# Patient Record
Sex: Male | Born: 1960
Health system: Southern US, Community
[De-identification: ages and names within clinical notes are randomized; demographics above are authoritative.]

## PROBLEM LIST (undated history)

## (undated) DIAGNOSIS — K922 Gastrointestinal hemorrhage, unspecified: Secondary | ICD-10-CM

## (undated) DIAGNOSIS — I10 Essential (primary) hypertension: Secondary | ICD-10-CM

## (undated) DIAGNOSIS — E7800 Pure hypercholesterolemia, unspecified: Secondary | ICD-10-CM

## (undated) DIAGNOSIS — C8290 Follicular lymphoma, unspecified, unspecified site: Secondary | ICD-10-CM

## (undated) DIAGNOSIS — K37 Unspecified appendicitis: Secondary | ICD-10-CM

## (undated) DIAGNOSIS — C8590 Non-Hodgkin lymphoma, unspecified, unspecified site: Secondary | ICD-10-CM

## (undated) DIAGNOSIS — Z87891 Personal history of nicotine dependence: Secondary | ICD-10-CM

## (undated) HISTORY — DX: Essential (primary) hypertension: I10

---
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
## 2000-04-28 ENCOUNTER — Emergency Department (HOSPITAL_COMMUNITY): Admission: EM | Admit: 2000-04-28 | Discharge: 2000-04-28 | Payer: Self-pay | Admitting: *Deleted

## 2008-01-13 ENCOUNTER — Observation Stay (HOSPITAL_COMMUNITY): Admission: EM | Admit: 2008-01-13 | Discharge: 2008-01-14 | Payer: Self-pay | Admitting: Emergency Medicine

## 2008-01-28 ENCOUNTER — Encounter: Admission: RE | Admit: 2008-01-28 | Discharge: 2008-04-27 | Payer: Self-pay | Admitting: *Deleted

## 2008-03-09 ENCOUNTER — Encounter: Payer: Self-pay | Admitting: Gastroenterology

## 2008-03-14 ENCOUNTER — Encounter: Payer: Self-pay | Admitting: Gastroenterology

## 2008-10-25 ENCOUNTER — Ambulatory Visit: Payer: Self-pay | Admitting: Gastroenterology

## 2008-10-26 ENCOUNTER — Telehealth: Payer: Self-pay | Admitting: Gastroenterology

## 2008-11-10 ENCOUNTER — Encounter: Payer: Self-pay | Admitting: Gastroenterology

## 2008-11-16 ENCOUNTER — Ambulatory Visit: Payer: Self-pay | Admitting: Gastroenterology

## 2008-11-16 LAB — CONVERTED CEMR LAB: Creatinine, Ser: 0.7 mg/dL (ref 0.4–1.5)

## 2008-11-17 ENCOUNTER — Encounter: Payer: Self-pay | Admitting: Gastroenterology

## 2008-11-17 ENCOUNTER — Encounter (INDEPENDENT_AMBULATORY_CARE_PROVIDER_SITE_OTHER): Payer: Self-pay | Admitting: *Deleted

## 2008-11-21 ENCOUNTER — Ambulatory Visit: Payer: Self-pay | Admitting: Internal Medicine

## 2008-11-21 IMAGING — CT CT CHEST W/ CM
1 of 3 series · 14 of 30 positions shown, 18 images · IV contrast (agent unspecified)
Comparison: None

CT CHEST

CLINICAL DATA: Abdominal pain, history of prior CT showing
adenopathy.  No prior CT is currently available for comparison.

CT CHEST, ABDOMEN AND PELVIS WITH CONTRAST
TECHNIQUE: Multidetector CT imaging of the chest, abdomen and
pelvis was performed following the standard protocol during bolus
administration of intravenous contrast.
Contrast: 125 ml [SD]

[Series 2: cap 5.0 b40f st · axial · 0.69mm/px · z∈[-602,-98]mm · 14 of 119 slices shown, 18 images]
[im 9/119  mediastinal]
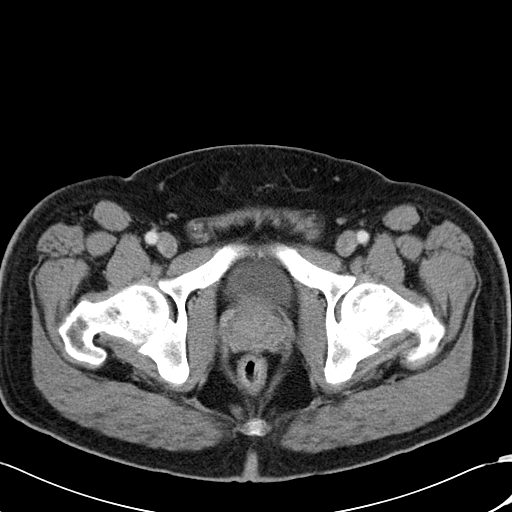
[im 9/119  lung]
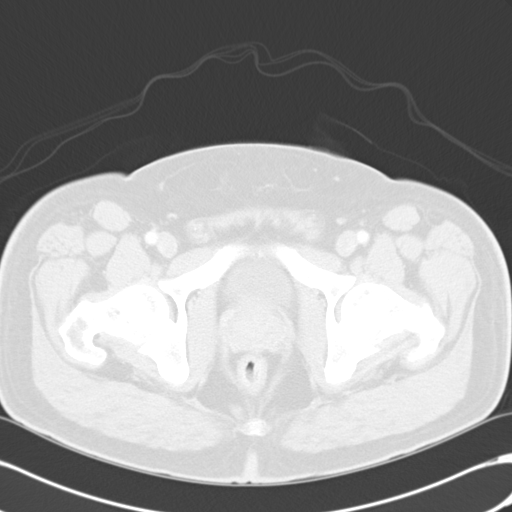
[im 17/119  lung]
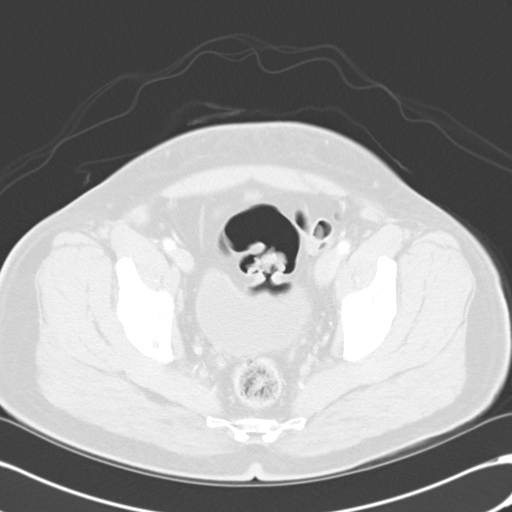
[im 26/119  lung]
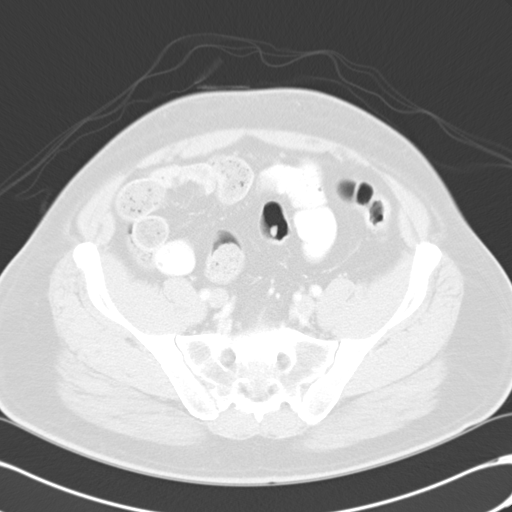
[im 34/119  lung]
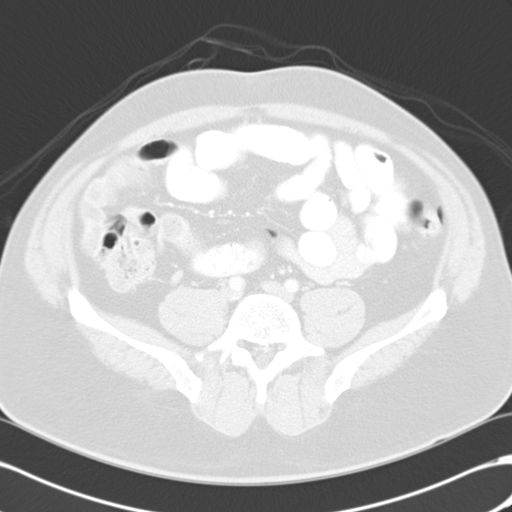
[im 43/119  mediastinal]
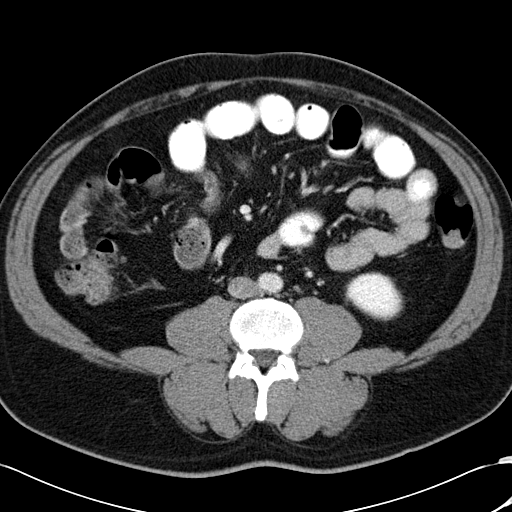
[im 43/119  lung]
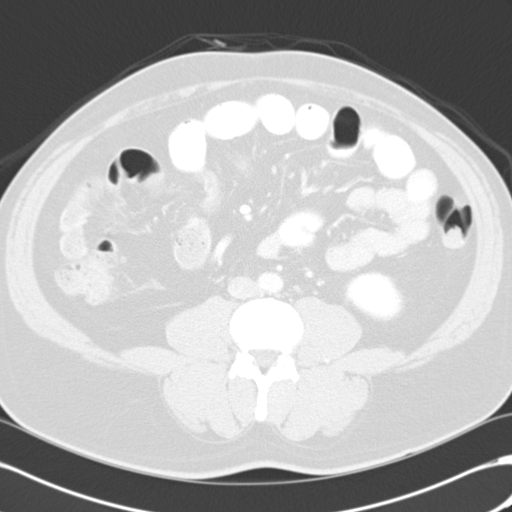
[im 51/119  lung]
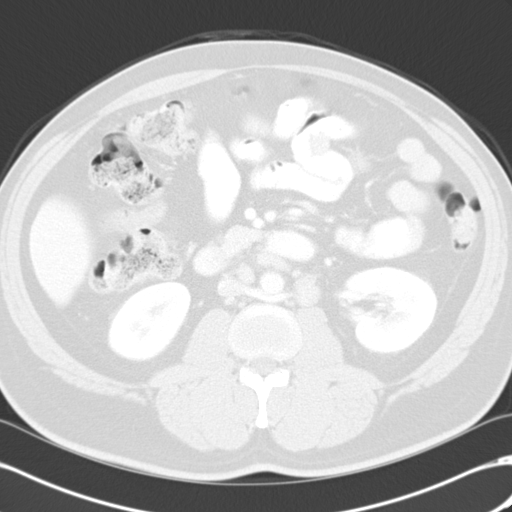
[im 58/119  lung]
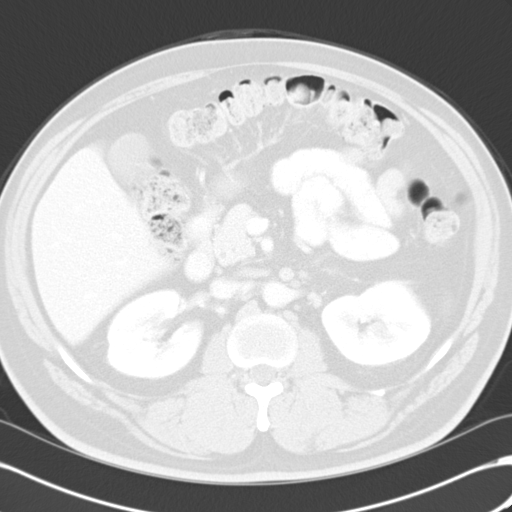
[im 60/119  lung]
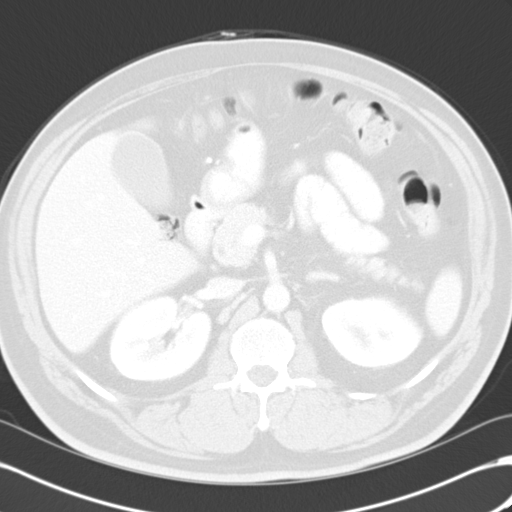
[im 68/119  mediastinal]
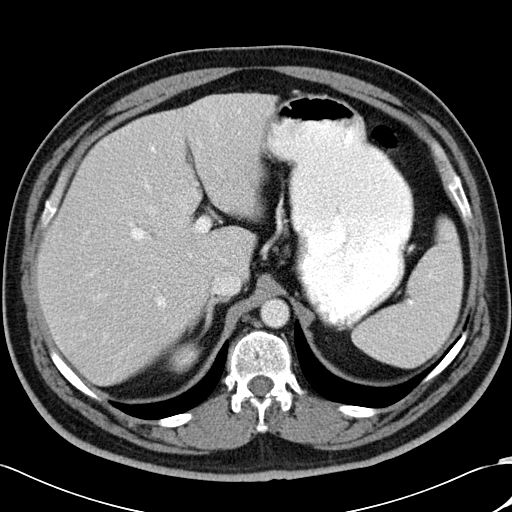
[im 68/119  lung]
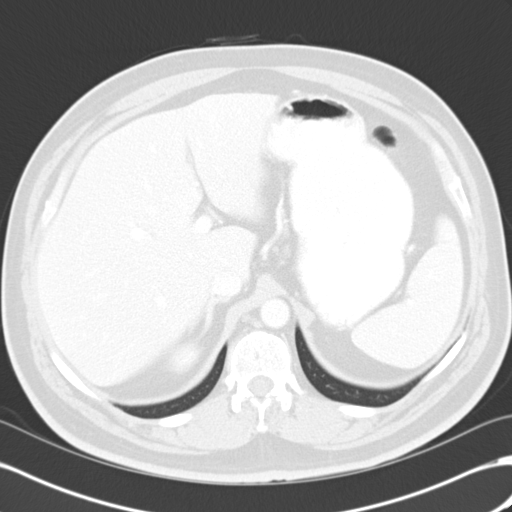
[im 76/119  lung]
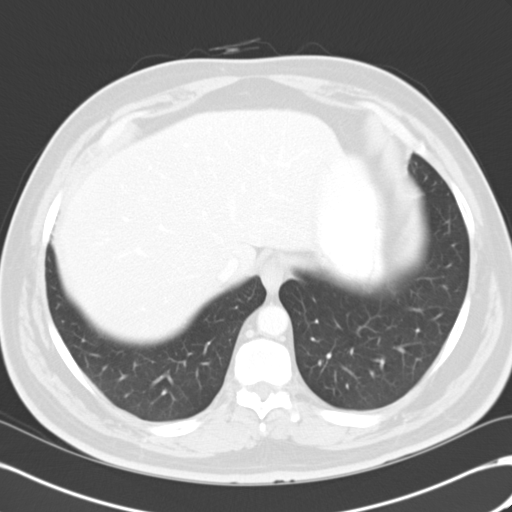
[im 85/119  lung]
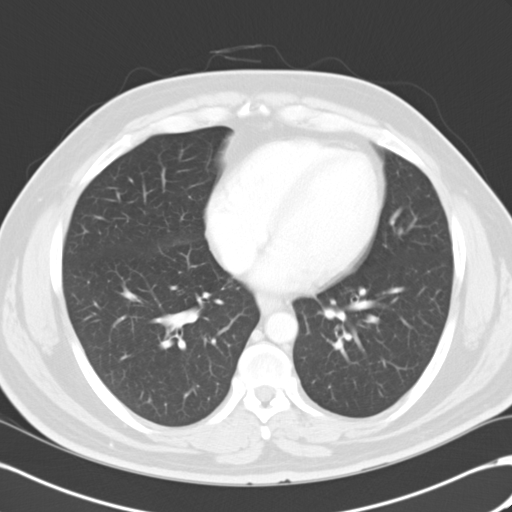
[im 93/119  lung]
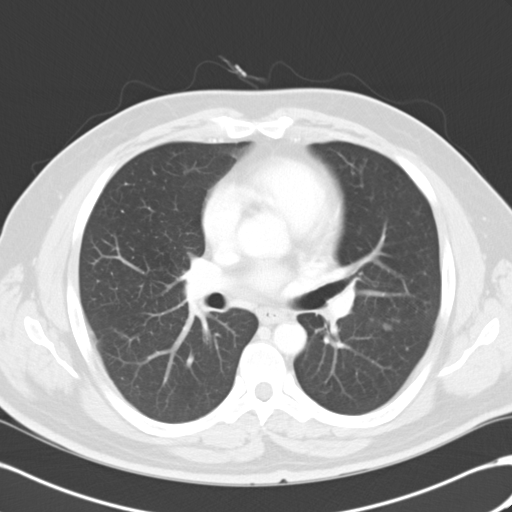
[im 102/119  mediastinal]
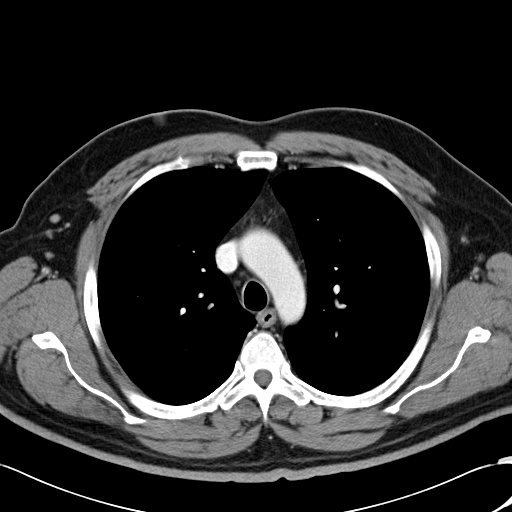
[im 102/119  lung]
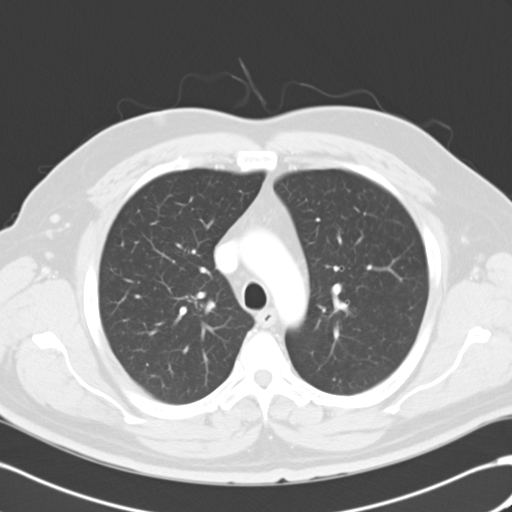
[im 110/119  lung]
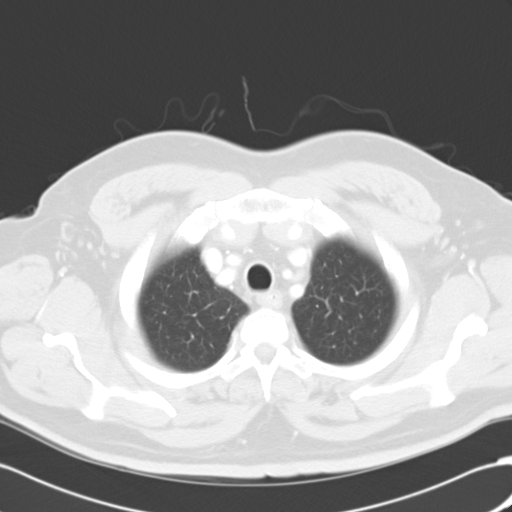

[14 of 30 positions shown; findings below may reference images not displayed]

FINDINGS: A calcified granuloma is present within the right middle
lobe.  No suspicious lung nodule is seen and no infiltrate or
effusion is noted.  Calcified right hilar nodes are present, most
likely due to prior granulomatous disease.  There are a few small
mediastinal nodes and a few small axillary nodes present, none of
which appear pathologically enlarged.  The pulmonary arteries and
thoracic aorta opacify with no significant abnormality noted.  No
bony abnormality is seen to
IMPRESSION: 1.  No suspicious lung nodule or mass.  No adenopathy.
2.  Calcified right hilar nodes and calcified right middle lobe
granuloma consist with prior granulomatous disease.

CT ABDOMEN
FINDINGS: The liver enhances with no focal abnormality and no
ductal dilatation is seen.  The gallbladder wall is slightly
irregular but no definite gallstones are noted.  The pancreas is
normal in size and the pancreatic duct is not dilated.  The adrenal
glands and spleen appear normal.  The kidneys enhance and on
delayed images the pelvocaliceal systems appear normal.  The
abdominal aorta is normal in caliber.  There are prominent
retroperitoneal and mesenteric nodes present.  The larger nodes are
retroperitoneal as seen on image number 66.  A left retroperitoneal
node measures 15 x 19 mm with a right retroperitoneal node lying
just anterior to the IVC measuring 16 x 16 mm.  Direct comparison
with the prior CT is recommended to left evaluate interval change.
A cluster of nodes just below the left renal hilus also
retroperitoneal on image 70 measures 19 x 23 mm.  These nodes could
be hyperplastic, but a neoplastic process, such as lymphoma, cannot
be excluded.
IMPRESSION: There are prominent retroperitoneal nodes as described above.
Direct comparison with the prior outside CT is recommended to
assess interval change.   Neoplasm cannot be excluded, such as
lymphoma.

CT PELVIS
FINDINGS: No pelvic adenopathy is seen.  The urinary bladder is
unremarkable.  The prostate is normal in size.  No pelvic mass or
fluid is seen.  The appendix appears normal.  The terminal ileum is
grossly normal.
IMPRESSION: No significant abnormality on CT of the pelvis.  The appendix
appears normal.

## 2008-11-24 ENCOUNTER — Encounter (INDEPENDENT_AMBULATORY_CARE_PROVIDER_SITE_OTHER): Payer: Self-pay | Admitting: *Deleted

## 2008-11-27 ENCOUNTER — Encounter: Payer: Self-pay | Admitting: Gastroenterology

## 2008-12-11 ENCOUNTER — Encounter (INDEPENDENT_AMBULATORY_CARE_PROVIDER_SITE_OTHER): Payer: Self-pay | Admitting: *Deleted

## 2008-12-13 ENCOUNTER — Ambulatory Visit: Payer: Self-pay | Admitting: Gastroenterology

## 2008-12-15 ENCOUNTER — Encounter: Payer: Self-pay | Admitting: Gastroenterology

## 2008-12-19 ENCOUNTER — Ambulatory Visit (HOSPITAL_COMMUNITY): Admission: RE | Admit: 2008-12-19 | Discharge: 2008-12-19 | Payer: Self-pay | Admitting: Gastroenterology

## 2008-12-19 ENCOUNTER — Encounter (INDEPENDENT_AMBULATORY_CARE_PROVIDER_SITE_OTHER): Payer: Self-pay | Admitting: Diagnostic Radiology

## 2008-12-19 ENCOUNTER — Encounter: Payer: Self-pay | Admitting: Gastroenterology

## 2008-12-19 IMAGING — CT CT BIOPSY
1 of 3 series · 12 of 32 positions shown, 18 images · non-contrast
Comparison: none

CLINICAL HISTORY: 47-year-old male with retroperitoneal
lymphadenopathy of unknown origin.

[Series 2: abd_pel 5.0 b40f st · axial · 0.81mm/px · z∈[-176,-10]mm · 12 of 40 slices shown, 18 images]
[im 4/40  soft-tissue]
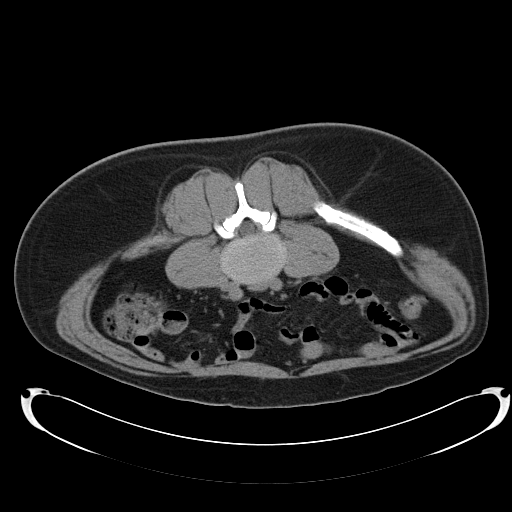
[im 4/40  bone]
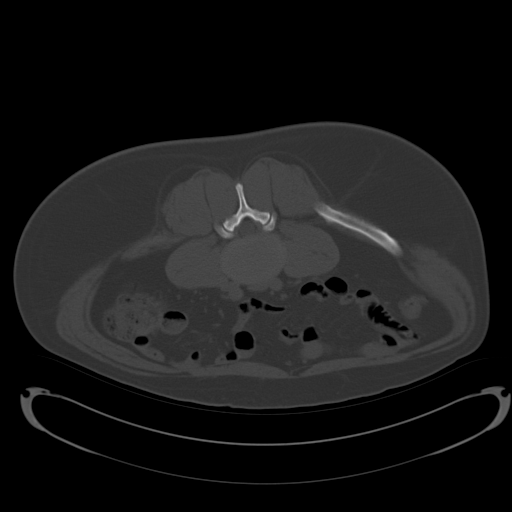
[im 7/40  soft-tissue]
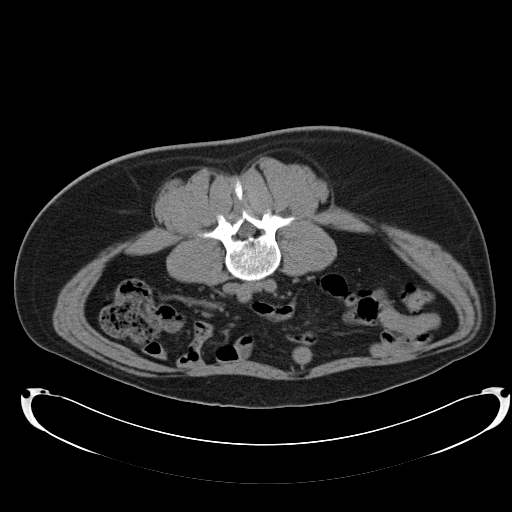
[im 10/40  soft-tissue]
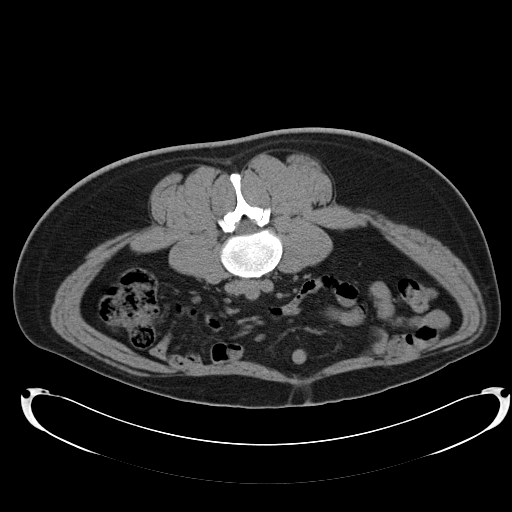
[im 13/40  soft-tissue]
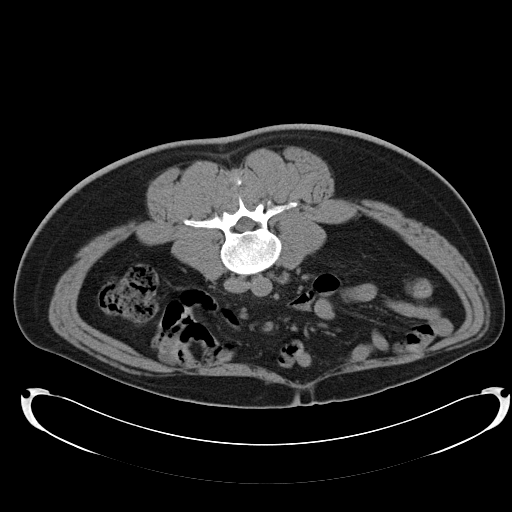
[im 16/40  soft-tissue]
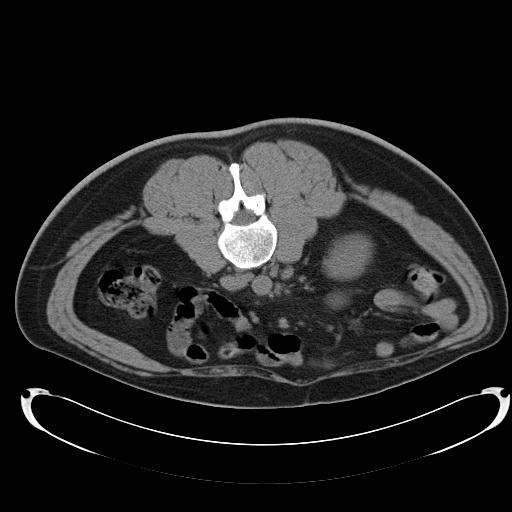
[im 19/40  soft-tissue]
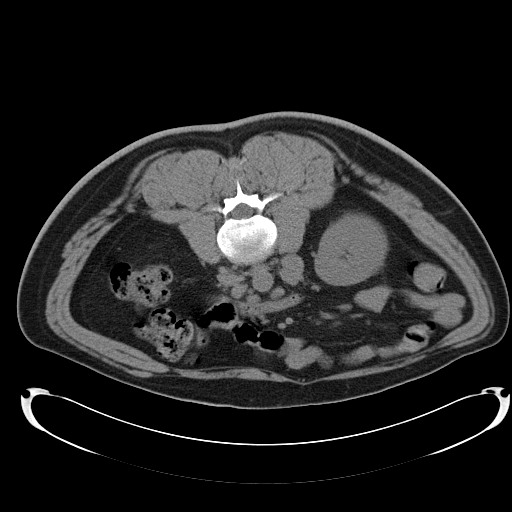
[im 22/40  soft-tissue]
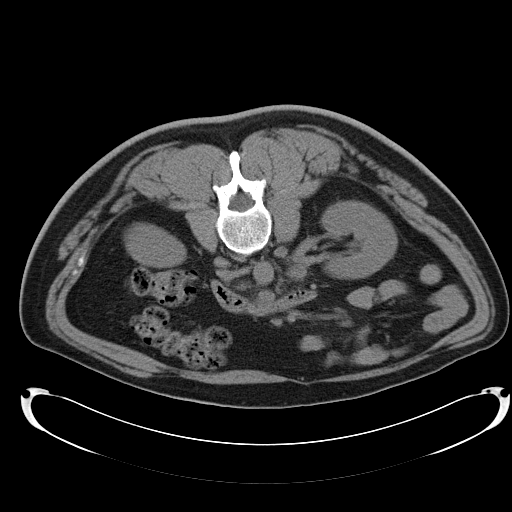
[im 25/40  soft-tissue]
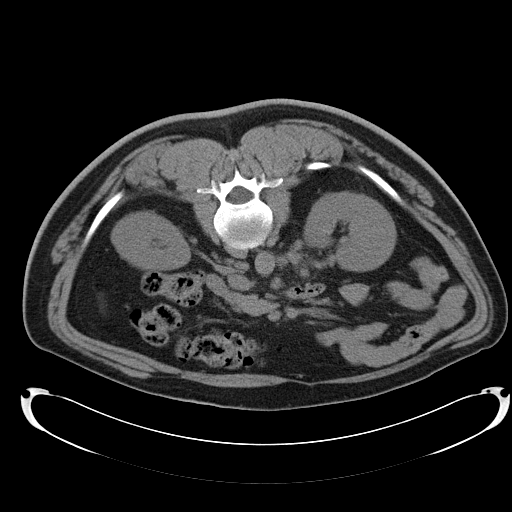
[im 28/40  soft-tissue]
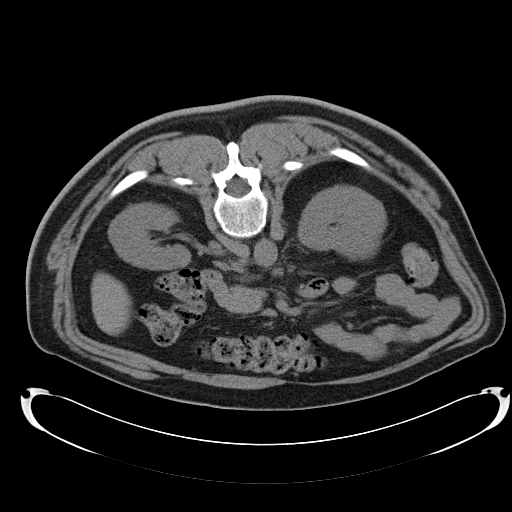
[im 28/40  lung]
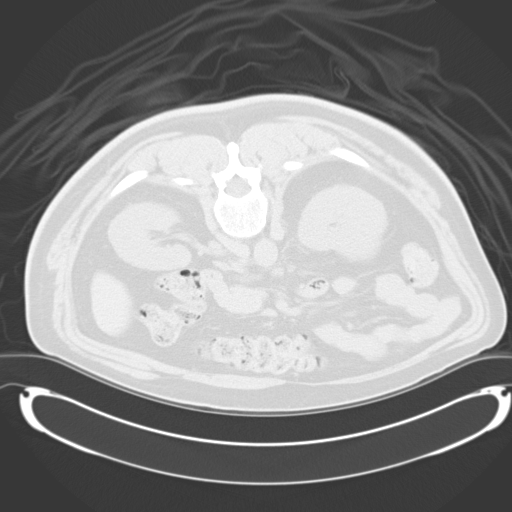
[im 28/40  bone]
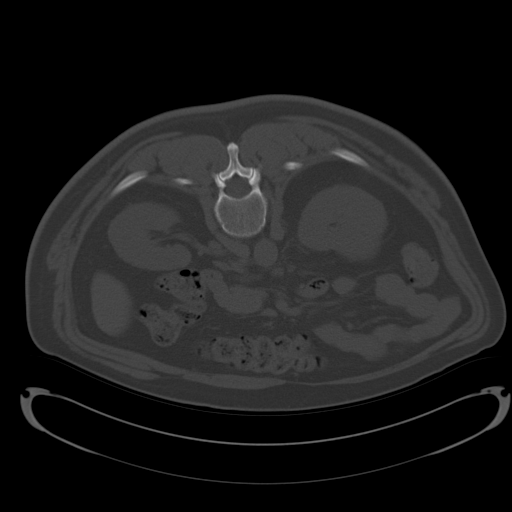
[im 31/40  soft-tissue]
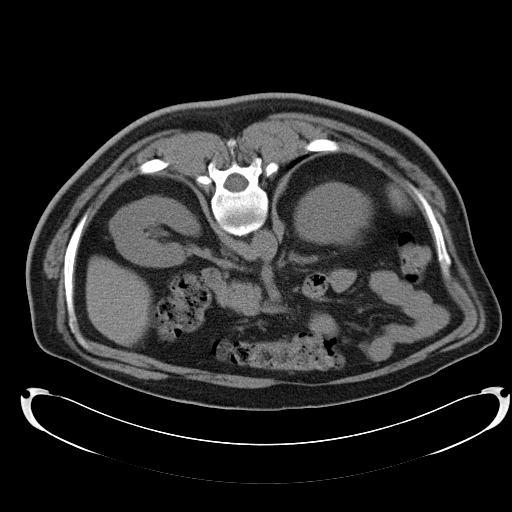
[im 31/40  lung]
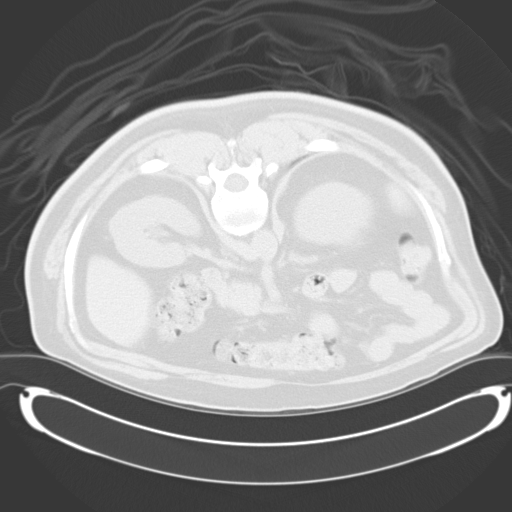
[im 34/40  soft-tissue]
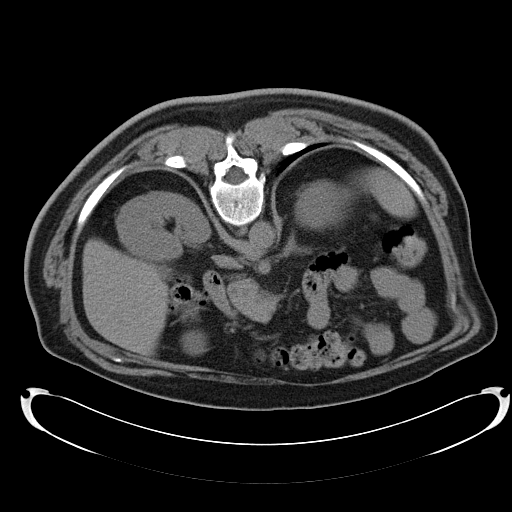
[im 34/40  lung]
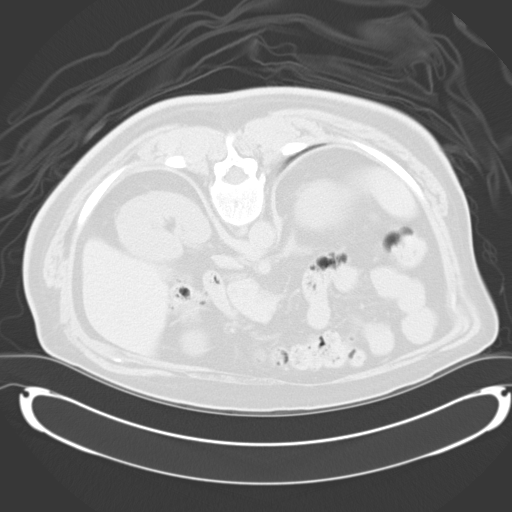
[im 37/40  soft-tissue]
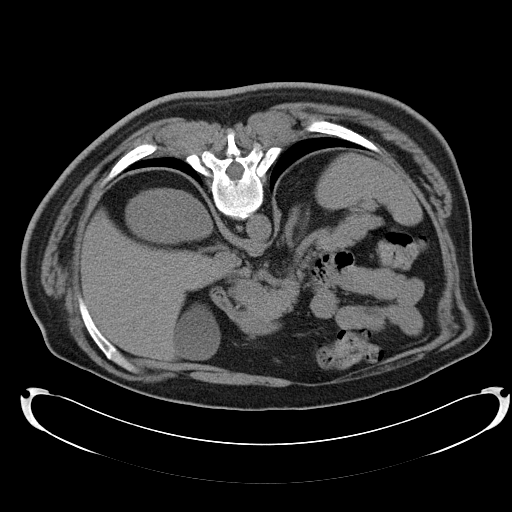
[im 37/40  lung]
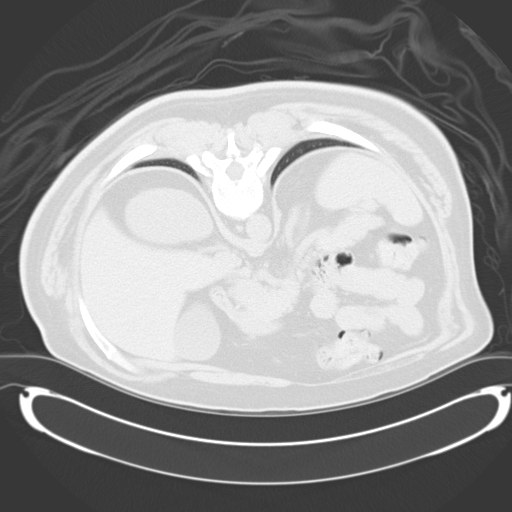

[12 of 32 positions shown; findings below may reference images not displayed]

PROCEDURE(S): CT GUIDED RETROPERITONEAL LYMPH NODE BIOPSY

Medications:Versed 2 mg, Fentanyl 100 mcg

Sedation time:25 minutes

Procedure:Consent was obtained for a retroperitoneal biopsy with a
translator.  The patient does not speak English.  The patient was
placed prone on the CT scanner.  Images through the lower abdomen
demonstrated multiple small retroperitoneal lymph nodes in the
periaortic stations.  The most accessible lymph node was just below
the left retroaortic renal vein.  The left back was prepped with
Betadine and a sterile drape was placed.  The skin was anesthetized
with 1% lidocaine.  A 17 gauge coaxial needle was directed toward
this lymph node with CT guidance.  The needle was repositioned
multiple times in order to safely obtain tissue.  A single fine
needle aspiration was obtained with a 22 gauge Chiba needle.  Three
core biopsies were obtained with an 18 gauge device.  A touch prep
was also performed from one of these core biopsies.  The needle was
removed.
FINDINGS: Multiple small lymph nodes in the periaortic
retroperitoneal structures.  Largest nodal complex is just caudal
to the left retroaortic renal vein.  Three core biopsies of the
retroperitoneal nodal complex were obtained.  These samples were
deemed adequate by pathology.
IMPRESSION: CT guided core biopsies of a left periaortic lymph node.

## 2008-12-21 ENCOUNTER — Telehealth: Payer: Self-pay | Admitting: Gastroenterology

## 2008-12-25 ENCOUNTER — Encounter: Payer: Self-pay | Admitting: Gastroenterology

## 2008-12-26 ENCOUNTER — Telehealth: Payer: Self-pay | Admitting: Gastroenterology

## 2008-12-27 ENCOUNTER — Ambulatory Visit: Payer: Self-pay | Admitting: Gastroenterology

## 2008-12-27 ENCOUNTER — Ambulatory Visit: Payer: Self-pay | Admitting: Internal Medicine

## 2008-12-27 DIAGNOSIS — C8290 Follicular lymphoma, unspecified, unspecified site: Secondary | ICD-10-CM

## 2008-12-27 HISTORY — DX: Follicular lymphoma, unspecified, unspecified site: C82.90

## 2008-12-28 ENCOUNTER — Telehealth: Payer: Self-pay | Admitting: Gastroenterology

## 2009-01-02 ENCOUNTER — Encounter: Payer: Self-pay | Admitting: Gastroenterology

## 2009-01-09 ENCOUNTER — Encounter: Payer: Self-pay | Admitting: Internal Medicine

## 2009-01-09 ENCOUNTER — Ambulatory Visit: Payer: Self-pay

## 2009-01-11 ENCOUNTER — Ambulatory Visit (HOSPITAL_COMMUNITY): Admission: RE | Admit: 2009-01-11 | Discharge: 2009-01-11 | Payer: Self-pay | Admitting: Internal Medicine

## 2009-01-11 IMAGING — CT NM PET TUM IMG INITIAL (PI) SKULL BASE T - THIGH
6 series · 25 of 25 positions shown · IV contrast ([ID])
Comparison: None

CLINICAL DATA: Initial treatment strategy for lymphoma.

NUCLEAR MEDICINE PET CT INITIAL (PI) SKULL BASE TO THIGH
TECHNIQUE: 18.7 mCi F-18 FDG was injected intravenously via the
right arm.  Full-ring PET imaging was performed from the skull base
through the mid-thighs 67  minutes after injection.  CT data was
obtained and used for attenuation correction and anatomic
localization only.  (This was not acquired as a diagnostic CT
examination.)
Fasting Blood Glucose:  131

[Series 1: pet ac · axial · 3.3mm · 4.69mm/px · z∈[-870,+0]mm · 5 of 267 slices shown]
[im 1/267]
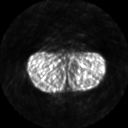
[im 67/267]
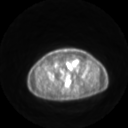
[im 134/267]
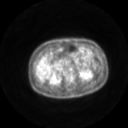
[im 200/267]
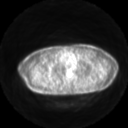
[im 267/267]
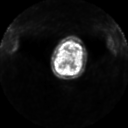

[Series 2: ct images · axial · 3.8mm · 0.98mm/px · z∈[-870,+0]mm · 5 of 267 slices shown]
[im 1/267]
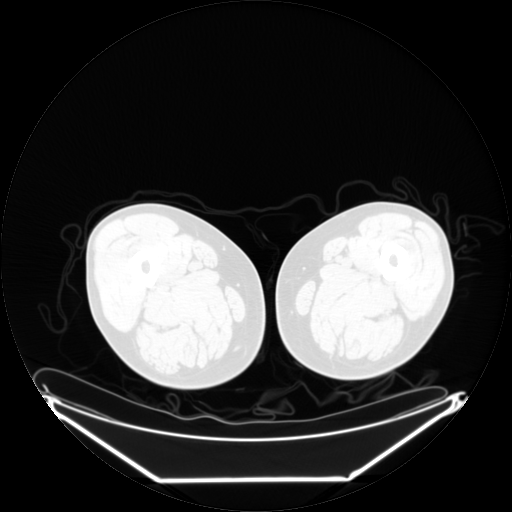
[im 67/267]
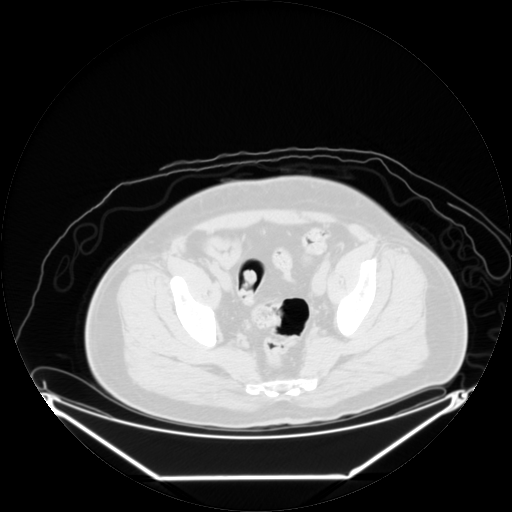
[im 134/267]
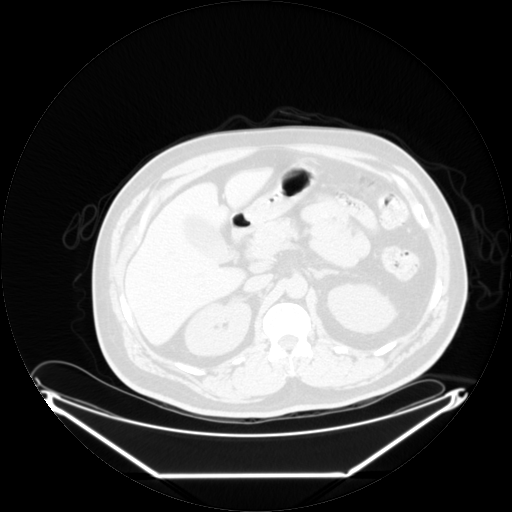
[im 200/267]
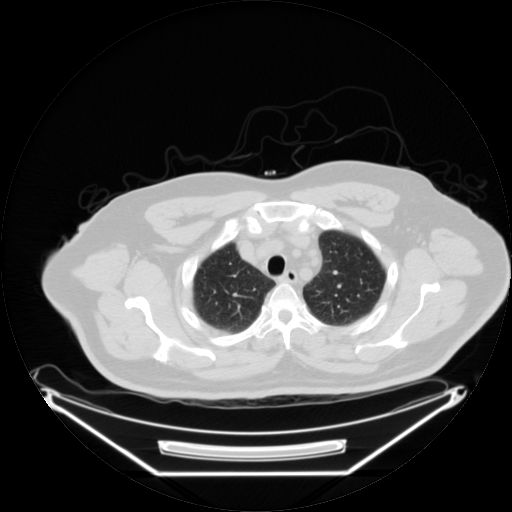
[im 267/267  brain]
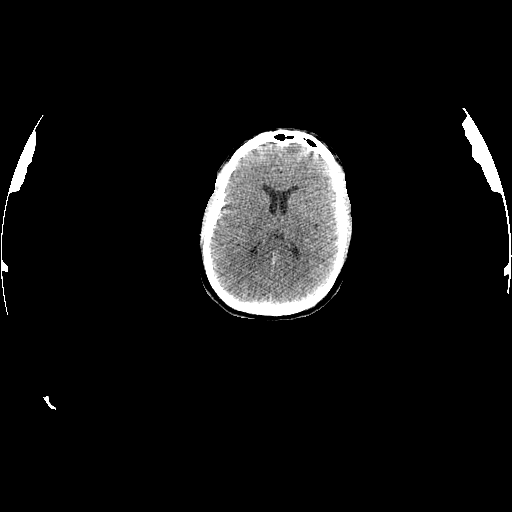

[Series 2: pet nac · axial · 3.3mm · 4.69mm/px · z∈[-870,+0]mm · 6 of 267 slices shown]
[im 1/267]
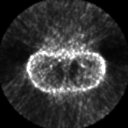
[im 54/267]
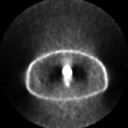
[im 107/267]
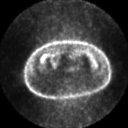
[im 160/267]
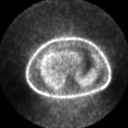
[im 213/267]
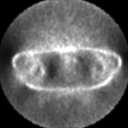
[im 267/267]
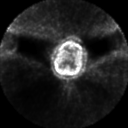

[Series 123: mip · coronal · 3.3mm · 4.69mm/px · 1 of 30 slices shown]
[im 1/30]
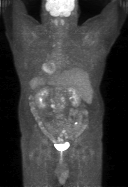

[Series 151: reformatted · axial · 3.3mm · 3.91mm/px · z∈[-870,+0]mm · 6 of 265 slices shown (1 of 2)]
[im 1/265]
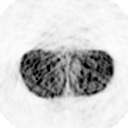
[im 53/265]
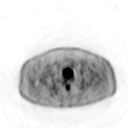
[im 106/265]
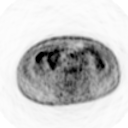
[im 159/265]
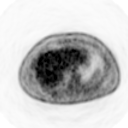
[im 212/265]
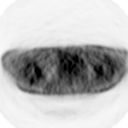
[im 265/265]
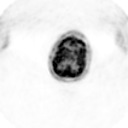

[Series 153: reformatted · coronal · 4.7mm · 6.98mm/px · 2 of 71 slices shown (2 of 2)]
[im 1/71]
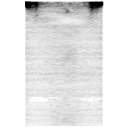
[im 71/71]
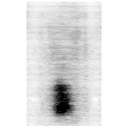

[25 of 25 positions shown; findings below may reference images not displayed]

FINDINGS: In the neck, there is hypermetabolic activity throughout
Waldeyer's lymphatic ring in the naso and oropharynx. There is also
a focus of hypermetabolic activity in the right submandibular
region, which corresponds with a small less than 1 cm submandibular
lymph node on image 40, which is not pathologically enlarged.

There is mild hypermetabolic activity within a small, less than 1
cm, left supraclavicular lymph node on image 53.

Hypermetabolic uptake is seen within mild mediastinal adenopathy in
the lateral aortic region.  This has a SUV max of 4.8.  No other
hypermetabolic adenopathy or soft tissue masses are seen within the
thorax.

In the abdomen, hypermetabolic adenopathy is seen throughout the
retroperitoneum in the lateral aortic and aortocaval regions and
portacaval space.  Index adenopathy in the left periaortic region
on image 152 has a SUV max of 11.2.

No hypermetabolic adenopathy are soft tissue masses are identified
within the pelvis.
IMPRESSION: 1.  Hypermetabolic adenopathy within the abdominal retroperitoneum
and mediastinum.
2.  Hypermetabolic activity within tiny left supraclavicular and
right submandibular lymph nodes, which are not pathologically
enlarged.
3.  Diffuse hypermetabolic activity throughout Waldeyer's lymphatic
ring of the pharynx.

REF:W2 DICTATED: [DATE] [DATE]

## 2009-01-12 ENCOUNTER — Ambulatory Visit (HOSPITAL_COMMUNITY): Admission: RE | Admit: 2009-01-12 | Discharge: 2009-01-12 | Payer: Self-pay | Admitting: Internal Medicine

## 2009-01-12 ENCOUNTER — Telehealth: Payer: Self-pay | Admitting: Gastroenterology

## 2009-01-12 ENCOUNTER — Ambulatory Visit: Payer: Self-pay | Admitting: Internal Medicine

## 2009-01-12 ENCOUNTER — Encounter: Payer: Self-pay | Admitting: Internal Medicine

## 2009-01-15 ENCOUNTER — Encounter: Payer: Self-pay | Admitting: Gastroenterology

## 2009-01-15 LAB — CBC WITH DIFFERENTIAL/PLATELET
Basophils Absolute: 0 10*3/uL (ref 0.0–0.1)
EOS%: 2.4 % (ref 0.0–7.0)
Eosinophils Absolute: 0.2 10*3/uL (ref 0.0–0.5)
HCT: 43 % (ref 38.7–49.9)
HGB: 15 g/dL (ref 13.0–17.1)
LYMPH%: 26.3 % (ref 14.0–48.0)
MCH: 30.6 pg (ref 28.0–33.4)
MCV: 87.3 fL (ref 81.6–98.0)
MONO%: 6.9 % (ref 0.0–13.0)
NEUT#: 5.5 10*3/uL (ref 1.5–6.5)
NEUT%: 64.2 % (ref 40.0–75.0)
Platelets: 235 10*3/uL (ref 145–400)

## 2009-01-15 LAB — URIC ACID: Uric Acid, Serum: 5.9 mg/dL (ref 4.0–7.8)

## 2009-01-15 LAB — COMPREHENSIVE METABOLIC PANEL
AST: 13 U/L (ref 0–37)
Albumin: 4.2 g/dL (ref 3.5–5.2)
Alkaline Phosphatase: 60 U/L (ref 39–117)
BUN: 15 mg/dL (ref 6–23)
Creatinine, Ser: 0.92 mg/dL (ref 0.40–1.50)
Glucose, Bld: 199 mg/dL — ABNORMAL HIGH (ref 70–99)
Potassium: 4.5 mEq/L (ref 3.5–5.3)

## 2009-01-22 ENCOUNTER — Encounter: Payer: Self-pay | Admitting: Gastroenterology

## 2009-01-22 LAB — COMPREHENSIVE METABOLIC PANEL
AST: 9 U/L (ref 0–37)
Albumin: 4.3 g/dL (ref 3.5–5.2)
BUN: 17 mg/dL (ref 6–23)
Calcium: 9.5 mg/dL (ref 8.4–10.5)
Chloride: 98 mEq/L (ref 96–112)
Creatinine, Ser: 0.84 mg/dL (ref 0.40–1.50)
Glucose, Bld: 164 mg/dL — ABNORMAL HIGH (ref 70–99)
Potassium: 4.4 mEq/L (ref 3.5–5.3)

## 2009-01-22 LAB — CBC WITH DIFFERENTIAL/PLATELET
BASO%: 0.3 % (ref 0.0–2.0)
Eosinophils Absolute: 0.3 10*3/uL (ref 0.0–0.5)
HCT: 40.4 % (ref 38.7–49.9)
MCHC: 35.4 g/dL (ref 32.0–35.9)
MONO#: 0.4 10*3/uL (ref 0.1–0.9)
NEUT#: 6.1 10*3/uL (ref 1.5–6.5)
NEUT%: 66 % (ref 40.0–75.0)
Platelets: 178 10*3/uL (ref 145–400)
RBC: 4.65 10*6/uL (ref 4.20–5.71)
WBC: 9.2 10*3/uL (ref 4.0–10.0)
lymph#: 2.3 10*3/uL (ref 0.9–3.3)

## 2009-01-22 LAB — URIC ACID: Uric Acid, Serum: 5.3 mg/dL (ref 4.0–7.8)

## 2009-01-22 LAB — LACTATE DEHYDROGENASE: LDH: 221 U/L (ref 94–250)

## 2009-01-30 LAB — CBC WITH DIFFERENTIAL/PLATELET
BASO%: 0.5 % (ref 0.0–2.0)
Eosinophils Absolute: 0.1 10*3/uL (ref 0.0–0.5)
LYMPH%: 22.6 % (ref 14.0–49.0)
MCHC: 34.9 g/dL (ref 32.0–36.0)
MONO#: 1.1 10*3/uL — ABNORMAL HIGH (ref 0.1–0.9)
NEUT#: 6.3 10*3/uL (ref 1.5–6.5)
RBC: 4.54 10*6/uL (ref 4.20–5.82)
RDW: 13.3 % (ref 11.0–14.6)
WBC: 9.8 10*3/uL (ref 4.0–10.3)
lymph#: 2.2 10*3/uL (ref 0.9–3.3)

## 2009-01-30 LAB — COMPREHENSIVE METABOLIC PANEL
ALT: 15 U/L (ref 0–53)
Albumin: 4.2 g/dL (ref 3.5–5.2)
CO2: 24 mEq/L (ref 19–32)
Glucose, Bld: 127 mg/dL — ABNORMAL HIGH (ref 70–99)
Potassium: 4.3 mEq/L (ref 3.5–5.3)
Sodium: 136 mEq/L (ref 135–145)
Total Bilirubin: 0.3 mg/dL (ref 0.3–1.2)
Total Protein: 6.6 g/dL (ref 6.0–8.3)

## 2009-01-30 LAB — LACTATE DEHYDROGENASE: LDH: 158 U/L (ref 94–250)

## 2009-02-05 ENCOUNTER — Encounter: Payer: Self-pay | Admitting: Gastroenterology

## 2009-02-05 LAB — COMPREHENSIVE METABOLIC PANEL
Albumin: 4.1 g/dL (ref 3.5–5.2)
CO2: 23 mEq/L (ref 19–32)
Calcium: 9.4 mg/dL (ref 8.4–10.5)
Glucose, Bld: 143 mg/dL — ABNORMAL HIGH (ref 70–99)
Potassium: 4.5 mEq/L (ref 3.5–5.3)
Sodium: 137 mEq/L (ref 135–145)
Total Bilirubin: 0.5 mg/dL (ref 0.3–1.2)
Total Protein: 6.6 g/dL (ref 6.0–8.3)

## 2009-02-05 LAB — LACTATE DEHYDROGENASE: LDH: 143 U/L (ref 94–250)

## 2009-02-05 LAB — CBC WITH DIFFERENTIAL/PLATELET
Eosinophils Absolute: 0.1 10*3/uL (ref 0.0–0.5)
LYMPH%: 22.1 % (ref 14.0–49.0)
MONO#: 1 10*3/uL — ABNORMAL HIGH (ref 0.1–0.9)
NEUT#: 4.8 10*3/uL (ref 1.5–6.5)
Platelets: 292 10*3/uL (ref 140–400)
RBC: 4.71 10*6/uL (ref 4.20–5.82)
WBC: 7.5 10*3/uL (ref 4.0–10.3)

## 2009-02-05 LAB — URIC ACID: Uric Acid, Serum: 5.9 mg/dL (ref 4.0–7.8)

## 2009-02-06 LAB — WHOLE BLOOD GLUCOSE: Glucose: 206 mg/dL — ABNORMAL HIGH (ref 70–100)

## 2009-02-13 ENCOUNTER — Ambulatory Visit: Payer: Self-pay | Admitting: Internal Medicine

## 2009-02-13 LAB — CBC WITH DIFFERENTIAL/PLATELET
Basophils Absolute: 0 10*3/uL (ref 0.0–0.1)
Eosinophils Absolute: 0.3 10*3/uL (ref 0.0–0.5)
HGB: 14.5 g/dL (ref 13.0–17.1)
MCV: 87.4 fL (ref 79.3–98.0)
MONO#: 1.3 10*3/uL — ABNORMAL HIGH (ref 0.1–0.9)
MONO%: 10.7 % (ref 0.0–14.0)
NEUT#: 9.2 10*3/uL — ABNORMAL HIGH (ref 1.5–6.5)
RBC: 4.78 10*6/uL (ref 4.20–5.82)
RDW: 13 % (ref 11.0–14.6)
WBC: 12.6 10*3/uL — ABNORMAL HIGH (ref 4.0–10.3)
lymph#: 1.6 10*3/uL (ref 0.9–3.3)

## 2009-02-13 LAB — COMPREHENSIVE METABOLIC PANEL
Albumin: 4.3 g/dL (ref 3.5–5.2)
Alkaline Phosphatase: 129 U/L — ABNORMAL HIGH (ref 39–117)
BUN: 10 mg/dL (ref 6–23)
CO2: 23 mEq/L (ref 19–32)
Calcium: 9.6 mg/dL (ref 8.4–10.5)
Chloride: 101 mEq/L (ref 96–112)
Glucose, Bld: 117 mg/dL — ABNORMAL HIGH (ref 70–99)
Potassium: 4.5 mEq/L (ref 3.5–5.3)
Sodium: 135 mEq/L (ref 135–145)
Total Protein: 6.9 g/dL (ref 6.0–8.3)

## 2009-02-13 LAB — URIC ACID: Uric Acid, Serum: 6.1 mg/dL (ref 4.0–7.8)

## 2009-02-20 ENCOUNTER — Ambulatory Visit (HOSPITAL_COMMUNITY): Admission: RE | Admit: 2009-02-20 | Discharge: 2009-02-20 | Payer: Self-pay | Admitting: Internal Medicine

## 2009-02-20 LAB — COMPREHENSIVE METABOLIC PANEL
Albumin: 4 g/dL (ref 3.5–5.2)
Alkaline Phosphatase: 95 U/L (ref 39–117)
BUN: 10 mg/dL (ref 6–23)
Glucose, Bld: 123 mg/dL — ABNORMAL HIGH (ref 70–99)
Potassium: 4.3 mEq/L (ref 3.5–5.3)
Total Bilirubin: 0.4 mg/dL (ref 0.3–1.2)

## 2009-02-20 LAB — CBC WITH DIFFERENTIAL/PLATELET
BASO%: 0.5 % (ref 0.0–2.0)
EOS%: 2.5 % (ref 0.0–7.0)
HCT: 41.4 % (ref 38.4–49.9)
LYMPH%: 14 % (ref 14.0–49.0)
MCH: 30.8 pg (ref 27.2–33.4)
MCHC: 34.8 g/dL (ref 32.0–36.0)
MONO#: 1.1 10*3/uL — ABNORMAL HIGH (ref 0.1–0.9)
NEUT%: 74 % (ref 39.0–75.0)
RBC: 4.67 10*6/uL (ref 4.20–5.82)
WBC: 12.7 10*3/uL — ABNORMAL HIGH (ref 4.0–10.3)
lymph#: 1.8 10*3/uL (ref 0.9–3.3)

## 2009-02-20 LAB — URIC ACID: Uric Acid, Serum: 5.4 mg/dL (ref 4.0–7.8)

## 2009-02-20 IMAGING — CT CT ABDOMEN W/ CM
2 of 5 series · 16 of 46 positions shown, 18 images · IV contrast (agent unspecified)
Comparison: Prior CTs [DATE] and PET CTs dated [DATE] and
today.

CT CHEST

CLINICAL DATA: Hodgkin's lymphoma diagnosed in [DATE].
Restaging.  Chemotherapy ongoing.

CT CHEST, ABDOMEN AND PELVIS WITH CONTRAST
TECHNIQUE: Multidetector CT imaging of the chest, abdomen and
pelvis was performed following the standard protocol during bolus
administration of intravenous contrast.
Contrast: 100 ml [OD] intravenously.

[Series 2: cap with st · axial · 0.82mm/px · z∈[-656,-112]mm · 13 of 125 slices shown, 15 images]
[im 8/125  soft-tissue]
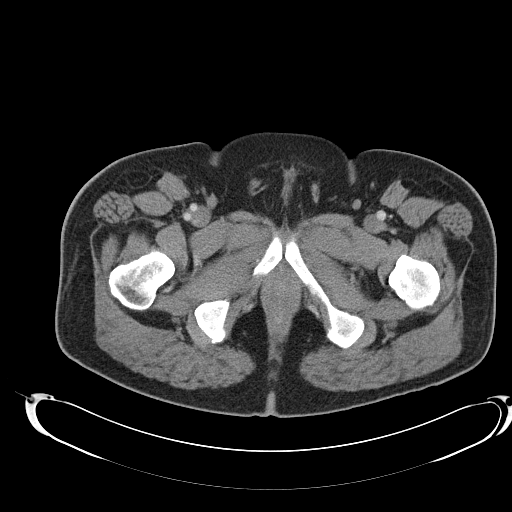
[im 8/125  bone]
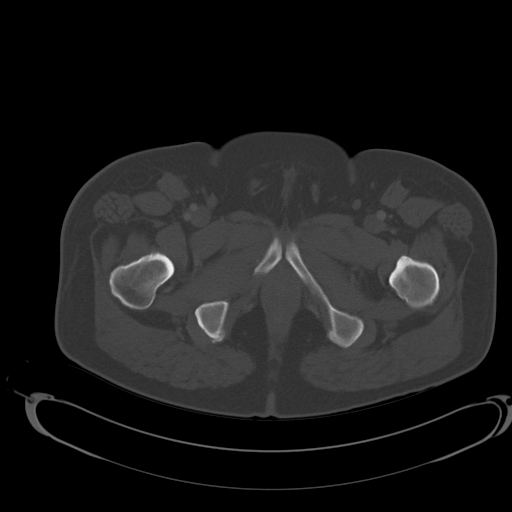
[im 15/125  soft-tissue]
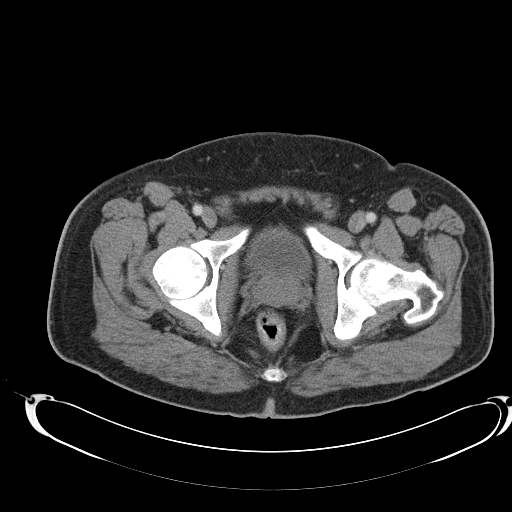
[im 30/125  soft-tissue]
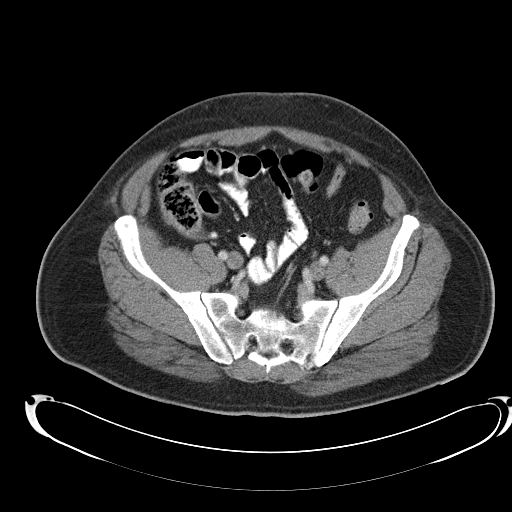
[im 37/125  soft-tissue]
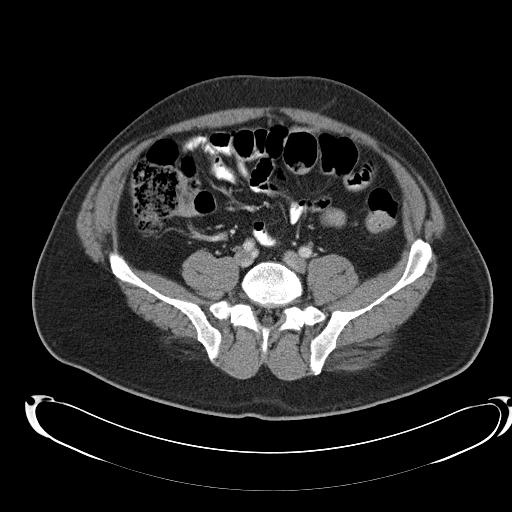
[im 44/125  soft-tissue]
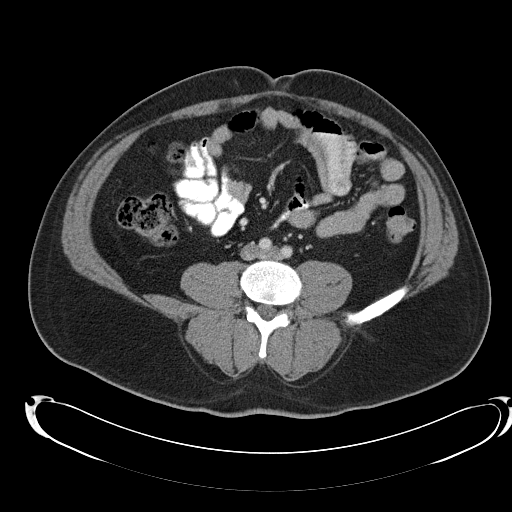
[im 52/125  soft-tissue]
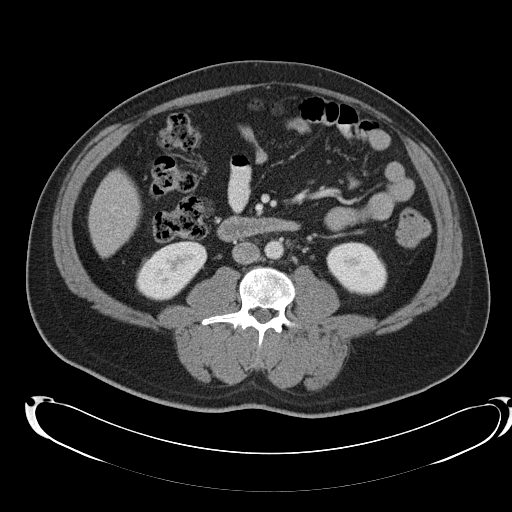
[im 66/125  soft-tissue]
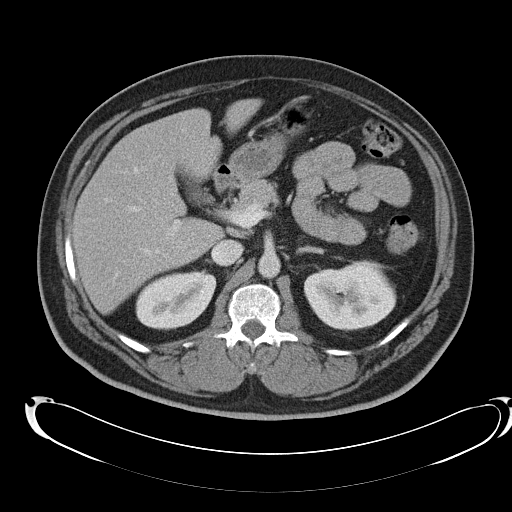
[im 73/125  soft-tissue]
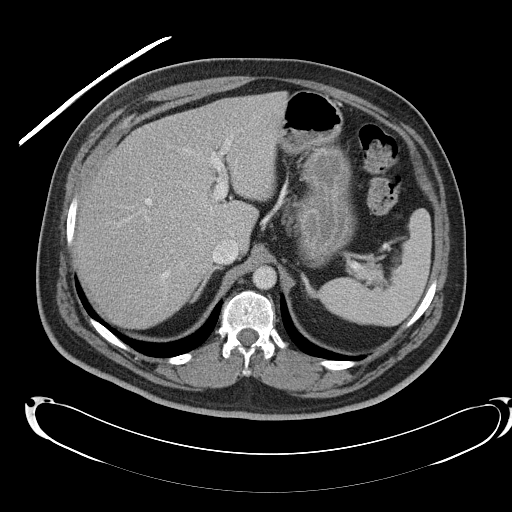
[im 81/125  soft-tissue]
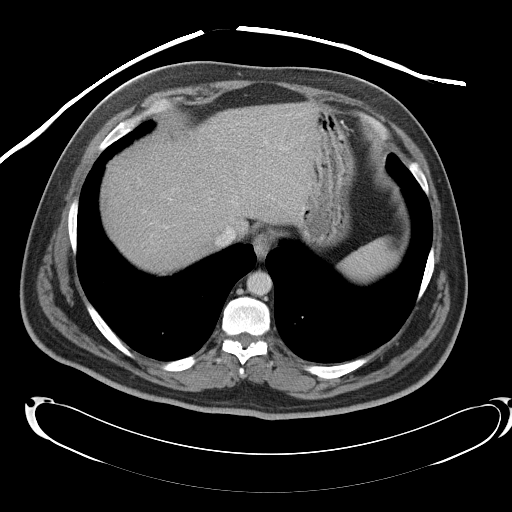
[im 81/125  bone]
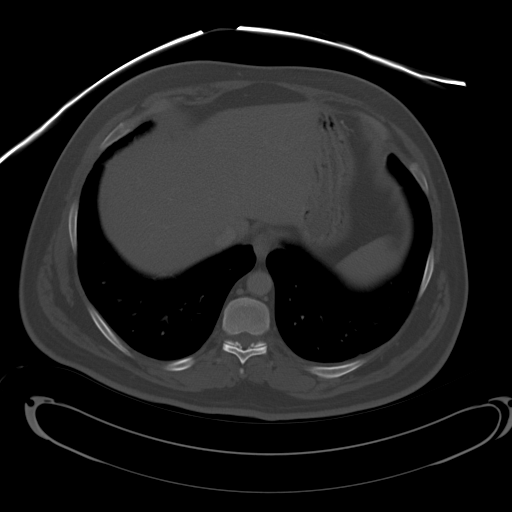
[im 88/125  soft-tissue]
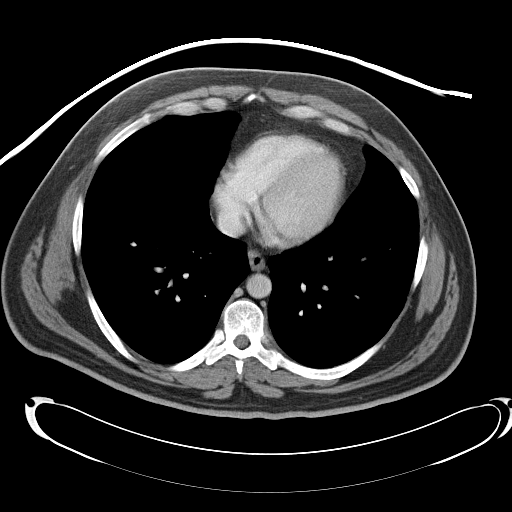
[im 95/125  soft-tissue]
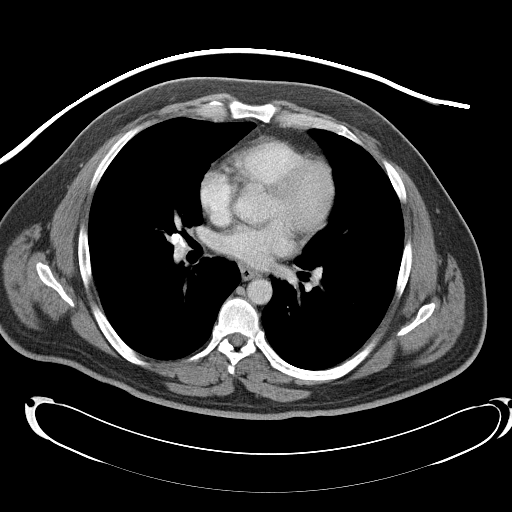
[im 110/125  soft-tissue]
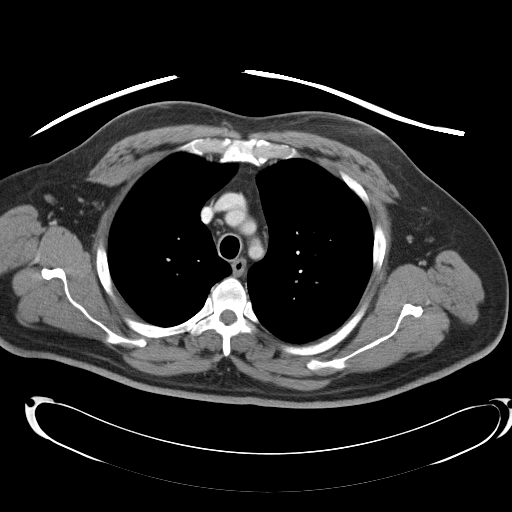
[im 117/125  soft-tissue]
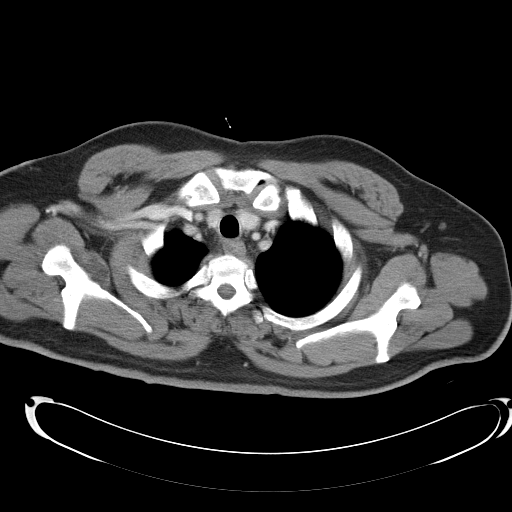

[Series 602: <mpr thick range> · coronal · 1.22mm/px · 3 of 92 slices shown]
[im 31/92  soft-tissue]
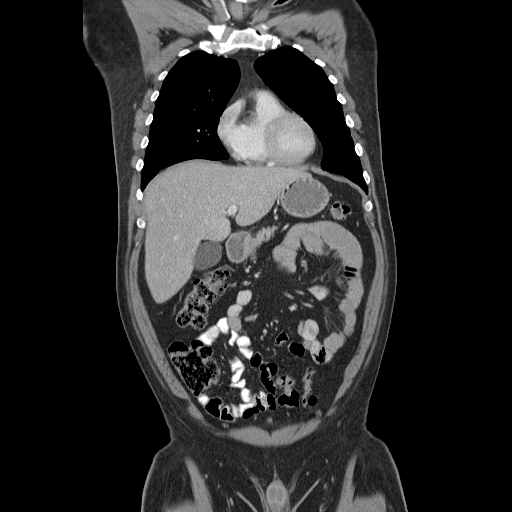
[im 41/92  soft-tissue]
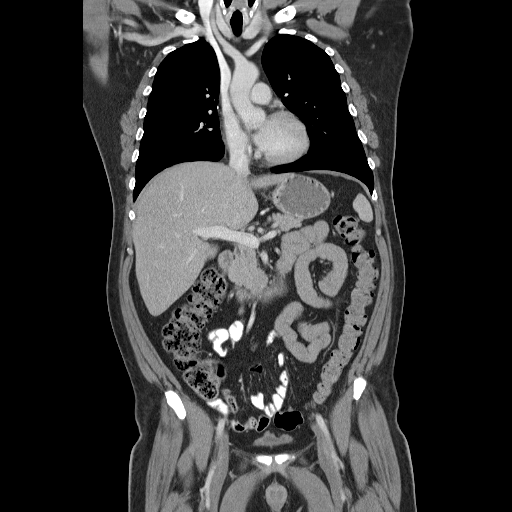
[im 51/92  soft-tissue]
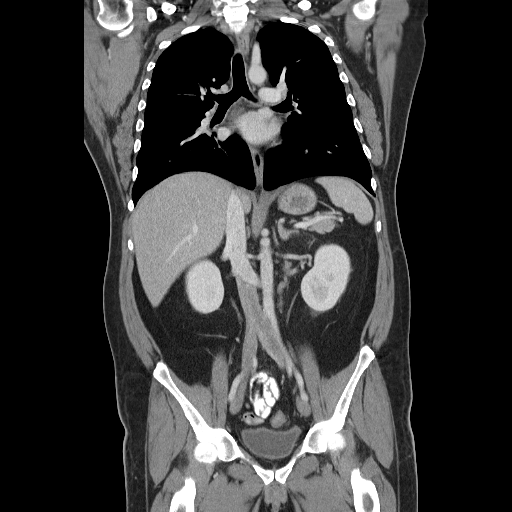

[16 of 46 positions shown; findings below may reference images not displayed]

FINDINGS: There has been no change in the calcified granuloma the
right middle lobe or the calcified right hilar lymph nodes.  There
are no enlarged mediastinal or hilar lymph nodes.  Previously
demonstrated mildly prominent left paratracheal lymph nodes have
decreased in size (on images 12 through 14 of the current
examination).  There is no axillary adenopathy.  There is no
pleural or pericardial effusion.

A probable sebaceous cyst in the right anterior chest wall is again
noted on image 19.  There is evidence of mild gynecomastia.
Nodularity along the left major fissure on images 26 and 27 is
unchanged.  There are no suspicious pulmonary findings.
IMPRESSION: 1.  No evidence of recurrent lymphoma in the chest.
2.  Stable findings related to previous granulomatous disease.

CT ABDOMEN
FINDINGS: The spleen remains normal in size without focal
abnormality.  The liver, gallbladder, pancreas, adrenal glands and
kidneys appear unremarkable.

The previously demonstrated enlarged retroperitoneal lymph nodes
have markedly improved.  An aortocaval node which previously
measured 16 x 16 mm now measures 12 x 8 mm on image 67.  A left
periaortic node which previously measured 23 x 19 mm now measures
14 x 7 mm on image 71.  There is no progressive adenopathy.  No
bowel lesions are identified.  Specifically, no abnormality of the
cecum is seen; please refer to separate PET CT.  No suspicious
osseous lesions are present.
IMPRESSION: 1.  Marked interval improvement in previously demonstrated
retroperitoneal lymphadenopathy.

2.  No residual pathologically enlarged lymph nodes are identified.
3.  No bowel lesions are demonstrated.  Please refer to separate
PET CT report from today.

CT PELVIS
FINDINGS: There is no pelvic mass, fluid collection or inflammatory
process.  No enlarged pelvic lymph nodes are demonstrated.  The
urinary bladder, prostate gland and seminal vesicles appear normal.
There are no suspicious osseous lesions.
IMPRESSION: Stable CT of the pelvis.  No evidence of recurrent lymphoma.

## 2009-02-20 IMAGING — PT NM PET TUM IMG RESTAG (PS) SKULL BASE T - THIGH
7 series · 25 of 25 positions shown · non-contrast
Comparison: PET CT [DATE] and diagnostic CTs done today.

CLINICAL DATA: Restaging subsequent therapy for Hodgkin's
lymphoma.  Chemotherapy ongoing.

NUCLEAR MEDICINE FDG PET CT TUMOR SUBSEQUENT IMAGING- (SKULL BASE
THROUGH THIGHS)
TECHNIQUE: 17.6 mCi F-18 FDG was injected intravenously via the
right antecubital fossa.  Full-ring PET imaging was performed from
the skull base through the mid-thighs 54  minutes after injection.
CT data was obtained and used for attenuation correction and
anatomic localization only.  (This was not acquired as a diagnostic
CT examination.)
Fasting Blood Glucose:  [DATE]

[Series 1: pet ac · axial · 3.3mm · 4.69mm/px · z∈[-870,+0]mm · 5 of 267 slices shown]
[im 1/267]
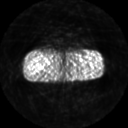
[im 67/267]
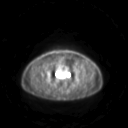
[im 134/267]
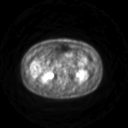
[im 200/267]
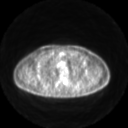
[im 267/267]
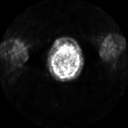

[Series 2: ct images · axial · 3.8mm · 0.98mm/px · z∈[-870,+0]mm · 5 of 266 slices shown]
[im 1/266]
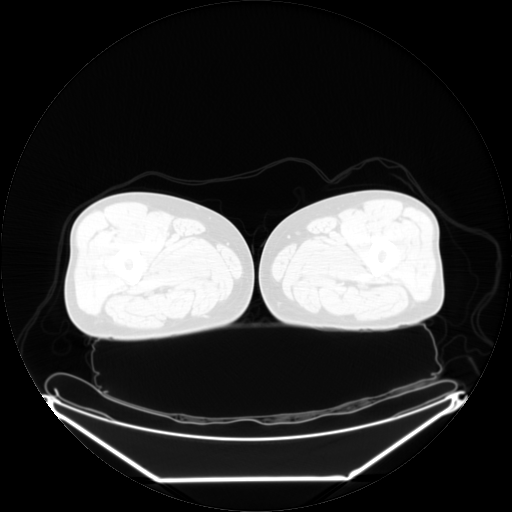
[im 67/266]
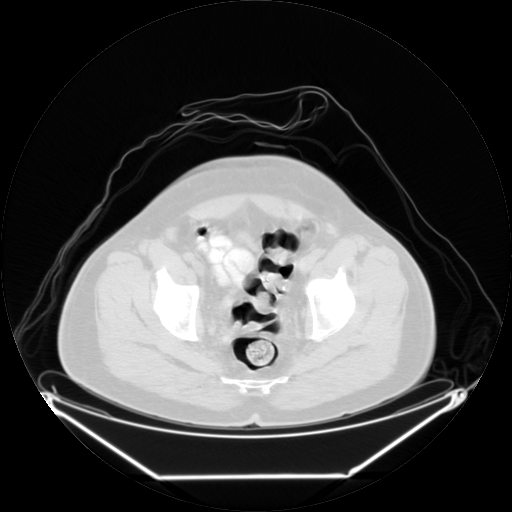
[im 133/266]
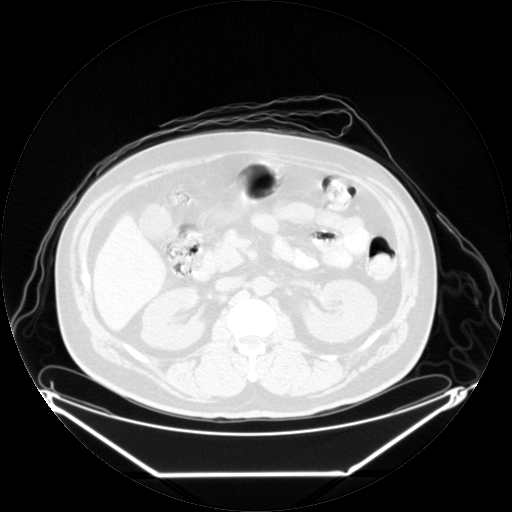
[im 199/266]
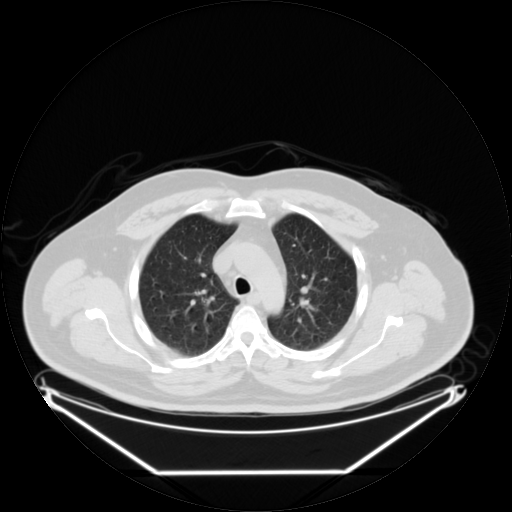
[im 266/266  brain]
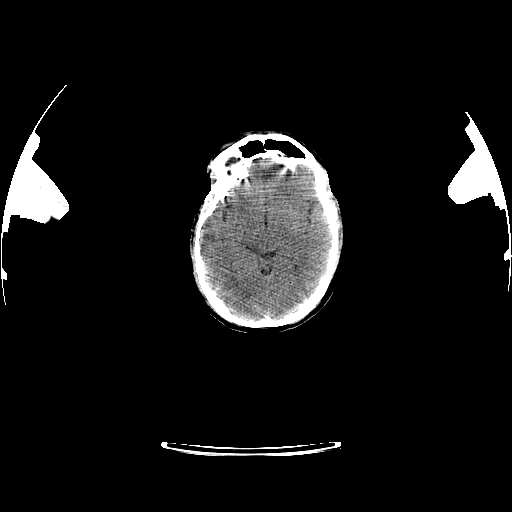

[Series 2: pet nac · axial · 3.3mm · 4.69mm/px · z∈[-870,+0]mm · 5 of 267 slices shown]
[im 1/267]
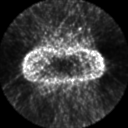
[im 67/267]
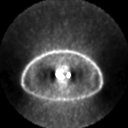
[im 134/267]
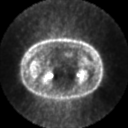
[im 200/267]
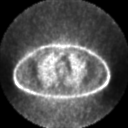
[im 267/267]
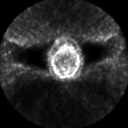

[Series 123: mip · coronal · 3.3mm · 4.69mm/px · 1 of 30 slices shown]
[im 1/30]
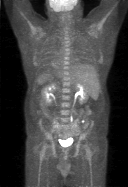

[Series 151: reformatted · axial · 3.3mm · 0.98mm/px · 1 of 2 slices shown (1 of 3)]
[im 1/2]
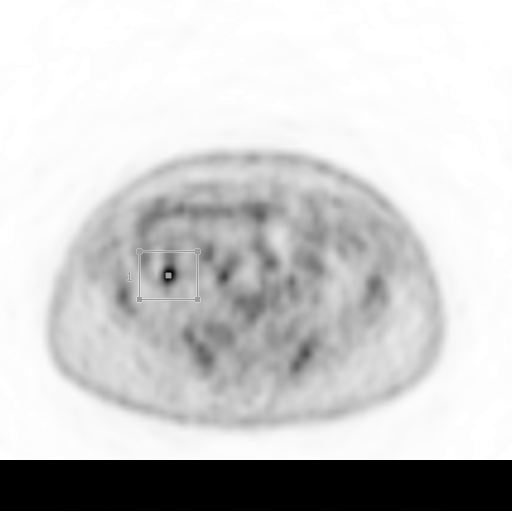

[Series 151: reformatted · axial · 3.3mm · 3.91mm/px · z∈[-870,+0]mm · 6 of 267 slices shown (2 of 3)]
[im 1/267]
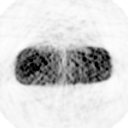
[im 54/267]
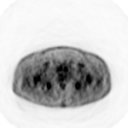
[im 107/267]
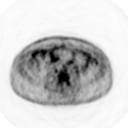
[im 160/267]
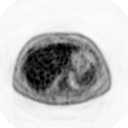
[im 213/267]
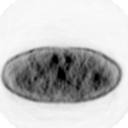
[im 267/267]
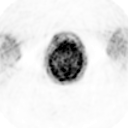

[Series 153: reformatted · coronal · 4.7mm · 6.98mm/px · 2 of 73 slices shown (3 of 3)]
[im 1/73]
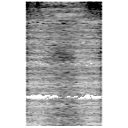
[im 73/73]
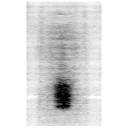

[25 of 25 positions shown; findings below may reference images not displayed]

FINDINGS: The previously demonstrated hypermetabolic activity in
Waldeyer's ring has improved and is now within physiologic limits.
There is no residual hypermetabolic nodal activity within the chest
or retroperitoneum.  Hypermetabolic lymph nodes demonstrated in
those areas on the prior examination have decreased in size as
reported on separate diagnostic CTs.

Focally increased activity is noted along the medial wall of the
cecum.  This has a maximal SUV of 12.9 and is quite distinct with
respect to the remainder of the bowel.  In retrospect, focally
increased activity was present in this region on the prior study
but was less apparent given the general background bowel activity
and nodal activity on the prior study.  Because of the persistence
and focality, this may reflect a true lesion.  No discrete
abnormality is apparent on the CT images.

There is no abnormal metabolic activity in the lungs, liver, spleen
or adrenal glands.  There is no abnormal osseous activity.
IMPRESSION: 1.  Interval resolution of hypermetabolic activity previously
demonstrated in Waldeyer's ring and lymph nodes within the
mediastinum and retroperitoneum.
2.  The current study demonstrates no evidence of metabolically
active lymphoma.
3.  Concern of persistent focal activity medially in the cecum.
Although there is no obvious CT correlate, this finding could be a
manifestation of a villous adenoma or other sessile polyp.  This is
unlikely to be related to the patient's lymphoma given the
improvement in the additional findings.  Colonoscopy is recommended
for further evaluation.

## 2009-02-26 ENCOUNTER — Encounter: Payer: Self-pay | Admitting: Gastroenterology

## 2009-02-26 LAB — COMPREHENSIVE METABOLIC PANEL
ALT: 23 U/L (ref 0–53)
Albumin: 4.1 g/dL (ref 3.5–5.2)
CO2: 24 mEq/L (ref 19–32)
Calcium: 9.6 mg/dL (ref 8.4–10.5)
Chloride: 104 mEq/L (ref 96–112)
Creatinine, Ser: 0.71 mg/dL (ref 0.40–1.50)
Potassium: 4.7 mEq/L (ref 3.5–5.3)
Sodium: 138 mEq/L (ref 135–145)
Total Protein: 6.6 g/dL (ref 6.0–8.3)

## 2009-02-26 LAB — CBC WITH DIFFERENTIAL/PLATELET
BASO%: 0.5 % (ref 0.0–2.0)
HCT: 42.1 % (ref 38.4–49.9)
MCHC: 34.7 g/dL (ref 32.0–36.0)
MONO#: 1.1 10*3/uL — ABNORMAL HIGH (ref 0.1–0.9)
NEUT%: 63.6 % (ref 39.0–75.0)
WBC: 8.2 10*3/uL (ref 4.0–10.3)
lymph#: 1.7 10*3/uL (ref 0.9–3.3)

## 2009-02-26 LAB — URIC ACID: Uric Acid, Serum: 5.3 mg/dL (ref 4.0–7.8)

## 2009-03-06 ENCOUNTER — Encounter: Payer: Self-pay | Admitting: Gastroenterology

## 2009-03-06 LAB — LACTATE DEHYDROGENASE: LDH: 179 U/L (ref 94–250)

## 2009-03-06 LAB — CBC WITH DIFFERENTIAL/PLATELET
Basophils Absolute: 0 10*3/uL (ref 0.0–0.1)
EOS%: 3.6 % (ref 0.0–7.0)
HCT: 40.4 % (ref 38.4–49.9)
HGB: 14.3 g/dL (ref 13.0–17.1)
MCH: 31.2 pg (ref 27.2–33.4)
NEUT%: 79.4 % — ABNORMAL HIGH (ref 39.0–75.0)
lymph#: 1.1 10*3/uL (ref 0.9–3.3)

## 2009-03-06 LAB — COMPREHENSIVE METABOLIC PANEL
BUN: 14 mg/dL (ref 6–23)
CO2: 22 mEq/L (ref 19–32)
Calcium: 9.3 mg/dL (ref 8.4–10.5)
Chloride: 99 mEq/L (ref 96–112)
Creatinine, Ser: 0.79 mg/dL (ref 0.40–1.50)

## 2009-03-06 LAB — URIC ACID: Uric Acid, Serum: 4.9 mg/dL (ref 4.0–7.8)

## 2009-03-13 LAB — CBC WITH DIFFERENTIAL/PLATELET
Basophils Absolute: 0.1 10*3/uL (ref 0.0–0.1)
Eosinophils Absolute: 0.3 10*3/uL (ref 0.0–0.5)
HGB: 14.2 g/dL (ref 13.0–17.1)
LYMPH%: 14.4 % (ref 14.0–49.0)
MCV: 88.8 fL (ref 79.3–98.0)
MONO#: 1.1 10*3/uL — ABNORMAL HIGH (ref 0.1–0.9)
MONO%: 10.1 % (ref 0.0–14.0)
NEUT#: 8.1 10*3/uL — ABNORMAL HIGH (ref 1.5–6.5)
Platelets: 192 10*3/uL (ref 140–400)
RBC: 4.56 10*6/uL (ref 4.20–5.82)
WBC: 11.4 10*3/uL — ABNORMAL HIGH (ref 4.0–10.3)

## 2009-03-13 LAB — COMPREHENSIVE METABOLIC PANEL
Albumin: 4 g/dL (ref 3.5–5.2)
BUN: 13 mg/dL (ref 6–23)
CO2: 24 mEq/L (ref 19–32)
Glucose, Bld: 148 mg/dL — ABNORMAL HIGH (ref 70–99)
Potassium: 4.4 mEq/L (ref 3.5–5.3)
Sodium: 136 mEq/L (ref 135–145)
Total Bilirubin: 0.4 mg/dL (ref 0.3–1.2)
Total Protein: 6.5 g/dL (ref 6.0–8.3)

## 2009-03-13 LAB — LACTATE DEHYDROGENASE: LDH: 129 U/L (ref 94–250)

## 2009-03-20 ENCOUNTER — Encounter: Payer: Self-pay | Admitting: Gastroenterology

## 2009-03-20 LAB — CBC WITH DIFFERENTIAL/PLATELET
Basophils Absolute: 0 10*3/uL (ref 0.0–0.1)
Eosinophils Absolute: 0.1 10*3/uL (ref 0.0–0.5)
HGB: 14.2 g/dL (ref 13.0–17.1)
MONO#: 0.9 10*3/uL (ref 0.1–0.9)
MONO%: 10.8 % (ref 0.0–14.0)
NEUT#: 6.2 10*3/uL (ref 1.5–6.5)
RBC: 4.6 10*6/uL (ref 4.20–5.82)
RDW: 14 % (ref 11.0–14.6)
WBC: 8.6 10*3/uL (ref 4.0–10.3)
lymph#: 1.3 10*3/uL (ref 0.9–3.3)

## 2009-03-20 LAB — COMPREHENSIVE METABOLIC PANEL
Albumin: 3.6 g/dL (ref 3.5–5.2)
Alkaline Phosphatase: 68 U/L (ref 39–117)
CO2: 26 mEq/L (ref 19–32)
Calcium: 9.3 mg/dL (ref 8.4–10.5)
Chloride: 103 mEq/L (ref 96–112)
Glucose, Bld: 257 mg/dL — ABNORMAL HIGH (ref 70–99)
Potassium: 4.1 mEq/L (ref 3.5–5.3)
Sodium: 136 mEq/L (ref 135–145)
Total Protein: 6.5 g/dL (ref 6.0–8.3)

## 2009-03-30 ENCOUNTER — Ambulatory Visit: Payer: Self-pay | Admitting: Internal Medicine

## 2009-04-03 LAB — CBC WITH DIFFERENTIAL/PLATELET
BASO%: 0.5 % (ref 0.0–2.0)
LYMPH%: 16.6 % (ref 14.0–49.0)
MCHC: 34.7 g/dL (ref 32.0–36.0)
MONO#: 1.3 10*3/uL — ABNORMAL HIGH (ref 0.1–0.9)
MONO%: 13.9 % (ref 0.0–14.0)
Platelets: 202 10*3/uL (ref 140–400)
RBC: 4.49 10*6/uL (ref 4.20–5.82)
RDW: 14.3 % (ref 11.0–14.6)
WBC: 9.3 10*3/uL (ref 4.0–10.3)

## 2009-04-03 LAB — COMPREHENSIVE METABOLIC PANEL
ALT: 13 U/L (ref 0–53)
Alkaline Phosphatase: 111 U/L (ref 39–117)
CO2: 22 mEq/L (ref 19–32)
Sodium: 133 mEq/L — ABNORMAL LOW (ref 135–145)
Total Bilirubin: 0.4 mg/dL (ref 0.3–1.2)
Total Protein: 6.5 g/dL (ref 6.0–8.3)

## 2009-04-09 ENCOUNTER — Encounter: Payer: Self-pay | Admitting: Gastroenterology

## 2009-04-17 LAB — COMPREHENSIVE METABOLIC PANEL
ALT: 16 U/L (ref 0–53)
CO2: 20 mEq/L (ref 19–32)
Calcium: 8.8 mg/dL (ref 8.4–10.5)
Chloride: 98 mEq/L (ref 96–112)
Sodium: 131 mEq/L — ABNORMAL LOW (ref 135–145)
Total Protein: 6.1 g/dL (ref 6.0–8.3)

## 2009-04-17 LAB — LACTATE DEHYDROGENASE: LDH: 155 U/L (ref 94–250)

## 2009-04-17 LAB — CBC WITH DIFFERENTIAL/PLATELET
Basophils Absolute: 0 10*3/uL (ref 0.0–0.1)
Eosinophils Absolute: 0.3 10*3/uL (ref 0.0–0.5)
HCT: 39.2 % (ref 38.4–49.9)
HGB: 13.7 g/dL (ref 13.0–17.1)
LYMPH%: 16.3 % (ref 14.0–49.0)
MCV: 89.6 fL (ref 79.3–98.0)
MONO#: 0.7 10*3/uL (ref 0.1–0.9)
MONO%: 8.5 % (ref 0.0–14.0)
NEUT#: 6 10*3/uL (ref 1.5–6.5)
NEUT%: 71.3 % (ref 39.0–75.0)
Platelets: 151 10*3/uL (ref 140–400)
WBC: 8.4 10*3/uL (ref 4.0–10.3)

## 2009-04-24 LAB — CBC WITH DIFFERENTIAL/PLATELET
Basophils Absolute: 0 10*3/uL (ref 0.0–0.1)
EOS%: 1.7 % (ref 0.0–7.0)
MCH: 30.8 pg (ref 27.2–33.4)
MCV: 86.4 fL (ref 79.3–98.0)
MONO%: 9 % (ref 0.0–14.0)
RBC: 4.78 10*6/uL (ref 4.20–5.82)
RDW: 13.5 % (ref 11.0–14.6)

## 2009-04-24 LAB — COMPREHENSIVE METABOLIC PANEL
Alkaline Phosphatase: 99 U/L (ref 39–117)
Creatinine, Ser: 0.73 mg/dL (ref 0.40–1.50)
Glucose, Bld: 353 mg/dL — ABNORMAL HIGH (ref 70–99)
Sodium: 134 mEq/L — ABNORMAL LOW (ref 135–145)
Total Bilirubin: 0.4 mg/dL (ref 0.3–1.2)
Total Protein: 6.9 g/dL (ref 6.0–8.3)

## 2009-05-01 ENCOUNTER — Encounter: Payer: Self-pay | Admitting: Gastroenterology

## 2009-05-01 LAB — CBC WITH DIFFERENTIAL/PLATELET
BASO%: 0.3 % (ref 0.0–2.0)
Basophils Absolute: 0 10*3/uL (ref 0.0–0.1)
EOS%: 1.8 % (ref 0.0–7.0)
HCT: 43.2 % (ref 38.4–49.9)
HGB: 15.1 g/dL (ref 13.0–17.1)
LYMPH%: 20.6 % (ref 14.0–49.0)
MCH: 30.4 pg (ref 27.2–33.4)
MCHC: 35 g/dL (ref 32.0–36.0)
MCV: 87.1 fL (ref 79.3–98.0)
MONO%: 12.3 % (ref 0.0–14.0)
NEUT%: 65 % (ref 39.0–75.0)
lymph#: 1.6 10*3/uL (ref 0.9–3.3)

## 2009-05-01 LAB — COMPREHENSIVE METABOLIC PANEL
AST: 17 U/L (ref 0–37)
Albumin: 4.1 g/dL (ref 3.5–5.2)
Alkaline Phosphatase: 83 U/L (ref 39–117)
BUN: 11 mg/dL (ref 6–23)
Creatinine, Ser: 0.6 mg/dL (ref 0.40–1.50)
Glucose, Bld: 218 mg/dL — ABNORMAL HIGH (ref 70–99)
Potassium: 4.4 mEq/L (ref 3.5–5.3)
Total Bilirubin: 0.5 mg/dL (ref 0.3–1.2)

## 2009-05-08 LAB — CBC WITH DIFFERENTIAL/PLATELET
BASO%: 0.3 % (ref 0.0–2.0)
LYMPH%: 20.2 % (ref 14.0–49.0)
MCHC: 35.7 g/dL (ref 32.0–36.0)
MCV: 88.1 fL (ref 79.3–98.0)
MONO%: 3.8 % (ref 0.0–14.0)
Platelets: 210 10*3/uL (ref 140–400)
RBC: 4.45 10*6/uL (ref 4.20–5.82)

## 2009-05-08 LAB — COMPREHENSIVE METABOLIC PANEL
ALT: 15 U/L (ref 0–53)
Alkaline Phosphatase: 70 U/L (ref 39–117)
Glucose, Bld: 265 mg/dL — ABNORMAL HIGH (ref 70–99)
Sodium: 133 mEq/L — ABNORMAL LOW (ref 135–145)
Total Bilirubin: 0.4 mg/dL (ref 0.3–1.2)
Total Protein: 6.6 g/dL (ref 6.0–8.3)

## 2009-05-14 ENCOUNTER — Ambulatory Visit (HOSPITAL_COMMUNITY): Admission: RE | Admit: 2009-05-14 | Discharge: 2009-05-14 | Payer: Self-pay | Admitting: Internal Medicine

## 2009-05-14 IMAGING — CT CT NECK W/ CM
3 of 8 series · 11 of 33 positions shown, 12 images · IV contrast (agent unspecified)
Comparison: Prior PET and chest, abdomen, pelvic CTs of [DATE].
No prior dedicated neck CTs.

CT NECK

CLINICAL DATA: Follow up of Hodgkin's lymphoma.  Chemotherapy
completed for 2 weeks.  No complaints.

CT NECK, CHEST, ABDOMEN AND PELVIS WITH CONTRAST
TECHNIQUE: Multidetector CT imaging of the neck, chest, abdomen and
pelvis was performed using the standard protocol following the
bolus administration of intravenous contrast.
Contrast: 125 ml [52]

[Series 2: cap st · axial · 0.84mm/px · z∈[-634,-274]mm · 4 of 122 slices shown, 5 images]
[im 25/122  soft-tissue]
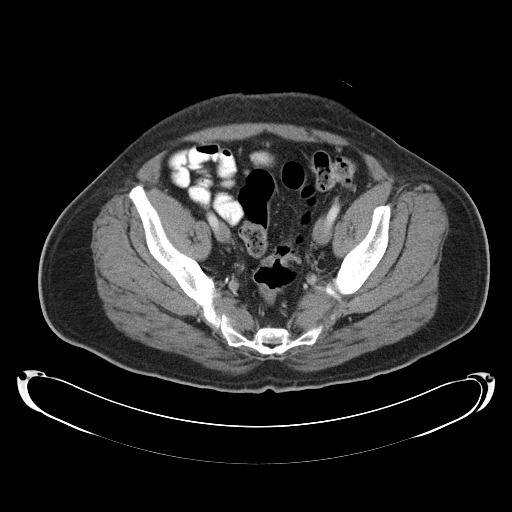
[im 25/122  bone]
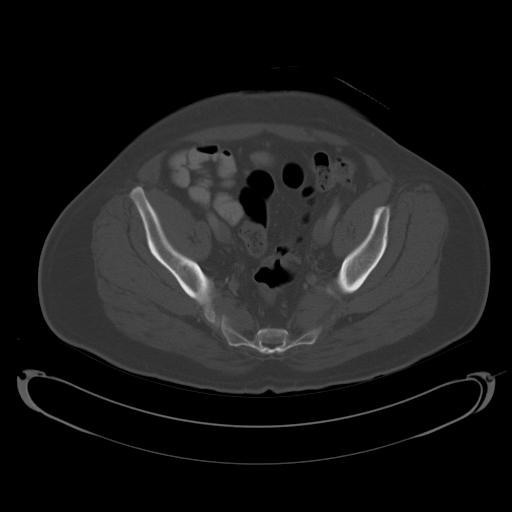
[im 49/122  bone]
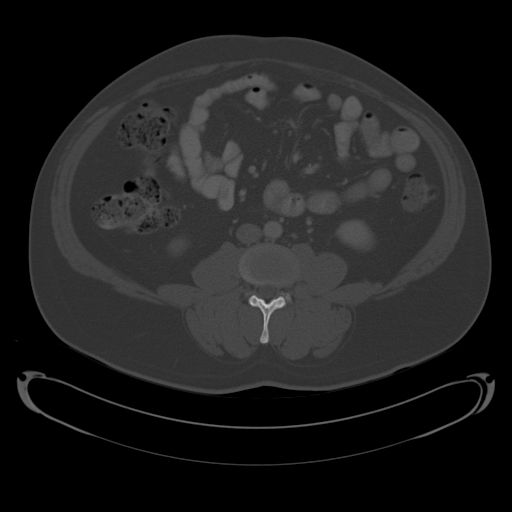
[im 73/122  bone]
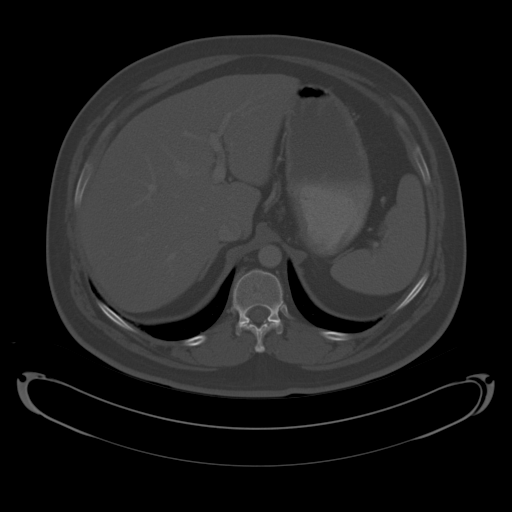
[im 97/122  bone]
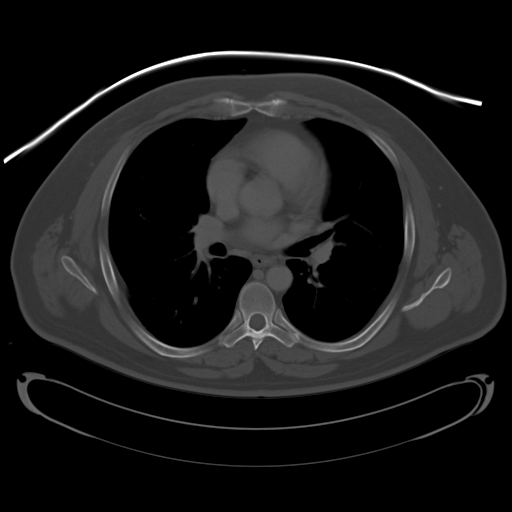

[Series 602: <mpr thick range> · coronal · 1.19mm/px · 2 of 89 slices shown]
[im 30/89  bone]
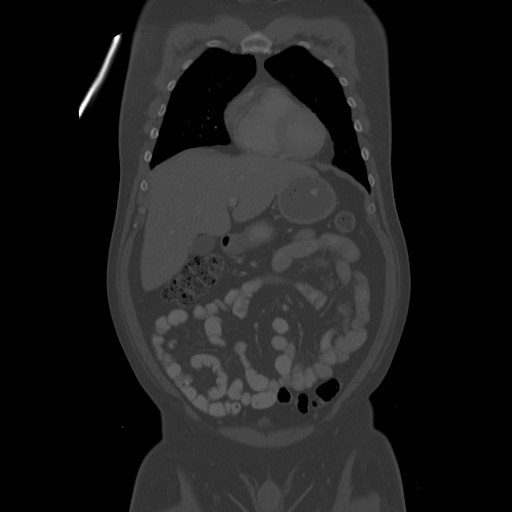
[im 59/89  bone]
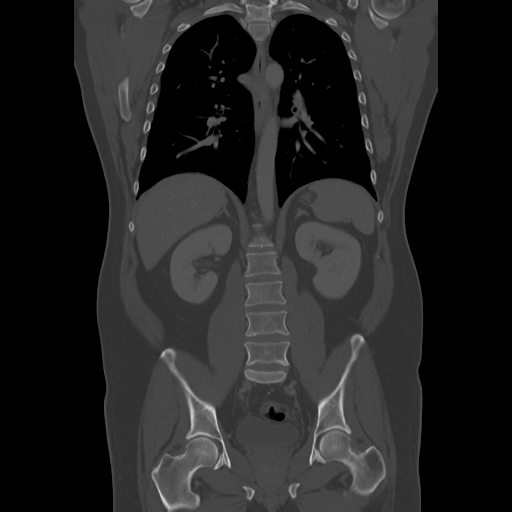

[Series 603: <mpr thick range(1)> · sagittal · 1.19mm/px · 5 of 122 slices shown]
[im 31/122  bone]
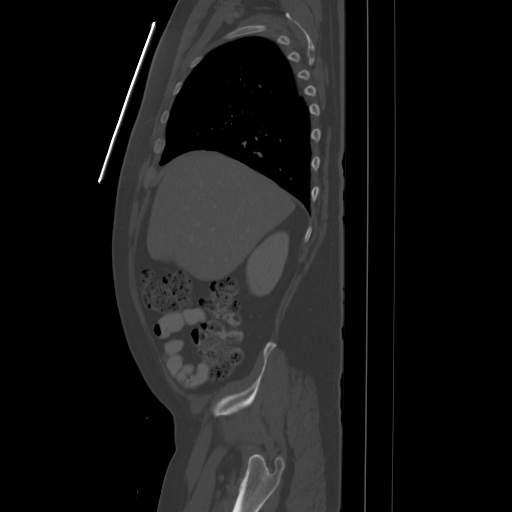
[im 46/122  bone]
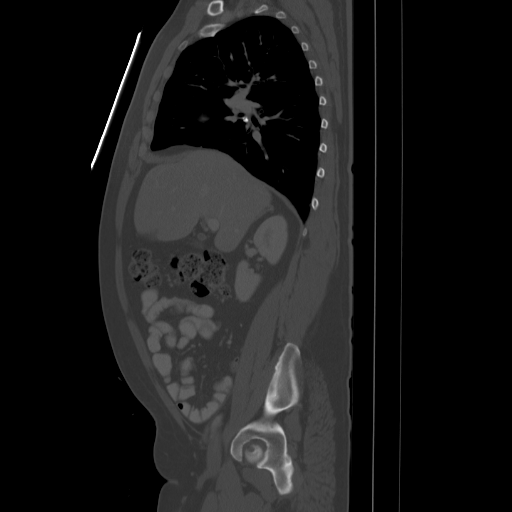
[im 61/122  bone]
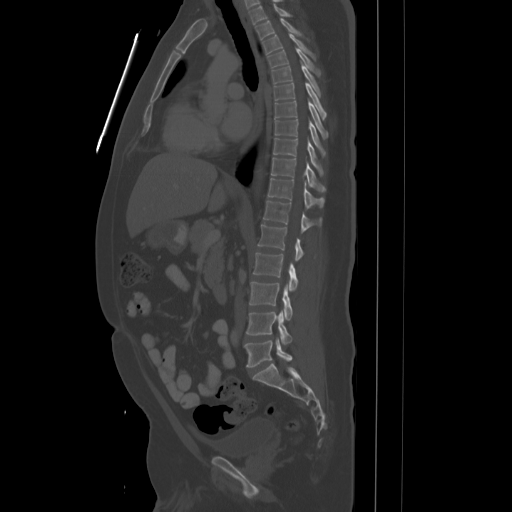
[im 76/122  bone]
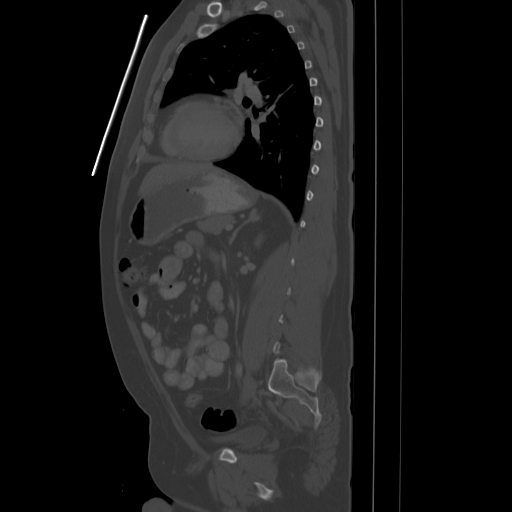
[im 91/122  bone]
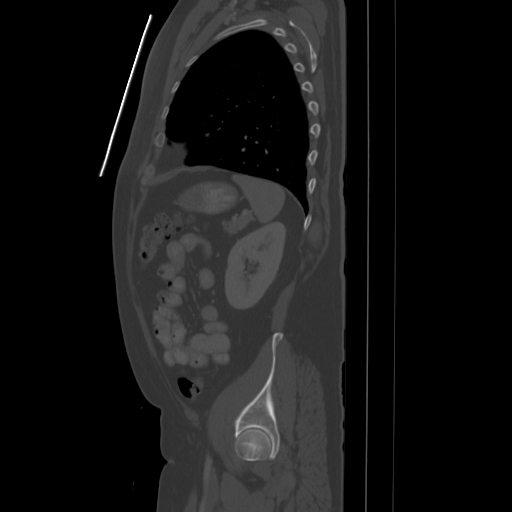

[11 of 33 positions shown; findings below may reference images not displayed]

FINDINGS: Limited intracranial imaging is within normal limits.
Image portions of the orbits and globes are within normal limits.

Normal appearance of the nasopharynx, oropharynx, and larynx.
Normal thyroid gland, submandibular glands, and parotid glands.
Small jugular chain lymph nodes bilaterally.  No cervical
adenopathy.  All vascular structures enhance normally. No acute
osseous abnormality.
IMPRESSION: No evidence of active lymphoma within the neck.

CT CHEST
FINDINGS: Lung windows demonstrate calcified right upper lobe lung
nodule consist with a granuloma on image 32.
Probable subpleural lymph nodes along the left major fissure
including on images 24 and 25.  Similar to on the prior exam.  No
new or enlarging lung nodules.

Soft tissue windows demonstrate near complete resolution of the
right chest wall skin lesion.  Minimal residual skin thickening on
image 15.

Normal heart size without pericardial or pleural effusion.
Calcified right hilar lymph nodes. No mediastinal or hilar
adenopathy.
IMPRESSION: No acute process or evidence of active lymphoma within the chest.

CT ABDOMEN
FINDINGS: Normal liver, spleen, stomach, pancreas, gallbladder,
biliary tract, adrenal glands, right kidney.  Too small to
characterize upper pole left renal lesion which is most likely a
cyst.  Retroaortic left renal vein which is a normal anatomic
variant.  Small retroperitoneal lymph nodes.  The largest node
measures 1.3 x 0.6 cm and is similar to 1.4 x 0.7 on the prior
exam.  This is in the left para-aortic station.  A precaval node is
slightly decreased in size and measures 6 mm short axis compared
with 8 mm on the prior exam.  Image 64.

Normal colon, appendix, and terminal ileum.
Normal abdominal small bowel without ascites.
IMPRESSION: 1.  Similar to slight decrease in size of small nonpathologically
enlarged retroperitoneal lymph nodes.
2.  Otherwise, no evidence of active lymphoma or acute process in
the abdomen.

CT PELVIS
FINDINGS: Normal pelvic bowel loops.  No pelvic adenopathy.
Normal urinary bladder and prostate.  No significant free fluid.
No acute osseous abnormality.
IMPRESSION: No acute process or evidence of active lymphoma within the pelvis.

## 2009-05-15 ENCOUNTER — Ambulatory Visit: Payer: Self-pay | Admitting: Internal Medicine

## 2009-05-17 ENCOUNTER — Encounter: Payer: Self-pay | Admitting: Gastroenterology

## 2009-05-17 LAB — COMPREHENSIVE METABOLIC PANEL
AST: 16 U/L (ref 0–37)
Albumin: 3.9 g/dL (ref 3.5–5.2)
Alkaline Phosphatase: 76 U/L (ref 39–117)
BUN: 8 mg/dL (ref 6–23)
Glucose, Bld: 262 mg/dL — ABNORMAL HIGH (ref 70–99)
Potassium: 4.3 mEq/L (ref 3.5–5.3)
Sodium: 137 mEq/L (ref 135–145)
Total Bilirubin: 0.5 mg/dL (ref 0.3–1.2)

## 2009-05-17 LAB — CBC WITH DIFFERENTIAL/PLATELET
Basophils Absolute: 0 10*3/uL (ref 0.0–0.1)
EOS%: 6.6 % (ref 0.0–7.0)
Eosinophils Absolute: 0.2 10*3/uL (ref 0.0–0.5)
LYMPH%: 29.7 % (ref 14.0–49.0)
MCH: 30.6 pg (ref 27.2–33.4)
MCV: 85.9 fL (ref 79.3–98.0)
MONO%: 19.3 % — ABNORMAL HIGH (ref 0.0–14.0)
Platelets: 178 10*3/uL (ref 140–400)
RBC: 4.74 10*6/uL (ref 4.20–5.82)
RDW: 12.7 % (ref 11.0–14.6)

## 2009-07-09 ENCOUNTER — Encounter: Payer: Self-pay | Admitting: Family Medicine

## 2009-07-09 ENCOUNTER — Ambulatory Visit: Payer: Self-pay | Admitting: Family Medicine

## 2009-07-09 DIAGNOSIS — E1136 Type 2 diabetes mellitus with diabetic cataract: Secondary | ICD-10-CM | POA: Insufficient documentation

## 2009-07-09 DIAGNOSIS — E1165 Type 2 diabetes mellitus with hyperglycemia: Secondary | ICD-10-CM | POA: Insufficient documentation

## 2009-07-09 LAB — CONVERTED CEMR LAB
ALT: 28 units/L (ref 0–53)
AST: 19 units/L (ref 0–37)
Albumin: 4.3 g/dL (ref 3.5–5.2)
Alkaline Phosphatase: 95 units/L (ref 39–117)
Glucose, Bld: 242 mg/dL — ABNORMAL HIGH (ref 70–99)
Hgb A1c MFr Bld: 8.5 %
LDL Cholesterol: 113 mg/dL — ABNORMAL HIGH (ref 0–99)
Potassium: 4.4 meq/L (ref 3.5–5.3)
Sodium: 137 meq/L (ref 135–145)
Total Protein: 6.9 g/dL (ref 6.0–8.3)
VLDL: 68 mg/dL — ABNORMAL HIGH (ref 0–40)

## 2009-07-11 ENCOUNTER — Encounter: Payer: Self-pay | Admitting: Family Medicine

## 2009-07-25 ENCOUNTER — Encounter: Payer: Self-pay | Admitting: *Deleted

## 2009-08-11 ENCOUNTER — Emergency Department (HOSPITAL_COMMUNITY): Admission: EM | Admit: 2009-08-11 | Discharge: 2009-08-11 | Payer: Self-pay | Admitting: Emergency Medicine

## 2009-08-14 ENCOUNTER — Ambulatory Visit: Payer: Self-pay | Admitting: Internal Medicine

## 2009-08-15 ENCOUNTER — Encounter: Payer: Self-pay | Admitting: *Deleted

## 2009-08-16 ENCOUNTER — Ambulatory Visit (HOSPITAL_COMMUNITY): Admission: RE | Admit: 2009-08-16 | Discharge: 2009-08-16 | Payer: Self-pay | Admitting: Internal Medicine

## 2009-08-16 LAB — COMPREHENSIVE METABOLIC PANEL
ALT: 116 U/L — ABNORMAL HIGH (ref 0–53)
AST: 70 U/L — ABNORMAL HIGH (ref 0–37)
Albumin: 3.8 g/dL (ref 3.5–5.2)
Calcium: 9.2 mg/dL (ref 8.4–10.5)
Chloride: 100 mEq/L (ref 96–112)
Potassium: 4.5 mEq/L (ref 3.5–5.3)

## 2009-08-16 LAB — CBC WITH DIFFERENTIAL/PLATELET
BASO%: 0.3 % (ref 0.0–2.0)
Basophils Absolute: 0 10*3/uL (ref 0.0–0.1)
EOS%: 3 % (ref 0.0–7.0)
HGB: 15 g/dL (ref 13.0–17.1)
MCH: 30.3 pg (ref 27.2–33.4)
RDW: 13.1 % (ref 11.0–14.6)
WBC: 8.9 10*3/uL (ref 4.0–10.3)
lymph#: 1.9 10*3/uL (ref 0.9–3.3)

## 2009-08-16 IMAGING — CT CT PELVIS W/ CM
2 of 5 series · 16 of 46 positions shown, 18 images · IV contrast (agent unspecified)
Comparison: Prior examinations [DATE] and [DATE].

CT CHEST

CLINICAL DATA: Hodgkin's lymphoma diagnosed in [DATE].
Chemotherapy completed 4 months ago.

CT CHEST, ABDOMEN AND PELVIS WITH CONTRAST
TECHNIQUE: Multidetector CT imaging of the chest, abdomen and
pelvis was performed following the standard protocol during bolus
administration of intravenous contrast.  The patient reported an
episode of itching 8 hours following the last CT scan.  For this
reason, 50 mg of Benadryl was administered 1 hour before the
examination.  There were no immediate complications.
Contrast: 100 ml [G8] intravenously.

[Series 2: cap with st · axial · 0.84mm/px · z∈[-599,-69]mm · 13 of 120 slices shown, 15 images]
[im 7/120  soft-tissue]
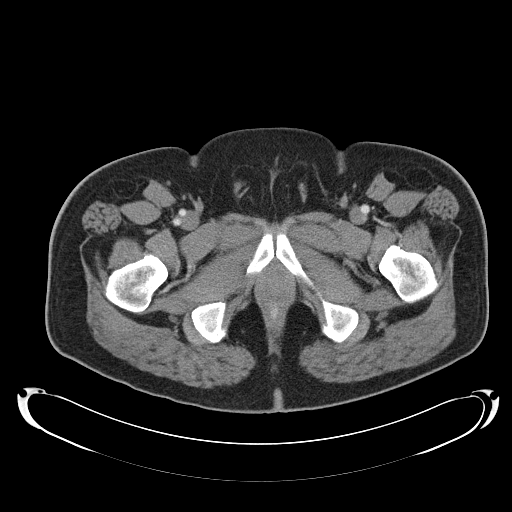
[im 7/120  bone]
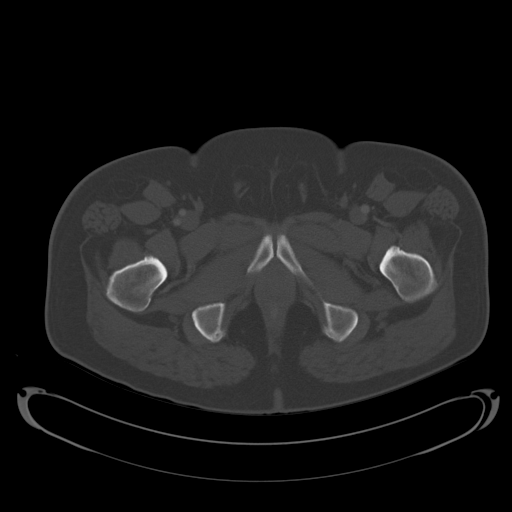
[im 19/120  soft-tissue]
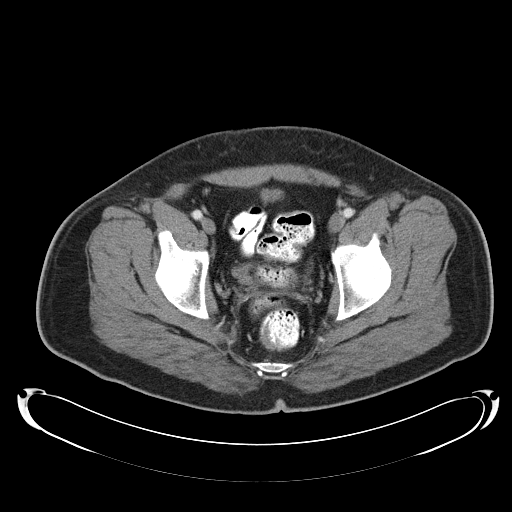
[im 26/120  soft-tissue]
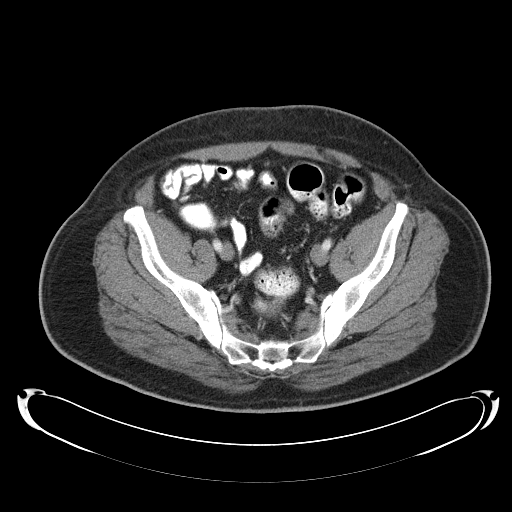
[im 32/120  soft-tissue]
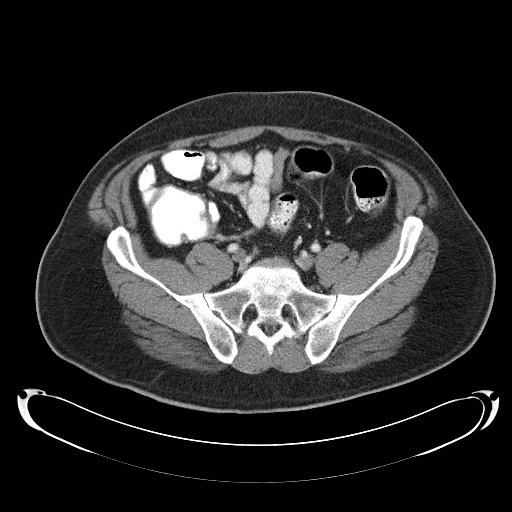
[im 44/120  soft-tissue]
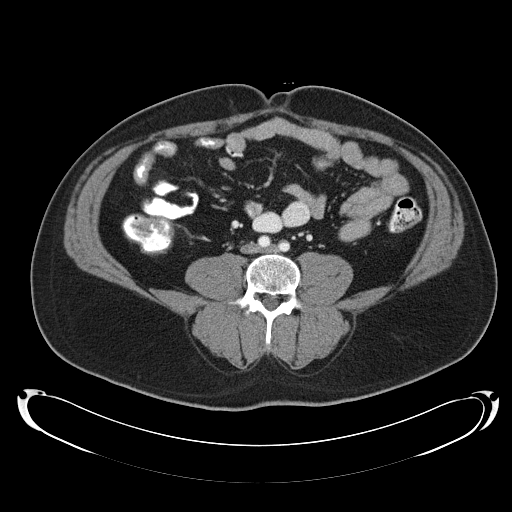
[im 51/120  soft-tissue]
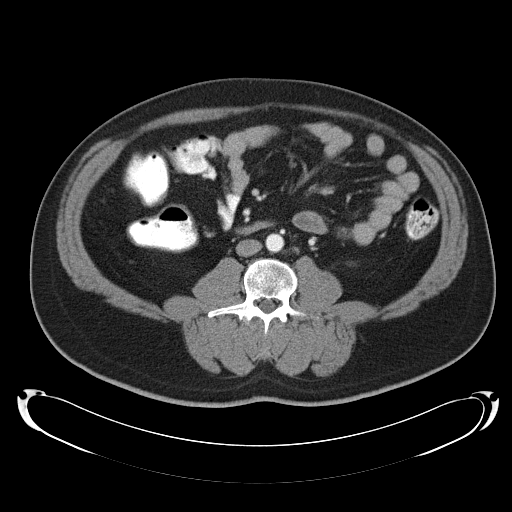
[im 63/120  soft-tissue]
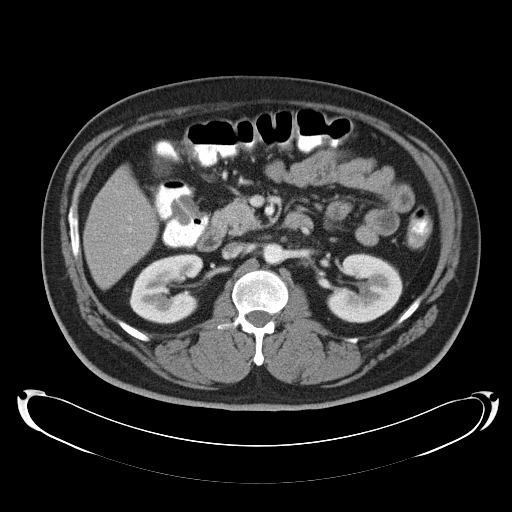
[im 69/120  soft-tissue]
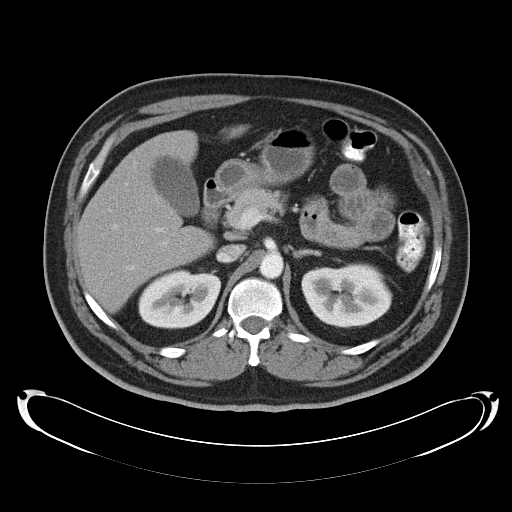
[im 76/120  soft-tissue]
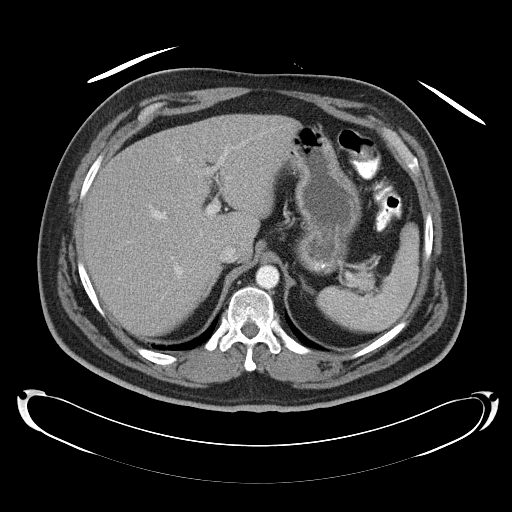
[im 76/120  bone]
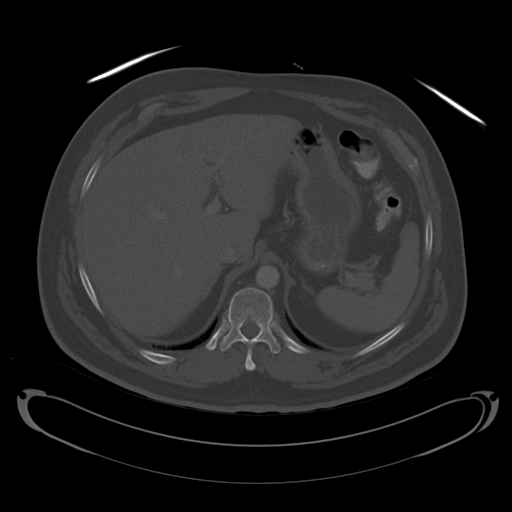
[im 88/120  soft-tissue]
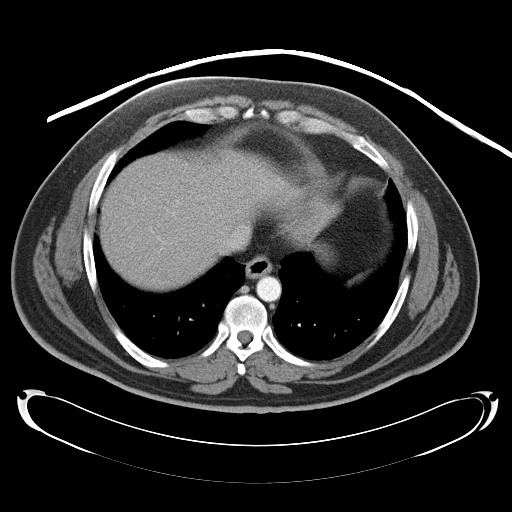
[im 94/120  soft-tissue]
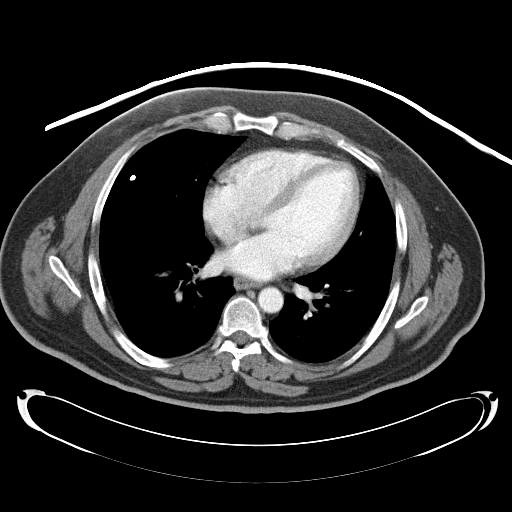
[im 101/120  soft-tissue]
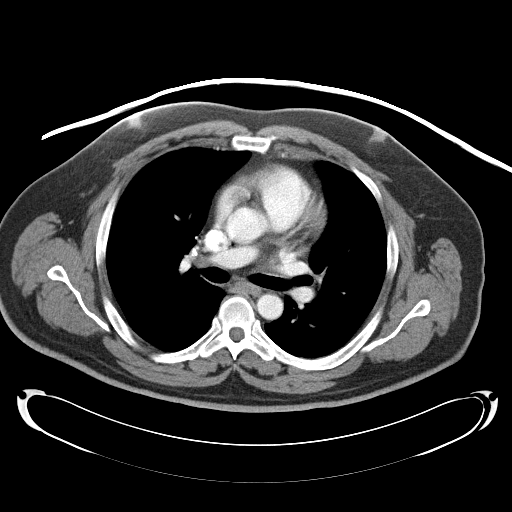
[im 113/120  soft-tissue]
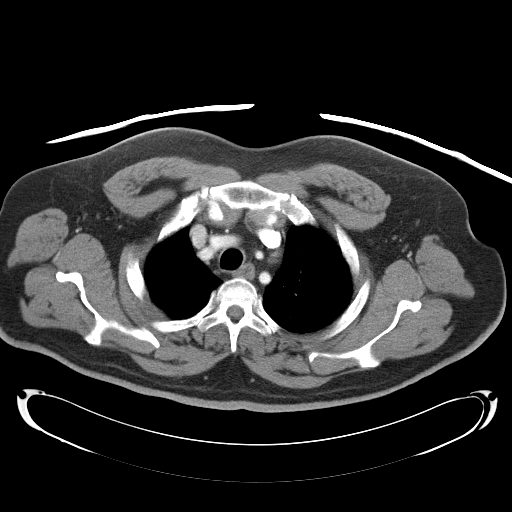

[Series 602: <mpr thick range> · coronal · 1.17mm/px · 3 of 97 slices shown]
[im 33/97  soft-tissue]
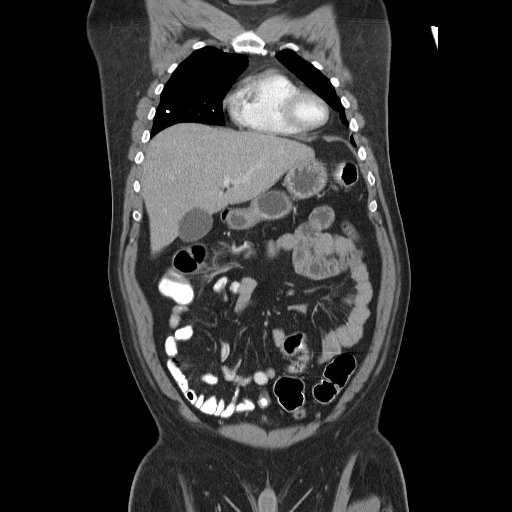
[im 43/97  soft-tissue]
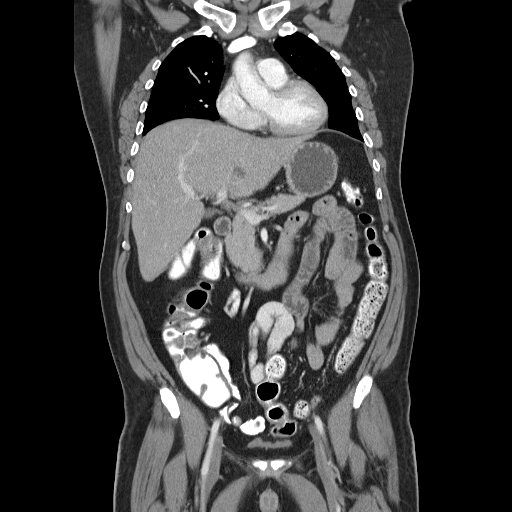
[im 54/97  soft-tissue]
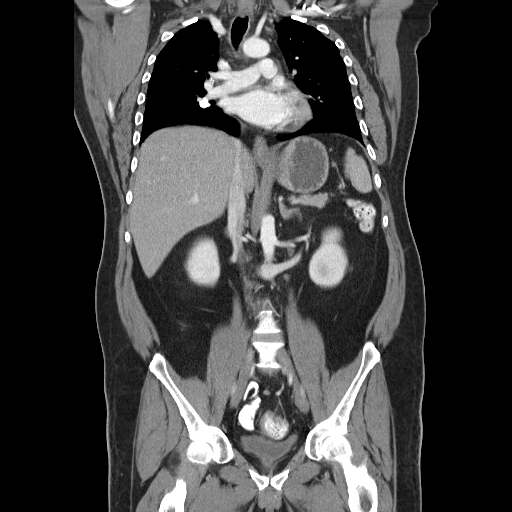

[16 of 46 positions shown; findings below may reference images not displayed]

FINDINGS: Calcified right hilar lymph nodes and a calcified right
middle lobe granuloma are again noted.  There are no enlarged
mediastinal or hilar lymph nodes.  There is no pleural or
pericardial effusion.  The lungs are otherwise clear.  There is a
small hiatal hernia.
IMPRESSION: 1.  No evidence of recurrent lymphoma.
2.  Stable findings compatible with prior granulomatous disease.

CT ABDOMEN
FINDINGS: The liver, spleen, gallbladder, pancreas and adrenal
glands appear normal.  The kidneys appear stable with a probable
tiny cyst in the upper pole of the left kidney, best seen on image
5 of series 3.

Scattered small retroperitoneal lymph nodes are unchanged.  There
is no progressive adenopathy.  Retroaortic left renal vein is again
noted.  There are no suspicious osseous findings.

Focal narrowing of the colonic lumen is noted in the region of the
hepatic flexure, seen on image 58 of series 2.  The appearance is
unchanged on the delayed images, obtained nearly 4 minutes later
(image 16 of series 3).  For this reason, this could reflect a
mucosal lesion as opposed to peristalsis.  I do not see any
abnormality in this area on the recent prior study and this is
reassuring.  The adjacent soft tissues appear normal.
IMPRESSION: 1.  Stable small retroperitoneal lymph nodes.  No evidence of
recurrent lymphoma.
2.  Focal constriction of the colonic lumen in the region of the
hepatic flexure most likely reflects peristalsis given the absence
of any abnormality in this area on the study from 3 months ago. A
mucosal lesion is difficult to exclude based on this examination.
Correlation with stool hemoccult and close follow-up are
recommended.

CT PELVIS
FINDINGS: There is no pelvic mass, fluid collection or inflammatory
process.  No enlarged pelvic lymph nodes are identified.  The
bladder, prostate gland and seminal vesicles appear normal.
IMPRESSION: Stable examination.  No evidence of active lymphoma.

## 2009-08-18 ENCOUNTER — Encounter (INDEPENDENT_AMBULATORY_CARE_PROVIDER_SITE_OTHER): Payer: Self-pay | Admitting: *Deleted

## 2009-08-18 DIAGNOSIS — Z87891 Personal history of nicotine dependence: Secondary | ICD-10-CM

## 2009-08-18 HISTORY — DX: Personal history of nicotine dependence: Z87.891

## 2009-09-19 ENCOUNTER — Encounter: Payer: Self-pay | Admitting: Gastroenterology

## 2009-09-24 ENCOUNTER — Ambulatory Visit: Payer: Self-pay | Admitting: Family Medicine

## 2009-09-24 DIAGNOSIS — E7800 Pure hypercholesterolemia, unspecified: Secondary | ICD-10-CM

## 2009-09-24 DIAGNOSIS — N529 Male erectile dysfunction, unspecified: Secondary | ICD-10-CM | POA: Insufficient documentation

## 2009-10-23 ENCOUNTER — Encounter: Payer: Self-pay | Admitting: Family Medicine

## 2009-11-05 ENCOUNTER — Ambulatory Visit: Payer: Self-pay | Admitting: Family Medicine

## 2009-11-05 DIAGNOSIS — M79609 Pain in unspecified limb: Secondary | ICD-10-CM

## 2009-11-05 LAB — CONVERTED CEMR LAB: Hgb A1c MFr Bld: 8.5 %

## 2009-11-12 ENCOUNTER — Encounter: Payer: Self-pay | Admitting: Family Medicine

## 2009-11-20 ENCOUNTER — Ambulatory Visit: Payer: Self-pay | Admitting: Internal Medicine

## 2009-11-22 LAB — CBC WITH DIFFERENTIAL/PLATELET
BASO%: 0.3 % (ref 0.0–2.0)
Eosinophils Absolute: 0.3 10*3/uL (ref 0.0–0.5)
HCT: 43.7 % (ref 38.4–49.9)
LYMPH%: 20.5 % (ref 14.0–49.0)
MCHC: 34.5 g/dL (ref 32.0–36.0)
MCV: 90.1 fL (ref 79.3–98.0)
MONO%: 11.1 % (ref 0.0–14.0)
NEUT%: 64.5 % (ref 39.0–75.0)
Platelets: 223 10*3/uL (ref 140–400)
RBC: 4.85 10*6/uL (ref 4.20–5.82)

## 2009-11-22 LAB — COMPREHENSIVE METABOLIC PANEL
Albumin: 4.1 g/dL (ref 3.5–5.2)
Alkaline Phosphatase: 83 U/L (ref 39–117)
BUN: 9 mg/dL (ref 6–23)
Glucose, Bld: 147 mg/dL — ABNORMAL HIGH (ref 70–99)
Total Bilirubin: 0.4 mg/dL (ref 0.3–1.2)

## 2009-11-23 ENCOUNTER — Ambulatory Visit (HOSPITAL_COMMUNITY): Admission: RE | Admit: 2009-11-23 | Discharge: 2009-11-23 | Payer: Self-pay | Admitting: Internal Medicine

## 2009-11-23 IMAGING — CT CT ABDOMEN W/ CM
2 of 5 series · 17 of 46 positions shown, 19 images · IV contrast (agent unspecified)
Comparison: [DATE]

CT CHEST

CLINICAL DATA: Follow up lymphoma

CT CHEST, ABDOMEN AND PELVIS WITH CONTRAST
TECHNIQUE: Multidetector CT imaging of the chest, abdomen and
pelvis was performed following the standard protocol during bolus
administration of intravenous contrast.
Contrast: 100 ml of omni 300

[Series 2: cap with st · axial · 0.83mm/px · z∈[-594,-44]mm · 14 of 126 slices shown, 16 images]
[im 8/126  soft-tissue]
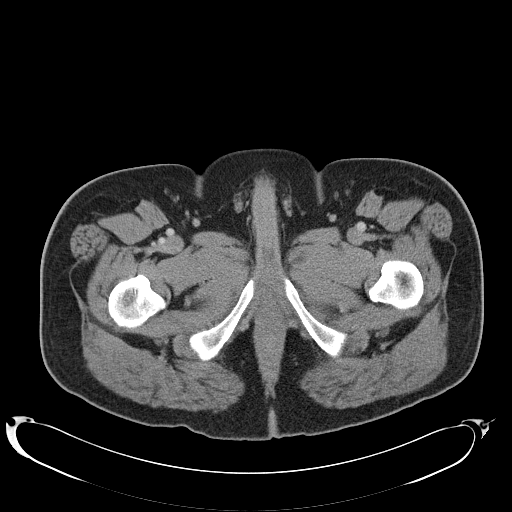
[im 8/126  bone]
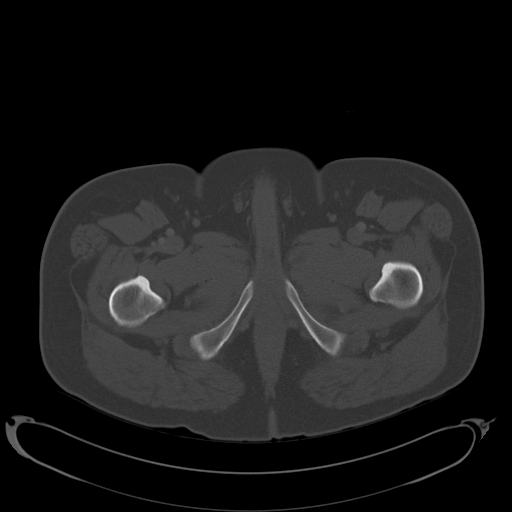
[im 15/126  soft-tissue]
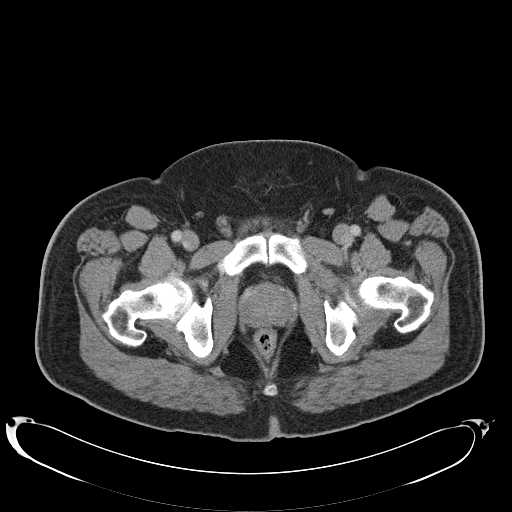
[im 23/126  soft-tissue]
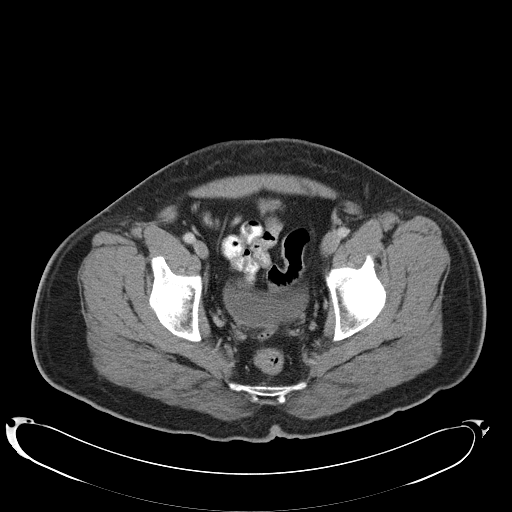
[im 37/126  soft-tissue]
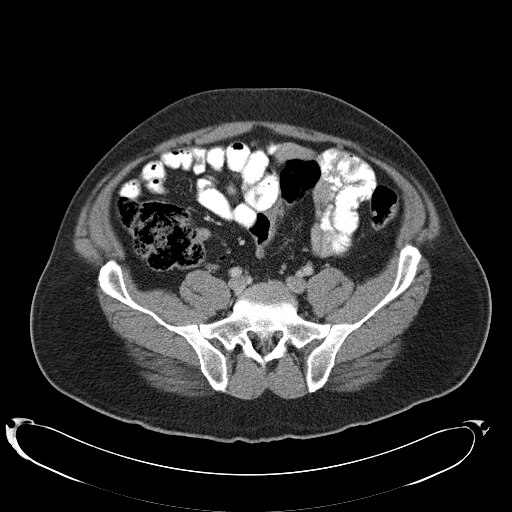
[im 45/126  soft-tissue]
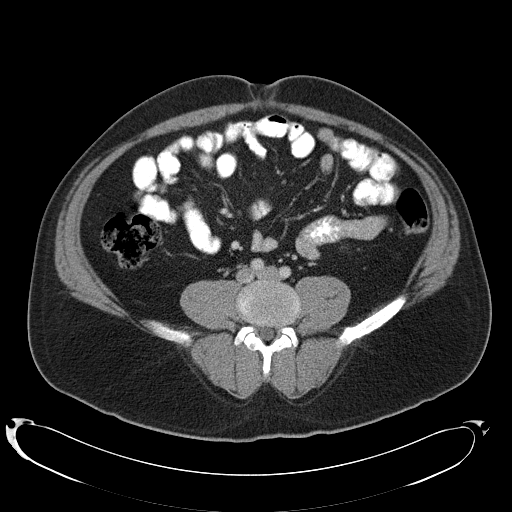
[im 52/126  soft-tissue]
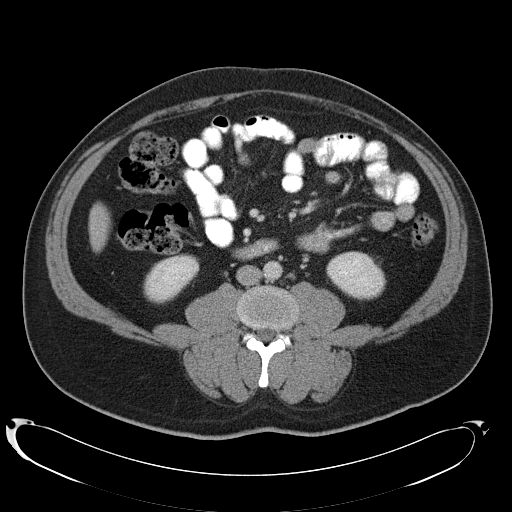
[im 59/126  soft-tissue]
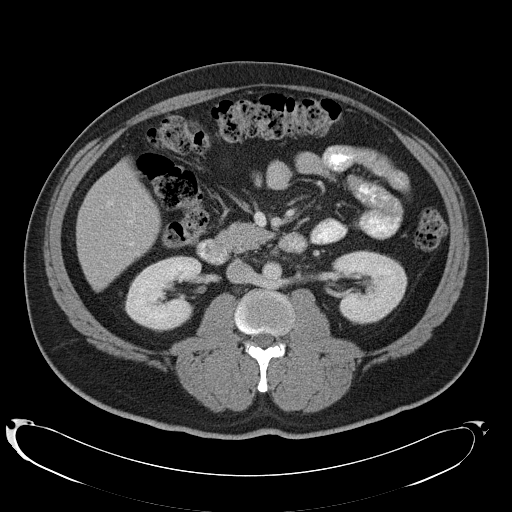
[im 67/126  soft-tissue]
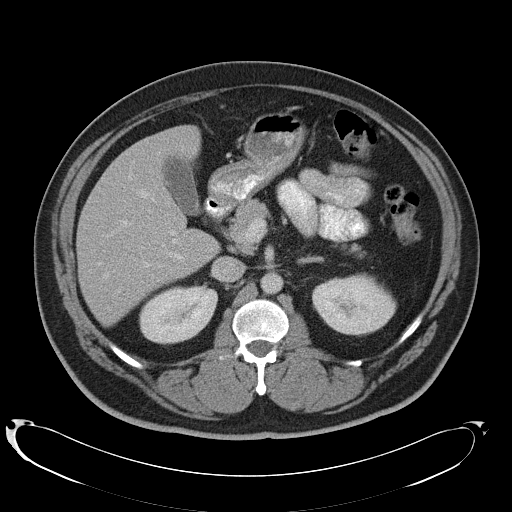
[im 74/126  soft-tissue]
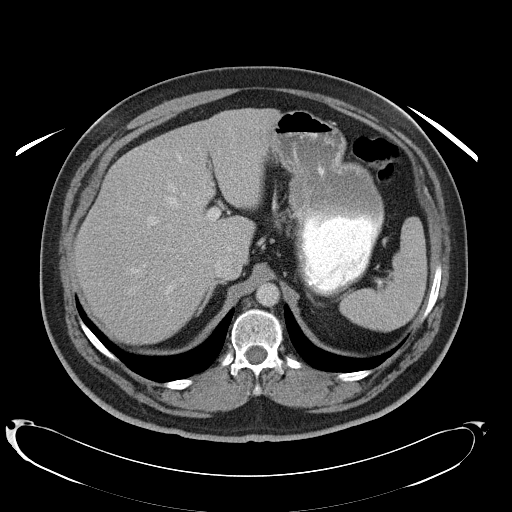
[im 74/126  bone]
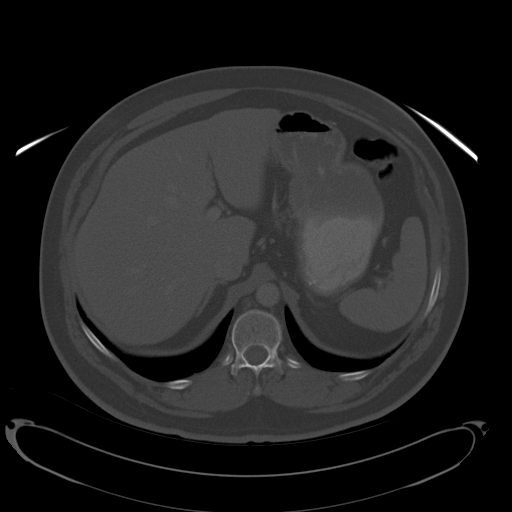
[im 81/126  soft-tissue]
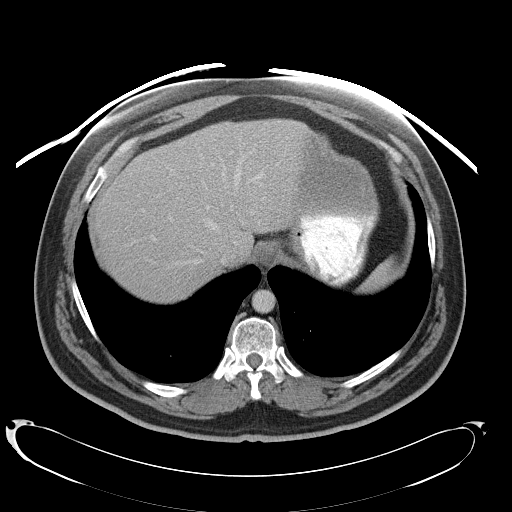
[im 96/126  soft-tissue]
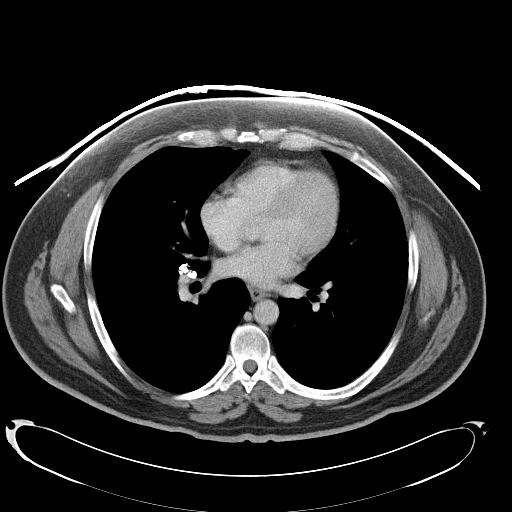
[im 103/126  soft-tissue]
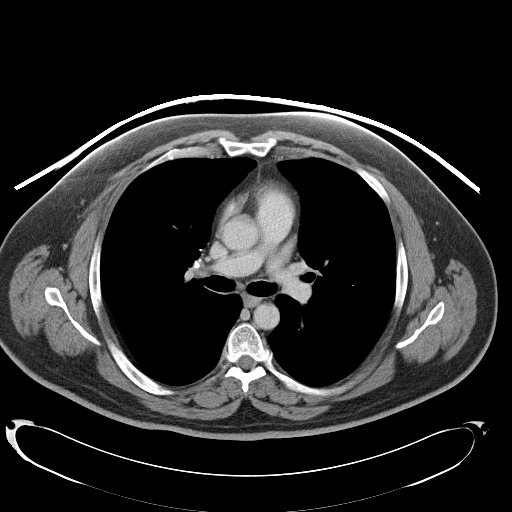
[im 111/126  soft-tissue]
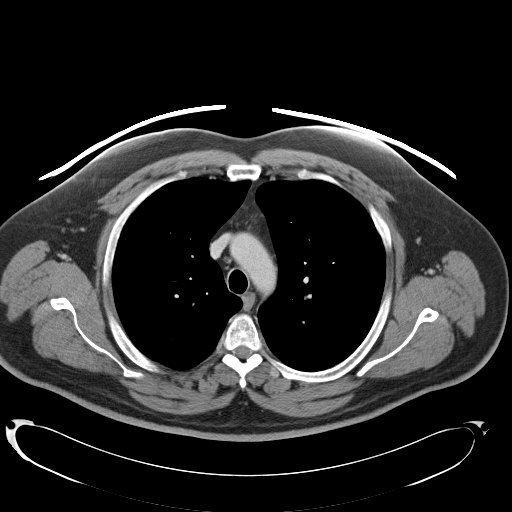
[im 118/126  soft-tissue]
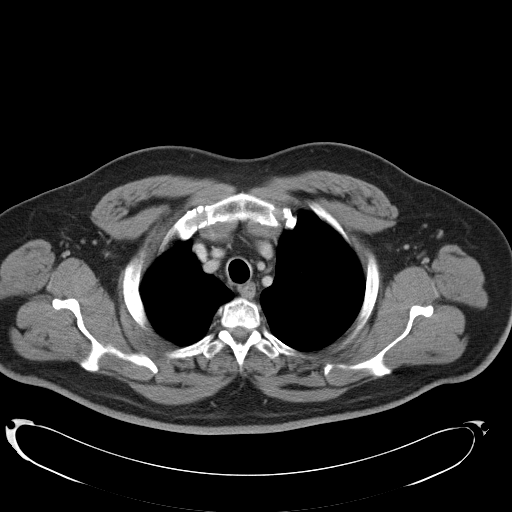

[Series 602: <mpr thick range> · coronal · 1.23mm/px · 3 of 94 slices shown]
[im 32/94  soft-tissue]
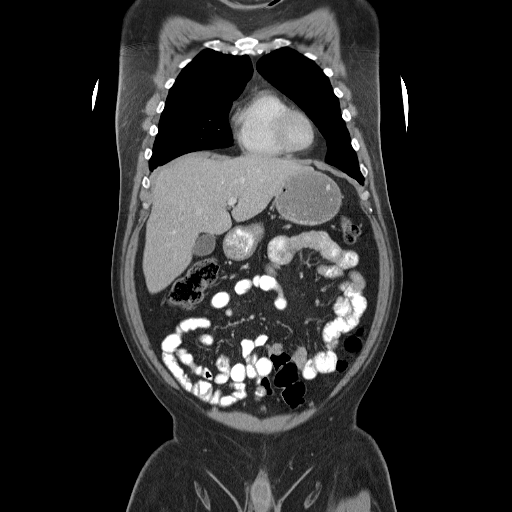
[im 42/94  soft-tissue]
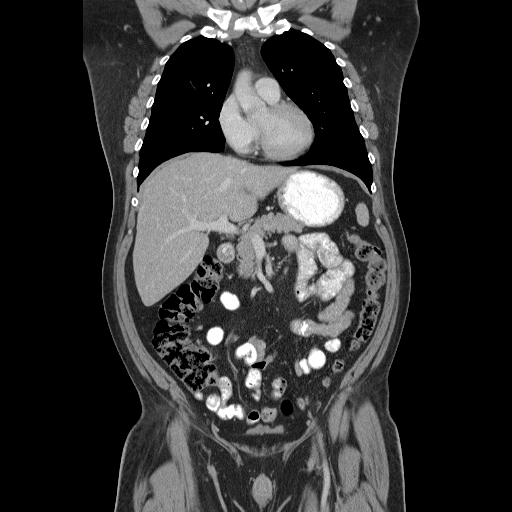
[im 52/94  soft-tissue]
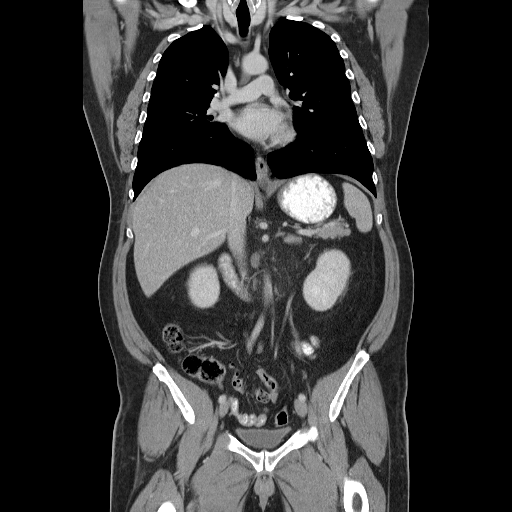

[17 of 46 positions shown; findings below may reference images not displayed]

FINDINGS: No enlarged axillary or supraclavicular lymph nodes.

There are no enlarged mediastinal or hilar lymph nodes.

Calcified granuloma in the right lung and calcified right hilar
lymph nodes again noted.

No pericardial or pleural effusion identified.

Stable subpleural nodule within the right lung which measures
mm, image number 25.  No new or enlarging pulmonary nodules or
masses identified.
IMPRESSION: 1.  Stable CT of the chest.
2.  No evidence for residual or recurrent lymphoma.

CT ABDOMEN
FINDINGS: The spleen appears normal.

Both of the adrenal glands appear normal.

The liver is normal.

Both of the adrenal glands appear normal.

The kidneys both appear normal.

The pancreas is normal.

There is no ascites within the upper abdomen.

The bowel loops have a normal course and caliber without
obstruction.

Review of the visualized osseous structures shows no worrisome
lytic or sclerotic lesions.
IMPRESSION: 1.  There is no upper abdominal mass or adenopathy.

CT PELVIS
FINDINGS: No enlarged inguinal lymph nodes.

There is no enlarged pelvic lymph nodes.

No pelvic mass identified.

The urinary bladder is normal.

The pelvic bowel loops are unremarkable.

Review of the visualized osseous structures is negative for lytic
or sclerotic lesions.
IMPRESSION: 1.  No mass or adenopathy.

## 2009-11-28 ENCOUNTER — Encounter: Payer: Self-pay | Admitting: Gastroenterology

## 2009-12-04 LAB — COMPREHENSIVE METABOLIC PANEL
ALT: 22 U/L (ref 0–53)
AST: 15 U/L (ref 0–37)
CO2: 21 mEq/L (ref 19–32)
Chloride: 100 mEq/L (ref 96–112)
Sodium: 135 mEq/L (ref 135–145)
Total Bilirubin: 0.4 mg/dL (ref 0.3–1.2)
Total Protein: 6.2 g/dL (ref 6.0–8.3)

## 2009-12-04 LAB — CBC WITH DIFFERENTIAL/PLATELET
BASO%: 0.1 % (ref 0.0–2.0)
EOS%: 4.7 % (ref 0.0–7.0)
LYMPH%: 22.1 % (ref 14.0–49.0)
MCH: 30.3 pg (ref 27.2–33.4)
MCHC: 35.4 g/dL (ref 32.0–36.0)
MONO#: 0.7 10*3/uL (ref 0.1–0.9)
MONO%: 8.7 % (ref 0.0–14.0)
Platelets: 190 10*3/uL (ref 140–400)
RBC: 4.98 10*6/uL (ref 4.20–5.82)
WBC: 8.5 10*3/uL (ref 4.0–10.3)

## 2009-12-04 LAB — LACTATE DEHYDROGENASE: LDH: 153 U/L (ref 94–250)

## 2009-12-12 ENCOUNTER — Ambulatory Visit: Payer: Self-pay | Admitting: Family Medicine

## 2009-12-12 DIAGNOSIS — K029 Dental caries, unspecified: Secondary | ICD-10-CM | POA: Insufficient documentation

## 2009-12-12 DIAGNOSIS — B353 Tinea pedis: Secondary | ICD-10-CM | POA: Insufficient documentation

## 2009-12-12 LAB — CONVERTED CEMR LAB
Cholesterol, target level: 200 mg/dL
HDL goal, serum: 40 mg/dL
LDL Goal: 100 mg/dL

## 2009-12-13 ENCOUNTER — Encounter: Payer: Self-pay | Admitting: Family Medicine

## 2010-01-30 ENCOUNTER — Ambulatory Visit: Payer: Self-pay | Admitting: Internal Medicine

## 2010-02-04 LAB — CBC WITH DIFFERENTIAL/PLATELET
Eosinophils Absolute: 0.3 10*3/uL (ref 0.0–0.5)
MONO#: 0.8 10*3/uL (ref 0.1–0.9)
NEUT#: 7.1 10*3/uL — ABNORMAL HIGH (ref 1.5–6.5)
RBC: 4.9 10*6/uL (ref 4.20–5.82)
RDW: 12.7 % (ref 11.0–14.6)
WBC: 9.7 10*3/uL (ref 4.0–10.3)
nRBC: 0 % (ref 0–0)

## 2010-04-02 ENCOUNTER — Ambulatory Visit: Payer: Self-pay | Admitting: Internal Medicine

## 2010-04-03 ENCOUNTER — Encounter: Payer: Self-pay | Admitting: Family Medicine

## 2010-04-03 ENCOUNTER — Encounter: Payer: Self-pay | Admitting: Gastroenterology

## 2010-04-03 LAB — CBC WITH DIFFERENTIAL/PLATELET
Basophils Absolute: 0 10*3/uL (ref 0.0–0.1)
Eosinophils Absolute: 0.4 10*3/uL (ref 0.0–0.5)
HGB: 15.1 g/dL (ref 13.0–17.1)
MCV: 88.3 fL (ref 79.3–98.0)
MONO%: 6.8 % (ref 0.0–14.0)
NEUT#: 6.9 10*3/uL — ABNORMAL HIGH (ref 1.5–6.5)
RDW: 12.9 % (ref 11.0–14.6)

## 2010-04-03 LAB — COMPREHENSIVE METABOLIC PANEL
Albumin: 4.1 g/dL (ref 3.5–5.2)
BUN: 12 mg/dL (ref 6–23)
Calcium: 9.5 mg/dL (ref 8.4–10.5)
Chloride: 102 mEq/L (ref 96–112)
Glucose, Bld: 189 mg/dL — ABNORMAL HIGH (ref 70–99)
Potassium: 4.2 mEq/L (ref 3.5–5.3)

## 2010-04-03 LAB — LACTATE DEHYDROGENASE: LDH: 140 U/L (ref 94–250)

## 2010-05-30 ENCOUNTER — Ambulatory Visit: Payer: Self-pay | Admitting: Internal Medicine

## 2010-05-30 ENCOUNTER — Ambulatory Visit (HOSPITAL_COMMUNITY): Admission: RE | Admit: 2010-05-30 | Discharge: 2010-05-30 | Payer: Self-pay | Admitting: Internal Medicine

## 2010-05-30 IMAGING — CT CT ABD-PELV W/ CM
2 of 5 series · 17 of 46 positions shown, 19 images · IV contrast (omnipaque)
Comparison: CT [DATE]

CT CHEST

CLINICAL DATA: Non Hodgkin's lymphoma.  Chemotherapy in progress.

CT CHEST, ABDOMEN AND PELVIS WITH CONTRAST
TECHNIQUE: Multidetector CT imaging of the chest, abdomen and
pelvis was performed following the standard protocol during bolus
administration of intravenous contrast.
Contrast: 100 ml Omnipaque 300

[Series 2: cap with st · axial · 0.86mm/px · z∈[-592,-38]mm · 14 of 125 slices shown, 16 images]
[im 7/125  soft-tissue]
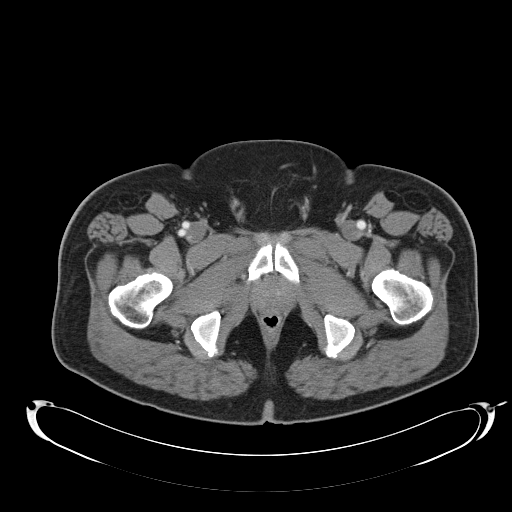
[im 7/125  bone]
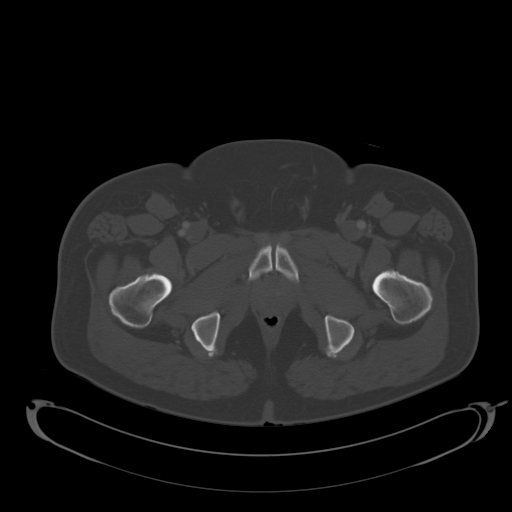
[im 14/125  soft-tissue]
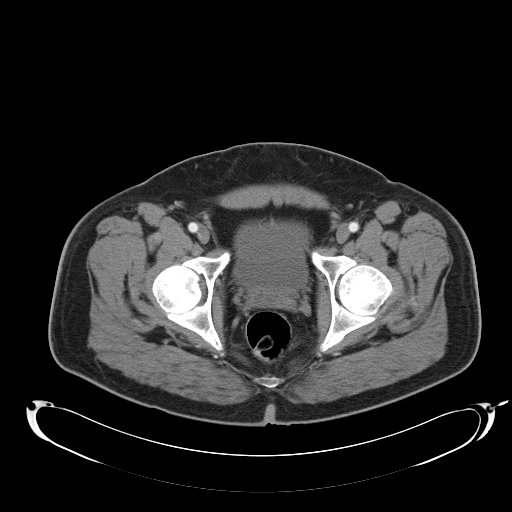
[im 28/125  soft-tissue]
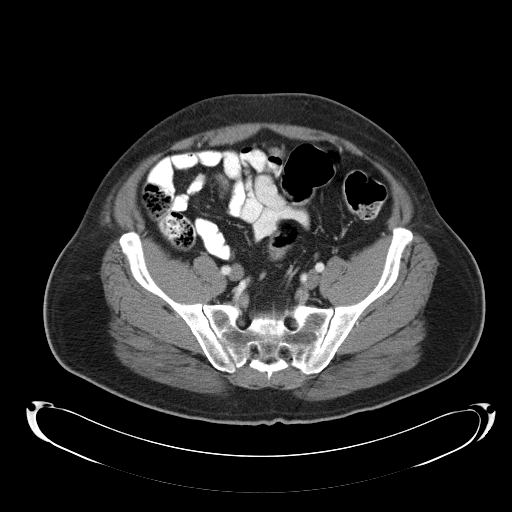
[im 35/125  soft-tissue]
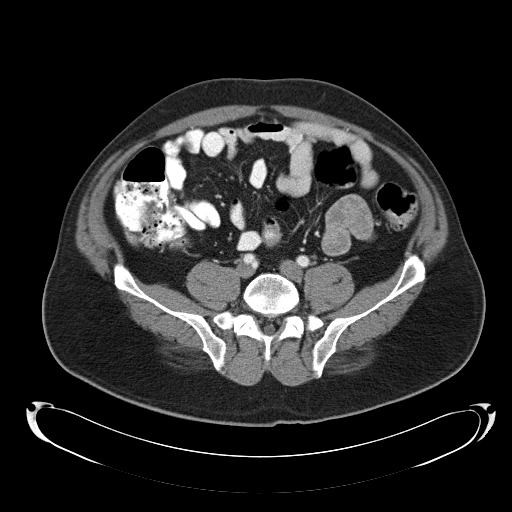
[im 42/125  soft-tissue]
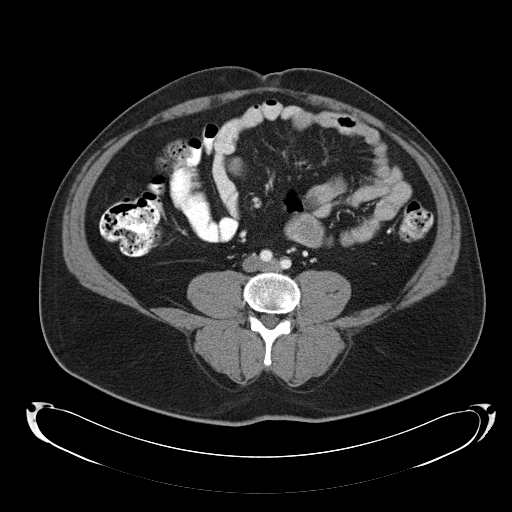
[im 49/125  soft-tissue]
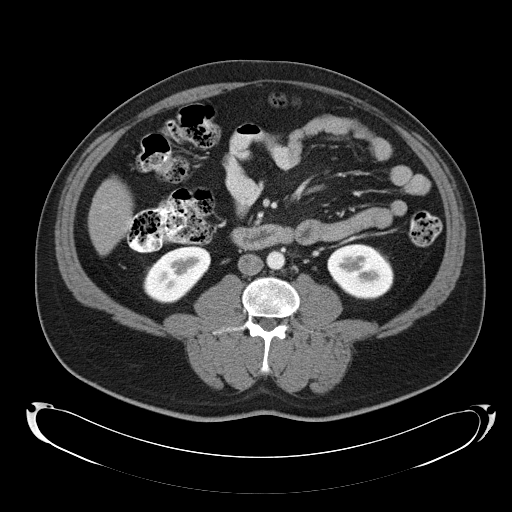
[im 56/125  soft-tissue]
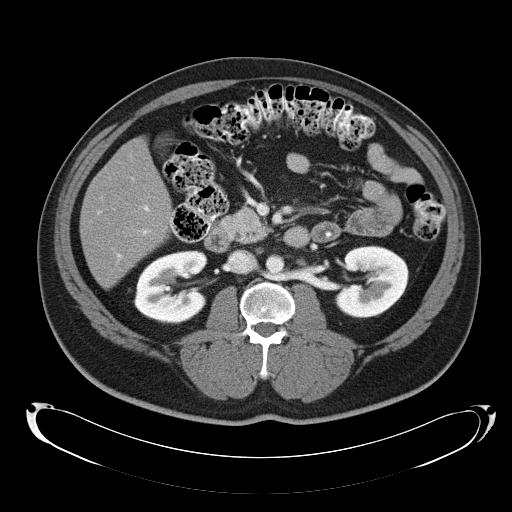
[im 69/125  soft-tissue]
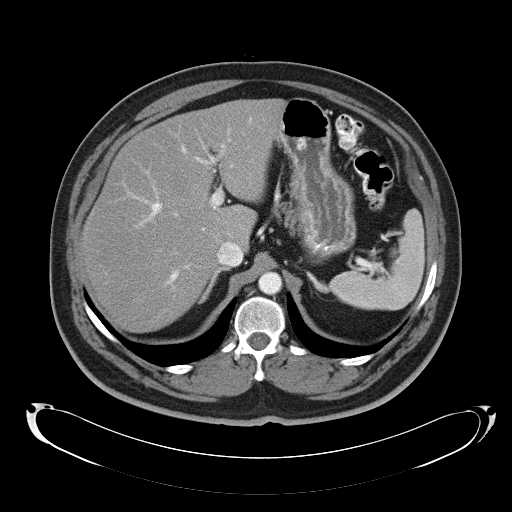
[im 76/125  soft-tissue]
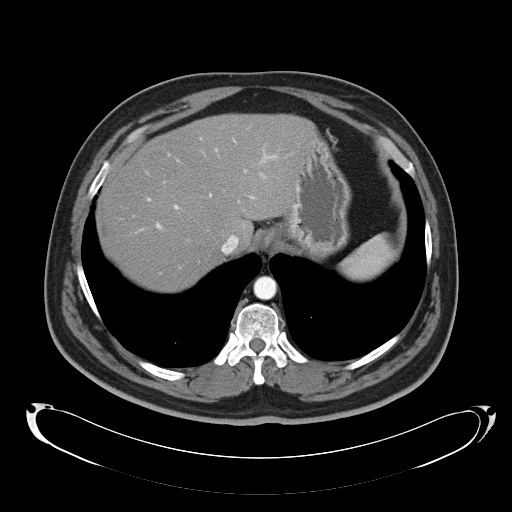
[im 76/125  bone]
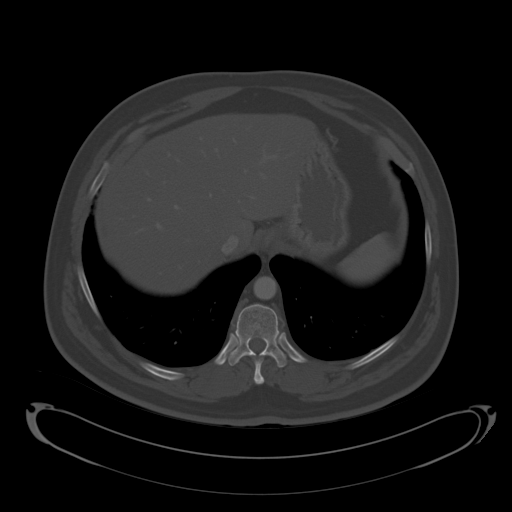
[im 83/125  soft-tissue]
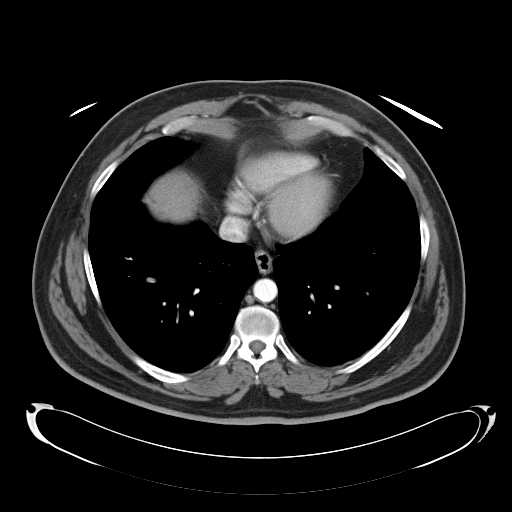
[im 90/125  soft-tissue]
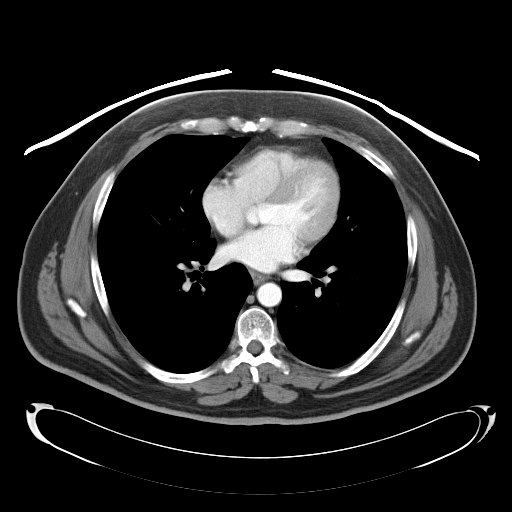
[im 97/125  soft-tissue]
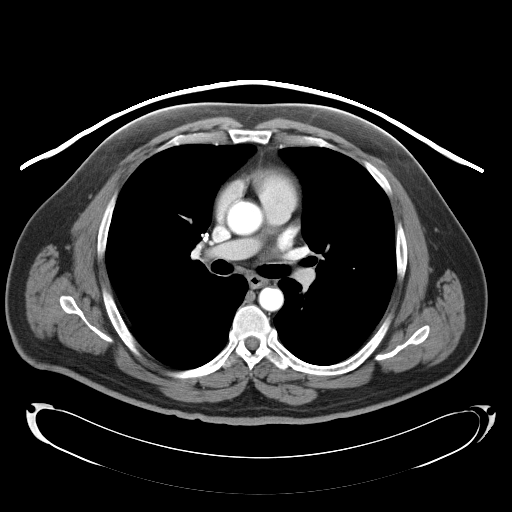
[im 111/125  soft-tissue]
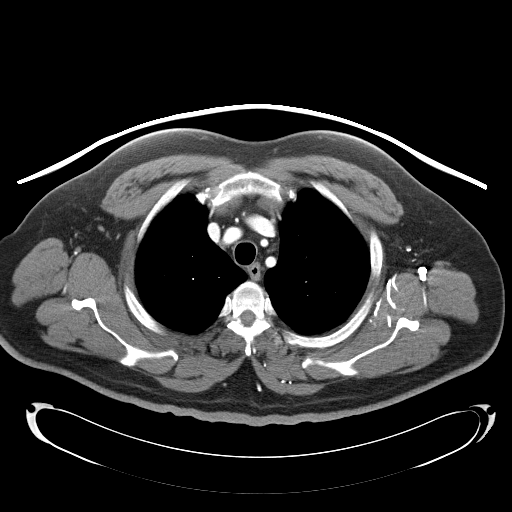
[im 118/125  soft-tissue]
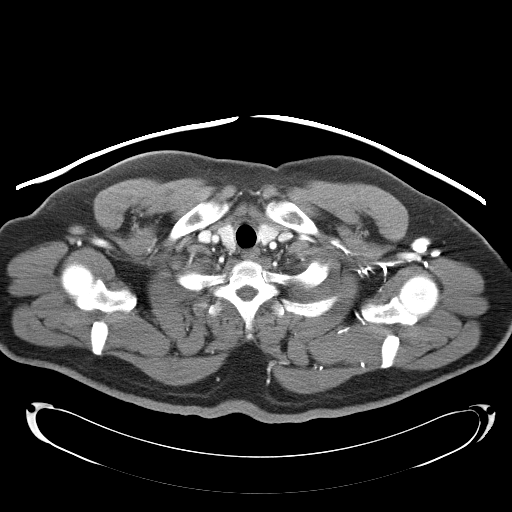

[Series 602: coronal images · coronal · 1.22mm/px · 3 of 107 slices shown]
[im 36/107  soft-tissue]
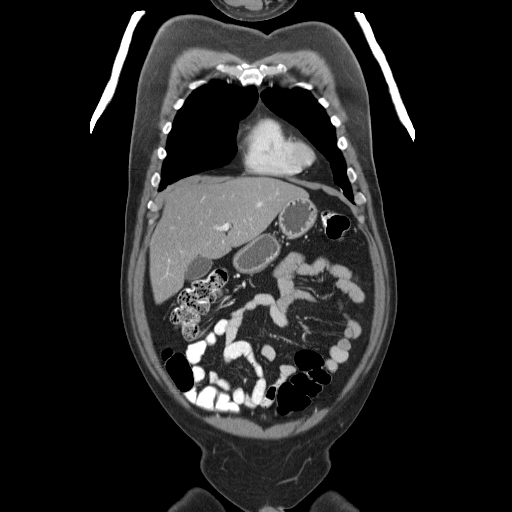
[im 48/107  soft-tissue]
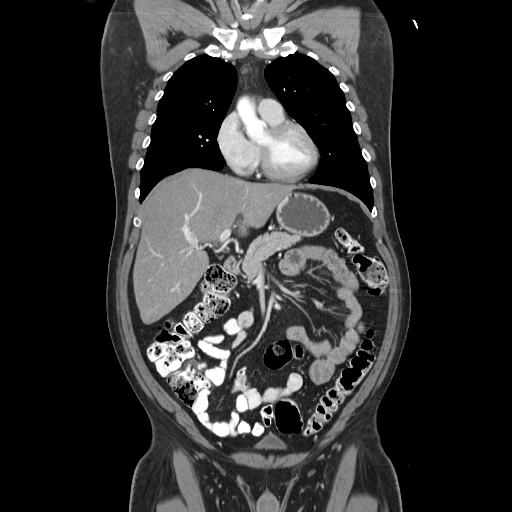
[im 59/107  soft-tissue]
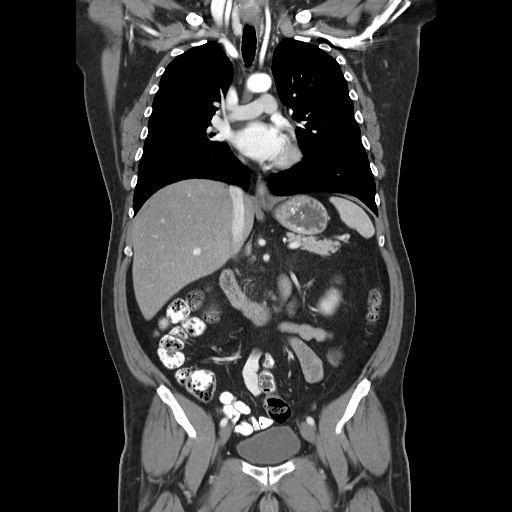

[17 of 46 positions shown; findings below may reference images not displayed]

FINDINGS: No evidence of axillary or supraclavicular
lymphadenopathy.  No mediastinal or hilar lymphadenopathy.  No
pericardial effusion.

Review of the lung parenchyma demonstrates several small nodules
along the left oblique fissure which are not changed in size
compared to prior.  No new or suspicious pulmonary nodules.
Airways are normal.  There is a calcified granuloma within the
right middle lobe.  Airways are normal.
IMPRESSION: No evidence of lymphoma recurrence of pneumothorax.

CT ABDOMEN AND PELVIS
FINDINGS: Gallbladder no focal hepatic lesion.  The gallbladder,
pancreas, spleen, adrenal glands, and kidneys are normal.

The stomach, small bowel, and colon are normal.

Abdominal aorta is normal caliber.  No evidence retroperitoneal
lymphadenopathy.  Retro aortic left renal vein is noted.

No free fluid the pelvis.  Bladder prostate gland are normal.  No
evidence of pelvic lymphadenopathy.

Review of  bone windows demonstrates no aggressive osseous lesions.
IMPRESSION: No evidence of lymphoma recurrence within the abdomen or pelvis.

## 2010-06-03 ENCOUNTER — Encounter: Payer: Self-pay | Admitting: Family Medicine

## 2010-06-03 ENCOUNTER — Encounter: Payer: Self-pay | Admitting: Gastroenterology

## 2010-06-03 LAB — CBC WITH DIFFERENTIAL/PLATELET
Basophils Absolute: 0 10*3/uL (ref 0.0–0.1)
EOS%: 2.3 % (ref 0.0–7.0)
Eosinophils Absolute: 0.2 10*3/uL (ref 0.0–0.5)
HGB: 15.5 g/dL (ref 13.0–17.1)
MCH: 31 pg (ref 27.2–33.4)
MONO#: 0.9 10*3/uL (ref 0.1–0.9)
NEUT#: 7.1 10*3/uL — ABNORMAL HIGH (ref 1.5–6.5)
RDW: 13 % (ref 11.0–14.6)
WBC: 10 10*3/uL (ref 4.0–10.3)
lymph#: 1.7 10*3/uL (ref 0.9–3.3)

## 2010-06-03 LAB — COMPREHENSIVE METABOLIC PANEL
ALT: 20 U/L (ref 0–53)
CO2: 20 mEq/L (ref 19–32)
Calcium: 9.7 mg/dL (ref 8.4–10.5)
Chloride: 100 mEq/L (ref 96–112)
Creatinine, Ser: 0.68 mg/dL (ref 0.40–1.50)
Glucose, Bld: 288 mg/dL — ABNORMAL HIGH (ref 70–99)
Sodium: 135 mEq/L (ref 135–145)
Total Bilirubin: 0.6 mg/dL (ref 0.3–1.2)
Total Protein: 7 g/dL (ref 6.0–8.3)

## 2010-06-03 LAB — LACTATE DEHYDROGENASE: LDH: 171 U/L (ref 94–250)

## 2010-07-26 ENCOUNTER — Ambulatory Visit: Payer: Self-pay | Admitting: Family Medicine

## 2010-07-26 LAB — CONVERTED CEMR LAB
ALT: 22 units/L (ref 0–53)
AST: 13 units/L (ref 0–37)
Basophils Absolute: 0 10*3/uL (ref 0.0–0.1)
Basophils Relative: 0 % (ref 0–1)
CO2: 26 meq/L (ref 19–32)
Calcium: 9.2 mg/dL (ref 8.4–10.5)
Chloride: 99 meq/L (ref 96–112)
Direct LDL: 147 mg/dL — ABNORMAL HIGH
Lymphocytes Relative: 23 % (ref 12–46)
MCHC: 33.7 g/dL (ref 30.0–36.0)
Neutro Abs: 6.7 10*3/uL (ref 1.7–7.7)
Platelets: 138 10*3/uL — ABNORMAL LOW (ref 150–400)
RDW: 13.2 % (ref 11.5–15.5)
Sodium: 135 meq/L (ref 135–145)
Total Bilirubin: 0.4 mg/dL (ref 0.3–1.2)
Total Protein: 6.5 g/dL (ref 6.0–8.3)

## 2010-07-29 ENCOUNTER — Encounter: Payer: Self-pay | Admitting: Family Medicine

## 2010-07-30 ENCOUNTER — Ambulatory Visit: Payer: Self-pay | Admitting: Internal Medicine

## 2010-08-01 ENCOUNTER — Encounter: Payer: Self-pay | Admitting: Family Medicine

## 2010-08-01 LAB — LACTATE DEHYDROGENASE: LDH: 137 U/L (ref 94–250)

## 2010-08-01 LAB — CBC WITH DIFFERENTIAL/PLATELET
BASO%: 0.3 % (ref 0.0–2.0)
HCT: 45.3 % (ref 38.4–49.9)
LYMPH%: 15.3 % (ref 14.0–49.0)
MCH: 31.1 pg (ref 27.2–33.4)
MCHC: 34.6 g/dL (ref 32.0–36.0)
MCV: 89.9 fL (ref 79.3–98.0)
MONO%: 8.5 % (ref 0.0–14.0)
NEUT%: 73.4 % (ref 39.0–75.0)
Platelets: 246 10*3/uL (ref 140–400)
RBC: 5.03 10*6/uL (ref 4.20–5.82)

## 2010-08-01 LAB — COMPREHENSIVE METABOLIC PANEL
ALT: 21 U/L (ref 0–53)
Alkaline Phosphatase: 80 U/L (ref 39–117)
Creatinine, Ser: 0.76 mg/dL (ref 0.40–1.50)
Sodium: 133 mEq/L — ABNORMAL LOW (ref 135–145)
Total Bilirubin: 0.7 mg/dL (ref 0.3–1.2)
Total Protein: 6.6 g/dL (ref 6.0–8.3)

## 2010-08-19 ENCOUNTER — Emergency Department (HOSPITAL_COMMUNITY): Admission: EM | Admit: 2010-08-19 | Discharge: 2010-08-19 | Payer: Self-pay | Admitting: Emergency Medicine

## 2010-08-21 ENCOUNTER — Telehealth: Payer: Self-pay | Admitting: Family Medicine

## 2010-08-23 ENCOUNTER — Telehealth: Payer: Self-pay | Admitting: *Deleted

## 2010-09-30 ENCOUNTER — Ambulatory Visit: Payer: Self-pay | Admitting: Internal Medicine

## 2010-10-02 ENCOUNTER — Encounter: Payer: Self-pay | Admitting: Gastroenterology

## 2010-10-02 ENCOUNTER — Encounter: Payer: Self-pay | Admitting: Family Medicine

## 2010-10-02 LAB — CBC WITH DIFFERENTIAL/PLATELET
BASO%: 0.1 % (ref 0.0–2.0)
Basophils Absolute: 0 10*3/uL (ref 0.0–0.1)
EOS%: 3.6 % (ref 0.0–7.0)
HGB: 16.3 g/dL (ref 13.0–17.1)
MCH: 30.5 pg (ref 27.2–33.4)
MCHC: 35.2 g/dL (ref 32.0–36.0)
MCV: 86.7 fL (ref 79.3–98.0)
MONO%: 8.4 % (ref 0.0–14.0)
RBC: 5.34 10*6/uL (ref 4.20–5.82)
RDW: 12.6 % (ref 11.0–14.6)
lymph#: 1.5 10*3/uL (ref 0.9–3.3)
nRBC: 0 % (ref 0–0)

## 2010-10-02 LAB — COMPREHENSIVE METABOLIC PANEL
AST: 13 U/L (ref 0–37)
Albumin: 4.2 g/dL (ref 3.5–5.2)
Alkaline Phosphatase: 97 U/L (ref 39–117)
Glucose, Bld: 318 mg/dL — ABNORMAL HIGH (ref 70–99)
Potassium: 4.2 mEq/L (ref 3.5–5.3)
Sodium: 134 mEq/L — ABNORMAL LOW (ref 135–145)
Total Bilirubin: 0.5 mg/dL (ref 0.3–1.2)
Total Protein: 6.6 g/dL (ref 6.0–8.3)

## 2010-11-26 ENCOUNTER — Ambulatory Visit: Payer: Self-pay | Admitting: Internal Medicine

## 2010-11-27 ENCOUNTER — Encounter: Payer: Self-pay | Admitting: Gastroenterology

## 2010-11-27 ENCOUNTER — Encounter: Payer: Self-pay | Admitting: Family Medicine

## 2010-11-27 LAB — CBC WITH DIFFERENTIAL/PLATELET
BASO%: 0.1 % (ref 0.0–2.0)
EOS%: 4.8 % (ref 0.0–7.0)
HCT: 45 % (ref 38.4–49.9)
MCH: 30.4 pg (ref 27.2–33.4)
MCHC: 35.8 g/dL (ref 32.0–36.0)
MCV: 85.1 fL (ref 79.3–98.0)
MONO%: 8.2 % (ref 0.0–14.0)
NEUT%: 65.3 % (ref 39.0–75.0)
lymph#: 1.7 10*3/uL (ref 0.9–3.3)

## 2010-11-27 LAB — COMPREHENSIVE METABOLIC PANEL
ALT: 20 U/L (ref 0–53)
AST: 15 U/L (ref 0–37)
CO2: 21 mEq/L (ref 19–32)
Calcium: 9.1 mg/dL (ref 8.4–10.5)
Chloride: 100 mEq/L (ref 96–112)
Sodium: 133 mEq/L — ABNORMAL LOW (ref 135–145)
Total Bilirubin: 0.5 mg/dL (ref 0.3–1.2)
Total Protein: 6.3 g/dL (ref 6.0–8.3)

## 2010-11-27 LAB — LACTATE DEHYDROGENASE: LDH: 157 U/L (ref 94–250)

## 2010-12-20 ENCOUNTER — Ambulatory Visit (HOSPITAL_COMMUNITY)
Admission: RE | Admit: 2010-12-20 | Discharge: 2010-12-20 | Payer: Self-pay | Source: Home / Self Care | Attending: Internal Medicine | Admitting: Internal Medicine

## 2010-12-20 IMAGING — CT CT CHEST W/ CM
2 of 5 series · 17 of 46 positions shown, 19 images · IV contrast (agent unspecified)
Comparison: [DATE]

CT CHEST

CLINICAL DATA: History of non-Hodgkins lymphoma.  Diagnosed
[DATE].  Chemotherapy in progress.  No complaints.

CT CHEST, ABDOMEN AND PELVIS WITH CONTRAST
TECHNIQUE: Contiguous axial images of the chest abdomen and pelvis
were obtained after IV contrast administration.
Contrast: 100  ml [LQ]

[Series 2: cap with st · axial · 0.78mm/px · z∈[-589,-59]mm · 14 of 120 slices shown, 16 images]
[im 7/120  soft-tissue]
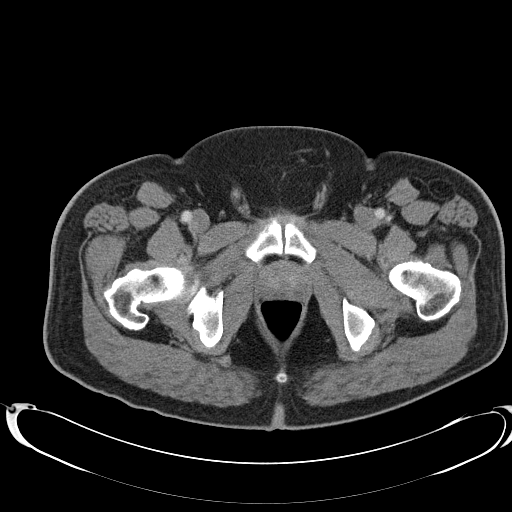
[im 7/120  bone]
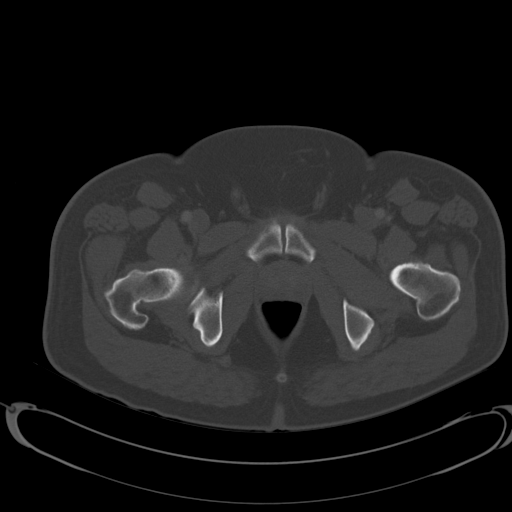
[im 14/120  soft-tissue]
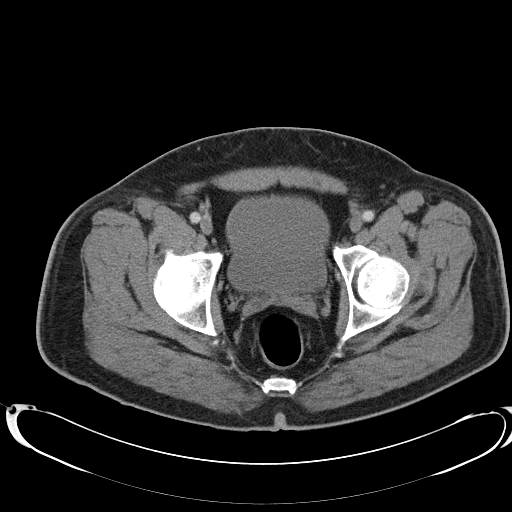
[im 27/120  soft-tissue]
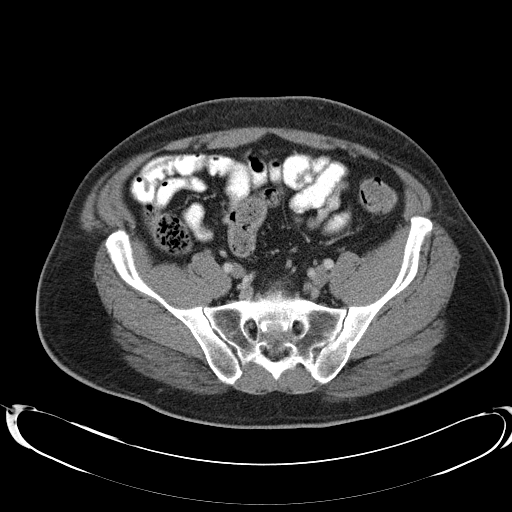
[im 34/120  soft-tissue]
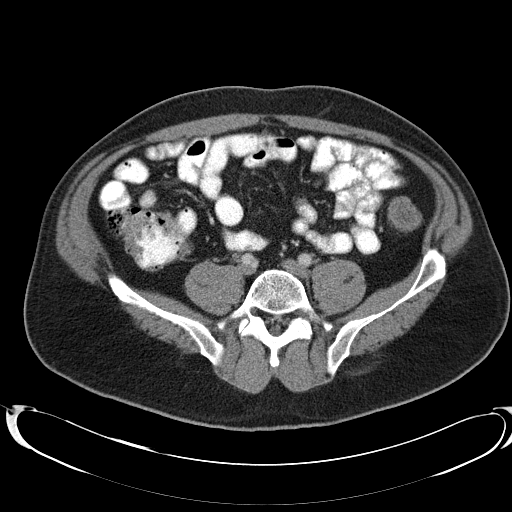
[im 40/120  soft-tissue]
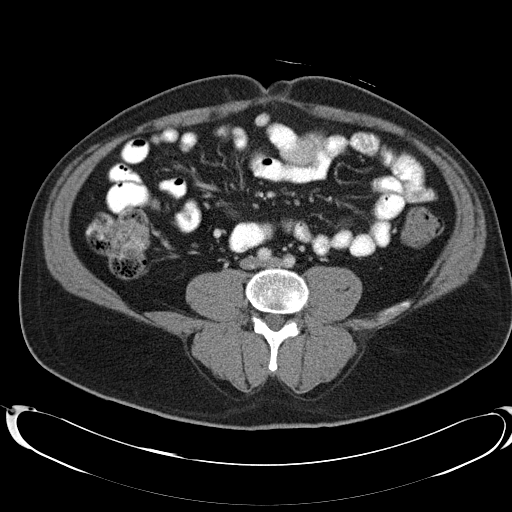
[im 47/120  soft-tissue]
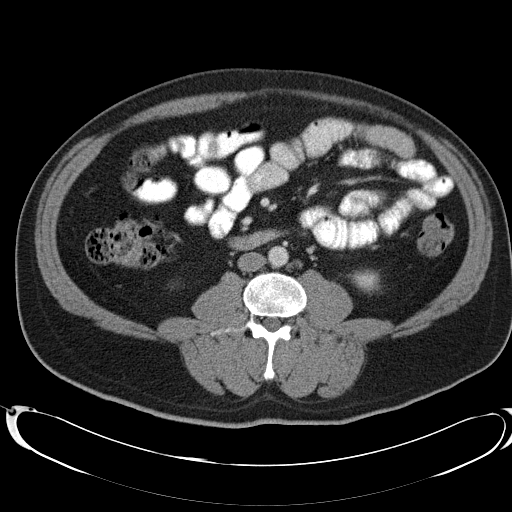
[im 53/120  soft-tissue]
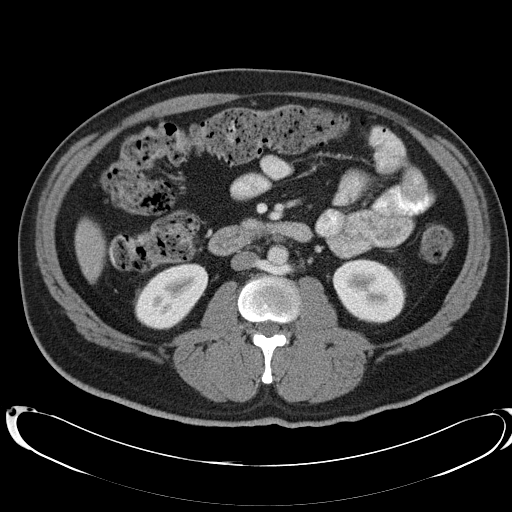
[im 67/120  soft-tissue]
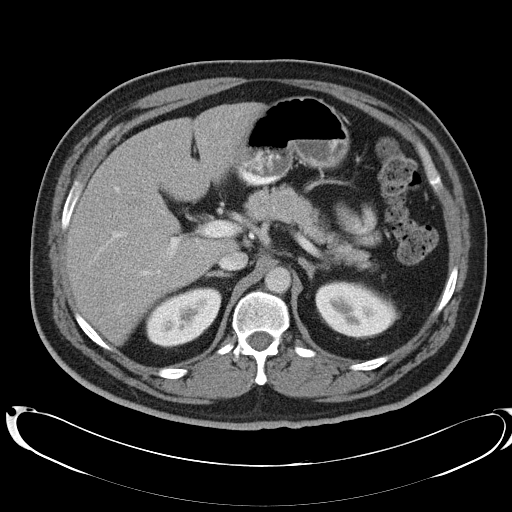
[im 73/120  soft-tissue]
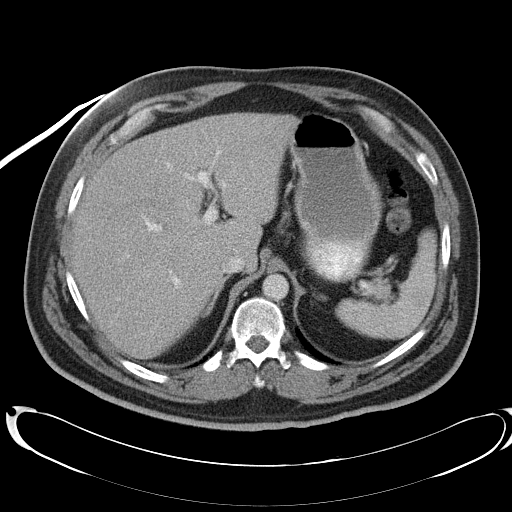
[im 73/120  bone]
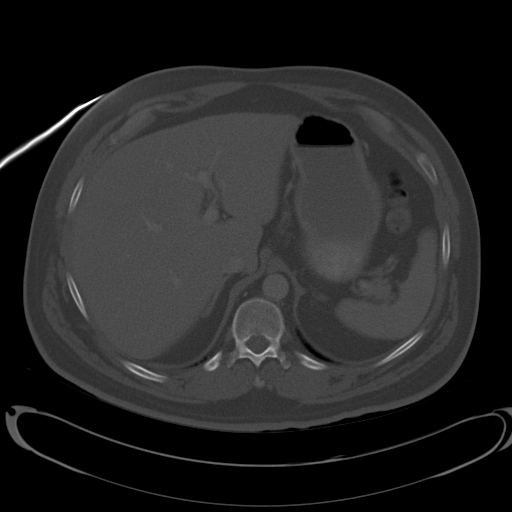
[im 80/120  soft-tissue]
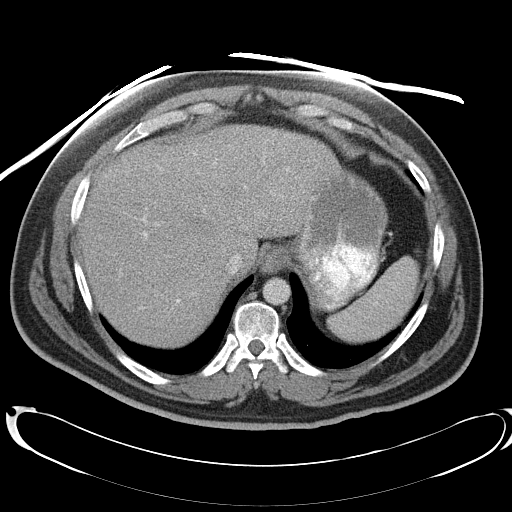
[im 86/120  soft-tissue]
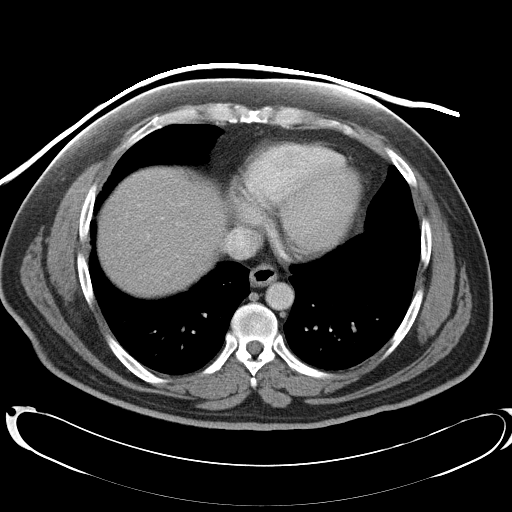
[im 93/120  soft-tissue]
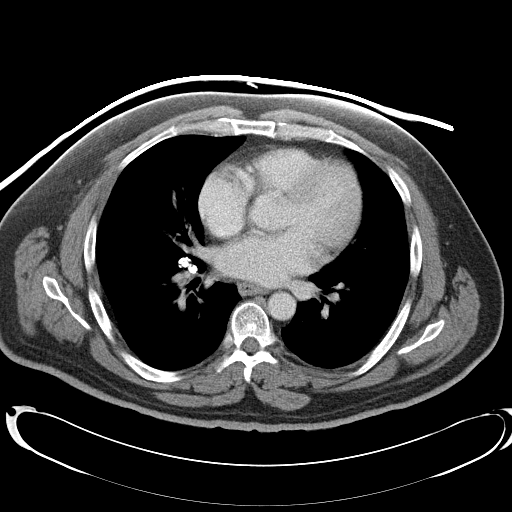
[im 106/120  soft-tissue]
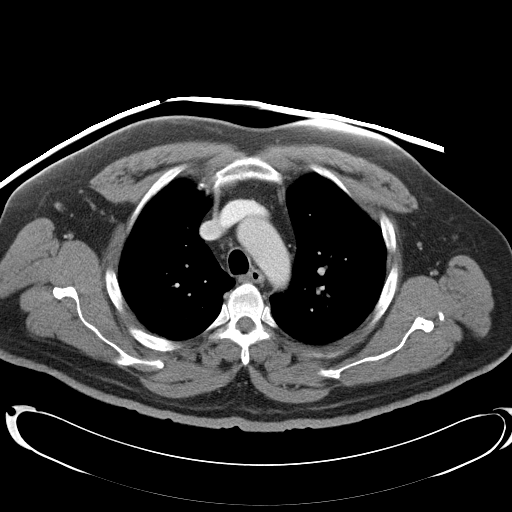
[im 113/120  soft-tissue]
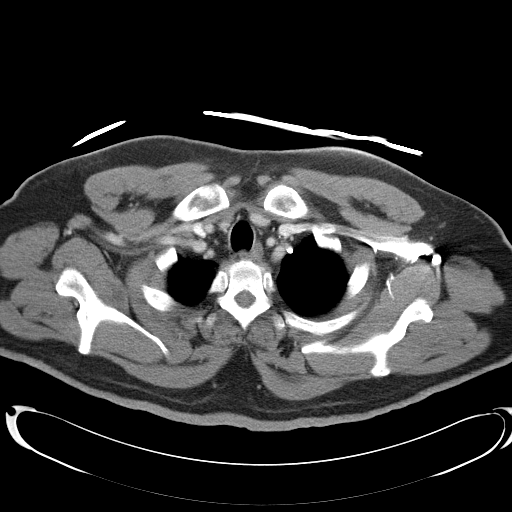

[Series 602: <mpr thick range> · coronal · 1.17mm/px · 3 of 96 slices shown]
[im 32/96  soft-tissue]
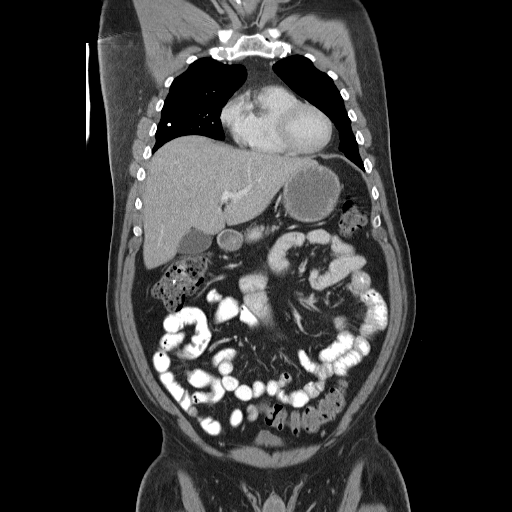
[im 43/96  soft-tissue]
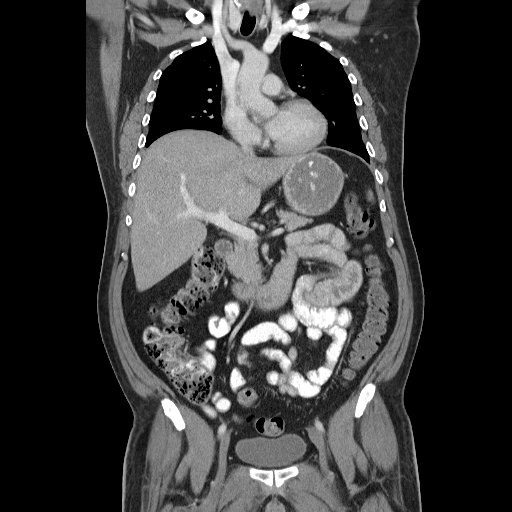
[im 53/96  soft-tissue]
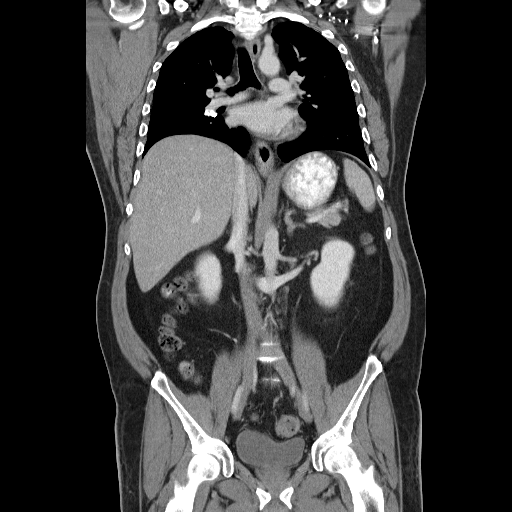

[17 of 46 positions shown; findings below may reference images not displayed]

FINDINGS: Lung windows demonstrate calcified granuloma at the
anterior right lung base.

Soft tissue windows demonstrate normal heart size without
pericardial or pleural effusion.

No axillary adenopathy.  Calcified right hilar lymph nodes
consistent with old granulomatous disease. No mediastinal or hilar
adenopathy.
IMPRESSION: No acute process or evidence of active lymphoma within the chest.

CT ABDOMEN AND PELVIS
FINDINGS: Normal liver, spleen, stomach, pancreas, gallbladder,
biliary tract, adrenal glands, kidneys.

Numerous right renal arteries.  Retroaortic left renal vein.
Stable small retroperitoneal lymph nodes without adenopathy.

Possible constipation, with large colonic stool burden. Normal
terminal ileum and appendix.  Normal small bowel without abdominal
ascites.

  No pelvic adenopathy.    Normal urinary bladder and prostate.  No
significant free fluid.  No acute osseous abnormality.
IMPRESSION: No acute process or evidence of active lymphoma within the
abdomen/pelvis.

## 2011-01-02 ENCOUNTER — Ambulatory Visit: Payer: Self-pay | Admitting: Internal Medicine

## 2011-01-06 ENCOUNTER — Encounter: Payer: Self-pay | Admitting: Gastroenterology

## 2011-01-06 LAB — COMPREHENSIVE METABOLIC PANEL
ALT: 15 U/L (ref 0–53)
AST: 12 U/L (ref 0–37)
Albumin: 3.9 g/dL (ref 3.5–5.2)
Alkaline Phosphatase: 98 U/L (ref 39–117)
BUN: 20 mg/dL (ref 6–23)
CO2: 24 mEq/L (ref 19–32)
Calcium: 9.3 mg/dL (ref 8.4–10.5)
Chloride: 100 mEq/L (ref 96–112)
Creatinine, Ser: 0.84 mg/dL (ref 0.40–1.50)
Glucose, Bld: 491 mg/dL — ABNORMAL HIGH (ref 70–99)
Potassium: 4.8 mEq/L (ref 3.5–5.3)
Sodium: 135 mEq/L (ref 135–145)
Total Bilirubin: 0.5 mg/dL (ref 0.3–1.2)
Total Protein: 6.4 g/dL (ref 6.0–8.3)

## 2011-01-06 LAB — CBC WITH DIFFERENTIAL/PLATELET
BASO%: 0.4 % (ref 0.0–2.0)
Basophils Absolute: 0 10*3/uL (ref 0.0–0.1)
EOS%: 6.2 % (ref 0.0–7.0)
HCT: 45.8 % (ref 38.4–49.9)
HGB: 16 g/dL (ref 13.0–17.1)
LYMPH%: 16 % (ref 14.0–49.0)
MCH: 30.8 pg (ref 27.2–33.4)
MCHC: 34.8 g/dL (ref 32.0–36.0)
MCV: 88.3 fL (ref 79.3–98.0)
MONO%: 7.1 % (ref 0.0–14.0)
NEUT%: 70.3 % (ref 39.0–75.0)
lymph#: 1.3 10*3/uL (ref 0.9–3.3)

## 2011-01-06 LAB — LACTATE DEHYDROGENASE: LDH: 134 U/L (ref 94–250)

## 2011-01-09 NOTE — Consult Note (Signed)
Summary: [facility]  [facility]   Imported By: Knox Royalty 06/29/2010 12:54:06  _____________________________________________________________________  External Attachment:    Type:   Image     Comment:   External Document

## 2011-01-09 NOTE — Assessment & Plan Note (Signed)
Summary: referral for eyes/kh   Vital Signs:  Patient profile:   50 year old male Height:      65 inches Weight:      198 pounds BMI:     33.07 Temp:     97.6 degrees F oral Pulse rate:   84 / minute BP sitting:   138 / 84  (right arm) Cuff size:   regular  Vitals Entered By: Tessie Fass CMA (December 12, 2009 9:28 AM)  Serial Vital Signs/Assessments:  Time      Position  BP       Pulse  Resp  Temp     By                     128/78                         Paula Compton MD  Comments: manual R arm sitting By: Paula Compton MD   CC: check eyes, Lipid Management Pain Assessment Patient in pain? no        Primary Care Provider:  Paula Compton MD  CC:  check eyes and Lipid Management.  History of Present Illness: Visit conducted in [place].   Patient presents as response to recent letter I had sent him regarding eye exam.  In the past he has appeared to have cataracts, and is overdue for DM eye exam.  Patient denies changes in his vision, denies scotomata or diplopia, does state that has to hold objects/book far from face to focus (presbyopia).  Denies chest pain or shortness of breath.  Regarding DM, has been checking sugars and finds erratic results (116 yest am, then 216 this am).  has recently started to exercise more often, doing push-ups and other resistance exercise.  Is trying to get healthier.     Lipid Management History:      Positive NCEP/ATP III risk factors include male age 77 years old or older, diabetes, HDL cholesterol less than 40, and hypertension.  Negative NCEP/ATP III risk factors include non-tobacco-user status.    Current Medications (verified): 1)  Benazepril Hcl 10 Mg Tabs (Benazepril Hcl) .... One Tablet By Mouth Once Daily 2)  Tramadol Hcl 50 Mg Tabs (Tramadol Hcl) .... Take One Tablet Every 6 Hours As Needed For Pain 3)  Metformin Hcl 1000 Mg Tabs (Metformin Hcl) .Marland Kitchen.. 1 Tab By Mouth Twice Daily For Diabetes. Label in Spanish Please 4)   Glucotrol 10 Mg Tabs (Glipizide) .... One Tab By Mouth Two Times A Day For Diabetes. Please Put Label and Instructions in Spanish 5)  Lancets  Misc (Lancets) .... Sig: Use With Glucometer in Home Once Daily.  Disp 1 Box 6)  Pravastatin Sodium 40 Mg Tabs (Pravastatin Sodium) .Marland Kitchen.. 1 Tab By Mouth Qhs For Cholesterol. Label in Spanish Please 7)  Viagra 50 Mg Tabs (Sildenafil Citrate) .Marland Kitchen.. 1 Tab By Mouth When Needed To Achieve Erection Label in Spanish Please 8)  Gabapentin 300 Mg Caps (Gabapentin) .... One Tab P.o. Qhs For 2 Weeks. Then, Increase To One Tab P.o. B.i.d. For 2 Weeks. Then, Increase To One Tab P.o. T.i.d. 9)  Ketoconazole 2 % Crea (Ketoconazole) .... Apply Once Daily To Feet After Bathing; Instructions in Spanish Disp 60gm Tube or Similar  Allergies (verified): No Known Drug Allergies  Family History: Reviewed history from 10/25/2008 and no changes required. Family History of Diabetes: Mother, Father  Social History: Reviewed history from 11/05/2009 and no  changes required. Married Daily Caffeine Use Illicit Drug Use - no  Update July 09, 2009:  Quit smoking 10 months ago.  Has not had alcohol in over 1 yr.  LIves with wife, daughter Huntley Dec and son Larene Pickett. No pets. Completed between 3 and 5 yrs of education. Exercises at least three times a week.   Physical Exam  General:  Well-developed,well-nourished,in no acute distress; alert,appropriate and cooperative throughout examination Eyes:  20/20 uncorrected for OD, OS, OU. vision grossly intact, pupils equal, pupils round, and pupils reactive to light.   Difficulty visualizing bilat retina due to opacity. Mouth:  moist mucus membranes.  Poor dentition, with apparent caries in L superior molars.  Neck:  No deformities, masses, or tenderness noted. Lungs:  Normal respiratory effort, chest expands symmetrically. Lungs are clear to auscultation, no crackles or wheezes. Heart:  Normal rate and regular rhythm. S1 and S2 normal  without gallop, murmur, click, rub or other extra sounds. Pulses:  Palpable dp pulses bilaterally.  Extremities:  DM foot exam done.  Some mild maceration between L 4th and 5th digits.   No pedal edema. Neurologic:  sensation feet grossly intact with light touch bilaterally.   Diabetes Management Exam:    Foot Exam (with socks and/or shoes not present):       Sensory-Pinprick/Light touch:          Left medial foot (L-4): normal          Left dorsal foot (L-5): normal          Left lateral foot (S-1): normal          Right medial foot (L-4): normal          Right dorsal foot (L-5): normal          Right lateral foot (S-1): normal       Sensory-Monofilament:          Left foot: normal          Right foot: normal       Inspection:          Left foot: normal          Right foot: normal   Impression & Recommendations:  Problem # 1:  DIABETES MELLITUS, TYPE II, UNCONTROLLED (ICD-250.02)  Takes meds intermittently. Talked about importance of taking meds regularly to achieve goals.  Encouraged on his new exercise regimen.  Recheck A1c, metabolic panel in fasting state.  His updated medication list for this problem includes:    Benazepril Hcl 10 Mg Tabs (Benazepril hcl) ..... One tablet by mouth once daily    Metformin Hcl 1000 Mg Tabs (Metformin hcl) .Marland Kitchen... 1 tab by mouth twice daily for diabetes. label in spanish please    Glucotrol 10 Mg Tabs (Glipizide) ..... One tab by mouth two times a day for diabetes. please put label and instructions in spanish  Orders: [facility]- Est  Level 4 (99214)Future Orders: Comp Met-[facility] ([id number]) ... 12/19/2010 A1C-[facility] ([id number]) ... 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   Orders: Ophthalmology Referral (Ophthalmology) [facility]- Est  Level 4 ([address]295)  Problem # 3:  HYPERLIPIDEMIA (ICD-272.4)  Previous LDL above 100; for recheck since he has been on statin since Oct, continues to take.  His updated  medication list for this problem includes:    Pravastatin Sodium 40 Mg Tabs (Pravastatin sodium) .Marland Kitchen... 1 tab by mouth qhs for cholesterol. label in spanish please  Orders: [facility]- Est  Level 4 ([address]214)Future  Orders: Lipid-[facility] ([id number]) ... 12/19/2010  Problem # 4:  UNSPECIFIED DENTAL CARIES (ICD-521.00)  Poor dentition in patient with uncontrolled DM, especially right suprior molars.  Referral fro dentist eval.  Orders: Dental Referral (Dentist) [facility]- Est  Level 4 (84132)  Problem # 5:  TINEA PEDIS (ICD-110.4)  Discussed use of ketaconazole for early tinea pedis between L 4th and 5th digits.  No other areas of concern. Disucssed daily foot exams. His updated medication list for this problem includes:    Ketoconazole 2 % Crea (Ketoconazole) .Marland Kitchen... Apply once daily to feet after bathing; instructions in spanish disp 60gm tube or similar  Orders: [facility]- Est  Level 4 (44010)  Problem # 6:  FOOT PAIN, BILATERAL (ICD-729.5) Reports that this is much improved, since he started gabapentin.  Uses the med sporadically, not often now.  Discussed its proper use. May not need it if problem improved and not taking any longer (raises question of whether diagnosis of neuropathy is correct.)  Complete Medication List: 1)  Benazepril Hcl 10 Mg Tabs (Benazepril hcl) .... One tablet by mouth once daily 2)  Tramadol Hcl 50 Mg Tabs (Tramadol hcl) .... Take one tablet every 6 hours as needed for pain 3)  Metformin Hcl 1000 Mg Tabs (Metformin hcl) .Marland Kitchen.. 1 tab by mouth twice daily for diabetes. label in spanish please 4)  Glucotrol 10 Mg Tabs (Glipizide) .... One tab by mouth two times a day for diabetes. please put label and instructions in spanish 5)  Lancets Misc (Lancets) .... Sig: use with glucometer in home once daily.  disp 1 box 6)  Pravastatin Sodium 40 Mg Tabs (Pravastatin sodium) .Marland Kitchen.. 1 tab by mouth qhs for cholesterol. label in spanish please 7)  Viagra 50 Mg Tabs (Sildenafil citrate) .Marland Kitchen.. 1 tab by  mouth when needed to achieve erection label in spanish please 8)  Gabapentin 300 Mg Caps (Gabapentin) .... One tab p.o. qhs for 2 weeks. then, increase to one tab p.o. b.i.d. for 2 weeks. then, increase to one tab p.o. t.i.d. 9)  Ketoconazole 2 % Crea (Ketoconazole) .... Apply once daily to feet after bathing; instructions in spanish disp 60gm tube or similar  Lipid Assessment/Plan:      Based on NCEP/ATP III, the patient's risk factor category is "history of diabetes".  The patient's lipid goals are as follows: Total cholesterol goal is 200; LDL cholesterol goal is 100; HDL cholesterol goal is 40; Triglyceride goal is 150.  His LDL cholesterol goal has not been met.  He has been counseled on adjunctive measures for lowering his cholesterol and has been provided with dietary instructions.     Patient Instructions: 1)  Fue un placer verle hoy.  Me alegro que esta' haciendo ejercicios! 2)  Le estoy remitiendo al dentista, tambien al ofthalmologo para evaluarle los ojos.  Esto es muy importante para la gente con diabetes. 3)  Estoy recetando una crema a la farmacia, para [employer] dedos de los pies una vez al dia despues de banarse.  4)  Vuelva para hacerse los laboratios en ayunas (sin comer ni tomar nada menos agua en la manana).  Le entro en [employer].  5)  SCHEDULE FOR FASTING LAB VISIT WHEN PT CAN COME IN;  6)  FOLLOW UP MD VISIT WITH Clinton Wahlberg IN 2 MONTHS Prescriptions: KETOCONAZOLE 2 % CREA (KETOCONAZOLE) Apply once daily to feet after bathing; Instructions in Spanish DISP 60gm tube or similar  #1 x 1   Entered and Authorized by:  Paula Compton MD   Signed by:   Paula Compton MD on 12/12/2009   Method used:   Electronically to        [facility] [phone]* (retail)       [address]       [place], [place]  [address]       Ph: [phone]       Fax: [phone]   RxID:   [id number]    Prevention & Chronic Care Immunizations   Influenza vaccine: Not  documented   Influenza vaccine deferral: Refused  (11/05/2009)    Tetanus booster: Not documented    Pneumococcal vaccine: Not documented   Pneumococcal vaccine deferral: Refused  (11/05/2009)  Other Screening   Smoking status: quit > 6 months  (11/05/2009)  Diabetes Mellitus   HgbA1C: 8.5  (11/05/2009)    Eye exam: Not documented    Foot exam: yes  (12/12/2009)   Foot exam action/deferral: Do today   High risk foot: Not documented   Foot care education: Not documented    Urine microalbumin/creatinine ratio: Not documented    Diabetes flowsheet reviewed?: Yes   Progress toward A1C goal: Unchanged  Lipids   Total Cholesterol: 220  (07/09/2009)   LDL: 113  (07/09/2009)   LDL Direct: Not documented   HDL: 39  (07/09/2009)   Triglycerides: 340  (07/09/2009)    SGOT (AST): 19  (07/09/2009)   SGPT (ALT): 28  (07/09/2009) CMP ordered    Alkaline phosphatase: 95  (07/09/2009)   Total bilirubin: 0.6  (07/09/2009)    Lipid flowsheet reviewed?: Yes   Progress toward LDL goal: Improved  Self-Management Support :   Personal Goals (by the next clinic visit) :     Personal A1C goal: 7  (11/05/2009)     Personal blood pressure goal: 130/80  (11/05/2009)     Personal LDL goal: 100  (11/05/2009)    Patient will work on the following items until the next clinic visit to reach self-care goals:     Medications and monitoring: take my medicines every day, check my blood sugar  (12/12/2009)     Eating: use fresh or frozen vegetables, eat baked foods instead of fried foods, eat fruit for snacks and desserts  (11/05/2009)     Activity: take a 30 minute walk every day  (12/12/2009)    Diabetes self-management support: Written self-care plan  (12/12/2009)   Diabetes care plan printed    Lipid self-management support: Written self-care plan  (12/12/2009)   Lipid self-care plan printed.

## 2011-01-09 NOTE — Letter (Signed)
Summary: [facility]  [facility]   Imported By: Sherian Rein 01/03/2011 08:53:21  _____________________________________________________________________  External Attachment:    Type:   Image     Comment:   External Document

## 2011-01-09 NOTE — Progress Notes (Signed)
Summary: Pt medicine   Phone Note Outgoing Call   Summary of Call: I call pt to let his knows about his medicine but no body answer I let a VM Initial call taken by: Marines Jean Rosenthal,  August 23, 2010 10:41 AM  Follow-up for Phone Call        we were going to tell him to take tylenol for HA or if not working, come back in Follow-up by: Golden Circle RN,  August 23, 2010 10:52 AM

## 2011-01-09 NOTE — Letter (Signed)
[facility]   Imported By: Sherian Rein 07/08/2010 14:22:41  _____________________________________________________________________  External Attachment:    Type:   Image     Comment:   External Document

## 2011-01-09 NOTE — Letter (Signed)
Summary: [facility]  [facility]   Imported By: Sherian Rein 10/18/2010 10:36:09  _____________________________________________________________________  External Attachment:    Type:   Image     Comment:   External Document

## 2011-01-09 NOTE — Letter (Signed)
Summary: Generic Letter  [facility]  [address]   [place], [place] [address]   Phone: [phone]  Fax: [phone]    12/13/2009  [facility] [address] [place], [place]  [address]  Estimado Sr. Vaile,  Fue un placer verle Surveyor, quantity.  Escribo con informacion [employer] referidos al ofthalmologo y al dentista que conversamos ayer.   Primero, tiene una cita marcada con el ofthalmologo, Dr. Nile Riggs, el jueves 13 de enero a las 9:50 de Engineer, site.  Es muy importante que lleve su tarjeta de [place] y un interprete si lo necesita.  Encuanto a la cita para Office manager, estoy incluyendo [place] lista de dentistas que [facility] [facility] [place].  Por favor haga una cita con uno de los dentistas en la lista.  La direccion de la oficina de Dr. Nile Riggs es:  Dr. Nile Riggs [address] [place]: [phone]    Sinceramente,      Paula Compton MD  Appended Document: Generic Letter I called patient at [phone] and discussed referral for ophthalmology on Jan 13th in the morning.  I told him I am mailing the address and the date/time to him in a letter, along with a list of Medicaid-participating dentists.  He acknowledges that he will go to visit on Jan 13th.  Paula Compton, MD

## 2011-01-09 NOTE — Consult Note (Signed)
[facility]   Imported By: De Nurse 08/20/2010 16:17:16  _____________________________________________________________________  External Attachment:    Type:   Image     Comment:   External Document

## 2011-01-09 NOTE — Assessment & Plan Note (Signed)
Summary: dental referral?/eo   Vital Signs:  Patient profile:   50 year old male Height:      65 inches Weight:      193 pounds BMI:     32.23 Temp:     98.4 degrees F oral Pulse rate:   86 / minute BP sitting:   130 / 79  (left arm) Cuff size:   regular  Vitals Entered By: Tessie Fass CMA (July 26, 2010 3:19 PM) CC: dental referral?   Primary Care Provider:  Paula Compton MD  CC:  dental referral?.  History of Present Illness: Visit conducted in Spanish.  [name] comes in today for concern about his dental health. Denies dental pain or discomfort, but would like to establish with a dentist who works with Medicaid.  He last saw a dentist over 1 yr ago.   I take the opportunity to discuss his diabetes care.  Continues to take Savila, nopal, and different natural herbs and shakes to control his sugars.  In reviewing his med list, it is apparent that he has not refilled meds and is unsure of what he is taking.  He did not bring med bottles in today.   Denies foot pain or blurred vision, denies chest pain.   Last dilated eye exam 5 months ago, told was okay.   Has been followed by Dr. Shirline Frees for non-Hodgkins lymphoma, receiving iv therapy every other month for total of 2 yrs, now has 14 months remaining in treatment.  Notices decrease in ability to have erections when he is on the treatment.  Cannot afford the viagra he used to take.   Nonsmoker.    Current Medications (verified): 1)  Benazepril Hcl 10 Mg Tabs (Benazepril Hcl) .... One Tablet By Mouth Once Daily 2)  Tramadol Hcl 50 Mg Tabs (Tramadol Hcl) .... Take One Tablet Every 6 Hours As Needed For Pain 3)  Metformin Hcl 1000 Mg Tabs (Metformin Hcl) .Marland Kitchen.. 1 Tab By Mouth Twice Daily For Diabetes. Label in Spanish Please 4)  Glucotrol 10 Mg Tabs (Glipizide) .... One Tab By Mouth Two Times A Day For Diabetes. Please Put Label and Instructions in Spanish 5)  Lancets  Misc (Lancets) .... Sig: Use With Glucometer in Home Once  Daily.  Disp 1 Box 6)  Pravastatin Sodium 40 Mg Tabs (Pravastatin Sodium) .Marland Kitchen.. 1 Tab By Mouth Qhs For Cholesterol. Label in Spanish Please 7)  Viagra 50 Mg Tabs (Sildenafil Citrate) .Marland Kitchen.. 1 Tab By Mouth When Needed To Achieve Erection Label in Spanish Please 8)  Aspirin 81 Mg Tbec (Aspirin) .Marland Kitchen.. 1 By Mouth Once Daily  Allergies (verified): No Known Drug Allergies  Physical Exam  General:  Well appearing, no apparent distress Eyes:  opacity L pupil with funduscopic exam.   Mouth:  Moist mucus membranes.  Visible erosions along R lower teeth, and particularly poor dentition R upper molars.  No gingival erythema or discharge/purulence. Clear oropharynx.  Neck:  No cervical adenopathy. Neck supple.  Lungs:  Normal respiratory effort, chest expands symmetrically. Lungs are clear to auscultation, no crackles or wheezes. Heart:  Normal rate and regular rhythm. S1 and S2 normal without gallop, murmur, click, rub or other extra sounds. Neurologic:  No LE edema.  No foot ulcerations or skin changes.  Palpable dp pulses bilat. Monofilament testing full sensation in all areas bilaterally.    Impression & Recommendations:  Problem # 1:  UNSPECIFIED DENTAL CARIES (ICD-521.00) Poor dental hygiene.  He has Medicaid, a sheet with list  of dentists who participate with Medicaid given to patient.    Problem # 2:  DIABETES MELLITUS, TYPE II, UNCONTROLLED (ICD-250.02) Last labs show elevated A1C (8.5% in Nov 2010); LDL 113 in Aug 2010.  For nonfasting labs today.  Reminded of aspirin recommendation. All chronic meds sent to [facility] pharmacy in [place].    His updated medication list for this problem includes:    Benazepril Hcl 10 Mg Tabs (Benazepril hcl) ..... One tablet by mouth once daily    Metformin Hcl 1000 Mg Tabs (Metformin hcl) .Marland Kitchen... 1 tab by mouth twice daily for diabetes. label in spanish please    Glucotrol 10 Mg Tabs (Glipizide) ..... One tab by mouth two times a day for diabetes. please put  label and instructions in spanish    Aspirin 81 Mg Tbec (Aspirin) .Marland Kitchen... 1 by mouth once daily  Orders: Comp Met-[facility] (81191-47829) A1C-[facility] (56213) Direct LDL-[facility] (08657-84696) [facility]- Est  Level 4 (29528)  Complete Medication List: 1)  Benazepril Hcl 10 Mg Tabs (Benazepril hcl) .... One tablet by mouth once daily 2)  Tramadol Hcl 50 Mg Tabs (Tramadol hcl) .... Take one tablet every 6 hours as needed for pain 3)  Metformin Hcl 1000 Mg Tabs (Metformin hcl) .Marland Kitchen.. 1 tab by mouth twice daily for diabetes. label in spanish please 4)  Glucotrol 10 Mg Tabs (Glipizide) .... One tab by mouth two times a day for diabetes. please put label and instructions in spanish 5)  Lancets Misc (Lancets) .... Sig: use with glucometer in home once daily.  disp 1 box 6)  Pravastatin Sodium 40 Mg Tabs (Pravastatin sodium) .Marland Kitchen.. 1 tab by mouth qhs for cholesterol. label in spanish please 7)  Viagra 50 Mg Tabs (Sildenafil citrate) .Marland Kitchen.. 1 tab by mouth when needed to achieve erection label in spanish please 8)  Aspirin 81 Mg Tbec (Aspirin) .Marland Kitchen.. 1 by mouth once daily  Other Orders: CBC w/Diff-[facility] (41324)  Patient Instructions: 1)  Fue un placer verle hoy.  2)  Estamos chequeandole el colesterol, el control de la glucosa, y la funcion renal hoy.  Le hago contacto con los [employer] reciba.  3)  Mande' las recetas renovadas a la farmacia de [place].  Recomiendo que vuelva a tomar una aspirina de 81mg  una vez por dia tambien.  4)  Estamos dandole una lista de dentistas que trabajan con Medicaid. 5)  LIST OF DENTISTS WHO ACCEPT MEDICAID Prescriptions: ASPIRIN 81 MG TBEC (ASPIRIN) 1 by mouth once daily  #100 x 3   Entered and Authorized by:   Paula Compton MD   Signed by:   Paula Compton MD on 07/26/2010   Method used:   Electronically to        [facility] [phone]* (retail)       [address]       [place], [place]  [address]       Ph: [phone]       Fax: [phone]   RxID:    [id number] VIAGRA 50 MG TABS (SILDENAFIL CITRATE) 1 tab by mouth when needed to achieve erection label in spanish please  #9 x 0   Entered and Authorized by:   Paula Compton MD   Signed by:   Paula Compton MD on 07/26/2010   Method used:   Electronically to        [facility] [phone]* (retail)       [address]       [place], [place]  [address]       Ph: [phone]       Fax: [phone]   RxID:   [id number] PRAVASTATIN SODIUM 40 MG TABS (PRAVASTATIN SODIUM) 1 tab by mouth qHS for cholesterol. label in Spanish please  #30 x 11   Entered and Authorized by:   Paula Compton MD   Signed by:   Paula Compton MD on 07/26/2010   Method used:   Electronically to        [facility] [phone]* (retail)       [address]       [place], [place]  [address]       Ph: [phone]       Fax: [phone]   RxID:   [id number] GLUCOTROL 10 MG TABS (GLIPIZIDE) one tab by mouth two times a day for DIABETES. please put label and instructions in SPANISH  #60 x 3   Entered and Authorized by:   Paula Compton MD   Signed by:   Paula Compton MD on 07/26/2010   Method used:   Electronically to        [facility] [phone]* (retail)       [address]       [place], [place]  [address]       Ph: [phone]       Fax: [phone]   RxID:   [id number] METFORMIN HCL 1000 MG TABS (METFORMIN HCL) 1 tab by mouth twice daily for diabetes. label in Spanish please  #60 x 11   Entered and Authorized by:   Paula Compton MD   Signed by:   Paula Compton MD on 07/26/2010   Method used:   Electronically to        [facility] [phone]* (retail)       [address]       [place], [place]  [address]       Ph: [phone]       Fax: [phone]   RxID:   [id number] BENAZEPRIL HCL 10 MG TABS (BENAZEPRIL HCL) one tablet by mouth once daily  #30 x 11   Entered and Authorized by:   Paula Compton MD   Signed by:   Paula Compton MD on 07/26/2010   Method used:   Electronically to         [facility] [phone]* (retail)       [address]       [place], [place]  [address]       Ph: [phone]       Fax: [phone]   RxID:   [id number]   Appended Document: A1c results  Laboratory Results   Blood Tests   Date/Time Received: July 26, 2010 4:05 PM  Date/Time Reported: July 26, 2010 4:26 PM   HGBA1C: 8.5%   (Normal Range: Non-Diabetic - 3-6%   Control Diabetic - 6-8%)  Comments: ...........test performed by...........Marland KitchenTerese Door, CMA

## 2011-01-09 NOTE — Progress Notes (Signed)
       Additional Follow-up for Phone Call Additional follow up Details #2::    I had interpretor call him to arrange a f/u visit. we had rec'd a fax from [facility] stating he went there for a sore throat & URI. he told interpretor he did not go there & did not want an appt.  also wants something for his HA. states the doctor was to have ordered something. none seen from last OV. to pcp.Golden Circle RN  August 21, 2010 11:40 AM  Follow-up by: Golden Circle RN,  August 21, 2010 11:39 AM  Additional Follow-up for Phone Call Additional follow up Details #3:: Details for Additional Follow-up Action Taken: I asked the interpretor to call him this am. she told me she had to leave a message for him to call back Additional Follow-up by: Golden Circle RN,  August 22, 2010 3:38 PM   May take acetaminophen 650mg  by mouth every 4 to 6 hours.  I do not see documentation for discussion of HA.  May make appt for assessment if not resolved. Paula Compton MD  August 21, 2010 7:37 PM

## 2011-01-09 NOTE — Letter (Signed)
Summary: [facility]  [facility]   Imported By: Sherian Rein 12/20/2009 11:15:33  _____________________________________________________________________  External Attachment:    Type:   Image     Comment:   External Document

## 2011-01-09 NOTE — Letter (Signed)
[facility]   Imported By: Lennie Odor 04/25/2010 17:06:31  _____________________________________________________________________  External Attachment:    Type:   Image     Comment:   External Document

## 2011-01-09 NOTE — Consult Note (Signed)
Summary: [facility]   Imported By: De Nurse 04/26/2010 15:58:51  _____________________________________________________________________  External Attachment:    Type:   Image     Comment:   External Document

## 2011-01-09 NOTE — Letter (Signed)
Summary: Generic Letter  [facility]  [address]   [place], [place] [address]   Phone: [phone]  Fax: [phone]    07/29/2010  [facility] [address] [place], [place]  [address]  Estimado Sr. Zertuche,   Fue un placer verle en el consultorio la semana pasada.  Acabo de [employer] examenes de [place]; el colesterol malo ("LDL") esta' alto en 147.  La meta en su caso clinico es de bajar este numero a menos de 100.  Si estaba sin la medicina para el colesterol por bastante tiempo, recomiendo que la vuelva a tomar.  Podemos volver a Location manager en dos meses para ver si esta' bien controlado.     Sinceramente,   Paula Compton MD  Appended Document: Generic Letter mailed

## 2011-01-09 NOTE — Consult Note (Signed)
[facility]   Imported By: De Nurse 10/25/2010 17:03:55  _____________________________________________________________________  External Attachment:    Type:   Image     Comment:   External Document

## 2011-01-09 NOTE — Consult Note (Signed)
Summary: [facility]   Imported By: De Nurse 12/23/2010 14:08:58  _____________________________________________________________________  External Attachment:    Type:   Image     Comment:   External Document

## 2011-01-29 ENCOUNTER — Encounter (HOSPITAL_BASED_OUTPATIENT_CLINIC_OR_DEPARTMENT_OTHER): Payer: Medicaid Other | Admitting: Internal Medicine

## 2011-01-29 DIAGNOSIS — C8299 Follicular lymphoma, unspecified, extranodal and solid organ sites: Secondary | ICD-10-CM

## 2011-01-29 DIAGNOSIS — Z5112 Encounter for antineoplastic immunotherapy: Secondary | ICD-10-CM

## 2011-01-29 NOTE — Letter (Signed)
Summary: [facility]  [facility]   Imported By: Sherian Rein 01/23/2011 11:55:24  _____________________________________________________________________  External Attachment:    Type:   Image     Comment:   External Document

## 2011-03-14 LAB — DIFFERENTIAL
Basophils Absolute: 0 10*3/uL (ref 0.0–0.1)
Basophils Relative: 0 % (ref 0–1)
Lymphocytes Relative: 24 % (ref 12–46)
Monocytes Absolute: 1 10*3/uL (ref 0.1–1.0)
Monocytes Relative: 16 % — ABNORMAL HIGH (ref 3–12)
Neutro Abs: 3.5 10*3/uL (ref 1.7–7.7)
Neutrophils Relative %: 54 % (ref 43–77)

## 2011-03-14 LAB — CLOSTRIDIUM DIFFICILE EIA

## 2011-03-14 LAB — POCT URINALYSIS DIP (DEVICE)
Bilirubin Urine: NEGATIVE
Protein, ur: NEGATIVE mg/dL
Specific Gravity, Urine: >=1.03 (ref 1.005–1.030)
pH: 5.5 (ref 5.0–8.0)

## 2011-03-14 LAB — POCT I-STAT, CHEM 8
BUN: 13 mg/dL (ref 6–23)
Chloride: 105 mEq/L (ref 96–112)
Creatinine, Ser: 0.6 mg/dL (ref 0.4–1.5)
Sodium: 137 mEq/L (ref 135–145)
TCO2: 24 mmol/L (ref 0–100)

## 2011-03-14 LAB — CBC
Hemoglobin: 13.8 g/dL (ref 13.0–17.0)
MCHC: 34.9 g/dL (ref 30.0–36.0)
RBC: 4.52 MIL/uL (ref 4.22–5.81)
WBC: 6.4 10*3/uL (ref 4.0–10.5)

## 2011-03-14 LAB — STOOL CULTURE

## 2011-03-20 LAB — GLUCOSE, CAPILLARY: Glucose-Capillary: 155 mg/dL — ABNORMAL HIGH (ref 70–99)

## 2011-03-24 LAB — CBC
HCT: 44.9 % (ref 39.0–52.0)
Hemoglobin: 15.2 g/dL (ref 13.0–17.0)
MCV: 89.4 fL (ref 78.0–100.0)
RBC: 5.02 MIL/uL (ref 4.22–5.81)
WBC: 8.7 10*3/uL (ref 4.0–10.5)

## 2011-03-24 LAB — GLUCOSE, CAPILLARY

## 2011-03-25 LAB — CBC
Hemoglobin: 14.7 g/dL (ref 13.0–17.0)
MCHC: 33.7 g/dL (ref 30.0–36.0)
RBC: 4.87 MIL/uL (ref 4.22–5.81)
WBC: 7.8 10*3/uL (ref 4.0–10.5)

## 2011-03-25 LAB — CHROMOSOME ANALYSIS, BONE MARROW

## 2011-03-25 LAB — DIFFERENTIAL
Basophils Relative: 1 % (ref 0–1)
Lymphocytes Relative: 29 % (ref 12–46)
Lymphs Abs: 2.3 10*3/uL (ref 0.7–4.0)
Monocytes Relative: 10 % (ref 3–12)
Neutro Abs: 4.5 10*3/uL (ref 1.7–7.7)
Neutrophils Relative %: 57 % (ref 43–77)

## 2011-03-25 LAB — GLUCOSE, CAPILLARY
Glucose-Capillary: 136 mg/dL — ABNORMAL HIGH (ref 70–99)
Glucose-Capillary: 141 mg/dL — ABNORMAL HIGH (ref 70–99)

## 2011-03-31 ENCOUNTER — Encounter (HOSPITAL_BASED_OUTPATIENT_CLINIC_OR_DEPARTMENT_OTHER): Payer: Medicaid Other | Admitting: Internal Medicine

## 2011-03-31 ENCOUNTER — Other Ambulatory Visit: Payer: Self-pay | Admitting: Internal Medicine

## 2011-03-31 DIAGNOSIS — C8589 Other specified types of non-Hodgkin lymphoma, extranodal and solid organ sites: Secondary | ICD-10-CM

## 2011-03-31 DIAGNOSIS — Z5112 Encounter for antineoplastic immunotherapy: Secondary | ICD-10-CM

## 2011-03-31 LAB — COMPREHENSIVE METABOLIC PANEL
ALT: 18 U/L (ref 0–53)
CO2: 21 mEq/L (ref 19–32)
Calcium: 10.2 mg/dL (ref 8.4–10.5)
Chloride: 97 mEq/L (ref 96–112)
Sodium: 133 mEq/L — ABNORMAL LOW (ref 135–145)
Total Protein: 7 g/dL (ref 6.0–8.3)

## 2011-03-31 LAB — LACTATE DEHYDROGENASE: LDH: 191 U/L (ref 94–250)

## 2011-03-31 LAB — CBC WITH DIFFERENTIAL/PLATELET
BASO%: 0.1 % (ref 0.0–2.0)
Eosinophils Absolute: 0.6 10*3/uL — ABNORMAL HIGH (ref 0.0–0.5)
HCT: 48.4 % (ref 38.4–49.9)
MCHC: 34.5 g/dL (ref 32.0–36.0)
MONO#: 0.8 10*3/uL (ref 0.1–0.9)
NEUT#: 5.1 10*3/uL (ref 1.5–6.5)
NEUT%: 62.6 % (ref 39.0–75.0)
RBC: 5.6 10*6/uL (ref 4.20–5.82)
WBC: 8.1 10*3/uL (ref 4.0–10.3)
lymph#: 1.7 10*3/uL (ref 0.9–3.3)

## 2011-04-22 NOTE — H&P (Signed)
NAMEENRIQUE, MANGANARO             ACCOUNT NO.:  [id number]   MEDICAL RECORD NO.:  [id number]          PATIENT TYPE:  INP   LOCATION:  5015                         FACILITY:  [facility]   PHYSICIAN:  Michaelyn Barter, M.D. DATE OF BIRTH:  Jun 15, 1961   DATE OF ADMISSION:  01/12/2008  DATE OF DISCHARGE:                              HISTORY & PHYSICAL   PRIMARY CARE PHYSICIAN:  The patient's primary care doctor is unknown.   CHIEF COMPLAINT:  Dizziness.   HISTORY OF THE PRESENT ILLNESS:  The patient is a 50 year old gentleman  with a past medical history of diabetes mellitus.  He is a [place]-  speaking male and the hospital interpreter was used to gather the  history.   The patient indicates that he has been feeling ill since this morning.  He has numerous complaints, which are somewhat vaguely described.  He  states that he has been feeling as if his blood pressure has been either  up or down, and he began to develop cramps in his fingers.  He also  states that he has been under a lot of stress secondary to not having a  job recently; and, he has been feeling dizzy, and, in particular, today  he felt as if he was going to lose his balance as he attempted to walk.  He denies having any vomiting; however, he does complain of feeling  sick to his stomach.  He denies the presence of fevers or chills.  No  shortness of breath.  No dysuria.  No changes in his bowel habits.   PAST MEDICAL HISTORY:  Diabetes mellitus diagnosed two years ago.   PAST SURGICAL HISTORY:  None.   ALLERGIES:  None.   MEDICATIONS:  Home medications;  the patient indicates that he was  taking an oral medication for his diabetes, but he stopped it four  months ago.   FAMILY HISTORY:  Mother has diabetes mellitus.  Father has diabetes  mellitus.   SOCIAL HISTORY:  The social history is positive for cigarettes; the  patient smokes three to four cigarettes per day.  Alcohol; occasional  beer.   REVIEW OF  SYSTEMS:  The review of systems is as per the HPI.   PHYSICAL EXAMINATION:  GENERAL APPEARANCE:  The patient is awake.  He is  in no obvious distress.  He is cooperative.  VITAL SIGNS:  The patient's temperature is  97.9, blood pressure 128/83,  heart rate 94, respirations 16, and O2 sat is 100%.  HEENT:  Normocephalic and atraumatic.  Anicteric.  Extraocular movements  are intact.  Oral mucosa is pink with no thrush and no exudates.  NECK:  The neck is supple.  No lymphadenopathy.  No thyromegaly.  HEART:  Cardiac shows S1 and S2 are present.  Regular rate and rhythm.  No murmurs, no gallops and no rubs.  CHEST AND LUNGS:  Respiratory with no crackles or wheezes.  ABDOMEN:  The abdomen is flat and soft.  Nontender and nondistended.  Positive bowel sounds times four quadrants.  EXTREMITIES:  The extremities show no leg edema.  NEUROLOGIC EXAMINATION:  Neurologically there is  no facial droop.  Grip  strength is equal bilaterally.  No focal deficits.   LABORATORY DATA:  The EKG reveals normal sinus rhythm with no Q waves or  ST segment abnormalities.  Urinalysis is negative.  ABGs show pH 7.359,  pCO2 42.4 and bicarbonate 23.9.  Hemoglobin 16.7 and hematocrit 49.0.  Sodium 131, potassium 4.2, chloride 101, glucose 322, BUN 7, and  creatinine 0.7.   ASSESSMENT AND PLAN:  The patient is a 50 year old gentleman with a past  medical history of diabetes mellitus whose sugars are currently elevated  secondary to noncompliance with his diabetic medications.  He currently  presents with numerous, somewhat vaguely described, complaints and the  overall complaint of not feeling well, the underlying etiology of which  is questionable.   1. Dizziness/decreased balance.  The etiology of this is questionable.      On physical examination there are no obvious focal deficits.  The      patient's strength appears to be intact.  Whether or not      dehydration contributed to this versus the long-term  affects of      being poorly compliant with his diabetic medications is      questionable.  We will hydrate the patient for now.  We may      consider a computerized tomographic scan of the patient's head      although at this particular time I cannot identify any obvious      focal deficits nor is there a good history to support the need for      a computerized axial tomographic scan of the patient's head at this      time.  2. History of DM.  The patient has not been compliant with his      medications.  He indicates that he stopped taking his oral diabetic      medications secondary to a burning sensation in both his feet two      months ago.  This sounds as if it may have been diabetic-related      neuropathy.  We will check the patient's hemoglobin A-1-C, start      Accu-Chek as well as check a fasting lipid profile and may consider      restarting the patient on an oral diabetic medication.  3. Gastrointestinal prophylaxis.  We will provide Protonix.  4. For deep venous thrombosis we will provide Lovenox.      Michaelyn Barter, M.D.  Electronically Signed     OR/MEDQ  D:  01/13/2008  T:  01/13/2008  Job:  [id number]

## 2011-04-22 NOTE — Discharge Summary (Signed)
Blake Vaughn, BIRMAN             ACCOUNT NO.:  [id number]   MEDICAL RECORD NO.:  [id number]          PATIENT TYPE:  INP   LOCATION:  5015                         FACILITY:  [facility]   PHYSICIAN:  Mobolaji B. Bakare, M.D.DATE OF BIRTH:  02-06-1961   DATE OF ADMISSION:  01/13/2008  DATE OF DISCHARGE:  01/14/2008                               DISCHARGE SUMMARY   PRIMARY CARE PHYSICIAN:  Unassigned.   FINAL DIAGNOSES:  1. Uncontrolled type 2 diabetes mellitus secondary to noncompliance.  2. Dyslipidemia.  3. Mild thrombocytopenia.  4. Obesity with body mass index of 30.1.   PROCEDURE:  None.   CONSULTANTS:  None.   HOSPITAL COURSE:  [name] is a 50 year old Spanish-speaking  gentleman who presented to the emergency room with dizziness and  numbness in his lower extremity.  He was noted to have blood glucose of  over 300 in the emergency room.  The patient stated that he has quit  using his diabetic medication.  He does not have a regular doctor.  He  cannot recall the name of the medication.  He was admitted for further  evaluation and treatment.  His blood pressure has been reasonably controlled during the course of  hospitalization.  It ranged between 111-136 systolic and diastolic of 73-  86.   He was started on metformin 500 mg b.i.d.  His blood glucose trended  downwards with a fasting of 215 at the time of discharge.  The patient  has an appointment to follow up with a primary care physician today.  Hence, he is being discharged to continue outpatient monitoring and  adjustment of his medication.  Hemoglobin A1c is pending at the time of  discharge.  He has been advised on regular exercise and dietary advice.  He will need to follow up again with outpatient diabetic education  preferably with a Spanish-speaking educator.   He had a fasting lipid profile which showed total cholesterol of 228,  HDL 30, LDL of 137, VLDL 61, LFTs were not available prior to discharge.  Hence, a statin will be recommended after his primary care physician as  checked his LFTs prior to initiating statin.  This has been written in  the discharge instructions for the patient to show to his primary care  physician.   Mild thrombocytopenia.  The patient was noted on CBC to have platelets  of 136.  He has no active bleeding.  This needs to be followed up in 2  weeks with a repeat CBC.   DISCHARGE MEDICATIONS:  1. Metformin 500 mg p.o. b.i.d.  2. Baby aspirin 81 mg daily.  3. Glucose monitoring kit to check his blood glucose daily and show      records to his PCP.   RECOMMENDATIONS:  1. Check LFTs by his primary care physician and consider starting      statin.  2. Refer to outpatient diabetic education.  3. Other diabetic evaluations as deemed necessary including      examination.  Urine micro albumin anemia.  4. Recheck CBC in 2 weeks.   DISCHARGE LABORATORY DATA:  Sodium 135, potassium 3.6,  chloride 105,  bicarb 25, BUN 9, creatinine 0.64, calcium 9.0, white cell 9.9,  hemoglobin 14.7, hematocrit 52.3, platelets 126.      Mobolaji B. Corky Downs, M.D.  Electronically Signed     MBB/MEDQ  D:  01/14/2008  T:  01/15/2008  Job:  [id number]

## 2011-06-02 ENCOUNTER — Other Ambulatory Visit: Payer: Self-pay | Admitting: Internal Medicine

## 2011-06-02 ENCOUNTER — Encounter (HOSPITAL_BASED_OUTPATIENT_CLINIC_OR_DEPARTMENT_OTHER): Payer: Medicaid Other | Admitting: Internal Medicine

## 2011-06-02 DIAGNOSIS — Z5112 Encounter for antineoplastic immunotherapy: Secondary | ICD-10-CM

## 2011-06-02 DIAGNOSIS — Z5111 Encounter for antineoplastic chemotherapy: Secondary | ICD-10-CM

## 2011-06-02 DIAGNOSIS — C8299 Follicular lymphoma, unspecified, extranodal and solid organ sites: Secondary | ICD-10-CM

## 2011-06-02 DIAGNOSIS — C8590 Non-Hodgkin lymphoma, unspecified, unspecified site: Secondary | ICD-10-CM

## 2011-06-02 LAB — COMPREHENSIVE METABOLIC PANEL
ALT: 16 U/L (ref 0–53)
CO2: 20 mEq/L (ref 19–32)
Calcium: 9.5 mg/dL (ref 8.4–10.5)
Chloride: 101 mEq/L (ref 96–112)
Creatinine, Ser: 0.61 mg/dL (ref 0.50–1.35)
Glucose, Bld: 257 mg/dL — ABNORMAL HIGH (ref 70–99)
Total Bilirubin: 0.5 mg/dL (ref 0.3–1.2)

## 2011-06-02 LAB — CBC WITH DIFFERENTIAL/PLATELET
BASO%: 0.1 % (ref 0.0–2.0)
EOS%: 6.4 % (ref 0.0–7.0)
MCH: 30.2 pg (ref 27.2–33.4)
MCHC: 35.5 g/dL (ref 32.0–36.0)
RBC: 5.39 10*6/uL (ref 4.20–5.82)
RDW: 12.4 % (ref 11.0–14.6)
lymph#: 1.9 10*3/uL (ref 0.9–3.3)

## 2011-06-02 LAB — LACTATE DEHYDROGENASE: LDH: 184 U/L (ref 94–250)

## 2011-07-24 ENCOUNTER — Other Ambulatory Visit: Payer: Self-pay | Admitting: Internal Medicine

## 2011-07-24 ENCOUNTER — Ambulatory Visit (HOSPITAL_COMMUNITY)
Admission: RE | Admit: 2011-07-24 | Discharge: 2011-07-24 | Disposition: A | Discharge status: Home / Self Care | Payer: Medicaid Other | Source: Ambulatory Visit | Attending: Internal Medicine | Admitting: Internal Medicine

## 2011-07-24 ENCOUNTER — Encounter (HOSPITAL_BASED_OUTPATIENT_CLINIC_OR_DEPARTMENT_OTHER): Payer: Medicaid Other | Admitting: Internal Medicine

## 2011-07-24 DIAGNOSIS — C8589 Other specified types of non-Hodgkin lymphoma, extranodal and solid organ sites: Secondary | ICD-10-CM | POA: Insufficient documentation

## 2011-07-24 DIAGNOSIS — C8299 Follicular lymphoma, unspecified, extranodal and solid organ sites: Secondary | ICD-10-CM

## 2011-07-24 DIAGNOSIS — K7689 Other specified diseases of liver: Secondary | ICD-10-CM | POA: Insufficient documentation

## 2011-07-24 DIAGNOSIS — Z9221 Personal history of antineoplastic chemotherapy: Secondary | ICD-10-CM | POA: Insufficient documentation

## 2011-07-24 DIAGNOSIS — J984 Other disorders of lung: Secondary | ICD-10-CM | POA: Insufficient documentation

## 2011-07-24 DIAGNOSIS — C8590 Non-Hodgkin lymphoma, unspecified, unspecified site: Secondary | ICD-10-CM

## 2011-07-24 LAB — CBC WITH DIFFERENTIAL/PLATELET
BASO%: 0.1 % (ref 0.0–2.0)
EOS%: 2.8 % (ref 0.0–7.0)
MCH: 30.8 pg (ref 27.2–33.4)
MCHC: 34.9 g/dL (ref 32.0–36.0)
MCV: 88.2 fL (ref 79.3–98.0)
MONO%: 7.7 % (ref 0.0–14.0)
NEUT#: 6.3 10*3/uL (ref 1.5–6.5)
RBC: 5.18 10*6/uL (ref 4.20–5.82)
RDW: 12.4 % (ref 11.0–14.6)

## 2011-07-24 LAB — CMP (CANCER CENTER ONLY)
AST: 18 U/L (ref 11–38)
Albumin: 3.6 g/dL (ref 3.3–5.5)
Alkaline Phosphatase: 78 U/L (ref 26–84)
Potassium: 4.9 mEq/L — ABNORMAL HIGH (ref 3.3–4.7)
Sodium: 137 mEq/L (ref 128–145)
Total Bilirubin: 0.9 mg/dl (ref 0.20–1.60)
Total Protein: 6.8 g/dL (ref 6.4–8.1)

## 2011-07-24 IMAGING — CT CT CHEST W/ CM
2 of 5 series · 17 of 46 positions shown, 19 images · IV contrast (agent unspecified)
Comparison: [DATE]

CT CHEST

CLINICAL DATA: Follow-up non-Hodgkins lymphoma.  Ongoing
chemotherapy.

CT CHEST, ABDOMEN AND PELVIS WITH CONTRAST
TECHNIQUE: Multidetector CT imaging of the chest, abdomen and
pelvis was performed following the standard protocol during bolus
administration of intravenous contrast.
Contrast: 100 ml [HP] and oral contrast.  The patient was
premedicated with 50 mg Benadryl.

[Series 2: cap with st · axial · 0.84mm/px · z∈[-590,-60]mm · 14 of 122 slices shown, 16 images]
[im 8/122  soft-tissue]
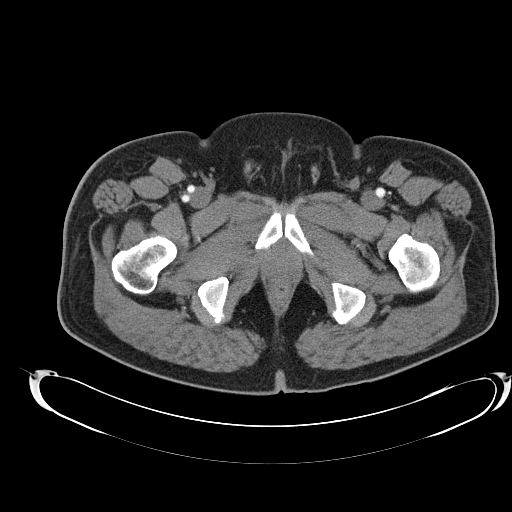
[im 8/122  bone]
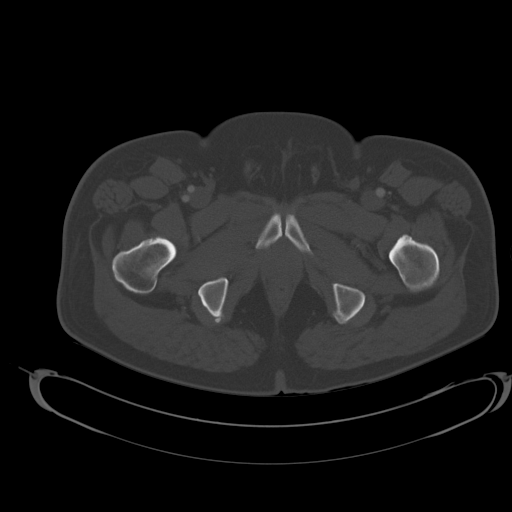
[im 15/122  soft-tissue]
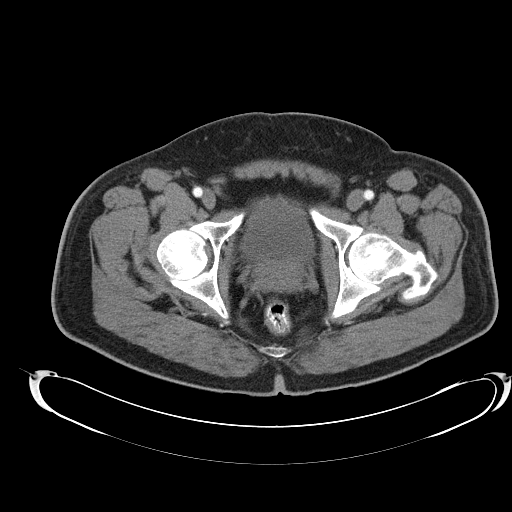
[im 22/122  soft-tissue]
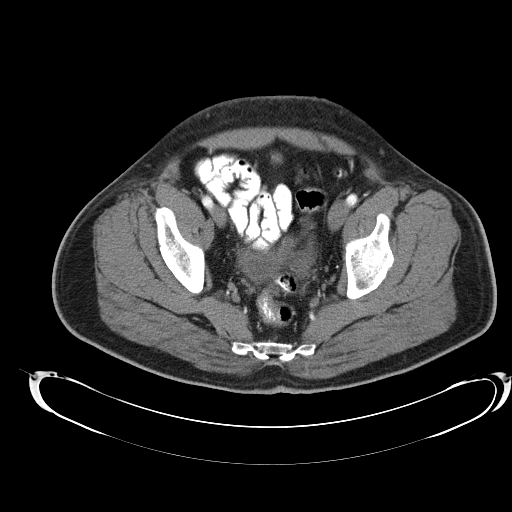
[im 36/122  soft-tissue]
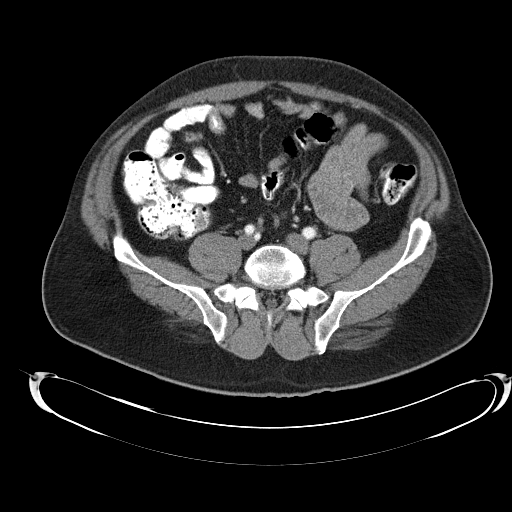
[im 43/122  soft-tissue]
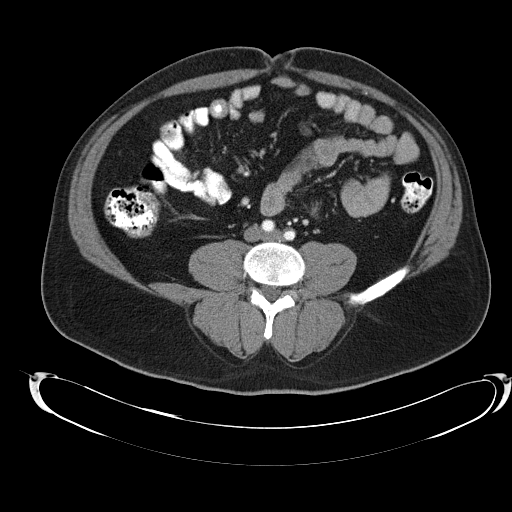
[im 50/122  soft-tissue]
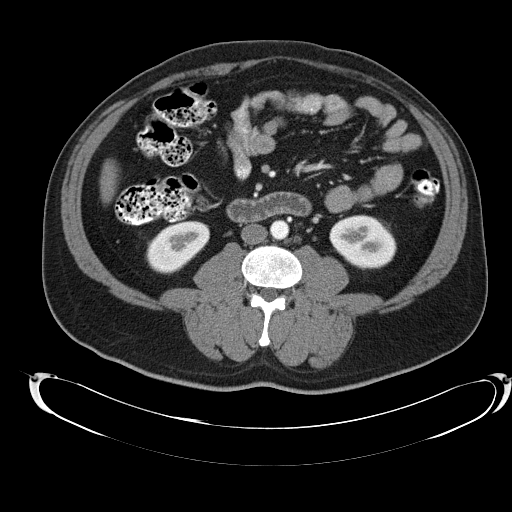
[im 57/122  soft-tissue]
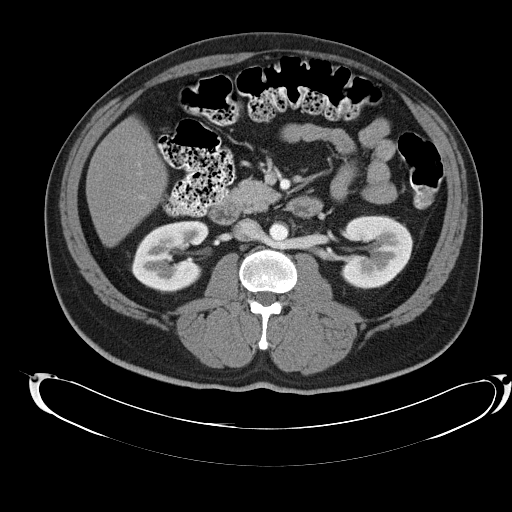
[im 65/122  soft-tissue]
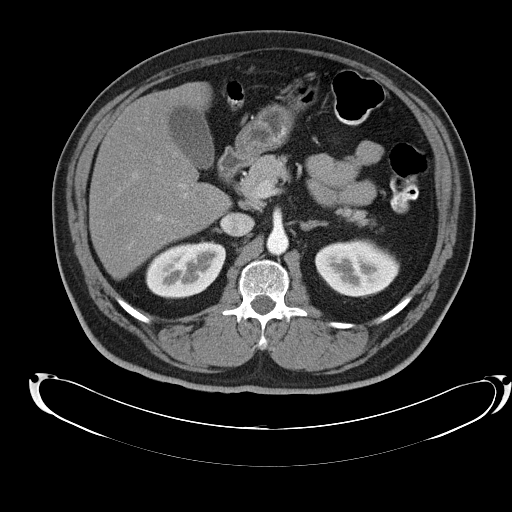
[im 72/122  soft-tissue]
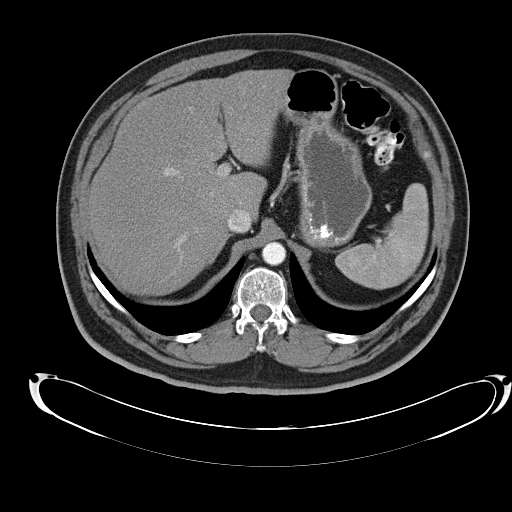
[im 72/122  bone]
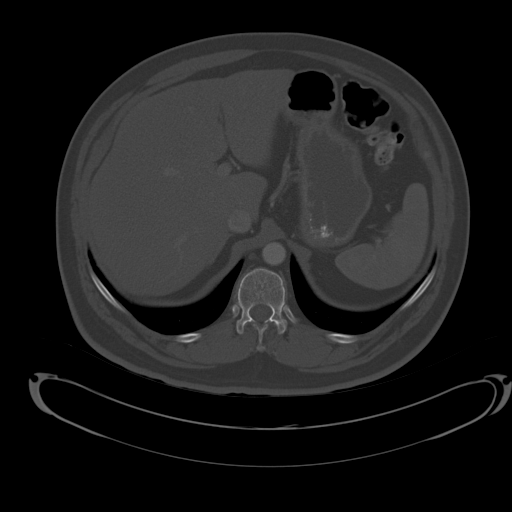
[im 79/122  soft-tissue]
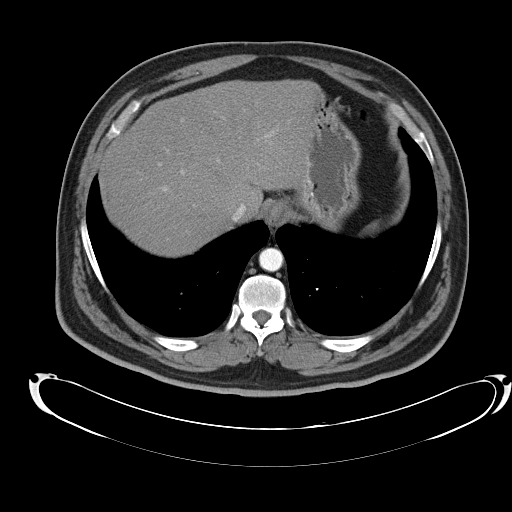
[im 93/122  soft-tissue]
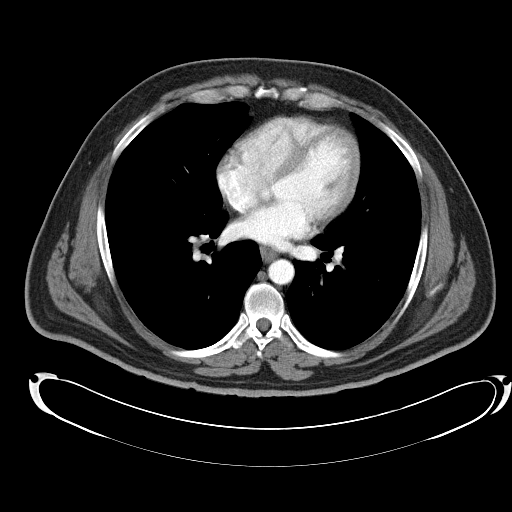
[im 100/122  soft-tissue]
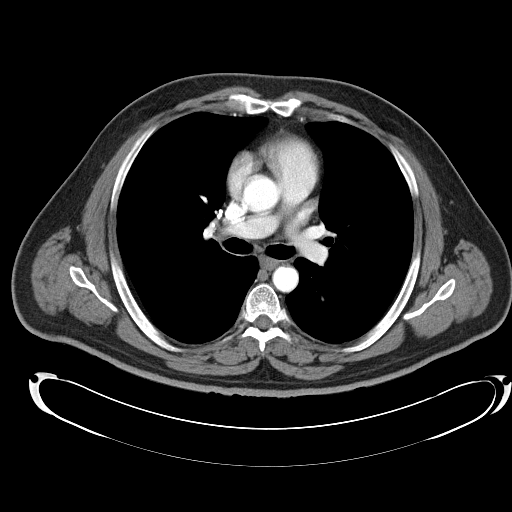
[im 107/122  soft-tissue]
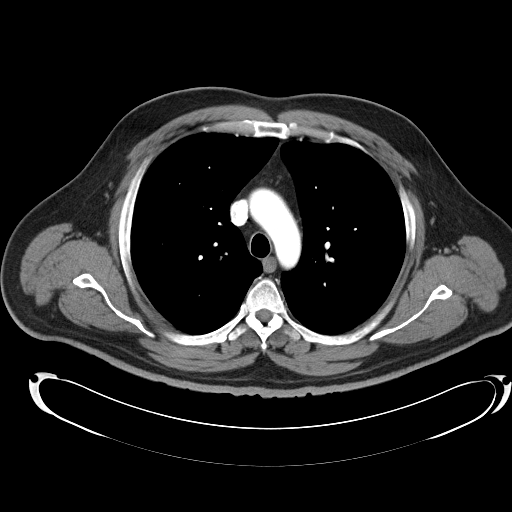
[im 114/122  soft-tissue]
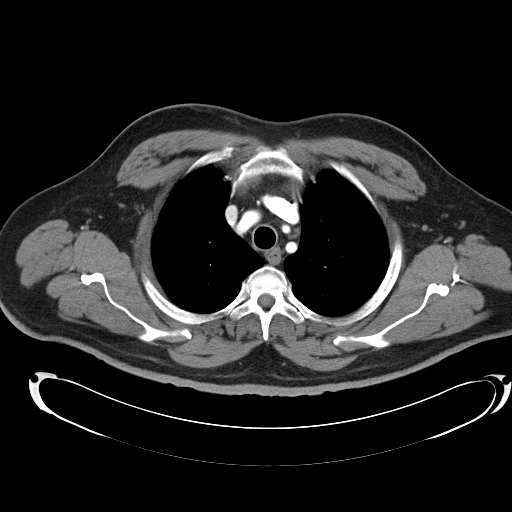

[Series 602: <mpr thick range> · coronal · 1.19mm/px · 3 of 100 slices shown]
[im 34/100  soft-tissue]
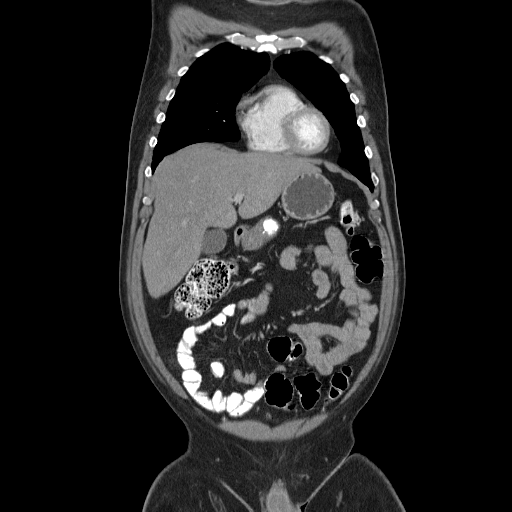
[im 45/100  soft-tissue]
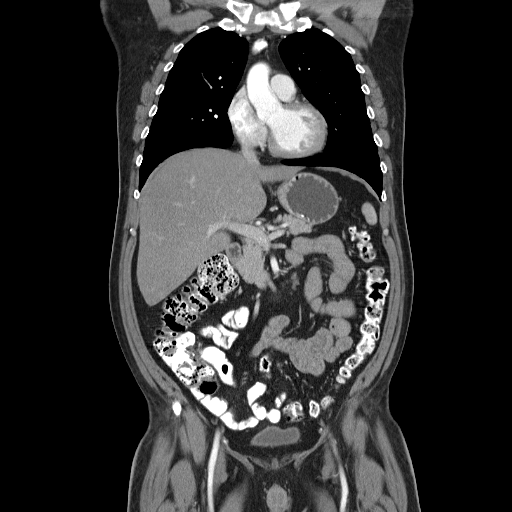
[im 56/100  soft-tissue]
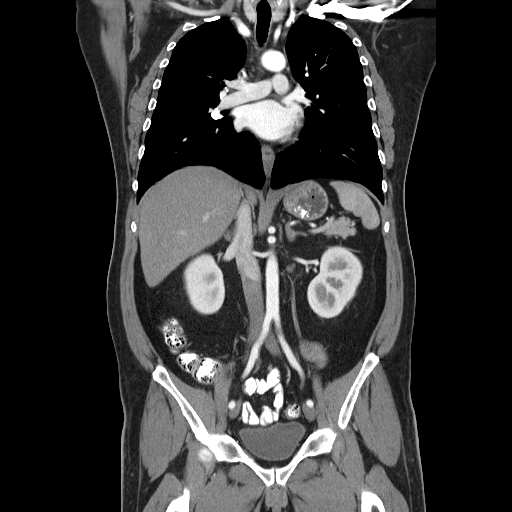

[17 of 46 positions shown; findings below may reference images not displayed]

FINDINGS: No evidence of mediastinal or hilar masses.  No evidence
of lymphadenopathy elsewhere within the thorax.  No evidence of
pleural or pericardial effusion.  A calcified granuloma is again
seen in the right middle lobe, and right hilar lymph node
calcifications are consistent with old granulomatous disease.  No
suspicious pulmonary nodules or masses are identified.  No evidence
of pulmonary infiltrate, and no endobronchial lesion identified. No
suspicious bone lesions are identified.
IMPRESSION: Negative.  No evidence of recurrent lymphoma or other active
disease within the thorax.

CT ABDOMEN AND PELVIS
FINDINGS: The spleen is normal in size and appearance.  Mild
hepatic steatosis is noted, however the abdominal parenchymal
organs are otherwise unremarkable.  No soft tissue masses or
lymphadenopathy are identified.  No evidence of inflammatory
process or abnormal fluid collections within the abdomen or pelvis.
No evidence of dilated bowel loops or bowel wall thickening.  No
suspicious bone lesions are identified.
IMPRESSION: 1.  No evidence of recurrent lymphoma or other acute findings.
2.  Mild hepatic steatosis.

## 2011-07-24 MED ORDER — IOHEXOL 300 MG/ML  SOLN
100.0000 mL | Freq: Once | INTRAMUSCULAR | Status: AC | PRN
  Administered 2011-07-24: 100 mL via INTRAVENOUS

## 2011-07-29 ENCOUNTER — Encounter (HOSPITAL_BASED_OUTPATIENT_CLINIC_OR_DEPARTMENT_OTHER): Payer: Medicaid Other | Admitting: Internal Medicine

## 2011-07-29 DIAGNOSIS — C8299 Follicular lymphoma, unspecified, extranodal and solid organ sites: Secondary | ICD-10-CM

## 2011-07-29 DIAGNOSIS — Z5112 Encounter for antineoplastic immunotherapy: Secondary | ICD-10-CM

## 2011-08-29 LAB — BASIC METABOLIC PANEL
BUN: 9
Calcium: 9
Creatinine, Ser: 0.64
GFR calc non Af Amer: >60
Potassium: 3.8

## 2011-08-29 LAB — POCT CARDIAC MARKERS
CKMB, poc: 2.1
Myoglobin, poc: 55.4
Operator id: 234501

## 2011-08-29 LAB — URINALYSIS, ROUTINE W REFLEX MICROSCOPIC
Glucose, UA: >1000 — AB
Ketones, ur: NEGATIVE
Leukocytes, UA: NEGATIVE
Nitrite: NEGATIVE
Specific Gravity, Urine: 1.029
pH: 6.5

## 2011-08-29 LAB — LIPID PANEL
Cholesterol: 228 — ABNORMAL HIGH
LDL Cholesterol: 137 — ABNORMAL HIGH

## 2011-08-29 LAB — HEMOGLOBIN A1C
Hgb A1c MFr Bld: 11.2 — ABNORMAL HIGH
Mean Plasma Glucose: 321

## 2011-08-29 LAB — CBC
Platelets: 136 — ABNORMAL LOW
WBC: 9.9

## 2011-08-29 LAB — I-STAT 8, (EC8 V) (CONVERTED LAB)
BUN: 7
Chloride: 101
Glucose, Bld: 322 — ABNORMAL HIGH
Potassium: 4.2
pCO2, Ven: 42.4 — ABNORMAL LOW
pH, Ven: 7.359 — ABNORMAL HIGH

## 2011-08-29 LAB — TROPONIN I: Troponin I: 0.01

## 2011-08-29 LAB — URINE MICROSCOPIC-ADD ON

## 2011-08-29 LAB — CK TOTAL AND CKMB (NOT AT ARMC)
CK, MB: 2.5
Relative Index: INVALID

## 2011-08-29 LAB — POCT I-STAT CREATININE: Creatinine, Ser: 0.7

## 2011-09-11 LAB — GLUCOSE, CAPILLARY
Glucose-Capillary: 60 mg/dL — ABNORMAL LOW (ref 70–99)
Glucose-Capillary: 85 mg/dL (ref 70–99)

## 2011-09-29 ENCOUNTER — Encounter (HOSPITAL_BASED_OUTPATIENT_CLINIC_OR_DEPARTMENT_OTHER): Payer: Medicaid Other | Admitting: Internal Medicine

## 2011-09-29 ENCOUNTER — Other Ambulatory Visit: Payer: Self-pay | Admitting: Internal Medicine

## 2011-09-29 DIAGNOSIS — C8589 Other specified types of non-Hodgkin lymphoma, extranodal and solid organ sites: Secondary | ICD-10-CM

## 2011-09-29 DIAGNOSIS — C8299 Follicular lymphoma, unspecified, extranodal and solid organ sites: Secondary | ICD-10-CM

## 2011-09-29 DIAGNOSIS — Z5112 Encounter for antineoplastic immunotherapy: Secondary | ICD-10-CM

## 2011-09-29 DIAGNOSIS — Z5111 Encounter for antineoplastic chemotherapy: Secondary | ICD-10-CM

## 2011-09-29 LAB — COMPREHENSIVE METABOLIC PANEL
AST: 18 U/L (ref 0–37)
Albumin: 4.2 g/dL (ref 3.5–5.2)
BUN: 15 mg/dL (ref 6–23)
Calcium: 9.4 mg/dL (ref 8.4–10.5)
Chloride: 100 mEq/L (ref 96–112)
Potassium: 4.5 mEq/L (ref 3.5–5.3)
Sodium: 132 mEq/L — ABNORMAL LOW (ref 135–145)
Total Protein: 6.5 g/dL (ref 6.0–8.3)

## 2011-09-29 LAB — CBC WITH DIFFERENTIAL/PLATELET
Basophils Absolute: 0 10*3/uL (ref 0.0–0.1)
EOS%: 5.3 % (ref 0.0–7.0)
Eosinophils Absolute: 0.4 10*3/uL (ref 0.0–0.5)
HGB: 15.7 g/dL (ref 13.0–17.1)
MCH: 30.8 pg (ref 27.2–33.4)
NEUT#: 5 10*3/uL (ref 1.5–6.5)
RBC: 5.11 10*6/uL (ref 4.20–5.82)
RDW: 12.5 % (ref 11.0–14.6)
lymph#: 1.4 10*3/uL (ref 0.9–3.3)

## 2011-10-16 ENCOUNTER — Ambulatory Visit (INDEPENDENT_AMBULATORY_CARE_PROVIDER_SITE_OTHER): Payer: Medicaid Other | Admitting: Family Medicine

## 2011-10-16 DIAGNOSIS — R519 Headache, unspecified: Secondary | ICD-10-CM | POA: Insufficient documentation

## 2011-10-16 DIAGNOSIS — R51 Headache: Secondary | ICD-10-CM

## 2011-10-16 DIAGNOSIS — G8929 Other chronic pain: Secondary | ICD-10-CM | POA: Insufficient documentation

## 2011-10-16 DIAGNOSIS — N529 Male erectile dysfunction, unspecified: Secondary | ICD-10-CM

## 2011-10-16 MED ORDER — SILDENAFIL CITRATE 50 MG PO TABS: 50.0000 mg | ORAL_TABLET | ORAL | Status: DC | PRN

## 2011-10-16 MED ORDER — PRAVASTATIN SODIUM 40 MG PO TABS: 40.0000 mg | ORAL_TABLET | Freq: Every day | ORAL | Status: DC

## 2011-10-16 MED ORDER — BENAZEPRIL HCL 5 MG PO TABS: 5.0000 mg | ORAL_TABLET | Freq: Every day | ORAL | Status: DC

## 2011-10-16 MED ORDER — METFORMIN HCL 1000 MG PO TABS: 1000.0000 mg | ORAL_TABLET | Freq: Two times a day (BID) | ORAL | Status: DC

## 2011-10-16 NOTE — Assessment & Plan Note (Signed)
He is not interested in treating his diabetes.  Answered phone calls while I was speaking with him. I advised I would refill his metformin, lisinopril at half dose given low BP off treatment, and his pravastatin for one month.    I encouraged him to follow-up with PCP in 2-3 weeks for fasting labs, Cr recheck and continued DM care.

## 2011-10-16 NOTE — Assessment & Plan Note (Signed)
Gave him 3 tablets at his request.  States no side effects with taking it.  Advised must see MD for further refills.

## 2011-10-16 NOTE — Patient Instructions (Signed)
Continue to use benadryl at night if it helps you sleep and prevents headaches  If you continue to have headaches, you need to see your doctor to discuss further testing  Follow-up in 2-3 weeks to see your doctor and get fasting labwork.

## 2011-10-16 NOTE — Progress Notes (Signed)
  Subjective:    Patient ID: Blake Vaughn, male    DOB: 1961/05/31, 50 y.o.   MRN: [id number]  HPI50 yo with history of non hodgkins lymphoma and untreated DM here for evaluation of headache.  Pain for the past 8 days, no history of migraines or similar headaches.  Occipital, feels throbbing for several hours at a time.  Improved when he goes to the bathroom or splashes water on his head.  Wakes him up regularly at 3am.  Occurs approx 3 times per week.  No headaches during the day or when he wakes up in the morning.  Intensity 3/10 at peak.    Started chemo for non Hodgkins lymphoma 4 years ago, had another treatment Oct 18th, gets them every 2 months. He says his oncologist feels he will be done with treatment next year.  He told his oncologist about his headaches and was told to take benadryl, which relieves his headache and lets him sleep through the night.  Notes tingling in hands and feet for years.  Requests more Viagra, has not taken any recently, not associated with headaches.  Has not been taking any of his DM medications nor checking his blood sugar.  States he gets bored with taking medications and fingersticks hurt.  I have reviewed patient's  PMH, FH, and Social history and Medications as related to this visit.  Review of Systems Gen:  No fever, chills, unexplained weight loss Eyes: No vision changes recently- has hx of cataracts, double vision Abd: No nausea, vomting, diarrhea, constipation, or change in bowel color, size, or caliber. MSK: no joint pain, myalgias SKIN: no rash, changing moles GU: No dysuria, hematuria, vaginal discharge Neuro:  No weakness      Objective:   Physical Exam  GEN: Alert & Oriented, No acute distress HEENT: [place]/AT. EOMI, PERRLA, no conjunctival injection or scleral icterus.  Bilateral tympanic membranes intact without erythema or effusion.  .  Nares without edema or rhinorrhea.  Oropharynx is without erythema or exudates.  No anterior or  posterior cervical lymphadenopathy. CV:  Regular Rate & Rhythm, no murmur Respiratory:  Normal work of breathing, CTAB Abd:  + BS, soft, no tenderness to palpation Ext: no pre-tibial edema Neuro:  EOMI, PERRLA, fundus benign.  Strength 5/5.  No pronator drift, normal finger to nose.  Reflexes equal Bilaterally patellar      Assessment & Plan:

## 2011-10-16 NOTE — Progress Notes (Signed)
Due to language barrier, an interpreter was present during the history-taking and subsequent discussion (and for part of the physical exam) with this patient. Interpreter Wyvonnia Dusky 10/16/2011

## 2011-10-16 NOTE — Assessment & Plan Note (Signed)
Patient has new onset headache.  DM poorly controlled.    I suspect multifactorial in etiology.  No evidence of neuro deficits or mass effect.   Advised to take benadryl nightly prn as headaches are mild and seem to totally resolve with treatment.  I advised him to follow-up in 2-3 weeks- if continues to have headaches, may consider imaging given his hx of cancer and new onset headache.

## 2011-11-21 ENCOUNTER — Ambulatory Visit (INDEPENDENT_AMBULATORY_CARE_PROVIDER_SITE_OTHER): Payer: Medicaid Other | Admitting: Family Medicine

## 2011-11-21 ENCOUNTER — Other Ambulatory Visit: Payer: Self-pay | Admitting: Family Medicine

## 2011-11-21 ENCOUNTER — Encounter: Payer: Self-pay | Admitting: Family Medicine

## 2011-11-21 VITALS — BP 130/84 | HR 90 | Ht 65.0 in | Wt 189.0 lb

## 2011-11-21 DIAGNOSIS — N529 Male erectile dysfunction, unspecified: Secondary | ICD-10-CM

## 2011-11-21 LAB — LDL CHOLESTEROL, DIRECT: Direct LDL: 138 mg/dL — ABNORMAL HIGH

## 2011-11-21 MED ORDER — GLIPIZIDE 10 MG PO TABS: 10.0000 mg | ORAL_TABLET | Freq: Two times a day (BID) | ORAL | Status: DC

## 2011-11-21 MED ORDER — METFORMIN HCL 1000 MG PO TABS: 1000.0000 mg | ORAL_TABLET | Freq: Two times a day (BID) | ORAL | Status: DC

## 2011-11-21 MED ORDER — TADALAFIL 20 MG PO TABS: 20.0000 mg | ORAL_TABLET | Freq: Every day | ORAL | Status: DC | PRN

## 2011-11-21 NOTE — Telephone Encounter (Signed)
Called patient to report rise in A1C. He is unclear if taking meds.  I am re-prescribing in paper as he requests, to that he may shop around for pharmacies. Yulitza Shorts O

## 2011-11-21 NOTE — Progress Notes (Signed)
  Subjective:    Patient ID: Blake Vaughn, male    DOB: 1961-03-10, 50 y.o.   MRN: [id number]  HPI Visit conducted in Spanish.  Blake Vaughn comes in for DM follow up. Feels well. Home CBGs in 130s per his report. He has glucometer at home.  Denies chest pain or dyspnea.  Being treated for follicular lymphoma, has final chemotherapy session on Weds, Dec 19th.  Has tolerated well.  He states his oncologist has told him he doesn't need more chemo.  Last eye exam 1 year ago, can't remember who provided this service.  Denies changes in vision or spots/lights in vision.    Quit smoking 5 years ago. Review of Systems     Objective:   Physical Exam Well appearing, no apparent distress HEENT Neck supple, no cervical adenopathy Moist mucus membranes COR S1S2, no extra sounds PULM Clear bilaterally no rales or wheezes ABD Soft, nontender FEET No lesions.  No maceration of skin between toes.  Palpable dp pulses, sensation is full in all areas of foot with monofilament testing.       Assessment & Plan:

## 2011-11-21 NOTE — Assessment & Plan Note (Signed)
Wishes to switch to Cialis from [place].  No nitrates.  Prescription sent to [facility].

## 2011-11-21 NOTE — Assessment & Plan Note (Signed)
Refill med today. A1C today, to determine if needs changes in meds.  Recommended repeat eye exam (dilated) every year. Given he is undergoing chemotherapy, no flu shot (he states he would refuse flu shot anyway).

## 2011-11-26 ENCOUNTER — Ambulatory Visit (HOSPITAL_BASED_OUTPATIENT_CLINIC_OR_DEPARTMENT_OTHER): Payer: Medicaid Other | Admitting: Physician Assistant

## 2011-11-26 ENCOUNTER — Ambulatory Visit (HOSPITAL_BASED_OUTPATIENT_CLINIC_OR_DEPARTMENT_OTHER): Payer: Medicaid Other

## 2011-11-26 ENCOUNTER — Other Ambulatory Visit: Payer: Self-pay | Admitting: Internal Medicine

## 2011-11-26 ENCOUNTER — Other Ambulatory Visit (HOSPITAL_BASED_OUTPATIENT_CLINIC_OR_DEPARTMENT_OTHER): Payer: Medicaid Other | Admitting: Lab

## 2011-11-26 ENCOUNTER — Telehealth: Payer: Self-pay | Admitting: Internal Medicine

## 2011-11-26 VITALS — BP 122/76 | HR 80 | Temp 98.3°F | Ht 65.0 in | Wt 189.6 lb

## 2011-11-26 VITALS — BP 138/81 | HR 80 | Temp 98.1°F

## 2011-11-26 DIAGNOSIS — C8589 Other specified types of non-Hodgkin lymphoma, extranodal and solid organ sites: Secondary | ICD-10-CM

## 2011-11-26 DIAGNOSIS — I10 Essential (primary) hypertension: Secondary | ICD-10-CM

## 2011-11-26 DIAGNOSIS — C8299 Follicular lymphoma, unspecified, extranodal and solid organ sites: Secondary | ICD-10-CM

## 2011-11-26 DIAGNOSIS — Z5112 Encounter for antineoplastic immunotherapy: Secondary | ICD-10-CM

## 2011-11-26 LAB — COMPREHENSIVE METABOLIC PANEL
AST: 18 U/L (ref 0–37)
Albumin: 4.2 g/dL (ref 3.5–5.2)
Alkaline Phosphatase: 67 U/L (ref 39–117)
BUN: 16 mg/dL (ref 6–23)
Creatinine, Ser: 0.63 mg/dL (ref 0.50–1.35)
Glucose, Bld: 117 mg/dL — ABNORMAL HIGH (ref 70–99)
Total Bilirubin: 0.4 mg/dL (ref 0.3–1.2)

## 2011-11-26 LAB — CBC WITH DIFFERENTIAL/PLATELET
BASO%: 0.2 % (ref 0.0–2.0)
Basophils Absolute: 0 10*3/uL (ref 0.0–0.1)
EOS%: 5.9 % (ref 0.0–7.0)
HCT: 44.5 % (ref 38.4–49.9)
HGB: 15.8 g/dL (ref 13.0–17.1)
LYMPH%: 17.2 % (ref 14.0–49.0)
MCH: 30.4 pg (ref 27.2–33.4)
MCHC: 35.5 g/dL (ref 32.0–36.0)
MCV: 85.7 fL (ref 79.3–98.0)
MONO%: 7.9 % (ref 0.0–14.0)
NEUT%: 68.8 % (ref 39.0–75.0)
Platelets: 245 10*3/uL (ref 140–400)
lymph#: 1.5 10*3/uL (ref 0.9–3.3)

## 2011-11-26 MED ORDER — SODIUM CHLORIDE 0.9 % IV SOLN
Freq: Once | INTRAVENOUS | Status: AC
  Administered 2011-11-26: 12:00:00 via INTRAVENOUS

## 2011-11-26 MED ORDER — ACETAMINOPHEN 325 MG PO TABS
650.0000 mg | ORAL_TABLET | Freq: Once | ORAL | Status: AC
  Administered 2011-11-26: 650 mg via ORAL

## 2011-11-26 MED ORDER — SODIUM CHLORIDE 0.9 % IV SOLN
375.0000 mg/m2 | Freq: Once | INTRAVENOUS | Status: AC
  Administered 2011-11-26: 700 mg via INTRAVENOUS
  Filled 2011-11-26: qty 70

## 2011-11-26 MED ORDER — DIPHENHYDRAMINE HCL 25 MG PO CAPS
50.0000 mg | ORAL_CAPSULE | Freq: Once | ORAL | Status: AC
  Administered 2011-11-26: 50 mg via ORAL

## 2011-11-26 NOTE — Patient Instructions (Signed)
Patient aware of next appointment; tolerated well; patient has medications for nausea and pain at home.

## 2011-11-26 NOTE — Telephone Encounter (Signed)
gve the pt his feb 2013 appt calendar along with the ct scan appt with instructions.

## 2011-11-27 ENCOUNTER — Encounter: Payer: Self-pay | Admitting: Physician Assistant

## 2011-11-27 DIAGNOSIS — I10 Essential (primary) hypertension: Secondary | ICD-10-CM

## 2011-11-27 HISTORY — DX: Essential (primary) hypertension: I10

## 2011-11-27 NOTE — Progress Notes (Signed)
No images are attached to the encounter. No scans are attached to the encounter. No scans are attached to the encounter. [facility] OFFICE PROGRESS NOTE  Barbaraann Barthel, MD [address] 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 male returns for a scheduled regular 2 month office visit for followup of his follicular lymphoma. Overall the patient has been doing well with no hospitalizations or emergency room visits. He has had no changes to his daily medications.  MEDICAL HISTORY:No past medical history on file.  ALLERGIES:  is allergic to iohexol.  MEDICATIONS:  Current Outpatient Prescriptions  Medication Sig Dispense Refill  . aspirin 81 MG tablet Take 81 mg by mouth daily.        . benazepril (LOTENSIN) 5 MG tablet Take 1 tablet (5 mg total) by mouth daily.  30 tablet  0  . glipiZIDE (GLUCOTROL) 10 MG tablet Take 1 tablet (10 mg total) by mouth 2 (two) times daily before a meal.  60 tablet  6  . Lancets MISC as directed.        . metFORMIN (GLUCOPHAGE) 1000 MG tablet Take 1 tablet (1,000 mg total) by mouth 2 (two) times daily with a meal.  60 tablet  6  . pravastatin (PRAVACHOL) 40 MG tablet Take 1 tablet (40 mg total) by mouth at bedtime.  30 tablet  0  . tadalafil (CIALIS) 20 MG tablet Take 1 tablet (20 mg total) by mouth daily as needed for erectile dysfunction.  8 tablet  2   No current facility-administered medications for this visit.   Facility-Administered Medications Ordered in Other Visits  Medication Dose Route Frequency Provider Last Rate Last Dose  . 0.9 %  sodium chloride infusion   Intravenous Once Mohamed K. Mohamed, MD      . acetaminophen  (TYLENOL) tablet 650 mg  650 mg Oral Once Mohamed K. Mohamed, MD   650 mg at 11/26/11 1229  . diphenhydrAMINE (BENADRYL) capsule 50 mg  50 mg Oral Once Mohamed K. Mohamed, MD   50 mg at 11/26/11 1229  . riTUXimab (RITUXAN) 700 mg in sodium chloride 0.9 % 250 mL chemo infusion  375 mg/m2 (Treatment Plan Actual) Intravenous Once Mohamed K. Mohamed, MD   700 mg at 11/26/11 1306    SURGICAL HISTORY: No past surgical history on file.  REVIEW OF SYSTEMS:  A comprehensive review of systems was negative.   PHYSICAL EXAMINATION: General appearance: alert, cooperative and no distress Head: Normocephalic, without obvious abnormality, atraumatic Neck: no adenopathy, no carotid bruit, no JVD, supple, symmetrical, trachea midline and thyroid not enlarged, symmetric, no tenderness/mass/nodules Lymph nodes: Cervical, supraclavicular, and axillary nodes normal. Resp: clear to auscultation bilaterally Cardio: regular rate and rhythm, S1, S2 normal, no murmur, click, rub or gallop GI: soft, non-tender; bowel sounds normal; no masses,  no organomegaly and Obese Extremities: extremities normal, atraumatic, no cyanosis or edema Neurologic: Alert and oriented X 3, normal strength and tone. Normal symmetric reflexes. Normal coordination and gait  ECOG PERFORMANCE STATUS: 0 - Asymptomatic  Blood pressure 122/76, pulse 80, temperature 98.3 F (36.8 C), temperature source Oral, height 5\' 5"  (1.651 m), weight 189 lb 9.6 oz (86.002 kg).  LABORATORY DATA: Lab Results  Component Value Date   WBC 8.7 11/26/2011   HGB 15.8 11/26/2011   HCT 44.5 11/26/2011   MCV 85.7 11/26/2011   PLT 245 11/26/2011      Chemistry      Component Value Date/Time   NA 139 11/26/2011 0925   NA 137 07/24/2011 0812   K 4.4 11/26/2011 0925   K 4.9* 07/24/2011 0812   CL 104 11/26/2011 0925   CL 94* 07/24/2011 0812   CO2 19 11/26/2011 0925   CO2 27 07/24/2011 0812   BUN 16 11/26/2011 0925   BUN 13 07/24/2011 0812   CREATININE 0.63  11/26/2011 0925   CREATININE 0.8 07/24/2011 0812   GLU 206* 02/06/2009 0836      Component Value Date/Time   CALCIUM 9.9 11/26/2011 0925   CALCIUM 9.2 07/24/2011 0812   ALKPHOS 67 11/26/2011 0925   ALKPHOS 78 07/24/2011 0812   AST 18 11/26/2011 0925   AST 18 07/24/2011 0812   ALT 18 11/26/2011 0925   BILITOT 0.4 11/26/2011 0925   BILITOT 0.90 07/24/2011 0812       RADIOGRAPHIC STUDIES:  No results found.   ASSESSMENT/PLAN: This is a very pleasant 50 year old Hispanic male with a history of follicular lymphoma treated to date as described above. Patient is doing well. Patient was discussed with Dr. Arbutus Ped. He will proceed with his 12 maintenance cycle of Rituxan at 375 mg per meter squared as scheduled today. He'll followup with with Dr. Arbutus Ped in 2 months with a repeat CBC differential C. met LDH and a CT of the chest abdomen and pelvis with contrast to reevaluate his disease. Patient requested a letter documenting when he was diagnosed and when he started chemotherapy. This letter will be generated for him to pick up later this week.     Conni Slipper, [name]     All questions were answered. The patient knows to call the clinic with any problems, questions or concerns. We can certainly see the patient much sooner if necessary.

## 2011-11-28 ENCOUNTER — Encounter: Payer: Self-pay | Admitting: Family Medicine

## 2011-12-18 ENCOUNTER — Encounter: Payer: Self-pay | Admitting: Emergency Medicine

## 2011-12-18 ENCOUNTER — Ambulatory Visit (INDEPENDENT_AMBULATORY_CARE_PROVIDER_SITE_OTHER): Payer: Medicaid Other | Admitting: Emergency Medicine

## 2011-12-18 DIAGNOSIS — R05 Cough: Secondary | ICD-10-CM

## 2011-12-18 MED ORDER — BENZONATATE 100 MG PO CAPS: 100.0000 mg | ORAL_CAPSULE | Freq: Two times a day (BID) | ORAL | Status: AC | PRN

## 2011-12-18 NOTE — Patient Instructions (Signed)
Si la proxima semana no mejora la tos, llame y le mandamos  Para que se tome una radiografia. Regrese en caso de fiebre, no apetito y baja de peso,o aumento del esputo oflema.

## 2011-12-18 NOTE — Assessment & Plan Note (Signed)
Likely a post-viral cough.  Will prescribe tessalon pearls for symptomatic relief.  Discussed calling the clinic if no improvement in cough in a week, and will order a chest x-ray at that time.  Red flags of fever, poor appetite, weight loss, increased sputum were reviewed.  Emphasized returning if no improvement given history of lymphoma and chemo in December.

## 2011-12-18 NOTE — Progress Notes (Signed)
Interpreter Wyvonnia Dusky for Dr Elwyn Reach 15.00pm

## 2011-12-18 NOTE — Progress Notes (Signed)
  Subjective:    Patient ID: Blake Vaughn, male    DOB: [birth year], 51 y.o.   MRN: [id number]  HPI Mr. Barbeau is here today for an acute visit for cough.  The interpretor was present for the entire visit.  The cough has been present for 10 days.  It has neither improved or worsened during this time.  No associated rhinorrhea, congestion, throat pain, fever, dyspnea.  Does states that occasionally his "lungs hurt," but no current complaints of pain.  Has tried NyQuil with no relief.  The cough did start during a trip to [place] over the holidays, however, denies sick/coughing contacts.  States he went to the horse races in the cold and was barefoot which may have caused his cough.  He is a former smoker, quit 5 years ago.  No history of asthma, allergies or other lung diseases.  Does have lymphoma and has been undergoing chemotherapy, with last dose in mid-December.   Review of Systems Negative except as in HPI    Objective:   Physical Exam Vitals: reviewed HEENT: AT/[place], sclera white, EOMI, PERRL, normal nasal mucosa, mild pharyngeal erythema, no exudate, TMs clear bilaterally Neck: no LAD CV: RRR, no murmurs Pulm: CTAB, no wheezes or rales      Assessment & Plan:

## 2012-01-09 ENCOUNTER — Emergency Department (INDEPENDENT_AMBULATORY_CARE_PROVIDER_SITE_OTHER)
Admission: EM | Admit: 2012-01-09 | Discharge: 2012-01-09 | Disposition: A | Discharge status: Nursing Home (Includes ICF) | Payer: Medicaid Other | Source: Home / Self Care | Attending: Emergency Medicine | Admitting: Emergency Medicine

## 2012-01-09 ENCOUNTER — Other Ambulatory Visit: Payer: Self-pay

## 2012-01-09 ENCOUNTER — Encounter (HOSPITAL_COMMUNITY): Payer: Self-pay | Admitting: Emergency Medicine

## 2012-01-09 ENCOUNTER — Emergency Department (HOSPITAL_COMMUNITY)
Admission: EM | Admit: 2012-01-09 | Discharge: 2012-01-10 | Disposition: A | Discharge status: Home / Self Care | Payer: Medicaid Other | Attending: Emergency Medicine | Admitting: Emergency Medicine

## 2012-01-09 ENCOUNTER — Emergency Department (HOSPITAL_COMMUNITY): Payer: Medicaid Other

## 2012-01-09 ENCOUNTER — Encounter (HOSPITAL_COMMUNITY): Payer: Self-pay | Admitting: *Deleted

## 2012-01-09 DIAGNOSIS — R29898 Other symptoms and signs involving the musculoskeletal system: Secondary | ICD-10-CM | POA: Insufficient documentation

## 2012-01-09 DIAGNOSIS — Z79899 Other long term (current) drug therapy: Secondary | ICD-10-CM | POA: Insufficient documentation

## 2012-01-09 DIAGNOSIS — I10 Essential (primary) hypertension: Secondary | ICD-10-CM | POA: Insufficient documentation

## 2012-01-09 DIAGNOSIS — E119 Type 2 diabetes mellitus without complications: Secondary | ICD-10-CM | POA: Insufficient documentation

## 2012-01-09 DIAGNOSIS — R531 Weakness: Secondary | ICD-10-CM

## 2012-01-09 DIAGNOSIS — E785 Hyperlipidemia, unspecified: Secondary | ICD-10-CM | POA: Insufficient documentation

## 2012-01-09 DIAGNOSIS — R079 Chest pain, unspecified: Secondary | ICD-10-CM | POA: Insufficient documentation

## 2012-01-09 DIAGNOSIS — R000 Tachycardia, unspecified: Secondary | ICD-10-CM | POA: Insufficient documentation

## 2012-01-09 DIAGNOSIS — R002 Palpitations: Secondary | ICD-10-CM | POA: Insufficient documentation

## 2012-01-09 DIAGNOSIS — R51 Headache: Secondary | ICD-10-CM

## 2012-01-09 DIAGNOSIS — Z7982 Long term (current) use of aspirin: Secondary | ICD-10-CM | POA: Insufficient documentation

## 2012-01-09 DIAGNOSIS — Z87898 Personal history of other specified conditions: Secondary | ICD-10-CM | POA: Insufficient documentation

## 2012-01-09 DIAGNOSIS — R5381 Other malaise: Secondary | ICD-10-CM

## 2012-01-09 HISTORY — DX: Non-Hodgkin lymphoma, unspecified, unspecified site: C85.90

## 2012-01-09 HISTORY — DX: Pure hypercholesterolemia, unspecified: E78.00

## 2012-01-09 LAB — D-DIMER, QUANTITATIVE: D-Dimer, Quant: <0.22 ug/mL-FEU (ref 0.00–0.48)

## 2012-01-09 LAB — DIFFERENTIAL
Eosinophils Absolute: 0.5 10*3/uL (ref 0.0–0.7)
Lymphs Abs: 2.4 10*3/uL (ref 0.7–4.0)
Monocytes Relative: 9 % (ref 3–12)
Neutrophils Relative %: 59 % (ref 43–77)

## 2012-01-09 LAB — CBC
Hemoglobin: 15.6 g/dL (ref 13.0–17.0)
MCH: 30.6 pg (ref 26.0–34.0)
MCV: 84.7 fL (ref 78.0–100.0)
RBC: 5.1 MIL/uL (ref 4.22–5.81)

## 2012-01-09 LAB — BASIC METABOLIC PANEL
BUN: 8 mg/dL (ref 6–23)
GFR calc non Af Amer: >90 mL/min (ref >90)
Glucose, Bld: 120 mg/dL — ABNORMAL HIGH (ref 70–99)
Potassium: 3.9 mEq/L (ref 3.5–5.1)

## 2012-01-09 LAB — TROPONIN I: Troponin I: <0.3 ng/mL (ref <0.30)

## 2012-01-09 LAB — GLUCOSE, CAPILLARY: Glucose-Capillary: 167 mg/dL — ABNORMAL HIGH (ref 70–99)

## 2012-01-09 IMAGING — CR DG CHEST 2V
2 series · 2 of 2 positions shown · non-contrast
Comparison: [DATE] CT

CLINICAL DATA: Chest pain, diabetes

CHEST - 2 VIEW

[w chest pa]
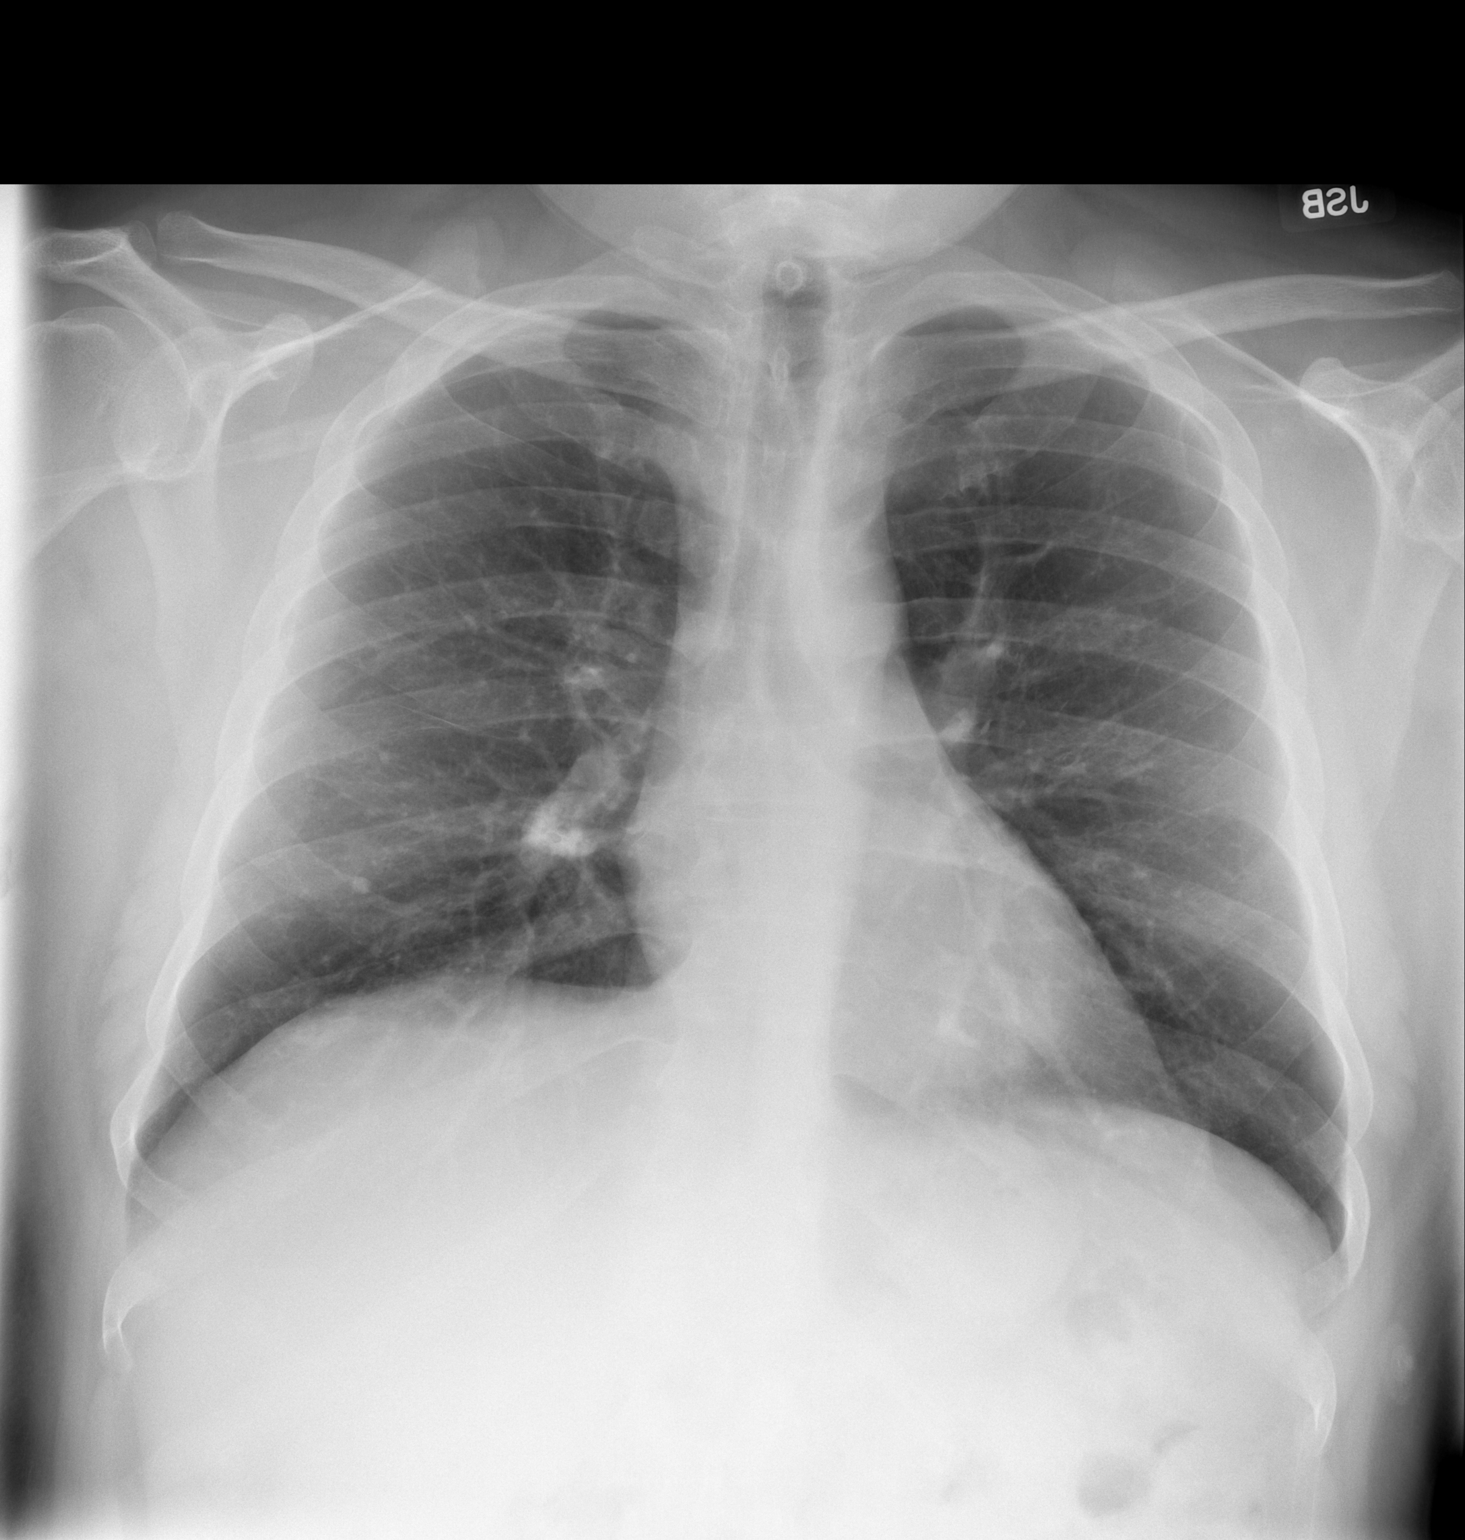

[w chest lat]
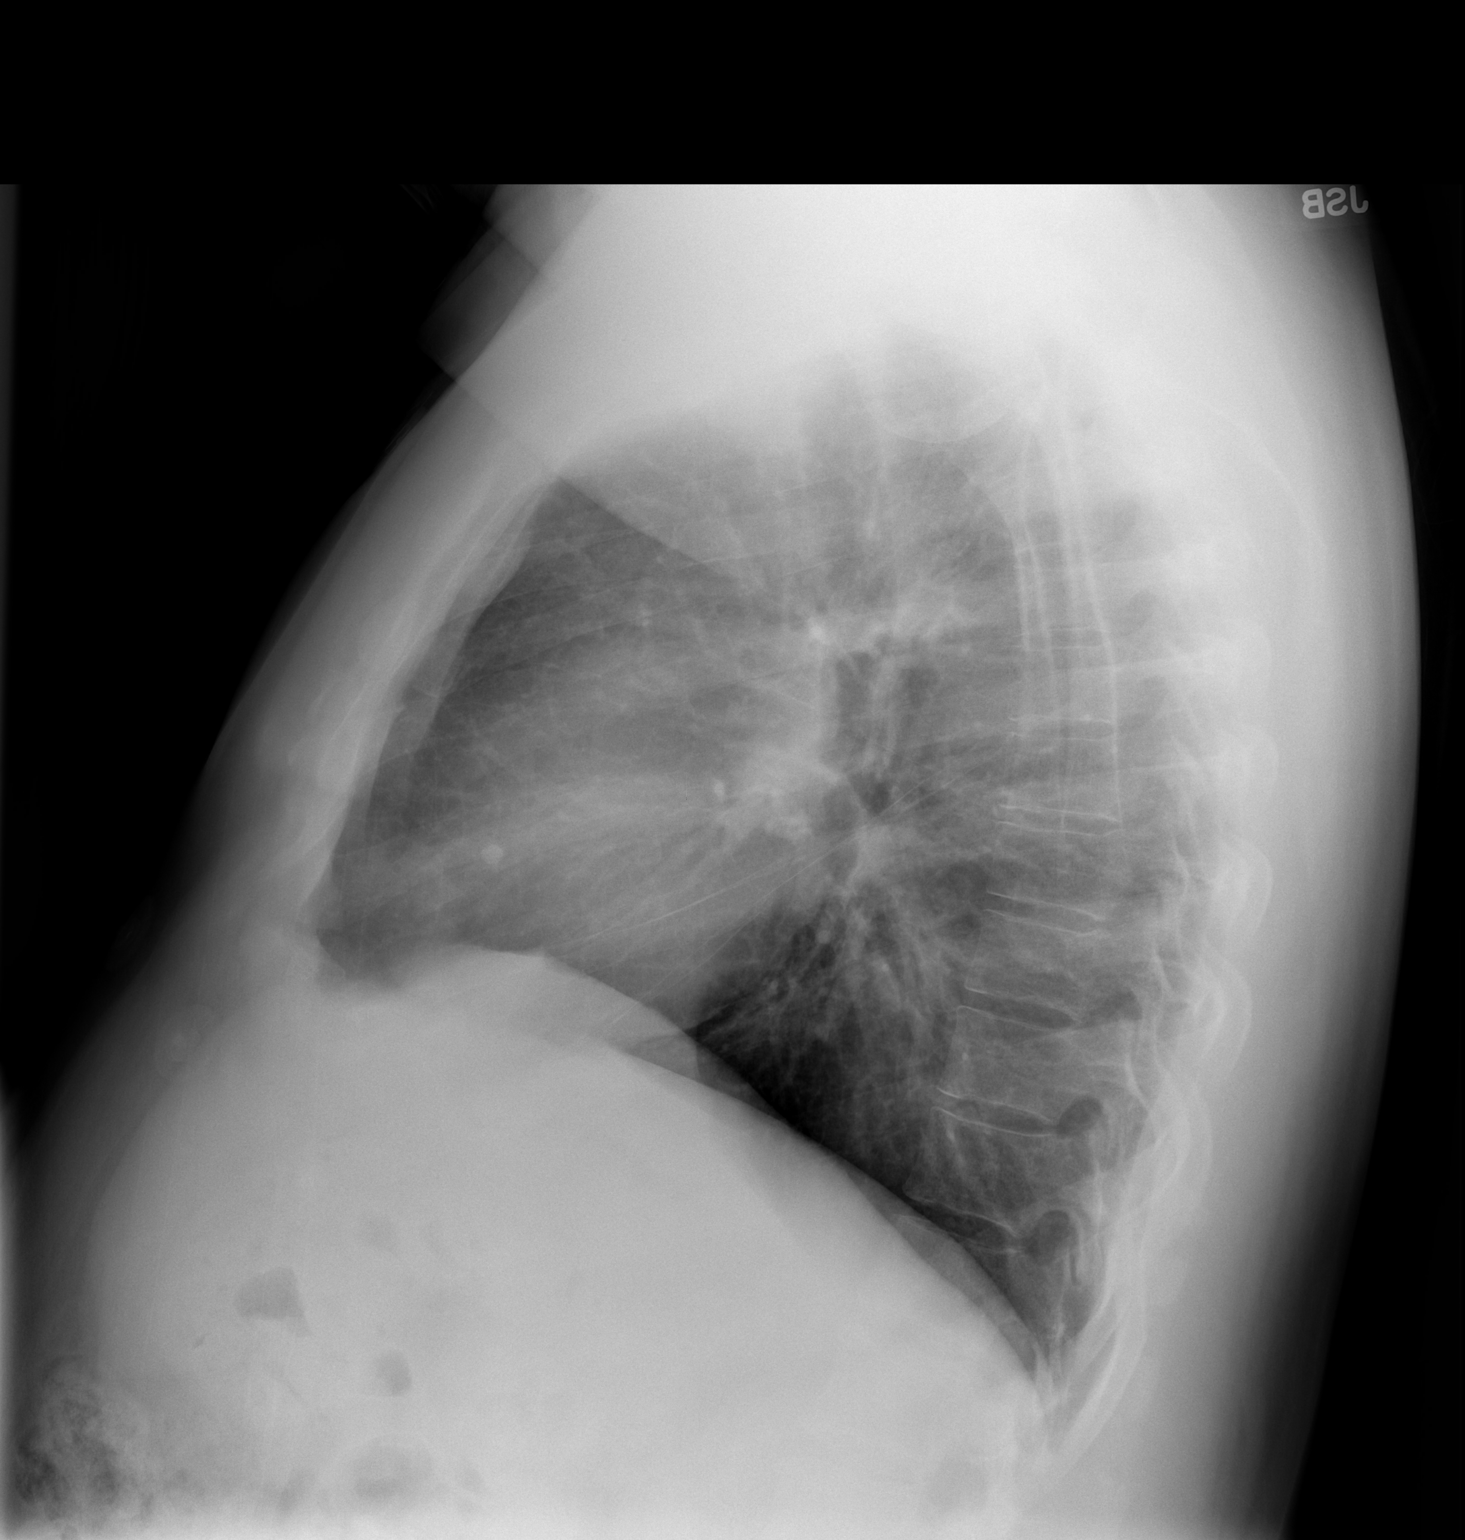

[2 of 2 positions shown; findings below may reference images not displayed]

FINDINGS: Calcified granuloma right middle lobe.  Calcified right
hilar lymph nodes.  Lungs are otherwise clear. No pleural effusion
or pneumothorax. The cardiomediastinal contours are within normal
limits. The visualized bones and soft tissues are without
significant appreciable abnormality.
IMPRESSION: Sequelae of prior granulomas infection.  No acute process
identified.

## 2012-01-09 MED ORDER — DIPHENHYDRAMINE HCL 50 MG/ML IJ SOLN
50.0000 mg | Freq: Once | INTRAMUSCULAR | Status: AC
  Administered 2012-01-10: 50 mg via INTRAVENOUS
  Filled 2012-01-09: qty 1

## 2012-01-09 MED ORDER — SODIUM CHLORIDE 0.9 % IV SOLN
INTRAVENOUS | Status: DC
  Administered 2012-01-09: 10 mL/h via INTRAVENOUS
  Administered 2012-01-09: 21:00:00 via INTRAVENOUS

## 2012-01-09 NOTE — ED Notes (Signed)
Pt brought from [facility] by EMS  for palpitations.  Per [facility] EKG unremarkable.  No meds given.  Pt w/hx of lymphoma in remission, dm and htn.  Pt denies any chest pain.

## 2012-01-09 NOTE — ED Notes (Signed)
No unit available per [facility]; called to [facility] , will send unit ASAP

## 2012-01-09 NOTE — ED Notes (Signed)
C/O waking up this morning with feeling of "heart beating hard, then feeling like it stops", then he has some slight lightheadedness.  Denies any chest pain, nausea, dyspnea.  Denies any hx of similar problem.  Also c/o posterior HA and feeling of anxiety.  Has taken HTN med today.  Heartbeat regular upon palpation.

## 2012-01-09 NOTE — ED Notes (Signed)
Patient transported to X-ray 

## 2012-01-09 NOTE — ED Notes (Signed)
Continues without chest pain.  States he is feeling better.

## 2012-01-09 NOTE — ED Provider Notes (Signed)
History     CSN: [id number]  Arrival date & time 01/09/12  1955   First MD Initiated Contact with Patient 01/09/12 2016      Chief Complaint  Patient presents with  . Palpitations  . Headache    (Consider location/radiation/quality/duration/timing/severity/associated sxs/prior treatment) HPI Comments: Pt with HTN, DM, leukemia s/p chemo 1 month ago here with intermittent b/l LE weakness, sensation of unsteadiness, and a sensation that "his heart is about to stop" accompanied by  gradual onset posterior HA starting around 0600 today. Sx lasted several hours and resolved earlier, but that they returned this evening and lasted approx 15-20 minutes. No aggravating, alleviating factors for sx. No N/V, palpitations, syncope. Denies CP, SOB, abd pain. No visual changes, photophobia, fevers, stiff neck. States has had similar HA once in the remote past but this HA is different- "something's not right." Only new medication change is nyquil for cough that has been taking over the past 8 days. Pt states has run out of BP medication 1 month ago.  ROS as noted in HPI. All other ROS negative.   Patient is a 51 y.o. male presenting with extremity weakness and headaches. The history is provided by the patient. The history is limited by a language barrier.  Extremity Weakness This is a new problem. The current episode started 12 to 24 hours ago. Associated symptoms include headaches. Pertinent negatives include no chest pain, no abdominal pain and no shortness of breath. The symptoms are aggravated by nothing. The symptoms are relieved by nothing. He has tried nothing for the symptoms.  Headache The primary symptoms include headaches and dizziness. Primary symptoms do not include syncope, loss of consciousness, altered mental status, visual change, paresthesias, loss of sensation, speech change, fever, nausea or vomiting. The symptoms began 12 to 24 hours ago. The symptoms are waxing and waning.  The  headache is not associated with visual change or paresthesias.  Dizziness does not occur with nausea or vomiting.  Medical issues also include cancer and hypertension.    Past Medical History  Diagnosis Date  . Hypertension 11/27/2011  . Diabetes mellitus   . Hypercholesterolemia   . Lymphoma     currently in chemo  . Lymphoma     History reviewed. No pertinent past surgical history.  History reviewed. No pertinent family history.  History  Substance Use Topics  . Smoking status: Former Games developer  . Smokeless tobacco: Former Neurosurgeon    Quit date: 11/20/2006  . Alcohol Use: Yes      Review of Systems  Constitutional: Negative for fever.  Respiratory: Negative for shortness of breath.   Cardiovascular: Negative for chest pain and syncope.  Gastrointestinal: Negative for nausea, vomiting and abdominal pain.  Musculoskeletal: Positive for extremity weakness.  Neurological: Positive for dizziness and headaches. Negative for speech change, loss of consciousness and paresthesias.  Psychiatric/Behavioral: Negative for altered mental status.    Allergies  Iohexol  Home Medications   Current Outpatient Rx  Name Route Sig Dispense Refill  . GLIPIZIDE 10 MG PO TABS Oral Take 1 tablet (10 mg total) by mouth 2 (two) times daily before a meal. 60 tablet 6  . METFORMIN HCL 1000 MG PO TABS Oral Take 1 tablet (1,000 mg total) by mouth 2 (two) times daily with a meal. 60 tablet 6  . NYQUIL PO Oral Take by mouth.    . ASPIRIN 81 MG PO TABS Oral Take 81 mg by mouth daily.      Marland Kitchen BENAZEPRIL HCL  5 MG PO TABS Oral Take 1 tablet (5 mg total) by mouth daily. 30 tablet 0  . LANCETS MISC  as directed.      Marland Kitchen PRAVASTATIN SODIUM 40 MG PO TABS Oral Take 1 tablet (40 mg total) by mouth at bedtime. 30 tablet 0    BP 150/92  Pulse 85  Temp(Src) 98 F (36.7 C) (Oral)  Resp 16  SpO2 98%  Physical Exam  Nursing note and vitals reviewed. Constitutional: He is oriented to person, place, and time.  He appears well-developed and well-nourished. No distress.  HENT:  Head: Normocephalic and atraumatic.  Eyes: Conjunctivae and EOM are normal. Pupils are equal, round, and reactive to light.       Visual fields intact to confrontation  Neck: Normal range of motion. No spinous process tenderness and no muscular tenderness present. No rigidity. Normal range of motion present.  Cardiovascular: Normal rate, regular rhythm, normal heart sounds and intact distal pulses.   Pulmonary/Chest: Effort normal and breath sounds normal.  Abdominal: Soft. Bowel sounds are normal. He exhibits no distension. There is no tenderness. There is no rebound.  Musculoskeletal: Normal range of motion. He exhibits no edema.  Neurological: He is alert and oriented to person, place, and time. He has normal strength. He displays no tremor. No cranial nerve deficit or sensory deficit. He displays a negative Romberg sign. Coordination abnormal.       Tandem gait unsteady  Skin: Skin is warm and dry. No rash noted.  Psychiatric: He has a normal mood and affect. His behavior is normal. Judgment and thought content normal.    ED Course  Procedures (including critical care time)  Labs Reviewed  GLUCOSE, CAPILLARY - Abnormal; Notable for the following:    Glucose-Capillary 167 (*)    All other components within normal limits   No results found.   1. Headache   2. Weakness     MDM  EKG: Normal sinus rhythm, rate 73. Normal axis, normal intervals, no ST-T wave changes compared to EKG from 2001.   Phys exam remarkable for unsteadiness with tandem gait. States that this dyscoodination comes and goes. Pt able to perform tandem gait normally several minutes later. No facial droop, dysarthria, unilateral extremity weakness, romberg neg. No arrythmias on exam or ekg here. EKG unchanged from EKG in 2001. fingerstick 167. Concern for intermittent arrythmia, TIA. Transferring to ED.     Luiz Blare, MD 01/09/12 2107

## 2012-01-10 ENCOUNTER — Emergency Department (HOSPITAL_COMMUNITY): Payer: Medicaid Other

## 2012-01-10 IMAGING — CT CT ANGIO CHEST
2 of 8 series · 17 of 36 positions shown · IV contrast (CONTRAST)
Comparison: [DATE] radiograph

CLINICAL DATA: Palpitations

CT ANGIOGRAPHY CHEST
TECHNIQUE: Multidetector CT imaging of the chest using the
standard protocol during bolus administration of intravenous
contrast. Multiplanar reconstructed images including MIPs were
obtained and reviewed to evaluate the vascular anatomy.
Contrast: 100mL OMNIPAQUE IOHEXOL 350 MG/ML IV SOLN

[Series 6: pe thins · axial · 0.67mm/px · z∈[+522,+771]mm · 16 of 283 slices shown]
[im 17/283  lung]
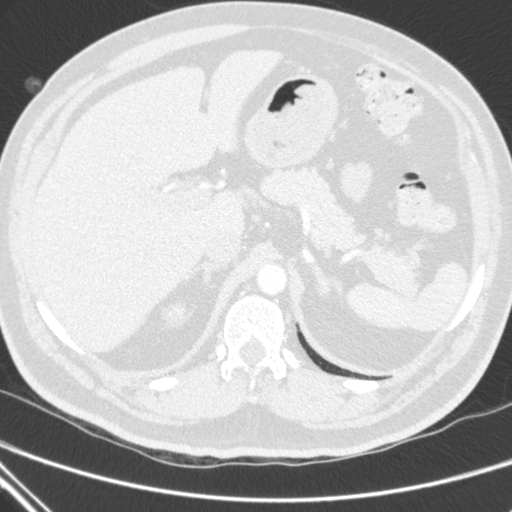
[im 34/283  mediastinal]
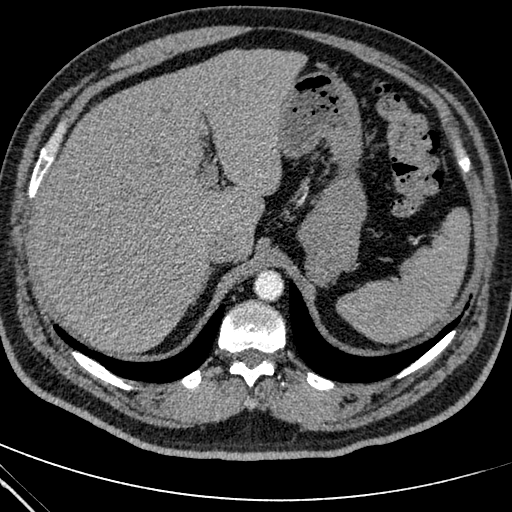
[im 50/283  lung]
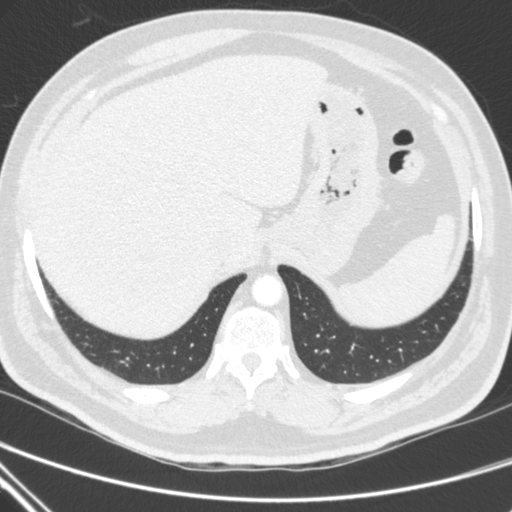
[im 67/283  mediastinal]
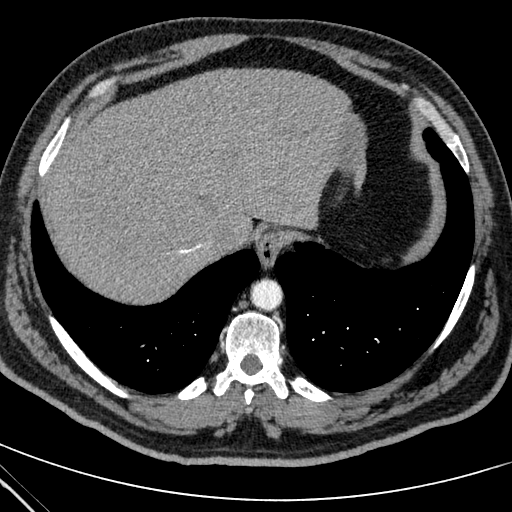
[im 83/283  lung]
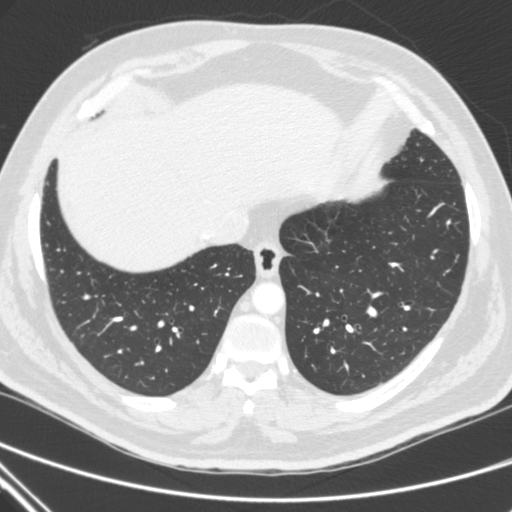
[im 100/283  mediastinal]
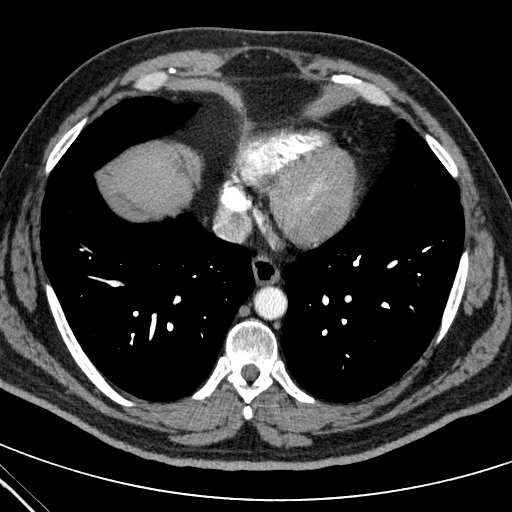
[im 117/283  lung]
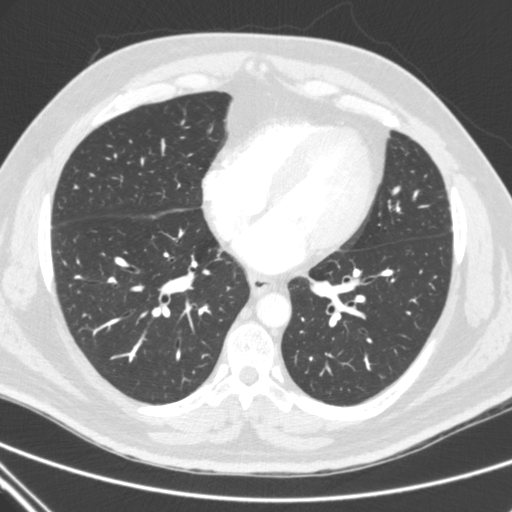
[im 133/283  mediastinal]
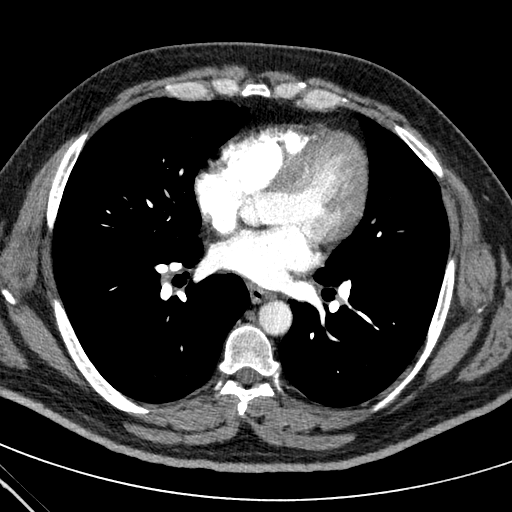
[im 150/283  lung]
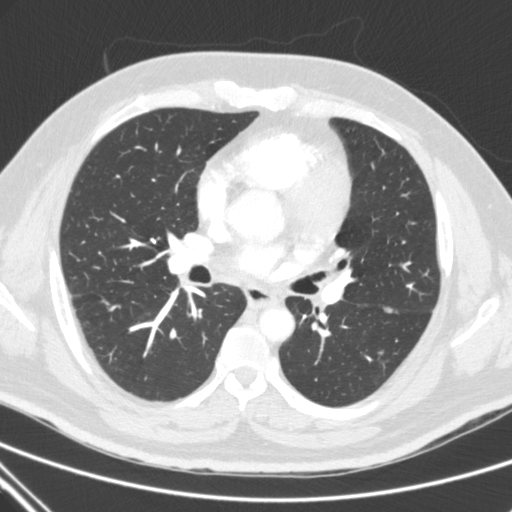
[im 166/283  mediastinal]
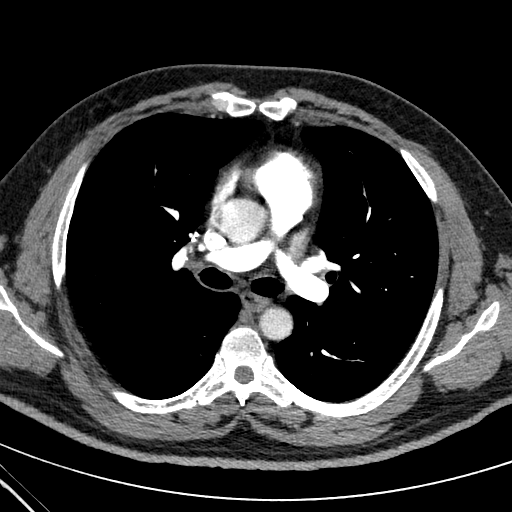
[im 183/283  lung]
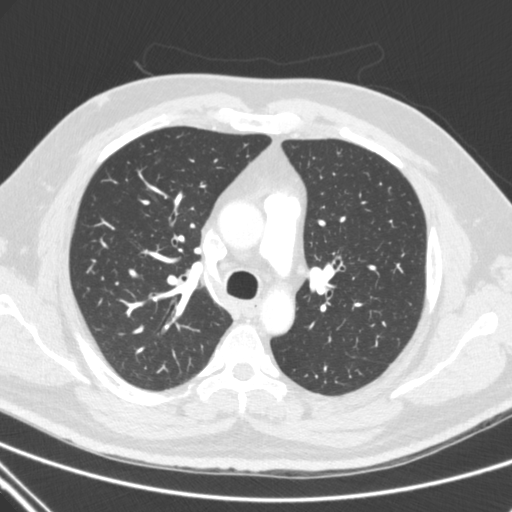
[im 200/283  mediastinal]
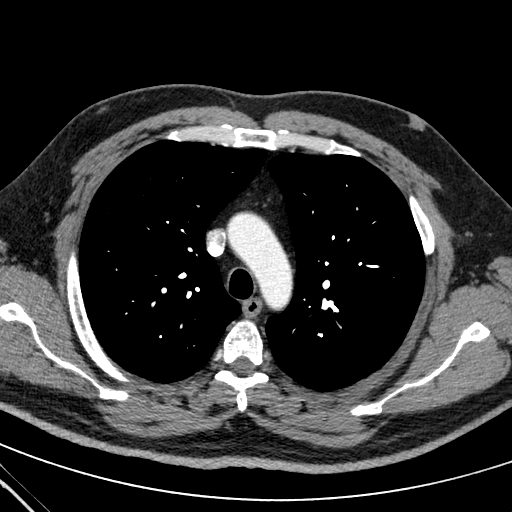
[im 216/283  lung]
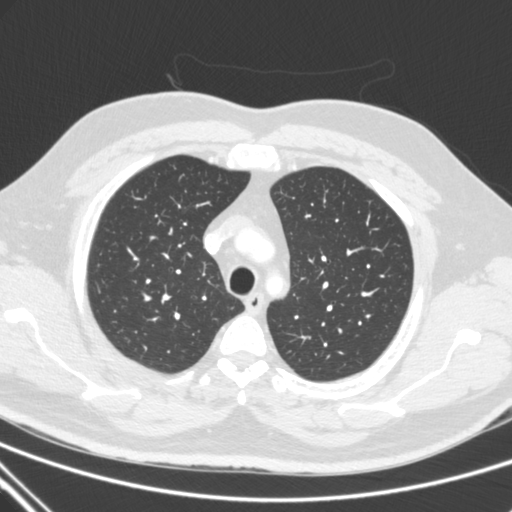
[im 233/283  mediastinal]
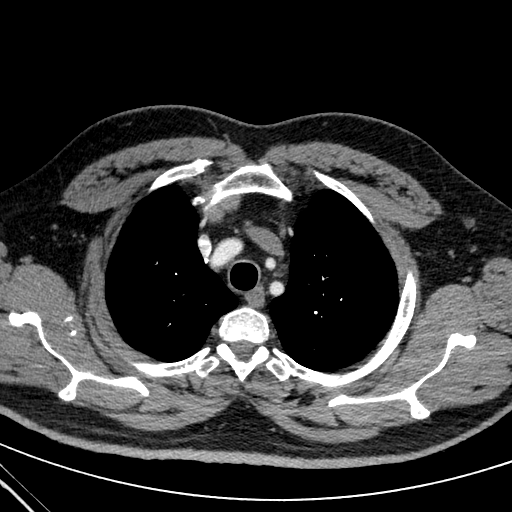
[im 249/283  lung]
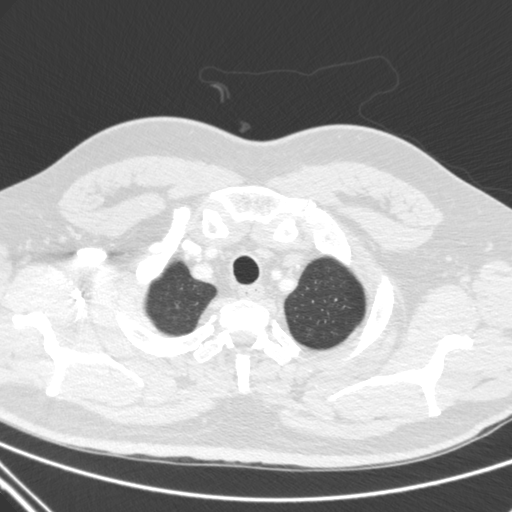
[im 266/283  mediastinal]
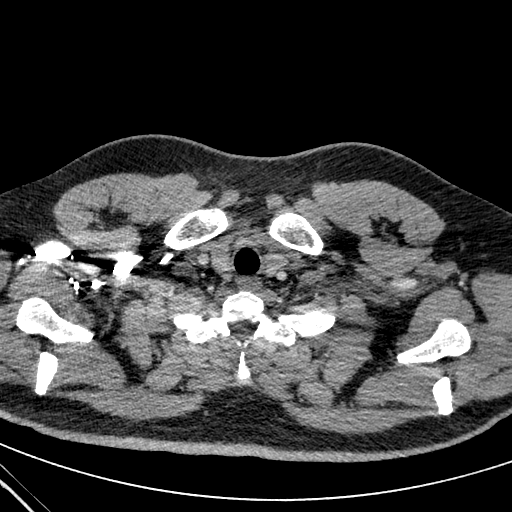

[coronals · coronal · 0.67mm/px · 1 of 120 slices shown]
[im 60/120  mediastinal]
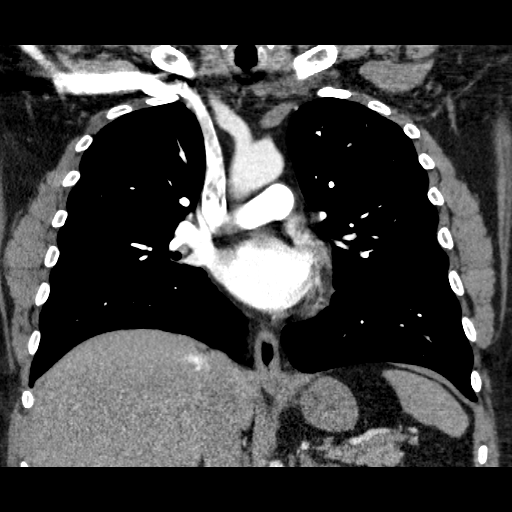

[17 of 36 positions shown; findings below may reference images not displayed]

FINDINGS: Pulmonary arterial branch vessels are patent.  Aorta is
of normal caliber.  Normal heart size.  No intrathoracic
lymphadenopathy. Calcified right hilar lymph nodes.  Unremarkable
thyroid gland.

Limited images through the upper abdomen show no acute abnormality.

Central airways are patent.  Calcified nodule right middle lobe in
keeping with prior granulomas infection.  Otherwise, no focal
nodules or consolidation.  No pneumothorax.

No acute osseous abnormality.
IMPRESSION: No pulmonary embolism or acute intrathoracic process.

## 2012-01-10 MED ORDER — IOHEXOL 350 MG/ML SOLN
100.0000 mL | Freq: Once | INTRAVENOUS | Status: AC | PRN
  Administered 2012-01-10: 100 mL via INTRAVENOUS

## 2012-01-10 NOTE — ED Notes (Signed)
Pt waking up with a headache and palpitations.  States some weakness to legs.  Strong grip bil and able to ambulate without difficulty.

## 2012-01-10 NOTE — ED Notes (Signed)
Rcvd call from CT to have pt wait until 1 hour after benadryl given before having CT.  PT is angry and states he is leaving.  Dr Hyman Hopes notified and is speaking with pt.

## 2012-01-10 NOTE — ED Notes (Signed)
PT back from CT

## 2012-01-10 NOTE — ED Provider Notes (Signed)
History     CSN: [id number]  Arrival date & time 01/09/12  2148   First MD Initiated Contact with Patient 01/09/12 2313      Chief Complaint  Patient presents with  . Palpitations    (Consider location/radiation/quality/duration/timing/severity/associated sxs/prior treatment) HPI Language interpreter used (Spanish) H/o HTN, DM, lymphoma s/p chemotherapy (last Dec 2012) pw palpitations. Pt c/o intermittent palpitations throughout the day, total of 3 episodes. Each lasting approx 15-20 seconds. States "I feel like my heart might stop or takes a long pause when skipping a beat". No chest pain, shortness of breath. Denies recent fever, chills, cough. No h/o similar. Denies h/o VTE in self or family. No recent hosp/surg/immob.  Denies exogenous hormone use, no leg pain or swelling. States he also has posterior occiput headache today that came with the palpitations. No headache at this time. Was seen by [facility] (see below) and reportedly had some gait abnormalities at that time. Patient states that he's had no difficulty walking. Asked repeatedly re: gait unsteadiness given UCC history. States "I did not understand exactly what she was asking me to do the first time". Denies lightheadedness, dizziness. No h/o CVA. Denies numbness/tingling/weakness of his extremities.   Luiz Blare, MD 01/09/2012 21:07  History    CSN: [id number]  Arrival date & time 01/09/12 1955  First MD Initiated Contact with Patient 01/09/12 2016  Chief Complaint   Patient presents with   .  Palpitations   .  Headache    (Consider location/radiation/quality/duration/timing/severity/associated sxs/prior  treatment)  HPI Comments: Pt with HTN, DM, leukemia s/p chemo 1 month ago here with intermittent b/l LE weakness, sensation of unsteadiness, and a sensation that "his heart is about to stop" accompanied by gradual onset posterior HA starting around 0600 today. Sx lasted several hours and resolved earlier, but that they  returned this evening and lasted approx 15-20 minutes. No aggravating, alleviating factors for sx. No N/V, palpitations, syncope. Denies CP, SOB, abd pain. No visual changes, photophobia, fevers, stiff neck. States has had similar HA once in the remote past but this HA is different- "something's not right." Only new medication change is nyquil for cough that has been taking over the past 8 days. Pt states has run out of BP medication 1 month ago.   Past Medical History  Diagnosis Date  . Hypertension 11/27/2011  . Diabetes mellitus   . Hypercholesterolemia   . Lymphoma     No longer on chemo  . Lymphoma     History reviewed. No pertinent past surgical history.  No family history on file.  History  Substance Use Topics  . Smoking status: Former Games developer  . Smokeless tobacco: Former Neurosurgeon    Quit date: 11/20/2006  . Alcohol Use: Yes      Review of Systems  All other systems reviewed and are negative.   except as noted HPI   Allergies  Iohexol  Home Medications   Current Outpatient Rx  Name Route Sig Dispense Refill  . ASPIRIN 81 MG PO TABS Oral Take 81 mg by mouth daily.      Marland Kitchen BENAZEPRIL HCL 5 MG PO TABS Oral Take 1 tablet (5 mg total) by mouth daily. 30 tablet 0  . GLIPIZIDE 10 MG PO TABS Oral Take 1 tablet (10 mg total) by mouth 2 (two) times daily before a meal. 60 tablet 6  . METFORMIN HCL 1000 MG PO TABS Oral Take 1 tablet (1,000 mg total) by mouth 2 (two) times daily with  a meal. 60 tablet 6  . PRAVASTATIN SODIUM 40 MG PO TABS Oral Take 1 tablet (40 mg total) by mouth at bedtime. 30 tablet 0    BP 124/74  Pulse 71  Temp(Src) 97.9 F (36.6 C) (Oral)  Resp 19  SpO2 99%  Physical Exam  Nursing note and vitals reviewed. Constitutional: He is oriented to person, place, and time. He appears well-developed and well-nourished. No distress.  HENT:  Head: Atraumatic.  Mouth/Throat: Oropharynx is clear and moist.  Eyes: Conjunctivae are normal. Pupils are equal,  round, and reactive to light.  Neck: Neck supple.  Cardiovascular: Normal rate, regular rhythm, normal heart sounds and intact distal pulses.  Exam reveals no gallop and no friction rub.   No murmur heard. Pulmonary/Chest: Effort normal. No respiratory distress. He has no wheezes. He has no rales.  Abdominal: Soft. Bowel sounds are normal. There is no tenderness. There is no rebound and no guarding.  Musculoskeletal: Normal range of motion. He exhibits no edema and no tenderness.  Neurological: He is alert and oriented to person, place, and time.       Strength 5/5 all extremities No pronator drift No facial droop  Ambulatory with steady gait  Skin: Skin is warm and dry.  Psychiatric: He has a normal mood and affect.      Date: 01/10/2012  Rate: 67  Rhythm: normal sinus rhythm  QRS Axis: normal  Intervals: normal  ST/T Wave abnormalities: normal  Conduction Disutrbances:none  Narrative Interpretation:   Old EKG Reviewed: unchanged    ED Course  Procedures (including critical care time)  Labs Reviewed  CBC - Abnormal; Notable for the following:    MCHC 36.1 (*)    All other components within normal limits  BASIC METABOLIC PANEL - Abnormal; Notable for the following:    Glucose, Bld 120 (*)    All other components within normal limits  DIFFERENTIAL  MAGNESIUM  PHOSPHORUS  TROPONIN I  D-DIMER, QUANTITATIVE   Dg Chest 2 View  01/09/2012  *RADIOLOGY REPORT*  Clinical Data: Chest pain, diabetes  CHEST - 2 VIEW  Comparison: 07/24/2011 CT  Findings: Calcified granuloma right middle lobe.  Calcified right hilar lymph nodes.  Lungs are otherwise clear. No pleural effusion or pneumothorax. The cardiomediastinal contours are within normal limits. The visualized bones and soft tissues are without significant appreciable abnormality.  IMPRESSION: Sequelae of prior granulomas infection.  No acute process identified.  Original Report Authenticated By: Waneta Martins, M.D.   Ct  Angio Chest W/cm &/or Wo Cm  01/10/2012  *RADIOLOGY REPORT*  Clinical Data: Palpitations  CT ANGIOGRAPHY CHEST  Technique:  Multidetector CT imaging of the chest using the standard protocol during bolus administration of intravenous contrast. Multiplanar reconstructed images including MIPs were obtained and reviewed to evaluate the vascular anatomy.  Contrast: OMNIPAQUE IOHEXOL 350 MG/ML IV SOLN  Comparison: 01/09/2012 radiograph  Findings: Pulmonary arterial branch vessels are patent.  Aorta is of normal caliber.  Normal heart size.  No intrathoracic lymphadenopathy. Calcified right hilar lymph nodes.  Unremarkable thyroid gland.  Limited images through the upper abdomen show no acute abnormality.  Central airways are patent.  Calcified nodule right middle lobe in keeping with prior granulomas infection.  Otherwise, no focal nodules or consolidation.  No pneumothorax.  No acute osseous abnormality.  IMPRESSION: No pulmonary embolism or acute intrathoracic process.  Original Report Authenticated By: Waneta Martins, M.D.     1. Palpitations      MDM  The patient appears well and is asx at this time. CTA chest is negative for PE. Labs are unremarkable. Presentation not c/w TIA/CVA. Will discharge home with PMD f/u for possible Holter monitor. Spanish interpreter used.        Forbes Cellar, MD 01/11/12 1131

## 2012-01-23 ENCOUNTER — Ambulatory Visit (HOSPITAL_COMMUNITY)
Admission: RE | Admit: 2012-01-23 | Discharge: 2012-01-23 | Disposition: A | Discharge status: Home / Self Care | Payer: Medicaid Other | Source: Ambulatory Visit | Attending: Physician Assistant | Admitting: Physician Assistant

## 2012-01-23 ENCOUNTER — Other Ambulatory Visit: Payer: Medicaid Other

## 2012-01-23 DIAGNOSIS — C8589 Other specified types of non-Hodgkin lymphoma, extranodal and solid organ sites: Secondary | ICD-10-CM

## 2012-01-23 LAB — CBC WITH DIFFERENTIAL/PLATELET
Basophils Absolute: 0 10*3/uL (ref 0.0–0.1)
Eosinophils Absolute: 0.2 10*3/uL (ref 0.0–0.5)
HGB: 15.6 g/dL (ref 13.0–17.1)
MCV: 88.1 fL (ref 79.3–98.0)
MONO#: 0.6 10*3/uL (ref 0.1–0.9)
NEUT#: 5.5 10*3/uL (ref 1.5–6.5)
RDW: 12.4 % (ref 11.0–14.6)
WBC: 7.9 10*3/uL (ref 4.0–10.3)
lymph#: 1.5 10*3/uL (ref 0.9–3.3)

## 2012-01-23 LAB — CMP (CANCER CENTER ONLY)
Albumin: 3.7 g/dL (ref 3.3–5.5)
CO2: 29 mEq/L (ref 18–33)
Glucose, Bld: 155 mg/dL — ABNORMAL HIGH (ref 73–118)
Potassium: 4.5 mEq/L (ref 3.3–4.7)
Sodium: 141 mEq/L (ref 128–145)
Total Protein: 7 g/dL (ref 6.4–8.1)

## 2012-01-23 IMAGING — CT CT CHEST W/ CM
2 of 5 series · 16 of 46 positions shown, 18 images · IV contrast (omnipaque)
Comparison: Multiple priors, most recently chest c t a (P study)
dated [DATE], and CT of the chest abdomen and pelvis dated
[DATE].

CT CHEST

CLINICAL DATA: History of lymphoma.  Restaging scan.

CT CHEST, ABDOMEN AND PELVIS WITH CONTRAST
TECHNIQUE: Multidetector CT imaging of the chest, abdomen and
pelvis was performed following the standard protocol during bolus
administration of intravenous contrast.
Contrast: 100mL OMNIPAQUE IOHEXOL 300 MG/ML IV SOLN

[Series 2: cap with st · axial · 0.87mm/px · z∈[-614,-68]mm · 13 of 125 slices shown, 15 images]
[im 8/125  soft-tissue]
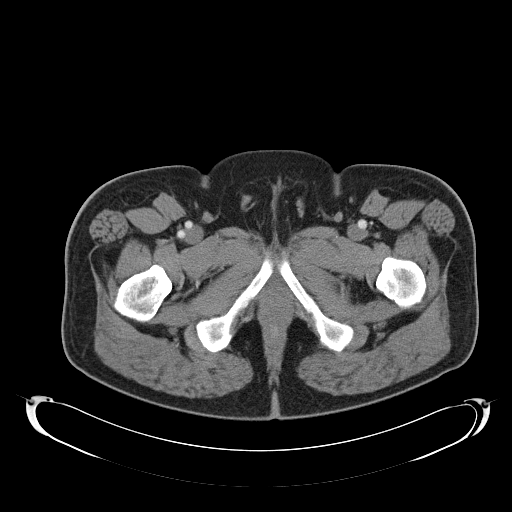
[im 8/125  bone]
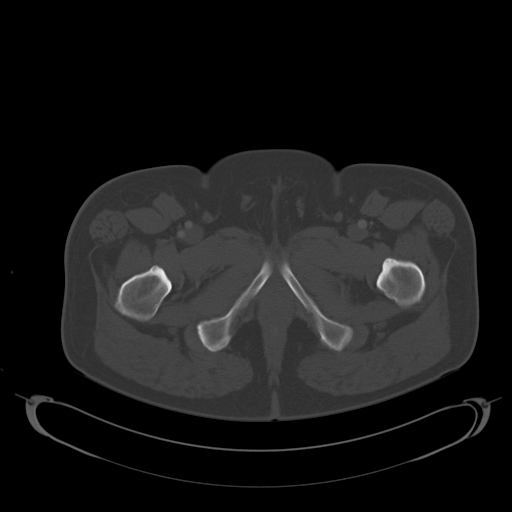
[im 15/125  soft-tissue]
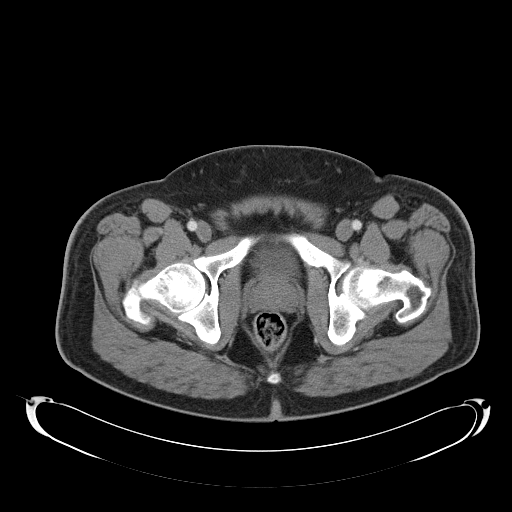
[im 30/125  soft-tissue]
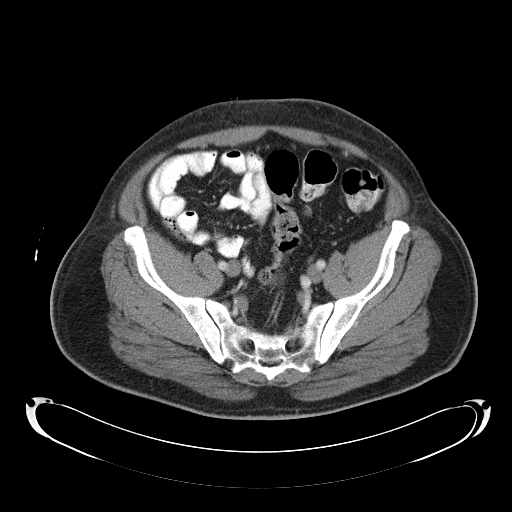
[im 37/125  soft-tissue]
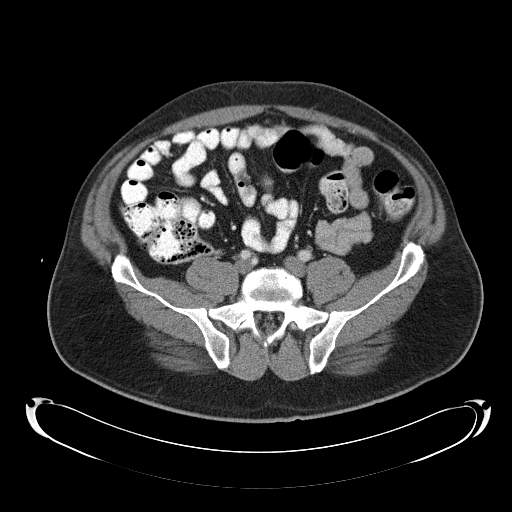
[im 44/125  soft-tissue]
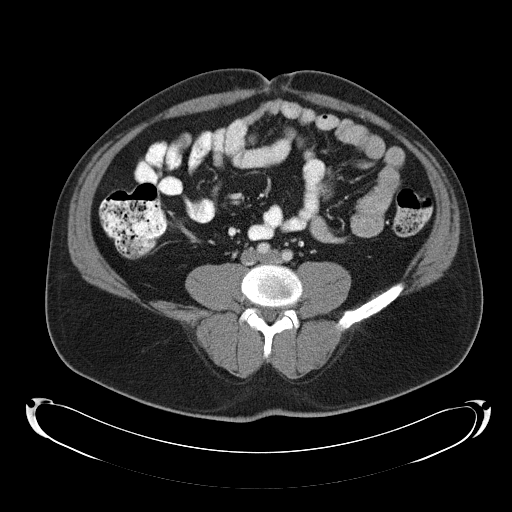
[im 52/125  soft-tissue]
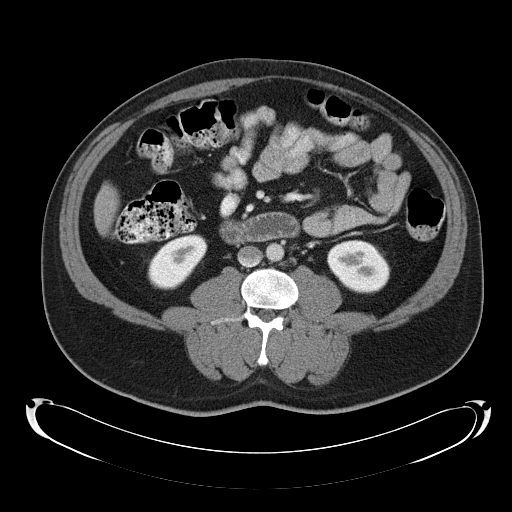
[im 66/125  soft-tissue]
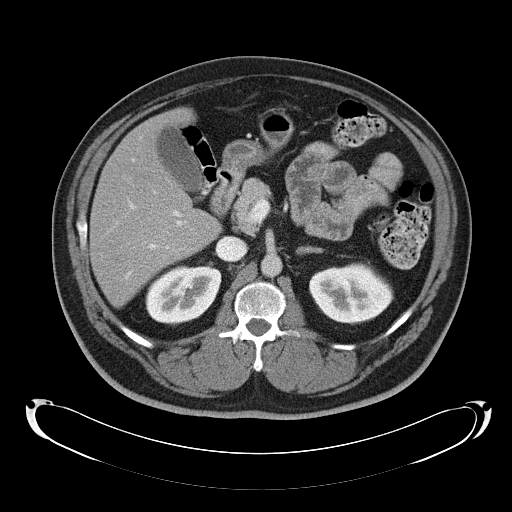
[im 73/125  soft-tissue]
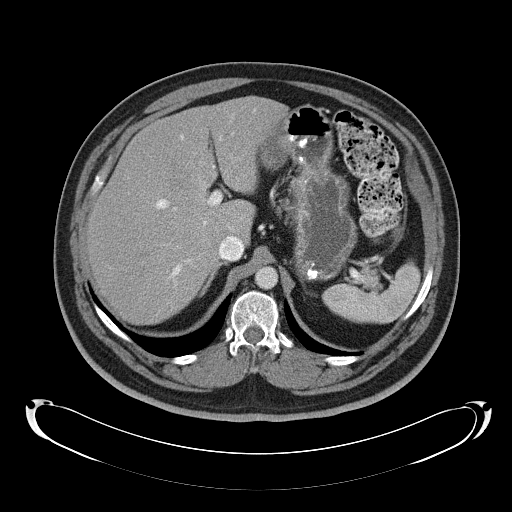
[im 81/125  soft-tissue]
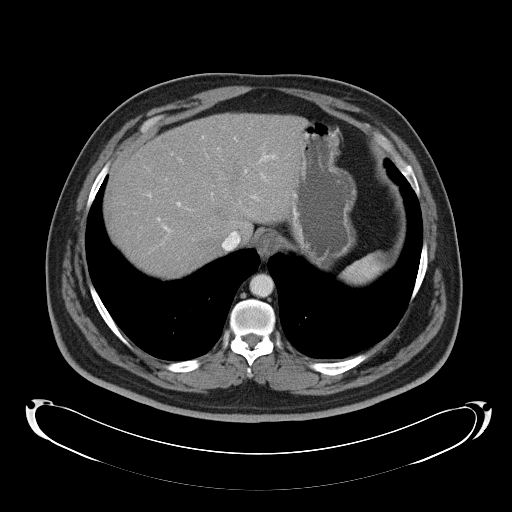
[im 81/125  bone]
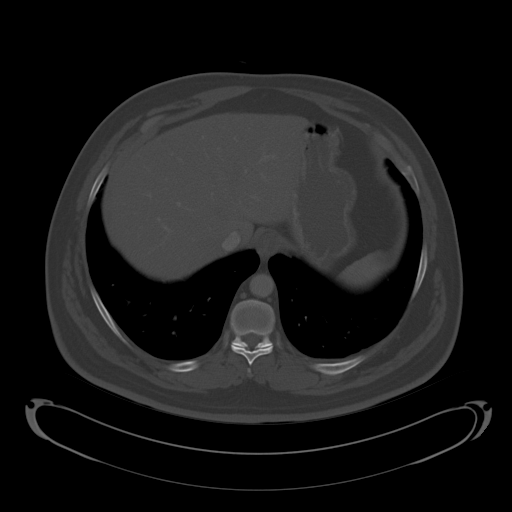
[im 88/125  soft-tissue]
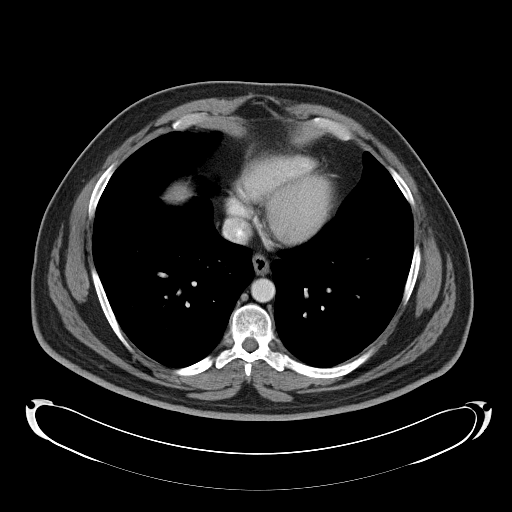
[im 95/125  soft-tissue]
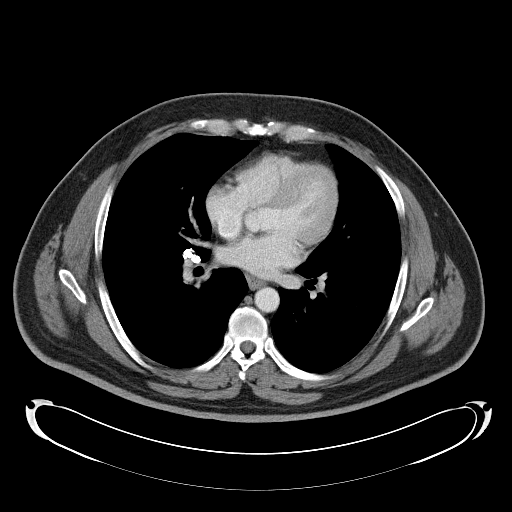
[im 110/125  soft-tissue]
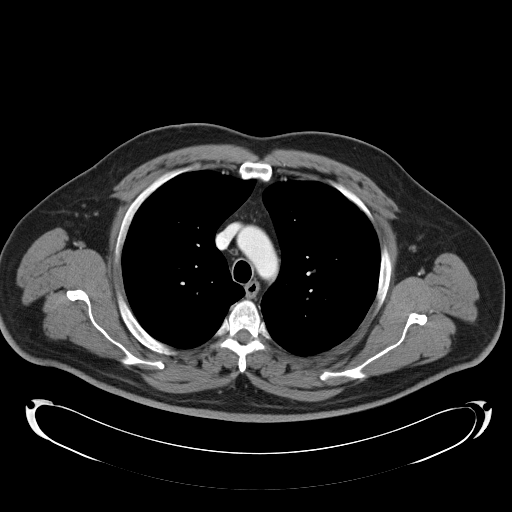
[im 117/125  soft-tissue]
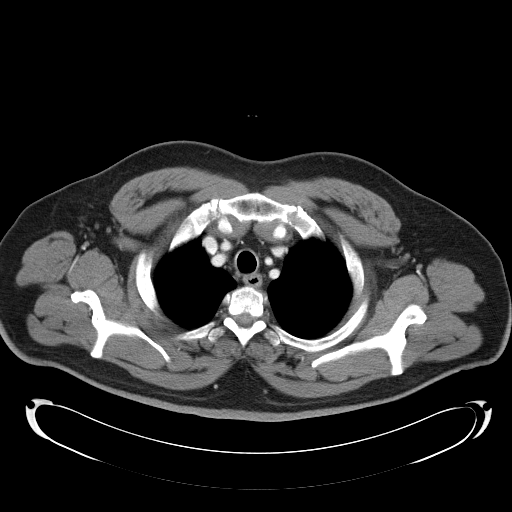

[Series 602: <mpr thick range> · coronal · 1.22mm/px · 3 of 101 slices shown]
[im 34/101  soft-tissue]
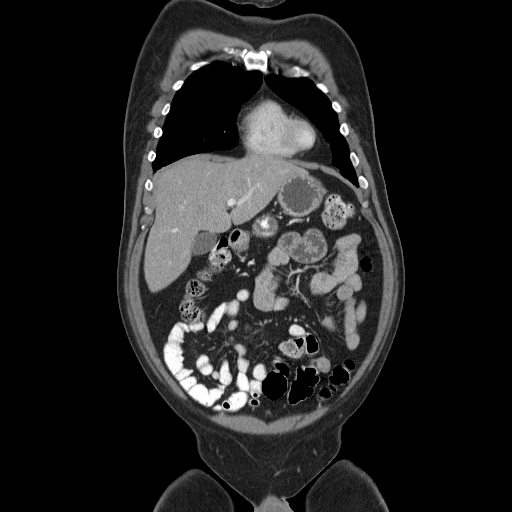
[im 45/101  soft-tissue]
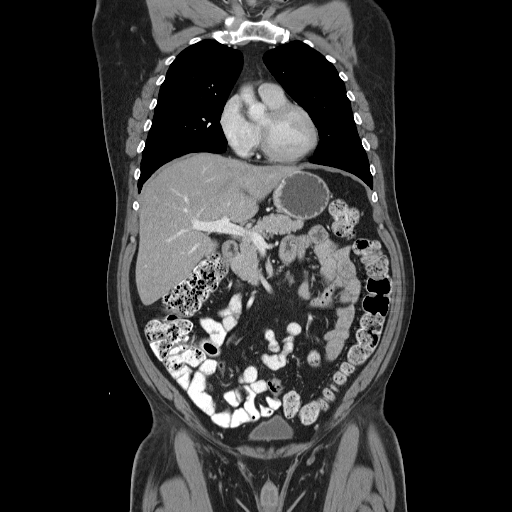
[im 56/101  soft-tissue]
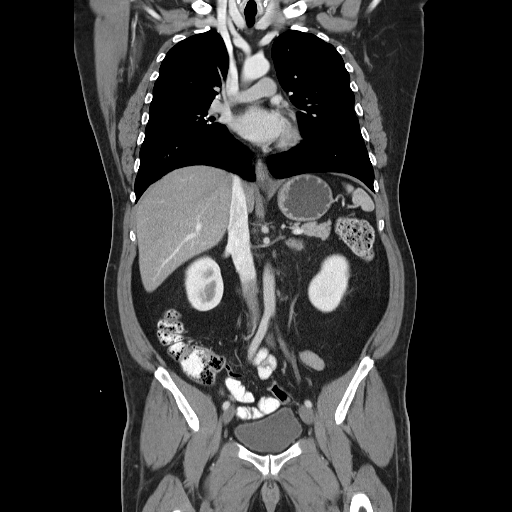

[16 of 46 positions shown; findings below may reference images not displayed]

FINDINGS: Mediastinum: Heart size is normal. There is no significant
pericardial fluid, thickening or pericardial calcification. No
pathologically enlarged mediastinal or hilar lymph nodes. Calcified
right hilar nodes are again noted.  Esophagus is unremarkable in
appearance.

Lungs/Pleura: Calcified granuloma in the lateral segment of the
right middle lobe. Tiny nodules in the left major fissure are
similar to multiple prior examinations, most consistent with
subpleural lymph nodes. There are otherwise no new or enlarging
suspicious appearing pulmonary nodules or masses identified on
today's CT examination. No consolidative airspace disease.  No
pleural effusions.

Musculoskeletal: There are no aggressive appearing lytic or blastic
lesions noted in the visualized portions of the skeleton.
IMPRESSION: 1.  No findings to suggest disease recurrence within the thorax.
The appearance of the chest is essentially unchanged, as detailed
above.

CT ABDOMEN AND PELVIS
FINDINGS: Abdomen/Pelvis: The enhanced appearance of the liver, gallbladder,
pancreas, spleen, bilateral adrenal glands and bilateral kidneys is
unremarkable.  There is no ascites or pneumoperitoneum and no
pathologic distension of bowel.  No definite pathologic
lymphadenopathy within the abdomen or pelvis.  Appendix is normal.
Urinary bladder is unremarkable in appearance.

Musculoskeletal: There are no aggressive appearing lytic or blastic
lesions noted in the visualized portions of the skeleton.
IMPRESSION: 1.  No findings to suggest disease recurrence within the abdomen or
pelvis.

## 2012-01-23 MED ORDER — IOHEXOL 300 MG/ML  SOLN
100.0000 mL | Freq: Once | INTRAMUSCULAR | Status: AC | PRN
  Administered 2012-01-23: 100 mL via INTRAVENOUS

## 2012-01-27 ENCOUNTER — Ambulatory Visit (HOSPITAL_BASED_OUTPATIENT_CLINIC_OR_DEPARTMENT_OTHER): Payer: Medicaid Other | Admitting: Internal Medicine

## 2012-01-27 ENCOUNTER — Ambulatory Visit: Payer: Medicaid Other

## 2012-01-27 ENCOUNTER — Telehealth: Payer: Self-pay | Admitting: Internal Medicine

## 2012-01-27 VITALS — BP 132/84 | HR 100 | Temp 97.4°F | Ht 65.0 in | Wt 197.0 lb

## 2012-01-27 DIAGNOSIS — C8589 Other specified types of non-Hodgkin lymphoma, extranodal and solid organ sites: Secondary | ICD-10-CM

## 2012-01-27 NOTE — Progress Notes (Signed)
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 2) Maintenance Rituxan at 375 mg per meter square given every 2 months status post 12 cycles  CURRENT THERAPY: Observation.  INTERVAL HISTORY: Blake Vaughn 51 y.o. male returns to the clinic today for followup visit. The patient is feeling fine with no specific complaints. He denied having any significant weight loss or night sweats. He has no palpable lymphadenopathy. No chest pain or shortness of breath, no cough or hemoptysis. He tolerated the last 3 cycles of his maintenance chemotherapy with Rituxan fairly well. The patient has repeat CT scan of the chest, abdomen and pelvis performed recently and he is here today for evaluation and discussion of his scan results  MEDICAL HISTORY: Past Medical History  Diagnosis Date  . Hypertension 11/27/2011  . Diabetes mellitus   . Hypercholesterolemia   . Lymphoma     No longer on chemo  . Lymphoma     ALLERGIES:  is allergic to iohexol.  MEDICATIONS:  Current Outpatient Prescriptions  Medication Sig Dispense Refill  . glipiZIDE (GLUCOTROL) 10 MG tablet Take 1 tablet (10 mg total) by mouth 2 (two) times daily before a meal.  60 tablet  6  . metFORMIN (GLUCOPHAGE) 1000 MG tablet Take 1 tablet (1,000 mg total) by mouth 2 (two) times daily with a meal.  60 tablet  6  . aspirin 81 MG tablet Take 81 mg by mouth daily.        . benazepril (LOTENSIN) 5 MG tablet Take 1 tablet (5 mg total) by mouth daily.  30 tablet  0  . pravastatin (PRAVACHOL) 40 MG tablet Take 1 tablet (40 mg total) by mouth at bedtime.  30 tablet  0    REVIEW OF SYSTEMS:  A comprehensive review of systems was negative.    PHYSICAL EXAMINATION: General appearance: alert, cooperative and no distress Head: Normocephalic, without obvious abnormality, atraumatic Neck: no adenopathy Lymph nodes: Cervical, supraclavicular, and axillary nodes normal. Resp: clear to auscultation bilaterally Cardio: regular rate and rhythm, S1, S2 normal, no murmur, click, rub or gallop GI: soft, non-tender; bowel sounds normal; no masses,  no organomegaly Extremities: extremities normal, atraumatic, no cyanosis or edema Neurologic: Alert and oriented X 3, normal strength and tone. Normal symmetric reflexes. Normal coordination and gait  ECOG PERFORMANCE STATUS: 0 - Asymptomatic  Blood pressure 132/84, pulse 100, temperature 97.4 F (36.3 C), temperature source Oral, height 5\' 5"  (1.651 m), weight 197 lb (89.359 kg).  LABORATORY DATA: Lab Results  Component Value Date   WBC 7.9 01/23/2012   HGB 15.6 01/23/2012   HCT 44.8 01/23/2012   MCV 88.1 01/23/2012   PLT 209 01/23/2012      Chemistry      Component Value Date/Time   NA 141 01/23/2012 0745   NA 136 01/09/2012 2215   K 4.5 01/23/2012 0745   K 3.9 01/09/2012 2215   CL 97* 01/23/2012 0745   CL 102 01/09/2012 2215   CO2 29 01/23/2012 0745   CO2 26 01/09/2012 2215   BUN 11 01/23/2012 0745   BUN 8 01/09/2012 2215   CREATININE 0.7 01/23/2012 0745   CREATININE 0.59 01/09/2012 2215   GLU 206* 02/06/2009 8119  Component Value Date/Time   CALCIUM 9.3 01/23/2012 0745   CALCIUM 9.8 01/09/2012 2215   ALKPHOS 64 01/23/2012 0745   ALKPHOS 67 11/26/2011 0925   AST 20 01/23/2012 0745   AST 18 11/26/2011 0925   ALT 18 11/26/2011 0925   BILITOT 0.70 01/23/2012 0745   BILITOT 0.4 11/26/2011 0925       RADIOGRAPHIC STUDIES: Dg Chest 2 View  01/09/2012  *RADIOLOGY REPORT*  Clinical Data: Chest pain, diabetes  CHEST - 2 VIEW  Comparison: 07/24/2011 CT  Findings: Calcified granuloma right middle lobe.  Calcified right hilar lymph nodes.  Lungs are otherwise clear. No pleural effusion or  pneumothorax. The cardiomediastinal contours are within normal limits. The visualized bones and soft tissues are without significant appreciable abnormality.  IMPRESSION: Sequelae of prior granulomas infection.  No acute process identified.  Original Report Authenticated By: Waneta Martins, M.D.   Ct Chest W Contrast  01/23/2012  *RADIOLOGY REPORT*  Clinical Data:  History of lymphoma.  Restaging scan.  CT CHEST, ABDOMEN AND PELVIS WITH CONTRAST  Technique:  Multidetector CT imaging of the chest, abdomen and pelvis was performed following the standard protocol during bolus administration of intravenous contrast.  Contrast: OMNIPAQUE IOHEXOL 300 MG/ML IV SOLN  Comparison:  Multiple priors, most recently chest c t a (P study) dated 01/10/2012, and CT of the chest abdomen and pelvis dated 07/24/2011.  CT CHEST  Findings:  Mediastinum: Heart size is normal. There is no significant pericardial fluid, thickening or pericardial calcification. No pathologically enlarged mediastinal or hilar lymph nodes. Calcified right hilar nodes are again noted.  Esophagus is unremarkable in appearance.  Lungs/Pleura: Calcified granuloma in the lateral segment of the right middle lobe. Tiny nodules in the left major fissure are similar to multiple prior examinations, most consistent with subpleural lymph nodes. There are otherwise no new or enlarging suspicious appearing pulmonary nodules or masses identified on today's CT examination. No consolidative airspace disease.  No pleural effusions.  Musculoskeletal: There are no aggressive appearing lytic or blastic lesions noted in the visualized portions of the skeleton.  IMPRESSION:  1.  No findings to suggest disease recurrence within the thorax. The appearance of the chest is essentially unchanged, as detailed above.  CT ABDOMEN AND PELVIS  Findings:  Abdomen/Pelvis: The enhanced appearance of the liver, gallbladder, pancreas, spleen, bilateral adrenal glands and bilateral  kidneys is unremarkable.  There is no ascites or pneumoperitoneum and no pathologic distension of bowel.  No definite pathologic lymphadenopathy within the abdomen or pelvis.  Appendix is normal. Urinary bladder is unremarkable in appearance.  Musculoskeletal: There are no aggressive appearing lytic or blastic lesions noted in the visualized portions of the skeleton.  IMPRESSION:  1.  No findings to suggest disease recurrence within the abdomen or pelvis.  Original Report Authenticated By: Florencia Reasons, M.D.   Ct Angio Chest W/cm &/or Wo Cm  01/10/2012  *RADIOLOGY REPORT*  Clinical Data: Palpitations  CT ANGIOGRAPHY CHEST  Technique:  Multidetector CT imaging of the chest using the standard protocol during bolus administration of intravenous contrast. Multiplanar reconstructed images including MIPs were obtained and reviewed to evaluate the vascular anatomy.  Contrast: OMNIPAQUE IOHEXOL 350 MG/ML IV SOLN  Comparison: 01/09/2012 radiograph  Findings: Pulmonary arterial branch vessels are patent.  Aorta is of normal caliber.  Normal heart size.  No intrathoracic lymphadenopathy. Calcified right hilar lymph nodes.  Unremarkable thyroid gland.  Limited images through the upper abdomen show no acute abnormality.  Central airways  are patent.  Calcified nodule right middle lobe in keeping with prior granulomas infection.  Otherwise, no focal nodules or consolidation.  No pneumothorax.  No acute osseous abnormality.  IMPRESSION: No pulmonary embolism or acute intrathoracic process.  Original Report Authenticated By: Waneta Martins, M.D.   Ct Abdomen Pelvis W Contrast  01/23/2012  *RADIOLOGY REPORT*  Clinical Data:  History of lymphoma.  Restaging scan.  CT CHEST, ABDOMEN AND PELVIS WITH CONTRAST  Technique:  Multidetector CT imaging of the chest, abdomen and pelvis was performed following the standard protocol during bolus administration of intravenous contrast.  Contrast: OMNIPAQUE IOHEXOL 300  MG/ML IV SOLN  Comparison:  Multiple priors, most recently chest c t a (P study) dated 01/10/2012, and CT of the chest abdomen and pelvis dated 07/24/2011.  CT CHEST  Findings:  Mediastinum: Heart size is normal. There is no significant pericardial fluid, thickening or pericardial calcification. No pathologically enlarged mediastinal or hilar lymph nodes. Calcified right hilar nodes are again noted.  Esophagus is unremarkable in appearance.  Lungs/Pleura: Calcified granuloma in the lateral segment of the right middle lobe. Tiny nodules in the left major fissure are similar to multiple prior examinations, most consistent with subpleural lymph nodes. There are otherwise no new or enlarging suspicious appearing pulmonary nodules or masses identified on today's CT examination. No consolidative airspace disease.  No pleural effusions.  Musculoskeletal: There are no aggressive appearing lytic or blastic lesions noted in the visualized portions of the skeleton.  IMPRESSION:  1.  No findings to suggest disease recurrence within the thorax. The appearance of the chest is essentially unchanged, as detailed above.  CT ABDOMEN AND PELVIS  Findings:  Abdomen/Pelvis: The enhanced appearance of the liver, gallbladder, pancreas, spleen, bilateral adrenal glands and bilateral kidneys is unremarkable.  There is no ascites or pneumoperitoneum and no pathologic distension of bowel.  No definite pathologic lymphadenopathy within the abdomen or pelvis.  Appendix is normal. Urinary bladder is unremarkable in appearance.  Musculoskeletal: There are no aggressive appearing lytic or blastic lesions noted in the visualized portions of the skeleton.  IMPRESSION:  1.  No findings to suggest disease recurrence within the abdomen or pelvis.  Original Report Authenticated By: Florencia Reasons, M.D.    ASSESSMENT: This is a very pleasant 51 years old Hispanic male with history of follicular lymphoma diagnosed in December of 2009, status post 6  cycles of systemic chemotherapy with CHOP/Rituxan followed by 2 years of maintenance Rituxan. The patient is doing fine and he has no evidence for disease recurrence.   PLAN: I discussed the scan results with the patient today and recommended for him continuous observation with repeat CT scan of the chest, abdomen and pelvis in one year and the patient will come back for followup visit at that time. He was advised to call me immediately if he has any concerning symptoms in the interval.   All questions were answered. The patient knows to call the clinic with any problems, questions or concerns. We can certainly see the patient much sooner if necessary.

## 2012-01-27 NOTE — Telephone Encounter (Signed)
appt made and printed for 2/19 and 2/20 2014,pt will p/u contrast closer to time   aom

## 2012-02-15 ENCOUNTER — Encounter (HOSPITAL_COMMUNITY): Payer: Self-pay | Admitting: *Deleted

## 2012-02-15 ENCOUNTER — Emergency Department (HOSPITAL_COMMUNITY)
Admission: EM | Admit: 2012-02-15 | Discharge: 2012-02-15 | Disposition: A | Discharge status: Home / Self Care | Payer: Medicaid Other | Source: Home / Self Care | Attending: Emergency Medicine | Admitting: Emergency Medicine

## 2012-02-15 DIAGNOSIS — J329 Chronic sinusitis, unspecified: Secondary | ICD-10-CM

## 2012-02-15 DIAGNOSIS — J40 Bronchitis, not specified as acute or chronic: Secondary | ICD-10-CM

## 2012-02-15 LAB — POCT RAPID STREP A: Streptococcus, Group A Screen (Direct): NEGATIVE

## 2012-02-15 MED ORDER — BENZONATATE 200 MG PO CAPS: 200.0000 mg | ORAL_CAPSULE | Freq: Three times a day (TID) | ORAL | Status: AC | PRN

## 2012-02-15 MED ORDER — AMOXICILLIN 500 MG PO CAPS: 1000.0000 mg | ORAL_CAPSULE | Freq: Three times a day (TID) | ORAL | Status: AC

## 2012-02-15 NOTE — ED Provider Notes (Signed)
Chief Complaint  Patient presents with  . Headache  . Nasal Congestion  . Conjunctivitis    History of Present Illness:   The patient is a 51 year old male who's had a two-day history of cough productive of small amounts of yellow sputum, nasal congestion, headache, redness of the eyes, and sore throat. He has not had any fever. His medical history is positive for hypertension, diabetes, hypercholesterolemia, and lymphoma. He is status post chemotherapy but is no longer taking chemotherapy right now.  Review of Systems:  Other than noted above, the patient denies any of the following symptoms. Systemic:  No fever, chills, sweats, fatigue, myalgias, headache, or anorexia. Eye:  No redness, pain or drainage. ENT:  No earache, nasal congestion, rhinorrhea, sinus pressure, or sore throat. Lungs:  No cough, sputum production, wheezing, shortness of breath. Or chest pain. GI:  No nausea, vomiting, abdominal pain or diarrhea. Skin:  No rash or itching.  PMFSH:  Past medical history, family history, social history, meds, and allergies were reviewed.  Physical Exam:   Vital signs:  BP 125/71  Pulse 104  Temp(Src) 98.1 F (36.7 C) (Oral)  Resp 20  SpO2 96% General:  Alert, in no distress. Eye:  He had bilateral conjunctival injection, no drainage, corneas and anterior chambers were normal. ENT:  TMs and canals were normal, without erythema or inflammation.  Nasal mucosa was clear and uncongested, without drainage.  Mucous membranes were moist.  Tonsils were enlarged and red without exudate.  There were no oral ulcerations or lesions. Neck:  Supple, no adenopathy, tenderness or mass. Lungs:  No respiratory distress.  Lungs were clear to auscultation, without wheezes, rales or rhonchi.  Breath sounds were clear and equal bilaterally. Heart:  Regular rhythm, without gallops, murmers or rubs. Skin:  Clear, warm, and dry, without rash or lesions.  Labs:   Results for orders placed during the  hospital encounter of 02/15/12  POCT RAPID STREP A ([facility] CARE ONLY)      Component Value Range   Streptococcus, Group A Screen (Direct) NEGATIVE  NEGATIVE     Assessment:   Diagnoses that have been ruled out:  None  Diagnoses that are still under consideration:  None  Final diagnoses:  Bronchitis  Sinusitis      Plan:   1.  The following meds were prescribed:   New Prescriptions   AMOXICILLIN (AMOXIL) 500 MG CAPSULE    Take 2 capsules (1,000 mg total) by mouth 3 (three) times daily.   BENZONATATE (TESSALON) 200 MG CAPSULE    Take 1 capsule (200 mg total) by mouth 3 (three) times daily as needed for cough.   2.  The patient was instructed in symptomatic care and handouts were given. 3.  The patient was told to return if becoming worse in any way, if no better in 3 or 4 days, and given some red flag symptoms that would indicate earlier return.   Reuben Likes, MD 02/15/12 [phone]

## 2012-02-15 NOTE — ED Notes (Signed)
Pt with onset of eye irritation /redness/headache and sinus congestion yesterday  Denies cough

## 2012-02-15 NOTE — Discharge Instructions (Signed)
Bronquitis (Bronchitis) La bronquitis es [place] enfermedad de los conductos que transportan el aire a los pulmones. Esto dificulta la entrada y salida del aire. El Scientist, research (medical) de los conductos puede causar mucha tos. No recibir el tratamiento adecuado puede causar una bronquitis de larga duracin (crnica). CUIDADOS EN EL HOGAR  Tome mucha agua para mantener la orina clara o de color amarillo claro.   Use un humidificador de vapor fro.   Si fuma, deje de fumar. Si sigue fumando, impedir la curacin de la bronquitis.   Tome los medicamentos segn las indicaciones de su mdico.  SOLICITE AYUDA DE INMEDIATO SI:  La tos lo despierta por las noches.   Respira con dificultad.   Usted o su nio se vuelve dbil o ms enfermo.   Presenta dificultades para respirar, tiene sibilancias o Company secretary.   Tose y escupe sangre.   La tos dura ms de 2 semanas.   Tiene fiebre.   Su beb tiene ms de 3 meses y su temperatura rectal es de 102 F (38.9 C) o ms.   Su beb tiene 3 meses o menos y su temperatura rectal es de 100.4 F (38 C) o ms.  ASEGURESE DE:  Comprende estas instrucciones.   Controlar su enfermedad.   Solicitar ayuda de inmediato si no mejora o empeora.  Document Released: 02/20/2009 Document Revised: 11/13/2011 [facility] Patient Information 2012 [place], [place].Sinusitis (Sinusitis) Los senos paranasales son bolsas de aire que se encuentran dentro de los huesos de la cara. La aparicin de bacterias en los senos paranasales lleva a una infeccin. Esta se denomina sinusitis.Estas infecciones generalmente son el resultado de una obstruccin en los orificios que [employer] senos.  SNTOMAS Generalmente, segn que seno se infecte, habr diferentes reas en las que Special educational needs teacher.   Los senos maxilares generalmente producen dolor detrs de los ojos.   La sinusitis frontal ocasiona dolor en el medio de la frente y [place] ojos.  Entre otros problemas  (sntomas) se incluyen:  Dolor en la zona posterior de los dientes superiores.   Una secrecin similar al pus (purulenta) proveniente de la nariz.   Toda inflamacin, calor o sensibilidad en estas mismas reas son indicios de infeccin.  TRATAMIENTO La sinusitis se diagnostica a travs del examen fsico y radiografas. Si le han tomado radiografas, asegrese de [employer]. O consulte el modo en que podr obtenerlos. Su mdico le prescribir medicamentos (antibiticos). Estos medicamentos se indican para combatir la infeccin. Tambin le prescribir un descongestivo para reducir la inflamacin del seno paranasal.  INSTRUCCIONES PARA EL CUIDADO DOMICILIARIO  Utilice los medicamentos de venta libre o de prescripcin para Chief Technology Officer, el malestar o la [place], segn se lo indique el profesional que lo asiste.   Beba gran cantidad de lquidos. Los lquidos ayudan a que las mucosas de los senos nasales drenen ms fcilmente.   Aplique bolsas de calor hmedo o hielo en las zonas ms doloridas para [employer].   Utilice Sprays nasales salinos para ayudar a humedecer los senos nasales. Estos pueden encontrarse en la farmacia local.  SOLICITE ATENCIN MDICA DE INMEDIATO SI:  Lance Muss.   Dolor en aumento, dolor de cabeza intenso o dolor de dientes.   Nuseas, vmitos o sudoracin.   Hinchazn infrecuente alrededor del rostro o problemas en la visin.  EST SEGURO QUE:   Comprende las instrucciones para el alta mdica.   Controlar su enfermedad.   Solicitar atencin mdica de inmediato segn las indicaciones.  Document Released: 09/03/2005 Document Revised: 11/13/2011 [facility] Patient Information 2012 [place], [place].

## 2012-02-21 ENCOUNTER — Emergency Department (HOSPITAL_COMMUNITY)
Admission: EM | Admit: 2012-02-21 | Discharge: 2012-02-21 | Disposition: A | Discharge status: Home / Self Care | Payer: Medicaid Other | Source: Home / Self Care | Attending: Emergency Medicine | Admitting: Emergency Medicine

## 2012-02-21 ENCOUNTER — Emergency Department (INDEPENDENT_AMBULATORY_CARE_PROVIDER_SITE_OTHER): Payer: Medicaid Other

## 2012-02-21 ENCOUNTER — Encounter (HOSPITAL_COMMUNITY): Payer: Self-pay | Admitting: Emergency Medicine

## 2012-02-21 DIAGNOSIS — J329 Chronic sinusitis, unspecified: Secondary | ICD-10-CM

## 2012-02-21 IMAGING — CR DG SINUSES COMPLETE 3+V
4 series · 4 of 4 positions shown · non-contrast
Comparison: None.

CLINICAL DATA: The sinus pain and pressure.  Sinus congestion.

PARANASAL SINUSES - COMPLETE 3 + VIEW

[view not recorded (1 of 4)]
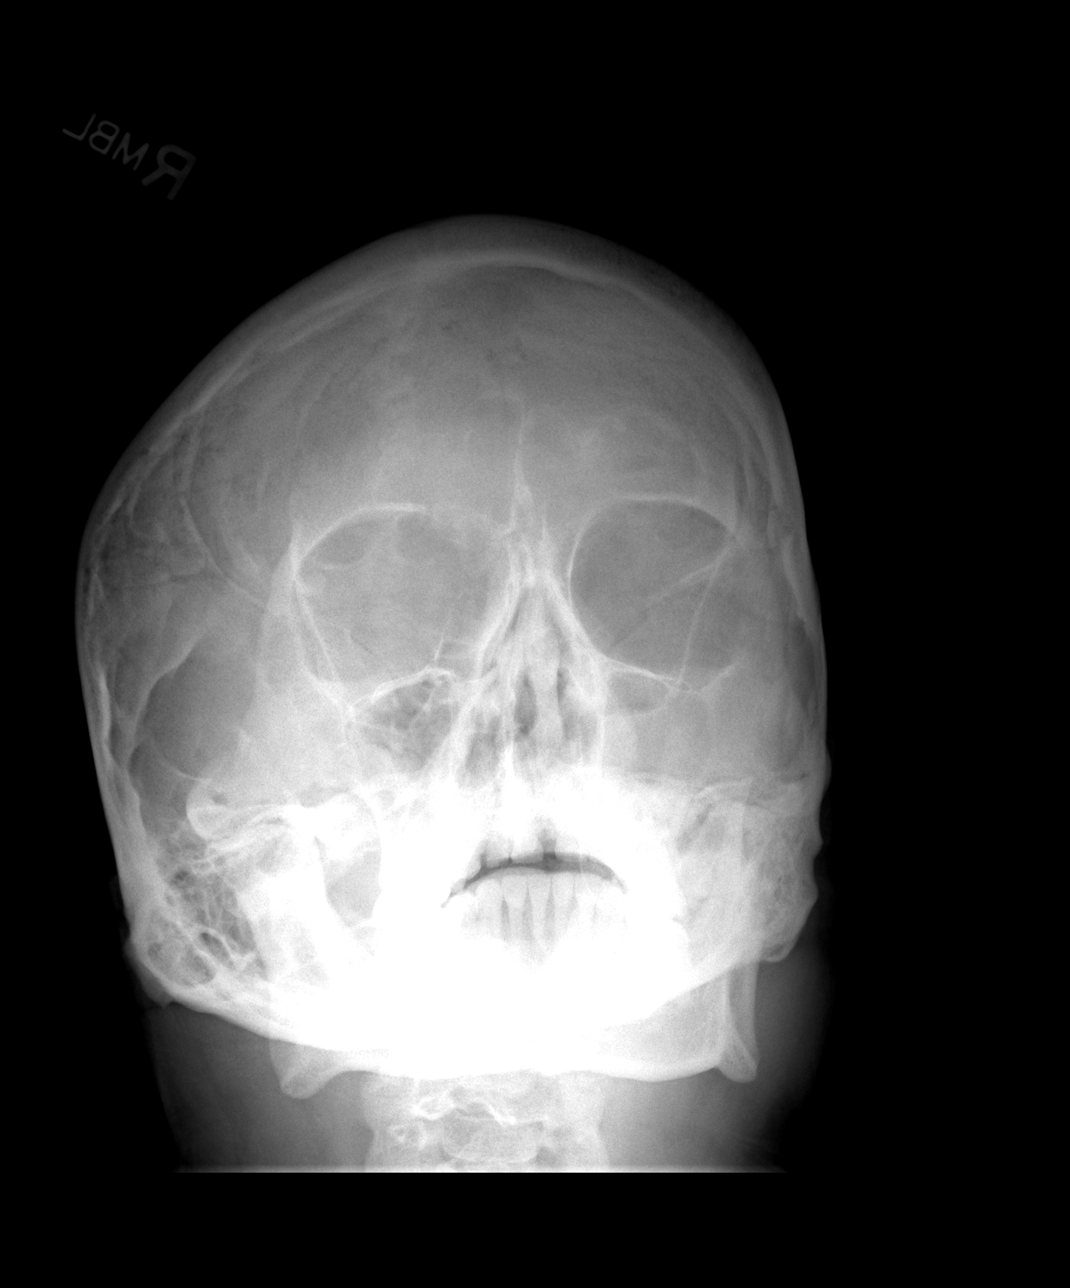

[view not recorded (2 of 4)]
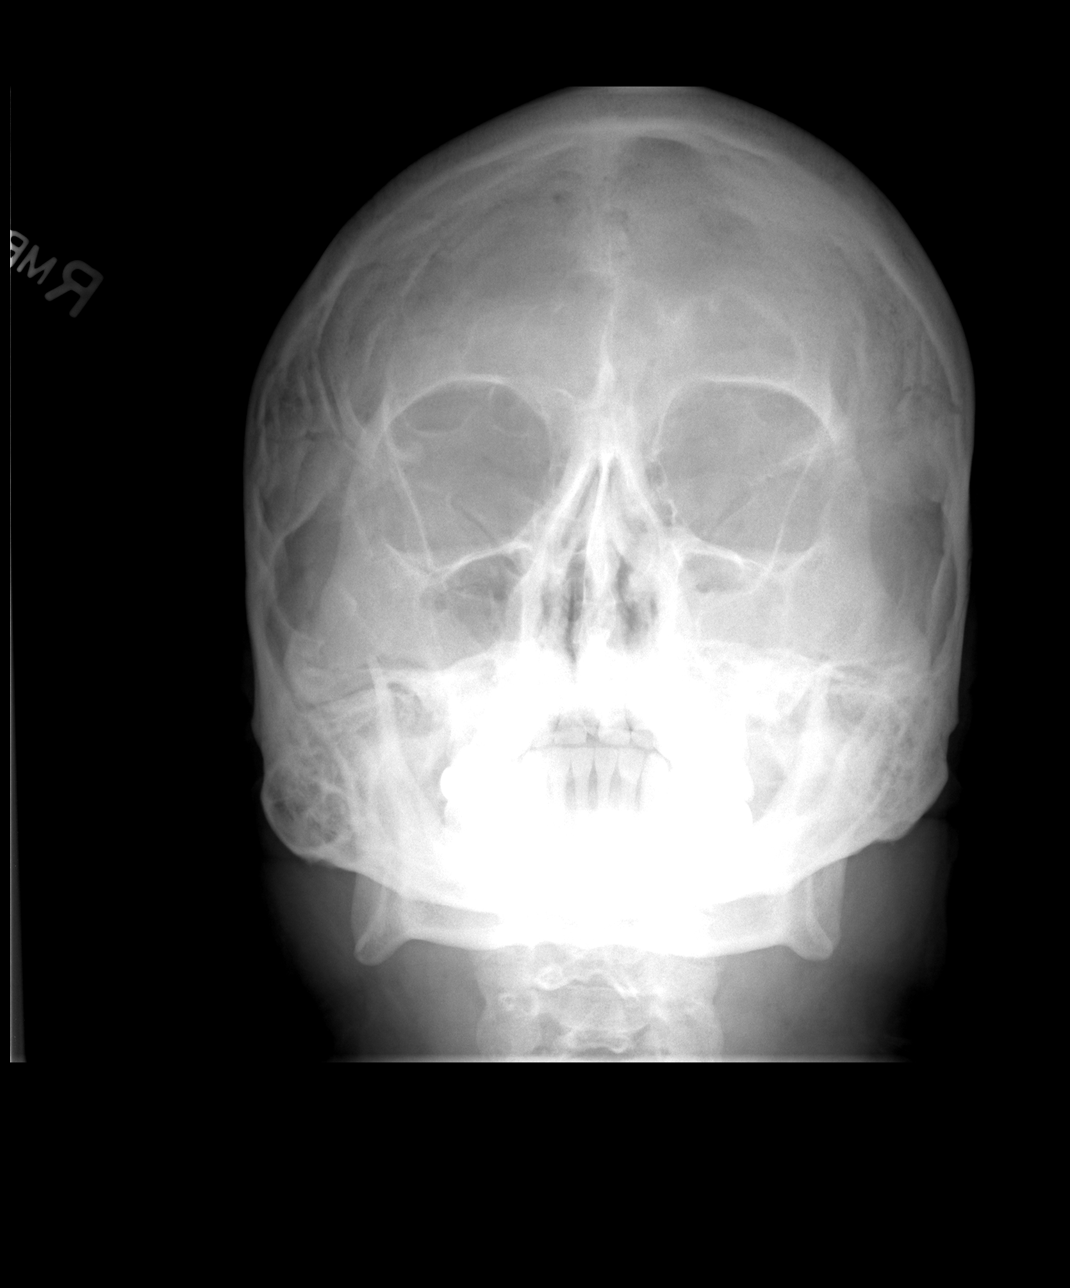

[view not recorded (3 of 4)]
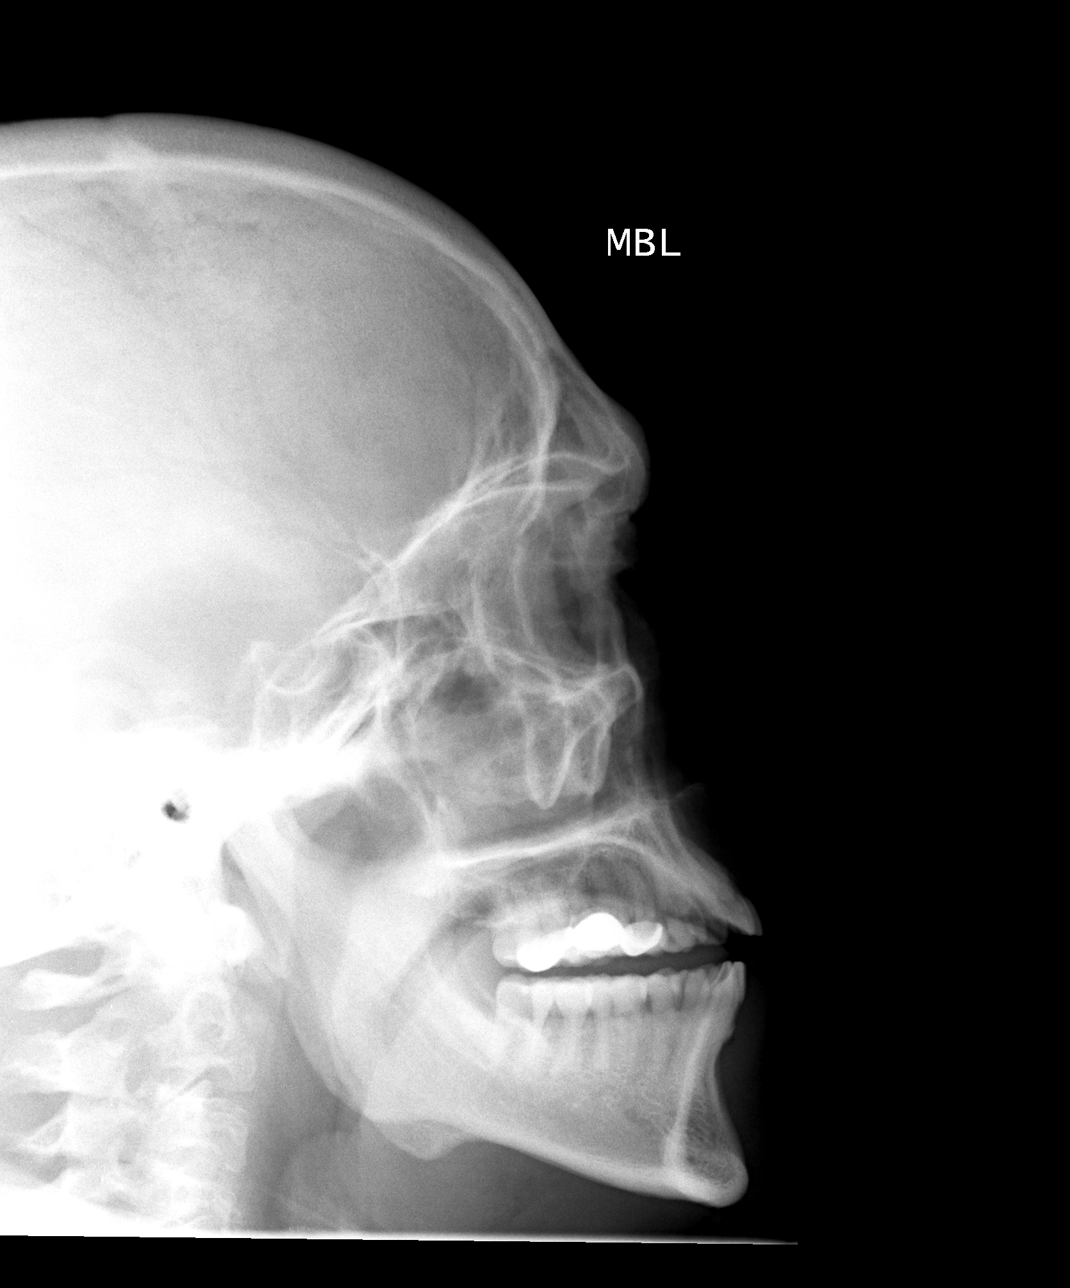

[view not recorded (4 of 4)]
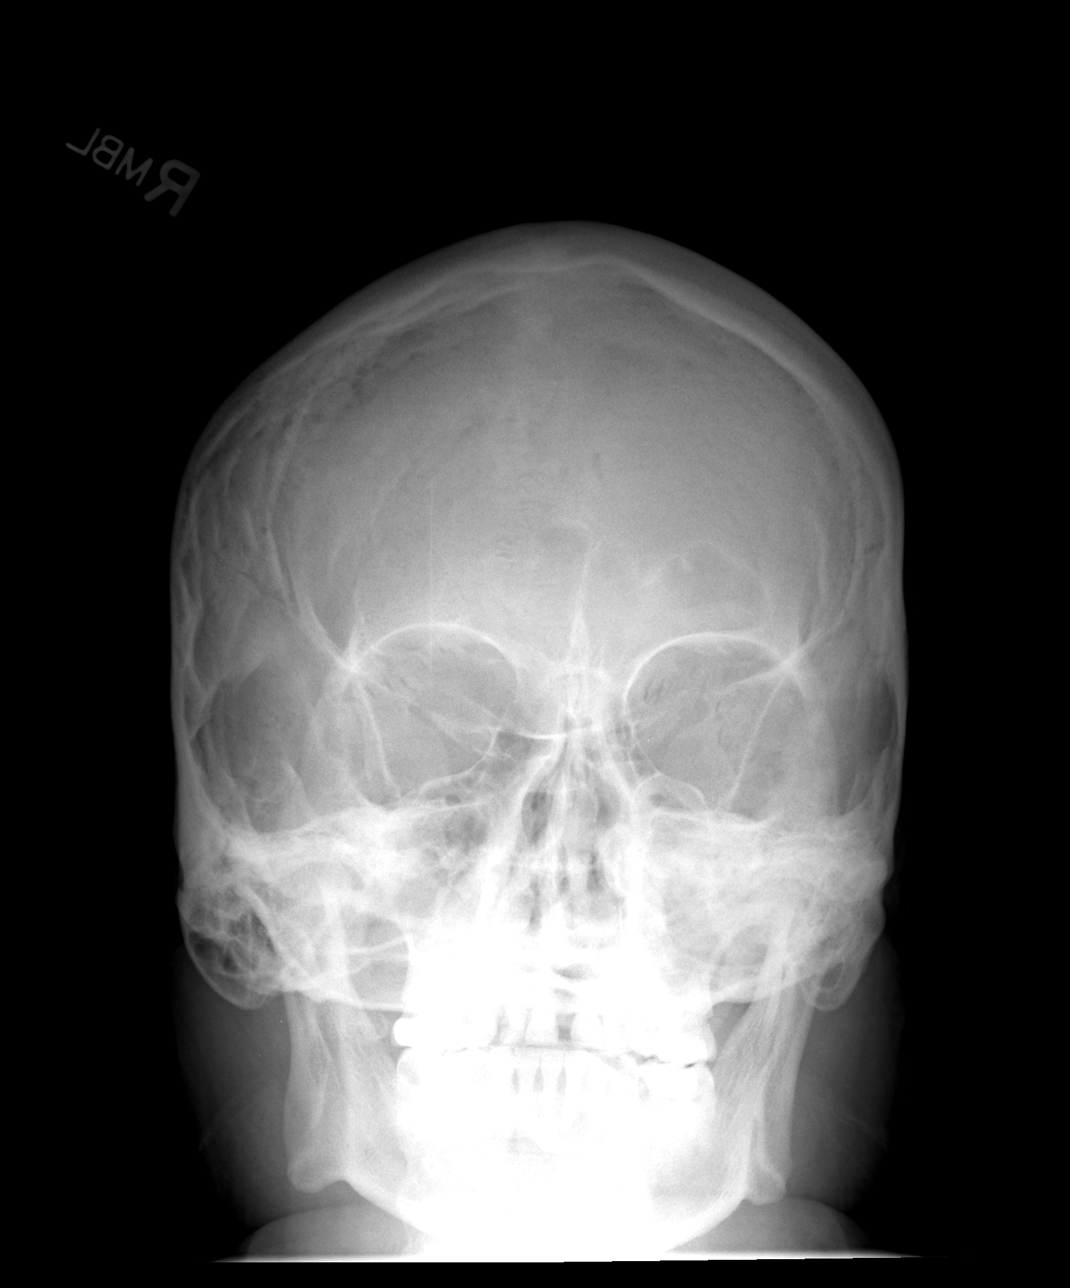

[4 of 4 positions shown; findings below may reference images not displayed]

FINDINGS: The left maxillary sinus and bilateral frontal sinuses
are opacified.  The ethmoid air cells are opacified.  There may be
some fluid in the mastoid air cells.  No focal osseous lesion is
evident.
IMPRESSION: 1.  Left maxillary and bilateral frontal sinus disease.
2.  Question and mastoid effusions.

## 2012-02-21 MED ORDER — AZITHROMYCIN 250 MG PO TABS: 250.0000 mg | ORAL_TABLET | Freq: Every day | ORAL | Status: AC

## 2012-02-21 MED ORDER — AZITHROMYCIN 250 MG PO TABS: 250.0000 mg | ORAL_TABLET | Freq: Every day | ORAL | Status: DC

## 2012-02-21 NOTE — ED Notes (Signed)
Headache onset 8 days ago, runny nose, stuffy head, denies chest congestion

## 2012-02-21 NOTE — Discharge Instructions (Signed)
Sinusitis (Sinusitis) Los senos paranasales son bolsas de aire que se encuentran dentro de los huesos de la cara. La aparicin de bacterias en los senos paranasales lleva a una infeccin. Esta se denomina sinusitis.Estas infecciones generalmente son el resultado de una obstruccin en los orificios que drenan los senos.  SNTOMAS Generalmente, segn que seno se infecte, habr diferentes reas en las que puede aparecer dolor.   Los senos maxilares generalmente producen dolor detrs de los ojos.   La sinusitis frontal ocasiona dolor en el medio de la frente y sobre los ojos.  Entre otros problemas (sntomas) se incluyen:  Dolor en la zona posterior de los dientes superiores.   Una secrecin similar al pus (purulenta) proveniente de la nariz.   Toda inflamacin, calor o sensibilidad en estas mismas reas son indicios de infeccin.  TRATAMIENTO La sinusitis se diagnostica a travs del examen fsico y radiografas. Si le han tomado radiografas, asegrese de retirar los resultados. O consulte el modo en que podr obtenerlos. Su mdico le prescribir medicamentos (antibiticos). Estos medicamentos se indican para combatir la infeccin. Tambin le prescribir un descongestivo para reducir la inflamacin del seno paranasal.  INSTRUCCIONES PARA EL CUIDADO DOMICILIARIO  Utilice los medicamentos de venta libre o de prescripcin para el dolor, el malestar o la fiebre, segn se lo indique el profesional que lo asiste.   Beba gran cantidad de lquidos. Los lquidos ayudan a que las mucosas de los senos nasales drenen ms fcilmente.   Aplique bolsas de calor hmedo o hielo en las zonas ms doloridas para aliviar las molestias.   Utilice Sprays nasales salinos para ayudar a humedecer los senos nasales. Estos pueden encontrarse en la farmacia local.  SOLICITE ATENCIN MDICA DE INMEDIATO SI:  Tiene fiebre.   Dolor en aumento, dolor de cabeza intenso o dolor de dientes.   Nuseas, vmitos o  sudoracin.   Hinchazn infrecuente alrededor del rostro o problemas en la visin.  EST SEGURO QUE:   Comprende las instrucciones para el alta mdica.   Controlar su enfermedad.   Solicitar atencin mdica de inmediato segn las indicaciones.  Document Released: 09/03/2005 Document Revised: 11/13/2011 [employer] Patient Information 2012 [employer]. 

## 2012-02-21 NOTE — ED Provider Notes (Signed)
History     CSN: [id number]  Arrival date & time 02/21/12  1525   First MD Initiated Contact with Patient 02/21/12 1533      Chief Complaint  Patient presents with  . Headache    (Consider location/radiation/quality/duration/timing/severity/associated sxs/prior treatment) HPI Comments: Patient returns 6 days after his previous visit complaining of an ongoing headache (patient points predominantly to the frontal region. He continued to be congested with a runny nose and a mild sore throat, my eyes are sticky together in the morning with discharge and redness.  Still coughing some with some phlegm. Headache has not gone away. Did take 2-3 pills of the peers with prescribed medicine (shows me almost full bottle of amoxicillin) I stopped taking them because they were not doing anything out of the second and third dose"  Patient is a 51 y.o. male presenting with headaches. The history is provided by the patient.  Headache The primary symptoms include headaches, loss of sensation and memory loss. Primary symptoms do not include loss of consciousness, altered mental status, seizures, dizziness, visual change, focal weakness, fever, nausea or vomiting. The symptoms began more than 1 week ago. The symptoms are unchanged.  The headache is not associated with aura, photophobia, visual change, neck stiffness or weakness.  Additional symptoms include pain. Additional symptoms do not include neck stiffness, weakness, photophobia, aura, hearing loss, tinnitus or vertigo.    Past Medical History  Diagnosis Date  . Hypertension 11/27/2011  . Diabetes mellitus   . Hypercholesterolemia   . Lymphoma     No longer on chemo  . Lymphoma     History reviewed. No pertinent past surgical history.  History reviewed. No pertinent family history.  History  Substance Use Topics  . Smoking status: Former Games developer  . Smokeless tobacco: Former Neurosurgeon    Quit date: 11/20/2006  . Alcohol Use: No       Review of Systems  Constitutional: Positive for chills and appetite change. Negative for fever.  HENT: Negative for hearing loss, neck pain, neck stiffness and tinnitus.   Eyes: Negative for photophobia.  Gastrointestinal: Negative for nausea and vomiting.  Neurological: Positive for headaches. Negative for dizziness, vertigo, tremors, focal weakness, seizures, loss of consciousness, syncope, facial asymmetry, speech difficulty, weakness, light-headedness and numbness.  Psychiatric/Behavioral: Positive for memory loss. Negative for altered mental status.    Allergies  Iohexol  Home Medications   Current Outpatient Rx  Name Route Sig Dispense Refill  . AMOXICILLIN 500 MG PO CAPS Oral Take 2 capsules (1,000 mg total) by mouth 3 (three) times daily. 60 capsule 0    Dispense as written.  . ASPIRIN 81 MG PO TABS Oral Take 81 mg by mouth daily.      Marland Kitchen BENAZEPRIL HCL 5 MG PO TABS Oral Take 1 tablet (5 mg total) by mouth daily. 30 tablet 0  . BENZONATATE 200 MG PO CAPS Oral Take 1 capsule (200 mg total) by mouth 3 (three) times daily as needed for cough. 30 capsule 0  . GLIPIZIDE 10 MG PO TABS Oral Take 1 tablet (10 mg total) by mouth 2 (two) times daily before a meal. 60 tablet 6  . METFORMIN HCL 1000 MG PO TABS Oral Take 1 tablet (1,000 mg total) by mouth 2 (two) times daily with a meal. 60 tablet 6  . OVER THE COUNTER MEDICATION  Tylenol/325mg    Guaifenesin 200mg   Dextromethorphan 200mg     . PRAVASTATIN SODIUM 40 MG PO TABS Oral Take 1 tablet (40  mg total) by mouth at bedtime. 30 tablet 0    BP 134/86  Pulse 94  Temp(Src) 99.5 F (37.5 C) (Oral)  Resp 25  SpO2 95%  Physical Exam  Nursing note and vitals reviewed. Constitutional: He is oriented to person, place, and time. He appears well-developed and well-nourished. No distress.  HENT:  Head: Normocephalic.  Mouth/Throat: Oropharyngeal exudate present.  Eyes: Pupils are equal, round, and reactive to light. No foreign  bodies found. Right eye exhibits no discharge. Left eye exhibits no discharge. Right conjunctiva is injected. Right conjunctiva has no hemorrhage. Left conjunctiva is injected. Left conjunctiva has no hemorrhage.  Neck: Neck supple. No JVD present.  Cardiovascular: Normal rate.   Pulmonary/Chest: Effort normal. No respiratory distress. He has no decreased breath sounds. He has no wheezes. He has no rhonchi. He has no rales.  Lymphadenopathy:    He has no cervical adenopathy.  Neurological: He is alert and oriented to person, place, and time. He has normal strength. He displays no atrophy and no tremor. No sensory deficit. He exhibits normal muscle tone. He displays no seizure activity. Coordination normal.  Skin: Skin is warm.    ED Course  Procedures (including critical care time)  Labs Reviewed - No data to display No results found.   No diagnosis found.    MDM  The patient describes symptoms and exam were mainly consistent with sinusitis. Instructed patient to complete and resume antibiotics cycle as he only took 2-3 doses as prescribed per his report. Patient has no other neurological symptoms and was advised and instructed about symptoms that were require further immediate emergent diagnostic workup in the emergency department. Patient understood and agree with plan of care        Jimmie Molly, MD 02/21/12 1715

## 2012-03-09 ENCOUNTER — Ambulatory Visit (INDEPENDENT_AMBULATORY_CARE_PROVIDER_SITE_OTHER): Payer: Medicaid Other | Admitting: Family Medicine

## 2012-03-09 ENCOUNTER — Encounter: Payer: Self-pay | Admitting: Family Medicine

## 2012-03-09 VITALS — BP 132/78 | HR 88 | Temp 98.2°F | Wt 196.0 lb

## 2012-03-09 DIAGNOSIS — J029 Acute pharyngitis, unspecified: Secondary | ICD-10-CM

## 2012-03-09 DIAGNOSIS — R51 Headache: Secondary | ICD-10-CM

## 2012-03-09 DIAGNOSIS — R070 Pain in throat: Secondary | ICD-10-CM | POA: Insufficient documentation

## 2012-03-09 MED ORDER — MAGIC MOUTHWASH W/LIDOCAINE: 2.0000 mL | Freq: Three times a day (TID) | ORAL | Status: DC | PRN

## 2012-03-09 MED ORDER — KETOROLAC TROMETHAMINE 60 MG/2ML IM SOLN: 60.0000 mg | Freq: Once | INTRAMUSCULAR | Status: DC

## 2012-03-09 MED ORDER — KETOROLAC TROMETHAMINE 60 MG/2ML IM SOLN
60.0000 mg | Freq: Once | INTRAMUSCULAR | Status: AC
  Administered 2012-03-09: 60 mg via INTRAMUSCULAR

## 2012-03-09 MED ORDER — IBUPROFEN 800 MG PO TABS: 800.0000 mg | ORAL_TABLET | Freq: Three times a day (TID) | ORAL | Status: AC | PRN

## 2012-03-09 MED ORDER — LORATADINE 10 MG PO TABS: 10.0000 mg | ORAL_TABLET | Freq: Every day | ORAL | Status: DC

## 2012-03-09 NOTE — Assessment & Plan Note (Signed)
Centaur criteria - low probability of strep infection.  No adenopathy, fever, or exudate on exam. Will treat symptoms with OTC cepacol cough drops and lidocaine wash. Follow up with PCP if symptoms worsen.

## 2012-03-09 NOTE — Assessment & Plan Note (Addendum)
Headache likely secondary to allergic rhinitis vs. Viral URI. Gave Toradol injection in office for headache. Motrin 800 mg q8 PRN for pain. Claritin 10 mg daily for possible allergies and conjunctivitis. Follow up with PCP as needed.

## 2012-03-09 NOTE — Progress Notes (Signed)
  Subjective:    Patient ID: Blake Vaughn, male    DOB: 31-Jul-1961, 51 y.o.   MRN: [id number]  HPI  Patients presents to clinic for headache, sore throat, and cough.  Symptoms started 4 days ago.  Patient denies history of headaches or family hx of migraines.  Cough is dry.  He describes sore throat as "hurts to swallow food" and "pain at the back/roof of mouth."  Headache is located frontal lobe and not associated with neck pain, changes in vision, nausea/vomiting.    He denies any dysuria, diarrhea, or decreased appetite.  Denies fever at home, but endorses chills, no night sweats.  Denies any nasal congestion.  He has not tried any OTC medications yet.    Review of Systems  Per HPI    Objective:   Physical Exam  Constitutional: No distress.  HENT:  Head: Normocephalic and atraumatic.  Mouth/Throat: Oropharynx is clear and moist. No oropharyngeal exudate.  Eyes: EOM are normal. Pupils are equal, round, and reactive to light. Right eye exhibits no discharge. Left eye exhibits no discharge.       Conjunctivitis bilateral left worse than right.  Neck: Normal range of motion. Neck supple.  Cardiovascular: Normal heart sounds.   Pulmonary/Chest: Effort normal.  Abdominal: Soft. He exhibits no distension. There is no tenderness. There is no rebound.  Neurological: He is alert. No cranial nerve deficit.  Skin: No rash noted.         Assessment & Plan:

## 2012-03-09 NOTE — Patient Instructions (Addendum)
It was nice to meet you today Blake Vaughn. Your headache is likely due to both a head cold and allergies. Start taking Zyrtec one tablet daily for allergies (over the counter). For headache, take Motrin 800 mg 1 tablet every 8 hours as needed for pain. Purchase over the counter Cepacol lozenges for sore throat. I will also send Lidocaine mouth wash to your pharmacy. If your symptoms worsen or you develop fever temp > 102, productive cough, vomiting, worsening headache, please return to clinic.  Headache and Allergies The relationship between allergies and headaches is unclear. Many people with allergic or infectious nasal problems also have headaches (migraines or sinus headaches). However, sometimes allergies can cause pressure that feels like a headache, and sometimes headaches can cause allergy-like symptoms. It is not always clear whether your symptoms are caused by allergies or by a headache.  CAUSES   Migraine: The cause of a migraine is not always known.   Sinus Headache: The cause of a sinus headache may be a sinus infection. Other conditions that may be related to sinus headaches include:   Hay fever (allergic rhinitis).   Deviation of the nasal septum.   Swelling or clogging of the nasal passages.  SYMPTOMS  Migraine headache symptoms (which often last 4 to 72 hours) include:  Intense, throbbing pain on one or both sides of the head.   Nausea.   Vomiting.   Being extra sensitive to light.   Being extra sensitive to sound.   Nervous system reactions that appear similar to an allergic reaction:   Stuffy nose.   Runny nose.   Tearing.  Sinus headaches are felt as facial pain or pressure.  DIAGNOSIS  Because there is some overlap in symptoms, sinus and migraine headaches are often misdiagnosed. For example, a person with migraines may also feel facial pressure. Likewise, many people with hay fever may get migraine headaches rather than sinus headaches. These migraines  can be triggered by the histamine release during an allergic reaction. An antihistamine medicine can eliminate this pain. There are standard criteria that help clarify the difference between these headaches and related allergy or allergy-like symptoms. Your caregiver can use these criteria to determine the proper diagnosis and provide you the best care. TREATMENT  Migraine medicine may help people who have persistent migraine headaches even though their hay fever is controlled. For some people, anti-inflammatory treatments do not work to relieve migraines. Medicines called triptans (such as sumatriptan) can be helpful for those people. Document Released: 02/14/2004 Document Revised: 11/13/2011 Document Reviewed: 03/08/2010 [facility] Patient Information 2012 [place], [place].

## 2012-03-19 ENCOUNTER — Ambulatory Visit (INDEPENDENT_AMBULATORY_CARE_PROVIDER_SITE_OTHER): Payer: Medicaid Other | Admitting: Family Medicine

## 2012-03-19 VITALS — BP 133/76 | HR 89 | Temp 98.6°F | Ht 65.0 in | Wt 195.0 lb

## 2012-03-19 DIAGNOSIS — J029 Acute pharyngitis, unspecified: Secondary | ICD-10-CM

## 2012-03-19 DIAGNOSIS — I889 Nonspecific lymphadenitis, unspecified: Secondary | ICD-10-CM

## 2012-03-19 DIAGNOSIS — R05 Cough: Secondary | ICD-10-CM

## 2012-03-19 LAB — POCT RAPID STREP A (OFFICE): Rapid Strep A Screen: NEGATIVE

## 2012-03-19 MED ORDER — BENZONATATE 100 MG PO CAPS: 100.0000 mg | ORAL_CAPSULE | Freq: Four times a day (QID) | ORAL | Status: DC | PRN

## 2012-03-19 MED ORDER — AMOXICILLIN 500 MG PO CAPS: 500.0000 mg | ORAL_CAPSULE | Freq: Two times a day (BID) | ORAL | Status: AC

## 2012-03-19 MED ORDER — CHLORPHENIRAMINE-PSEUDOEPH 8-120 MG PO CPCR: 1.0000 | ORAL_CAPSULE | Freq: Two times a day (BID) | ORAL | Status: AC

## 2012-03-19 NOTE — Patient Instructions (Signed)
I have sent in an antibiotic and 2 medicines for your cough. Please come back in 2 weeks to let us recheck you Come back sooner if you're getting worse

## 2012-03-23 NOTE — Assessment & Plan Note (Addendum)
Sore throat of several weeks duration in a non-Hodgkin's lymphoma patient. Likely post viral cough but plan to follow to ensure resolution. Reassuring that he has had recent CT scans without recurrence. Plan to try cough medicine to help him with his cough. He was treated for bronchitis and sinusitis in March and this may be a post viral cough. He is to return for followup.

## 2012-03-23 NOTE — Progress Notes (Signed)
  Subjective:    Patient ID: Blake Vaughn, male    DOB: 1961-07-18, 51 y.o.   MRN: [id number]  HPI Patient presents today with 2 weeks of sore throat. He was seen 10 days ago by Dr. Domenick Bookbinder. He does not feel he has gotten any better since then. He states that he has significant cough productive of yellow mucus, and his throat hurts when he eats. He has not had any fever but he has had itchy eyes and some morning crusting. He denies nausea or vomiting. He denies weight loss. He is a nonsmoker, does not have problems with heartburn, does not drink alcohol.  Patient with a significant past medical history of non-Hodgkin's lymphoma. He is being followed by oncology. He had a CT scan February 15 that did not show any recurrence.   Review of Systems See above, otherwise negative.    Objective:   Physical Exam Vital signs reviewed General appearance - alert, well appearing, and in no distress Heart - normal rate, regular rhythm, normal S1, S2, no murmurs, rubs, clicks or gallops Chest - clear to auscultation, no wheezes, rales or rhonchi, symmetric air entry, no tachypnea, retractions or cyanosis Eyes - pupils equal and reactive, extraocular eye movements intact, sclera anicteric Ears - bilateral TM's and external ear canals normal, right ear normal, left ear normal Nose - some clear discharge present Throat-tonsils enlarged symmetrically 1+. No drainage seen. Mild erythema posterior pharynx Lymph nodes-there is a small, half centimeter, enlarged lymph node on the right submental area. This is mildly tender.       Assessment & Plan:

## 2012-03-23 NOTE — Assessment & Plan Note (Signed)
Small, half centimeter lymph node enlarged under the chin. Plan to see back for followup. Amoxicillin to treat for any possible adenitis.

## 2012-04-30 ENCOUNTER — Encounter: Payer: Self-pay | Admitting: Family Medicine

## 2012-04-30 ENCOUNTER — Ambulatory Visit (HOSPITAL_COMMUNITY)
Admission: RE | Admit: 2012-04-30 | Discharge: 2012-04-30 | Disposition: A | Discharge status: Home / Self Care | Payer: Medicaid Other | Source: Ambulatory Visit | Attending: Family Medicine | Admitting: Family Medicine

## 2012-04-30 ENCOUNTER — Ambulatory Visit (INDEPENDENT_AMBULATORY_CARE_PROVIDER_SITE_OTHER): Payer: Medicaid Other | Admitting: Family Medicine

## 2012-04-30 VITALS — BP 135/82 | HR 97 | Temp 98.3°F | Ht 65.0 in | Wt 192.0 lb

## 2012-04-30 DIAGNOSIS — R05 Cough: Secondary | ICD-10-CM | POA: Insufficient documentation

## 2012-04-30 DIAGNOSIS — C8589 Other specified types of non-Hodgkin lymphoma, extranodal and solid organ sites: Secondary | ICD-10-CM

## 2012-04-30 DIAGNOSIS — R059 Cough, unspecified: Secondary | ICD-10-CM | POA: Insufficient documentation

## 2012-04-30 DIAGNOSIS — E119 Type 2 diabetes mellitus without complications: Secondary | ICD-10-CM

## 2012-04-30 DIAGNOSIS — E785 Hyperlipidemia, unspecified: Secondary | ICD-10-CM

## 2012-04-30 LAB — CBC WITH DIFFERENTIAL/PLATELET
Basophils Absolute: 0 10*3/uL (ref 0.0–0.1)
HCT: 42.7 % (ref 39.0–52.0)
Hemoglobin: 14.8 g/dL (ref 13.0–17.0)
Lymphocytes Relative: 15 % (ref 12–46)
Lymphs Abs: 1.4 10*3/uL (ref 0.7–4.0)
Monocytes Absolute: 1.2 10*3/uL — ABNORMAL HIGH (ref 0.1–1.0)
Monocytes Relative: 13 % — ABNORMAL HIGH (ref 3–12)
Neutro Abs: 6.3 10*3/uL (ref 1.7–7.7)
RBC: 5.06 MIL/uL (ref 4.22–5.81)
WBC: 9 10*3/uL (ref 4.0–10.5)

## 2012-04-30 IMAGING — CR DG CHEST 2V
2 series · 2 of 2 positions shown · non-contrast
Comparison: CT scan [DATE] and chest x-ray [DATE]

CLINICAL DATA: Cough

CHEST - 2 VIEW

[w chest pa]
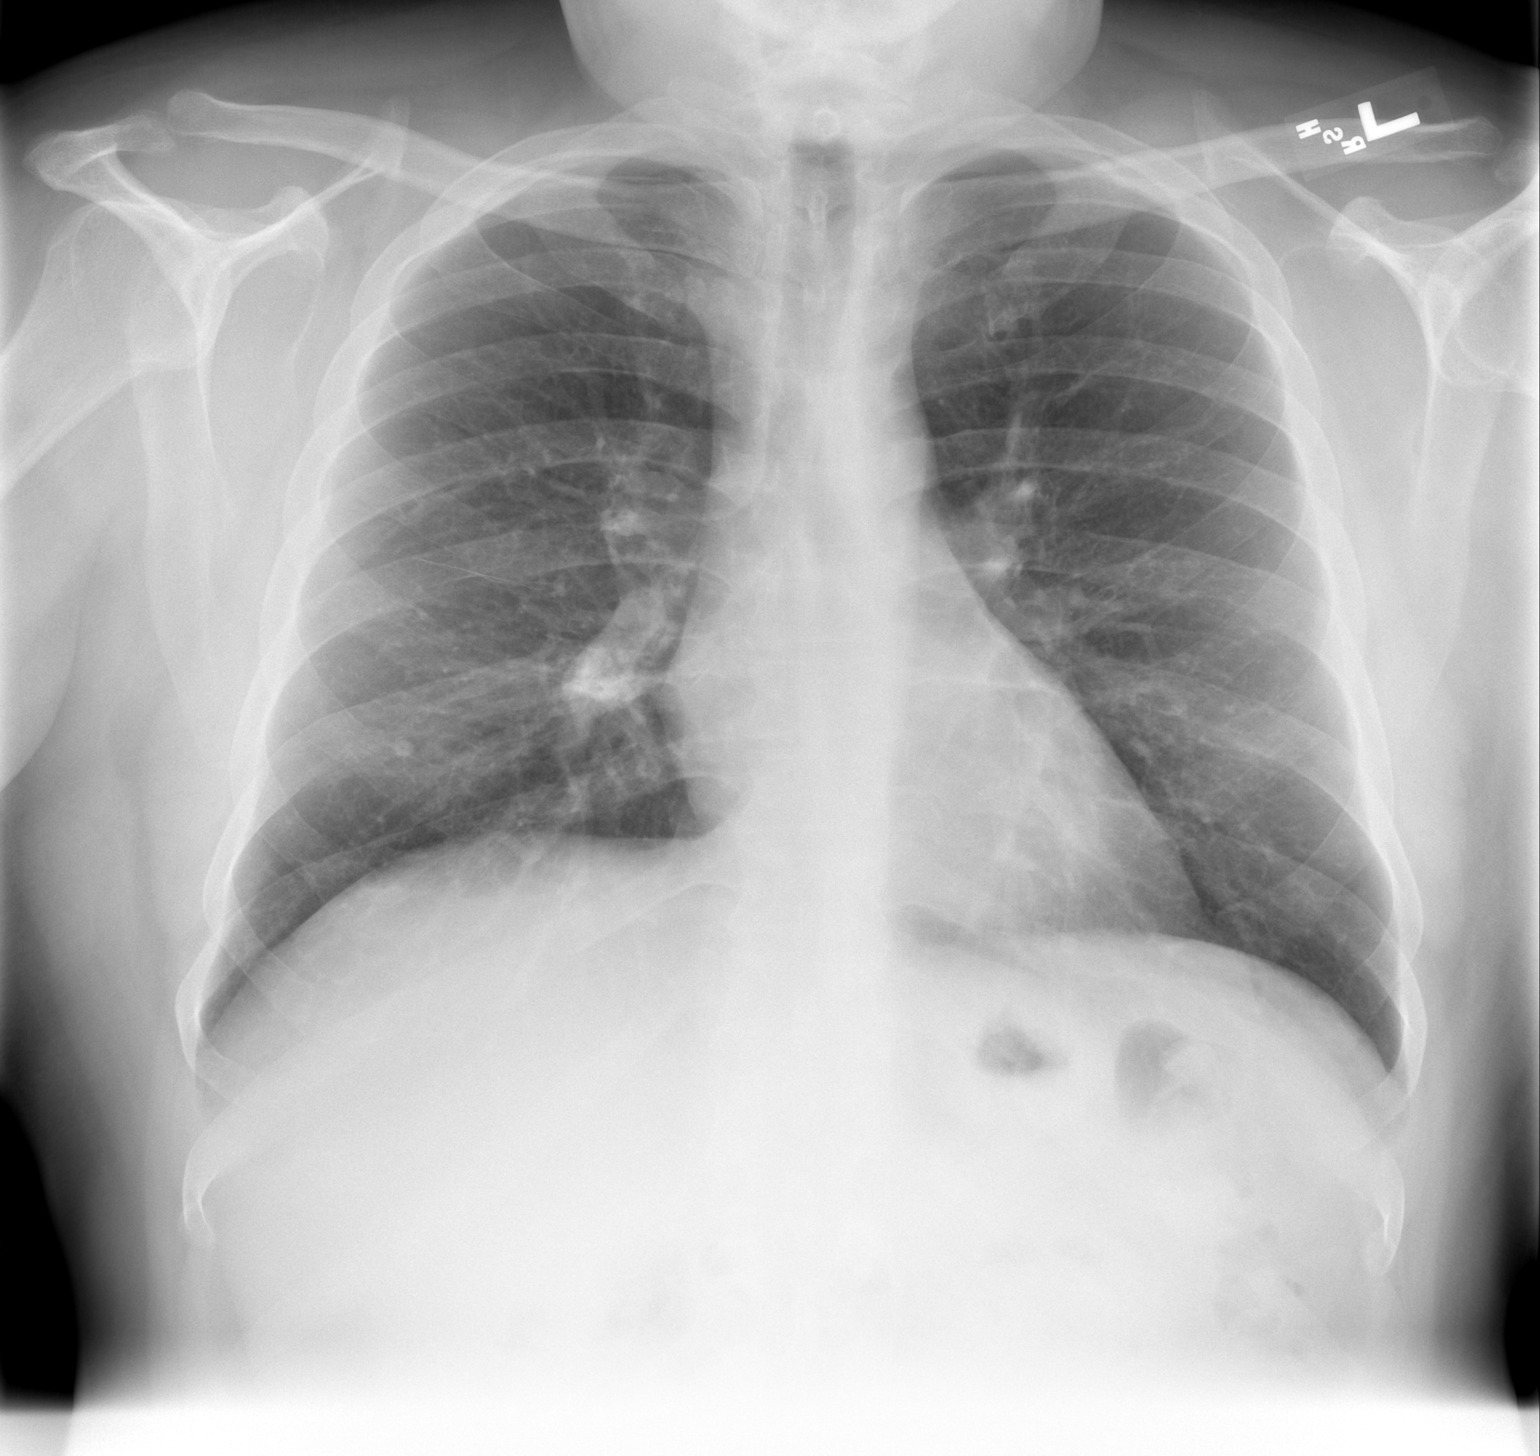

[w chest lat]
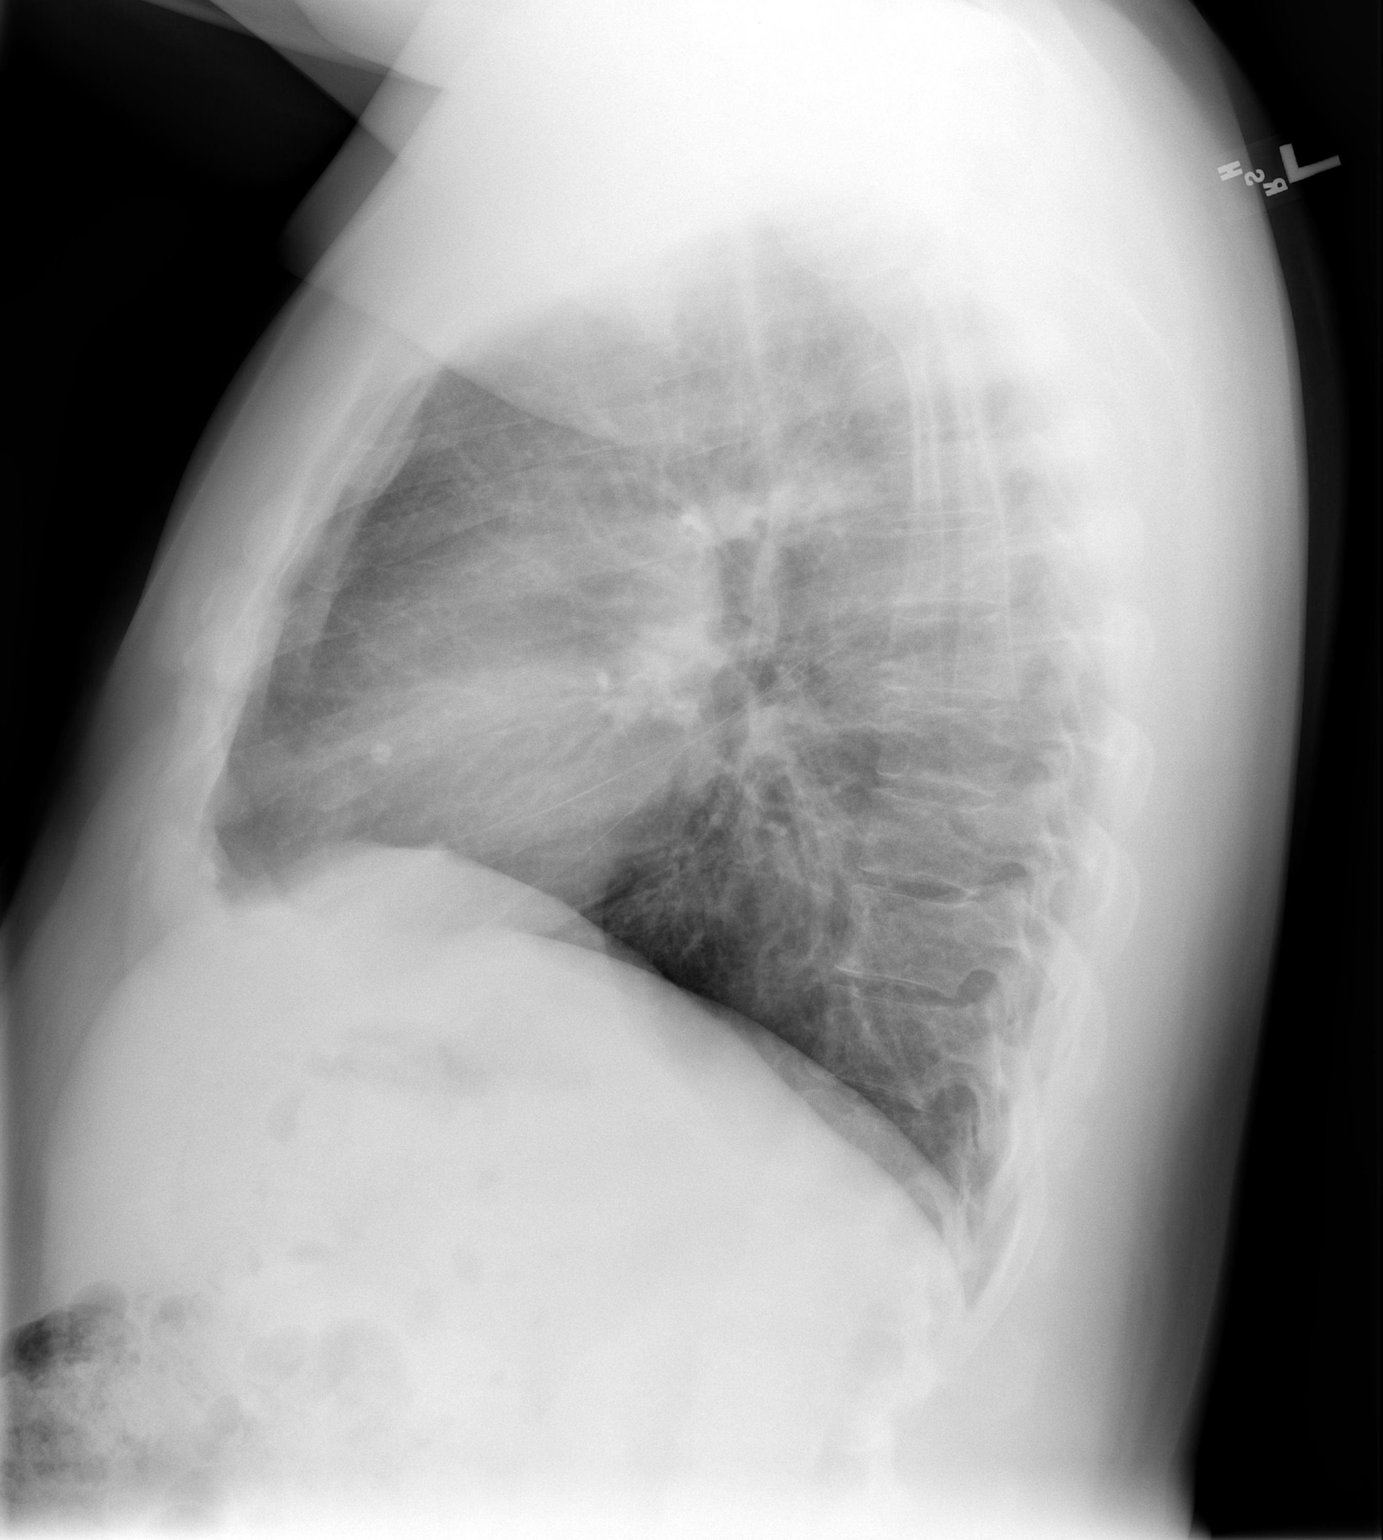

[2 of 2 positions shown; findings below may reference images not displayed]

FINDINGS: Cardiomediastinal silhouette is stable.  No acute
infiltrate or pleural effusion.  No pulmonary edema.  Small
granuloma right lower lobe is stable.
IMPRESSION: No active disease.  No significant change.

## 2012-04-30 MED ORDER — LOSARTAN POTASSIUM 50 MG PO TABS: 50.0000 mg | ORAL_TABLET | Freq: Every day | ORAL | Status: DC

## 2012-04-30 MED ORDER — AMOXICILLIN-POT CLAVULANATE 875-125 MG PO TABS: 1.0000 | ORAL_TABLET | Freq: Two times a day (BID) | ORAL | Status: AC

## 2012-04-30 MED ORDER — BENZONATATE 100 MG PO CAPS: 200.0000 mg | ORAL_CAPSULE | Freq: Three times a day (TID) | ORAL | Status: DC | PRN

## 2012-04-30 NOTE — Assessment & Plan Note (Signed)
Patient has no red flags such as fever, weight loss. Patient follows up with Dr. Shirline Frees on an annual basis.

## 2012-04-30 NOTE — Patient Instructions (Addendum)
Nice to meet you. I am going to get some labs as well as a chest x-ray on you today. I will call you with the results. I'm giving you an antibiotic in case she do have an early pneumonia. It seems you have been dealing with this for 3 months. I when she to come back and see Dr. Mauricio Po next week I have made an appointment for you. Please stop your benazapril.  I have sent in a new medication for you.

## 2012-04-30 NOTE — Assessment & Plan Note (Signed)
Patient's cough has very large differential. Patient has been treated twice for bronchitis as well as sinusitis over the course of last 2 months with no improvement. Patient has a history of cancer which makes him potentially immunosuppressed. Will get chest x-ray to rule out any type of pneumonia. Will use broaden antibiotics with Augmentin. In the differential also patient's lisinopril to be causing a dry cough. We have switched him from lisinopril to losartan. Patient will followup in one week's time with primary care provider. Discuss with patient and given red flags and when to seek medical attention. Also would consider and differential gastroesophageal reflux disease it does not improve in would do trial of PPI or also consider Candida infection secondary to his diabetes.

## 2012-04-30 NOTE — Assessment & Plan Note (Signed)
Patient has not been coming in for any of his regular schedule office visits. Decided to get an A1c while he is here as well do his annual labs. Patient states that he has been very well controlled but would like to know myself. Followup with primary care provider in one week.

## 2012-04-30 NOTE — Progress Notes (Signed)
  Subjective:    Patient ID: Blake Vaughn, male    DOB: [birth year], 51 y.o.   MRN: [id number]  HPI  51 year old male with a past medical history significant for non-Hodgkin's him, coming in with a cough. Duration: Patient has had this intermittently for 3 months. Productive: Patient states that the cough is nonproductive but feels like he has something in his throat at all times. Associated symptoms: Patient states he has felt feverish but no real fever no weight loss but has had a sore throat. What makes it better: Nothing Makes it worse: Nothing   Diabetes:  High at home: 150 Low at home: 120 Taking medications: Yes Side effects: No ROS: denies fever, chills, dizziness, loss of conscieness, polyuria poly dipsia numbness or tingling in extremities or chest pain. Lab Results  Component Value Date   HGBA1C 9.6 11/21/2011    Patient has a history of non-Hodgkin's lymphoma. Patient follows up yearly with Dr. Shirline Frees had a CT scan just recently which was negative for any recurrence of cancer.  Review of Systems As stated above in history of present illness    Objective:   Physical Exam Gen: NAD patient does have injection in both eyes bilaterally. HEENT: Pupils equal round reactive to light extraocular movements intact patient's tympanic membranes visualized bilaterally mild erythema nonbulging non-retracted. Patient's posterior pharynx is red and erythemic. 3 vascular: Regular rhythm no murmur Pulmonary: Patient does have rhonchi in the right middle lobe otherwise unremarkable. No wheezing Extremities: Neurovascularly intact    Assessment & Plan:

## 2012-05-01 LAB — LIPID PANEL
Cholesterol: 187 mg/dL (ref 0–200)
HDL: 37 mg/dL — ABNORMAL LOW (ref >39)
Triglycerides: 168 mg/dL — ABNORMAL HIGH (ref <150)
VLDL: 34 mg/dL (ref 0–40)

## 2012-05-01 LAB — BASIC METABOLIC PANEL
BUN: 12 mg/dL (ref 6–23)
CO2: 26 mEq/L (ref 19–32)
Calcium: 9.2 mg/dL (ref 8.4–10.5)
Chloride: 103 mEq/L (ref 96–112)
Creat: 0.79 mg/dL (ref 0.50–1.35)

## 2012-05-07 ENCOUNTER — Ambulatory Visit: Payer: Medicaid Other | Admitting: Family Medicine

## 2012-08-04 ENCOUNTER — Emergency Department (HOSPITAL_COMMUNITY)
Admission: EM | Admit: 2012-08-04 | Discharge: 2012-08-04 | Disposition: A | Discharge status: Nursing Home (Includes ICF) | Payer: Self-pay | Source: Home / Self Care | Attending: Emergency Medicine | Admitting: Emergency Medicine

## 2012-08-04 ENCOUNTER — Encounter (HOSPITAL_COMMUNITY): Payer: Self-pay | Admitting: *Deleted

## 2012-08-04 ENCOUNTER — Emergency Department (HOSPITAL_COMMUNITY)
Admission: EM | Admit: 2012-08-04 | Discharge: 2012-08-04 | Disposition: A | Discharge status: Home / Self Care | Payer: Self-pay | Attending: Emergency Medicine | Admitting: Emergency Medicine

## 2012-08-04 DIAGNOSIS — Y9289 Other specified places as the place of occurrence of the external cause: Secondary | ICD-10-CM | POA: Insufficient documentation

## 2012-08-04 DIAGNOSIS — Z87891 Personal history of nicotine dependence: Secondary | ICD-10-CM | POA: Insufficient documentation

## 2012-08-04 DIAGNOSIS — S01112A Laceration without foreign body of left eyelid and periocular area, initial encounter: Secondary | ICD-10-CM

## 2012-08-04 DIAGNOSIS — IMO0002 Reserved for concepts with insufficient information to code with codable children: Secondary | ICD-10-CM

## 2012-08-04 DIAGNOSIS — Z87898 Personal history of other specified conditions: Secondary | ICD-10-CM | POA: Insufficient documentation

## 2012-08-04 DIAGNOSIS — W01119A Fall on same level from slipping, tripping and stumbling with subsequent striking against unspecified sharp object, initial encounter: Secondary | ICD-10-CM | POA: Insufficient documentation

## 2012-08-04 DIAGNOSIS — S0180XA Unspecified open wound of other part of head, initial encounter: Secondary | ICD-10-CM

## 2012-08-04 DIAGNOSIS — Y990 Civilian activity done for income or pay: Secondary | ICD-10-CM | POA: Insufficient documentation

## 2012-08-04 DIAGNOSIS — I10 Essential (primary) hypertension: Secondary | ICD-10-CM | POA: Insufficient documentation

## 2012-08-04 DIAGNOSIS — W268XXA Contact with other sharp object(s), not elsewhere classified, initial encounter: Secondary | ICD-10-CM | POA: Insufficient documentation

## 2012-08-04 DIAGNOSIS — E119 Type 2 diabetes mellitus without complications: Secondary | ICD-10-CM | POA: Insufficient documentation

## 2012-08-04 DIAGNOSIS — E7800 Pure hypercholesterolemia, unspecified: Secondary | ICD-10-CM | POA: Insufficient documentation

## 2012-08-04 MED ORDER — LIDOCAINE-EPINEPHRINE 2 %-1:100000 IJ SOLN: 5.0000 mL | Freq: Once | INTRAMUSCULAR | Status: DC

## 2012-08-04 MED ORDER — BACITRACIN 500 UNIT/GM EX OINT: 1.0000 "application " | TOPICAL_OINTMENT | Freq: Once | CUTANEOUS | Status: DC

## 2012-08-04 NOTE — ED Notes (Signed)
Pt reports tripping at work this afternoon and hitting face on rock. Pt with laceration to right eyebrow. Bleeding controlled. Well approximated. Denies LOC. Perrla.

## 2012-08-04 NOTE — ED Notes (Signed)
2 hours ago at work, hit his head on a rock--denies LOC, approx 4 cm lateral left eyebrow with minimal bleeding

## 2012-08-04 NOTE — ED Provider Notes (Signed)
History     CSN: [id number]  Arrival date & time 08/04/12  1738   First MD Initiated Contact with Patient 08/04/12 1902      Chief Complaint  Patient presents with  . Fall  . Facial Laceration    HPI Pt reports tripping at work this afternoon and hitting face on rock. Pt with laceration to right eyebrow. Bleeding controlled. Well approximated. Denies LOC.  Past Medical History  Diagnosis Date  . Hypertension 11/27/2011  . Diabetes mellitus   . Hypercholesterolemia   . Lymphoma     gets annual chemo last tx Feb 2013  . Lymphoma     History reviewed. No pertinent past surgical history.  History reviewed. No pertinent family history.  History  Substance Use Topics  . Smoking status: Former Games developer  . Smokeless tobacco: Former Neurosurgeon    Quit date: 11/20/2006  . Alcohol Use: No      Review of Systems  All other systems reviewed and are negative.    Allergies  Iohexol  Home Medications   Current Outpatient Rx  Name Route Sig Dispense Refill  . GLIPIZIDE 10 MG PO TABS Oral Take 1 tablet (10 mg total) by mouth 2 (two) times daily before a meal. 60 tablet 6  . METFORMIN HCL 1000 MG PO TABS Oral Take 1 tablet (1,000 mg total) by mouth 2 (two) times daily with a meal. 60 tablet 6    BP 140/84  Pulse 78  Temp 98.1 F (36.7 C) (Oral)  Resp 16  SpO2 97%  Physical Exam  Nursing note and vitals reviewed. Constitutional: He is oriented to person, place, and time. He appears well-developed. No distress.  HENT:  Head: Normocephalic and atraumatic.         4 cm laceration.  No active bleeding  Eyes: Pupils are equal, round, and reactive to light.  Neck: Normal range of motion.  Cardiovascular: Normal rate and intact distal pulses.   Pulmonary/Chest: No respiratory distress.  Abdominal: Normal appearance. He exhibits no distension.  Musculoskeletal: Normal range of motion.  Neurological: He is alert and oriented to person, place, and time. No cranial nerve  deficit.  Skin: Skin is warm and dry. No rash noted.  Psychiatric: He has a normal mood and affect. His behavior is normal.    ED Course  LACERATION REPAIR Date/Time: 08/04/2012 8:00 PM Performed by: Lollie Sails Authorized by: Nelia Shi Consent: Verbal consent obtained. Risks and benefits: risks, benefits and alternatives were discussed Consent given by: patient Body area: head/neck Location details: left eyebrow Laceration length: 2 cm Foreign bodies: no foreign bodies Tendon involvement: none Nerve involvement: none Vascular damage: no Anesthesia: local infiltration Local anesthetic: lidocaine 2% without epinephrine Anesthetic total: 1.5 ml Patient sedated: no Preparation: Patient was prepped and draped in the usual sterile fashion. Irrigation solution: saline Irrigation method: syringe Amount of cleaning: standard Debridement: none Degree of undermining: none Skin closure: 5-0 nylon and 6-0 nylon Number of sutures: 4 Technique: simple Approximation: close Approximation difficulty: simple Patient tolerance: Patient tolerated the procedure well with no immediate complications.   (including critical care time)  Labs Reviewed - No data to display No results found.   1. Laceration       MDM  Simple laceration.  Irrigated with NS.  No foreign bodies.  Closed with (2) 5-0 nylon stiches and (2) 6-0 nylon stiches.  Antibacterial ointment applied to wound following closure.   Nelia Shi, MD 08/05/12 [phone]

## 2012-08-04 NOTE — ED Notes (Signed)
Dr Jearld Fenton paged per Urgent Care request

## 2012-08-04 NOTE — ED Notes (Signed)
Spoke with Dr Jearld Fenton. He would like EDP to evaluate pt and evaluate laceration. If they do not feel like they will be able to suture laceration please page back.

## 2012-08-04 NOTE — ED Provider Notes (Signed)
History     CSN: [id number]  Arrival date & time 08/04/12  1425   First MD Initiated Contact with Patient 08/04/12 1433      Chief Complaint  Patient presents with  . Facial Laceration    (Consider location/radiation/quality/duration/timing/severity/associated sxs/prior treatment) HPI Comments: Patient states she slipped and fell, hitting his left eyebrow on a rock. No loss of consciousness, nausea, vomiting. Reports localized pain but no other headache. No visual changes, blurred vision. Denies other injury. No aggravating or alleviating factors. He applied direct pressure with hemostasis, but has not tried anything else for his symptoms. He is a diabetic, and has a history of lymphoma for which he is taking annual chemotherapy. Last treatment was in February 2013.  ROS as noted in HPI. All other ROS negative.   Patient is a 51 y.o. male presenting with facial injury. The history is provided by the patient. The history is limited by a language barrier. A language interpreter was used.  Facial Injury  The incident occurred just prior to arrival. The incident occurred at work. The injury mechanism was a fall. It is unlikely that a foreign body is present. Associated symptoms include headaches. Pertinent negatives include no numbness, no nausea, no vomiting, no neck pain, no focal weakness, no loss of consciousness, no weakness and no memory loss. There have been no prior injuries to these areas. His tetanus status is UTD. He has been behaving normally.    Past Medical History  Diagnosis Date  . Hypertension 11/27/2011  . Diabetes mellitus   . Hypercholesterolemia   . Lymphoma     gets annual chemo last tx Feb 2013  . Lymphoma     History reviewed. No pertinent past surgical history.  History reviewed. No pertinent family history.  History  Substance Use Topics  . Smoking status: Former Games developer  . Smokeless tobacco: Former Neurosurgeon    Quit date: 11/20/2006  . Alcohol Use: No       Review of Systems  HENT: Negative for neck pain.   Gastrointestinal: Negative for nausea and vomiting.  Neurological: Positive for headaches. Negative for focal weakness, loss of consciousness, weakness and numbness.  Psychiatric/Behavioral: Negative for memory loss.    Allergies  Iohexol  Home Medications   Current Outpatient Rx  Name Route Sig Dispense Refill  . GLIPIZIDE 10 MG PO TABS Oral Take 1 tablet (10 mg total) by mouth 2 (two) times daily before a meal. 60 tablet 6  . METFORMIN HCL 1000 MG PO TABS Oral Take 1 tablet (1,000 mg total) by mouth 2 (two) times daily with a meal. 60 tablet 6  . MAGIC MOUTHWASH W/LIDOCAINE Oral Take 2 mLs by mouth 3 (three) times daily as needed. 10 mL 0  . ASPIRIN 81 MG PO TABS Oral Take 81 mg by mouth daily.      Marland Kitchen LORATADINE 10 MG PO TABS Oral Take 1 tablet (10 mg total) by mouth daily. 30 tablet 2  . LOSARTAN POTASSIUM 50 MG PO TABS Oral Take 1 tablet (50 mg total) by mouth daily. 90 tablet 3  . OVER THE COUNTER MEDICATION  Tylenol/325mg    Guaifenesin 200mg   Dextromethorphan 200mg     . PRAVASTATIN SODIUM 40 MG PO TABS Oral Take 1 tablet (40 mg total) by mouth at bedtime. 30 tablet 0    BP 133/77  Pulse 97  Temp 98.2 F (36.8 C) (Oral)  Resp 16  SpO2 100%  Physical Exam  Nursing note and vitals reviewed. Constitutional: He  is oriented to person, place, and time. He appears well-developed and well-nourished.  HENT:  Head: Normocephalic and atraumatic.  Eyes: Conjunctivae and EOM are normal. Pupils are equal, round, and reactive to light.         Deep 3 cm laceration, bone visible. See picture. No palpable fx.   Neck: Normal range of motion.  Cardiovascular: Normal rate.   Pulmonary/Chest: Effort normal. No respiratory distress.  Abdominal: He exhibits no distension.  Musculoskeletal: Normal range of motion.  Neurological: He is alert and oriented to person, place, and time. Coordination normal.  Skin: Skin is warm and dry.   Psychiatric: He has a normal mood and affect. His behavior is normal. Judgment and thought content normal.    ED Course  Procedures (including critical care time)  Labs Reviewed - No data to display No results found.   1. Laceration of eyebrow, left, complicated     MDM  Patient with deep complex laceration over  left eyebrow. Feel that patient would benefit from specialty evaluation and/or repair. Discussed case with Dr. Jearld Fenton, ENT on call, who suggested transfer to the ED for repair. Patient is stable for transfer via shuttle.     Luiz Blare, MD 08/04/12 1723

## 2012-11-09 ENCOUNTER — Encounter: Payer: Self-pay | Admitting: Internal Medicine

## 2012-11-09 NOTE — Progress Notes (Signed)
Patient came in with Raynelle Fanning the intrptor. Wanted new application for assistance. I didn't see where he had applied before. He was denied Medicaid. I advised him that letter needs to accompany his application. He is the only one working-family of 4. He has not banking account. I advised him where to send application back.

## 2012-11-11 ENCOUNTER — Encounter: Payer: Self-pay | Admitting: Family Medicine

## 2012-11-11 ENCOUNTER — Ambulatory Visit (INDEPENDENT_AMBULATORY_CARE_PROVIDER_SITE_OTHER): Payer: Self-pay | Admitting: Family Medicine

## 2012-11-11 VITALS — BP 130/80 | HR 76 | Temp 98.0°F | Ht 65.0 in | Wt 201.9 lb

## 2012-11-11 DIAGNOSIS — R109 Unspecified abdominal pain: Secondary | ICD-10-CM

## 2012-11-11 LAB — CBC WITH DIFFERENTIAL/PLATELET
Basophils Relative: 0 % (ref 0–1)
Eosinophils Absolute: 1.3 10*3/uL — ABNORMAL HIGH (ref 0.0–0.7)
HCT: 44.8 % (ref 39.0–52.0)
Hemoglobin: 15.3 g/dL (ref 13.0–17.0)
MCH: 29.9 pg (ref 26.0–34.0)
MCHC: 34.2 g/dL (ref 30.0–36.0)
Monocytes Absolute: 0.6 10*3/uL (ref 0.1–1.0)
Monocytes Relative: 8 % (ref 3–12)

## 2012-11-11 LAB — COMPREHENSIVE METABOLIC PANEL
AST: 14 U/L (ref 0–37)
Albumin: 4.3 g/dL (ref 3.5–5.2)
Alkaline Phosphatase: 67 U/L (ref 39–117)
BUN: 16 mg/dL (ref 6–23)
Creat: 0.8 mg/dL (ref 0.50–1.35)
Potassium: 4.4 mEq/L (ref 3.5–5.3)

## 2012-11-11 MED ORDER — PANTOPRAZOLE SODIUM 40 MG PO TBEC: 40.0000 mg | DELAYED_RELEASE_TABLET | Freq: Every day | ORAL | Status: DC

## 2012-11-11 MED ORDER — OMEPRAZOLE 40 MG PO CPDR: 40.0000 mg | DELAYED_RELEASE_CAPSULE | Freq: Every day | ORAL | Status: DC

## 2012-11-11 NOTE — Patient Instructions (Addendum)
Thanks for coming in today!  Try this medicine, omeprazole, one pill once a day, I think it will help.   Come back on Monday or Tuesday to follow up on this pain, I'll discuss your labs then.   Seek help if you have bloody vomit or bowel movements or worsening pain.

## 2012-11-11 NOTE — Assessment & Plan Note (Signed)
Acute onset and continued now for 5 days Possible etiologies: indigestion, GERD, PUD, gastritis, pancreatitis, recurrence of lymphoma.  Without n/v/d i feel like viral gastro is less likely, no signs of bleeding so bleeding ulcer unlikely.  Will treat GERD and check labs to investigate pancreas and liver etiologies.    Stop garlic supplement CMP, CBC, Lipase Prilosec 40 qd until we f/u on Monday or Tuesday.  Cautions to seek medical help hematemesis, worsening pain Consider imaging at next visit if not improved, had a  CT abd/pelvis in dec 2012 that did not show recurrence,

## 2012-11-11 NOTE — Progress Notes (Signed)
  Subjective:    Patient ID: Blake Vaughn, male    DOB: 1961/10/13, 51 y.o.   MRN: [id number]  HPI Pt here for acute visit of abdominal pain,  PmHx of DM2, and lymphoma diagnosed 4 yrs ago and treated with chemo only  Sharp non radiating 5/10 constant epigastric pain started 4-5 days ago.  Started garlic supplement recently, stopped at onset of diarrhea Had Diarrhea which has resolved, pain continues. BMs not black or bloody, no emesis or nausea.  Not worsened by food, alleviated by a warm drink for approx 3 hours.  Also Bl flank pain for same amount of time, 3/10 and non radiating No dysuria, no etoh use, taking diabetic meds regularly but hasnt today.  Has had pain like this when he was a child- attributed to viral gastro at that time.  Worried about recurrence and thinks that garlic may be the source.  Denies trauma or strain.  Doesn't worsen with laying down.   Review of Systems No fever, chills,  No chest pain No dyspnea,  + epigatric pain, no diarrhea, no vomiting No dysuria     Objective:   Physical Exam  Gen: NAD, alert, cooperative with exam CV: RRR, no murmur, good S1/S2 Resp: CTABL, no wheezes, non-labored Abd: Soft, tender to palp in mid epigastrium, some voluntary guarding, no obvious masses or organomegaly Neuro: Alert and oriented, No gross deficits    Assessment & Plan:

## 2012-11-17 ENCOUNTER — Ambulatory Visit (INDEPENDENT_AMBULATORY_CARE_PROVIDER_SITE_OTHER): Payer: Self-pay | Admitting: Family Medicine

## 2012-11-17 VITALS — BP 120/70 | HR 86 | Temp 98.3°F | Ht 65.0 in | Wt 202.0 lb

## 2012-11-17 DIAGNOSIS — C8589 Other specified types of non-Hodgkin lymphoma, extranodal and solid organ sites: Secondary | ICD-10-CM

## 2012-11-17 DIAGNOSIS — R109 Unspecified abdominal pain: Secondary | ICD-10-CM

## 2012-11-17 NOTE — Assessment & Plan Note (Signed)
Has follow up CT scan scheduled for 2/19 and follow up visit with oncology on 2/20.

## 2012-11-17 NOTE — Assessment & Plan Note (Signed)
Markedly better since he stopped chewing garlic twice daily; and with omeprazole daily.  Will check H pylori Ab and consider triple therapy if positive.

## 2012-11-17 NOTE — Assessment & Plan Note (Signed)
For repeat A1C today and follow up to address diabetes with more focus.  I suggested that he suspend the shakes he is taking if they are contributing to his abdominal discomfort.

## 2012-11-17 NOTE — Patient Instructions (Addendum)
Fue un Research officer, trade union; me alegro que el dolor abdominal se la ha mejorado tanto;  Estoy haciendo un laboratorio hoy para una bacteria que puede estar asociada con problemas digestivos; si la prueba sale positiva, le voy a recetar 3 medicinas para tomar 2 veces por dia, por 2 semanas.   [facility] [address]   Si la prueba Economist, debe tomar la omeprazole por 4 semanas mas, y despues suspenderla.   Si el dolor abdominal no esta' completamente resuelta antes de terminar las 4 semanas mas de omeprazole, quiero que me llame.   Otra cita en Enero 2014 para tratar mas la diabetes y para seguimiento de este problema.

## 2012-11-17 NOTE — Progress Notes (Signed)
  Subjective:    Patient ID: Blake Vaughn, male    DOB: August 23, 1961, 51 y.o.   MRN: [id number]  HPI  Visit in Spanish.   Blake Vaughn comes in today for follow up of his abdominal pain.  Has been taking the omeprazole daily since his visit on 12/05, and the discomfort has almost entirely resolved.  He notes that he had started chewing Japanese garlic twice daily and drinking homemade shakes of celery, alfalfa and orange peel for his diabetes shortly before he started with the abdominal pain.  No diarrhea or constipation.  No blood per rectum. No fevers or chills.  He feels well now.   Still gets occasional mild epigastric discomfort that is short-lived and goes away spontaneously.  No nausea or vomiting.   Does not recall ever being tested or treated for H pylori.   Regarding his DM, he has been taking his medications regularly; meds reviewed with him.   Review of Systems See above    Objective:   Physical Exam Well appearing, no apparent distress.  HEENT Neck supple. No cervical adenopathy. Clear oropharynx.  COR REgular S1S2 PULM Clear bilat.  ABD Soft, nontender, nondistended No masses or megaly.  Audible bowel sounds noted.        Assessment & Plan:

## 2013-01-21 ENCOUNTER — Other Ambulatory Visit: Payer: Self-pay | Admitting: Family Medicine

## 2013-01-24 ENCOUNTER — Other Ambulatory Visit: Payer: Self-pay | Admitting: *Deleted

## 2013-01-25 ENCOUNTER — Encounter: Payer: Self-pay | Admitting: Internal Medicine

## 2013-01-25 MED ORDER — GLIPIZIDE 10 MG PO TABS: 10.0000 mg | ORAL_TABLET | Freq: Two times a day (BID) | ORAL | Status: DC

## 2013-01-25 MED ORDER — METFORMIN HCL 1000 MG PO TABS: 1000.0000 mg | ORAL_TABLET | Freq: Two times a day (BID) | ORAL | Status: DC

## 2013-01-25 NOTE — Progress Notes (Signed)
Called patient and I advised him that he needs to fill out Medicaid application and get back to them. I advised him that our app needs to come back to [place]. He said he wanted me to call Raynelle Fanning and let her know. I called and left a message for Raynelle Fanning to call me so I an advise her to let him know what to do.

## 2013-01-25 NOTE — Addendum Note (Signed)
Addended by: Damita Lack on: 01/25/2013 08:38 AM   Modules accepted: Orders

## 2013-01-25 NOTE — Progress Notes (Signed)
Pt came in with financial concerns about his future visits.  Pt had Medicaid that expired.  I gave pt another Medicaid application as well as an EPP application.  I called DSS to inquire about pt's Medicaid.  The rep informed me that pt just has to reapply.  I will contact pt and relay that message to him.

## 2013-01-26 ENCOUNTER — Other Ambulatory Visit: Payer: Self-pay

## 2013-01-26 ENCOUNTER — Encounter (HOSPITAL_COMMUNITY): Payer: Self-pay

## 2013-01-26 ENCOUNTER — Ambulatory Visit (HOSPITAL_COMMUNITY)
Admission: RE | Admit: 2013-01-26 | Discharge: 2013-01-26 | Disposition: A | Discharge status: Home / Self Care | Payer: Medicaid Other | Source: Ambulatory Visit | Attending: Internal Medicine | Admitting: Internal Medicine

## 2013-01-26 DIAGNOSIS — C8589 Other specified types of non-Hodgkin lymphoma, extranodal and solid organ sites: Secondary | ICD-10-CM

## 2013-01-26 DIAGNOSIS — Z79899 Other long term (current) drug therapy: Secondary | ICD-10-CM | POA: Insufficient documentation

## 2013-01-26 LAB — CBC WITH DIFFERENTIAL/PLATELET
Basophils Absolute: 0 10*3/uL (ref 0.0–0.1)
Eosinophils Absolute: 0 10*3/uL (ref 0.0–0.5)
HGB: 15.6 g/dL (ref 13.0–17.1)
LYMPH%: 6.2 % — ABNORMAL LOW (ref 14.0–49.0)
MCV: 87.2 fL (ref 79.3–98.0)
MONO%: 2 % (ref 0.0–14.0)
NEUT#: 13.6 10*3/uL — ABNORMAL HIGH (ref 1.5–6.5)
Platelets: 202 10*3/uL (ref 140–400)

## 2013-01-26 LAB — COMPREHENSIVE METABOLIC PANEL (CC13)
CO2: 23 mEq/L (ref 22–29)
Calcium: 9.8 mg/dL (ref 8.4–10.4)
Glucose: 268 mg/dl — ABNORMAL HIGH (ref 70–99)
Sodium: 137 mEq/L (ref 136–145)
Total Bilirubin: 0.52 mg/dL (ref 0.20–1.20)
Total Protein: 7.3 g/dL (ref 6.4–8.3)

## 2013-01-26 IMAGING — CT CT CHEST W/ CM
2 of 5 series · 17 of 46 positions shown, 19 images · IV contrast (OMNIPAQUE)
Comparison: Multiple priors, most recently [DATE]

CT CHEST

CLINICAL DATA: Follow up non-Hodgkins lymphoma, rituxin ongoing

CT CHEST, ABDOMEN AND PELVIS WITH CONTRAST
TECHNIQUE: Multidetector CT imaging of the chest, abdomen and
pelvis was performed following the standard protocol during bolus
administration of intravenous contrast.
Contrast: 100mL OMNIPAQUE IOHEXOL 300 MG/ML  SOLN

[Series 2: cap with st · axial · 0.82mm/px · z∈[-568,-24]mm · 14 of 123 slices shown, 16 images]
[im 7/123  soft-tissue]
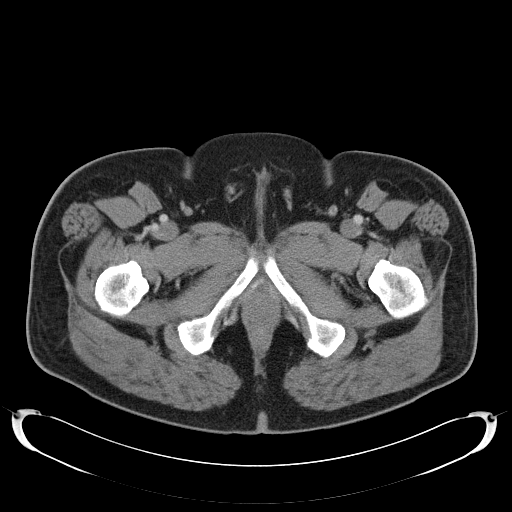
[im 7/123  bone]
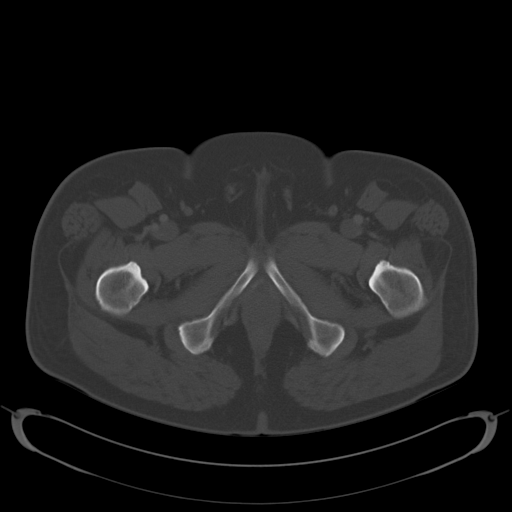
[im 14/123  soft-tissue]
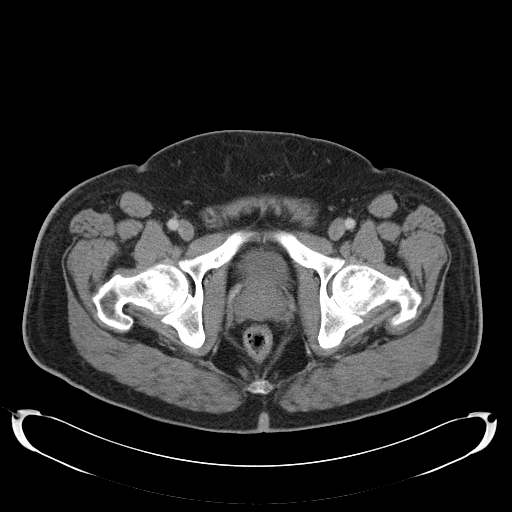
[im 28/123  soft-tissue]
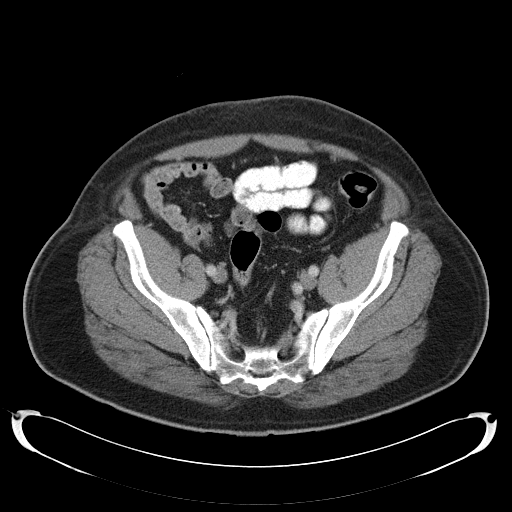
[im 34/123  soft-tissue]
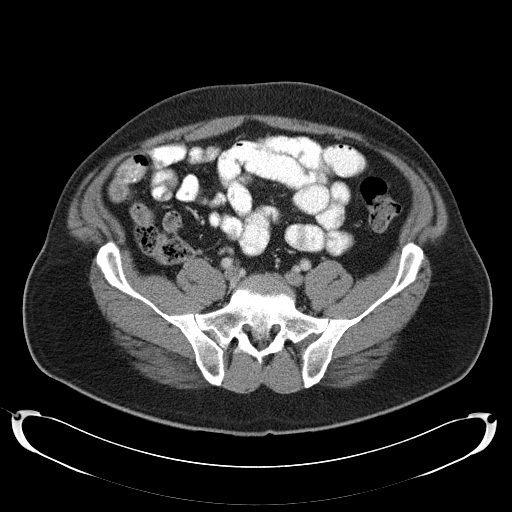
[im 41/123  soft-tissue]
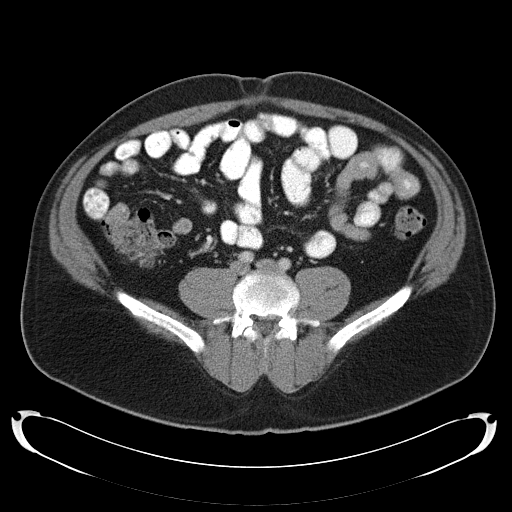
[im 48/123  soft-tissue]
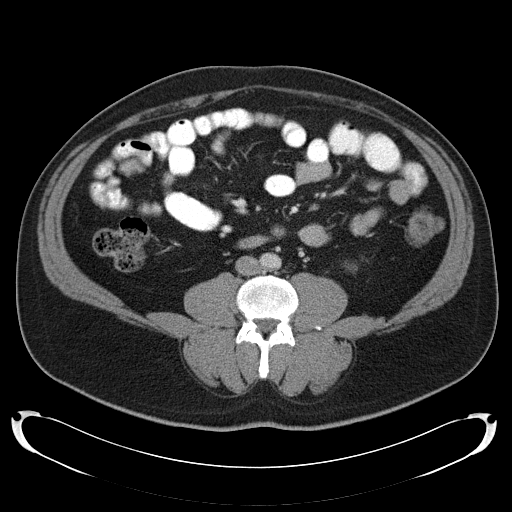
[im 55/123  soft-tissue]
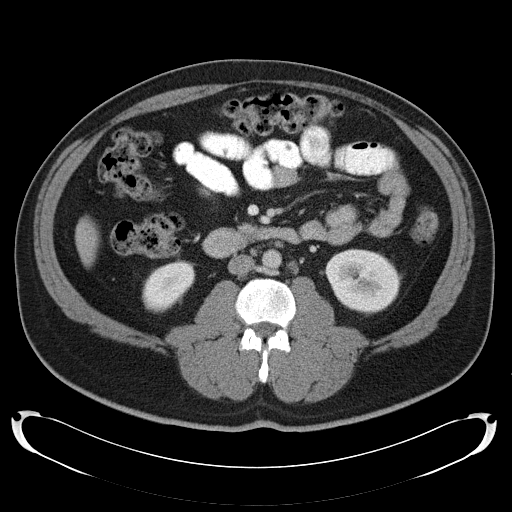
[im 68/123  soft-tissue]
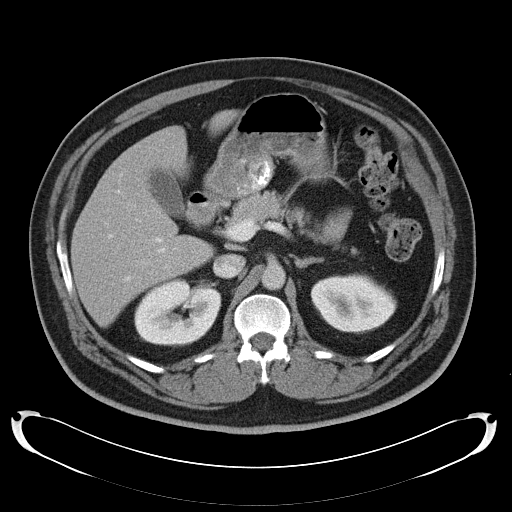
[im 75/123  soft-tissue]
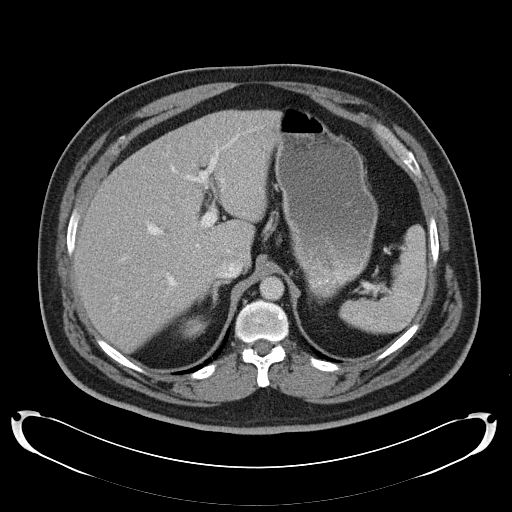
[im 75/123  bone]
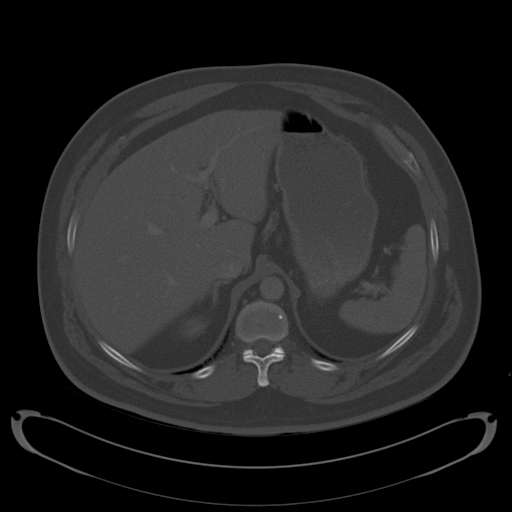
[im 82/123  soft-tissue]
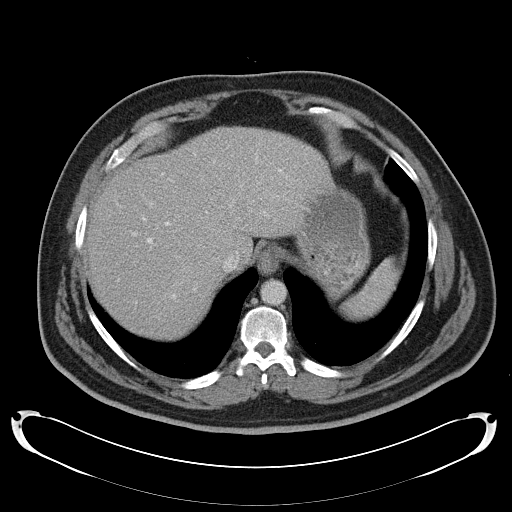
[im 89/123  soft-tissue]
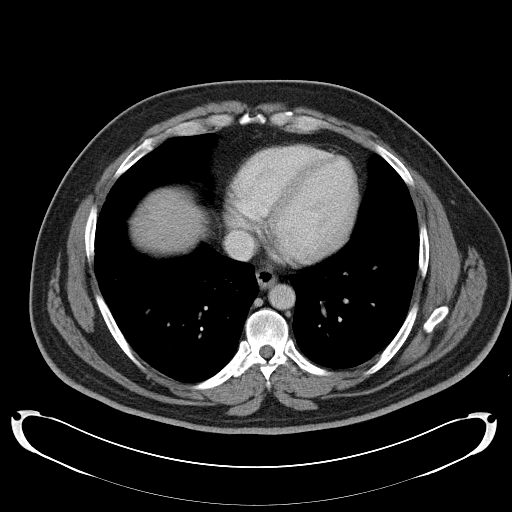
[im 95/123  soft-tissue]
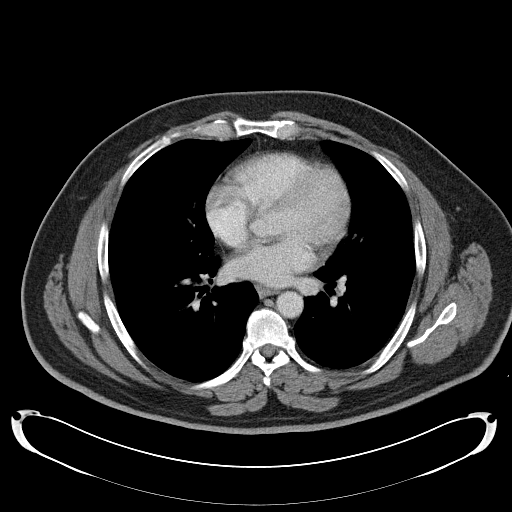
[im 109/123  soft-tissue]
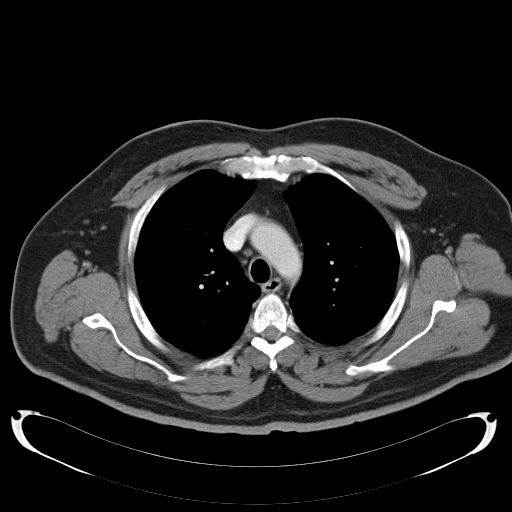
[im 116/123  soft-tissue]
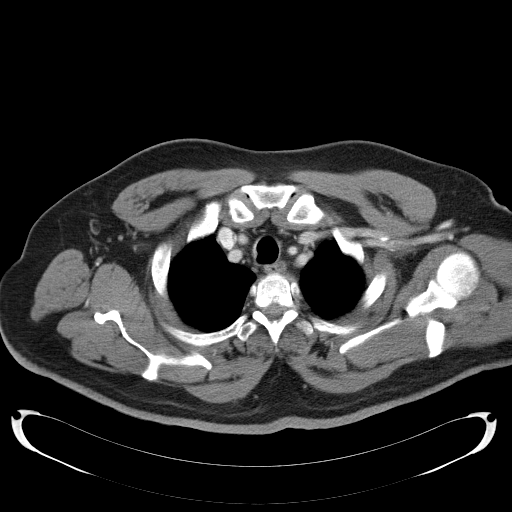

[Series 602: coronal images · coronal · 1.20mm/px · 3 of 115 slices shown]
[im 39/115  soft-tissue]
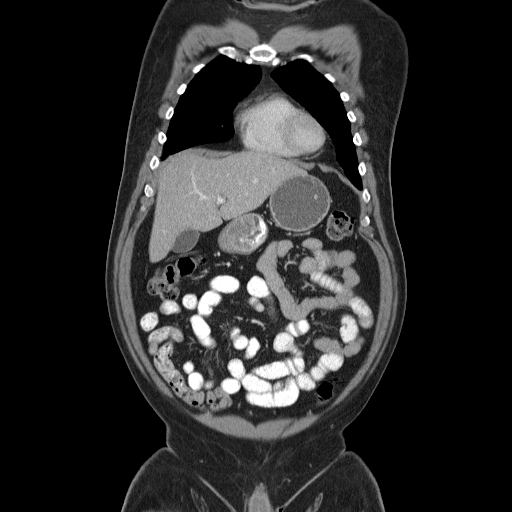
[im 51/115  soft-tissue]
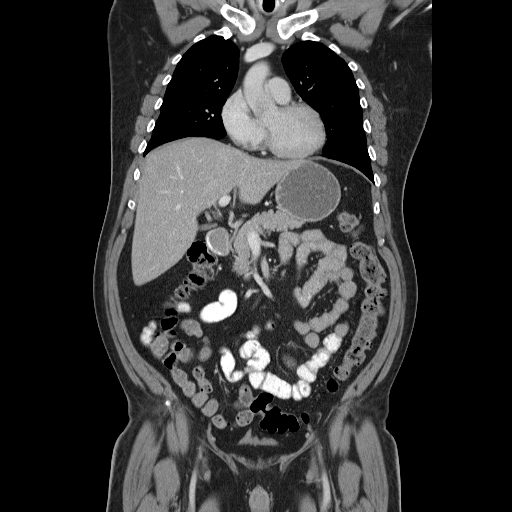
[im 64/115  soft-tissue]
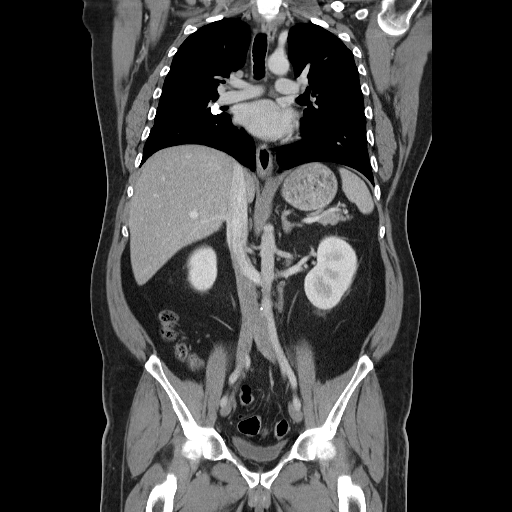

[17 of 46 positions shown; findings below may reference images not displayed]

FINDINGS: Stable nodularity along the left fissure (series
4/images 23-25), unchanged for multiple years, benign.  Calcified
granuloma in the right middle lobe (series 4/image 30).  No
new/suspicious pulmonary nodules.  No pleural effusion or
pneumothorax.

Visualized thyroid is unremarkable.

The heart is normal in size.  No pericardial effusion.

No suspicious mediastinal, hilar, or axillary lymph nodes.
Calcified right hilar/perihilar nodes (series 2/images 22 and 28).

Visualized osseous structures are within normal limits.
IMPRESSION: No evidence of lymphomatous involvement in the chest.

CT ABDOMEN AND PELVIS
FINDINGS: Liver, spleen, pancreas, and adrenal glands are within
normal limits.

Gallbladder is unremarkable.  No intrahepatic or extrahepatic
ductal dilatation.

Kidneys are within normal limits.  No hydronephrosis.

No evidence of bowel obstruction.  Normal appendix.

No evidence of abdominal aortic aneurysm.  Retroaortic left renal
vein.

Small retroperitoneal lymph nodes measuring up to 7 mm short axis
(series 2/image 65), which does not meet pathologic CT size
criteria.

Prostate is unremarkable.

Bladder is within normal limits.

Mild degenerative changes at L5-S1.
IMPRESSION: No evidence of lymphomatous involvement in the abdomen/pelvis.

## 2013-01-26 MED ORDER — IOHEXOL 300 MG/ML  SOLN
100.0000 mL | Freq: Once | INTRAMUSCULAR | Status: AC | PRN
  Administered 2013-01-26: 100 mL via INTRAVENOUS

## 2013-01-27 ENCOUNTER — Telehealth: Payer: Self-pay | Admitting: Internal Medicine

## 2013-01-27 ENCOUNTER — Encounter: Payer: Self-pay | Admitting: Internal Medicine

## 2013-01-27 ENCOUNTER — Ambulatory Visit (HOSPITAL_BASED_OUTPATIENT_CLINIC_OR_DEPARTMENT_OTHER): Payer: Self-pay | Admitting: Internal Medicine

## 2013-01-27 VITALS — BP 130/79 | HR 82 | Temp 97.9°F | Resp 20 | Ht 65.0 in | Wt 200.9 lb

## 2013-01-27 DIAGNOSIS — C8589 Other specified types of non-Hodgkin lymphoma, extranodal and solid organ sites: Secondary | ICD-10-CM

## 2013-01-27 NOTE — Patient Instructions (Signed)
No evidence for disease recurrence on the recent scan. Followup in one year with repeat CT scan of the chest, abdomen and pelvis.

## 2013-01-27 NOTE — Progress Notes (Signed)
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  2) Maintenance Rituxan at 375 mg per meter square given every 2 months status post 12 cycles   CURRENT THERAPY: Observation.  INTERVAL HISTORY: Blake Vaughn 52 y.o. male returns to the clinic today for routine annual followup visit. The patient is feeling fine today with no specific complaints. He denied having any significant chest pain, shortness breath, cough or hemoptysis. The patient denied having any significant weight loss or night sweats. He has no palpable lymphadenopathy. He had repeat CT scan of the chest, abdomen and pelvis performed recently and he is here today for evaluation and discussion of his scan results.   MEDICAL HISTORY: Past Medical History  Diagnosis Date  . Hypertension 11/27/2011  . Diabetes mellitus   . Hypercholesterolemia   . Lymphoma     gets annual chemo last tx Feb 2013  . Lymphoma     ALLERGIES:  is allergic to iohexol.  MEDICATIONS:  Current Outpatient Prescriptions  Medication Sig Dispense Refill  . omeprazole (PRILOSEC) 40 MG capsule Take 1 capsule (40 mg total) by mouth daily.  30 capsule  3  . glipiZIDE (GLUCOTROL) 10 MG tablet Take 1 tablet (10 mg total) by mouth 2 (two) times daily before a meal.  60 tablet  6  . metFORMIN (GLUCOPHAGE) 1000 MG tablet Take 1 tablet (1,000 mg total) by mouth 2 (two) times daily with a meal.  60 tablet  6   No current facility-administered medications for this visit.    REVIEW OF SYSTEMS:  A comprehensive review of systems was negative.   PHYSICAL EXAMINATION: General appearance: alert, cooperative and no distress Head: Normocephalic, without obvious  abnormality, atraumatic Neck: no adenopathy Lymph nodes: Cervical, supraclavicular, and axillary nodes normal. Resp: clear to auscultation bilaterally Cardio: regular rate and rhythm, S1, S2 normal, no murmur, click, rub or gallop GI: soft, non-tender; bowel sounds normal; no masses,  no organomegaly Extremities: extremities normal, atraumatic, no cyanosis or edema  ECOG PERFORMANCE STATUS: 0 - Asymptomatic  There were no vitals taken for this visit.  LABORATORY DATA: Lab Results  Component Value Date   WBC 14.8* 01/26/2013   HGB 15.6 01/26/2013   HCT 46.8 01/26/2013   MCV 87.2 01/26/2013   PLT 202 01/26/2013      Chemistry      Component Value Date/Time   NA 137 01/26/2013 0805   NA 134* 11/11/2012 0939   NA 141 01/23/2012 0745   K 4.6 01/26/2013 0805   K 4.4 11/11/2012 0939   K 4.5 01/23/2012 0745   CL 102 01/26/2013 0805   CL 103 11/11/2012 0939   CL 97* 01/23/2012 0745   CO2 23 01/26/2013 0805   CO2 26 11/11/2012 0939   CO2 29 01/23/2012 0745   BUN 19.5 01/26/2013 0805   BUN 16 11/11/2012 0939   BUN 11 01/23/2012 0745   CREATININE 0.9 01/26/2013 0805   CREATININE 0.80 11/11/2012 0939   CREATININE 0.59 01/09/2012 2215   GLU 206* 02/06/2009 0836      Component Value Date/Time   CALCIUM 9.8 01/26/2013 0805   CALCIUM 9.7 11/11/2012 0939   CALCIUM 9.3 01/23/2012 0745   ALKPHOS 82 01/26/2013 0805  ALKPHOS 67 11/11/2012 0939   ALKPHOS 64 01/23/2012 0745   AST 12 01/26/2013 0805   AST 14 11/11/2012 0939   AST 20 01/23/2012 0745   ALT 22 01/26/2013 0805   ALT 21 11/11/2012 0939   BILITOT 0.52 01/26/2013 0805   BILITOT 0.5 11/11/2012 0939   BILITOT 0.70 01/23/2012 0745       RADIOGRAPHIC STUDIES: Ct Chest W Contrast  01/26/2013  *RADIOLOGY REPORT*  Clinical Data:  Follow up non-Hodgkins lymphoma, rituxin ongoing  CT CHEST, ABDOMEN AND PELVIS WITH CONTRAST  Technique:  Multidetector CT imaging of the chest, abdomen and pelvis was performed following the standard protocol during bolus  administration of intravenous contrast.  Contrast: OMNIPAQUE IOHEXOL 300 MG/ML  SOLN  Comparison:  Multiple priors, most recently 01/23/2012   CT CHEST  Findings:  Stable nodularity along the left fissure (series 4/images 23-25), unchanged for multiple years, benign.  Calcified granuloma in the right middle lobe (series 4/image 30).  No new/suspicious pulmonary nodules.  No pleural effusion or pneumothorax.  Visualized thyroid is unremarkable.  The heart is normal in size.  No pericardial effusion.  No suspicious mediastinal, hilar, or axillary lymph nodes. Calcified right hilar/perihilar nodes (series 2/images 22 and 28).  Visualized osseous structures are within normal limits.  IMPRESSION: No evidence of lymphomatous involvement in the chest.   CT ABDOMEN AND PELVIS  Findings:  Liver, spleen, pancreas, and adrenal glands are within normal limits.  Gallbladder is unremarkable.  No intrahepatic or extrahepatic ductal dilatation.  Kidneys are within normal limits.  No hydronephrosis.  No evidence of bowel obstruction.  Normal appendix.  No evidence of abdominal aortic aneurysm.  Retroaortic left renal vein.  Small retroperitoneal lymph nodes measuring up to 7 mm short axis (series 2/image 65), which does not meet pathologic CT size criteria.  Prostate is unremarkable.  Bladder is within normal limits.  Mild degenerative changes at L5-S1.  IMPRESSION: No evidence of lymphomatous involvement in the abdomen/pelvis.   Original Report Authenticated By: Charline Bills, M.D.     ASSESSMENT: This is a very pleasant 52 years old Hispanic male with history of follicular lymphoma status post 6 cycles of systemic chemotherapy with CHOP/Rituxan followed by 2 years of maintenance Rituxan. He has been observation with no evidence for disease recurrence.  PLAN: I discussed the scan results with the patient today. I recommended for him to continue on observation with repeat CT scan of the chest, abdomen and pelvis in  one year.  He was advised to call immediately if he has any concerning symptoms in the interval.  All questions were answered. The patient knows to call the clinic with any problems, questions or concerns. We can certainly see the patient much sooner if necessary.

## 2013-01-31 ENCOUNTER — Telehealth: Payer: Self-pay | Admitting: Family Medicine

## 2013-01-31 NOTE — Telephone Encounter (Signed)
I received a fax from [facility] explaining that patient had been instructed to hold metformin prior to recent CT abd/pelvis on 01/26/13, and asking the patient to call the primary physician's office for instructions about when/if to restart the medication after the CT scan. I attempted to call the patient myself at the cell # in the chart.  Busy signal.  Will send letter instructing him to restart the medication has he had been taking it. Paula Compton, MD

## 2013-02-03 ENCOUNTER — Telehealth: Payer: Self-pay | Admitting: Family Medicine

## 2013-02-03 NOTE — Telephone Encounter (Signed)
I called pt and no body answer the phone not able to let a VM. MJ

## 2013-02-03 NOTE — Telephone Encounter (Signed)
Patient stopped by and is needing a refill of Glipizide and Metformin.  He uses [facility] [facility].  He says he called pharmacy and they told him that he had to contact us.

## 2013-02-03 NOTE — Telephone Encounter (Signed)
Forward to [employer] to call patient, please tell him the medications were sent to [facility] on [address], [place]

## 2013-04-08 ENCOUNTER — Ambulatory Visit (INDEPENDENT_AMBULATORY_CARE_PROVIDER_SITE_OTHER): Payer: Medicaid Other | Admitting: Family Medicine

## 2013-04-08 ENCOUNTER — Encounter: Payer: Self-pay | Admitting: Family Medicine

## 2013-04-08 ENCOUNTER — Emergency Department (HOSPITAL_COMMUNITY)
Admission: EM | Admit: 2013-04-08 | Discharge: 2013-04-08 | Discharge status: Absent without leave | Payer: Medicaid Other | Source: Home / Self Care

## 2013-04-08 VITALS — BP 131/68 | HR 80 | Temp 98.0°F | Ht 65.0 in | Wt 204.0 lb

## 2013-04-08 DIAGNOSIS — R51 Headache: Secondary | ICD-10-CM

## 2013-04-08 MED ORDER — HYDROCODONE-ACETAMINOPHEN 5-325 MG PO TABS
1.0000 | ORAL_TABLET | ORAL | Status: DC | PRN
Start: 2013-04-08 — End: 2015-02-06

## 2013-04-08 NOTE — Progress Notes (Signed)
Patient ID: Blake Vaughn, male   DOB: 01-09-1961, 52 y.o.   MRN: [id number] Subjective: The patient is a 52 y.o. year old male who presents today for headaches.  Patient reports that he has been having problems with a posterior headache that is throbbing in quality the last 3 days. Headache is relatively constant during that time. Is not associated with any lightheadedness, dizziness, visual changes, nausea, vomiting, diarrhea, or problems with coordination. Patient has been taking a small amount of Motrin which she reports helps somewhat. Patient is not have a history of particularly bad headaches.  Patient's past medical, social, and family history were reviewed and updated as appropriate. History  Substance Use Topics  . Smoking status: Former Games developer  . Smokeless tobacco: Former Neurosurgeon    Quit date: 11/20/2006  . Alcohol Use: No   Objective:  Filed Vitals:   04/08/13 1554  BP: 131/68  Pulse: 80  Temp: 98 F (36.7 C)   Gen: No acute distress HEENT: Mucous members moist, extraocular movements intact, pupils round and active to light, cranial nerves II through XII are intact bilaterally CV: Regular rate and rhythm, no murmurs appreciated Resp: Clear to auscultation bilaterally Ext: 2+ reflexes bilateral upper extremities. Strength is 5 out of 5 all extremities  Assessment/Plan: Headache, no concerning neurologic signs. No red flags on history. I do not see any indication for neurologic imaging. I give the patient an injection of Toradol along with a small number of pills of hydrocodone with Tylenol as his headache has been causing some sleep problems. You will take it tonight, try to get a good sleep, and hopefully we feeling better in the morning. As he has not had significant problems with headaches in the past, and his description of his headache is not typical for migraine, I will not give Imitrex. As he is diabetic i will not give steroids.  Please also see individual problems in  problem list for problem-specific plans.

## 2013-04-08 NOTE — Patient Instructions (Signed)
Please take 2 of the pain pills when you get home and 2 of them before you go to bed.  Try to get a good night sleep. If your headache persists into tomorrow, you can take a few more of the pain pills.  (You can take 1 or 2 every 4 hours)  If you still have a headache by Monday, come back to see [place].

## 2013-04-09 ENCOUNTER — Encounter (HOSPITAL_COMMUNITY): Payer: Self-pay | Admitting: Emergency Medicine

## 2013-04-09 ENCOUNTER — Telehealth (HOSPITAL_COMMUNITY): Payer: Self-pay | Admitting: Emergency Medicine

## 2013-04-09 ENCOUNTER — Emergency Department (HOSPITAL_COMMUNITY)
Admission: EM | Admit: 2013-04-09 | Discharge: 2013-04-09 | Disposition: A | Discharge status: Home / Self Care | Payer: Medicaid Other | Source: Home / Self Care | Attending: Emergency Medicine | Admitting: Emergency Medicine

## 2013-04-09 DIAGNOSIS — G44209 Tension-type headache, unspecified, not intractable: Secondary | ICD-10-CM

## 2013-04-09 LAB — CBC WITH DIFFERENTIAL/PLATELET
Eosinophils Absolute: 0.4 10*3/uL (ref 0.0–0.7)
Lymphocytes Relative: 26 % (ref 12–46)
Lymphs Abs: 2.2 10*3/uL (ref 0.7–4.0)
Neutrophils Relative %: 63 % (ref 43–77)
Platelets: 207 10*3/uL (ref 150–400)
RBC: 5.01 MIL/uL (ref 4.22–5.81)
WBC: 8.6 10*3/uL (ref 4.0–10.5)

## 2013-04-09 LAB — POCT I-STAT, CHEM 8
BUN: 18 mg/dL (ref 6–23)
Creatinine, Ser: 0.8 mg/dL (ref 0.50–1.35)
Hemoglobin: 15.6 g/dL (ref 13.0–17.0)
Potassium: 4.7 mEq/L (ref 3.5–5.1)
Sodium: 136 mEq/L (ref 135–145)

## 2013-04-09 MED ORDER — DICLOFENAC SODIUM 75 MG PO TBEC: 75.0000 mg | DELAYED_RELEASE_TABLET | Freq: Two times a day (BID) | ORAL | Status: DC

## 2013-04-09 MED ORDER — TIZANIDINE HCL 4 MG PO CAPS: 4.0000 mg | ORAL_CAPSULE | Freq: Three times a day (TID) | ORAL | Status: DC

## 2013-04-09 NOTE — ED Provider Notes (Signed)
Chief Complaint:   Chief Complaint  Patient presents with  . Headache    History of Present Illness:   Blake Vaughn is a 52 year old male who presents with a three-day history of an occipital headache. The pain is steady and not throbbing. It's not been associated with nausea, photophobia, or phonophobia. He's felt somewhat dizzy with the headache. He denies any fever, chills, stiff neck, nasal congestion, rhinorrhea, or sore throat. He's had no diplopia or blurred vision. He denies paresthesias, numbness, tingling, muscle weakness, difficulty with speech, swallowing, coordination, balance, or ambulation. He has no history of migraine headache. He has had similar headaches to this in the past. He was seen at the [facility] yesterday. He was given Toradol IM and sent home with hydrocodone/APAP. He states these medications have not helped.  Review of Systems:  Other than noted above, the patient denies any of the following symptoms: Systemic:  No fever, chills, fatigue, photophobia, stiff neck. Eye:  No redness, eye pain, discharge, blurred vision, or diplopia. ENT:  No nasal congestion, rhinorrhea, sinus pressure or pain, sneezing, earache, or sore throat.  No jaw claudication. Neuro:  No paresthesias, loss of consciousness, seizure activity, muscle weakness, trouble with coordination or gait, trouble speaking or swallowing. Psych:  No depression, anxiety or trouble sleeping.  PMFSH:  Past medical history, family history, social history, meds, and allergies were reviewed.  He has diabetes and takes metformin. He had non-Hodgkin's lymphoma 5 years ago, but has been cancer free since then after receiving chemotherapy.  Physical Exam:   Vital signs:  BP 127/76  Pulse 74  Temp(Src) 98.2 F (36.8 C) (Oral)  Resp 18  SpO2 98% General:  Alert and oriented.  In no distress. Eye:  Lids and conjunctivas normal.  PERRL,  Full EOMs.  Fundi benign with normal discs and vessels. ENT:  No  cranial or facial tenderness to palpation.  TMs and canals clear.  Nasal mucosa was normal and uncongested without any drainage. No intra oral lesions, pharynx clear, mucous membranes moist, dentition normal. Neck:  Supple, full ROM, no tenderness to palpation.  No adenopathy or mass. Neuro:  Alert and orented times 3.  Speech was clear, fluent, and appropriate.  Cranial nerves intact. No pronator drift, muscle strength normal. Finger to nose normal.  DTRs were 2+ and symmetrical.Station and gait were normal.  Romberg's sign was normal.  Able to perform tandem gait well. Psych:  Normal affect.  Results for orders placed during the hospital encounter of 04/09/13  CBC WITH DIFFERENTIAL      Result Value Range   WBC 8.6  4.0 - 10.5 K/uL   RBC 5.01  4.22 - 5.81 MIL/uL   Hemoglobin 15.3  13.0 - 17.0 g/dL   HCT 40.1  02.7 - 25.3 %   MCV 84.8  78.0 - 100.0 fL   MCH 30.5  26.0 - 34.0 pg   MCHC 36.0  30.0 - 36.0 g/dL   RDW 66.4  40.3 - 47.4 %   Platelets 207  150 - 400 K/uL   Neutrophils Relative 63  43 - 77 %   Neutro Abs 5.4  1.7 - 7.7 K/uL   Lymphocytes Relative 26  12 - 46 %   Lymphs Abs 2.2  0.7 - 4.0 K/uL   Monocytes Relative 7  3 - 12 %   Monocytes Absolute 0.6  0.1 - 1.0 K/uL   Eosinophils Relative 4  0 - 5 %   Eosinophils Absolute 0.4  0.0 - 0.7 K/uL   Basophils Relative 0  0 - 1 %   Basophils Absolute 0.0  0.0 - 0.1 K/uL  POCT I-STAT, CHEM 8      Result Value Range   Sodium 136  135 - 145 mEq/L   Potassium 4.7  3.5 - 5.1 mEq/L   Chloride 102  96 - 112 mEq/L   BUN 18  6 - 23 mg/dL   Creatinine, Ser 1.61  0.50 - 1.35 mg/dL   Glucose, Bld 096 (*) 70 - 99 mg/dL   Calcium, Ion 0.45 (*) 1.12 - 1.23 mmol/L   TCO2 26  0 - 100 mmol/L   Hemoglobin 15.6  13.0 - 17.0 g/dL   HCT 40.9  81.1 - 91.4 %   Assessment:  The encounter diagnosis was Tension type headache.  Plan:   1.  The following meds were prescribed:   Discharge Medication List as of 04/09/2013  7:05 PM    START taking  these medications   Details  diclofenac (VOLTAREN) 75 MG EC tablet Take 1 tablet (75 mg total) by mouth 2 (two) times daily., Starting 04/09/2013, Until Discontinued, Normal    tiZANidine (ZANAFLEX) 4 MG capsule Take 1 capsule (4 mg total) by mouth 3 (three) times daily., Starting 04/09/2013, Until Discontinued, Normal       2.  The patient was instructed in symptomatic care and handouts were given. 3.  The patient was told to return if becoming worse in any way, if no better in 3 or 4 days, and given some red flag symptoms such as fever, worsening headache, or neurological symptoms that would indicate earlier return. 4.  Follow up with his primary care physician at the [facility] early next week. He already has an appointment to be seen next Tuesday.    Reuben Likes, MD 04/09/13 2016

## 2013-04-09 NOTE — ED Notes (Signed)
Pt c/o persitent headaceh onset 3 days Sx also include: dizziness, throbbing pain, and occasional numbness of hands Denies: f/v/d/weakness Seen at [facility] yest and was given IM inj for pain that he does not recall and pain meds  He is alert and oriented w/no signs of acute distress.

## 2013-04-09 NOTE — ED Notes (Signed)
Fax received requesting pre-authorization for tizanidine HCL 4mg  capsules.  Pharmacy was called.  They stated they could switch it to the tablets and medicaid would pay for the prescription.  Dr Lorenz Coaster made aware and change was made

## 2013-09-11 ENCOUNTER — Encounter (HOSPITAL_COMMUNITY): Payer: Self-pay | Admitting: *Deleted

## 2013-09-11 ENCOUNTER — Emergency Department (HOSPITAL_COMMUNITY)
Admission: EM | Admit: 2013-09-11 | Discharge: 2013-09-11 | Disposition: A | Discharge status: Home / Self Care | Payer: Medicaid Other | Source: Home / Self Care | Attending: Emergency Medicine | Admitting: Emergency Medicine

## 2013-09-11 DIAGNOSIS — R51 Headache: Secondary | ICD-10-CM

## 2013-09-11 MED ORDER — DICLOFENAC SODIUM 75 MG PO TBEC: 75.0000 mg | DELAYED_RELEASE_TABLET | Freq: Two times a day (BID) | ORAL | Status: DC

## 2013-09-11 MED ORDER — KETOROLAC TROMETHAMINE 60 MG/2ML IM SOLN
30.0000 mg | Freq: Once | INTRAMUSCULAR | Status: AC
  Administered 2013-09-11: 30 mg via INTRAMUSCULAR

## 2013-09-11 MED ORDER — SUMATRIPTAN SUCCINATE 6 MG/0.5ML ~~LOC~~ SOLN
6.0000 mg | Freq: Once | SUBCUTANEOUS | Status: AC
  Administered 2013-09-11: 6 mg via SUBCUTANEOUS

## 2013-09-11 MED ORDER — SUMATRIPTAN SUCCINATE 6 MG/0.5ML ~~LOC~~ SOLN
SUBCUTANEOUS | Status: AC
  Filled 2013-09-11: qty 0.5

## 2013-09-11 MED ORDER — TIZANIDINE HCL 4 MG PO TABS: 4.0000 mg | ORAL_TABLET | Freq: Four times a day (QID) | ORAL | Status: DC | PRN

## 2013-09-11 MED ORDER — METOCLOPRAMIDE HCL 5 MG/ML IJ SOLN
INTRAMUSCULAR | Status: AC
  Filled 2013-09-11: qty 2

## 2013-09-11 MED ORDER — METOCLOPRAMIDE HCL 5 MG/ML IJ SOLN
10.0000 mg | Freq: Once | INTRAMUSCULAR | Status: AC
  Administered 2013-09-11: 10 mg via INTRAMUSCULAR

## 2013-09-11 MED ORDER — KETOROLAC TROMETHAMINE 30 MG/ML IJ SOLN
INTRAMUSCULAR | Status: AC
  Filled 2013-09-11: qty 1

## 2013-09-11 NOTE — ED Notes (Signed)
Patient complains of head ache that started last Friday night; pt states he took Advil for the pain with no relief; denies nausea and vomiting some dizziness.

## 2013-09-11 NOTE — ED Provider Notes (Signed)
Chief Complaint:   Chief Complaint  Patient presents with  . Headache    History of Present Illness:   Blake Vaughn is a 52 year old male who has had a three-day history of a generalized, global headache. This is not throbbing. Patient states is not the worst headache of his life. It came on gradually. There was no obvious precipitating factor. It's not been associated with nausea, photophobia, or phonophobia. He denies any diplopia or blurred vision. No nasal congestion, rhinorrhea, sore throat, or stiff neck. He's had no paresthesias, weakness, difficulty with speech, swallowing, ambulation, coordination, balance, or equilibrium. He has had headaches like this before. He's been seen here and at the [facility].  Review of Systems:  Other than noted above, the patient denies any of the following symptoms: Systemic:  No fever, chills, fatigue, photophobia, stiff neck. Eye:  No redness, eye pain, discharge, blurred vision, or diplopia. ENT:  No nasal congestion, rhinorrhea, sinus pressure or pain, sneezing, earache, or sore throat.  No jaw claudication. Neuro:  No paresthesias, loss of consciousness, seizure activity, muscle weakness, trouble with coordination or gait, trouble speaking or swallowing. Psych:  No depression, anxiety or trouble sleeping.  PMFSH:  Past medical history, family history, social history, meds, and allergies were reviewed.  He has diabetes for which he takes metformin.  Physical Exam:   Vital signs:  BP 137/84  Pulse 94  Temp(Src) 98.5 F (36.9 C) (Oral)  Resp 16 General:  Alert and oriented.  In no distress. Eye:  Lids and conjunctivas normal.  PERRL,  Full EOMs.  Fundi benign with normal discs and vessels. ENT:  No cranial or facial tenderness to palpation.  TMs and canals clear.  Nasal mucosa was normal and uncongested without any drainage. No intra oral lesions, pharynx clear, mucous membranes moist, dentition normal. Neck:  Supple, full ROM, no  tenderness to palpation.  No adenopathy or mass. Neuro:  Alert and orented times 3.  Speech was clear, fluent, and appropriate.  Cranial nerves intact. No pronator drift, muscle strength normal. Finger to nose normal.  DTRs were 2+ and symmetrical.Station and gait were normal.  Romberg's sign was normal.  Able to perform tandem gait well. Psych:  Normal affect.  Course in Urgent Care Center:   He was given Toradol 30 mg IM, Reglan 10 mg IM, and Imitrex 6 mg IM.  Assessment:  The encounter diagnosis was Headache.  Differential diagnosis is a severe muscle contraction headache versus migraine.  Plan:   1.  Meds:  The following meds were prescribed:   Discharge Medication List as of 09/11/2013  5:00 PM     He was given prescriptions for tizanidine 4 mg #30 one every 8 hours as needed for headache and diclofenac 75 mg #30 one every 12 hours as needed.  2.  Patient Education/Counseling:  The patient was given appropriate handouts, self care instructions, and instructed in symptomatic relief.    3.  Follow up:  The patient was told to follow up if no better in 3 to 4 days, if becoming worse in any way, and given some red flag symptoms such as fever, stiff neck, or new neurological symptoms which would prompt immediate return.  Follow up here or with his primary care physician as needed.     Reuben Likes, MD 09/11/13 [phone]

## 2014-01-06 ENCOUNTER — Ambulatory Visit (INDEPENDENT_AMBULATORY_CARE_PROVIDER_SITE_OTHER): Payer: Medicaid Other | Admitting: Family Medicine

## 2014-01-06 ENCOUNTER — Encounter: Payer: Self-pay | Admitting: Family Medicine

## 2014-01-06 VITALS — BP 133/78 | HR 97 | Temp 98.3°F | Ht 65.0 in | Wt 199.0 lb

## 2014-01-06 DIAGNOSIS — E1165 Type 2 diabetes mellitus with hyperglycemia: Secondary | ICD-10-CM

## 2014-01-06 DIAGNOSIS — A088 Other specified intestinal infections: Secondary | ICD-10-CM

## 2014-01-06 DIAGNOSIS — IMO0001 Reserved for inherently not codable concepts without codable children: Secondary | ICD-10-CM

## 2014-01-06 DIAGNOSIS — R109 Unspecified abdominal pain: Secondary | ICD-10-CM

## 2014-01-06 DIAGNOSIS — A084 Viral intestinal infection, unspecified: Secondary | ICD-10-CM | POA: Insufficient documentation

## 2014-01-06 MED ORDER — OMEPRAZOLE 40 MG PO CPDR: 40.0000 mg | DELAYED_RELEASE_CAPSULE | Freq: Every day | ORAL | Status: DC

## 2014-01-06 MED ORDER — METFORMIN HCL 1000 MG PO TABS: 1000.0000 mg | ORAL_TABLET | Freq: Two times a day (BID) | ORAL | Status: DC

## 2014-01-06 MED ORDER — GLIPIZIDE 10 MG PO TABS: 10.0000 mg | ORAL_TABLET | Freq: Two times a day (BID) | ORAL | Status: DC

## 2014-01-06 NOTE — Progress Notes (Signed)
Family Medicine Office Visit Note   Subjective:   Patient ID: Blake Vaughn, male  DOB: February 09, 1961, 53 y.o.. MRN: [id number]   Pt that comes today for same day appointment complaining of upper respiratory symptoms for about 1 week as well as diarrhea and stomach discomfort. He has PMHx significant for non-Hodgkin Lymphoma treated with chemo in the past and f/u by Oncology with a upcoming  CT scan in Feb 26th.   His present symptoms started with URI symptoms of nasal congestion that have progressed to non productive sporadic cough. At the same time he started to have some frequent BM mostly after eating and also intermittent vomiting. In the past 24 h his symptoms have improved but he continues having mild abdominal discomfort. Denies blood on stool or vomit.  Pt reports mild headaches that self resolve associated to his current symptoms.  Pt's appetite is good despite his present symptoms, and is able to keep himself well hydrated.   He has also DM and is requesting metformin and glipizide refills. Denies hypoglycemic events or hyperglycemic symptoms. No noticeable side effects of this meds. No recent A1C. Last was 11/2012. Pt declines A1C testing today.   Review of Systems:  Per HPI. Also denies SOB, chest pain, nausea, dysuria, dizziness or focalized weakness.   Objective:   Physical Exam: Gen:  NAD HEENT: Moist mucous membranes Oropharynx: no erythema or exudates. Neck: supple, no meningismus, no adenopathies.  CV: Regular rate and rhythm, no murmurs  PULM: Clear to auscultation bilaterally. No wheezes/rales/rhonchi ABD: no distended, normal BS. Mildly tender to palpation in epigastrium without guarding or rebound tenderness.  EXT: No edema Neuro: Alert and oriented x3. No focalization  Assessment & Plan:

## 2014-01-06 NOTE — Assessment & Plan Note (Signed)
Last A1C check was 13 months ago. Declines to check it today. Asymptomatic from hypo/hyperglycemia.  Glipizide and Metformin refilled. Instructed to make appointment for DM proper f/u

## 2014-01-06 NOTE — Patient Instructions (Addendum)
Gastroenteritis viral (Viral Gastroenteritis) La gastroenteritis viral tambin es conocida como gripe del [place]. Este trastorno [employer] y el tubo digestivo. Puede causar diarrea y vmitos repentinos. La enfermedad generalmente dura entre 3 y 8 das. La [employer] de las personas desarrolla una respuesta inmunolgica. Con el tiempo, esto elimina el virus. Mientras se desarrolla esta respuesta natural, el virus puede afectar en forma importante su salud.  CAUSAS Muchos virus diferentes pueden causar gastroenteritis, por ejemplo el rotavirus o el norovirus. Estos virus pueden contagiarse al consumir alimentos o agua contaminados. Tambin puede contagiarse al compartir utensilios u otros artculos personales con una persona infectada o al tocar una superficie contaminada.  SNTOMAS Los sntomas ms comunes son diarrea y vmitos. Estos problemas pueden causar una prdida grave de lquidos corporales(deshidratacin) y un desequilibrio de sales corporales(electrolitos). Otros sntomas pueden ser:   Cristy Hilts.  Dolor de [place].  Fatiga.  Dolor abdominal. DIAGNSTICO  El mdico podr hacer el diagnstico de gastroenteritis viral basndose en los sntomas y el examen fsico Tambin pueden tomarle una muestra de materia fecal para diagnosticar la presencia de virus u otras infecciones.  TRATAMIENTO Esta enfermedad generalmente desaparece sin tratamiento. Los tratamientos estn dirigidos a Neurosurgeon. Los casos ms graves de gastroenteritis viral implican vmitos tan intensos que no es posible retener lquidos. En [employer], los lquidos deben administrarse a travs de una va intravenosa (IV).  INSTRUCCIONES PARA EL CUIDADO DOMICILIARIO  Beba suficientes lquidos para mantener la orina clara o de color amarillo plido. Beba pequeas cantidades de lquido con frecuencia y aumente la cantidad segn la tolerancia.   Evite:    Alimentos que Education officer, museum.  Alcohol.  Gaseosas.  Tabaco[place]den Emms.  Bebidas con cafena.  Lquidos muy calientes o fros.  Alimentos muy grasos.  Comer demasiado a Radiographer, therapeutic.  Productos lcteos hasta 24 a 48 horas despus de que se detenga la diarrea.  Puede consumir probiticos. Los probiticos son cultivos activos de bacterias beneficiosas. Pueden disminuir la cantidad y el nmero de deposiciones diarreicas en el adulto. Se encuentran en los yogures con cultivos activos y en los suplementos.  Lave bien sus manos para evitar que se disemine el virus.  Slo tome medicamentos de venta libre o recetados para Glass blower/designer, las molestias o bajar la fiebre segn las indicaciones de su mdico. No administre aspirina a los nios. Los medicamentos antidiarreicos no son recomendables.  Consulte a su mdico si puede seguir tomando sus medicamentos recetados o de [employer].  Cumpla con todas las visitas de control, segn le indique su mdico. SOLICITE ATENCIN MDICA DE INMEDIATO SI:  No puede retener lquidos.  Le falta el aire.  Observa sangre en el vmito (se ve como caf molido) o en la materia fecal.  Siente dolor abdominal que empeora o se concentra en una zona pequea (se localiza).  Tiene nuseas o vmitos persistentes.  Tiene fiebre.  Regrese en 72 h o antes si sus sintomas no han mejorado o otros sintomas aparecen.

## 2014-01-06 NOTE — Assessment & Plan Note (Signed)
Most likely with his HPI and today's exam.  Instructed to continue good hydration and symptomatic treatment.  Close f/u if symptoms do not improve, worsens or new symptoms appear.

## 2014-01-30 ENCOUNTER — Encounter (HOSPITAL_COMMUNITY): Payer: Self-pay

## 2014-01-30 ENCOUNTER — Other Ambulatory Visit (HOSPITAL_BASED_OUTPATIENT_CLINIC_OR_DEPARTMENT_OTHER): Payer: Medicaid Other

## 2014-01-30 ENCOUNTER — Ambulatory Visit (HOSPITAL_COMMUNITY)
Admission: RE | Admit: 2014-01-30 | Discharge: 2014-01-30 | Disposition: A | Discharge status: Home / Self Care | Payer: Medicaid Other | Source: Ambulatory Visit | Attending: Internal Medicine | Admitting: Internal Medicine

## 2014-01-30 DIAGNOSIS — Z87898 Personal history of other specified conditions: Secondary | ICD-10-CM | POA: Insufficient documentation

## 2014-01-30 DIAGNOSIS — R935 Abnormal findings on diagnostic imaging of other abdominal regions, including retroperitoneum: Secondary | ICD-10-CM | POA: Insufficient documentation

## 2014-01-30 DIAGNOSIS — C8589 Other specified types of non-Hodgkin lymphoma, extranodal and solid organ sites: Secondary | ICD-10-CM

## 2014-01-30 LAB — COMPREHENSIVE METABOLIC PANEL
ALK PHOS: 95 U/L (ref 39–117)
ALT: 25 U/L (ref 0–53)
AST: 16 U/L (ref 0–37)
Albumin: 3.9 g/dL (ref 3.5–5.2)
BUN: 14 mg/dL (ref 6–23)
CO2: 23 mEq/L (ref 19–32)
CREATININE: 0.7 mg/dL (ref 0.50–1.35)
Calcium: 9.7 mg/dL (ref 8.4–10.5)
Chloride: 98 mEq/L (ref 96–112)
Glucose, Bld: 287 mg/dL — ABNORMAL HIGH (ref 70–99)
Potassium: 4.4 mEq/L (ref 3.5–5.3)
Sodium: 134 mEq/L — ABNORMAL LOW (ref 135–145)
Total Bilirubin: 0.4 mg/dL (ref 0.3–1.2)
Total Protein: 7.1 g/dL (ref 6.0–8.3)

## 2014-01-30 LAB — CBC WITH DIFFERENTIAL/PLATELET
BASO%: 0.2 % (ref 0.0–2.0)
Basophils Absolute: 0 10*3/uL (ref 0.0–0.1)
EOS%: 0.1 % (ref 0.0–7.0)
Eosinophils Absolute: 0 10*3/uL (ref 0.0–0.5)
HEMATOCRIT: 45.7 % (ref 38.4–49.9)
HGB: 15.8 g/dL (ref 13.0–17.1)
LYMPH#: 1.1 10*3/uL (ref 0.9–3.3)
LYMPH%: 10 % — AB (ref 14.0–49.0)
MCH: 30.3 pg (ref 27.2–33.4)
MCHC: 34.5 g/dL (ref 32.0–36.0)
MCV: 87.7 fL (ref 79.3–98.0)
MONO#: 0.4 10*3/uL (ref 0.1–0.9)
MONO%: 3.6 % (ref 0.0–14.0)
NEUT#: 9.4 10*3/uL — ABNORMAL HIGH (ref 1.5–6.5)
NEUT%: 86.1 % — AB (ref 39.0–75.0)
PLATELETS: 217 10*3/uL (ref 140–400)
RBC: 5.21 10*6/uL (ref 4.20–5.82)
RDW: 13 % (ref 11.0–14.6)
WBC: 11 10*3/uL — AB (ref 4.0–10.3)

## 2014-01-30 LAB — LACTATE DEHYDROGENASE (CC13): LDH: 209 U/L (ref 125–245)

## 2014-01-30 IMAGING — CT CT CHEST W/ CM
2 of 4 series · 17 of 46 positions shown, 19 images · IV contrast (OMNIPAQUE)
Comparison: CT ABD/PELVIS W CM dated [DATE]

CLINICAL DATA: Lymphoma.

EXAM:
CT CHEST, ABDOMEN, AND PELVIS WITH CONTRAST
TECHNIQUE: Multidetector CT imaging of the chest, abdomen and pelvis was
performed following the standard protocol during bolus
administration of intravenous contrast.
CONTRAST:  100mL OMNIPAQUE IOHEXOL 300 MG/ML  SOLN

[Series 2: cap with st · axial · 0.79mm/px · z∈[-441,+104]mm · 14 of 119 slices shown, 16 images]
[im 5/119  soft-tissue]
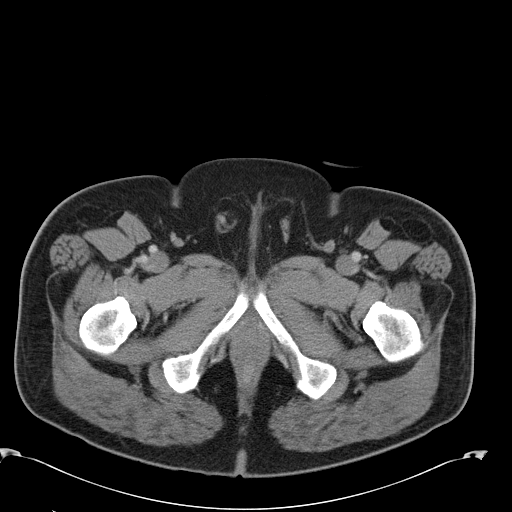
[im 5/119  bone]
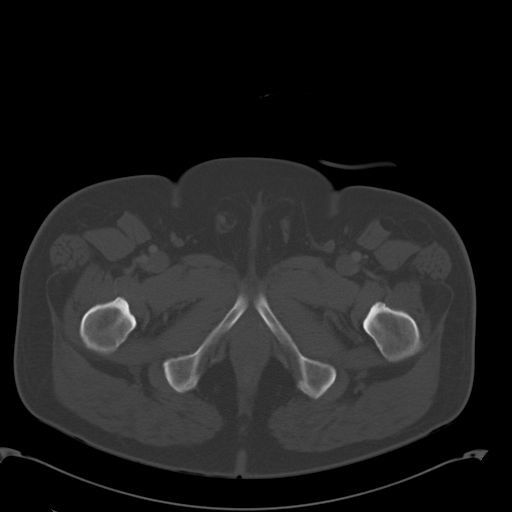
[im 15/119  soft-tissue]
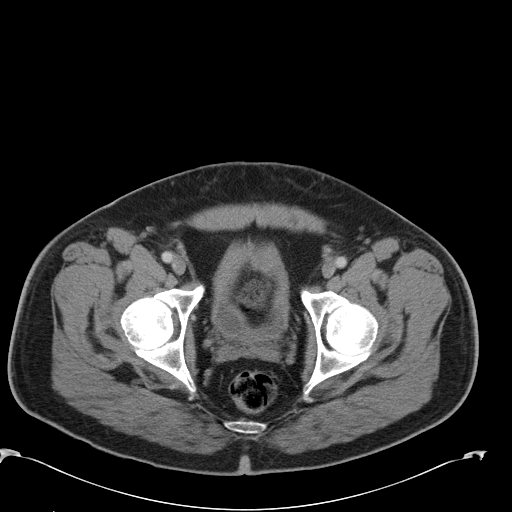
[im 25/119  soft-tissue]
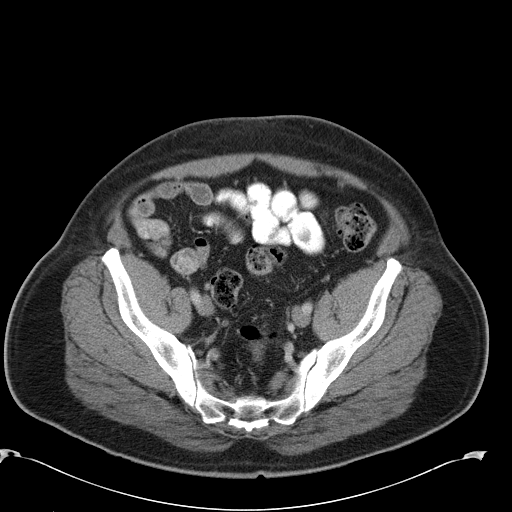
[im 30/119  soft-tissue]
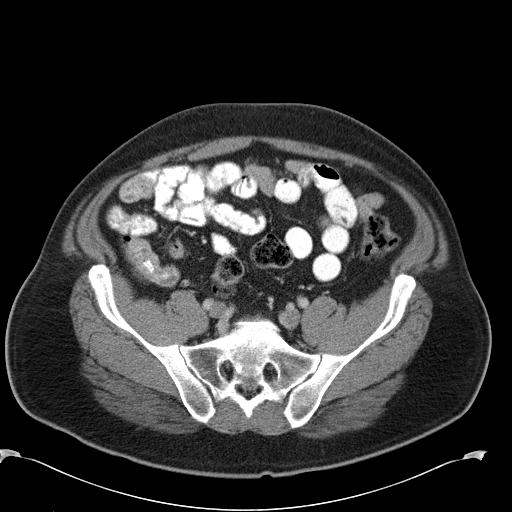
[im 40/119  soft-tissue]
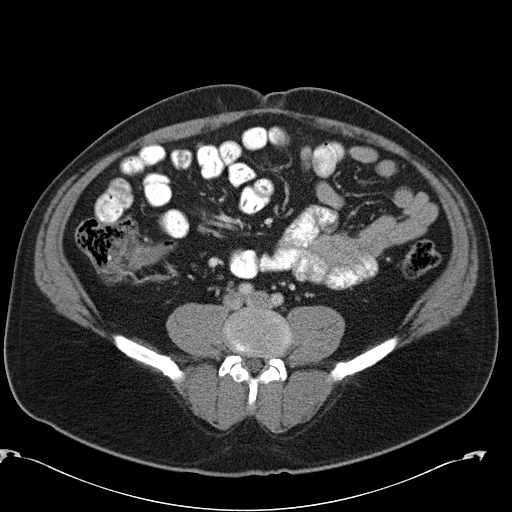
[im 50/119  soft-tissue]
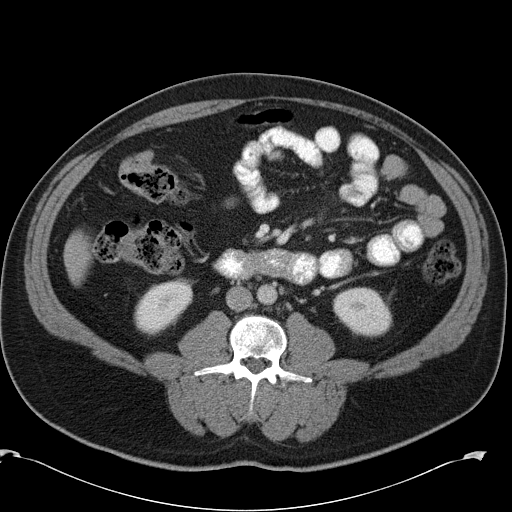
[im 55/119  soft-tissue]
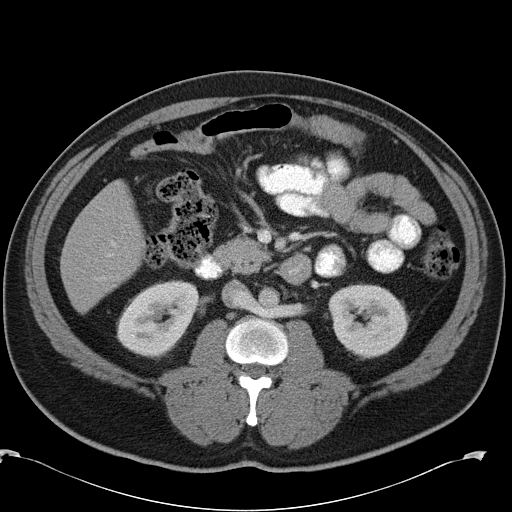
[im 64/119  soft-tissue]
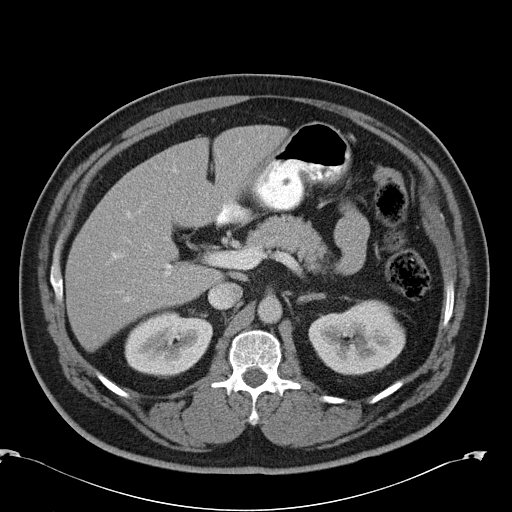
[im 69/119  soft-tissue]
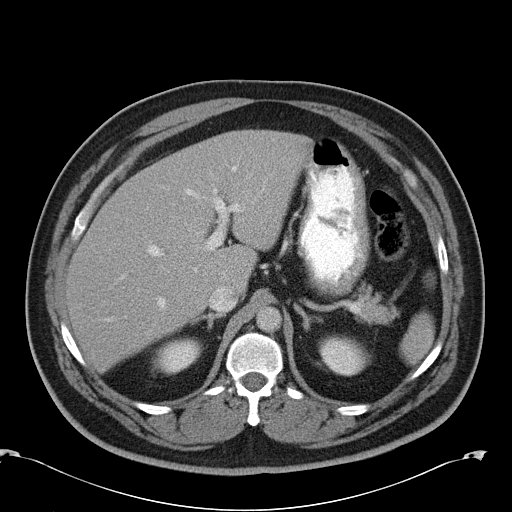
[im 69/119  bone]
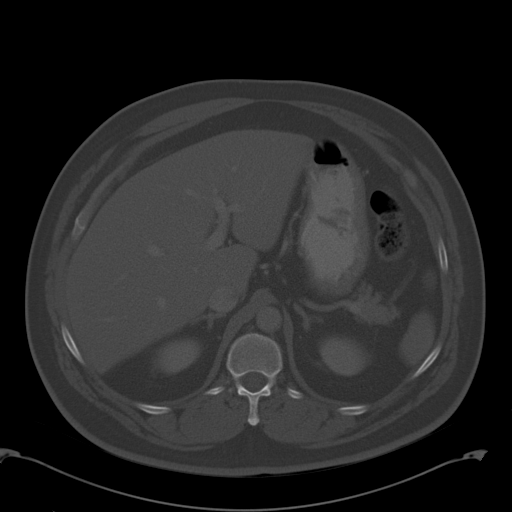
[im 79/119  soft-tissue]
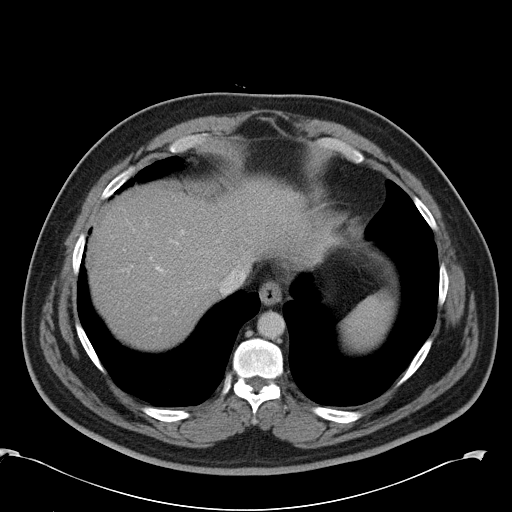
[im 89/119  soft-tissue]
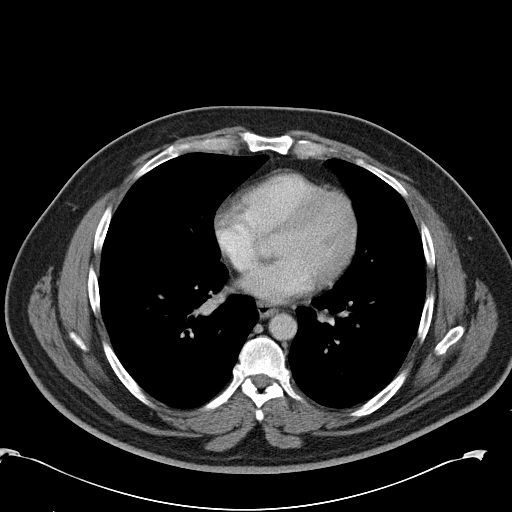
[im 94/119  soft-tissue]
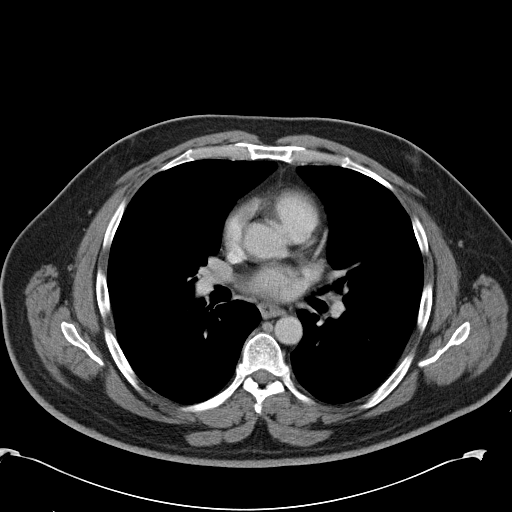
[im 104/119  soft-tissue]
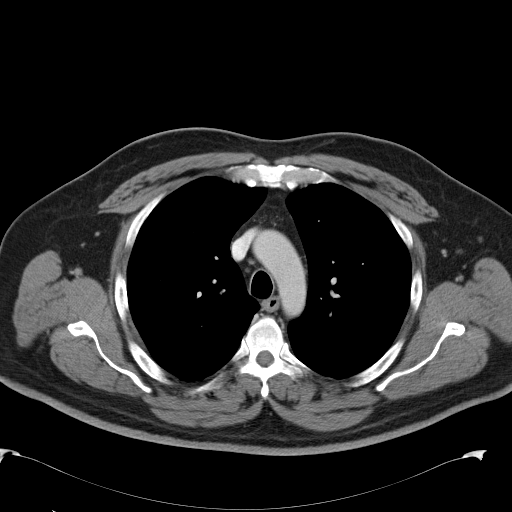
[im 114/119  soft-tissue]
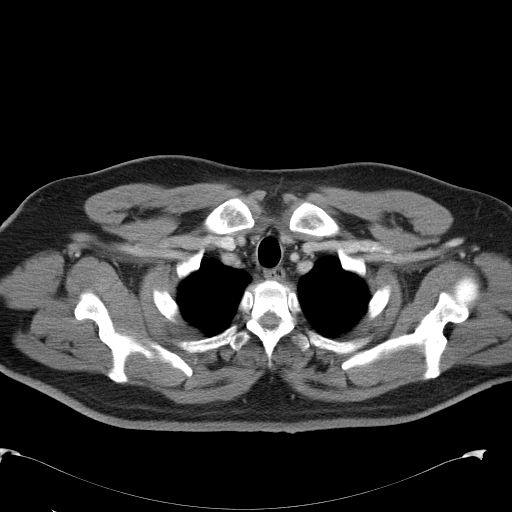

[Series 602: <mpr thick range> · coronal · 1.16mm/px · 3 of 107 slices shown]
[im 36/107  soft-tissue]
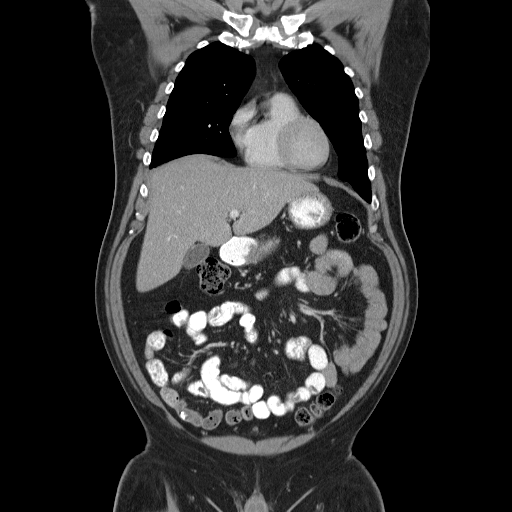
[im 48/107  soft-tissue]
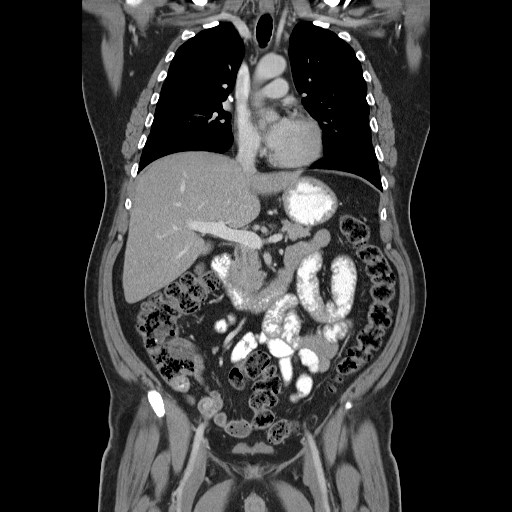
[im 59/107  soft-tissue]
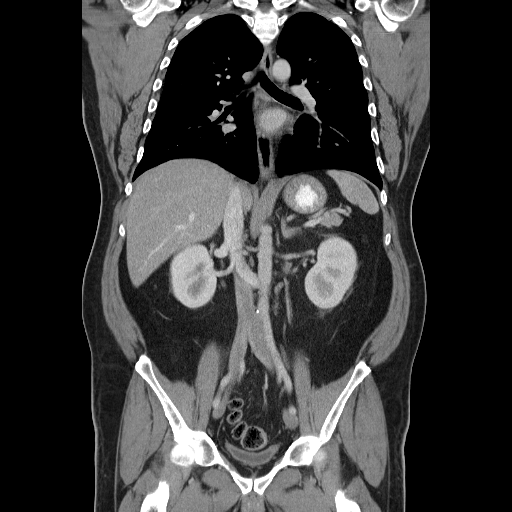

[17 of 46 positions shown; findings below may reference images not displayed]

FINDINGS: CT CHEST FINDINGS

No pathologically enlarged mediastinal, hilar or axillary lymph
nodes. Calcified right hilar lymph nodes. Left circumflex coronary
artery calcification. Heart size normal. No pericardial effusion.

Calcified granuloma in the right middle lobe. Smudgy nodules along
the left major fissure are unchanged and indicative of subpleural
lymph nodes. No pleural fluid. Airway is unremarkable.

CT ABDOMEN AND PELVIS FINDINGS

Liver, gallbladder, adrenal glands, kidneys, spleen, pancreas,
stomach and bowel are unremarkable. Appendix is normal. No
pathologically enlarged lymph nodes. Retroaortic left renal vein.
Minimal scattered atherosclerotic calcification of the arterial
vasculature without abdominal aortic aneurysm. No worrisome lytic or
sclerotic lesions.
IMPRESSION: No evidence of recurrent lymphoma in the chest, abdomen or pelvis.

## 2014-01-30 MED ORDER — IOHEXOL 300 MG/ML  SOLN
100.0000 mL | Freq: Once | INTRAMUSCULAR | Status: AC | PRN
  Administered 2014-01-30: 100 mL via INTRAVENOUS

## 2014-02-01 ENCOUNTER — Encounter: Payer: Self-pay | Admitting: Internal Medicine

## 2014-02-01 ENCOUNTER — Ambulatory Visit (HOSPITAL_BASED_OUTPATIENT_CLINIC_OR_DEPARTMENT_OTHER): Payer: Medicaid Other | Admitting: Internal Medicine

## 2014-02-01 ENCOUNTER — Telehealth: Payer: Self-pay | Admitting: Internal Medicine

## 2014-02-01 VITALS — BP 135/73 | HR 74 | Temp 97.8°F | Resp 18 | Ht 65.0 in | Wt 203.6 lb

## 2014-02-01 DIAGNOSIS — C8589 Other specified types of non-Hodgkin lymphoma, extranodal and solid organ sites: Secondary | ICD-10-CM

## 2014-02-01 NOTE — Progress Notes (Signed)
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  2) Maintenance Rituxan at 375 mg per meter square given every 2 months status post 12 cycles   CURRENT THERAPY: Observation.  INTERVAL HISTORY: Blake Vaughn 53 y.o. male returns to the clinic today for routine annual followup visit. The patient is feeling fine today with no specific complaints except for poorly controlled diabetes. He is currently on Glucotrol and metformin by his primary care physician. He denied having any significant chest pain, shortness of breath, cough or hemoptysis. The patient denied having any significant weight loss or night sweats. He has no palpable lymphadenopathy. He had repeat CT scan of the chest, abdomen and pelvis performed recently and he is here today for evaluation and discussion of his scan results.   MEDICAL HISTORY: Past Medical History  Diagnosis Date  . Hypertension 11/27/2011  . Diabetes mellitus   . Hypercholesterolemia   . Lymphoma     gets annual chemo last tx Feb 2013  . Lymphoma     ALLERGIES:  is allergic to iohexol.  MEDICATIONS:  Current Outpatient Prescriptions  Medication Sig Dispense Refill  . diclofenac (VOLTAREN) 75 MG EC tablet Take 1 tablet (75 mg total) by mouth 2 (two) times daily.  20 tablet  0  . diclofenac (VOLTAREN) 75 MG EC tablet Take 1 tablet (75 mg total) by mouth 2 (two) times daily.  20 tablet  0  . glipiZIDE (GLUCOTROL) 10 MG tablet Take 1 tablet (10 mg total) by mouth 2 (two) times daily before a meal.  60 tablet  6  . HYDROcodone-acetaminophen (NORCO) 5-325 MG per tablet Take 1-2 tablets by mouth every 4 (four) hours as needed for pain.  30 tablet  0  . metFORMIN  (GLUCOPHAGE) 1000 MG tablet Take 1 tablet (1,000 mg total) by mouth 2 (two) times daily with a meal.  60 tablet  6  . omeprazole (PRILOSEC) 40 MG capsule Take 1 capsule (40 mg total) by mouth daily.  30 capsule  3  . tiZANidine (ZANAFLEX) 4 MG capsule Take 1 capsule (4 mg total) by mouth 3 (three) times daily.  30 capsule  0  . tiZANidine (ZANAFLEX) 4 MG tablet Take 1 tablet (4 mg total) by mouth every 6 (six) hours as needed.  30 tablet  0   No current facility-administered medications for this visit.    REVIEW OF SYSTEMS:  A comprehensive review of systems was negative.   PHYSICAL EXAMINATION: General appearance: alert, cooperative and no distress Head: Normocephalic, without obvious abnormality, atraumatic Neck: no adenopathy Lymph nodes: Cervical, supraclavicular, and axillary nodes normal. Resp: clear to auscultation bilaterally Cardio: regular rate and rhythm, S1, S2 normal, no murmur, click, rub or gallop GI: soft, non-tender; bowel sounds normal; no masses,  no organomegaly Extremities: extremities normal, atraumatic, no cyanosis or edema  ECOG PERFORMANCE STATUS: 0 - Asymptomatic  Blood pressure 135/73, pulse 74, temperature 97.8 F (36.6 C), temperature source Oral, resp. rate 18, height 5\' 5"  (1.651 m), weight 203 lb 9.6 oz (92.352 kg).  LABORATORY DATA: Lab Results  Component Value Date   WBC 11.0* 01/30/2014   HGB 15.8 01/30/2014   HCT 45.7 01/30/2014   MCV 87.7 01/30/2014  PLT 217 01/30/2014      Chemistry      Component Value Date/Time   NA 134* 01/30/2014 0839   NA 137 01/26/2013 0805   NA 141 01/23/2012 0745   K 4.4 01/30/2014 0839   K 4.6 01/26/2013 0805   K 4.5 01/23/2012 0745   CL 98 01/30/2014 0839   CL 102 01/26/2013 0805   CL 97* 01/23/2012 0745   CO2 23 01/30/2014 0839   CO2 23 01/26/2013 0805   CO2 29 01/23/2012 0745   BUN 14 01/30/2014 0839   BUN 19.5 01/26/2013 0805   BUN 11 01/23/2012 0745   CREATININE 0.70 01/30/2014 0839   CREATININE 0.9 01/26/2013 0805     CREATININE 0.80 11/11/2012 0939   GLU 206* 02/06/2009 0836      Component Value Date/Time   CALCIUM 9.7 01/30/2014 0839   CALCIUM 9.8 01/26/2013 0805   CALCIUM 9.3 01/23/2012 0745   ALKPHOS 95 01/30/2014 0839   ALKPHOS 82 01/26/2013 0805   ALKPHOS 64 01/23/2012 0745   AST 16 01/30/2014 0839   AST 12 01/26/2013 0805   AST 20 01/23/2012 0745   ALT 25 01/30/2014 0839   ALT 22 01/26/2013 0805   ALT 23 01/23/2012 0745   BILITOT 0.4 01/30/2014 0839   BILITOT 0.52 01/26/2013 0805   BILITOT 0.70 01/23/2012 0745       RADIOGRAPHIC STUDIES: Ct Chest W Contrast  01/30/2014   CLINICAL DATA:  Lymphoma.  EXAM: CT CHEST, ABDOMEN, AND PELVIS WITH CONTRAST  TECHNIQUE: Multidetector CT imaging of the chest, abdomen and pelvis was performed following the standard protocol during bolus administration of intravenous contrast.  CONTRAST:  142mL OMNIPAQUE IOHEXOL 300 MG/ML  SOLN  COMPARISON:  CT ABD/PELVIS W CM dated 01/26/2013  FINDINGS: CT CHEST FINDINGS  No pathologically enlarged mediastinal, hilar or axillary lymph nodes. Calcified right hilar lymph nodes. Left circumflex coronary artery calcification. Heart size normal. No pericardial effusion.  Calcified granuloma in the right middle lobe. Smudgy nodules along the left major fissure are unchanged and indicative of subpleural lymph nodes. No pleural fluid. Airway is unremarkable.  CT ABDOMEN AND PELVIS FINDINGS  Liver, gallbladder, adrenal glands, kidneys, spleen, pancreas, stomach and bowel are unremarkable. Appendix is normal. No pathologically enlarged lymph nodes. Retroaortic left renal vein. Minimal scattered atherosclerotic calcification of the arterial vasculature without abdominal aortic aneurysm. No worrisome lytic or sclerotic lesions.  IMPRESSION: No evidence of recurrent lymphoma in the chest, abdomen or pelvis.   Electronically Signed   By: Lorin Picket M.D.   On: 01/30/2014 11:15   Ct Abdomen Pelvis W Contrast  01/30/2014   CLINICAL DATA:  Lymphoma.   EXAM: CT CHEST, ABDOMEN, AND PELVIS WITH CONTRAST  TECHNIQUE: Multidetector CT imaging of the chest, abdomen and pelvis was performed following the standard protocol during bolus administration of intravenous contrast.  CONTRAST:  122mL OMNIPAQUE IOHEXOL 300 MG/ML  SOLN  COMPARISON:  CT ABD/PELVIS W CM dated 01/26/2013  FINDINGS: CT CHEST FINDINGS  No pathologically enlarged mediastinal, hilar or axillary lymph nodes. Calcified right hilar lymph nodes. Left circumflex coronary artery calcification. Heart size normal. No pericardial effusion.  Calcified granuloma in the right middle lobe. Smudgy nodules along the left major fissure are unchanged and indicative of subpleural lymph nodes. No pleural fluid. Airway is unremarkable.  CT ABDOMEN AND PELVIS FINDINGS  Liver, gallbladder, adrenal glands, kidneys, spleen, pancreas, stomach and bowel are unremarkable. Appendix is normal. No pathologically enlarged lymph nodes. Retroaortic left renal vein. Minimal scattered  atherosclerotic calcification of the arterial vasculature without abdominal aortic aneurysm. No worrisome lytic or sclerotic lesions.  IMPRESSION: No evidence of recurrent lymphoma in the chest, abdomen or pelvis.   Electronically Signed   By: Lorin Picket M.D.   On: 01/30/2014 11:15       ASSESSMENT AND PLAN: This is a very pleasant 53 years old Hispanic male with history of follicular lymphoma status post 6 cycles of systemic chemotherapy with CHOP/Rituxan followed by 2 years of maintenance Rituxan. He has been observation with no evidence for disease recurrence.  I discussed the scan results with the patient today. I recommended for him to continue on observation with repeat CT scan of the chest, abdomen and pelvis in one year.  He was advised to call immediately if he has any concerning symptoms in the interval.  All questions were answered. The patient knows to call the clinic with any problems, questions or concerns. We can certainly see the  patient much sooner if necessary.  Disclaimer: This note was dictated with voice recognition software. Similar sounding words can inadvertently be transcribed and may not be corrected upon review.

## 2014-02-01 NOTE — Telephone Encounter (Signed)
gv and printed appt sched and avs forpt for Feb adn March 2016...[facility] did not want barium at this time

## 2014-02-01 NOTE — Patient Instructions (Signed)
Followup visit in one year with repeat CT scan of the chest, abdomen and pelvis.

## 2014-03-11 ENCOUNTER — Other Ambulatory Visit: Payer: Self-pay | Admitting: Family Medicine

## 2014-05-25 ENCOUNTER — Telehealth: Payer: Self-pay | Admitting: Family Medicine

## 2014-05-31 NOTE — Telephone Encounter (Signed)
Called Pt to schedule for LDL and A1C lab work as part of DM care. Please make appointment upon returned call.

## 2014-11-21 ENCOUNTER — Other Ambulatory Visit: Payer: Self-pay | Admitting: Nurse Practitioner

## 2014-12-17 ENCOUNTER — Emergency Department (INDEPENDENT_AMBULATORY_CARE_PROVIDER_SITE_OTHER)
Admission: EM | Admit: 2014-12-17 | Discharge: 2014-12-17 | Disposition: A | Discharge status: Home / Self Care | Payer: Medicaid Other | Source: Home / Self Care | Attending: Family Medicine | Admitting: Family Medicine

## 2014-12-17 ENCOUNTER — Encounter (HOSPITAL_COMMUNITY): Payer: Self-pay | Admitting: *Deleted

## 2014-12-17 DIAGNOSIS — S39012A Strain of muscle, fascia and tendon of lower back, initial encounter: Secondary | ICD-10-CM

## 2014-12-17 MED ORDER — DICLOFENAC POTASSIUM 50 MG PO TABS: 50.0000 mg | ORAL_TABLET | Freq: Three times a day (TID) | ORAL | Status: DC

## 2014-12-17 MED ORDER — CYCLOBENZAPRINE HCL 5 MG PO TABS: 5.0000 mg | ORAL_TABLET | Freq: Three times a day (TID) | ORAL | Status: DC | PRN

## 2014-12-17 NOTE — ED Notes (Signed)
Pt      Reports             Middle        Back  Pain          X   sev  Days         denys     Any  specefic  Injury                Sitting  Upright on  The  Exam table  Speaking in  Complete  sentances

## 2014-12-17 NOTE — ED Provider Notes (Signed)
CSN: [id number]     Arrival date & time 12/17/14  0906 History   First MD Initiated Contact with Patient 12/17/14 [phone]     Chief Complaint  Patient presents with  . Back Pain   (Consider location/radiation/quality/duration/timing/severity/associated sxs/prior Treatment) Patient is a 54 y.o. male presenting with back pain. The history is provided by the patient.  Back Pain Location:  Thoracic spine Quality:  Stiffness Radiates to:  Does not radiate Pain severity:  Mild Onset quality:  Gradual Duration:  2 days Progression:  Unchanged Chronicity:  Recurrent Relieved by:  None tried Worsened by:  Nothing tried Ineffective treatments:  None tried Associated symptoms: no abdominal pain, no chest pain, no fever, no numbness and no paresthesias     Past Medical History  Diagnosis Date  . Hypertension 11/27/2011  . Diabetes mellitus   . Hypercholesterolemia   . Lymphoma     gets annual chemo last tx Feb 2013  . Lymphoma    History reviewed. No pertinent past surgical history. History reviewed. No pertinent family history. History  Substance Use Topics  . Smoking status: Former Smoker    Quit date: 12/08/2006  . Smokeless tobacco: Former Systems developer    Quit date: 11/20/2006  . Alcohol Use: No    Review of Systems  Constitutional: Negative.  Negative for fever.  Cardiovascular: Negative for chest pain.  Gastrointestinal: Negative for abdominal pain.  Genitourinary: Negative.   Musculoskeletal: Positive for back pain. Negative for arthralgias, neck pain and neck stiffness.  Skin: Negative.   Neurological: Negative for numbness and paresthesias.    Allergies  Iohexol  Home Medications   Prior to Admission medications   Medication Sig Start Date End Date Taking? Authorizing Provider  CIALIS 20 MG tablet TAKE ONE TABLET BY MOUTH EVERY DAY AS NEEDED FOR  ERECTILE  DYSFUNCTION    Alveda Reasons, MD  cyclobenzaprine (FLEXERIL) 5 MG tablet Take 1 tablet (5 mg total) by mouth 3  (three) times daily as needed for muscle spasms. 12/17/14   Billy Fischer, MD  diclofenac (CATAFLAM) 50 MG tablet Take 1 tablet (50 mg total) by mouth 3 (three) times daily. For back pain 12/17/14   Billy Fischer, MD  diclofenac (VOLTAREN) 75 MG EC tablet Take 1 tablet (75 mg total) by mouth 2 (two) times daily. 04/09/13   Harden Mo, MD  diclofenac (VOLTAREN) 75 MG EC tablet Take 1 tablet (75 mg total) by mouth 2 (two) times daily. 09/11/13   Harden Mo, MD  glipiZIDE (GLUCOTROL) 10 MG tablet Take 1 tablet (10 mg total) by mouth 2 (two) times daily before a meal. 01/06/14   Dayarmys Piloto de Gwendalyn Ege, MD  HYDROcodone-acetaminophen (NORCO) 5-325 MG per tablet Take 1-2 tablets by mouth every 4 (four) hours as needed for pain. 04/08/13   Vivi Ferns, MD  metFORMIN (GLUCOPHAGE) 1000 MG tablet Take 1 tablet (1,000 mg total) by mouth 2 (two) times daily with a meal. 01/06/14   Dayarmys [place] de Gwendalyn Ege, MD  omeprazole (PRILOSEC) 40 MG capsule Take 1 capsule (40 mg total) by mouth daily. 01/06/14   Dayarmys Pil[place] de Gwendalyn Ege, MD  tadalafil (CIALIS) 20 MG tablet Take 1 tablet (20 mg total) by mouth daily as needed for erectile dysfunction. 11/21/11 12/21/11  Willeen Niece, MD  tiZANidine (ZANAFLEX) 4 MG capsule Take 1 capsule (4 mg total) by mouth 3 (three) times daily. 04/09/13   Harden Mo, MD  tiZANidine (ZANAFLEX) 4 MG  tablet Take 1 tablet (4 mg total) by mouth every 6 (six) hours as needed. 09/11/13   Harden Mo, MD   BP 133/85 mmHg  Pulse 77  Temp(Src) 98.1 F (36.7 C) (Oral)  Resp 16 Physical Exam  Constitutional: He is oriented to person, place, and time. He appears well-developed and well-nourished.  Musculoskeletal: He exhibits tenderness.       Thoracic back: He exhibits decreased range of motion, tenderness, pain and spasm. He exhibits no bony tenderness and normal pulse.  Neurological: He is alert and oriented to person, place, and time.  Skin: Skin is warm and dry.  Nursing note and  vitals reviewed.   ED Course  Procedures (including critical care time) Labs Review Labs Reviewed - No data to display  Imaging Review No results found.   MDM   1. Back strain, initial encounter        Billy Fischer, MD 12/17/14 [phone]

## 2015-01-30 ENCOUNTER — Ambulatory Visit (HOSPITAL_COMMUNITY): Payer: Medicaid Other

## 2015-01-30 ENCOUNTER — Other Ambulatory Visit: Payer: Medicaid Other

## 2015-01-30 ENCOUNTER — Ambulatory Visit: Payer: Medicaid Other

## 2015-01-31 ENCOUNTER — Encounter (HOSPITAL_COMMUNITY): Payer: Self-pay

## 2015-01-31 ENCOUNTER — Other Ambulatory Visit (HOSPITAL_BASED_OUTPATIENT_CLINIC_OR_DEPARTMENT_OTHER): Payer: Medicaid Other

## 2015-01-31 ENCOUNTER — Ambulatory Visit (HOSPITAL_COMMUNITY)
Admission: RE | Admit: 2015-01-31 | Discharge: 2015-01-31 | Disposition: A | Discharge status: Home / Self Care | Payer: Self-pay | Source: Ambulatory Visit | Attending: Internal Medicine | Admitting: Internal Medicine

## 2015-01-31 DIAGNOSIS — C8599 Non-Hodgkin lymphoma, unspecified, extranodal and solid organ sites: Secondary | ICD-10-CM

## 2015-01-31 DIAGNOSIS — Z8572 Personal history of non-Hodgkin lymphomas: Secondary | ICD-10-CM

## 2015-01-31 DIAGNOSIS — Z9221 Personal history of antineoplastic chemotherapy: Secondary | ICD-10-CM | POA: Insufficient documentation

## 2015-01-31 DIAGNOSIS — C8590 Non-Hodgkin lymphoma, unspecified, unspecified site: Secondary | ICD-10-CM | POA: Insufficient documentation

## 2015-01-31 LAB — COMPREHENSIVE METABOLIC PANEL (CC13)
ALT: 18 U/L (ref 0–55)
AST: 13 U/L (ref 5–34)
Albumin: 3.8 g/dL (ref 3.5–5.0)
Alkaline Phosphatase: 96 U/L (ref 40–150)
Anion Gap: 10 mEq/L (ref 3–11)
BILIRUBIN TOTAL: 0.46 mg/dL (ref 0.20–1.20)
BUN: 18.4 mg/dL (ref 7.0–26.0)
CO2: 27 mEq/L (ref 22–29)
CREATININE: 0.9 mg/dL (ref 0.7–1.3)
Calcium: 10.1 mg/dL (ref 8.4–10.4)
Chloride: 102 mEq/L (ref 98–109)
EGFR: >90 mL/min/{1.73_m2} (ref >90)
Glucose: 212 mg/dl — ABNORMAL HIGH (ref 70–140)
Potassium: 4.4 mEq/L (ref 3.5–5.1)
Sodium: 139 mEq/L (ref 136–145)
TOTAL PROTEIN: 6.9 g/dL (ref 6.4–8.3)

## 2015-01-31 LAB — CBC WITH DIFFERENTIAL/PLATELET
BASO%: 0.1 % (ref 0.0–2.0)
Basophils Absolute: 0 10*3/uL (ref 0.0–0.1)
EOS ABS: 0.7 10*3/uL — AB (ref 0.0–0.5)
EOS%: 8.1 % — ABNORMAL HIGH (ref 0.0–7.0)
HEMATOCRIT: 46.1 % (ref 38.4–49.9)
HGB: 16.3 g/dL (ref 13.0–17.1)
LYMPH%: 25.7 % (ref 14.0–49.0)
MCH: 30.3 pg (ref 27.2–33.4)
MCHC: 35.4 g/dL (ref 32.0–36.0)
MCV: 85.7 fL (ref 79.3–98.0)
MONO#: 0.9 10*3/uL (ref 0.1–0.9)
MONO%: 10 % (ref 0.0–14.0)
NEUT%: 56.1 % (ref 39.0–75.0)
NEUTROS ABS: 4.9 10*3/uL (ref 1.5–6.5)
PLATELETS: 229 10*3/uL (ref 140–400)
RBC: 5.38 10*6/uL (ref 4.20–5.82)
RDW: 12.6 % (ref 11.0–14.6)
WBC: 8.8 10*3/uL (ref 4.0–10.3)
lymph#: 2.3 10*3/uL (ref 0.9–3.3)

## 2015-01-31 LAB — LACTATE DEHYDROGENASE: LDH: 180 U/L (ref 94–250)

## 2015-01-31 IMAGING — CT CT CHEST W/ CM
2 of 4 series · 17 of 46 positions shown, 19 images · IV contrast (OMNIPAQUE)
Comparison: [DATE]

CLINICAL DATA: Non-Hodgkin's lymphoma diagnosed in [DATE].
Prior chemotherapy.

EXAM:
CT CHEST, ABDOMEN, AND PELVIS WITH CONTRAST
TECHNIQUE: Multidetector CT imaging of the chest, abdomen and pelvis was
performed following the standard protocol during bolus
administration of intravenous contrast.
CONTRAST:  100mL OMNIPAQUE IOHEXOL 300 MG/ML SOLN pre-medicated with
Benadryl 50 mg due to prior reaction (hives) to contrast medium.

[Series 2: cap with st · axial · 0.73mm/px · z∈[-656,-82]mm · 14 of 127 slices shown, 16 images]
[im 6/127  soft-tissue]
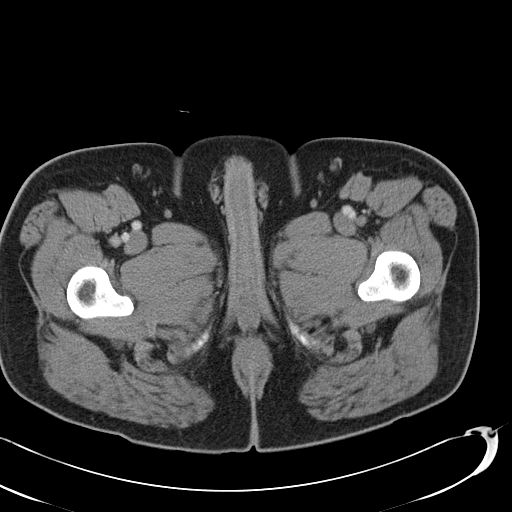
[im 6/127  bone]
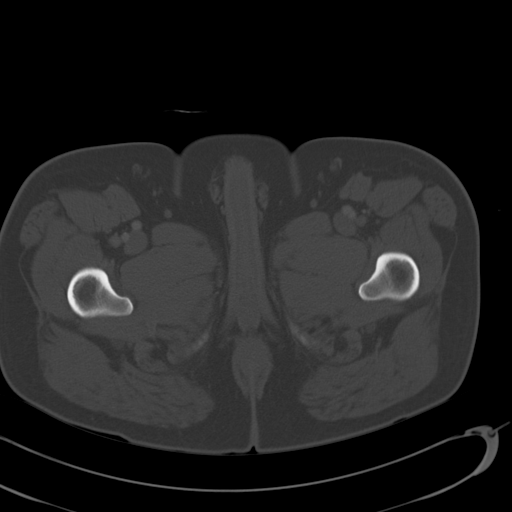
[im 16/127  soft-tissue]
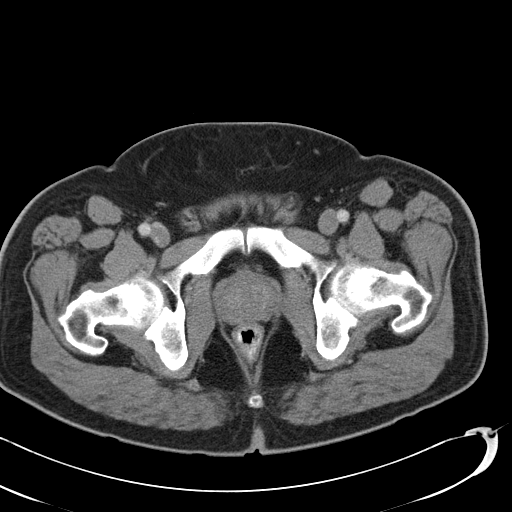
[im 27/127  soft-tissue]
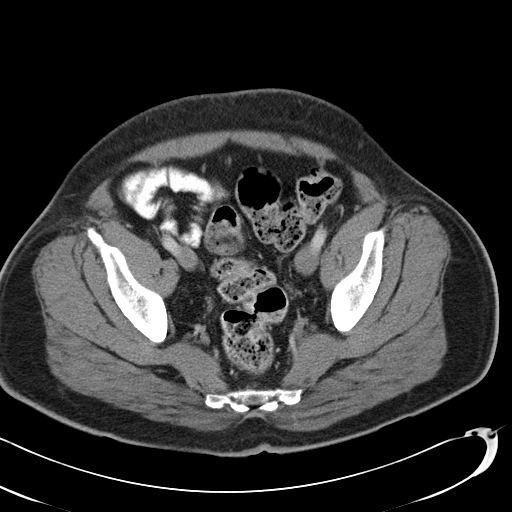
[im 32/127  soft-tissue]
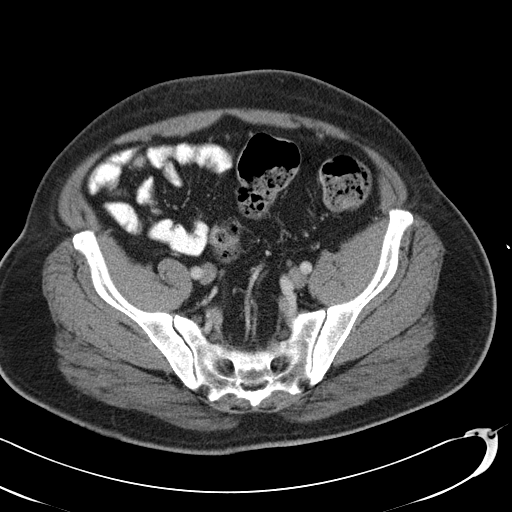
[im 43/127  soft-tissue]
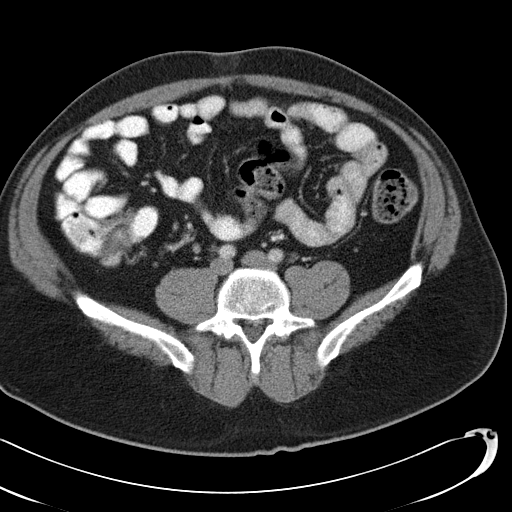
[im 53/127  soft-tissue]
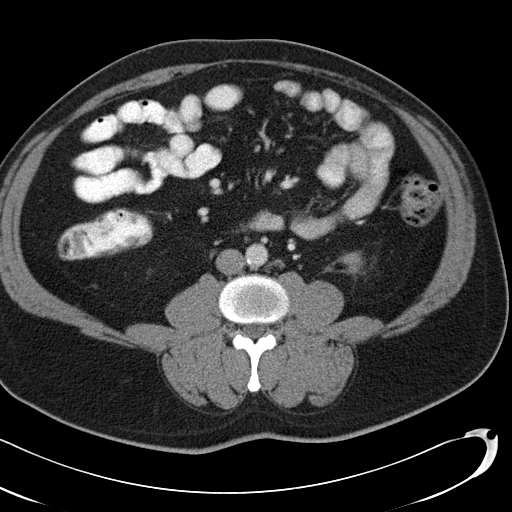
[im 58/127  soft-tissue]
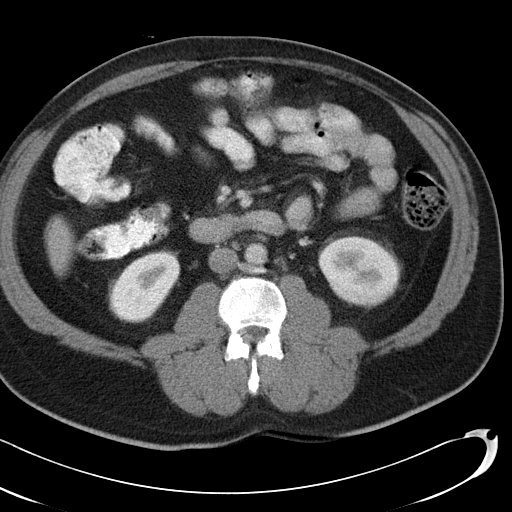
[im 69/127  soft-tissue]
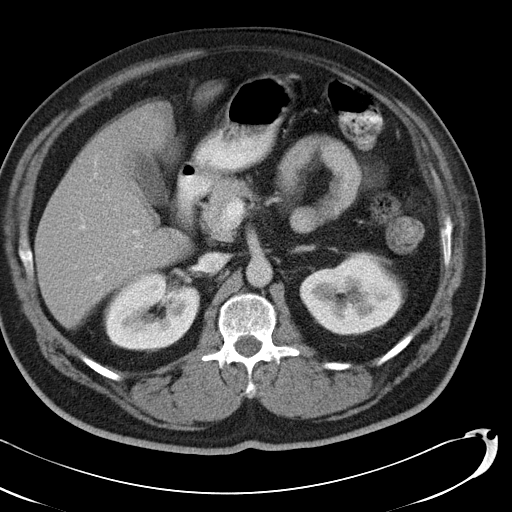
[im 74/127  soft-tissue]
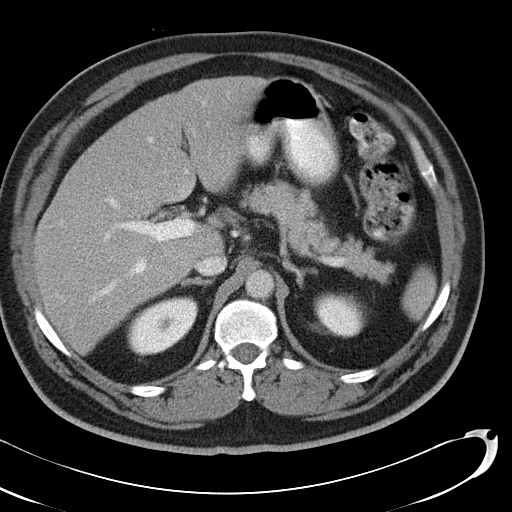
[im 74/127  bone]
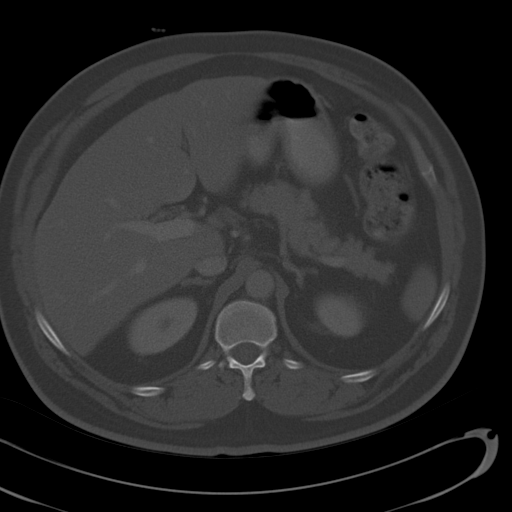
[im 85/127  soft-tissue]
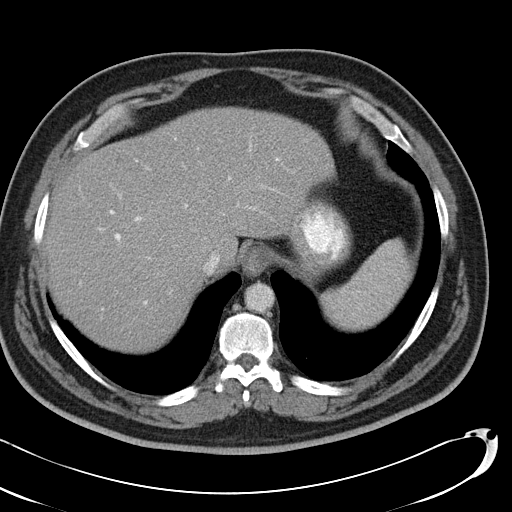
[im 95/127  soft-tissue]
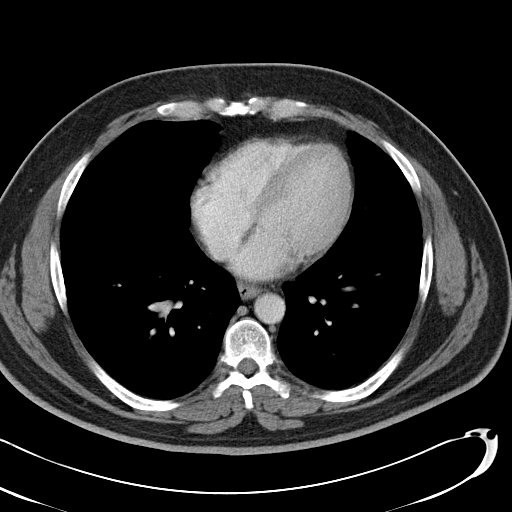
[im 100/127  soft-tissue]
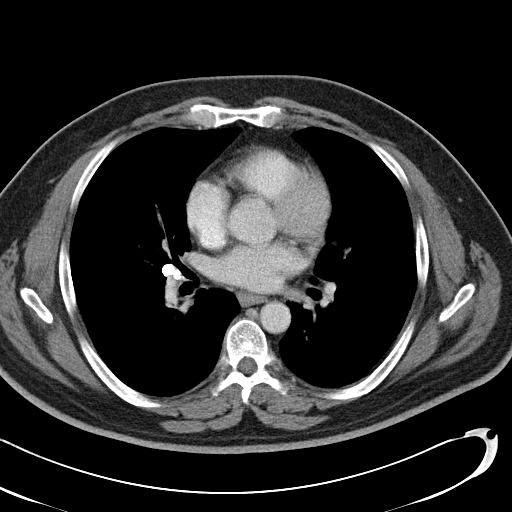
[im 111/127  soft-tissue]
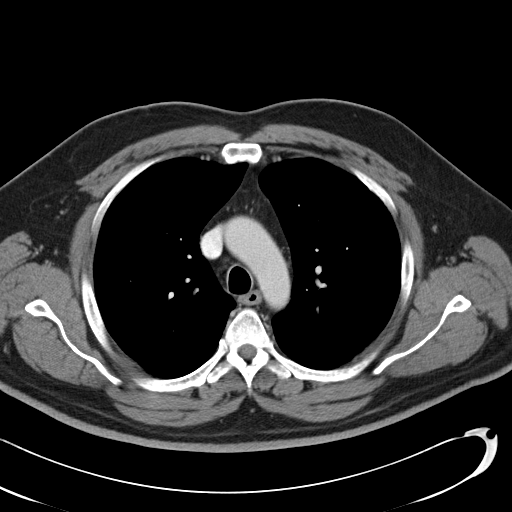
[im 121/127  soft-tissue]
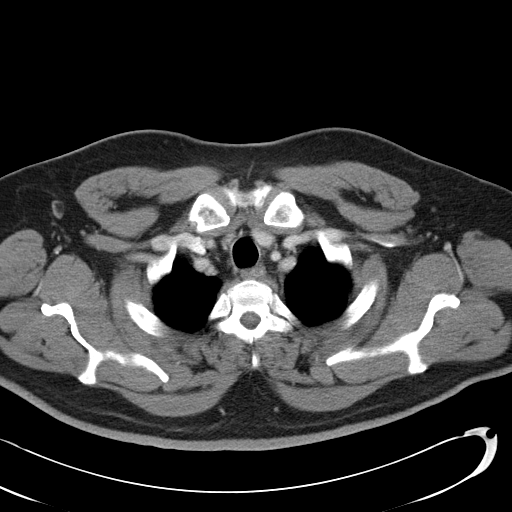

[Series 603: <mpr thick range> · coronal · 1.24mm/px · 3 of 101 slices shown]
[im 34/101  soft-tissue]
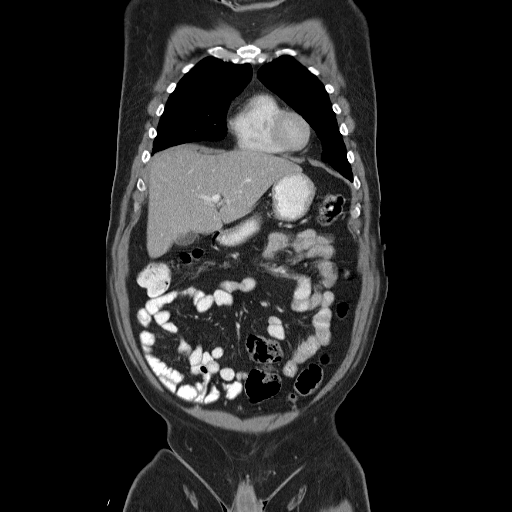
[im 45/101  soft-tissue]
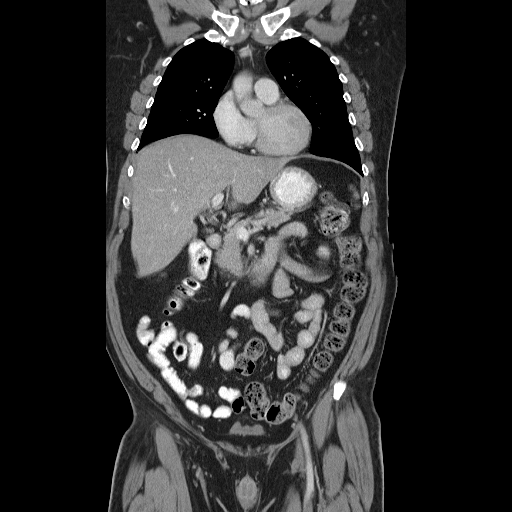
[im 56/101  soft-tissue]
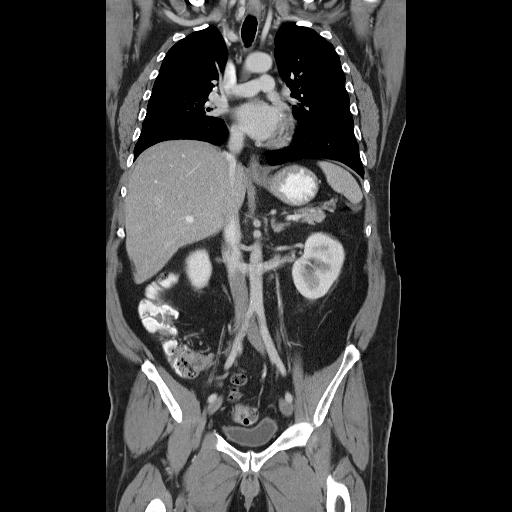

[17 of 46 positions shown; findings below may reference images not displayed]

FINDINGS: CT CHEST FINDINGS

Mediastinum/Nodes: Calcified right hilar and infrahilar lymph nodes
along with calcified granulomas in the right lung, particularly the
right middle lobe.

No pathologic thoracic adenopathy.

Lungs/Pleura: Stable small subpleural lymph nodes along the left
major fissure. Otherwise negative.

Musculoskeletal: Unremarkable

CT ABDOMEN PELVIS FINDINGS

Hepatobiliary: Unremarkable

Pancreas: Unremarkable

Spleen: Unremarkable

Adrenals/Urinary Tract: Unremarkable

Stomach/Bowel: Unremarkable

Vascular/Lymphatic: Unremarkable

Reproductive: Unremarkable

Other: No supplemental non-categorized findings.

Musculoskeletal: Small umbilical hernia contains adipose tissue.
IMPRESSION: 1. No current findings of recurrent lymphoma.
2. Old granulomatous disease in the right chest.
3. Small umbilical hernia contains adipose tissue.

## 2015-01-31 MED ORDER — IOHEXOL 300 MG/ML  SOLN
100.0000 mL | Freq: Once | INTRAMUSCULAR | Status: AC | PRN
  Administered 2015-01-31: 100 mL via INTRAVENOUS

## 2015-01-31 MED ORDER — IOHEXOL 300 MG/ML  SOLN
50.0000 mL | Freq: Once | INTRAMUSCULAR | Status: DC | PRN
Start: 2015-01-31 — End: 2015-01-31

## 2015-02-06 ENCOUNTER — Telehealth: Payer: Self-pay | Admitting: Internal Medicine

## 2015-02-06 ENCOUNTER — Ambulatory Visit (HOSPITAL_BASED_OUTPATIENT_CLINIC_OR_DEPARTMENT_OTHER): Payer: Medicaid Other | Admitting: Internal Medicine

## 2015-02-06 ENCOUNTER — Encounter: Payer: Self-pay | Admitting: Internal Medicine

## 2015-02-06 VITALS — BP 138/67 | HR 85 | Temp 98.4°F | Resp 18 | Ht 65.0 in | Wt 199.9 lb

## 2015-02-06 DIAGNOSIS — C8290 Follicular lymphoma, unspecified, unspecified site: Secondary | ICD-10-CM

## 2015-02-06 DIAGNOSIS — Z8572 Personal history of non-Hodgkin lymphomas: Secondary | ICD-10-CM

## 2015-02-06 NOTE — Progress Notes (Signed)
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  2) Maintenance Rituxan at 375 mg per meter square given every 2 months status post 12 cycles   CURRENT THERAPY: Observation.  INTERVAL HISTORY: Blake Vaughn 54 y.o. male returns to the clinic today for routine annual followup visit accompanied by his Spanish interpreter. He has been on observation for the last 4 years. The patient is feeling fine today with no specific complaints. He denied having any significant chest pain, shortness of breath, cough or hemoptysis. The patient denied having any significant weight loss or night sweats. He has no palpable lymphadenopathy. He had repeat CT scan of the chest, abdomen and pelvis performed recently and he is here today for evaluation and discussion of his scan results.   MEDICAL HISTORY: Past Medical History  Diagnosis Date  . Hypertension 11/27/2011  . Diabetes mellitus   . Hypercholesterolemia   . Lymphoma     gets annual chemo last tx Feb 2013  . Lymphoma     ALLERGIES:  is allergic to iohexol.  MEDICATIONS:  Current Outpatient Prescriptions  Medication Sig Dispense Refill  . CIALIS 20 MG tablet TAKE ONE TABLET BY MOUTH EVERY DAY AS NEEDED FOR  ERECTILE  DYSFUNCTION 10 tablet 0  . cyclobenzaprine (FLEXERIL) 5 MG tablet Take 1 tablet (5 mg total) by mouth 3 (three) times daily as needed for muscle spasms. 30 tablet 0  . diclofenac (CATAFLAM) 50 MG tablet Take 1 tablet (50 mg total) by mouth 3 (three) times daily. For back pain 30 tablet 0  . diclofenac (VOLTAREN) 75 MG EC tablet Take 1 tablet (75 mg total) by mouth 2 (two) times daily. 20 tablet 0  . diclofenac (VOLTAREN) 75 MG EC tablet  Take 1 tablet (75 mg total) by mouth 2 (two) times daily. 20 tablet 0  . glipiZIDE (GLUCOTROL) 10 MG tablet Take 1 tablet (10 mg total) by mouth 2 (two) times daily before a meal. 60 tablet 6  . HYDROcodone-acetaminophen (NORCO) 5-325 MG per tablet Take 1-2 tablets by mouth every 4 (four) hours as needed for pain. 30 tablet 0  . metFORMIN (GLUCOPHAGE) 1000 MG tablet Take 1 tablet (1,000 mg total) by mouth 2 (two) times daily with a meal. 60 tablet 6  . omeprazole (PRILOSEC) 40 MG capsule Take 1 capsule (40 mg total) by mouth daily. 30 capsule 3  . tadalafil (CIALIS) 20 MG tablet Take 1 tablet (20 mg total) by mouth daily as needed for erectile dysfunction. 8 tablet 2  . tiZANidine (ZANAFLEX) 4 MG capsule Take 1 capsule (4 mg total) by mouth 3 (three) times daily. 30 capsule 0  . tiZANidine (ZANAFLEX) 4 MG tablet Take 1 tablet (4 mg total) by mouth every 6 (six) hours as needed. 30 tablet 0   No current facility-administered medications for this visit.    REVIEW OF SYSTEMS:  A comprehensive review of systems was negative.   PHYSICAL EXAMINATION: General appearance: alert, cooperative and no distress Head: Normocephalic, without obvious abnormality, atraumatic Neck: no adenopathy Lymph nodes: Cervical, supraclavicular, and axillary nodes normal. Resp: clear to auscultation bilaterally Cardio: regular rate and rhythm, S1, S2 normal, no murmur, click, rub or gallop GI: soft, non-tender; bowel sounds  normal; no masses,  no organomegaly Extremities: extremities normal, atraumatic, no cyanosis or edema  ECOG PERFORMANCE STATUS: 0 - Asymptomatic  There were no vitals taken for this visit.  LABORATORY DATA: Lab Results  Component Value Date   WBC 8.8 01/31/2015   HGB 16.3 01/31/2015   HCT 46.1 01/31/2015   MCV 85.7 01/31/2015   PLT 229 01/31/2015      Chemistry      Component Value Date/Time   NA 139 01/31/2015 0848   NA 134* 01/30/2014 0839   NA 141 01/23/2012 0745   K 4.4  01/31/2015 0848   K 4.4 01/30/2014 0839   K 4.5 01/23/2012 0745   CL 98 01/30/2014 0839   CL 102 01/26/2013 0805   CL 97* 01/23/2012 0745   CO2 27 01/31/2015 0848   CO2 23 01/30/2014 0839   CO2 29 01/23/2012 0745   BUN 18.4 01/31/2015 0848   BUN 14 01/30/2014 0839   BUN 11 01/23/2012 0745   CREATININE 0.9 01/31/2015 0848   CREATININE 0.70 01/30/2014 0839   CREATININE 0.80 11/11/2012 0939   GLU 206* 02/06/2009 0836      Component Value Date/Time   CALCIUM 10.1 01/31/2015 0848   CALCIUM 9.7 01/30/2014 0839   CALCIUM 9.3 01/23/2012 0745   ALKPHOS 96 01/31/2015 0848   ALKPHOS 95 01/30/2014 0839   ALKPHOS 64 01/23/2012 0745   AST 13 01/31/2015 0848   AST 16 01/30/2014 0839   AST 20 01/23/2012 0745   ALT 18 01/31/2015 0848   ALT 25 01/30/2014 0839   ALT 23 01/23/2012 0745   BILITOT 0.46 01/31/2015 0848   BILITOT 0.4 01/30/2014 0839   BILITOT 0.70 01/23/2012 0745       RADIOGRAPHIC STUDIES: Ct Chest W Contrast  01/31/2015   CLINICAL DATA:  Non-Hodgkin's lymphoma diagnosed in December 2009. Prior chemotherapy.  EXAM: CT CHEST, ABDOMEN, AND PELVIS WITH CONTRAST  TECHNIQUE: Multidetector CT imaging of the chest, abdomen and pelvis was performed following the standard protocol during bolus administration of intravenous contrast.  CONTRAST:  12mL OMNIPAQUE IOHEXOL 300 MG/ML SOLN pre-medicated with Benadryl 50 mg due to prior reaction (hives) to contrast medium.  COMPARISON:  01/30/2014  FINDINGS: CT CHEST FINDINGS  Mediastinum/Nodes: Calcified right hilar and infrahilar lymph nodes along with calcified granulomas in the right lung, particularly the right middle lobe.  No pathologic thoracic adenopathy.  Lungs/Pleura: Stable small subpleural lymph nodes along the left major fissure. Otherwise negative.  Musculoskeletal: Unremarkable  CT ABDOMEN PELVIS FINDINGS  Hepatobiliary: Unremarkable  Pancreas: Unremarkable  Spleen: Unremarkable  Adrenals/Urinary Tract: Unremarkable  Stomach/Bowel:  Unremarkable  Vascular/Lymphatic: Unremarkable  Reproductive: Unremarkable  Other: No supplemental non-categorized findings.  Musculoskeletal: Small umbilical hernia contains adipose tissue.  IMPRESSION: 1. No current findings of recurrent lymphoma. 2. Old granulomatous disease in the right chest. 3. Small umbilical hernia contains adipose tissue.   Electronically Signed   By: Van Clines M.D.   On: 01/31/2015 13:08   Ct Abdomen Pelvis W Contrast  01/31/2015   CLINICAL DATA:  Non-Hodgkin's lymphoma diagnosed in December 2009. Prior chemotherapy.  EXAM: CT CHEST, ABDOMEN, AND PELVIS WITH CONTRAST  TECHNIQUE: Multidetector CT imaging of the chest, abdomen and pelvis was performed following the standard protocol during bolus administration of intravenous contrast.  CONTRAST:  139mL OMNIPAQUE IOHEXOL 300 MG/ML SOLN pre-medicated with Benadryl 50 mg due to prior reaction (hives) to contrast medium.  COMPARISON:  01/30/2014  FINDINGS: CT CHEST FINDINGS  Mediastinum/Nodes: Calcified right hilar and infrahilar lymph  nodes along with calcified granulomas in the right lung, particularly the right middle lobe.  No pathologic thoracic adenopathy.  Lungs/Pleura: Stable small subpleural lymph nodes along the left major fissure. Otherwise negative.  Musculoskeletal: Unremarkable  CT ABDOMEN PELVIS FINDINGS  Hepatobiliary: Unremarkable  Pancreas: Unremarkable  Spleen: Unremarkable  Adrenals/Urinary Tract: Unremarkable  Stomach/Bowel: Unremarkable  Vascular/Lymphatic: Unremarkable  Reproductive: Unremarkable  Other: No supplemental non-categorized findings.  Musculoskeletal: Small umbilical hernia contains adipose tissue.  IMPRESSION: 1. No current findings of recurrent lymphoma. 2. Old granulomatous disease in the right chest. 3. Small umbilical hernia contains adipose tissue.   Electronically Signed   By: Van Clines M.D.   On: 01/31/2015 13:08   ASSESSMENT AND PLAN: This is a very pleasant 54 years old Hispanic  male with history of follicular lymphoma status post 6 cycles of systemic chemotherapy with CHOP/Rituxan followed by 2 years of maintenance Rituxan. He has been observation with no evidence for disease recurrence.  I discussed the scan results with the patient today. I recommended for him to continue on observation with repeat blood work in one year. We will not order any further scans unless the patient is symptomatic as well as any significant abnormalities and his blood work. He is running out of his diabetic medications. I recommended for the patient to contact Dr. Rosario Jacks office to get refill of his medications. He was advised to call immediately if he has any concerning symptoms in the interval.  All questions were answered. The patient knows to call the clinic with any problems, questions or concerns. We can certainly see the patient much sooner if necessary.  Disclaimer: This note was dictated with voice recognition software. Similar sounding words can inadvertently be transcribed and may not be corrected upon review.

## 2015-02-06 NOTE — Telephone Encounter (Signed)
Pt confirmed labs/ov per 03/01 POF, gave pt AVS... KJ °

## 2015-02-12 ENCOUNTER — Encounter: Payer: Self-pay | Admitting: Family Medicine

## 2015-02-12 NOTE — Progress Notes (Signed)
Pt came in and need med refill on Metformin and Glipizide please call pt when Rx is sent to [facility] [place] @ [phone]

## 2015-02-14 ENCOUNTER — Telehealth: Payer: Self-pay | Admitting: Family Medicine

## 2015-02-14 MED ORDER — GLIPIZIDE 10 MG PO TABS: 10.0000 mg | ORAL_TABLET | Freq: Two times a day (BID) | ORAL | Status: DC

## 2015-02-14 MED ORDER — METFORMIN HCL 1000 MG PO TABS: 1000.0000 mg | ORAL_TABLET | Freq: Two times a day (BID) | ORAL | Status: DC

## 2015-02-14 NOTE — Telephone Encounter (Signed)
Patient has appointment scheduled for 3/11.

## 2015-02-14 NOTE — Telephone Encounter (Signed)
Refills for 2 months. Patient last seen in our office over 1 year ago. For follow up visit before further refills. JB

## 2015-02-16 ENCOUNTER — Encounter: Payer: Self-pay | Admitting: Family Medicine

## 2015-04-27 ENCOUNTER — Encounter: Payer: Self-pay | Admitting: Family Medicine

## 2015-04-27 ENCOUNTER — Ambulatory Visit (INDEPENDENT_AMBULATORY_CARE_PROVIDER_SITE_OTHER): Payer: Self-pay | Admitting: Family Medicine

## 2015-04-27 VITALS — BP 110/68 | HR 72 | Temp 97.8°F | Ht 65.0 in | Wt 188.0 lb

## 2015-04-27 DIAGNOSIS — IMO0002 Reserved for concepts with insufficient information to code with codable children: Secondary | ICD-10-CM

## 2015-04-27 DIAGNOSIS — E1165 Type 2 diabetes mellitus with hyperglycemia: Secondary | ICD-10-CM

## 2015-04-27 DIAGNOSIS — G4762 Sleep related leg cramps: Secondary | ICD-10-CM

## 2015-04-27 LAB — BASIC METABOLIC PANEL
BUN: 14 mg/dL (ref 6–23)
CO2: 24 mEq/L (ref 19–32)
Calcium: 9 mg/dL (ref 8.4–10.5)
Chloride: 99 mEq/L (ref 96–112)
Creat: 0.78 mg/dL (ref 0.50–1.35)
Glucose, Bld: 395 mg/dL — ABNORMAL HIGH (ref 70–99)
Potassium: 4.6 mEq/L (ref 3.5–5.3)
Sodium: 131 mEq/L — ABNORMAL LOW (ref 135–145)

## 2015-04-27 LAB — POCT GLYCOSYLATED HEMOGLOBIN (HGB A1C): Hemoglobin A1C: 12.3

## 2015-04-27 MED ORDER — METFORMIN HCL 1000 MG PO TABS: 1000.0000 mg | ORAL_TABLET | Freq: Two times a day (BID) | ORAL | Status: DC

## 2015-04-27 MED ORDER — GLIPIZIDE 10 MG PO TABS: 10.0000 mg | ORAL_TABLET | Freq: Two times a day (BID) | ORAL | Status: DC

## 2015-04-27 NOTE — Assessment & Plan Note (Signed)
   Nocturnal leg cramps: Patient's leg cramps could be due to his uncontrolled diabetes causing electrolyte imbalance. Does not seem to be any other possible reasons by history. A1c collected today was 12.3. Diabetes medications were restarted. Patient was advised to follow-up with his PCP in 1-2 weeks for diabetes management. B MP was also collected today, patient will be called with results of her abnormal, otherwise they will be sent to his home. Encouraged him to drink more water, staying well hydrated. Perform leg stretches daily. AVS on leg cramps were given to him in [place]. This can be discussed at his next diabetes appointment, or sooner if leg cramps are worsening.

## 2015-04-27 NOTE — Progress Notes (Signed)
   Subjective:    Patient ID: Blake Vaughn, male    DOB: 12/23/1960, 54 y.o.   MRN: [id number]  HPI Spanish speaking phone interpreter  Name: Blake Vaughn #[id number]  Leg cramps: Patient presents with a 4 week history of bilateral nocturnal leg cramps. Patient denies any increase or change to activity, he is on his feet the majority of the day walking. He denies any changes to his diet, and does not eat a high salt diet. His diet consists mostly fresh fruit and no added salt. Patient is a diabetic type II, uncontrolled, that has been without medications for over 2 months. He is on glipizide and metformin for his diabetes, but was lost to follow-up and needed to return to clinic in order to have future refills. Patient denies any hyper or hypoglycemic events. He denies any neuropathy or nonhealing wounds. He does not check his blood sugars.  Past Medical History  Diagnosis Date  . Hypertension 11/27/2011  . Diabetes mellitus   . Hypercholesterolemia   . Lymphoma     gets annual chemo last tx Feb 2013  . Lymphoma     No past surgical history on file.  Review of Systems Per hPI    Objective:   Physical Exam BP 110/68 mmHg  Pulse 72  Temp(Src) 97.8 F (36.6 C) (Oral)  Ht 5\' 5"  (1.651 m)  Wt 188 lb (85.276 kg)  BMI 31.28 kg/m2 Gen: NAD. Nontoxic in appearance, well-developed, well-nourished Spanish-speaking male. HEENT: AT. [place]. bilateral eyes without injections or icterus. MMM.  Ext: No erythema. No edema. + 2/4 PT bilaterally. Multiple hyperpigmented scars bilateral lower extremities. No swelling of lower extremity. Nontender. Negative Homans.     Assessment & Plan:  DA AUTHEMENT is a 54 y.o. -year-old Spanish-speaking male, presented to same-day clinic with complaints of nocturnal leg cramps. Diabetes: Patient has been out of his medications for at least 2 months, leg cramps have been occurring for at least 4 weeks. Possibly could be due to to elevated glucose/electrolyte  imbalance. Patient was lost to follow-up. Refilled metformin and glipizide today. A1c collected today 12.3. Patient will be advised to follow-up with his PCP within 1-2 weeks to discuss diabetes management, as he may be an insulin candidate. Encouraged patient that he will need to follow-up every 3 months for his diabetes.  Nocturnal leg cramps: Patient's leg cramps could be due to his uncontrolled diabetes causing electrolyte imbalance. Does not seem to be any other possible reasons by history. A1c collected today was 12.3. Diabetes medications were restarted. Patient was advised to follow-up with his PCP in 1-2 weeks for diabetes management. B MP was also collected today, patient will be called with results of her abnormal, otherwise they will be sent to his home. Encouraged him to drink more water, staying well hydrated. Perform leg stretches daily. AVS on leg cramps were given to him in [place]. This can be discussed at his next diabetes appointment, or sooner if leg cramps are worsening.

## 2015-04-27 NOTE — Patient Instructions (Signed)
Calambres en las piernas (Leg Cramps) Los calambres en las piernas que ocurren durante la actividad fsica pueden deberse a la mala circulacin o a deshidratacin. Sin embargo, los que ocurren durante el reposo o la noche generalmente no se deben a ningn problema mdico grave. El calor puede causar espasmos musculares durante la poca clida.  CAUSAS No se conoce con certeza la causa de los calambres. Sin embargo, la deshidratacin puede ser uno de los factores en aquellas personas que no beben la cantidad de lquidos suficientes y en los que realizan actividad fsica en Nurse, children's. El desequilibrio en el nivel de [place], potasio, calcio o magnesio en el tejido muscular tambin puede ser un factor. Algunos medicamentos que favorecen la prdida de agua (diurticos), pueden hacer que se pierdan sustancias que el organismo necesita (como sodio y Field seismologist) y .causar calambres musculares. TRATAMIENTO  Asegrese que ingiere la cantidad de lquidos suficiente y los minerales esenciales en su dieta, para que el msculo funcione normalmente.  Evite la actividad fsica extenuante durante [employer] y tiene calambres con frecuencia.  Elongue y haga masajes en el msculo acalambrado durante algunos minutos.  Algunos medicamentos pueden ser de utilidad para algunos pacientes que sufren calambres nocturnos. Tome slo medicamentos de venta libre o prescriptos, segn las indicaciones del mdico. SOLICITE ATENCIN MDICA DE INMEDIATO SI:  Los calambres empeoran.  Los dedos estn fros, adormecidos o de Optician, dispensing. Document Released: 11/24/2005 Document Revised: 02/16/2012 [facility] Patient Information 2015 [place]. This information is not intended to replace advice given to you by your health care provider. Make sure you discuss any questions you have with your health care provider.  I have called in your diabetes medications, make certain to take these every day. You should follow-up every 3 months for  your diabetes. You do have a new primary care provider assigned to you now. We will call you with the results to your lab work.

## 2015-04-27 NOTE — Assessment & Plan Note (Signed)
Diabetes: Patient has been out of his medications for at least 2 months, leg cramps have been occurring for at least 4 weeks. Possibly could be due to to elevated glucose/electrolyte imbalance. Patient was lost to follow-up. Refilled metformin and glipizide today. A1c collected today 12.3. Patient will be advised to follow-up with his PCP within 1-2 weeks to discuss diabetes management, as he may be an insulin candidate. Encouraged patient that he will need to follow-up every 3 months for his diabetes.

## 2015-04-30 ENCOUNTER — Encounter: Payer: Self-pay | Admitting: Family Medicine

## 2015-04-30 ENCOUNTER — Telehealth: Payer: Self-pay | Admitting: Family Medicine

## 2015-04-30 NOTE — Telephone Encounter (Signed)
Please call pt, with spanish speaking interpreter. He is expecting these levels as he was out of medication. I have also encouraged him to follow up with his PCP in 1-2 weeks (was [facility] now [place]) for diabetes management and follow up on leg cramps. I have also sent a letter to his home with these results.  " Your sugar was high (395), as we suspected given you were out of medicine. Please return to clinic for diabetes management with your PCP in 1-2 weeks, as we dicussed. Your A1c was extremely high as well at 12.3%, again because you were out of medications.  Your leg cramps are likely from your uncontrolled diabetes causing electrolyte imbalance, which can cause leg cramps. Please take your medications daily." Thanks.

## 2015-05-02 NOTE — Telephone Encounter (Signed)
Blake Vaughn was in clinic.  She called pt and read below message.  Pt states that the cramping is not as bad and he is taking the meds AM and PM.  Appt made with Dr. Ardelia Mems for 05/24/15 (1st avaliable with PCP). Blake Vaughn, Blake Vaughn, CMA

## 2015-05-24 ENCOUNTER — Ambulatory Visit: Payer: Self-pay | Admitting: Family Medicine

## 2015-07-09 ENCOUNTER — Telehealth: Payer: Self-pay | Admitting: Student

## 2015-07-09 NOTE — Telephone Encounter (Signed)
Will forward to PCP for review of refill. Zorah Backes, CMA.

## 2015-07-09 NOTE — Telephone Encounter (Signed)
Patient requests refill for Cialis. Patient refused to make an appointment because "he works". Please, follow up with Patient (Spanish).

## 2015-07-12 MED ORDER — TADALAFIL 20 MG PO TABS: ORAL_TABLET | ORAL | Status: DC

## 2015-07-12 NOTE — Telephone Encounter (Signed)
Cialis refilled. Please inform the patient  Sheilah Rayos A. Lincoln Brigham MD, [facility] Resident PGY-2 Pager [phone]

## 2015-08-14 ENCOUNTER — Encounter (HOSPITAL_COMMUNITY): Payer: Self-pay | Admitting: Emergency Medicine

## 2015-08-14 ENCOUNTER — Emergency Department (INDEPENDENT_AMBULATORY_CARE_PROVIDER_SITE_OTHER)
Admission: EM | Admit: 2015-08-14 | Discharge: 2015-08-14 | Disposition: A | Discharge status: Home / Self Care | Payer: Self-pay | Source: Home / Self Care | Attending: Emergency Medicine | Admitting: Emergency Medicine

## 2015-08-14 DIAGNOSIS — G44209 Tension-type headache, unspecified, not intractable: Secondary | ICD-10-CM

## 2015-08-14 LAB — GLUCOSE, CAPILLARY: Glucose-Capillary: 248 mg/dL — ABNORMAL HIGH (ref 65–99)

## 2015-08-14 MED ORDER — KETOROLAC TROMETHAMINE 60 MG/2ML IM SOLN
INTRAMUSCULAR | Status: AC
  Filled 2015-08-14: qty 2

## 2015-08-14 MED ORDER — KETOROLAC TROMETHAMINE 60 MG/2ML IM SOLN
60.0000 mg | Freq: Once | INTRAMUSCULAR | Status: AC
  Administered 2015-08-14: 60 mg via INTRAMUSCULAR

## 2015-08-14 MED ORDER — IBUPROFEN 800 MG PO TABS: 800.0000 mg | ORAL_TABLET | Freq: Three times a day (TID) | ORAL | Status: DC | PRN

## 2015-08-14 NOTE — ED Provider Notes (Signed)
CSN: [id number]     Arrival date & time 08/14/15  1431 History   First MD Initiated Contact with Patient 08/14/15 1453     Chief Complaint  Patient presents with  . Headache   (Consider location/radiation/quality/duration/timing/severity/associated sxs/prior Treatment) HPI  He is a 54 year old man here for evaluation of headache. He states for the last 3 days he has had an occipital headache. He states it will come and go, but never completely resolved. He has tried ibuprofen without much improvement.  The pain does go into his shoulders a little bit. He denies any change in vision, nausea, vomiting. No focal numbness, tingling, or weakness. He denies any injury or trauma. He is a diabetic. He is taking his medications as prescribed, but does not check his sugar at home. He does state that 2 days ago his heart felt funny for about 5 minutes. He is unable to further characterize this. He denies any dizziness or diaphoresis.  Past Medical History  Diagnosis Date  . Hypertension 11/27/2011  . Diabetes mellitus   . Hypercholesterolemia   . Lymphoma     gets annual chemo last tx Feb 2013  . Lymphoma    History reviewed. No pertinent past surgical history. History reviewed. No pertinent family history. Social History  Substance Use Topics  . Smoking status: Former Smoker    Quit date: 12/08/2006  . Smokeless tobacco: Former Systems developer    Quit date: 11/20/2006  . Alcohol Use: No    Review of Systems As in history of present illness Allergies  Iohexol  Home Medications   Prior to Admission medications   Medication Sig Start Date End Date Taking? Authorizing Provider  glipiZIDE (GLUCOTROL) 10 MG tablet Take 1 tablet (10 mg total) by mouth 2 (two) times daily before a meal. 04/27/15  Yes Renee A Kuneff, DO  metFORMIN (GLUCOPHAGE) 1000 MG tablet Take 1 tablet (1,000 mg total) by mouth 2 (two) times daily with a meal. 04/27/15  Yes Renee A Kuneff, DO  ibuprofen (ADVIL,MOTRIN) 800 MG tablet Take  1 tablet (800 mg total) by mouth every 8 (eight) hours as needed for headache. 08/14/15   Melony Overly, MD  tadalafil (CIALIS) 20 MG tablet TAKE ONE TABLET BY MOUTH EVERY DAY AS NEEDED FOR  ERECTILE  DYSFUNCTION 07/12/15   Veatrice Bourbon, MD   Meds Ordered and Administered this Visit   Medications  ketorolac (TORADOL) injection 60 mg (not administered)    BP 124/81 mmHg  Pulse 86  Temp(Src) 98.2 F (36.8 C) (Oral)  Resp 16  SpO2 97% No data found.   Physical Exam  Constitutional: He is oriented to person, place, and time. He appears well-developed and well-nourished. No distress.  HENT:  No scalp tenderness.  Eyes: Conjunctivae and EOM are normal. Pupils are equal, round, and reactive to light.  Neck: Neck supple.  Cardiovascular: Normal rate, regular rhythm and normal heart sounds.   No murmur heard. Pulmonary/Chest: Effort normal and breath sounds normal. No respiratory distress. He has no wheezes. He has no rales.  Neurological: He is alert and oriented to person, place, and time. No cranial nerve deficit. He exhibits normal muscle tone. Coordination normal.    ED Course  Procedures (including critical care time) ED ECG REPORT   Date: 08/14/2015  Rate: 83  Rhythm: normal sinus rhythm  QRS Axis: normal  Intervals: normal  ST/T Wave abnormalities: normal  Conduction Disutrbances:none  Narrative Interpretation: normal ekg  Old EKG Reviewed: unchanged  I have  personally reviewed the EKG tracing and agree with the computerized printout as noted.   Labs Review Labs Reviewed  GLUCOSE, CAPILLARY - Abnormal; Notable for the following:    Glucose-Capillary 248 (*)    All other components within normal limits    Imaging Review No results found.    MDM   1. Tension headache    Toradol 60 mg IM given. Prescribed for ibuprofen 800 mg tablets to go home with. Follow-up as needed.    Melony Overly, MD 08/14/15 [phone]

## 2015-08-14 NOTE — ED Notes (Signed)
Pt states he has been suffering from pain in the back of his head for about three days.  He reports some blurred vision and some pain in both shoulders as well.  He denies any injury and denies any N&V.

## 2015-08-14 NOTE — Discharge Instructions (Signed)
Dolor de cabeza por tensin  (Tension Headache)  Una cefalea tensional es un dolor o presin que se siente en la frente y los lados de la cabeza. Las cefaleas tensionales suelen aparecer despus de una situacin de estrs, preocupacin (ansiedad) o por sentirse triste o cado por Ambulance person (deprimido). CUIDADOS EN EL HOGAR   Slo tome los medicamentos segn le indique el mdico.  Cuando sienta dolor de cabeza acustese en un cuarto oscuro y tranquilo.  Lleve un registro diario para averiguar si ciertas cosas provocan dolores de cabeza. Por ejemplo, escriba:  Lo que usted come y bebe.  Cunto tiempo duerme.  Algn cambio en su dieta o medicamentos.  Reljese recibiendo masajes o haga otras actividades relajantes.  Coloque hielo o calor en la cabeza y el cuello como lo indique su mdico.  DISMINUYA EL NIVEL DE ESTRS  Sintase con la espalda recta. No apriete los msculos (tensione).  Si fuma, deje de hacerlo.  Beba menos alcohol.  Consuma menos o deje de tomar cafena.  Coma y haga ejercicios regularmente.  Duerma lo suficiente.  Evite usar mucha medicacin para Conservation officer, historic buildings. SOLICITE AYUDA DE INMEDIATO SI:   El dolor de [place].  Tiene fiebre.  Presenta rigidez en el cuello.  Tiene dificultad para ver.  Sus msculos estn dbiles, o pierde el control muscular.  Pierde el equilibrio o tiene problemas para Writer.  Siente que se desvanece (dbil) o se desmaya.  Tiene malos sntomas que son diferentes a los primeros sntomas.  Tiene problemas con los medicamentos que le haya dado su mdico.  El medicamento no le hace [place].  Siente que Conservation officer, historic buildings de cabeza es diferente a los otros dolores de [place].  Tiene malestar estomacal (nuseas) o(vmitos). ASEGRESE DE QUE:   Comprende estas instrucciones.  Controlar su enfermedad.  Solicitar ayuda de inmediato si no mejora o si empeora. Document Released: 02/16/2012 [facility] Patient Information 2015  [place]. This information is not intended to replace advice given to you by your health care provider. Make sure you discuss any questions you have with your health care provider.

## 2015-09-06 ENCOUNTER — Other Ambulatory Visit: Payer: Self-pay | Admitting: Student

## 2015-09-06 NOTE — Telephone Encounter (Signed)
Needs refills on glucotrol and glipzeride Has been out 2 months. No appts with haney until oct 2o Please advise

## 2015-09-07 MED ORDER — GLIPIZIDE 10 MG PO TABS: 10.0000 mg | ORAL_TABLET | Freq: Two times a day (BID) | ORAL | Status: DC

## 2015-09-07 NOTE — Telephone Encounter (Signed)
Glipizide refilled  Blake Vaughn Brigham MD, [facility] Resident PGY-2 Pager [phone]

## 2015-09-27 ENCOUNTER — Ambulatory Visit: Payer: Self-pay | Admitting: Student

## 2015-10-17 ENCOUNTER — Encounter: Payer: Self-pay | Admitting: Gastroenterology

## 2015-10-17 ENCOUNTER — Ambulatory Visit: Payer: Self-pay | Admitting: Student

## 2015-11-04 ENCOUNTER — Emergency Department (HOSPITAL_COMMUNITY): Payer: Self-pay

## 2015-11-04 ENCOUNTER — Emergency Department (INDEPENDENT_AMBULATORY_CARE_PROVIDER_SITE_OTHER)
Admission: EM | Admit: 2015-11-04 | Discharge: 2015-11-04 | Disposition: A | Discharge status: Nursing Home (Includes ICF) | Payer: Self-pay | Source: Home / Self Care | Attending: Emergency Medicine | Admitting: Emergency Medicine

## 2015-11-04 ENCOUNTER — Encounter (HOSPITAL_COMMUNITY): Payer: Self-pay | Admitting: Emergency Medicine

## 2015-11-04 ENCOUNTER — Encounter (HOSPITAL_COMMUNITY): Payer: Self-pay | Admitting: *Deleted

## 2015-11-04 ENCOUNTER — Emergency Department (HOSPITAL_COMMUNITY)
Admission: EM | Admit: 2015-11-04 | Discharge: 2015-11-04 | Disposition: A | Discharge status: Home / Self Care | Payer: Self-pay | Attending: Emergency Medicine | Admitting: Emergency Medicine

## 2015-11-04 DIAGNOSIS — Z8572 Personal history of non-Hodgkin lymphomas: Secondary | ICD-10-CM | POA: Insufficient documentation

## 2015-11-04 DIAGNOSIS — R002 Palpitations: Secondary | ICD-10-CM

## 2015-11-04 DIAGNOSIS — I159 Secondary hypertension, unspecified: Secondary | ICD-10-CM

## 2015-11-04 DIAGNOSIS — Z87891 Personal history of nicotine dependence: Secondary | ICD-10-CM | POA: Insufficient documentation

## 2015-11-04 DIAGNOSIS — R51 Headache: Secondary | ICD-10-CM | POA: Insufficient documentation

## 2015-11-04 DIAGNOSIS — R519 Headache, unspecified: Secondary | ICD-10-CM

## 2015-11-04 DIAGNOSIS — Z79899 Other long term (current) drug therapy: Secondary | ICD-10-CM | POA: Insufficient documentation

## 2015-11-04 DIAGNOSIS — E119 Type 2 diabetes mellitus without complications: Secondary | ICD-10-CM | POA: Insufficient documentation

## 2015-11-04 DIAGNOSIS — H538 Other visual disturbances: Secondary | ICD-10-CM

## 2015-11-04 DIAGNOSIS — I10 Essential (primary) hypertension: Secondary | ICD-10-CM | POA: Insufficient documentation

## 2015-11-04 DIAGNOSIS — R030 Elevated blood-pressure reading, without diagnosis of hypertension: Secondary | ICD-10-CM

## 2015-11-04 DIAGNOSIS — IMO0001 Reserved for inherently not codable concepts without codable children: Secondary | ICD-10-CM

## 2015-11-04 LAB — CBC
HEMATOCRIT: 42.7 % (ref 39.0–52.0)
Hemoglobin: 15 g/dL (ref 13.0–17.0)
MCH: 30.5 pg (ref 26.0–34.0)
MCHC: 35.1 g/dL (ref 30.0–36.0)
MCV: 86.8 fL (ref 78.0–100.0)
PLATELETS: 219 10*3/uL (ref 150–400)
RBC: 4.92 MIL/uL (ref 4.22–5.81)
RDW: 12.5 % (ref 11.5–15.5)
WBC: 9.5 10*3/uL (ref 4.0–10.5)

## 2015-11-04 LAB — BASIC METABOLIC PANEL
Anion gap: 8 (ref 5–15)
BUN: 22 mg/dL — AB (ref 6–20)
CO2: 22 mmol/L (ref 22–32)
Calcium: 9.1 mg/dL (ref 8.9–10.3)
Chloride: 103 mmol/L (ref 101–111)
Creatinine, Ser: 0.98 mg/dL (ref 0.61–1.24)
Glucose, Bld: 126 mg/dL — ABNORMAL HIGH (ref 65–99)
POTASSIUM: 4 mmol/L (ref 3.5–5.1)
SODIUM: 133 mmol/L — AB (ref 135–145)

## 2015-11-04 LAB — I-STAT TROPONIN, ED: TROPONIN I, POC: 0 ng/mL (ref 0.00–0.08)

## 2015-11-04 LAB — TROPONIN I

## 2015-11-04 IMAGING — CT CT HEAD W/O CM
2 series · 15 of 30 positions shown, 19 images · non-contrast
Comparison: Sinus radiographs performed [DATE]

CLINICAL DATA: Acute onset of headache and blurred vision. Initial
encounter.

EXAM:
CT HEAD WITHOUT CONTRAST
TECHNIQUE: Contiguous axial images were obtained from the base of the skull
through the vertex without intravenous contrast.

[Series 201: head w/o, idose (1) · axial · non-contrast · 0.44mm/px · z∈[+55,+185]mm · 13 of 32 slices shown, 17 images]
[im 3/32  brain]
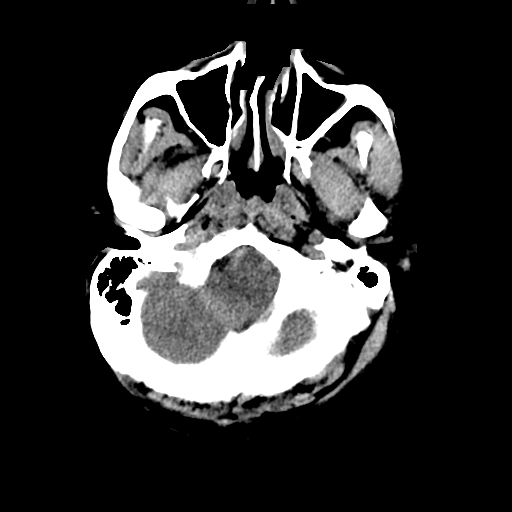
[im 3/32  bone]
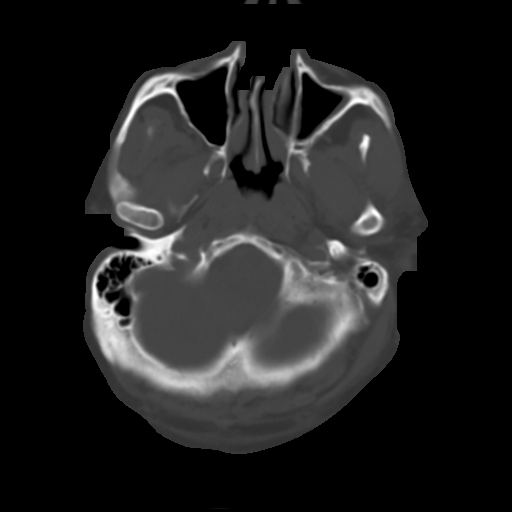
[im 5/32  brain]
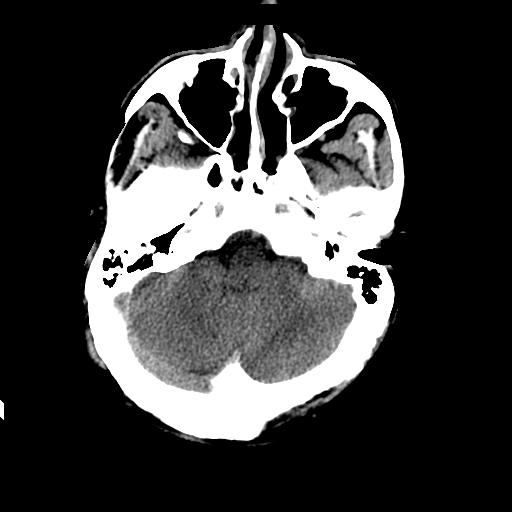
[im 7/32  brain]
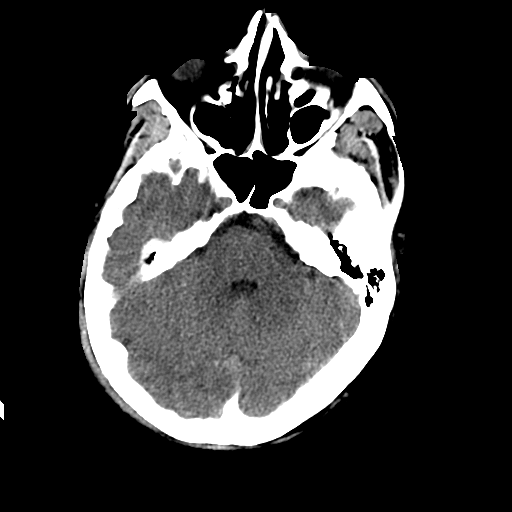
[im 9/32  brain]
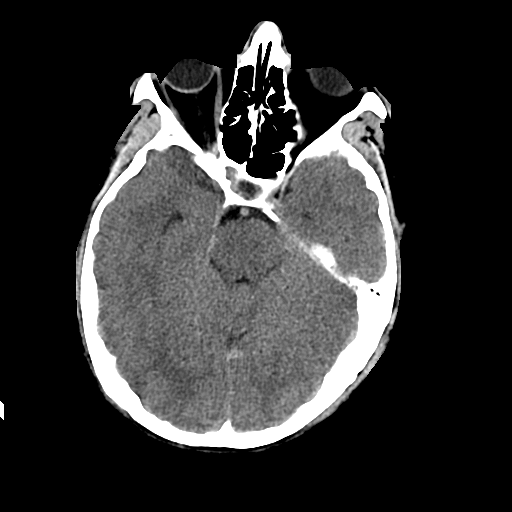
[im 12/32  brain]
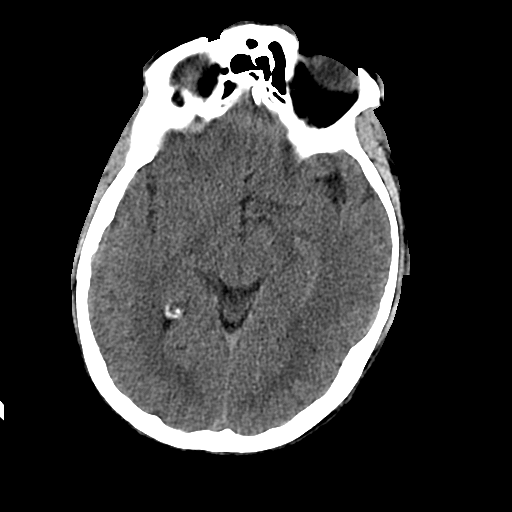
[im 12/32  bone]
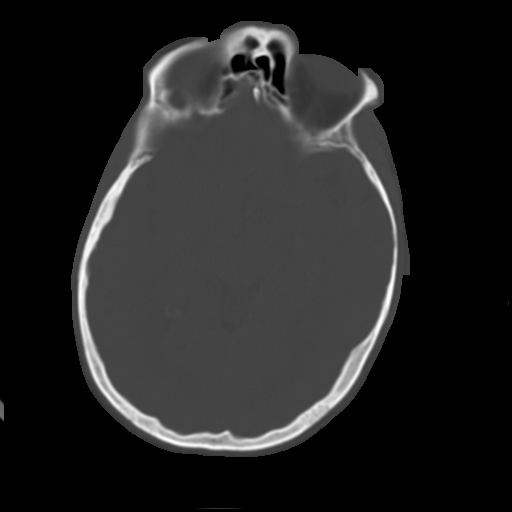
[im 14/32  brain]
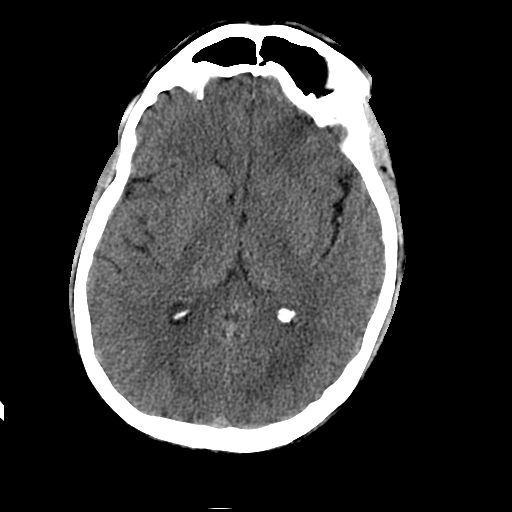
[im 16/32  brain]
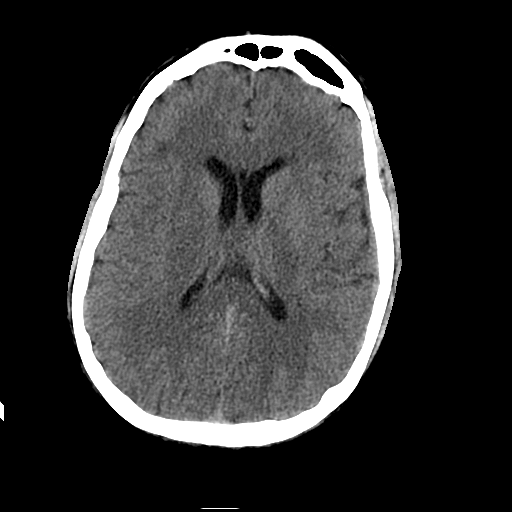
[im 18/32  brain]
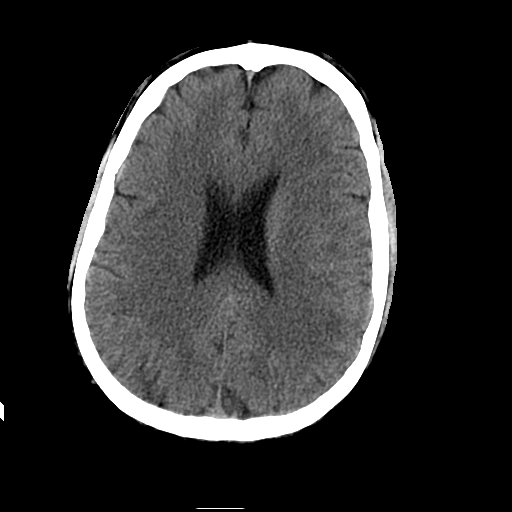
[im 20/32  brain]
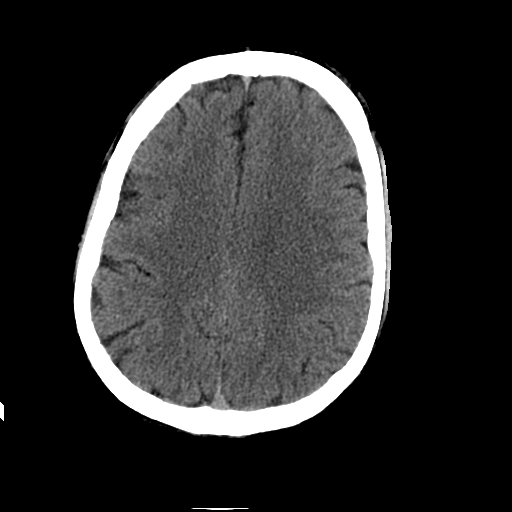
[im 20/32  bone]
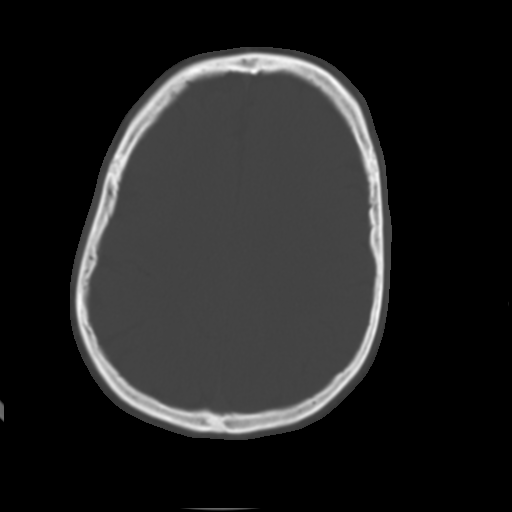
[im 23/32  brain]
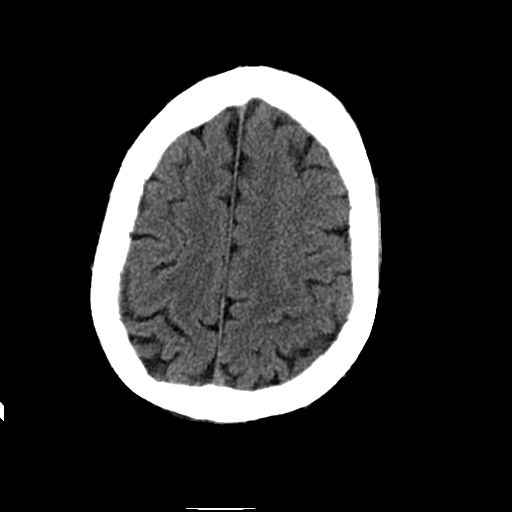
[im 25/32  brain]
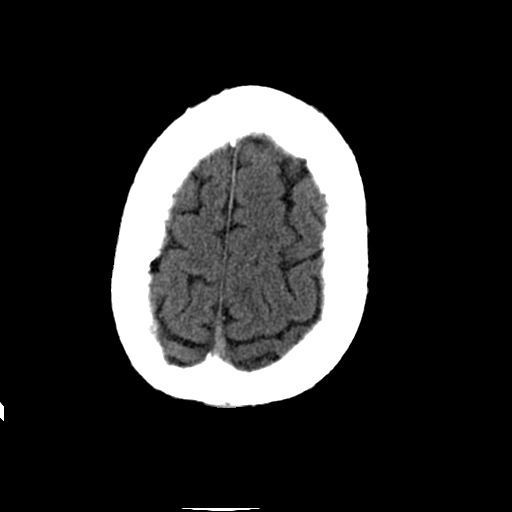
[im 27/32  brain]
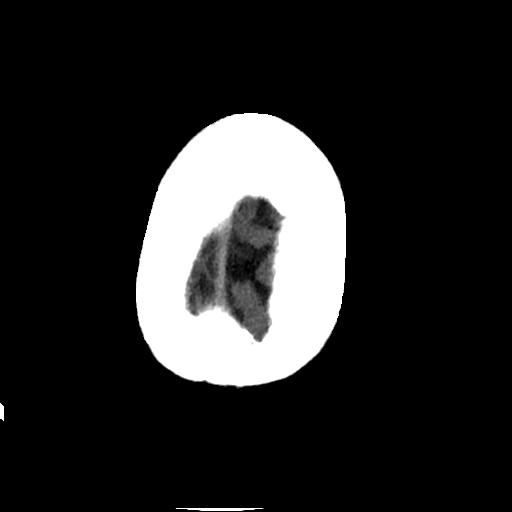
[im 29/32  brain]
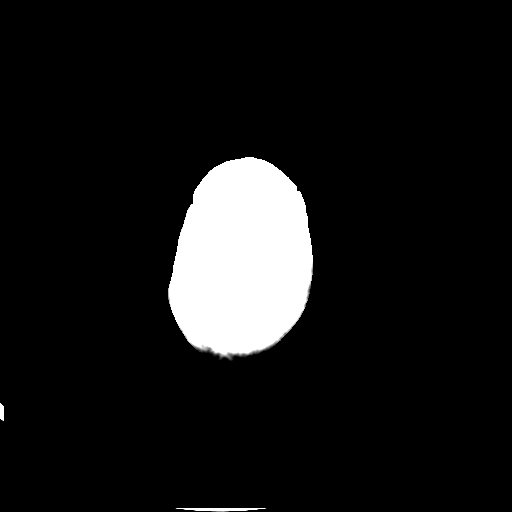
[im 29/32  bone]
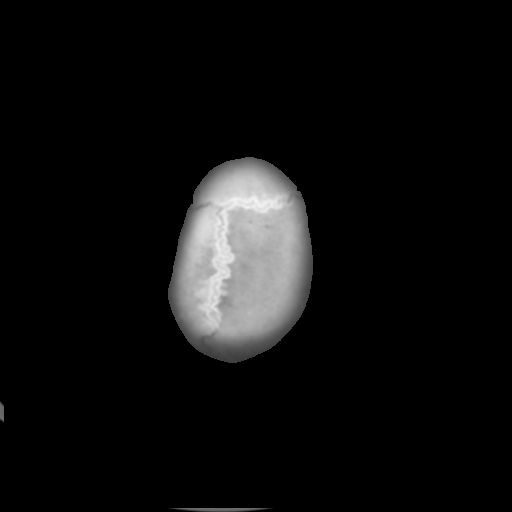

[Series 202: head w/o bone, idose (1) · axial · non-contrast · 0.44mm/px · z∈[+55,+75]mm · 2 of 32 slices shown]
[im 3/32  bone]
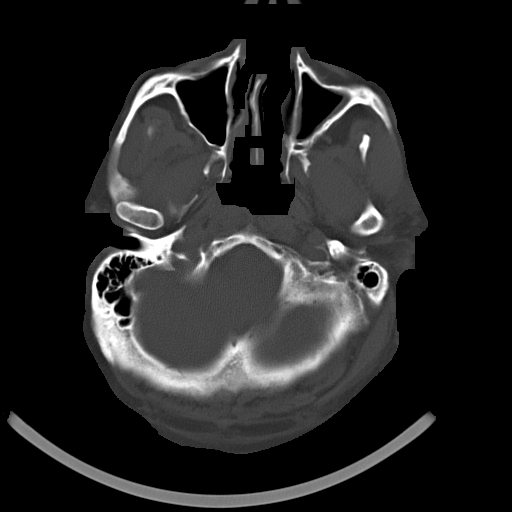
[im 7/32  bone]
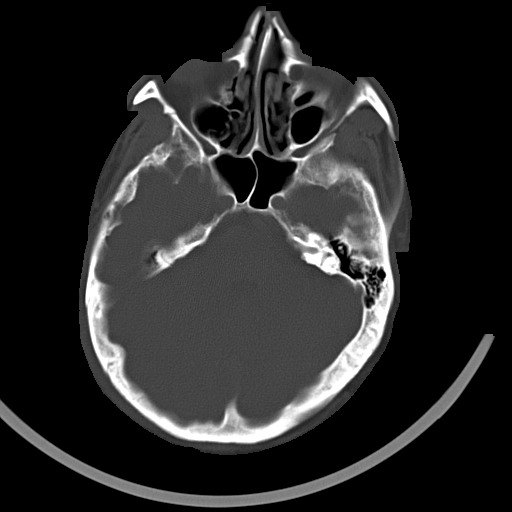

[15 of 30 positions shown; findings below may reference images not displayed]

FINDINGS: There is no evidence of acute infarction, mass lesion, or intra- or
extra-axial hemorrhage on CT.

Mild subcortical white matter change likely reflects small vessel
ischemic microangiopathy.

The posterior fossa, including the cerebellum, brainstem and fourth
ventricle, is within normal limits. The third and lateral
ventricles, and basal ganglia are unremarkable in appearance. The
cerebral hemispheres are symmetric in appearance, with normal
gray-white differentiation. No mass effect or midline shift is seen.

There is no evidence of fracture; visualized osseous structures are
unremarkable in appearance. The orbits are within normal limits.
Mild mucosal thickening is noted at the left maxillary sinus. The
remaining paranasal sinuses and mastoid air cells are well-aerated.
No significant soft tissue abnormalities are seen.
IMPRESSION: 1. No acute intracranial pathology seen on CT.
2. Mild small vessel ischemic microangiopathy.
3. Mild mucosal thickening at the left maxillary sinus.

## 2015-11-04 IMAGING — DX DG CHEST 2V
2 series · 2 of 2 positions shown · non-contrast
Comparison: Chest x-ray [DATE].

CLINICAL DATA: 54-year-old male with history of chest pain and
shortness breath. Right arm pain for the past several hours.

EXAM:
CHEST  2 VIEW

[w chest pa]
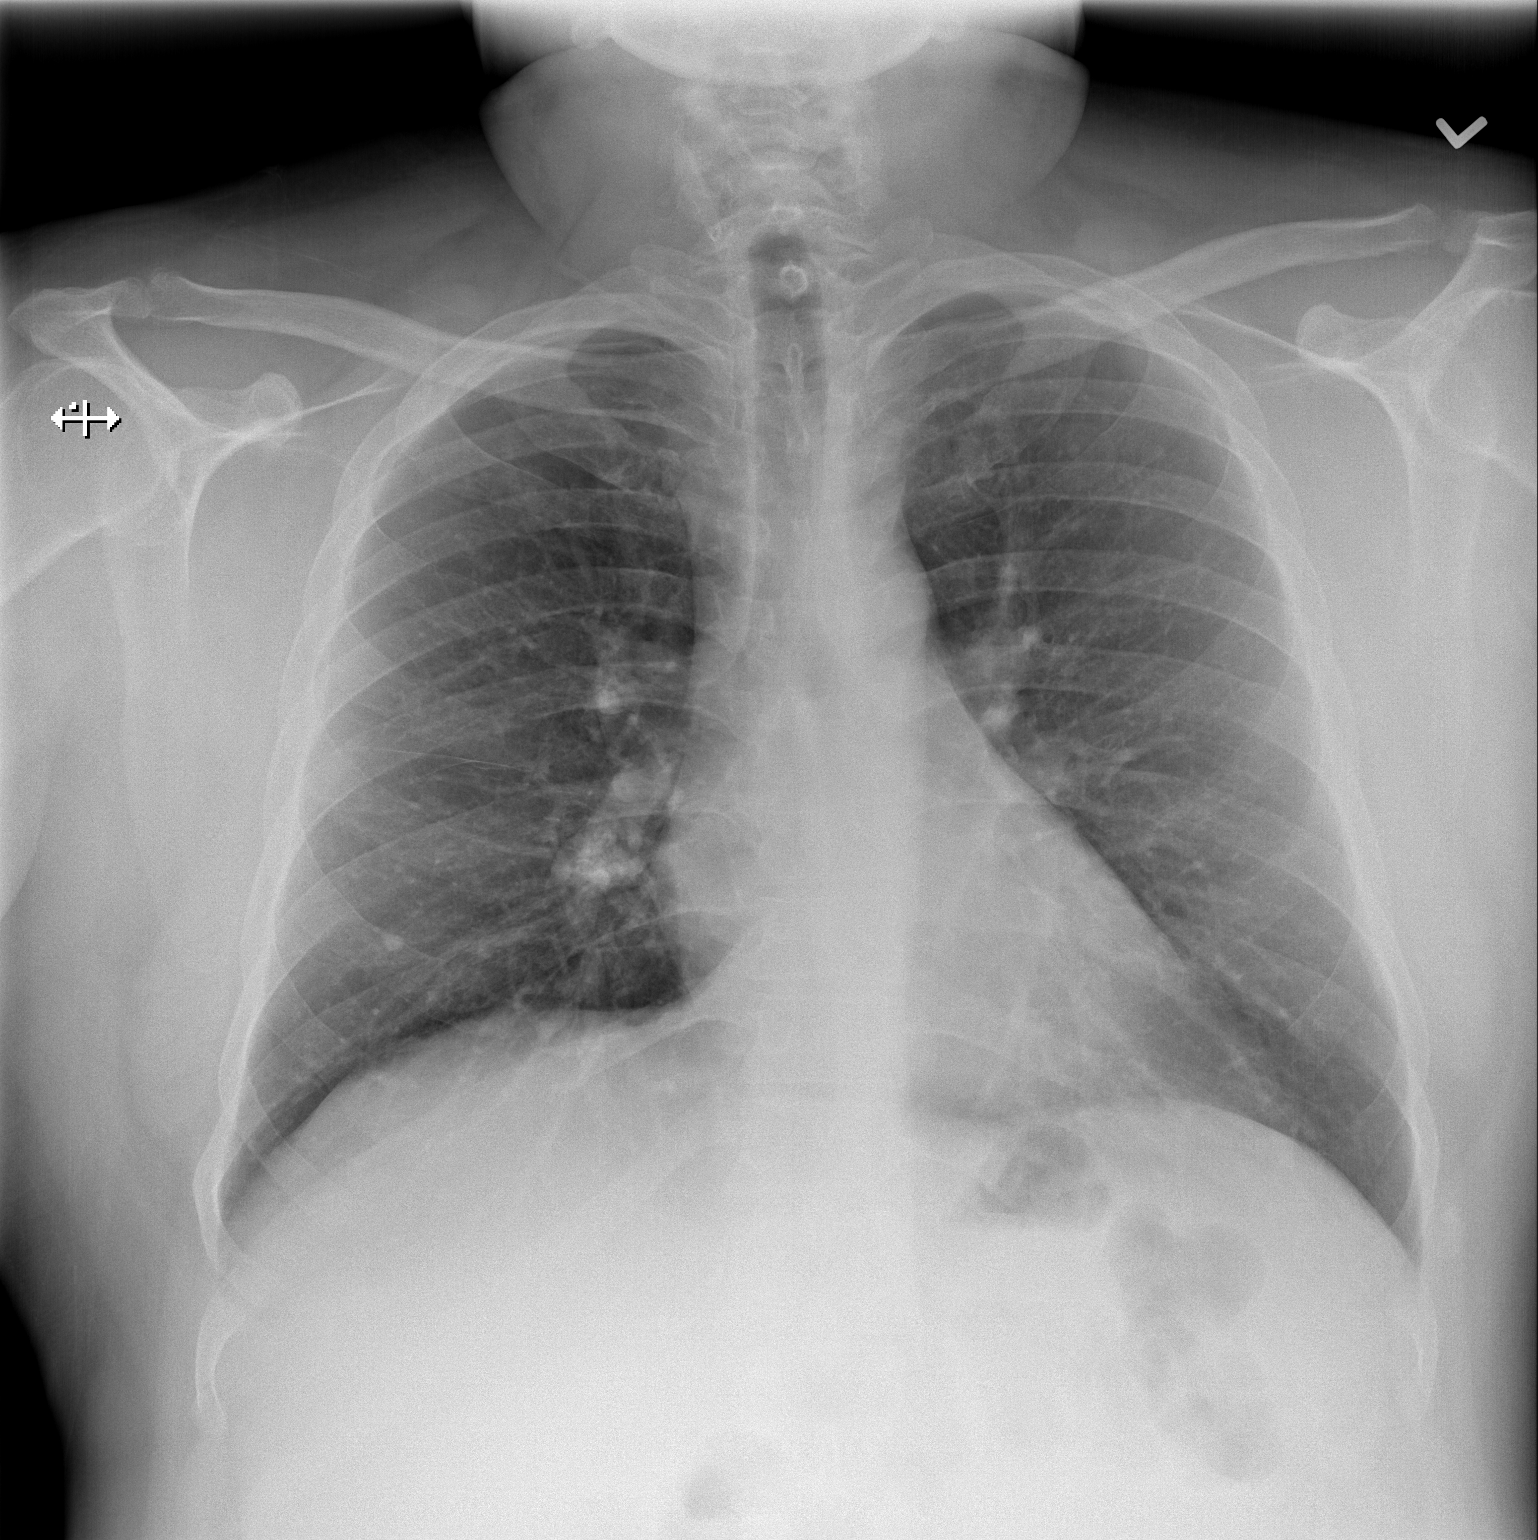

[w chest lat]
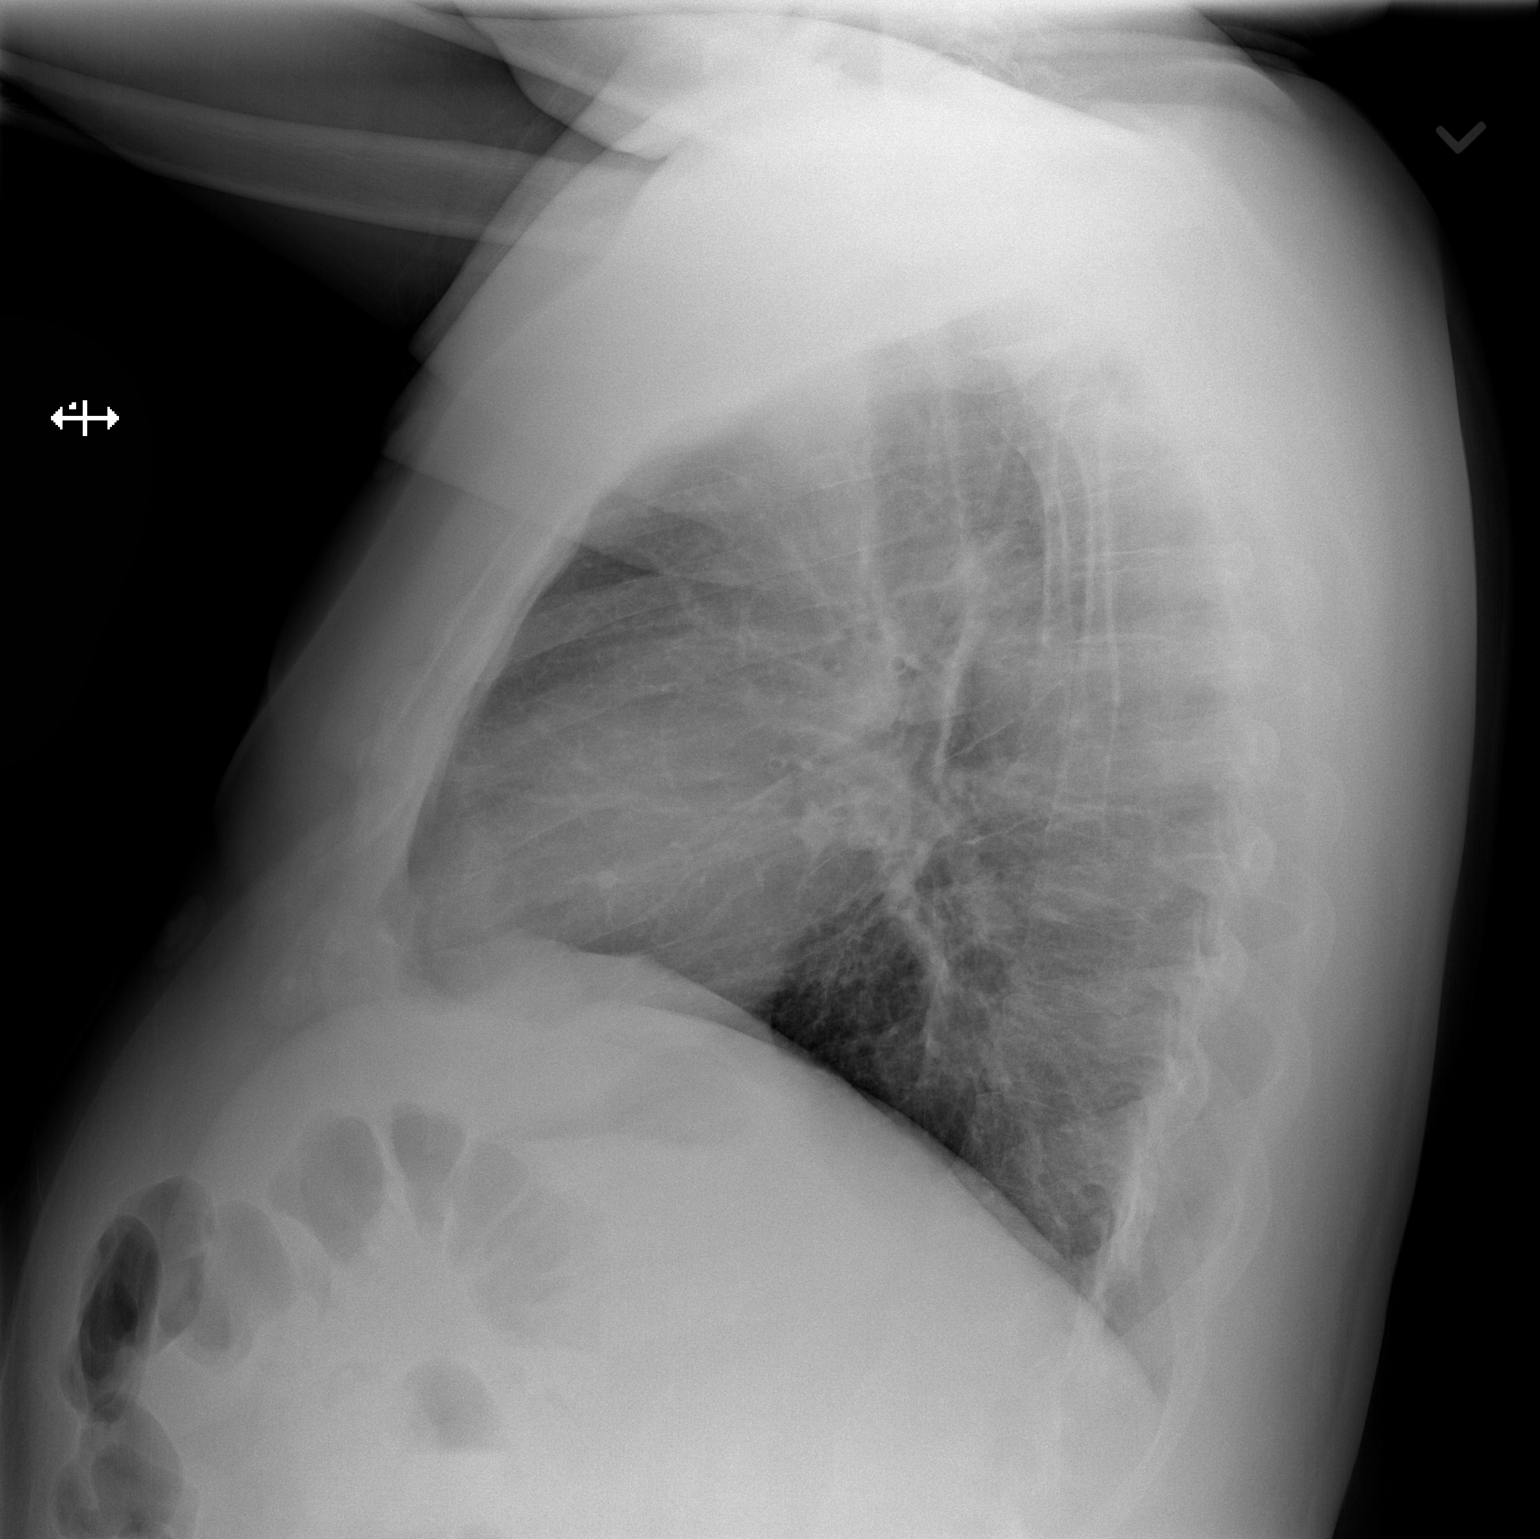

[2 of 2 positions shown; findings below may reference images not displayed]

FINDINGS: Lung volumes are normal. No consolidative airspace disease. No
pleural effusions. No pneumothorax. Small calcified granuloma in the
right middle lobe is unchanged. No other suspicious appearing
pulmonary nodule or mass noted. Pulmonary vasculature and the
cardiomediastinal silhouette are within normal limits. Several
calcified right hilar lymph nodes again noted.
IMPRESSION: 1. No radiographic evidence of acute cardiopulmonary disease.
2. Sequela of old granulomatous disease redemonstrated, as above.

## 2015-11-04 MED ORDER — HYDROCHLOROTHIAZIDE 25 MG PO TABS: 25.0000 mg | ORAL_TABLET | Freq: Every day | ORAL | Status: DC

## 2015-11-04 MED ORDER — KETOROLAC TROMETHAMINE 60 MG/2ML IM SOLN
60.0000 mg | Freq: Once | INTRAMUSCULAR | Status: AC
  Administered 2015-11-04: 60 mg via INTRAMUSCULAR

## 2015-11-04 MED ORDER — KETOROLAC TROMETHAMINE 60 MG/2ML IM SOLN
INTRAMUSCULAR | Status: AC
  Filled 2015-11-04: qty 2

## 2015-11-04 NOTE — ED Notes (Signed)
Pt c/o L sided CP, pt reports feeling like his heart is racing & makes him feel dizzy & unsteady on his feet, pt c/o HA both symptoms yesterday @ 15:00, denies n/v/d, pt deneis SOB, A&O x4

## 2015-11-04 NOTE — ED Provider Notes (Addendum)
CSN: [id number]     Arrival date & time 11/04/15  1518 History   First MD Initiated Contact with Patient 11/04/15 1549     Chief Complaint  Patient presents with  . Headache   (Consider location/radiation/quality/duration/timing/severity/associated sxs/prior Treatment) HPI He is a 54 year old man with a history of diabetes here for evaluation of headache. He states that yesterday he developed a headache in his temples and back of the head. It comes and goes. It is associated with blurred vision. He also reports a funny feeling in his heart. He denies any chest pain. No shortness of breath. No focal numbness, tingling, or weakness. He was seen here for similar symptoms 2-1/2 months ago. At that time he was treated with Toradol.  He states his symptoms did resolve after a few days.  Past Medical History  Diagnosis Date  . Hypertension 11/27/2011  . Diabetes mellitus   . Hypercholesterolemia   . Lymphoma [facility])     gets annual chemo last tx Feb 2013  . Lymphoma ([place])    History reviewed. No pertinent past surgical history. No family history on file. Social History  Substance Use Topics  . Smoking status: Former Smoker    Quit date: 12/08/2006  . Smokeless tobacco: Former Systems developer    Quit date: 11/20/2006  . Alcohol Use: No    Review of Systems As in history of present illness Allergies  Iohexol  Home Medications   Prior to Admission medications   Medication Sig Start Date End Date Taking? Authorizing Provider  glipiZIDE (GLUCOTROL) 10 MG tablet Take 1 tablet (10 mg total) by mouth 2 (two) times daily before a meal. 09/07/15   Alyssa A Haney, MD  ibuprofen (ADVIL,MOTRIN) 800 MG tablet Take 1 tablet (800 mg total) by mouth every 8 (eight) hours as needed for headache. 08/14/15   Melony Overly, MD  metFORMIN (GLUCOPHAGE) 1000 MG tablet Take 1 tablet (1,000 mg total) by mouth 2 (two) times daily with a meal. 04/27/15   Renee A Kuneff, DO  tadalafil (CIALIS) 20 MG tablet TAKE ONE TABLET BY  MOUTH EVERY DAY AS NEEDED FOR  ERECTILE  DYSFUNCTION 07/12/15   Veatrice Bourbon, MD   Meds Ordered and Administered this Visit   Medications  ketorolac (TORADOL) injection 60 mg (60 mg Intramuscular Given 11/04/15 1633)    BP 172/97 mmHg  Pulse 98  Temp(Src) 97.8 F (36.6 C) (Oral)  Resp 20  SpO2 96% No data found.   Physical Exam  Constitutional: He is oriented to person, place, and time. He appears well-developed and well-nourished. No distress.  Eyes: EOM are normal. Pupils are equal, round, and reactive to light.  Neck: Neck supple.  Cardiovascular: Normal rate, regular rhythm and normal heart sounds.   No murmur heard. Pulmonary/Chest: Effort normal and breath sounds normal. No respiratory distress. He has no wheezes. He has no rales.  Neurological: He is alert and oriented to person, place, and time. No cranial nerve deficit. He exhibits normal muscle tone. Coordination normal.    ED Course  Procedures (including critical care time) ED ECG REPORT   Date: 11/04/2015  Rate: 85  Rhythm: normal sinus rhythm  QRS Axis: normal  Intervals: normal  ST/T Wave abnormalities: normal  Conduction Disutrbances:none  Narrative Interpretation: normal ekg  Old EKG Reviewed: unchanged  I have personally reviewed the EKG tracing and agree with the computerized printout as noted.   Labs Review Labs Reviewed - No data to display  Imaging Review No results  found.    MDM   1. Headache, unspecified headache type   2. Elevated BP   3. Palpitations   4. Blurred vision    EKG is normal here. Toradol given prior to transfer. Given that his blood pressure is elevated with headache, blurred vision and poorly defined palpitations, will transfer to [facility] via shuttle for additional evaluation.    Melony Overly, MD 11/04/15 [id number]  Melony Overly, MD 11/13/15 [phone]

## 2015-11-04 NOTE — ED Provider Notes (Signed)
CSN: [id number]     Arrival date & time 11/04/15  1704 History   First MD Initiated Contact with Patient 11/04/15 [place]     Chief Complaint  Patient presents with  . Chest Pain  . Headache     (Consider location/radiation/quality/duration/timing/severity/associated sxs/prior Treatment) Patient is a 54 y.o. male presenting with chest pain and headaches. A language interpreter was used.  Chest Pain Pain location:  Substernal area Pain quality: aching   Progression:  Resolved Chronicity:  Recurrent Context: not breathing, not lifting and no movement   Context comment:  High blood pressure Relieved by:  None tried Worsened by:  Nothing tried Ineffective treatments:  None tried Associated symptoms: headache   Associated symptoms: no abdominal pain, no cough, no dizziness, no dysphagia, no fever, no nausea, no orthopnea and no shortness of breath   Headache Associated symptoms: no abdominal pain, no congestion, no cough, no diarrhea, no dizziness, no ear pain, no eye pain, no fever, no nausea and no photophobia     Past Medical History  Diagnosis Date  . Hypertension 11/27/2011  . Diabetes mellitus   . Hypercholesterolemia   . Lymphoma [facility])     gets annual chemo last tx Feb 2013  . Lymphoma ([place])    No past surgical history on file. No family history on file. Social History  Substance Use Topics  . Smoking status: Former Smoker    Quit date: 12/08/2006  . Smokeless tobacco: Former Systems developer    Quit date: 11/20/2006  . Alcohol Use: No    Review of Systems  Constitutional: Negative for fever and chills.  HENT: Negative for congestion, ear pain and trouble swallowing.   Eyes: Negative for photophobia and pain.  Respiratory: Negative for cough and shortness of breath.   Cardiovascular: Positive for chest pain. Negative for orthopnea.  Gastrointestinal: Negative for nausea, abdominal pain, diarrhea and constipation.  Neurological: Positive for headaches. Negative for  dizziness.  All other systems reviewed and are negative.     Allergies  Iohexol  Home Medications   Prior to Admission medications   Medication Sig Start Date End Date Taking? Authorizing Provider  glipiZIDE (GLUCOTROL) 10 MG tablet Take 1 tablet (10 mg total) by mouth 2 (two) times daily before a meal. 09/07/15  Yes Alyssa A Haney, MD  metFORMIN (GLUCOPHAGE) 1000 MG tablet Take 1 tablet (1,000 mg total) by mouth 2 (two) times daily with a meal. 04/27/15  Yes Renee A Kuneff, DO  tadalafil (CIALIS) 20 MG tablet TAKE ONE TABLET BY MOUTH EVERY DAY AS NEEDED FOR  ERECTILE  DYSFUNCTION Patient taking differently: Take 10 mg by mouth daily as needed for erectile dysfunction.  07/12/15  Yes Alyssa A Lincoln Brigham, MD  hydrochlorothiazide (HYDRODIURIL) 25 MG tablet Take 1 tablet (25 mg total) by mouth daily. 11/04/15   Corene Cornea Alyvia Derk, MD   BP 119/90 mmHg  Pulse 70  Temp(Src) 98.8 F (37.1 C) (Oral)  Resp 23  Ht 5\' 5"  (1.651 m)  Wt 188 lb (85.276 kg)  BMI 31.28 kg/m2  SpO2 99% Physical Exam  Constitutional: He is oriented to person, place, and time. He appears well-developed and well-nourished.  HENT:  Head: Normocephalic and atraumatic.  Eyes: Conjunctivae are normal. Pupils are equal, round, and reactive to light.  Neck: Normal range of motion.  Cardiovascular: Normal rate.   Pulmonary/Chest: Effort normal and breath sounds normal. No respiratory distress.  Abdominal: Soft. He exhibits no distension.  Musculoskeletal: Normal range of motion.  Neurological: He is alert  and oriented to person, place, and time.  No altered mental status, able to give full seemingly accurate history.  Face is symmetric, EOM's intact, pupils equal and reactive, vision intact, tongue and uvula midline without deviation Upper and Lower extremity motor 5/5, intact pain perception in distal extremities, 2+ reflexes in biceps, patella and achilles tendons. Finger to nose normal, heel to shin normal. Walks without  assistance or evident ataxia.   Skin: Skin is warm and dry.  Nursing note and vitals reviewed.   ED Course  Procedures (including critical care time) Labs Review Labs Reviewed  BASIC METABOLIC PANEL - Abnormal; Notable for the following:    Sodium 133 (*)    Glucose, Bld 126 (*)    BUN 22 (*)    All other components within normal limits  CBC  TROPONIN I  Randolm Idol, ED    Imaging Review Dg Chest 2 View  11/04/2015  CLINICAL DATA:  54 year old male with history of chest pain and shortness breath. Right arm pain for the past several hours. EXAM: CHEST  2 VIEW COMPARISON:  Chest x-ray 04/30/2012. FINDINGS: Lung volumes are normal. No consolidative airspace disease. No pleural effusions. No pneumothorax. Small calcified granuloma in the right middle lobe is unchanged. No other suspicious appearing pulmonary nodule or mass noted. Pulmonary vasculature and the cardiomediastinal silhouette are within normal limits. Several calcified right hilar lymph nodes again noted. IMPRESSION: 1. No radiographic evidence of acute cardiopulmonary disease. 2. Sequela of old granulomatous disease redemonstrated, as above. Electronically Signed   By: Vinnie Langton M.D.   On: 11/04/2015 17:55   Ct Head Wo Contrast  11/04/2015  CLINICAL DATA:  Acute onset of headache and blurred vision. Initial encounter. EXAM: CT HEAD WITHOUT CONTRAST TECHNIQUE: Contiguous axial images were obtained from the base of the skull through the vertex without intravenous contrast. COMPARISON:  Sinus radiographs performed 02/21/2012 FINDINGS: There is no evidence of acute infarction, mass lesion, or intra- or extra-axial hemorrhage on CT. Mild subcortical white matter change likely reflects small vessel ischemic microangiopathy. The posterior fossa, including the cerebellum, brainstem and fourth ventricle, is within normal limits. The third and lateral ventricles, and basal ganglia are unremarkable in appearance. The cerebral  hemispheres are symmetric in appearance, with normal gray-white differentiation. No mass effect or midline shift is seen. There is no evidence of fracture; visualized osseous structures are unremarkable in appearance. The orbits are within normal limits. Mild mucosal thickening is noted at the left maxillary sinus. The remaining paranasal sinuses and mastoid air cells are well-aerated. No significant soft tissue abnormalities are seen. IMPRESSION: 1. No acute intracranial pathology seen on CT. 2. Mild small vessel ischemic microangiopathy. 3. Mild mucosal thickening at the left maxillary sinus. Electronically Signed   By: Garald Balding M.D.   On: 11/04/2015 19:25   I have personally reviewed and evaluated these images and lab results as part of my medical decision-making.   EKG Interpretation   Date/Time:  Sunday November 04 2015 17:04:05 EST Ventricular Rate:  83 PR Interval:  146 QRS Duration: 86 QT Interval:  364 QTC Calculation: 427 R Axis:   81 Text Interpretation:  Normal sinus rhythm Normal ECG Confirmed by Genevieve Ritzel  MD, Corene Cornea [id number]) on 11/04/2015 6:35:15 PM      MDM   Final diagnoses:  Secondary hypertension, unspecified   54 yo M here with headache likely 2/2 hypertension that resolved prior to arrival here. No recurrence of same had chest pain yesterday, resolved as well, delta troponins  negative, low risk for ACS. Headache and neuro exam normal here, CT negative doubt head bleed or other significant causes. No other evidence of end organ damage 2/2 htn. Considered starting HCTZ however, BP normal prior to discharge. Considered pheochromocytoma however no flushing, tachycardia with elevated BP and headache so was felt to be less likely.       Merrily Pew, MD 11/05/15 [id number]

## 2015-11-04 NOTE — ED Notes (Signed)
C/o HA onset yest associated w/BV, palpitations, and fatigue Denies CP, dyspnea A&O x4... No acute distress.

## 2015-11-04 NOTE — ED Notes (Signed)
Pt stable, ambulatory, states understanding of discharge instructions 

## 2015-11-04 NOTE — Discharge Instructions (Signed)
°Emergency Department Resource Guide °1) Find a Doctor and Pay Out of Pocket °Although you won't have to find out who is covered by your insurance plan, it is a good idea to ask around and get recommendations. You will then need to call the office and see if the doctor you have chosen will accept you as a new patient and what types of options they offer for patients who are self-pay. Some doctors offer discounts or will set up payment plans for their patients who do not have insurance, but you will need to ask so you aren't surprised when you get to your appointment. ° °2) Contact Your Local Health Department °Not all health departments have doctors that can see patients for sick visits, but many do, so it is worth a call to see if yours does. If you don't know where your local health department is, you can check in your phone book. The CDC also has a tool to help you locate your state's health department, and many state websites also have listings of all of their local health departments. ° °3) Find a Walk-in Clinic °If your illness is not likely to be very severe or complicated, you may want to try a walk in clinic. These are popping up all over the country in pharmacies, drugstores, and shopping centers. They're usually staffed by nurse practitioners or physician assistants that have been trained to treat common illnesses and complaints. They're usually fairly quick and inexpensive. However, if you have serious medical issues or chronic medical problems, these are probably not your best option. ° °No Primary Care Doctor: °- Call [facility] at  832-8000 - they can help you locate a primary care doctor that  accepts your insurance, provides certain services, etc. °- Physician Referral Service- 1-800-533-3463 ° °Chronic Pain Problems: °Organization         Address  Phone   Notes  °[facility]  (336) 297-2271 Patients need to be referred by their primary care doctor.  ° °Medication  Assistance: °Organization         Address  Phone   Notes  °[place] [facility] [address] °[place], [place] [address] (336) 641-8030 --Must be a resident of [place] °-- Must have NO insurance coverage whatsoever (no Medicaid/ Medicare, etc.) °-- The pt. MUST have a primary care doctor that directs their care regularly and follows them in the community °  °[facility]  (866) 331-1348   °[employer]  (888) 892-1162   ° °Agencies that provide inexpensive medical care: °Organization         Address  Phone   Notes  °[facility]  (336) 832-8035   °[facility]    (336) 832-7272   °[facility] [address] °[place], [place] [address] (336) 832-4777   °[facility] [address], °[place] (336) 271-4999   °[facility]    (336) 373-0678   °[facility]    (336) 272-1050   °[facility] ° [address], [place] Phone:  (336) 832-4444, Fax:  (336) 832-4440 Hours of Operation:  9 am - 6 pm, M-F.  Also accepts Medicaid/Medicare and self-pay.  °[facility] ° [address], [place] Phone: (336) 832-3150, Fax: (336) [phone]. Hours of Operation:  8:30 am - 5:30 pm, M-F.  Also accepts Medicaid and self-pay.  °[facility] [place] [address], [place] Phone: (336) 878-6027   °[facility] [address], [place], [place] (336)723-1848, Ext. 123 Mondays & Thursdays: 7-9 AM.  First 15 patients are seen on a first come, first serve basis. °  ° °Medicaid-accepting [place] Providers: ° °Organization         Address  Phone   Notes  °[facility] [address], [place] (336) 641-2100 Also accepts self-pay patients.  °[facility] [address], [place] ° (336) 856-9996   °[facility] [address], [place]  (336) 288-8857   °[facility] [address] [place] [address], [place] (336) 299-7000   °Veita Bland [address], [place]  ° (336) 373-1557 Only accepts [place] Access Medicaid patients after they have their name applied to their card.  ° °Self-Pay (no insurance) in [place]: ° °Organization         Address  Phone   Notes  °Sickle Cell Patients, [facility] [address], [place] (336) 832-1970   °[facility] [address], [place] (336) 832-4400   °[facility] ° [address], [place] (336) 992-4800   °[facility]/Dr. Osei-Bonsu ° [address] [place] [address], [place] or [address], [place] (336) [phone] Phone number for both [place] and [place] locations is the same.  °[facility] [address], [place] (336) 299-0000   °[facility] [address] [place] [address], [place] or [address] (336) 852-7530 °(336) 878-2260   °[facility] [address], [place] (336) 350-1642, phone; (336) 294-5005, fax Sees patients 1st and 3rd Saturday of every month.  Must not qualify for public or private insurance (i.e. Medicaid, Medicare, [place] Health Choice, Veterans' Benefits) • Household income should be no more than 200% of the poverty level •The clinic cannot treat you if you are pregnant or think you are pregnant • Sexually transmitted diseases are not treated at the clinic.  ° ° °Dental Care: °Organization         Address  Phone  Notes  °[place] [facility] [address],  (336) 641-6152 Accepts children up to age 21 who are enrolled in Medicaid or [place] Health Choice; pregnant women with a Medicaid card; and children who have applied for Medicaid or [place] Health Choice, but were declined, whose parents can pay a reduced fee at time of service.  °[place]  [facility] [place]  [address], [place] (336) 641-7733 Accepts children up to age 21 who are enrolled in Medicaid or [place] Health Choice; pregnant women with a Medicaid card; and children who have applied for Medicaid or [place] Health Choice, but were declined, whose parents can pay a reduced fee at time of service.  °[facility] ° [address],  (336) 641-4533 Patients are seen by appointment only. Walk-ins are not accepted. [facility] will see patients 18 years of age and older. °Monday - Tuesday (8am-5pm) °Most Wednesdays (8:30-5pm) °$30 per visit, cash only  °[facility] ° [address], [place] (336) 641-4533 Patients are seen by appointment only. Walk-ins are not accepted. [facility] will see patients 18 years of age and older. °One   Wednesday Evening (Monthly: Volunteer Based).  $30 per visit, cash only  °[facility]  (919) 537-3737 for adults; Children under age 4, call Graduate Pediatric Dentistry at ([phone]) [phone]. Children aged 4-14, please call (919) [phone] to request a pediatric application. ° Dental services are provided in all areas of dental care including fillings, crowns and bridges, complete and partial dentures, implants, gum treatment, root canals, and extractions. Preventive care is also provided. Treatment is provided to both adults and children. °Patients are selected via a lottery and there is often a waiting list. °  °[facility] [address], °[place] ° (336) 763-8833 [email or web address] °  °[facility] [address], [place], [place] (336)723-1848, Ext. 123 Second and Fourth Thursday of each month, opens at 6:30 AM; Clinic ends at 9 AM.  Patients are seen on a first-come first-served basis, and a limited number are seen during each clinic.  ° °[facility] ° [address], [place], [place] (336) 723-7904    Eligibility Requirements °You must have lived in [place], [place], or [place] counties for at least the last three months. °  You cannot be eligible for state or federal sponsored healthcare insurance, including Veterans Administration, Medicaid, or Medicare. °  You generally cannot be eligible for healthcare insurance through your employer.  °  How to apply: °Eligibility screenings are held every Tuesday and Wednesday afternoon from 1:00 pm until 4:00 pm. You do not need an appointment for the interview!  °[facility] [address], [place], [place] 336-631-2330   °[facility]  336-342-8273   °[facility]  336-703-3100   °[facility]  336-570-6415   ° °Behavioral Health Resources in the Community: °Intensive Outpatient Programs °Organization         Address  Phone  Notes  °[facility] [address], [place], [place] 336-878-6098   °[facility] [address], [place], [place] 336-832-9800   °[facility] [address], [place], [place] ° 336-882-2125   °[facility] [address],  °[place], [place] 1-800-853-5163 or 336-641-4981   °Substance Abuse Resources °Organization         Address  Phone  Notes  °[facility]  336-882-2125   °[facility]  336-784-9470   °[facility]  336-285-9073   °[facility]  336-845-3988   °Residential & Outpatient Substance Abuse Program  1-800-659-3381   °Psychological Services °Organization         Address  Phone  Notes  °[facility]  336- 832-9600   °[facility]  336- 378-7881   °[facility] [address], [place] 1-800-853-5163 or 336-641-4981   ° °Mobile Crisis Teams °Organization         Address  Phone  Notes  °[facility], Mobile Crisis Care Unit  1-877-626-1772   °Assertive °[facility] ° [address]  [place], [place] 336-834-9664   °Sharon DeEsch [address] °[place] 336-554-5454   ° °Self-Help/Support Groups °Organization         Address  Phone             Notes  °[facility] [place] [facility] - variety of support groups  336- 373-1402 Call for more information  °Narcotics Anonymous (NA), [facility] [address], °[place]   2 meetings at this location  ° °  Residential Treatment Programs °Organization         Address  Phone  Notes  °[facility] [address],    °[place]  1-866-801-8205   °[facility] ° [address], [place], [place] 704-293-8524   °[facility] [address], [place] 336-845-3988 Admissions: 8am-3pm M-F  °[facility] [address]    °[place], [place] 336-841-1104   °[facility] [address], [place], [place] 336-379-7146   °[facility] [address]  °[place], [place] 336-285-9073   °[facility] - Intensive Outpatient [address], [place], [place] 336-852-3033   °[facility] [address]  °[place], [place] 1-877-615-2722 or 336-784-9470   °[facility] [address] [place], [place] 336-227-7417 Accepts Medicaid  °[facility] [address]  °[place] 1-800-659-3381 Substance Abuse/Addiction Treatment  ° °[place] Behavioral Health Resources °Organization         Address  Phone  Notes  °[facility]  (888) 581-9988   °Julie Brannon, PhD [address] [place], [place]   (336) 349-5553 or (336) 951-0000   °[facility]   [address] °[place], [place] (336) 349-4454   °[facility] [address], [place], [place] (336) 342-8316 Insurance/Medicaid/sponsorship through [facility]  °[facility] [address]                                    [place], [place] (336) 342-8316 Therapy/tele-psych/case    °[facility] [address]  ° [place], [place] (336) 349-2233    °Dr. Arfeen  (336) 349-4544   °[facility] [place]  [employer] [place] [facility] 1) [address], [place] °2) [address], [place] °3)  [address], [place] (336) 349-3220 °(336) 342-7768 ° °(336) 342-8140   °[place] [facility] (336) 342-1394 or (336) 342-3537 (After Hours)    ° ° °

## 2015-11-05 MED ORDER — HYDROCHLOROTHIAZIDE 25 MG PO TABS: 25.0000 mg | ORAL_TABLET | Freq: Every day | ORAL | Status: DC

## 2015-12-04 ENCOUNTER — Other Ambulatory Visit: Payer: Self-pay | Admitting: Family Medicine

## 2015-12-28 ENCOUNTER — Other Ambulatory Visit: Payer: Self-pay | Admitting: Family Medicine

## 2015-12-30 NOTE — Telephone Encounter (Signed)
Glipizide refilled

## 2016-01-07 ENCOUNTER — Other Ambulatory Visit: Payer: Self-pay | Admitting: Obstetrics and Gynecology

## 2016-02-06 ENCOUNTER — Ambulatory Visit: Payer: Self-pay | Admitting: Internal Medicine

## 2016-02-06 ENCOUNTER — Other Ambulatory Visit: Payer: Self-pay

## 2016-02-25 ENCOUNTER — Emergency Department (HOSPITAL_COMMUNITY)
Admission: EM | Admit: 2016-02-25 | Discharge: 2016-02-26 | Disposition: A | Discharge status: Home / Self Care | Payer: Self-pay | Attending: Emergency Medicine | Admitting: Emergency Medicine

## 2016-02-25 ENCOUNTER — Encounter (HOSPITAL_COMMUNITY): Payer: Self-pay | Admitting: Emergency Medicine

## 2016-02-25 DIAGNOSIS — I10 Essential (primary) hypertension: Secondary | ICD-10-CM | POA: Insufficient documentation

## 2016-02-25 DIAGNOSIS — E119 Type 2 diabetes mellitus without complications: Secondary | ICD-10-CM | POA: Insufficient documentation

## 2016-02-25 DIAGNOSIS — Z79899 Other long term (current) drug therapy: Secondary | ICD-10-CM | POA: Insufficient documentation

## 2016-02-25 DIAGNOSIS — Z8572 Personal history of non-Hodgkin lymphomas: Secondary | ICD-10-CM | POA: Insufficient documentation

## 2016-02-25 DIAGNOSIS — J40 Bronchitis, not specified as acute or chronic: Secondary | ICD-10-CM | POA: Insufficient documentation

## 2016-02-25 DIAGNOSIS — Z7984 Long term (current) use of oral hypoglycemic drugs: Secondary | ICD-10-CM | POA: Insufficient documentation

## 2016-02-25 DIAGNOSIS — R6883 Chills (without fever): Secondary | ICD-10-CM | POA: Insufficient documentation

## 2016-02-25 DIAGNOSIS — Z87891 Personal history of nicotine dependence: Secondary | ICD-10-CM | POA: Insufficient documentation

## 2016-02-25 LAB — RAPID STREP SCREEN (MED CTR MEBANE ONLY): STREPTOCOCCUS, GROUP A SCREEN (DIRECT): NEGATIVE

## 2016-02-25 NOTE — ED Notes (Signed)
Pt reports sore throat, cough x 4 days with feeling hot and cold.

## 2016-02-26 ENCOUNTER — Emergency Department (HOSPITAL_COMMUNITY): Payer: Self-pay

## 2016-02-26 IMAGING — DX DG CHEST 2V
2 series · 2 of 2 positions shown · non-contrast
Comparison: [DATE]

CLINICAL DATA: Cough, weakness, and shortness of breath for 1 week.

EXAM:
CHEST  2 VIEW

[chest pa]
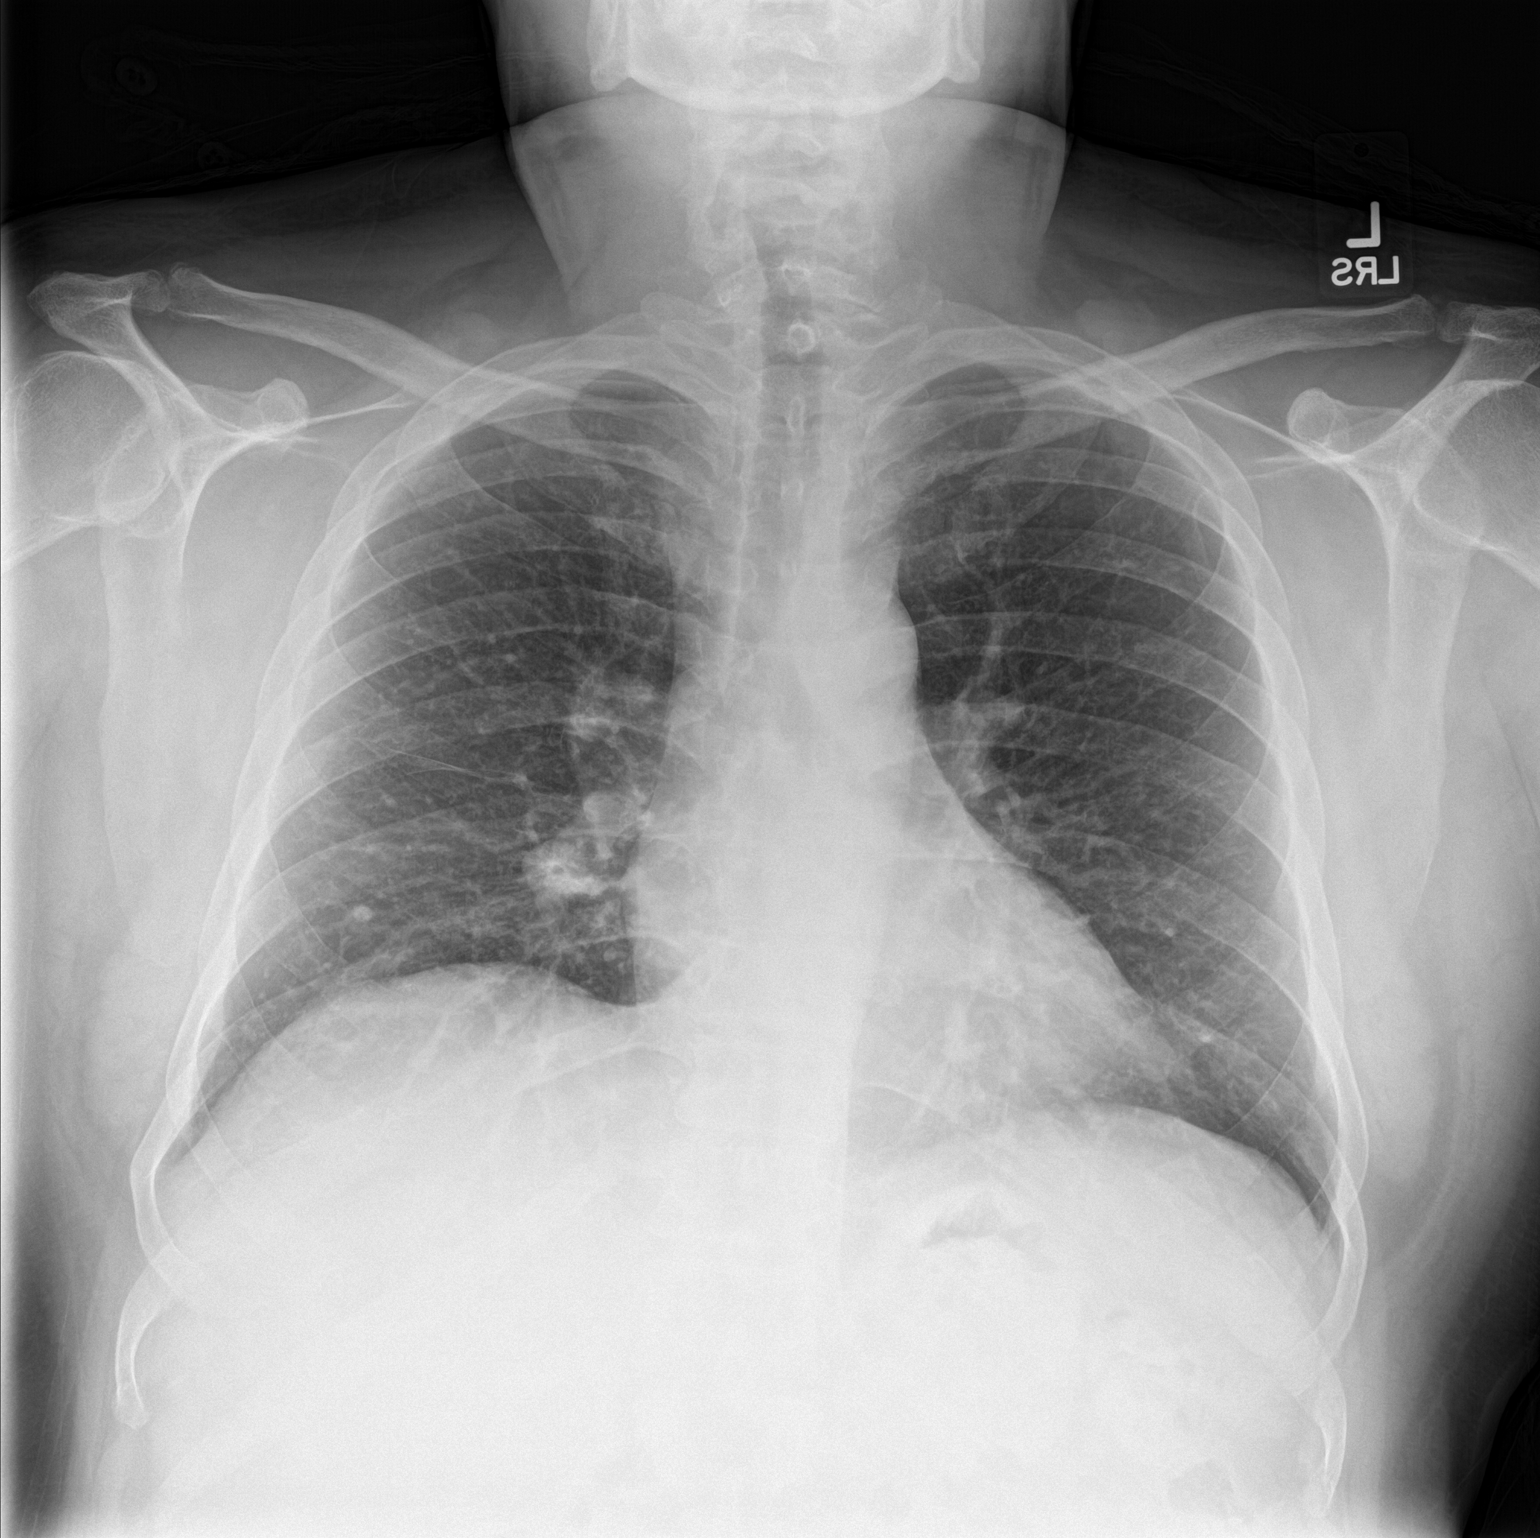

[chest lat]
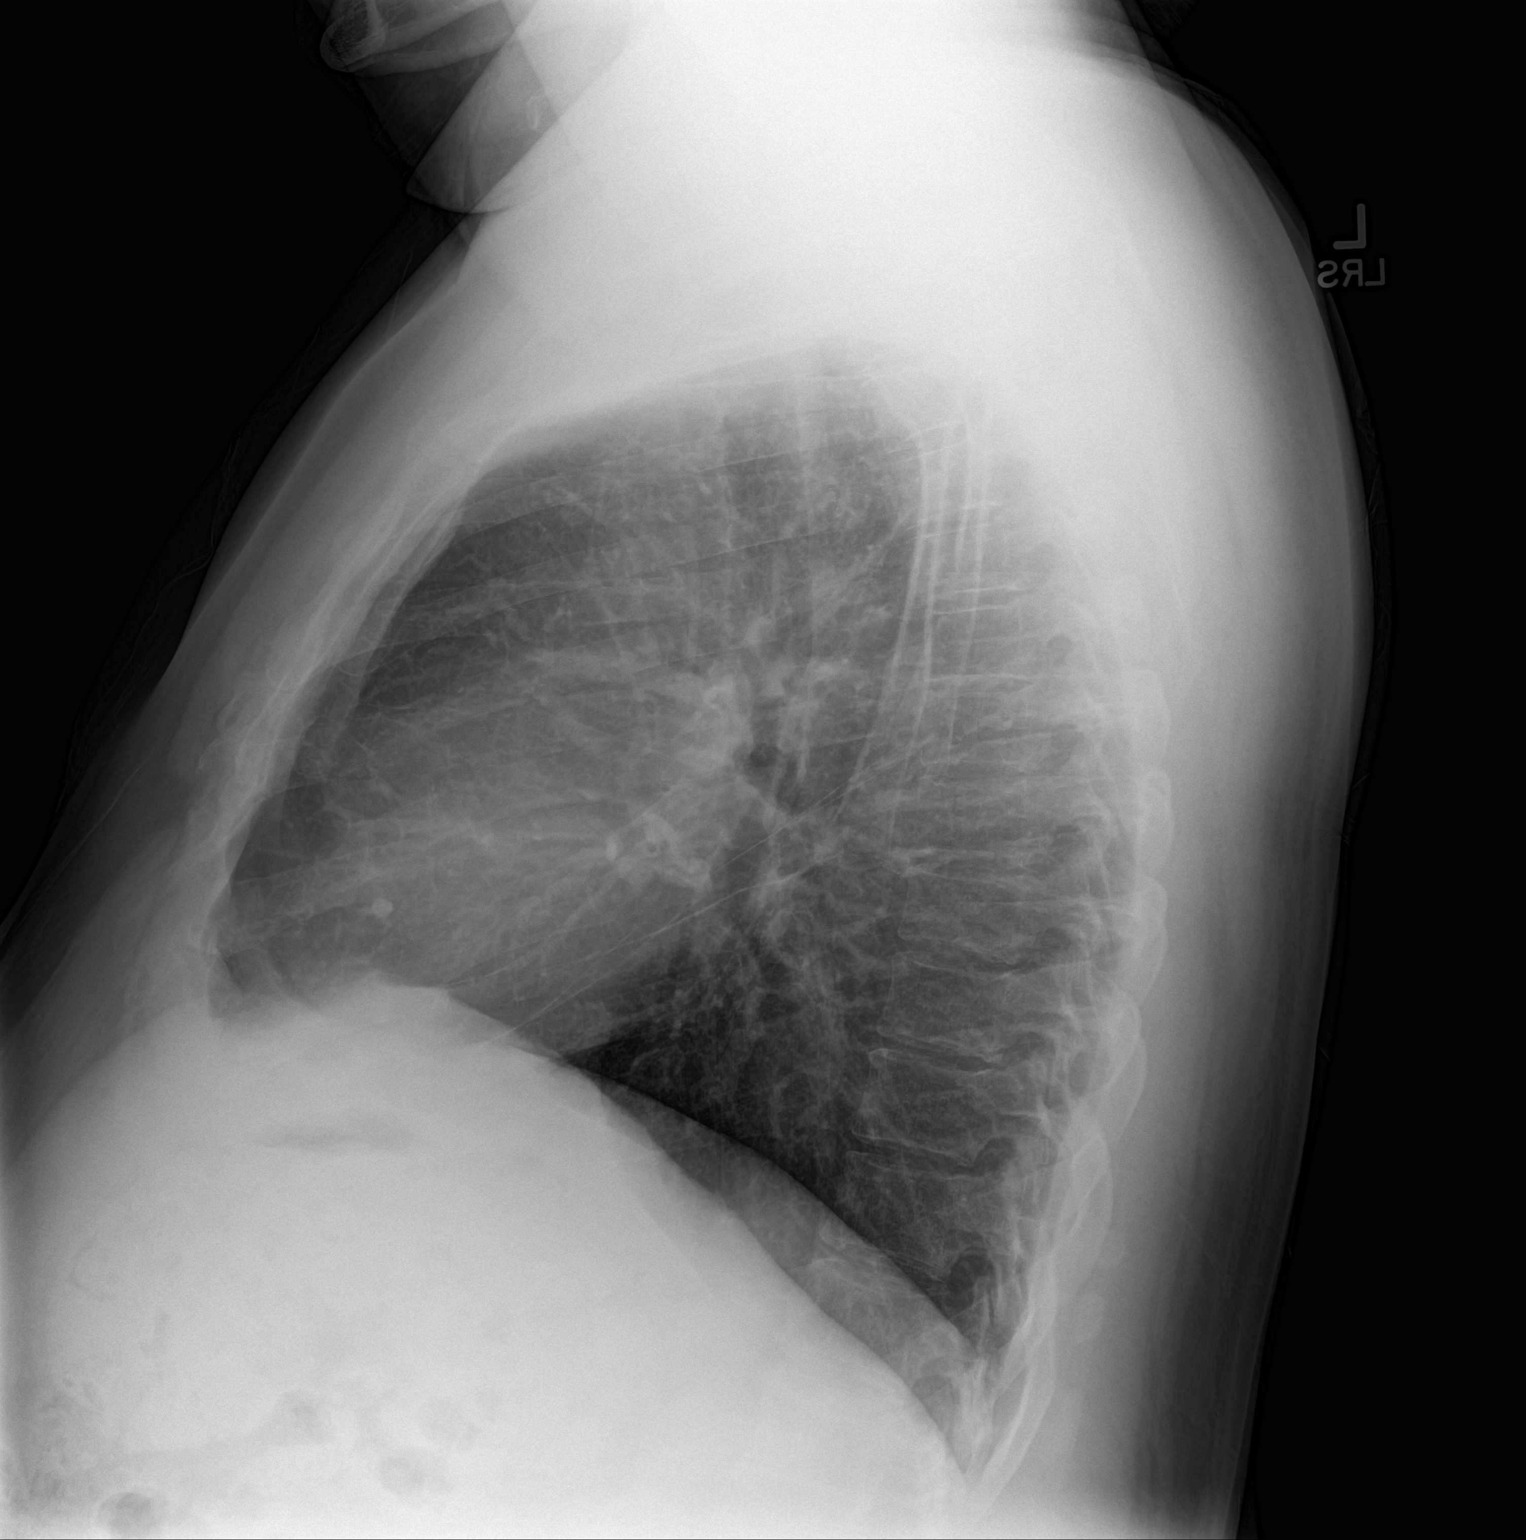

[2 of 2 positions shown; findings below may reference images not displayed]

FINDINGS: The heart size and mediastinal contours are within normal limits.
Both lungs are clear. The visualized skeletal structures are
unremarkable.
IMPRESSION: No active cardiopulmonary disease.

## 2016-02-26 MED ORDER — HYDROCODONE-HOMATROPINE 5-1.5 MG/5ML PO SYRP
5.0000 mL | ORAL_SOLUTION | Freq: Once | ORAL | Status: AC
  Administered 2016-02-26: 5 mL via ORAL
  Filled 2016-02-26: qty 5

## 2016-02-26 MED ORDER — ALBUTEROL SULFATE HFA 108 (90 BASE) MCG/ACT IN AERS
2.0000 | INHALATION_SPRAY | Freq: Once | RESPIRATORY_TRACT | Status: AC
  Administered 2016-02-26: 2 via RESPIRATORY_TRACT
  Filled 2016-02-26: qty 6.7

## 2016-02-26 MED ORDER — BENZONATATE 100 MG PO CAPS: 100.0000 mg | ORAL_CAPSULE | Freq: Three times a day (TID) | ORAL | Status: DC

## 2016-02-26 MED ORDER — NAPROXEN 500 MG PO TABS: 500.0000 mg | ORAL_TABLET | Freq: Two times a day (BID) | ORAL | Status: DC

## 2016-02-26 MED ORDER — PREDNISONE 20 MG PO TABS: 40.0000 mg | ORAL_TABLET | Freq: Every day | ORAL | Status: DC

## 2016-02-26 MED ORDER — PREDNISONE 20 MG PO TABS
60.0000 mg | ORAL_TABLET | Freq: Once | ORAL | Status: AC
  Administered 2016-02-26: 60 mg via ORAL
  Filled 2016-02-26: qty 3

## 2016-02-26 NOTE — Discharge Instructions (Signed)
Bronquitis aguda (Acute Bronchitis) La bronquitis es una inflamacin de las vas respiratorias que se extienden desde la trquea [facility] pulmones (bronquios). La inflamacin produce la formacin de mucosidad. Esto produce tos, que es el sntoma ms frecuente de la bronquitis.  Cuando la bronquitis es [place], generalmente comienza de [place] sbita y desaparece luego de un par de semanas. El hbito de fumar, las alergias y el asma pueden empeorar la bronquitis. Los episodios repetidos de bronquitis pueden causar ms problemas pulmonares.  CAUSAS La causa ms frecuente de bronquitis aguda es el mismo virus que produce el resfro. El virus puede propagarse de Ardelia Mems persona a la otra (contagioso) a travs de la tos y los estornudos, y al tocar objetos contaminados. [place].  Cristy Hilts.  Tos con mucosidad.  Dolores [employer] cuerpo.  Congestin en el pecho.  Escalofros.  Falta de aire.  Dolor de Investment banker, operational. DIAGNSTICO  La bronquitis aguda en general se diagnostica con un examen fsico. El mdico tambin le har preguntas sobre su historia clnica. En algunos casos se indican otros estudios, como radiografas, para Clinical research associate.  TRATAMIENTO  La bronquitis aguda generalmente desaparece en un par de semanas. Con frecuencia, no es Systems analyst. Los medicamentos se indican para aliviar la fiebre o la tos. Generalmente, no es necesario el uso de antibiticos, pero pueden recetarse en ciertas ocasiones. En algunos casos, se recomienda el uso de un inhalador para mejorar la falta de aire y Aeronautical engineer tos. Un vaporizador de aire fro podr ayudarlo a [employer] bronquiales y Armed forces technical officer su eliminacin.  INSTRUCCIONES PARA EL CUIDADO EN EL HOGAR  Descanse lo suficiente.  Beba lquidos en abundancia para mantener la orina de color claro o amarillo plido (excepto que padezca una enfermedad que requiera la restriccin de lquidos). El aumento  de lquidos puede ayudar a que las secreciones respiratorias (esputo) sean menos espesas y a reducir la congestin del pecho, y Mining engineer deshidratacin.  Tome los medicamentos solamente como se lo haya indicado el mdico.  Si le recetaron antibiticos, asegrese de terminarlos, incluso si comienza a sentirse mejor.  Evite fumar o aspirar el humo de otros fumadores. La exposicin al humo del cigarrillo o a irritantes qumicos har que la bronquitis empeore. Si fuma, considere el uso de goma de Higher education careers adviser o la aplicacin de parches en la piel que contengan nicotina para Public house manager los sntomas de abstinencia. Si deja de fumar, sus pulmones se curarn ms rpido.  Reduzca la probabilidad de otro episodio de bronquitis aguda lavando sus manos con frecuencia, evitando a las personas que tengan sntomas y tratando de no tocarse las manos con la boca, la nariz o los ojos.  Concurra a todas las visitas de control como se lo haya indicado el mdico. SOLICITE ATENCIN MDICA SI: Los sntomas no mejoran despus de una semana de [place].  SOLICITE ATENCIN MDICA DE INMEDIATO SI:  Comienza a tener fiebre o escalofros cada vez ms intensos.  Siente dolor en el pecho.  Le falta el aire de manera preocupante.  La flema tiene [place].  Se deshidrata.  Se desmaya o siente que va a desmayarse de forma repetida.  Tiene vmitos que se repiten.  Tiene un dolor de cabeza intenso. ASEGRESE DE QUE:   Comprende estas instrucciones.  Controlar su afeccin.  Recibir ayuda de inmediato si no mejora o si empeora.   Esta informacin no tiene Marine scientist el consejo del mdico. Asegrese de hacerle al mdico cualquier  pregunta que tenga.   Document Released: 11/24/2005 Document Revised: 12/15/2014 Elsevier Interactive Patient Education 2016 [place] en los adultos (Cough, Adult) La tos es un reflejo que limpia la garganta y las vas respiratorias, y ayuda a la curacin y Health visitor  de los pulmones. Es normal toser de Engineer, civil (consulting), pero cuando esta se presenta con otros sntomas o dura mucho tiempo puede ser el signo de una enfermedad que [place]. La tos puede durar solo 2 o 3semanas (aguda) o ms de 8semanas (crnica). CAUSAS Comnmente, las causas de la tos son las siguientes:  Advice worker sustancias que [employer].  Una infeccin respiratoria viral o bacteriana.  Alergias.  Asma.  Goteo posnasal.  Fumar.  El retroceso de cido estomacal hacia el esfago (reflujo gastroesofgico).  Algunos medicamentos.  Los problemas pulmonares crnicos, entre ellos, la enfermedad pulmonar obstructiva crnica (EPOC) (o, en contadas ocasiones, el cncer de pulmn).  Otras afecciones, como la insuficiencia cardaca. INSTRUCCIONES PARA EL CUIDADO EN EL HOGAR  Est atento a cualquier cambio en los sntomas. Tome estas medidas para Public house manager las molestias:  Tome los medicamentos solamente como se lo haya indicado el mdico.  Si le recetaron un antibitico, tmelo como se lo haya indicado el mdico. No deje de tomar los antibiticos aunque comience a sentirse mejor.  Hable con el mdico antes de tomar un antitusivo.  Beba suficiente lquido para Consulting civil engineer orina clara o de color amarillo plido.  Si el aire est seco, use un vaporizador o un humidificador con vapor fro en su habitacin o en su casa para ayudar a aflojar las secreciones.  Evite todas las cosas que le producen tos en el trabajo o en su casa.  Si la tos aumenta durante la noche, intente dormir semisentado.  Evite el humo del cigarrillo. Si fuma, deje de hacerlo. Si necesita ayuda para dejar de fumar, consulte al mdico.  Evite la cafena.  Evite el alcohol.  Descanse todo lo que sea necesario. SOLICITE ATENCIN MDICA SI:   Aparecen nuevos sntomas.  Expectora pus al toser.  La tos no mejora despus de 2 o 3semanas, o empeora.  No puede controlar la tos con antitusivos y no  puede dormir bien.  Tiene un dolor que se intensifica o que no puede Research scientist (life sciences).  Tiene fiebre.  [place] de peso sin causa aparente.  Tiene transpiracin nocturna. SOLICITE ATENCIN MDICA DE INMEDIATO SI:  Tose y escupe sangre.  Tiene dificultad para respirar.  Los latidos cardacos son muy rpidos.   Esta informacin no tiene Marine scientist el consejo del mdico. Asegrese de hacerle al mdico cualquier pregunta que tenga.   Document Released: 07/02/2011 Document Revised: 08/15/2015 Elsevier Interactive Patient Education [employer].

## 2016-02-26 NOTE — ED Notes (Signed)
Pt verbalized understanding of d/c instructions and has no further questions. Pt stable and NAD.  

## 2016-02-26 NOTE — ED Provider Notes (Signed)
CSN: [id number]     Arrival date & time 02/25/16  2130 History   First MD Initiated Contact with Patient 02/25/16 2316     Chief Complaint  Patient presents with  . Sore Throat     (Consider location/radiation/quality/duration/timing/severity/associated sxs/prior Treatment) HPI Comments: 55 year old male with past medical history of lymphoma, hypertension, diabetes, and dyslipidemia presents to the emergency department for evaluation of a cough 4 days. Patient reports that cough has been dry. He states that he feels weak and short of breath when he gets into coughing spells. Symptoms also associated with a sore throat which is worse with swallowing and coughing. He reports chills without documented fever. He denies taking any medications for symptoms. No reported vomiting, diarrhea, or syncope. No reported diaphoresis.  Patient is a 54 y.o. male presenting with pharyngitis. The history is provided by the patient. No language interpreter was used.  Sore Throat Associated symptoms include chills, coughing and a sore throat. Pertinent negatives include no diaphoresis, fever or vomiting.    Past Medical History  Diagnosis Date  . Hypertension 11/27/2011  . Diabetes mellitus   . Hypercholesterolemia   . Lymphoma [facility])     gets annual chemo last tx Feb 2013  . Lymphoma ([place])    History reviewed. No pertinent past surgical history. No family history on file. Social History  Substance Use Topics  . Smoking status: Former Smoker    Quit date: 12/08/2006  . Smokeless tobacco: Former Systems developer    Quit date: 11/20/2006  . Alcohol Use: No    Review of Systems  Constitutional: Positive for chills. Negative for fever and diaphoresis.  HENT: Positive for sore throat.   Respiratory: Positive for cough.   Gastrointestinal: Negative for vomiting and diarrhea.  Neurological: Negative for syncope.  All other systems reviewed and are negative.   Allergies  Iohexol  Home Medications   Prior  to Admission medications   Medication Sig Start Date End Date Taking? Authorizing Provider  glipiZIDE (GLUCOTROL) 10 MG tablet Take 1 tablet (10 mg total) by mouth 2 (two) times daily before a meal. 09/07/15   Alyssa A Haney, MD  glipiZIDE (GLUCOTROL) 10 MG tablet TAKE ONE TABLET BY MOUTH TWICE DAILY BEFORE  A  MEAL 12/30/15   Alyssa A Haney, MD  hydrochlorothiazide (HYDRODIURIL) 25 MG tablet Take 1 tablet (25 mg total) by mouth daily. 11/05/15   Tatyana Kirichenko, [name]  metFORMIN (GLUCOPHAGE) 1000 MG tablet Take 1 tablet (1,000 mg total) by mouth 2 (two) times daily with a meal. 04/27/15   Renee A Kuneff, DO  metFORMIN (GLUCOPHAGE) 1000 MG tablet TAKE ONE TABLET BY MOUTH TWICE DAILY WITH A MEAL 01/08/16   Alyssa A Haney, MD  tadalafil (CIALIS) 20 MG tablet TAKE ONE TABLET BY MOUTH EVERY DAY AS NEEDED FOR  ERECTILE  DYSFUNCTION Patient taking differently: Take 10 mg by mouth daily as needed for erectile dysfunction.  07/12/15   Alyssa A Haney, MD   BP 174/98 mmHg  Pulse 96  Temp(Src) 98.4 F (36.9 C) (Oral)  Resp 22  Ht 5\' 5"  (1.651 m)  Wt 92.534 kg  BMI 33.95 kg/m2  SpO2 96%   Physical Exam  Constitutional: He is oriented to person, place, and time. He appears well-developed and well-nourished. No distress.  Nontoxic/nonseptic appearing  HENT:  Head: Normocephalic and atraumatic.  Mouth/Throat: Oropharynx is clear and moist. No oropharyngeal exudate.  Mild posterior oropharyngeal erythema. No edema or exudates. Patient tolerating secretions without difficulty.  Eyes: Conjunctivae and  EOM are normal. No scleral icterus.  Neck: Normal range of motion.  No JVD  Cardiovascular: Normal rate, regular rhythm and intact distal pulses.   Pulmonary/Chest: Effort normal. No respiratory distress. He has no wheezes. He has no rales.  Harsh, dry, nonproductive cough appreciated at bedside. Lungs clear bilaterally. No wheezes or rales noted.  Musculoskeletal: Normal range of motion.  Neurological: He  is alert and oriented to person, place, and time. He exhibits normal muscle tone. Coordination normal.  Patient moving all extremities.  Skin: Skin is warm and dry. No rash noted. He is not diaphoretic. No erythema. No pallor.  Psychiatric: He has a normal mood and affect. His behavior is normal.  Nursing note and vitals reviewed.   ED Course  Procedures (including critical care time) Labs Review Labs Reviewed  RAPID STREP SCREEN (NOT AT [facility])  CULTURE, GROUP A STREP [facility])    Imaging Review Dg Chest 2 View  02/26/2016  CLINICAL DATA:  Cough, weakness, and shortness of breath for 1 week. EXAM: CHEST  2 VIEW COMPARISON:  11/04/2015 FINDINGS: The heart size and mediastinal contours are within normal limits. Both lungs are clear. The visualized skeletal structures are unremarkable. IMPRESSION: No active cardiopulmonary disease. Electronically Signed   By: Lucienne Capers M.D.   On: 02/26/2016 00:35     I have personally reviewed and evaluated these images and lab results as part of my medical decision-making.   EKG Interpretation   Date/Time:  Tuesday February 26 2016 00:43:15 EDT Ventricular Rate:  68 PR Interval:  148 QRS Duration: 94 QT Interval:  378 QTC Calculation: 402 R Axis:   79 Text Interpretation:  Sinus rhythm No acute changes Confirmed by LIU MD,  DANA [id number]) on 02/26/2016 12:50:23 AM      MDM   Final diagnoses:  Bronchitis    55 year old male presents to the emergency department for evaluation of cough and sore throat. He has a negative strep screen as well as a negative chest x-ray. Patient afebrile and well-appearing. No hypoxia. Atypically presenting ACS considered; however, patient has no acute EKG changes. Heart score is approximated at 3 given age and risk factors, c/w low risk of ACS. Symptoms most consistent with bronchitis which will be managed on an outpatient basis with an albuterol inhaler, 5 day course of prednisone, and Tessalon for cough. Primary  care advised and return precautions given. Patient discharged in satisfactory condition with no unaddressed concerns.   Filed Vitals:   02/25/16 2142  BP: 174/98  Pulse: 96  Temp: 98.4 F (36.9 C)  TempSrc: Oral  Resp: 22  Height: 5\' 5"  (1.651 m)  Weight: 92.534 kg  SpO2: 96%     Antonietta Breach, [name] 02/26/16 0103  Forde Dandy, MD 02/26/16 0107

## 2016-02-28 LAB — CULTURE, GROUP A STREP (THRC)

## 2016-03-02 ENCOUNTER — Emergency Department (INDEPENDENT_AMBULATORY_CARE_PROVIDER_SITE_OTHER)
Admission: EM | Admit: 2016-03-02 | Discharge: 2016-03-02 | Disposition: A | Discharge status: Home / Self Care | Payer: Self-pay | Source: Home / Self Care | Attending: Emergency Medicine | Admitting: Emergency Medicine

## 2016-03-02 ENCOUNTER — Encounter (HOSPITAL_COMMUNITY): Payer: Self-pay | Admitting: Emergency Medicine

## 2016-03-02 ENCOUNTER — Emergency Department (INDEPENDENT_AMBULATORY_CARE_PROVIDER_SITE_OTHER): Payer: Self-pay

## 2016-03-02 DIAGNOSIS — J3489 Other specified disorders of nose and nasal sinuses: Secondary | ICD-10-CM

## 2016-03-02 DIAGNOSIS — J029 Acute pharyngitis, unspecified: Secondary | ICD-10-CM

## 2016-03-02 DIAGNOSIS — T148 Other injury of unspecified body region: Secondary | ICD-10-CM

## 2016-03-02 DIAGNOSIS — T148XXA Other injury of unspecified body region, initial encounter: Secondary | ICD-10-CM

## 2016-03-02 DIAGNOSIS — M546 Pain in thoracic spine: Secondary | ICD-10-CM

## 2016-03-02 IMAGING — DX DG CHEST 2V
2 series · 2 of 2 positions shown · non-contrast
Comparison: [DATE]

CLINICAL DATA: Cough, congestion for 10 days

EXAM:
CHEST  2 VIEW

[chest pa]
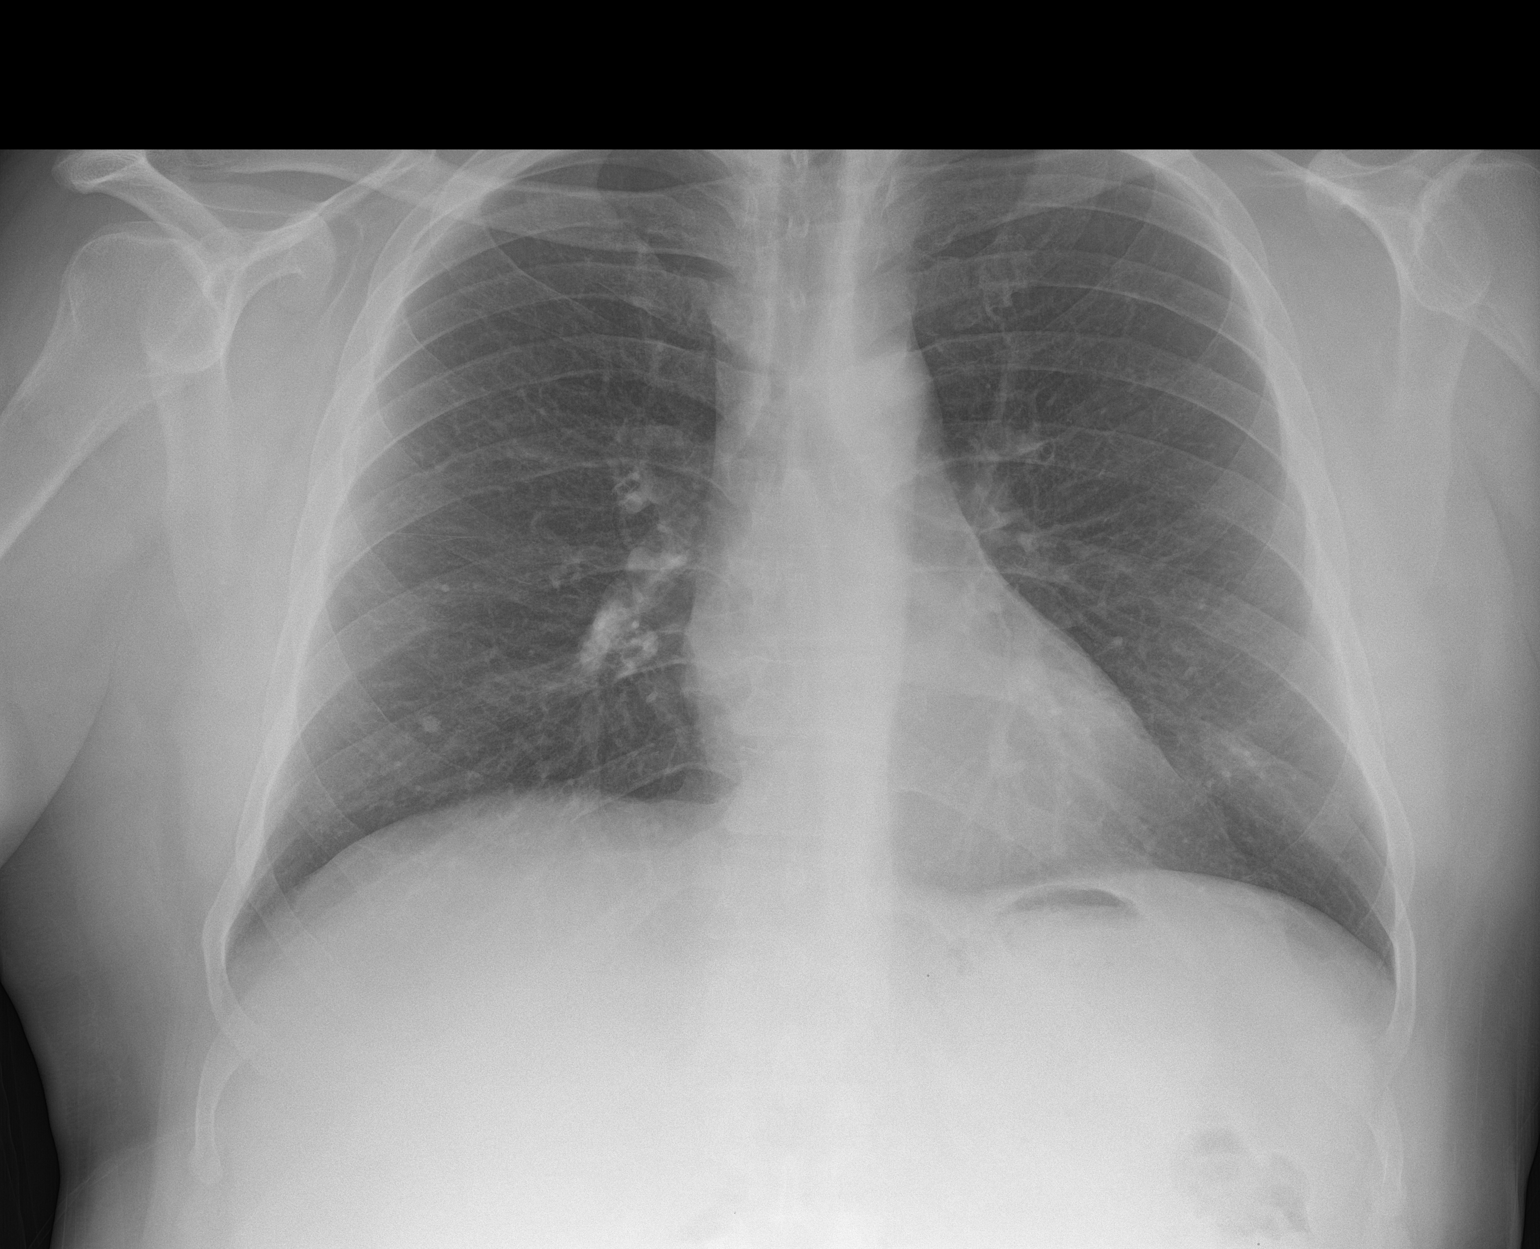

[chest lat]
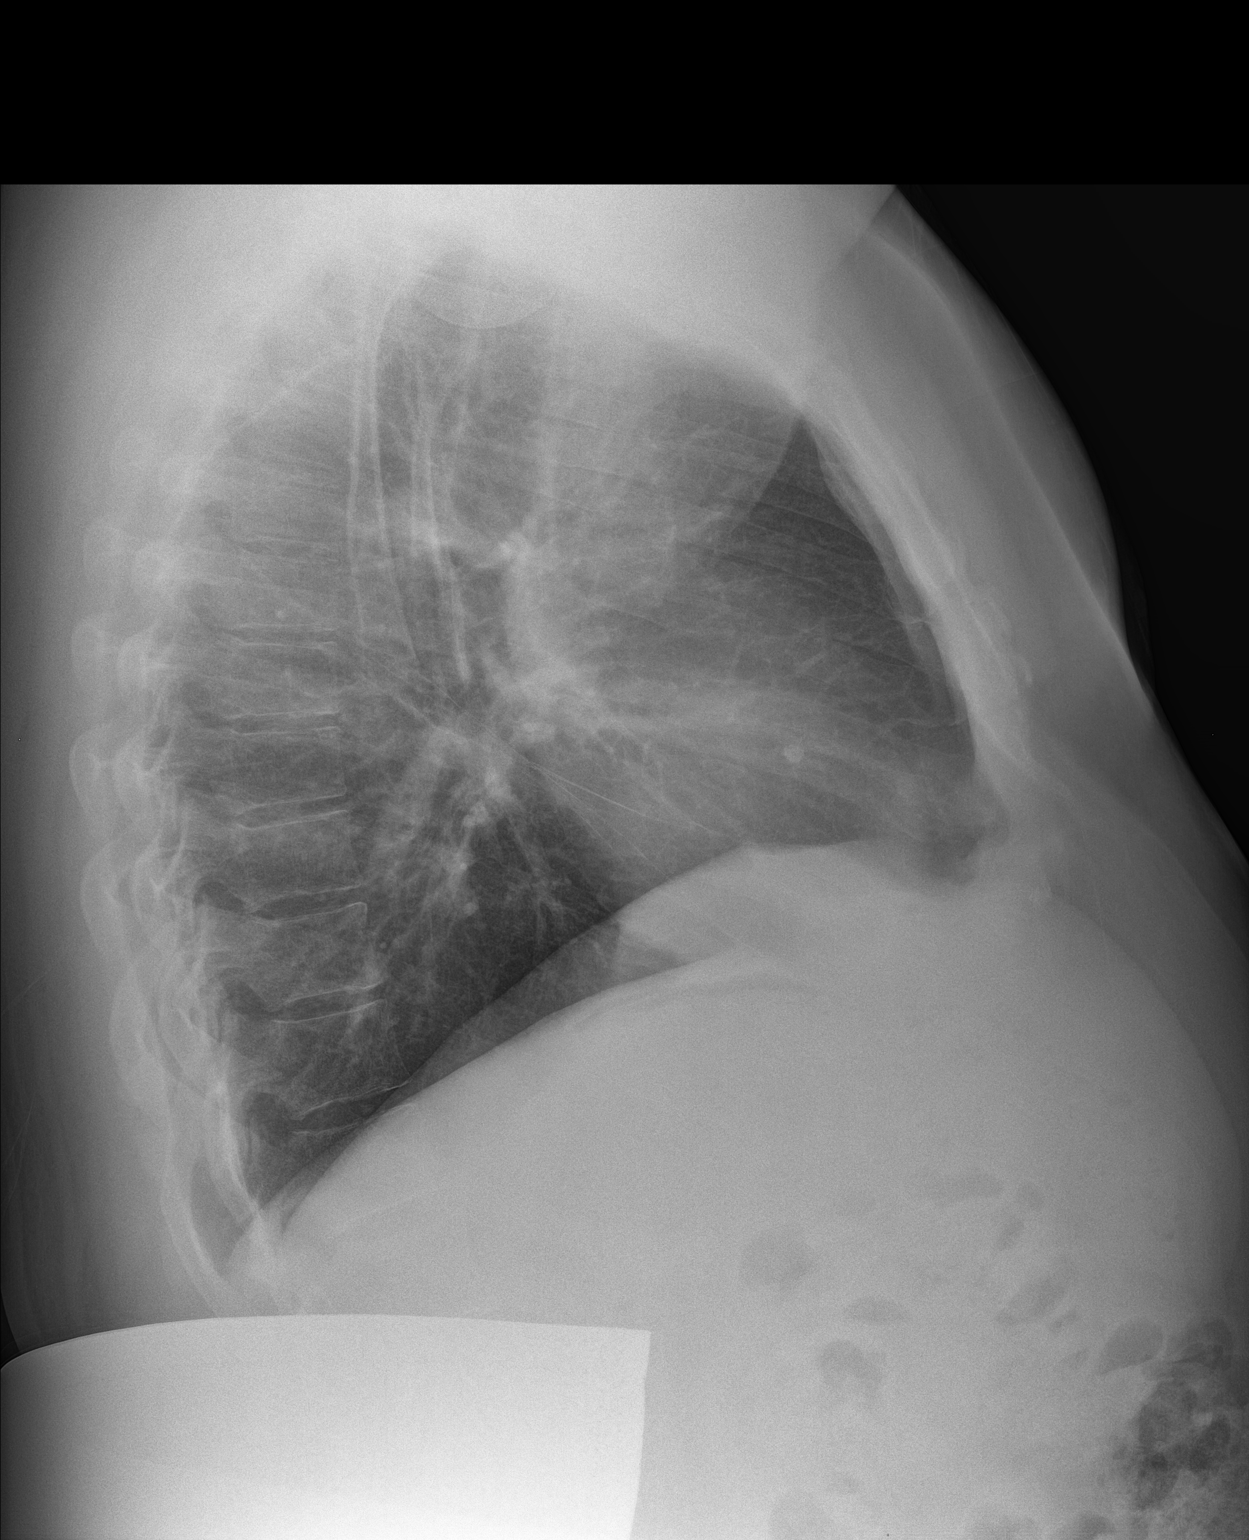

[2 of 2 positions shown; findings below may reference images not displayed]

FINDINGS: Cardiomediastinal silhouette is stable. No acute infiltrate or
pleural effusion. No pulmonary edema. Calcified granuloma in right
lower lobe is stable.
IMPRESSION: No active cardiopulmonary disease.

## 2016-03-02 NOTE — Discharge Instructions (Signed)
Dolor de espalda en adultos °(Back Pain, Adult) °El dolor de espalda es muy frecuente en los adultos. La causa del dolor de espalda es rara vez peligrosa y el dolor a menudo mejora con el tiempo. Es posible que se desconozca la causa de esta afección. Algunas causas comunes son las siguientes: °· Distensión de los músculos o ligamentos que sostienen la columna vertebral. °· Desgaste (degeneración) de los discos vertebrales. °· Artritis. °· Lesiones directas en la espalda. °En muchas personas, el dolor de espalda es recurrente. Como rara vez es peligroso, las personas pueden aprender a manejar esta afección por sí mismas. °INSTRUCCIONES PARA EL CUIDADO EN EL HOGAR °Controle su dolor de espalda a fin de detectar algún cambio. Las siguientes indicaciones ayudarán a aliviar cualquier molestia que pueda sentir: °· Permanezca activo. Si permanece sentado o de pie en un mismo lugar durante mucho tiempo, se tensiona la espalda. No se siente, conduzca o permanezca de pie en un mismo lugar durante más de 30 minutos seguidos. Realice caminatas cortas en superficies planas tan pronto como le sea posible. Trate de caminar un poco más de tiempo cada día. °· Haga ejercicio regularmente como se lo haya indicado el médico. El ejercicio ayuda a que su espalda se cure más rápidamente. También ayuda a prevenir futuras lesiones al mantener los músculos fuertes y flexibles. °· No permanezca en la cama. Si hace reposo más de 1 a 2 días, puede demorar su recuperación. °· Preste atención a su cuerpo al inclinarse y levantarse. Las posiciones más cómodas son las que ejercen menos tensión en la espalda en recuperación. Siempre use técnicas apropiadas para levantar objetos, como por ejemplo: °· Flexionar las rodillas. °· Mantener la carga cerca del cuerpo. °· No torcerse. °· Encuentre una posición cómoda para dormir. Use un colchón firme y recuéstese de costado con las rodillas ligeramente flexionadas. Si se recuesta sobre la espalda, coloque  una almohada debajo de las rodillas. °· Evite sentir ansiedad o estrés. El estrés aumenta la tensión muscular y puede empeorar el dolor de espalda. Es importante reconocer si se siente ansioso o estresado y aprender maneras de controlarlo, por ejemplo haciendo ejercicio. °· Tome los medicamentos solamente como se lo haya indicado el médico. Los medicamentos de venta libre para aliviar el dolor y la inflamación a menudo son los más eficaces. El médico puede recetarle relajantes musculares. Estos medicamentos ayudan a calmar el dolor de modo que pueda reanudar más rápidamente sus actividades normales y el ejercicio saludable. °· Aplique hielo sobre la zona lesionada. °· Ponga el hielo en una bolsa plástica. °· Coloque una toalla entre la piel y la bolsa de hielo. °· Deje el hielo durante 20 minutos, 2 a 3 veces por día, durante los primeros 2 o 3 días. Después de eso, puede alternar el hielo y el calor para reducir el dolor y los espasmos. °· Mantenga un peso saludable. El exceso de peso ejerce presión adicional sobre la espalda y hace que resulte difícil mantener una buena postura. °SOLICITE ATENCIÓN MÉDICA SI: °· Siente un dolor que no se alivia con reposo o medicamentos. °· Siente mucho dolor que se extiende a las piernas o los glúteos. °· El dolor no mejora en una semana. °· Siente dolor por la noche. °· Pierde peso. °· Siente escalofríos o fiebre. °SOLICITE ATENCIÓN MÉDICA DE INMEDIATO SI:  °· Tiene nuevos problemas para controlar la vejiga o los intestinos. °· Siente debilidad o adormecimiento inusuales en los brazos o en las piernas. °· Siente náuseas o vómitos. °· Siente dolor abdominal. °· Siente que va a desmayarse. °  °  Esta informacin no tiene Marine scientist el consejo del mdico. Asegrese de hacerle al mdico cualquier pregunta que tenga.   Document Released: 11/24/2005 Document Revised: 12/15/2014 Elsevier Interactive Patient Education 2016 [place] de garganta  (Sore  Throat) Your sore throat is partially due to the amount of drainage in her throat. Recommend taken an antihistamine to limit the drainage. He may use Allegra, Claritin or Zyrtec. If need something stronger you may use Chlor-Trimeton 2 or 4 mg every 4 hours. This may cause some drowsiness. Cepacol lozenges for sore throat pain Ibuprofen 6 mg every 6 hours as needed. This will also help your back pain.  El dolor de garganta es Conservation officer, historic buildings, ardor, irritacin o sensacin de Dietitian. Generalmente hay dolor o molestias al tragar o hablar. Un dolor de garganta puede estar acompaado de otros sntomas, como tos, estornudos, fiebre y ganglios hinchados en el cuello. Generalmente es Software engineer signo de otra enfermedad, como un resfrio, gripe, anginas o mononucleosis (conocida como mono). La mayor parte de los dolores de garganta desaparecen sin tratamiento mdico. CAUSAS  Las causas ms comunes de dolor de garganta son:   Infecciones virales, como un resfrio, gripe o mononucleosis.  Infeccin bacteriana, como faringitis estreptoccica, amigdalitis, o tos ferina.  Alergias estacionales.  La sequedad en el aire.  Algunos irritantes, como el humo o la polucin.  Reflujo gastroesofgico. INSTRUCCIONES PARA EL CUIDADO EN EL HOGAR   Tome slo la medicacin que le indic el mdico.  Debe ingerir gran cantidad de lquido para mantener la orina de tono claro o color amarillo plido.  Descanse todo lo que sea necesario.  Trate de usar [employer] para la garganta, pastillas o chupe caramelos duros para Best boy (si es mayor de 4 aos o segn lo que le indiquen).  Beba lquidos calientes, como caldos, infusiones de hierbas o agua caliente con miel para calmar el dolor momentneamente. Tambin puede comer o beber lquidos fros o congelados tales como paletas de hielo congelado.  Haga grgaras con agua con sal (mezclar 1 cucharadita de sal en 8 onzas [250 cm3] de agua).  No fume, y evite  el humo de otros fumadores.  Ponga un humidificador de vapor fro en la habitacin por la noche para humedecer el aire. Tambin se puede activar en una ducha de agua caliente y sentarse en el bao con la puerta cerrada durante 5-10 minutos. SOLICITE ATENCIN MDICA DE INMEDIATO SI:   Tiene dificultad para respirar.  No puede tragar lquidos, alimentos blandos, o su saliva.  Usted tiene ms inflamacin en la garganta.  El dolor de garganta no mejora en 7 das.  Tiene nuseas o vmitos.  Tiene fiebre o sntomas que persisten durante ms de 2 o 3 das.  Tiene fiebre y los sntomas empeoran de manera sbita. ASEGRESE DE QUE:   Comprende estas instrucciones.  Controlar su enfermedad.  Solicitar ayuda de inmediato si no mejora o si empeora.   Esta informacin no tiene Marine scientist el consejo del mdico. Asegrese de hacerle al mdico cualquier pregunta que tenga.   Document Released: 11/24/2005 Document Revised: 11/10/2012 Elsevier Interactive Patient Education [employer].

## 2016-03-02 NOTE — ED Notes (Signed)
C/o ST and dyspnea on exertion onset 10 days... Reports he was seen at [facility] ER on 3/20 Was given prednisone, tessalon pearles and naproxen w/no relief.  A&O x4.... No acute distress.

## 2016-03-02 NOTE — ED Provider Notes (Signed)
CSN: [id number]     Arrival date & time 03/02/16  1302 History   First MD Initiated Contact with Patient 03/02/16 1321     Chief Complaint  Patient presents with  . URI   (Consider location/radiation/quality/duration/timing/severity/associated sxs/prior Treatment) HPI Comments: 55 year old spent male presents to the urgent care with complaints of bilateral upper back pain that is worse with lifting bending and breathing. Is also complaining of a sore throat. Denies fever, shortness of breath, call for PND. He is anything emergent department approximately one week ago and was diagnosed with bronchitis. His chest x-ray, strep test, EKG was negative. His vital signs were normal. History with albuterol HFA, prednisone and Tessalon. He states that his cough is better and he is no longer having shortness of breath. He is concerned that the pain in his parathoracic back is due to his bad lungs.   Past Medical History  Diagnosis Date  . Hypertension 11/27/2011  . Diabetes mellitus   . Hypercholesterolemia   . Lymphoma [facility])     gets annual chemo last tx Feb 2013  . Lymphoma ([place])    History reviewed. No pertinent past surgical history. No family history on file. Social History  Substance Use Topics  . Smoking status: Former Smoker    Quit date: 12/08/2006  . Smokeless tobacco: Former Systems developer    Quit date: 11/20/2006  . Alcohol Use: No    Review of Systems  Constitutional: Negative for fever, activity change and fatigue.  HENT: Positive for sore throat. Negative for congestion, postnasal drip, rhinorrhea and trouble swallowing.   Eyes: Negative.   Respiratory: Negative for cough and shortness of breath.   Cardiovascular: Negative for chest pain.  Gastrointestinal: Negative.   Musculoskeletal: Positive for back pain.  Skin: Negative.   Neurological: Negative.     Allergies  Iohexol  Home Medications   Prior to Admission medications   Medication Sig Start Date End Date Taking?  Authorizing Provider  benzonatate (TESSALON) 100 MG capsule Take 1 capsule (100 mg total) by mouth every 8 (eight) hours. 02/26/16  Yes Antonietta Breach, [name]  glipiZIDE (GLUCOTROL) 10 MG tablet Take 1 tablet (10 mg total) by mouth 2 (two) times daily before a meal. 09/07/15  Yes Alyssa A Haney, MD  metFORMIN (GLUCOPHAGE) 1000 MG tablet Take 1 tablet (1,000 mg total) by mouth 2 (two) times daily with a meal. 04/27/15  Yes Renee A Kuneff, DO  naproxen (NAPROSYN) 500 MG tablet Take 1 tablet (500 mg total) by mouth 2 (two) times daily. 02/26/16  Yes Antonietta Breach, [name]  predniSONE (DELTASONE) 20 MG tablet Take 2 tablets (40 mg total) by mouth daily. 02/26/16  Yes Kelly Humes, [name]  glipiZIDE (GLUCOTROL) 10 MG tablet TAKE ONE TABLET BY MOUTH TWICE DAILY BEFORE  A  MEAL 12/30/15   Alyssa A Haney, MD  hydrochlorothiazide (HYDRODIURIL) 25 MG tablet Take 1 tablet (25 mg total) by mouth daily. 11/05/15   Tatyana Kirichenko, [name]  metFORMIN (GLUCOPHAGE) 1000 MG tablet TAKE ONE TABLET BY MOUTH TWICE DAILY WITH A MEAL 01/08/16   Alyssa A Haney, MD  tadalafil (CIALIS) 20 MG tablet TAKE ONE TABLET BY MOUTH EVERY DAY AS NEEDED FOR  ERECTILE  DYSFUNCTION Patient taking differently: Take 10 mg by mouth daily as needed for erectile dysfunction.  07/12/15   Veatrice Bourbon, MD   Meds Ordered and Administered this Visit  Medications - No data to display  There were no vitals taken for this visit. No data found.  Physical Exam  Constitutional: He is oriented to person, place, and time. He appears well-developed and well-nourished. No distress.  HENT:  Bilateral TMs are normal. Oropharynx with minor erythema, cobblestoning and clear PND.  Eyes: Conjunctivae and EOM are normal.  Neck: Normal range of motion. Neck supple.  Cardiovascular: Normal rate, regular rhythm and normal heart sounds.   Pulmonary/Chest: Effort normal and breath sounds normal. No respiratory distress. He has no wheezes.  Minor tenderness with deep  palpation to the para thoracic musculature. Not reproducible with each evaluation. Lungs are clear. No adventitious sounds.  Musculoskeletal: Normal range of motion. He exhibits no edema.  Lymphadenopathy:    He has no cervical adenopathy.  Neurological: He is alert and oriented to person, place, and time.  Skin: Skin is warm and dry. No rash noted.  Psychiatric: He has a normal mood and affect.  Nursing note and vitals reviewed.   ED Course  Procedures (including critical care time)  Labs Review Labs Reviewed - No data to display  Imaging Review Dg Chest 2 View  03/02/2016  CLINICAL DATA:  Cough, congestion for 10 days EXAM: CHEST  2 VIEW COMPARISON:  02/26/2016 FINDINGS: Cardiomediastinal silhouette is stable. No acute infiltrate or pleural effusion. No pulmonary edema. Calcified granuloma in right lower lobe is stable. IMPRESSION: No active cardiopulmonary disease. Electronically Signed   By: Lahoma Crocker M.D.   On: 03/02/2016 14:20     Visual Acuity Review  Right Eye Distance:   Left Eye Distance:   Bilateral Distance:    Right Eye Near:   Left Eye Near:    Bilateral Near:         MDM   1. Sinus drainage   2. Sore throat   3. Bilateral thoracic back pain   4. Muscle strain    Your sore throat is partially due to the amount of drainage in her throat. Recommend taken an antihistamine to limit the drainage. He may use Allegra, Claritin or Zyrtec. If need something stronger you may use Chlor-Trimeton 2 or 4 mg every 4 hours. This may cause some drowsiness. Cepacol lozenges for sore throat pain Ibuprofen 6 mg every 6 hours as needed. This will also help your back pain.     Janne Napoleon, NP 03/02/16 1448  Janne Napoleon, NP 03/02/16 2140

## 2016-03-06 ENCOUNTER — Ambulatory Visit (INDEPENDENT_AMBULATORY_CARE_PROVIDER_SITE_OTHER): Payer: Self-pay | Admitting: Family Medicine

## 2016-03-06 VITALS — BP 129/72 | HR 87 | Temp 98.2°F | Resp 22 | Wt 199.5 lb

## 2016-03-06 DIAGNOSIS — J029 Acute pharyngitis, unspecified: Secondary | ICD-10-CM

## 2016-03-06 DIAGNOSIS — R05 Cough: Secondary | ICD-10-CM

## 2016-03-06 DIAGNOSIS — R059 Cough, unspecified: Secondary | ICD-10-CM

## 2016-03-06 NOTE — Progress Notes (Signed)
Subjective:     Patient ID: Blake Vaughn, male   DOB: 11-09-1961, 55 y.o.   MRN: [id number]  HPI Cough: Patient complains of cough and throat pain that started 8-9 days ago. Went to the ED 4 days ago for his coughing and pain. CXR, strep test, EKG were negative. Continues to feel pain which is worse at night and cold air. Has bilateral shoulder pain that started with cough. Sore throat comes and goes, says it may last several hours and he is not sure what may bring it on; he does notice the pain when he wakes up in the morning. He says nothing improves his throat pain. Denies hemoptysis, fever, nausea, vomiting, diarrhea, or heartburn.  Social: Recently returned from [place], [place] on the 21st of March. He was there for a week starting on the 14th. Says that he started getting sick around the time he was leaving. No new sexual partners, has had intercourse with wife recently.   Review of Systems Negative other than noted in HPI    Objective:   Physical Exam Filed Vitals:   03/06/16 1449  BP: 129/72  Pulse: 87  Temp: 98.2 F (36.8 C)  Resp: 22   Gen: Appears healthy, non-distressed. Has minor cough.  ENTM: moist mucous membranes. There are no oropharyngeal lesions, minimal posterior pharyngeal erythema with no exudates. Uvula midline.  Pulm: Clear to auscultation bilaterally, no wheezes or crackles. No increased work of breathing.  CV: RRR, no murmurs, rubs, or gallops.     Assessment:     Blake Vaughn is a 55 y.o. Male with a history significant for HTN and DM who presents with a cough concerning for a viral URI given normal CXR/EKG, neg strep test, and absence of fever. Bacterial infection is unlikely due to lack of fever, throat pain that comes and goes, and normal CXR (other than stable RLL granuloma) and neg strep test/culture. Suspect his persistent throat pain may be just cough; very low suspicion for other bacterial infection (GC/C).    Plan:     Cough/throat pain:  Recommended symptomatic treatment for cough and throat pain. Told patient to take ibuprofen or tylenol to relieve pain and to expect cough to last several weeks.  If symptoms worsen, patient was advised to return to clinic or go to the ED.        I have seen and examined the patient. I have edited the above note to reflect my history, physical, assessment and plan.   Tawanna Sat, MD 03/07/2016, 5:46 PM PGY-3, [place]

## 2016-03-06 NOTE — Patient Instructions (Signed)
Ibuprofen (motrin) - 600 to 800mg  every 8 hours as needed  Tylenol (acetaminophen) - 500 to 650mg  every 6 hours as needed   Throat lozenges   Return if not getting better in 7-10 days

## 2016-04-02 ENCOUNTER — Ambulatory Visit (INDEPENDENT_AMBULATORY_CARE_PROVIDER_SITE_OTHER): Payer: Self-pay | Admitting: Family Medicine

## 2016-04-02 ENCOUNTER — Encounter: Payer: Self-pay | Admitting: Family Medicine

## 2016-04-02 VITALS — BP 138/80 | HR 83

## 2016-04-02 DIAGNOSIS — G4452 New daily persistent headache (NDPH): Secondary | ICD-10-CM

## 2016-04-02 DIAGNOSIS — E1100 Type 2 diabetes mellitus with hyperosmolarity without nonketotic hyperglycemic-hyperosmolar coma (NKHHC): Secondary | ICD-10-CM

## 2016-04-02 DIAGNOSIS — I10 Essential (primary) hypertension: Secondary | ICD-10-CM

## 2016-04-02 DIAGNOSIS — E7800 Pure hypercholesterolemia, unspecified: Secondary | ICD-10-CM

## 2016-04-02 LAB — COMPLETE METABOLIC PANEL WITH GFR
ALBUMIN: 4 g/dL (ref 3.6–5.1)
ALT: 25 U/L (ref 9–46)
AST: 17 U/L (ref 10–35)
Alkaline Phosphatase: 68 U/L (ref 40–115)
BILIRUBIN TOTAL: 0.4 mg/dL (ref 0.2–1.2)
BUN: 16 mg/dL (ref 7–25)
CALCIUM: 9.7 mg/dL (ref 8.6–10.3)
CHLORIDE: 105 mmol/L (ref 98–110)
CO2: 24 mmol/L (ref 20–31)
CREATININE: 0.8 mg/dL (ref 0.70–1.33)
GFR, Est Non African American: >89 mL/min (ref >=60)
Glucose, Bld: 118 mg/dL — ABNORMAL HIGH (ref 65–99)
Potassium: 4.4 mmol/L (ref 3.5–5.3)
Sodium: 138 mmol/L (ref 135–146)
TOTAL PROTEIN: 6.7 g/dL (ref 6.1–8.1)

## 2016-04-02 LAB — LIPID PANEL
CHOLESTEROL: 241 mg/dL — AB (ref 125–200)
HDL: 32 mg/dL — ABNORMAL LOW (ref >=40)
TRIGLYCERIDES: 486 mg/dL — AB (ref <150)
Total CHOL/HDL Ratio: 7.5 Ratio — ABNORMAL HIGH (ref <=5.0)

## 2016-04-02 LAB — POCT GLYCOSYLATED HEMOGLOBIN (HGB A1C): HEMOGLOBIN A1C: 8.7

## 2016-04-02 MED ORDER — TRAMADOL HCL 50 MG PO TABS
50.0000 mg | ORAL_TABLET | Freq: Three times a day (TID) | ORAL | Status: DC | PRN
Start: 2016-04-02 — End: 2018-10-28

## 2016-04-02 NOTE — Assessment & Plan Note (Addendum)
Worried about very poor control, espec with recent prednisone. Perhaps metabolic disturbance or mild dehydration from hyperglycemia causing headache.

## 2016-04-02 NOTE — Progress Notes (Signed)
   Subjective:    Patient ID: Blake Vaughn, male    DOB: 29-Jul-1961, 55 y.o.   MRN: [id number]  HPI patient with occipital headache.  Has not been in [facility] for DM check in about 1 year.  Seen two weeks ago with bronchitis.  Started on prednisone and antibiotic.  Cough and general feeling ill has cleared up.  Now with occipital headache.  No photophobia or phonophobia or nausea.  No weakness or numbness.  He was concern about possible stroke.  Does have history of lymphoma.  I note last A1C was 12 and had recent course of prednisone.  On med review, he tells me he is taking only 2 medications, each once a day, for diabetes.  He apparently is no longer taking any HBP meds.    Review of Systems     Objective:   Physical Exam  VS including normal BP noted. Lungs clear Cardiac RRR without m or g Neuro, CN2-12 intact, motor and sensory grossly intact, DTRs 2+ and symetric.    It is a bit reassuring that his A1C is only 8.7 today.  I expected much higher.        Assessment & Plan:

## 2016-04-02 NOTE — Patient Instructions (Addendum)
Please see your regular doctor.  Bring in all your medications.

## 2016-04-02 NOTE — Assessment & Plan Note (Signed)
Check labs 

## 2016-04-02 NOTE — Assessment & Plan Note (Signed)
Unclear etiology.  Focus on metabolic causes now.  I should have ordered a CBC with his lymphoma. Instructed FU one week and bring all meds He may need CNS imaging with his history of cancer should his symptoms persist.

## 2016-04-02 NOTE — Assessment & Plan Note (Signed)
Apparently off meds and control today OK.

## 2016-04-10 ENCOUNTER — Ambulatory Visit (INDEPENDENT_AMBULATORY_CARE_PROVIDER_SITE_OTHER): Payer: Self-pay | Admitting: Family Medicine

## 2016-04-10 ENCOUNTER — Ambulatory Visit (HOSPITAL_COMMUNITY)
Admission: RE | Admit: 2016-04-10 | Discharge: 2016-04-10 | Disposition: A | Discharge status: Home / Self Care | Payer: Self-pay | Source: Ambulatory Visit | Attending: Family Medicine | Admitting: Family Medicine

## 2016-04-10 ENCOUNTER — Encounter: Payer: Self-pay | Admitting: Family Medicine

## 2016-04-10 VITALS — BP 135/67 | HR 95 | Temp 98.0°F | Ht 65.0 in | Wt 201.0 lb

## 2016-04-10 DIAGNOSIS — I739 Peripheral vascular disease, unspecified: Secondary | ICD-10-CM | POA: Insufficient documentation

## 2016-04-10 DIAGNOSIS — E11628 Type 2 diabetes mellitus with other skin complications: Secondary | ICD-10-CM

## 2016-04-10 DIAGNOSIS — E1169 Type 2 diabetes mellitus with other specified complication: Secondary | ICD-10-CM | POA: Insufficient documentation

## 2016-04-10 DIAGNOSIS — M7989 Other specified soft tissue disorders: Secondary | ICD-10-CM | POA: Insufficient documentation

## 2016-04-10 DIAGNOSIS — L089 Local infection of the skin and subcutaneous tissue, unspecified: Secondary | ICD-10-CM

## 2016-04-10 IMAGING — DX DG FOOT COMPLETE 3+V*L*
3 series · 3 of 3 positions shown · non-contrast
Comparison: None.

CLINICAL DATA: Diabetes.  Open wound.

EXAM:
LEFT FOOT - COMPLETE 3+ VIEW

[x foot ap left]
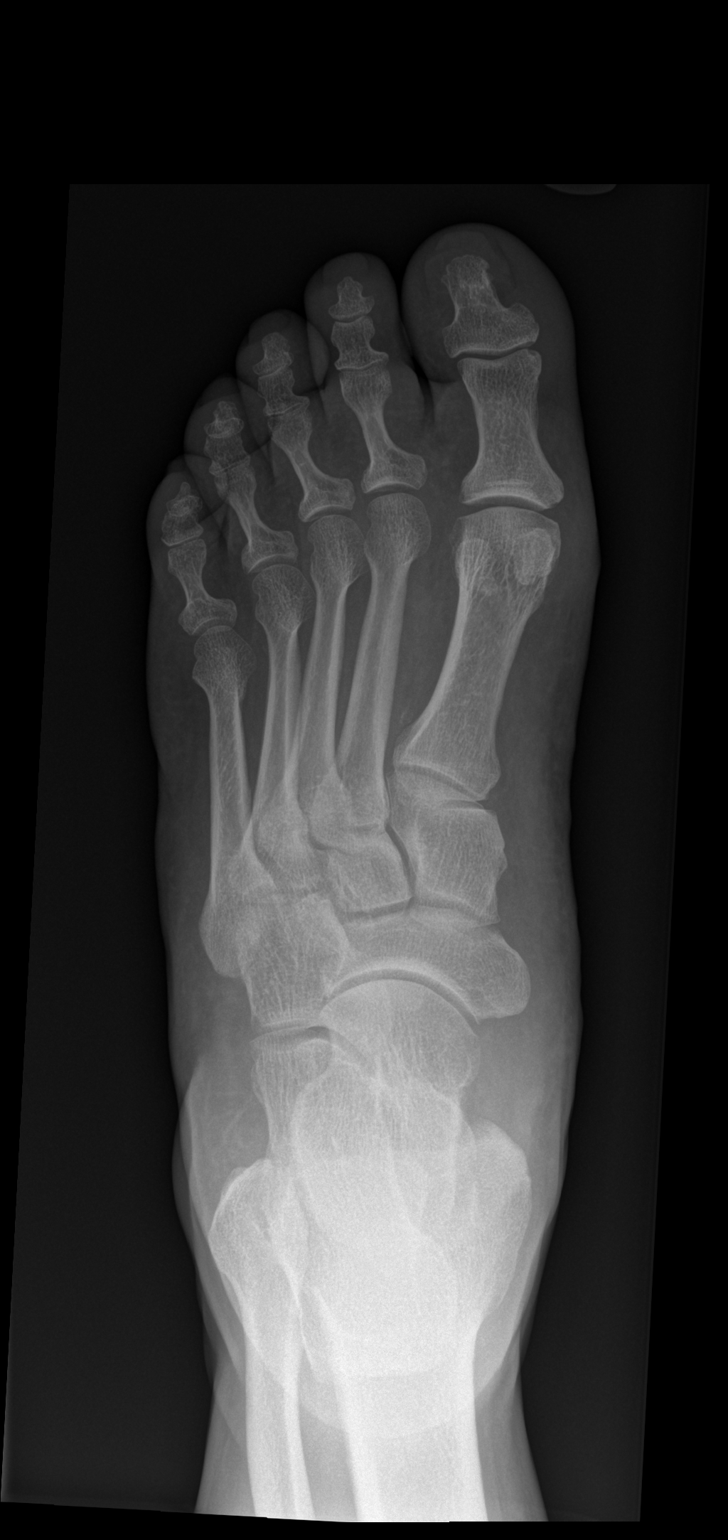

[x foot obl left]
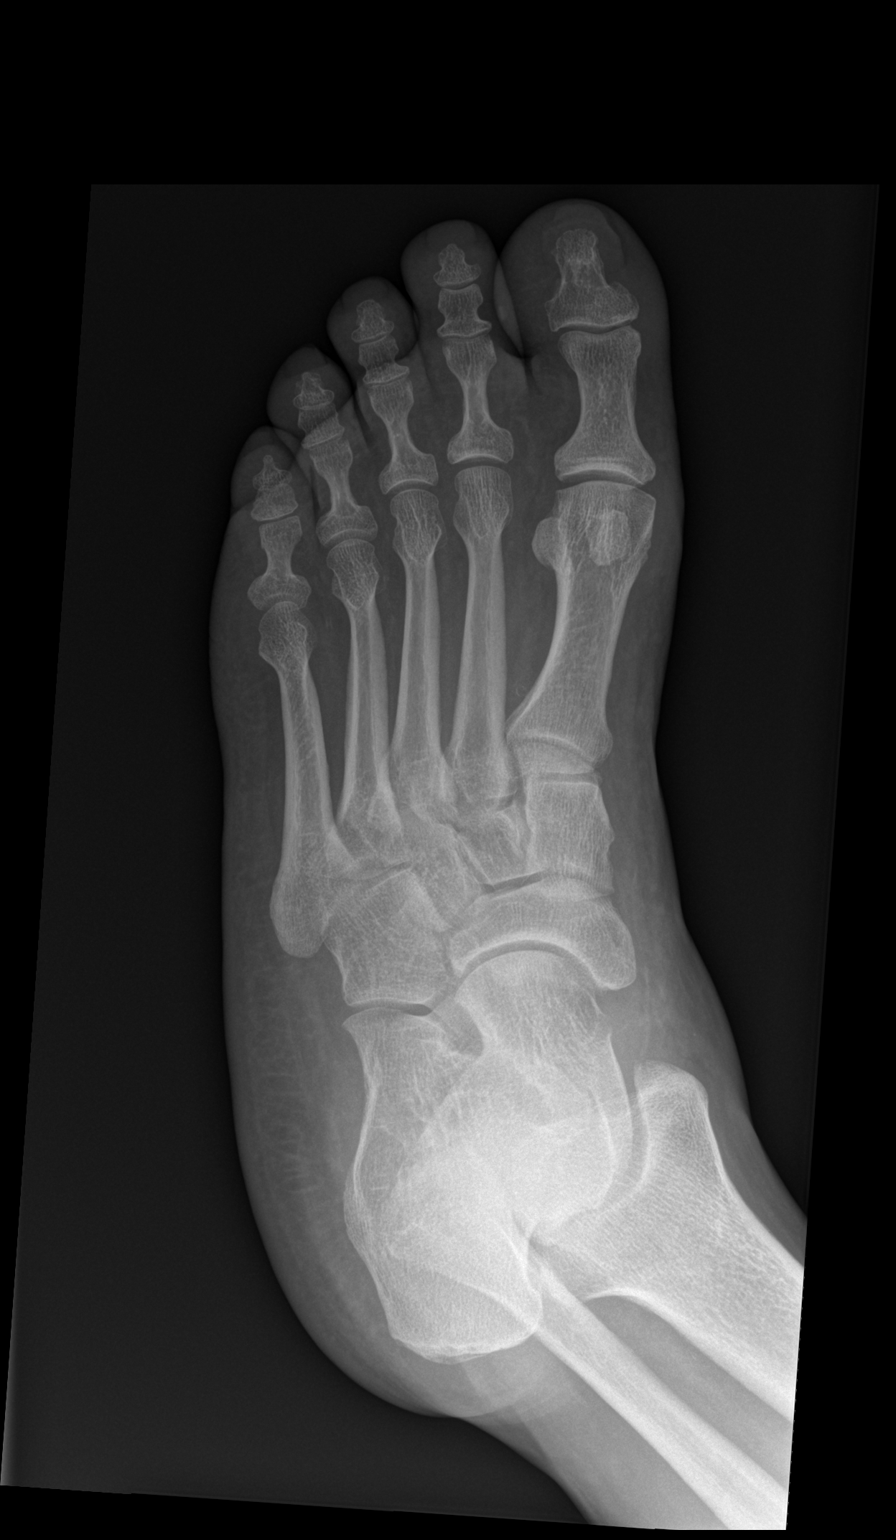

[x foot lat left]
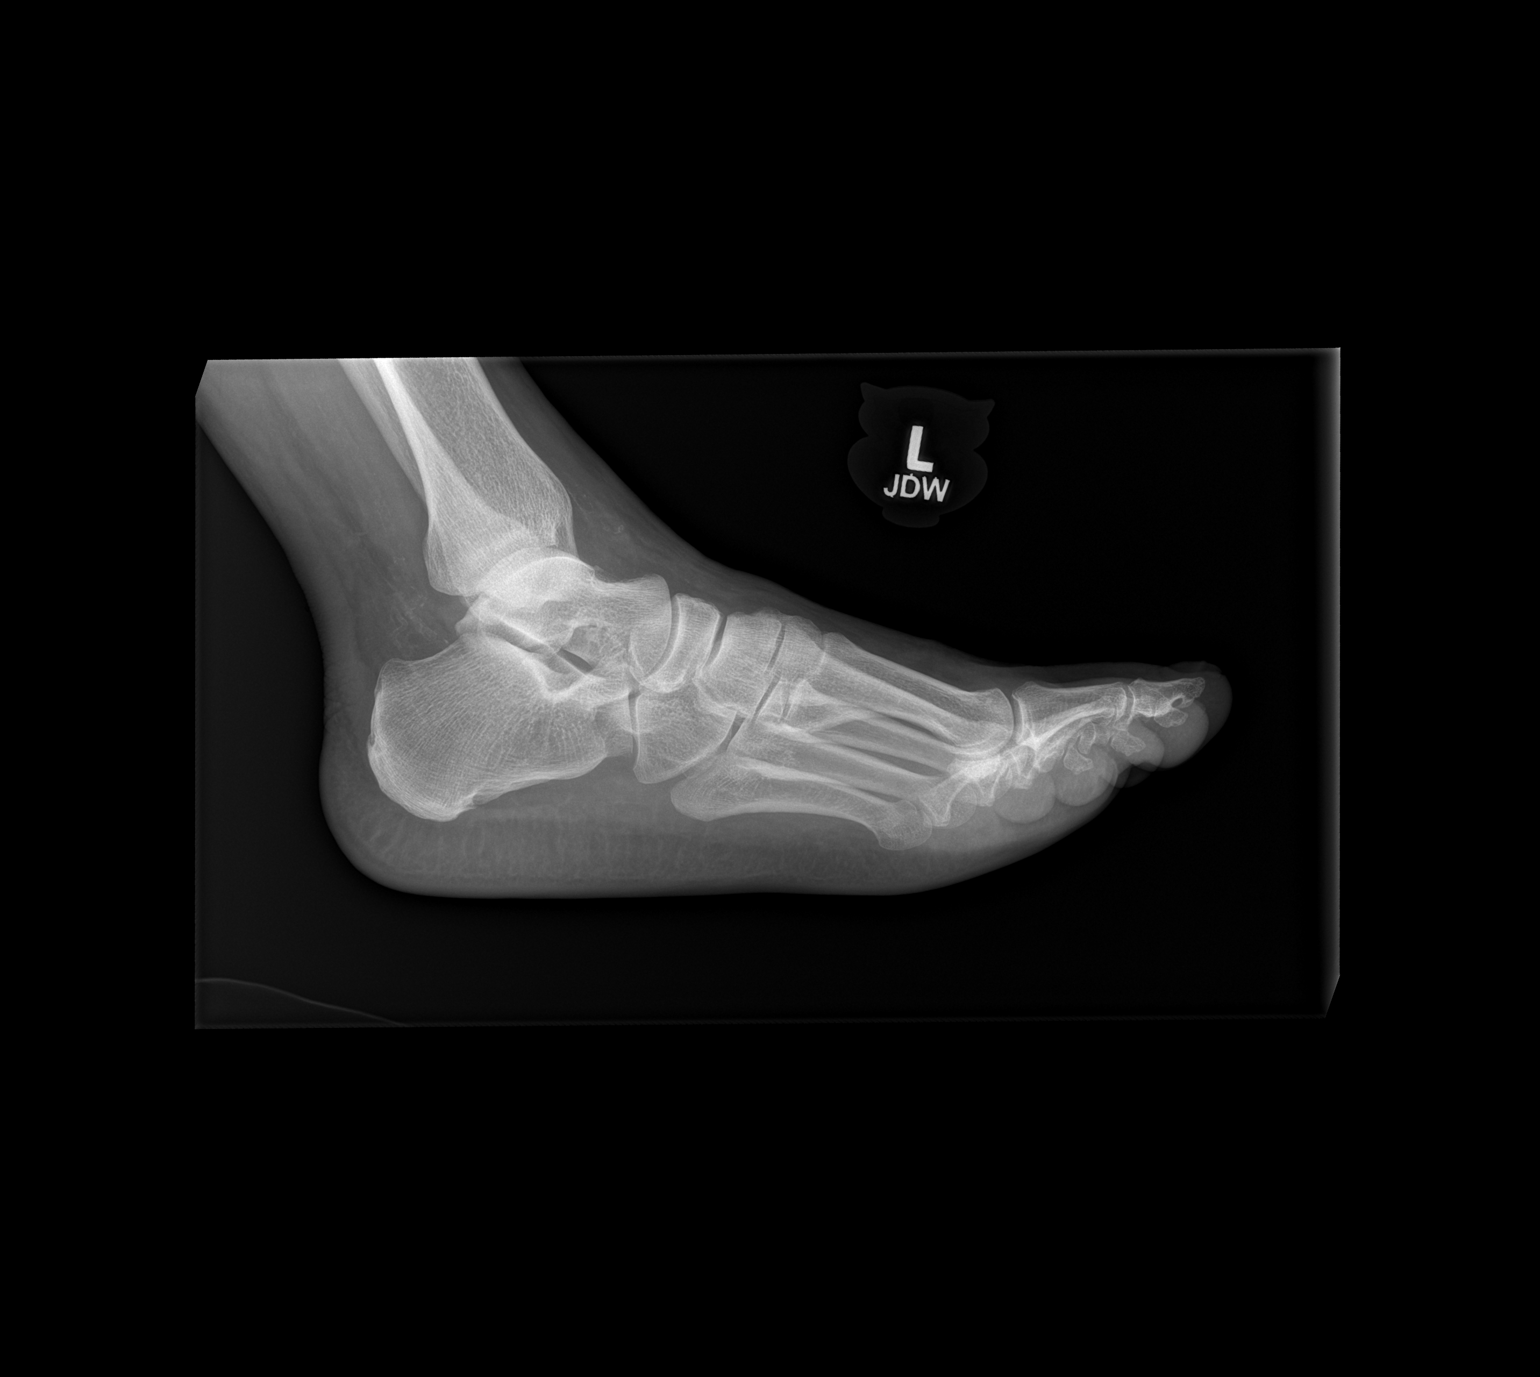

[3 of 3 positions shown; findings below may reference images not displayed]

FINDINGS: No acute bony or joint abnormality identified. No evidence fracture
or dislocation. No focal erosive abnormality . Mild diffuse
degenerative change. All soft-tissue swelling left second digit. No
radiopaque foreign bodies. Peripheral vascular calcification .
IMPRESSION: 1. Mild diffuse degenerative change.  No acute or focal abnormality.

2. Peripheral vascular disease. Soft tissue swelling left second
digit. No radiopaque soft tissue foreign bodies.

## 2016-04-10 MED ORDER — CLINDAMYCIN HCL 300 MG PO CAPS: 300.0000 mg | ORAL_CAPSULE | Freq: Three times a day (TID) | ORAL | Status: DC

## 2016-04-10 MED ORDER — CIPROFLOXACIN HCL 500 MG PO TABS: 500.0000 mg | ORAL_TABLET | Freq: Two times a day (BID) | ORAL | Status: DC

## 2016-04-10 NOTE — Patient Instructions (Addendum)
Thanks for letting us take care of you.   We need to get x-rays of your foot. Go to [facility], and ask for them to bring you to the Radiology Department to get X-rays of your foot.   I have prescribed an antibiotic for you.   You will take the Ciprofloxacin twice a day (morning and night)  You will take the Clindamycin three times a day (morning, noon, and night)  Follow up in this clinic on Monday 5/8. Make an appointment on your way out.   Thanks for letting us take care of you.   Sincerely, Paula Compton, MD Family Medicine -PGY 2

## 2016-04-10 NOTE — Progress Notes (Signed)
Patient ID: Blake Vaughn, male   DOB: 1961-07-10, 55 y.o.   MRN: [id number]   [facility] Aquilla Hacker, MD Phone: [phone]  Subjective:   # Foot Wound - Left Foot - here with foot wound with bleeding / discharge for the past day.  - Has uncontrolled DMII. Hasn't been checking his glucose. 8.7 A1C on 4/26.  - Does not remember any injury.  - Feels that it is infected and that it is a "deep infection".  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  - Having pain across the top and bottom of his foot.  - Painful to bend his foot.  - Has never had infection or ulceration of his foot before.  - Says he has no numbness or tingling in the other toes on his left foot.   All relevant systems were reviewed and were negative unless otherwise noted in the HPI  Past Medical History Reviewed problem list.  Medications- reviewed and updated Current Outpatient Prescriptions  Medication Sig Dispense Refill  . glipiZIDE (GLUCOTROL) 10 MG tablet Take 1 tablet (10 mg total) by mouth 2 (two) times daily before a meal. 60 tablet 9  . metFORMIN (GLUCOPHAGE) 1000 MG tablet Take 1 tablet (1,000 mg total) by mouth 2 (two) times daily with a meal. 60 tablet 1  . traMADol (ULTRAM) 50 MG tablet Take 1 tablet (50 mg total) by mouth every 8 (eight) hours as needed for moderate pain. 30 tablet 0   No current facility-administered medications for this visit.   Chief complaint-noted No additions to family history Social history- patient is a non smoker Denies ETOH.   Objective: BP 135/67 mmHg  Pulse 95  Temp(Src) 98 F (36.7 C) (Oral)  Ht 5\' 5"  (1.651 m)  Wt 201 lb (91.173 kg)  BMI 33.45 kg/m2 Gen: NAD, alert, cooperative with exam HEENT: NCAT, EOMI, PERRL Neck: FROM, supple CV: RRR, good S1/S2, no murmur Resp: CTABL, no wheezes, non-labored Abd: SNTND, BS present, no guarding or organomegaly Ext: No edema, warm, normal tone, moves UE/LE spontaneously - RLE  with discharge, swelling, tenderness, and open wound at the crease of the 4th right digit. No obvious bone, no bleeding, no visible foreign body, swelling / redness tracking up the dorsum of his foot was noted.  Neuro: Alert and oriented, No gross deficits Skin: no rashes no lesions  Assessment/Plan:  # Diabetic Foot Wound - ? Whether this is osteo or not. Foot is definitely infected and needs Abx treatment. His pain and the exam are concerning.  - Started on clindamycin and Ciprofloxacin abx given DMII and Foot infection likely polymicrobial.  - No cx given polymicrobial.  - Needs xray for basic eval for osteo - Continue rx for DMII.  - Return precautions reviewed with him and he agreed and understands the severity of this infection if it persists or becomes uncontrolled.

## 2016-04-14 ENCOUNTER — Encounter: Payer: Self-pay | Admitting: Obstetrics and Gynecology

## 2016-04-14 ENCOUNTER — Ambulatory Visit (INDEPENDENT_AMBULATORY_CARE_PROVIDER_SITE_OTHER): Payer: Self-pay | Admitting: Obstetrics and Gynecology

## 2016-04-14 VITALS — BP 124/64 | HR 77 | Temp 98.0°F | Wt 197.0 lb

## 2016-04-14 DIAGNOSIS — E1169 Type 2 diabetes mellitus with other specified complication: Secondary | ICD-10-CM

## 2016-04-14 DIAGNOSIS — E118 Type 2 diabetes mellitus with unspecified complications: Secondary | ICD-10-CM

## 2016-04-14 DIAGNOSIS — IMO0002 Reserved for concepts with insufficient information to code with codable children: Secondary | ICD-10-CM

## 2016-04-14 DIAGNOSIS — E1165 Type 2 diabetes mellitus with hyperglycemia: Secondary | ICD-10-CM

## 2016-04-14 DIAGNOSIS — L089 Local infection of the skin and subcutaneous tissue, unspecified: Secondary | ICD-10-CM

## 2016-04-14 DIAGNOSIS — E11628 Type 2 diabetes mellitus with other skin complications: Secondary | ICD-10-CM

## 2016-04-14 MED ORDER — RELION LANCETS STANDARD 21G MISC: Status: DC

## 2016-04-14 MED ORDER — ALCOHOL SWABS PADS: MEDICATED_PAD | Status: DC

## 2016-04-14 MED ORDER — GLUCOSE BLOOD VI STRP: ORAL_STRIP | Status: DC

## 2016-04-14 MED ORDER — RELION PRIME MONITOR DEVI: Status: DC

## 2016-04-14 NOTE — Assessment & Plan Note (Addendum)
Poorly controlled diabetes. Lst A1c 8.7. Only on oral medications. Patient not monitoring sugars. Rx prescribed for glucometer (self pay patient). Discussed need to follow-up with PCP to coordiante diabetes care better. Would recommend follow-up with nutritionist and diabetic educator for patient.

## 2016-04-14 NOTE — Patient Instructions (Addendum)
Follow-up with PCP in the next 2 weeks to discuss diabetes and coordinate with a diabetes coordinator.  Rx sent to [facility] for diabetes supplies  Herida punzante (Puncture Wound) Una herida punzante es una lesin causada por un objeto fino y filoso que penetra en la piel, por ejemplo, un clavo. Este tipo de heridas no suelen dejar una gran abertura en la piel, de modo que es posible que no sangren [place]. Sin embargo, cuando se produce una herida punzante, la suciedad u otros materiales (cuerpos extraos) pueden Advertising account planner ella y Cabin crew. Esto aumenta la probabilidad de que haya una infeccin, por ejemplo, ttanos. CUIDADOS EN EL HOGAR Medicamentos  Tome o aplique los medicamentos de venta libre y los recetados solamente como se lo haya indicado el mdico.  Si le recetaron un antibitico, tmelo o aplquelo como se lo haya indicado el mdico. No deje de usar el antibitico aunque la afeccin empiece a Teacher, English as a foreign language. Cuidados de la herida  Existen muchas maneras de cerrar y [place] herida. Por ejemplo, una herida se puede cubrir con puntos (suturas), pegamento para la piel o tiras [id number]. Siga las indicaciones del mdico respecto de lo siguiente:  Cmo cuidar de la herida.  Cmo y cundo cambiar las vendas (vendaje).  Cundo retirar el vendaje.  Cmo quitar lo que se haya utilizado para cerrar la herida.  Mantenga el vendaje seco como se lo haya indicado el mdico. No tome baos de inmersin, no nade, no use el jacuzzi ni haga ninguna actividad en la que la herida quede debajo del agua hasta que el mdico lo autorice.  Limpie la herida como se lo haya indicado el mdico.  No se rasque ni se toque la herida.  Controle la herida [employer] para detectar signos de infeccin. Est atento a lo siguiente:  Dolor, hinchazn o enrojecimiento.  Lquido, sangre o pus. Instrucciones generales  Cuando est sentado o acostado, eleve la zona de la lesin por encima del nivel del  corazn.  Si la herida punzante est en el pie, pregntele al mdico si debe evitar soportar peso sobre este y durante cunto [place].  Concurra a todas las visitas de control como se lo haya indicado el mdico. Esto es importante. SOLICITE AYUDA SI:  Recibi una vacuna antitetnica y tiene cualquiera de estos problemas en el sitio de la inyeccin:  Hinchazn.  Dolor intenso.  Enrojecimiento.  Hemorragia.  Tiene fiebre.  Se le salen los puntos.  Percibe que sale mal olor de la herida o de la venda.  Nota un cuerpo extrao en la herida, como un trozo de [place] o vidrio.  Los medicamentos no Forensic psychologist.  Tiene ms enrojecimiento, hinchazn o dolor en el lugar de la herida.  Observa lquido, sangre o pus que salen de la herida.  Observa que la piel cerca de la herida cambia de color.  Debe cambiar la venda con frecuencia debido a que hay secrecin de lquido, sangre o pus de la herida.  Tiene una erupcin cutnea nueva.  Comienza a tener entumecimiento alrededor [employer] herida. SOLICITE AYUDA DE INMEDIATO SI:  Hay mucha hinchazn alrededor de la herida.  El dolor empeora repentinamente y es muy intenso.  Le aparecen bultos dolorosos en la piel.  Tiene una lnea roja que sale de la herida.  La herida est en la mano o en el pie y no puede mover uno los dedos con normalidad.  La herida est en la mano o en el pie y  observa que los dedos tienen un tono plido o [place].   Esta informacin no tiene Marine scientist el consejo del mdico. Asegrese de hacerle al mdico cualquier pregunta que tenga.   Document Released: 07/23/2011 Document Revised: 08/15/2015 Elsevier Interactive Patient Education 2016 [place] diabetes mellitus y los alimentos (Diabetes Mellitus and Food) Es importante que controle su nivel de azcar en la sangre (glucosa). El nivel de glucosa en sangre depende en gran medida de lo que usted come. Comer alimentos saludables en las  cantidades [place] a lo largo del Training and development officer, aproximadamente a la misma hora [employer], lo ayudar a Chief Technology Officer su nivel de Multimedia programmer. Tambin puede ayudarlo a retrasar o Patent attorney de la diabetes mellitus. Comer de [employer] saludable incluso puede ayudarlo a Chartered loss adjuster de presin arterial y a Science writer o Theatre manager un peso saludable.  Entre las recomendaciones generales para alimentarse y Audiological scientist los alimentos de forma saludable, se incluyen las siguientes:  Respetar las comidas principales y comer colaciones con regularidad. Evitar pasar largos perodos sin comer con el fin de perder peso.  Seguir una dieta que consista principalmente en alimentos de origen vegetal, como frutas, vegetales, frutos secos, legumbres y cereales integrales.  Utilizar mtodos de coccin a baja temperatura, como hornear, en lugar de mtodos de coccin a alta temperatura, como frer en abundante aceite. Trabaje con el nutricionista para aprender a Financial planner nutricional de las etiquetas de los alimentos. CMO PUEDEN AFECTARME LOS ALIMENTOS? Carbohidratos Los carbohidratos afectan el nivel de glucosa en sangre ms que cualquier otro tipo de alimento. El nutricionista lo ayudar a Teacher, adult education cuntos carbohidratos puede consumir en cada comida y ensearle a contarlos. El recuento de carbohidratos es importante para mantener la glucosa en sangre en un nivel saludable, en especial si utiliza insulina o toma determinados medicamentos para la diabetes mellitus. Alcohol El alcohol puede provocar disminuciones sbitas de la glucosa en sangre (hipoglucemia), en especial si utiliza insulina o toma determinados medicamentos para la diabetes mellitus. La hipoglucemia es una afeccin que puede poner en peligro la vida. Los sntomas de la hipoglucemia (somnolencia, mareos y Data processing manager) son similares a los sntomas de haber consumido mucho alcohol.  Si el mdico lo autoriza a beber alcohol, hgalo con  moderacin y siga estas pautas:  Las mujeres no deben beber ms de un trago por da, y los hombres no deben beber ms de dos tragos por Training and development officer. Un trago es igual a:  12 onzas (355 ml) de cerveza  5 onzas de vino (150 ml) de vino  1,5onzas (49ml) de bebidas espirituosas  No beba con el estmago vaco.  Mantngase hidratado. Beba agua, gaseosas dietticas o t helado sin azcar.  Las gaseosas comunes, los jugos y otros refrescos podran contener muchos carbohidratos y se Civil Service fast streamer. QU ALIMENTOS NO SE RECOMIENDAN? Cuando haga las elecciones de alimentos, es importante que recuerde que todos los alimentos son distintos. Algunos tienen menos nutrientes que otros por porcin, aunque podran tener la misma cantidad de caloras o carbohidratos. Es difcil darle al cuerpo lo que necesita cuando consume alimentos con menos nutrientes. Estos son algunos ejemplos de alimentos que debera evitar ya que contienen muchas caloras y carbohidratos, pero pocos nutrientes:  Physicist, medical trans (la mayora de los alimentos procesados incluyen grasas trans en la etiqueta de Informacin nutricional).  Gaseosas comunes.  Jugos.  Caramelos.  Dulces, como tortas, pasteles, rosquillas y [place].  Comidas fritas. QU ALIMENTOS PUEDO COMER? Consuma alimentos ricos en nutrientes, que  nutrirn el cuerpo y lo mantendrn saludable. Los alimentos que debe comer tambin dependern de varios factores, como:  Las caloras que necesita.  Los medicamentos que toma.  Su peso.  El nivel de glucosa en [place].  El [place] de presin arterial.  El nivel de colesterol. Debe consumir una amplia variedad de alimentos, por ejemplo:  Protenas.  Cortes de [employer]or Corporation.  Protenas con bajo contenido de grasas saturadas, como pescado, clara de huevo y frijoles. Evite las carnes procesadas.  Frutas y vegetales.  Frutas y Photographer que pueden ayudar a Chief Technology Officer los niveles sanguneos de [place], como [place], mangos  y batatas.  Productos lcteos.  Elija productos lcteos sin grasa o con bajo contenido de [place], como [place], yogur y [place].  Cereales, panes, pastas y arroz.  Elija cereales integrales, como panes multicereales, avena en grano y arroz integral. Estos alimentos pueden ayudar a controlar la presin arterial.  Daphene Jaeger.  Alimentos que contengan grasas saludables, como frutos secos, Musician, aceite de [place], aceite de canola y pescado. TODOS LOS QUE PADECEN DIABETES MELLITUS TIENEN EL [place] PLAN DE [place]? Dado que todas las personas que padecen diabetes mellitus son distintas, no hay un solo plan de comidas que funcione para todos. Es muy importante que se rena con un nutricionista que lo ayudar a crear un plan de comidas adecuado para usted.   Esta informacin no tiene Marine scientist el consejo del mdico. Asegrese de hacerle al mdico cualquier pregunta que tenga.   Document Released: 03/02/2008 Document Revised: 12/15/2014 Elsevier Interactive Patient Education [employer].  Diabetes y cuidados del pie (Diabetes and Foot Care) La diabetes puede ser la causa de que el flujo sanguneo (circulacin) en las piernas y los pies sea deficiente. Debido a esto, la piel de los pies se torna ms delgada, se rompe con facilidad y se cura ms lentamente. La piel puede estar seca, despellejarse y Medical illustrator. Tambin pueden estar daados los nervios de las piernas y de los pies lo que provoca una disminucin de la sensibilidad. Es posible que no advierta heridas ms pequeas en los pies, que pueden causar infecciones graves. Cuidar sus pies es una de las cosas ms importantes que puede hacer por usted mismo.  INSTRUCCIONES PARA EL CUIDADO EN EL HOGAR  Use siempre calzado, an dentro de su casa. No camine descalzo. Caminar descalzo facilita que se lastime.  Controle sus pies diariamente para observar ampollas, cortes y enrojecimiento. Si no puede ver la planta del pie, use un espejo o  pdale ayuda a Nurse, children's.  Lave sus pies con agua tibia (no use agua caliente) y un [place]. Seque bien sus pies, y la zona [employer] dedos dando [place], hasta que estn completamente secos. Noremoje los pies, ya que esto puede resecar la piel.  Aplique una locin hidratante o vaselina (que no contenga alcohol ni perfume) en los pies y en las uas secas y [place]. No aplique locin entre los dedos.  Recorte las uas en forma recta. No escarbe debajo de las uas o alrededor Union Pacific Corporation. Lime los bordes de las uas con una lima o esmeril.  No corte las durezas o callosidades, ni trate de quitarlas con medicamentos.  Use calcetines de algodn o medias [employer]. Asegrese de que no le Coca Cola. Nouse calcetines que le lleguen a las rodillas, ya que podran disminuir el flujo de sangre a las piernas.  Use zapatos de cuero que le queden bien y que sean acolchados. Para amoldar  los zapatos, clcelos slo algunas horas por da. Esto evitar lesiones en los pies. Revise siempre los zapatos antes de ponerlos para asegurarse de que no haya objetos en su interior.  No cruce las piernas. Esto puede disminuir el flujo de sangre a los pies.  Si algo le ha raspado, cortado o lastimado la piel de los pies, mantenga la piel de esa zona limpia y [place]. Debe higienizar estas zonas con agua y un jabn suave. No limpie la zona con agua oxigenada, alcohol ni yodo.  Cuando se quite un vendaje adhesivo, asegrese de no daar la piel.  Si tiene una herida, obsrvela varias veces por da para asegurarse de que se est curando.  No use bolsas de agua caliente ni almohadillas trmicas. Podran causar quemaduras. Si ha perdido la sensibilidad en los pies o las piernas, no sabr lo que le est sucediendo hasta que sea demasiado tarde.  Asegrese de que su mdico le haga un examen completo de los pies por lo menos una vez al ao, o con ms frecuencia si usted tiene [employer]. Informe todos los cortes, llagas o moretones a su mdico inmediatamente. SOLICITE ATENCIN MDICA SI:   Tiene una lesin que no se cura.  Tiene cortes o rajaduras en la piel.  Tiene una ua encarnada.  Nota una zona irritada en las piernas o los pies.  Siente una sensacin de ardor u hormigueo en las piernas o los pies.  Siente dolor o calambres en las piernas o los pies.  Las piernas o los pies estn adormecidos.  Siente los pies siempre fros. SOLICITE ATENCIN MDICA DE INMEDIATO SI:   Presenta enrojecimiento, hinchazn o aumento del dolor en una herida.  Nota una lnea roja que sube por pierna.  Aparece pus en la herida.  Le sube la fiebre o segn lo que le indique el mdico.  Advierte un olor ftido que proviene de una lcera o una herida.   Esta informacin no tiene Marine scientist el consejo del mdico. Asegrese de hacerle al mdico cualquier pregunta que tenga.   Document Released: 11/24/2005 Document Revised: 07/27/2013 Elsevier Interactive Patient Education [employer].

## 2016-04-14 NOTE — Progress Notes (Signed)
     Subjective: Chief Complaint  Patient presents with  . Foot Pain     HPI: Blake Vaughn is a 55 y.o. presenting to clinic today to discuss the following:  #Follow-up Foot Infection/Pain: -location: left foot -taking medications - yes; still has several days of antibiotics left. States he is taking them like prescribed -having less pain  -denies any trauma or puncture to foot that caused infection -does not have any cleaning routine for wound; just uses vinager and water every night to keep foot clean -Takes pain pills prescribed for pain; they do help  -wants to know results of x-ray  -states that he thinks wound is improving. Less red and painful. Open sore still drains some. -denies fevers, chills, numbness and tingling in foot  #DM2 -patient states he is unsure of how his diabetes is doing; does not monitor blood sugars -last A1c last month 8.7 -does not have glucometer to measure sugars -has been trying to eat healthy but unsure if what he is eating is right -has not followed up with provider for diabetes in a while -does take his diabetes medication -never had foot wound before  ROS noted in HPI.  Past Medical, Surgical, Social, and Family History Reviewed & Updated per EMR.   Objective: BP 124/64 mmHg  Pulse 77  Temp(Src) 98 F (36.7 C)  Wt 197 lb (89.359 kg) Vitals and nursing notes reviewed  Physical Exam  Constitutional: He is well-developed, well-nourished, and in no distress.  Cardiovascular: Normal rate and intact distal pulses.   Pulmonary/Chest: Effort normal.  Musculoskeletal:       Left foot: There is tenderness and swelling. There is normal range of motion.       Feet:  Normal ROM of left foot.    Assessment/Plan: Please see problem based Assessment and Plan   Meds ordered this encounter  Medications  . Blood Glucose Monitoring Suppl (RELION PRIME MONITOR) DEVI    Sig: Use device as instructed    Dispense:  1 Device    Refill:   0  . glucose blood test strip    Sig: Use as instructed    Dispense:  100 each    Refill:  12  . RELION LANCETS STANDARD 21G MISC    Sig: Use as instructed    Dispense:  200 each    Refill:  6  . Alcohol Swabs PADS    Sig: Use as instructed    Dispense:  100 each    Refill:  Eve[place], DO 04/14/2016, 9:17 AM PGY-2, [place]

## 2016-04-15 NOTE — Assessment & Plan Note (Addendum)
Left foot infection appears to be improving per patient. Less pain and erythema. He is well-appearing and afebrile today in clinic. Continue course of antibiotics. Reviewed imaging results with patient. No signs of osteomyelitis just soft tissue infection. Counseled patient on proper wound care and supplies provided. If wound not healed by follow-up consider wound care clinic referral. Patient to return to clinic in 2 weeks after completing antibiotics to have wound reexamined. Discussed return precautions. Handout given on wound care and diabetic foot infections.

## 2016-04-17 ENCOUNTER — Ambulatory Visit: Payer: Self-pay | Admitting: Student

## 2016-04-22 ENCOUNTER — Ambulatory Visit (HOSPITAL_COMMUNITY)
Admission: EM | Admit: 2016-04-22 | Discharge: 2016-04-22 | Disposition: A | Discharge status: Home / Self Care | Payer: Self-pay | Attending: Family Medicine | Admitting: Family Medicine

## 2016-04-22 ENCOUNTER — Encounter (HOSPITAL_COMMUNITY): Payer: Self-pay | Admitting: *Deleted

## 2016-04-22 DIAGNOSIS — M542 Cervicalgia: Secondary | ICD-10-CM

## 2016-04-22 MED ORDER — DICLOFENAC POTASSIUM 50 MG PO TABS: 50.0000 mg | ORAL_TABLET | Freq: Three times a day (TID) | ORAL | Status: DC

## 2016-04-22 MED ORDER — KETOROLAC TROMETHAMINE 30 MG/ML IJ SOLN
30.0000 mg | Freq: Once | INTRAMUSCULAR | Status: AC
  Administered 2016-04-22: 30 mg via INTRAMUSCULAR

## 2016-04-22 MED ORDER — CYCLOBENZAPRINE HCL 5 MG PO TABS: 5.0000 mg | ORAL_TABLET | Freq: Three times a day (TID) | ORAL | Status: DC | PRN

## 2016-04-22 MED ORDER — KETOROLAC TROMETHAMINE 30 MG/ML IJ SOLN
INTRAMUSCULAR | Status: AC
  Filled 2016-04-22: qty 1

## 2016-04-22 NOTE — ED Notes (Addendum)
Pt  Reports  Neck  Pain  And  Bilateral  Shoulder  Pain        X  3  Weeks      No  Injury      Pt reports   Was  Seen   About  1  Month  Ago  For  Similar  Symptoms        Recently  Treated  For   A  Foot  Infection  But  He  States  He  Is  Doing  Much  Better  He  Is  Sitting  Upright on the  Exam table  Speaking  In  Complete  sentances  In no  Acute  Distress

## 2016-04-22 NOTE — ED Provider Notes (Signed)
CSN: [id number]     Arrival date & time 04/22/16  1905 History   First MD Initiated Contact with Patient 04/22/16 2016     Chief Complaint  Patient presents with  . Neck Pain   (Consider location/radiation/quality/duration/timing/severity/associated sxs/prior Treatment) Patient is a 55 y.o. male presenting with neck pain. The history is provided by the patient.  Neck Pain Pain location:  Occipital region Quality:  Stabbing Pain radiates to:  Does not radiate Pain severity:  Mild Onset quality:  Gradual Duration:  3 weeks Progression:  Unchanged Chronicity:  Recurrent Context: not recent injury   Worsened by:  Position Associated symptoms: headaches   Associated symptoms: no numbness, no paresis and no weakness     Past Medical History  Diagnosis Date  . Hypertension 11/27/2011  . Diabetes mellitus   . Hypercholesterolemia   . Lymphoma [facility])     gets annual chemo last tx Feb 2013  . Lymphoma ([place])    History reviewed. No pertinent past surgical history. History reviewed. No pertinent family history. Social History  Substance Use Topics  . Smoking status: Former Smoker    Quit date: 12/08/2006  . Smokeless tobacco: Former Systems developer    Quit date: 11/20/2006  . Alcohol Use: No    Review of Systems  Constitutional: Negative.   Musculoskeletal: Positive for neck pain. Negative for neck stiffness.  Neurological: Positive for headaches. Negative for weakness and numbness.  All other systems reviewed and are negative.   Allergies  Iohexol  Home Medications   Prior to Admission medications   Medication Sig Start Date End Date Taking? Authorizing Provider  Alcohol Swabs PADS Use as instructed 04/14/16   [name], DO  Blood Glucose Monitoring Suppl (RELION PRIME MONITOR) DEVI Use device as instructed 04/14/16   [name], DO  ciprofloxacin (CIPRO) 500 MG tablet Take 1 tablet (500 mg total) by mouth 2 (two) times daily. 04/10/16   Aquilla Hacker, MD  clindamycin  (CLEOCIN) 300 MG capsule Take 1 capsule (300 mg total) by mouth 3 (three) times daily. 04/10/16   Aquilla Hacker, MD  glipiZIDE (GLUCOTROL) 10 MG tablet Take 1 tablet (10 mg total) by mouth 2 (two) times daily before a meal. 09/07/15   Alyssa A Haney, MD  glucose blood test strip Use as instructed 04/14/16   [name], DO  metFORMIN (GLUCOPHAGE) 1000 MG tablet Take 1 tablet (1,000 mg total) by mouth 2 (two) times daily with a meal. 04/27/15   Renee A Kuneff, DO  RELION LANCETS STANDARD 21G MISC Use as instructed 04/14/16   [name], DO  traMADol (ULTRAM) 50 MG tablet Take 1 tablet (50 mg total) by mouth every 8 (eight) hours as needed for moderate pain. 04/02/16   Zenia Resides, MD   Meds Ordered and Administered this Visit  Medications - No data to display  BP 115/81 mmHg  Pulse 93  Temp(Src) 98.1 F (36.7 C) (Oral)  Resp 16  SpO2 96% No data found.   Physical Exam  Constitutional: He is oriented to person, place, and time. He appears well-developed and well-nourished.  Neck: Spinous process tenderness and muscular tenderness present. No rigidity. Decreased range of motion present. No erythema present.  Musculoskeletal: He exhibits tenderness.       Cervical back: He exhibits decreased range of motion, tenderness, pain and spasm. He exhibits no swelling and normal pulse.  Neurological: He is alert and oriented to person, place, and time. No cranial nerve  deficit. Coordination normal.  Skin: Skin is warm and dry.  Nursing note and vitals reviewed.   ED Course  Procedures (including critical care time)  Labs Review Labs Reviewed - No data to display  Imaging Review No results found.   Visual Acuity Review  Right Eye Distance:   Left Eye Distance:   Bilateral Distance:    Right Eye Near:   Left Eye Near:    Bilateral Near:         MDM  No diagnosis found.  Meds ordered this encounter  Medications  . ketorolac (TORADOL) 30 MG/ML injection 30 mg    Sig:    . diclofenac (CATAFLAM) 50 MG tablet    Sig: Take 1 tablet (50 mg total) by mouth 3 (three) times daily.    Dispense:  30 tablet    Refill:  1  . cyclobenzaprine (FLEXERIL) 5 MG tablet    Sig: Take 1 tablet (5 mg total) by mouth 3 (three) times daily as needed for muscle spasms.    Dispense:  30 tablet    Refill:  0      Billy Fischer, MD 04/22/16 2037

## 2016-04-22 NOTE — Discharge Instructions (Signed)
Use heat on neck and medicine as prescribed, see orthopedist if further problems

## 2016-04-24 ENCOUNTER — Other Ambulatory Visit: Payer: Self-pay | Admitting: Student

## 2016-04-24 NOTE — Telephone Encounter (Signed)
Patient asks PCP refill for diabetes medication. Please, follow up.

## 2016-04-25 MED ORDER — GLIPIZIDE 10 MG PO TABS: 10.0000 mg | ORAL_TABLET | Freq: Two times a day (BID) | ORAL | Status: DC

## 2016-04-25 MED ORDER — METFORMIN HCL 1000 MG PO TABS: 1000.0000 mg | ORAL_TABLET | Freq: Two times a day (BID) | ORAL | Status: DC

## 2016-05-11 ENCOUNTER — Ambulatory Visit (HOSPITAL_COMMUNITY)
Admission: EM | Admit: 2016-05-11 | Discharge: 2016-05-11 | Disposition: A | Discharge status: Home / Self Care | Payer: Self-pay | Attending: Family Medicine | Admitting: Family Medicine

## 2016-05-11 ENCOUNTER — Encounter (HOSPITAL_COMMUNITY): Payer: Self-pay

## 2016-05-11 DIAGNOSIS — M542 Cervicalgia: Secondary | ICD-10-CM

## 2016-05-11 MED ORDER — NAPROXEN 375 MG PO TABS: 375.0000 mg | ORAL_TABLET | Freq: Two times a day (BID) | ORAL | Status: DC

## 2016-05-11 MED ORDER — KETOROLAC TROMETHAMINE 60 MG/2ML IM SOLN
INTRAMUSCULAR | Status: AC
  Filled 2016-05-11: qty 2

## 2016-05-11 MED ORDER — KETOROLAC TROMETHAMINE 60 MG/2ML IM SOLN
60.0000 mg | Freq: Once | INTRAMUSCULAR | Status: AC
  Administered 2016-05-11: 60 mg via INTRAMUSCULAR

## 2016-05-11 NOTE — ED Notes (Signed)
Pt has had a headache located mainly in the back of his head since Friday Pt stated that the pain radiates into his shoulders occasionally Pt alert and oriented

## 2016-05-11 NOTE — ED Provider Notes (Signed)
CSN: [id number]     Arrival date & time 05/11/16  1257 History   First MD Initiated Contact with Patient 05/11/16 1316     Chief Complaint  Patient presents with  . Headache   (Consider location/radiation/quality/duration/timing/severity/associated sxs/prior Treatment) HPI Comments: 55 year old Hispanic male speaks English presents with pain to the posterior aspect of the head and neck. He was seen in this urgent care 2 weeks ago for the same complaint. Just prior to being seen he noted that he had been having pain for approximately 3 weeks. Pain is worse with movement. Denies any trauma. Pain is located at the base of the occipital rim and along the associated muscular structures. Denies focal paresthesias or weakness. His job entails having to look down for long periods of time which tend to exacerbate his symptoms.  Patient is a 55 y.o. male presenting with headaches.  Headache Associated symptoms: neck pain   Associated symptoms: no dizziness, no fatigue, no fever, no neck stiffness, no numbness and no weakness     Past Medical History  Diagnosis Date  . Hypertension 11/27/2011  . Diabetes mellitus   . Hypercholesterolemia   . Lymphoma [facility])     gets annual chemo last tx Feb 2013  . Lymphoma ([place])    History reviewed. No pertinent past surgical history. History reviewed. No pertinent family history. Social History  Substance Use Topics  . Smoking status: Former Smoker    Quit date: 12/08/2006  . Smokeless tobacco: Former Systems developer    Quit date: 11/20/2006  . Alcohol Use: No    Review of Systems  Constitutional: Positive for activity change. Negative for fever and fatigue.  Respiratory: Negative.   Gastrointestinal: Negative.   Genitourinary: Negative.   Musculoskeletal: Positive for neck pain. Negative for neck stiffness.       As per HPI  Skin: Negative.   Neurological: Positive for headaches. Negative for dizziness, weakness and numbness.    Allergies  Iohexol  Home  Medications   Prior to Admission medications   Medication Sig Start Date End Date Taking? Authorizing Provider  glipiZIDE (GLUCOTROL) 10 MG tablet TAKE ONE TABLET BY MOUTH TWICE DAILY BEFORE A MEAL 04/25/16  Yes Alyssa A Lincoln Brigham, MD  metFORMIN (GLUCOPHAGE) 1000 MG tablet Take 1 tablet (1,000 mg total) by mouth 2 (two) times daily with a meal. 04/25/16  Yes Veatrice Bourbon, MD  Alcohol Swabs PADS Use as instructed 04/14/16   [name], DO  Blood Glucose Monitoring Suppl (RELION PRIME MONITOR) DEVI Use device as instructed 04/14/16   [name], DO  ciprofloxacin (CIPRO) 500 MG tablet Take 1 tablet (500 mg total) by mouth 2 (two) times daily. 04/10/16   Aquilla Hacker, MD  clindamycin (CLEOCIN) 300 MG capsule Take 1 capsule (300 mg total) by mouth 3 (three) times daily. 04/10/16   Aquilla Hacker, MD  cyclobenzaprine (FLEXERIL) 5 MG tablet Take 1 tablet (5 mg total) by mouth 3 (three) times daily as needed for muscle spasms. 04/22/16   Billy Fischer, MD  diclofenac (CATAFLAM) 50 MG tablet Take 1 tablet (50 mg total) by mouth 3 (three) times daily. 04/22/16   Billy Fischer, MD  glipiZIDE (GLUCOTROL) 10 MG tablet Take 1 tablet (10 mg total) by mouth 2 (two) times daily before a meal. 04/25/16   Alyssa A Haney, MD  glucose blood test strip Use as instructed 04/14/16   [name], DO  naproxen (NAPROSYN) 375 MG tablet Take 1 tablet (375  mg total) by mouth 2 (two) times daily. Prn neck and head pain 05/11/16   Janne Napoleon, NP  RELION LANCETS STANDARD 21G MISC Use as instructed 04/14/16   [name], DO  traMADol (ULTRAM) 50 MG tablet Take 1 tablet (50 mg total) by mouth every 8 (eight) hours as needed for moderate pain. 04/02/16   Zenia Resides, MD   Meds Ordered and Administered this Visit   Medications  ketorolac (TORADOL) injection 60 mg (not administered)    BP 129/77 mmHg  Pulse 90  Temp(Src) 97.7 F (36.5 C) (Oral)  Resp 12  SpO2 97% No data found.   Physical Exam  Constitutional: He  is oriented to person, place, and time. He appears well-developed and well-nourished.  HENT:  Head: Normocephalic and atraumatic.  Eyes: EOM are normal.  Neck: Normal range of motion. Neck supple.  Tenderness along the lower occipital rim particularly at the midline and along muscle attachment areas. Tenderness to the posterior cervical musculature. No midline or spinal tenderness. Demonstrates full range of motion of the neck. No swelling, discoloration, deformity.  Cardiovascular: Normal rate.   Pulmonary/Chest: Effort normal.  Neurological: He is alert and oriented to person, place, and time. No cranial nerve deficit.  Skin: Skin is warm and dry.  Psychiatric: He has a normal mood and affect.  Nursing note and vitals reviewed.   ED Course  Procedures (including critical care time)  Labs Review Labs Reviewed - No data to display  Imaging Review No results found.   Visual Acuity Review  Right Eye Distance:   Left Eye Distance:   Bilateral Distance:    Right Eye Near:   Left Eye Near:    Bilateral Near:         MDM   1. Cervical muscle pain    Meds ordered this encounter  Medications  . ketorolac (TORADOL) injection 60 mg    Sig:   . naproxen (NAPROSYN) 375 MG tablet    Sig: Take 1 tablet (375 mg total) by mouth 2 (two) times daily. Prn neck and head pain    Dispense:  20 tablet    Refill:  0    Order Specific Question:  Supervising Provider    Answer:  Ihor Gully D [[id number]]   Heat, stretches, no prolonged head down positioning.    Janne Napoleon, NP 05/11/16 1329

## 2016-09-05 ENCOUNTER — Ambulatory Visit (HOSPITAL_BASED_OUTPATIENT_CLINIC_OR_DEPARTMENT_OTHER): Payer: Self-pay | Admitting: Nurse Practitioner

## 2016-09-05 ENCOUNTER — Ambulatory Visit (HOSPITAL_COMMUNITY)
Admission: EM | Admit: 2016-09-05 | Discharge: 2016-09-05 | Disposition: A | Discharge status: Home / Self Care | Payer: Self-pay | Attending: Family Medicine | Admitting: Family Medicine

## 2016-09-05 ENCOUNTER — Encounter (HOSPITAL_COMMUNITY): Payer: Self-pay | Admitting: Emergency Medicine

## 2016-09-05 DIAGNOSIS — Z8572 Personal history of non-Hodgkin lymphomas: Secondary | ICD-10-CM

## 2016-09-05 DIAGNOSIS — G44201 Tension-type headache, unspecified, intractable: Secondary | ICD-10-CM

## 2016-09-05 DIAGNOSIS — C8200 Follicular lymphoma grade I, unspecified site: Secondary | ICD-10-CM

## 2016-09-05 DIAGNOSIS — G44209 Tension-type headache, unspecified, not intractable: Secondary | ICD-10-CM

## 2016-09-05 DIAGNOSIS — R51 Headache: Secondary | ICD-10-CM

## 2016-09-05 MED ORDER — CYCLOBENZAPRINE HCL 10 MG PO TABS: 10.0000 mg | ORAL_TABLET | Freq: Three times a day (TID) | ORAL | 0 refills | Status: DC | PRN

## 2016-09-05 MED ORDER — KETOROLAC TROMETHAMINE 60 MG/2ML IM SOLN
INTRAMUSCULAR | Status: AC
  Filled 2016-09-05: qty 2

## 2016-09-05 MED ORDER — ALCOHOL SWABS PADS: MEDICATED_PAD | 6 refills | Status: DC

## 2016-09-05 MED ORDER — HYDROCODONE-ACETAMINOPHEN 5-325 MG PO TABS: 1.0000 | ORAL_TABLET | Freq: Four times a day (QID) | ORAL | 0 refills | Status: DC | PRN

## 2016-09-05 MED ORDER — KETOROLAC TROMETHAMINE 60 MG/2ML IM SOLN
60.0000 mg | Freq: Once | INTRAMUSCULAR | Status: AC
  Administered 2016-09-05: 60 mg via INTRAMUSCULAR

## 2016-09-05 MED ORDER — CYCLOBENZAPRINE HCL 5 MG PO TABS: 5.0000 mg | ORAL_TABLET | Freq: Three times a day (TID) | ORAL | 0 refills | Status: DC | PRN

## 2016-09-05 NOTE — ED Triage Notes (Signed)
Pt is here for recurrent HA on the back of head onset 3 days  Has been here mult times for similar reasons  Has MRI appt on 10/3   A&O x4... NAD

## 2016-09-05 NOTE — ED Provider Notes (Addendum)
CSN: [id number]     Arrival date & time 09/05/16  [id number] History   None    Chief Complaint  Patient presents with  . Headache   (Consider location/radiation/quality/duration/timing/severity/associated sxs/prior Treatment) The history is provided by the patient.  Headache  Pain location:  Occipital Quality:  Dull Radiates to:  Does not radiate Severity currently:  5/10 Timing:  Intermittent Progression:  Unchanged Chronicity:  Recurrent Similar to prior headaches: yes   Context: not activity and not exposure to bright light   Relieved by:  Nothing Associated symptoms: no blurred vision, no congestion, no dizziness, no ear pain, no eye pain, no facial pain, no fever, no focal weakness, no neck pain, no neck stiffness, no numbness and no photophobia     Past Medical History:  Diagnosis Date  . Diabetes mellitus   . Hypercholesterolemia   . Hypertension 11/27/2011  . Lymphoma [facility])    gets annual chemo last tx Feb 2013  . Lymphoma ([place])    History reviewed. No pertinent surgical history. History reviewed. No pertinent family history. Social History  Substance Use Topics  . Smoking status: Former Smoker    Quit date: 12/08/2006  . Smokeless tobacco: Former Systems developer    Quit date: 11/20/2006  . Alcohol use No    Review of Systems  Constitutional: Negative for fever.  HENT: Negative for congestion and ear pain.   Eyes: Negative for blurred vision, photophobia and pain.  Musculoskeletal: Negative for neck pain and neck stiffness.  Neurological: Positive for headaches. Negative for dizziness, focal weakness and numbness.  All other systems reviewed and are negative.   Allergies  Iohexol  Home Medications   Prior to Admission medications   Medication Sig Start Date End Date Taking? Authorizing Provider  Alcohol Swabs PADS Use as instructed 09/05/16   [name] Bailea Beed, NP  Blood Glucose Monitoring Suppl (RELION PRIME MONITOR) DEVI Use device as instructed 04/14/16   [name], DO  ciprofloxacin (CIPRO) 500 MG tablet Take 1 tablet (500 mg total) by mouth 2 (two) times daily. 04/10/16   Aquilla Hacker, MD  clindamycin (CLEOCIN) 300 MG capsule Take 1 capsule (300 mg total) by mouth 3 (three) times daily. 04/10/16   Aquilla Hacker, MD  cyclobenzaprine (FLEXERIL) 10 MG tablet Take 1 tablet (10 mg total) by mouth 3 (three) times daily as needed for muscle spasms (Headache). 09/05/16   [name] Sigrid Schwebach, NP  cyclobenzaprine (FLEXERIL) 5 MG tablet Take 1 tablet (5 mg total) by mouth 3 (three) times daily as needed for muscle spasms. 09/05/16   [name] Yaneliz Radebaugh, NP  diclofenac (CATAFLAM) 50 MG tablet Take 1 tablet (50 mg total) by mouth 3 (three) times daily. 04/22/16   Billy Fischer, MD  glipiZIDE (GLUCOTROL) 10 MG tablet Take 1 tablet (10 mg total) by mouth 2 (two) times daily before a meal. 04/25/16   Alyssa A Haney, MD  glipiZIDE (GLUCOTROL) 10 MG tablet TAKE ONE TABLET BY MOUTH TWICE DAILY BEFORE A MEAL 04/25/16   Alyssa A Haney, MD  glucose blood test strip Use as instructed 04/14/16   [name], DO  HYDROcodone-acetaminophen (NORCO/VICODIN) 5-325 MG tablet Take 1-2 tablets by mouth every 6 (six) hours as needed. 09/05/16   [name] Jakiera Ehler, NP  metFORMIN (GLUCOPHAGE) 1000 MG tablet Take 1 tablet (1,000 mg total) by mouth 2 (two) times daily with a meal. 04/25/16   Alyssa A Haney, MD  naproxen (NAPROSYN) 375 MG tablet Take 1 tablet (375  mg total) by mouth 2 (two) times daily. Prn neck and head pain 05/11/16   Janne Napoleon, NP  RELION LANCETS STANDARD 21G MISC Use as instructed 04/14/16   [name], DO  traMADol (ULTRAM) 50 MG tablet Take 1 tablet (50 mg total) by mouth every 8 (eight) hours as needed for moderate pain. 04/02/16   Zenia Resides, MD   Meds Ordered and Administered this Visit   Medications  ketorolac (TORADOL) injection 60 mg (60 mg Intramuscular Given 09/05/16 2056)    BP 125/66 (BP Location: Left Arm)   Pulse 82   Temp 98.5 F (36.9 C) (Oral)    Resp 12   SpO2 97%  No data found.   Physical Exam  Constitutional: He is oriented to person, place, and time. He appears well-developed and well-nourished.  HENT:  Head: Normocephalic and atraumatic.  Right Ear: Tympanic membrane normal.  Left Ear: Tympanic membrane normal.  Nose: Nose normal.  Mouth/Throat: Oropharynx is clear and moist.  Eyes: Conjunctivae, EOM and lids are normal. Pupils are equal, round, and reactive to light.  Cardiovascular: Normal rate.   Pulmonary/Chest: Effort normal.  Neurological: He is alert and oriented to person, place, and time. He has normal strength. No cranial nerve deficit. Coordination and gait normal.  Skin: Skin is warm and dry.  Psychiatric: He has a normal mood and affect.  Nursing note and vitals reviewed.   Urgent Care Course   Clinical Course    Procedures (including critical care time)  Labs Review Labs Reviewed - No data to display  Imaging Review No results found.   Visual Acuity Review  Right Eye Distance:   Left Eye Distance:   Bilateral Distance:    Right Eye Near:   Left Eye Near:    Bilateral Near:         MDM   1. Intractable tension-type headache, unspecified chronicity pattern   Non-focal neuro exam, Toradol 60 im in office. Rx's for Flexeril and Norco. Pt to f/u w/ Dr Julien Nordmann Monday as planned. H/A precautions discussed and provided in print.    Jeryl Columbia, NP 09/05/16 [place], NP 09/08/16 [id number]

## 2016-09-05 NOTE — Discharge Instructions (Signed)
The possible cause of your symptoms is a tension type headache. Please read over your instructions. Take the medication for pain as directed. Follow up with Dr Julien Nordmann Monday as planned.   La causa posible de sus sntomas es un dolor de cabeza tipo tensin. Lea por favor sus instrucciones. Tome el medicamento para el dolor segn lo indicado. Seguir con el Dr. Mahala Menghini como estaba previsto.

## 2016-09-08 ENCOUNTER — Telehealth: Payer: Self-pay | Admitting: Internal Medicine

## 2016-09-08 ENCOUNTER — Telehealth: Payer: Self-pay | Admitting: Medical Oncology

## 2016-09-08 ENCOUNTER — Telehealth: Payer: Self-pay | Admitting: Nurse Practitioner

## 2016-09-08 ENCOUNTER — Other Ambulatory Visit: Payer: Self-pay | Admitting: Nurse Practitioner

## 2016-09-08 ENCOUNTER — Encounter: Payer: Self-pay | Admitting: Nurse Practitioner

## 2016-09-08 DIAGNOSIS — R519 Headache, unspecified: Secondary | ICD-10-CM

## 2016-09-08 DIAGNOSIS — R51 Headache: Secondary | ICD-10-CM

## 2016-09-08 DIAGNOSIS — C8200 Follicular lymphoma grade I, unspecified site: Secondary | ICD-10-CM

## 2016-09-08 MED ORDER — PREDNISONE 10 MG PO TABS: ORAL_TABLET | ORAL | 0 refills | Status: DC

## 2016-09-08 MED ORDER — DIPHENHYDRAMINE HCL 25 MG PO TABS: ORAL_TABLET | ORAL | 0 refills | Status: DC

## 2016-09-08 NOTE — Progress Notes (Signed)
SYMPTOM MANAGEMENT CLINIC    Chief Complaint: Headache  HPI:  Blake Vaughn 55 y.o. 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  Currently undergoing observation only.    No history exists.    Review of Systems  Constitutional: Positive for malaise/fatigue.       Night sweats  HENT: Positive for sore throat.   Musculoskeletal: Positive for neck pain.  Neurological: Positive for dizziness and headaches.  All other systems reviewed and are negative.   Past Medical History:  Diagnosis Date  . Diabetes mellitus   . Hypercholesterolemia   . Hypertension 11/27/2011  . Lymphoma [facility])    gets annual chemo last tx Feb 2013  . Lymphoma ([place])     History reviewed. No pertinent surgical history.  has TINEA PEDIS; Follicular lymphoma ([place]); Diabetes type 2, uncontrolled ([place]); Pure hypercholesterolemia; DIABETIC CATARACT; UNSPECIFIED DENTAL CARIES; ERECTILE DYSFUNCTION, ORGANIC; FOOT PAIN, BILATERAL; TOBACCO USE, QUIT; Headache; Hypertension; Adenitis; Abdominal pain; Leg cramps, sleep related; and Type 2 diabetes mellitus with left diabetic foot infection ([place]) on his problem list.    is allergic to iohexol.    Medication List       Accurate as of 09/05/16  8:25 PM. Always use your most recent med list.          Alcohol Swabs Pads Use as instructed   ciprofloxacin 500 MG tablet Commonly known as:  CIPRO Take 1 tablet (500 mg total) by mouth 2 (two) times daily.   clindamycin 300 MG capsule Commonly known as:  CLEOCIN Take 1 capsule (300 mg total) by mouth 3 (three) times daily.   cyclobenzaprine 10 MG tablet Commonly known as:  FLEXERIL Take 1 tablet (10 mg total) by mouth 3 (three) times daily as needed for muscle spasms (Headache).   cyclobenzaprine 5 MG tablet Commonly known as:  FLEXERIL Take 1 tablet (5 mg total) by mouth 3 (three) times daily as needed for muscle spasms.   diclofenac 50 MG tablet Commonly known as:  CATAFLAM Take 1 tablet (50 mg total)  by mouth 3 (three) times daily.   glipiZIDE 10 MG tablet Commonly known as:  GLUCOTROL Take 1 tablet (10 mg total) by mouth 2 (two) times daily before a meal.   glipiZIDE 10 MG tablet Commonly known as:  GLUCOTROL TAKE ONE TABLET BY MOUTH TWICE DAILY BEFORE A MEAL   glucose blood test strip Use as instructed   HYDROcodone-acetaminophen 5-325 MG tablet Commonly known as:  NORCO/VICODIN Take 1-2 tablets by mouth every 6 (six) hours as needed.   metFORMIN 1000 MG tablet Commonly known as:  GLUCOPHAGE Take 1 tablet (1,000 mg total) by mouth 2 (two) times daily with a meal.   naproxen 375 MG tablet Commonly known as:  NAPROSYN Take 1 tablet (375 mg total) by mouth 2 (two) times daily. Prn neck and head pain   RELION LANCETS STANDARD 21G Misc Use as instructed   RELION PRIME MONITOR Devi Use device as instructed   traMADol 50 MG tablet Commonly known as:  ULTRAM Take 1 tablet (50 mg total) by mouth every 8 (eight) hours as needed for moderate pain.        PHYSICAL EXAMINATION  Oncology Vitals 09/05/2016 05/11/2016  Height - -  Weight - -  Weight (lbs) - -  BMI (kg/m2) - -  Temp 98.5 97.7  Pulse 82 90  Resp 12 12  Resp (Historical as of 07/08/12) - -  SpO2 97 97  BSA (m2) - -   BP  Readings from Last 2 Encounters:  09/05/16 125/66  05/11/16 129/77    Physical Exam  Constitutional: He is oriented to person, place, and time and well-developed, well-nourished, and in no distress.  HENT:  Head: Normocephalic and atraumatic.  Eyes: Conjunctivae and EOM are normal. Right eye exhibits no discharge. Left eye exhibits no discharge. No scleral icterus.  Neck: Normal range of motion.  Pulmonary/Chest: Effort normal. No respiratory distress.  Musculoskeletal: Normal range of motion.  Neurological: He is alert and oriented to person, place, and time. Gait normal.  Psychiatric: Affect normal.    LABORATORY DATA:. No visits with results within 3 Day(s) from this visit.    Latest known visit with results is:  Office Visit on 04/02/2016  Component Date Value Ref Range Status  . Cholesterol 04/02/2016 241* 125 - 200 mg/dL Final  . Triglycerides 04/02/2016 486* <150 mg/dL Final  . HDL 04/02/2016 32* >=40 mg/dL Final  . Total CHOL/HDL Ratio 04/02/2016 7.5* <=5.0 Ratio Final  . VLDL 04/02/2016 NOT CALC  <30 mg/dL Final   Comment:   Not calculated due to Triglyceride >400. Suggest ordering Direct LDL (Unit Code: [phone]).   . LDL Cholesterol 04/02/2016 NOT CALC  <130 mg/dL Final   Comment:   Not calculated due to Triglyceride >400. Suggest ordering Direct LDL (Unit Code: [id number]).   Total Cholesterol/HDL Ratio:CHD Risk                        Coronary Heart Disease Risk Table                                        Men       Women          1/2 Average Risk              3.4        3.3              Average Risk              5.0        4.4           2X Average Risk              9.6        7.1           3X Average Risk             23.4       11.0 Use the calculated Patient Ratio above and the CHD Risk table  to determine the patient's CHD Risk.   . Sodium 04/02/2016 138  135 - 146 mmol/L Final  . Potassium 04/02/2016 4.4  3.5 - 5.3 mmol/L Final  . Chloride 04/02/2016 105  98 - 110 mmol/L Final  . CO2 04/02/2016 24  20 - 31 mmol/L Final  . Glucose, Bld 04/02/2016 118* 65 - 99 mg/dL Final  . BUN 04/02/2016 16  7 - 25 mg/dL Final  . Creat 04/02/2016 0.80  0.70 - 1.33 mg/dL Final  . Total Bilirubin 04/02/2016 0.4  0.2 - 1.2 mg/dL Final  . Alkaline Phosphatase 04/02/2016 68  40 - 115 U/L Final  . AST 04/02/2016 17  10 - 35 U/L Final  . ALT 04/02/2016 25  9 - 46 U/L Final  . Total Protein 04/02/2016 6.7  6.1 - 8.1 g/dL  Final  . Albumin 04/02/2016 4.0  3.6 - 5.1 g/dL Final  . Calcium 04/02/2016 9.7  8.6 - 10.3 mg/dL Final  . GFR, Est African American 04/02/2016 >89  >=60 mL/min Final  . GFR, Est Non African American 04/02/2016 >89  >=60 mL/min Final   Comment:    The estimated GFR is a calculation valid for adults (>=37 years old) that uses the CKD-EPI algorithm to adjust for age and sex. It is   not to be used for children, pregnant women, hospitalized patients,    patients on dialysis, or with rapidly changing kidney function. According to the NKDEP, eGFR >89 is normal, 60-89 shows mild impairment, 30-59 shows moderate impairment, 15-29 shows severe impairment and <15 is ESRD.     Marland Kitchen Hemoglobin A1C 04/02/2016 8.7   Final    RADIOGRAPHIC STUDIES: No results found.  ASSESSMENT/PLAN:    Headache Patient presented as a walk-in today at the cancer center with complaint of chronic headaches, a chronic sore throat, occasional night sweats, and occasional dizziness.  He states that these symptoms have been occurring for at least 3 months.  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  Patient states he is also diabetic; and follows up with his primary care physician on a regular basis.  He denies any recent fevers or chills.  He denies any URI symptoms or UTI symptoms.  Brief exam of the patient revealed.  Patient was nontoxic in neurologically intact.  Patient had been working at his job earlier today.  Advice patient would review all complaints with Dr. Julien Nordmann; and then schedule patient for both labs and a follow-up appointment here at the [place] on Tuesday, 09/09/2016.  In the meantime-patient was advised to go directly to the emergency department for any worsening symptoms whatsoever.  Follicular lymphoma ([place]) Patient is status post R CHOP chemotherapy regimen that was completed in 2010; and then he also underwent 2 years of maintenance Rituxan therapy that was completed in 2012.  He currently is undergoing observation only.  Patient will be scheduled for labs and a follow-up visit here at the [facility] on Tuesday, October 13 thousand 17.  He  was encouraged to go directed to the emergency department in the meantime with any new or worsening symptoms whatsoever.   Patient stated understanding of all instructions; and was in agreement with this plan of care. The patient knows to call the clinic with any problems, questions or concerns.   Total time spent with patient was 15 minutes;  with greater than 75 percent of that time spent in face to face counseling regarding patient's symptoms,  and coordination of care and follow up.  Disclaimer:This dictation was prepared with Dragon/digital dictation along with [employer]. Any transcriptional errors that result from this process are unintentional.  Drue Second, NP 09/08/2016

## 2016-09-08 NOTE — Telephone Encounter (Signed)
R/s appts per LOs. Spoke with pt to confirm appt date/times. Pt will get printed schedule once he arrives to pick up contrast. Interpreter is assigned for 10/3 and 10/4 appts

## 2016-09-08 NOTE — Assessment & Plan Note (Signed)
Patient presented as a walk-in today at the cancer center with complaint of chronic headaches, a chronic sore throat, occasional night sweats, and occasional dizziness.  He states that these symptoms have been occurring for at least 3 months.  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  Patient states he is also diabetic; and follows up with his primary care physician on a regular basis.  He denies any recent fevers or chills.  He denies any URI symptoms or UTI symptoms.  Brief exam of the patient revealed.  Patient was nontoxic in neurologically intact.  Patient had been working at his job earlier today.  Advice patient would review all complaints with Dr. Julien Nordmann; and then schedule patient for both labs and a follow-up appointment here at the [facility] on Tuesday, 09/09/2016.  In the meantime-patient was advised to go directly to the emergency department for any worsening symptoms whatsoever.

## 2016-09-08 NOTE — Telephone Encounter (Signed)
Spoke with pt to confirm 10/3 appt per LOS

## 2016-09-08 NOTE — Telephone Encounter (Signed)
Reviewed all of patient's complaints with Dr. Julien Nordmann; and he recommended that patient undergo a restaging CT without contrast of the head and a restaging CT with contrast of the chest, abdomen, and pelvis.  Patient will require labs prior to undergoing his scans.  Patient will then be scheduled for a follow-up visit with Dr. Julien Nordmann to review all results.  All details of this information were called to the patient on his cell phone.  He stated understanding of all instructions; and was in agreement with this plan of care.  Advised patient this provider would keep an eye on his scheduled appointments and call to confirm that he has received all information he needs for his future visits.  Also, patient will require a translator for all of his visits to the [facility].  The cancer Center scheduling team is aware of the need for the translator.

## 2016-09-08 NOTE — Telephone Encounter (Signed)
Pt needs premed for contrast allergy -send to pt pharmacy and notify pt.. Scan scheduled for tomorrow.

## 2016-09-08 NOTE — Assessment & Plan Note (Signed)
Patient is status post R CHOP chemotherapy regimen that was completed in 2010; and then he also underwent 2 years of maintenance Rituxan therapy that was completed in 2012.  He currently is undergoing observation only.  Patient will be scheduled for labs and a follow-up visit here at the [place] on Tuesday, October 13 thousand 17.  He was encouraged to go directed to the emergency department in the meantime with any new or worsening symptoms whatsoever.

## 2016-09-09 ENCOUNTER — Encounter (HOSPITAL_COMMUNITY): Payer: Self-pay

## 2016-09-09 ENCOUNTER — Ambulatory Visit (HOSPITAL_COMMUNITY): Payer: Self-pay

## 2016-09-09 ENCOUNTER — Encounter: Payer: Self-pay | Admitting: Nurse Practitioner

## 2016-09-09 ENCOUNTER — Ambulatory Visit (HOSPITAL_COMMUNITY)
Admission: RE | Admit: 2016-09-09 | Discharge: 2016-09-09 | Disposition: A | Discharge status: Home / Self Care | Payer: Self-pay | Source: Ambulatory Visit | Attending: Nurse Practitioner | Admitting: Nurse Practitioner

## 2016-09-09 ENCOUNTER — Other Ambulatory Visit (HOSPITAL_BASED_OUTPATIENT_CLINIC_OR_DEPARTMENT_OTHER): Payer: Self-pay

## 2016-09-09 DIAGNOSIS — C8200 Follicular lymphoma grade I, unspecified site: Secondary | ICD-10-CM

## 2016-09-09 DIAGNOSIS — R51 Headache: Secondary | ICD-10-CM

## 2016-09-09 DIAGNOSIS — R519 Headache, unspecified: Secondary | ICD-10-CM

## 2016-09-09 DIAGNOSIS — G5600 Carpal tunnel syndrome, unspecified upper limb: Secondary | ICD-10-CM | POA: Insufficient documentation

## 2016-09-09 DIAGNOSIS — Z8572 Personal history of non-Hodgkin lymphomas: Secondary | ICD-10-CM

## 2016-09-09 LAB — COMPREHENSIVE METABOLIC PANEL
ALBUMIN: 3.6 g/dL (ref 3.5–5.0)
ALT: 22 U/L (ref 0–55)
AST: 14 U/L (ref 5–34)
Alkaline Phosphatase: 83 U/L (ref 40–150)
Anion Gap: 10 mEq/L (ref 3–11)
BUN: 18.9 mg/dL (ref 7.0–26.0)
CALCIUM: 9.8 mg/dL (ref 8.4–10.4)
CO2: 20 mEq/L — ABNORMAL LOW (ref 22–29)
Chloride: 102 mEq/L (ref 98–109)
Creatinine: 1 mg/dL (ref 0.7–1.3)
EGFR: 87 mL/min/{1.73_m2} — AB (ref >90)
GLUCOSE: 293 mg/dL — AB (ref 70–140)
Potassium: 5.1 mEq/L (ref 3.5–5.1)
SODIUM: 133 meq/L — AB (ref 136–145)
TOTAL PROTEIN: 6.7 g/dL (ref 6.4–8.3)
Total Bilirubin: 0.4 mg/dL (ref 0.20–1.20)

## 2016-09-09 LAB — CBC & DIFF AND RETIC
BASO%: 0 % (ref 0.0–2.0)
BASOS ABS: 0 10*3/uL (ref 0.0–0.1)
EOS ABS: 0 10*3/uL (ref 0.0–0.5)
EOS%: 0.1 % (ref 0.0–7.0)
HEMATOCRIT: 42.4 % (ref 38.4–49.9)
HEMOGLOBIN: 15.2 g/dL (ref 13.0–17.1)
IMMATURE RETIC FRACT: 1.9 % — AB (ref 3.00–10.60)
LYMPH%: 6.7 % — AB (ref 14.0–49.0)
MCH: 30.5 pg (ref 27.2–33.4)
MCHC: 35.8 g/dL (ref 32.0–36.0)
MCV: 85.1 fL (ref 79.3–98.0)
MONO#: 0.1 10*3/uL (ref 0.1–0.9)
MONO%: 1.2 % (ref 0.0–14.0)
NEUT#: 10.8 10*3/uL — ABNORMAL HIGH (ref 1.5–6.5)
NEUT%: 92 % — AB (ref 39.0–75.0)
PLATELETS: 220 10*3/uL (ref 140–400)
RBC: 4.98 10*6/uL (ref 4.20–5.82)
RDW: 12.6 % (ref 11.0–14.6)
Retic %: 1.82 % — ABNORMAL HIGH (ref 0.80–1.80)
Retic Ct Abs: 90.64 10*3/uL (ref 34.80–93.90)
WBC: 11.7 10*3/uL — ABNORMAL HIGH (ref 4.0–10.3)
lymph#: 0.8 10*3/uL — ABNORMAL LOW (ref 0.9–3.3)

## 2016-09-09 LAB — LACTATE DEHYDROGENASE: LDH: 166 U/L (ref 125–245)

## 2016-09-09 IMAGING — CT CT HEAD W/O CM
3 series · 16 of 47 positions shown, 19 images · non-contrast
Comparison: [DATE] head CT.

CLINICAL DATA: 55-year-old diabetic hypertensive male with
non-Hodgkin's lymphoma diagnosed [DATE]. Chemotherapy
complete. Throat pain and feeling of choking sensation when working.
Occipital headaches. Initial encounter.

EXAM:
CT HEAD WITHOUT CONTRAST
TECHNIQUE: Contiguous axial images were obtained from the base of the skull
through the vertex without intravenous contrast.

[Series 2: headseq 4.8 h45s · axial · 0.43mm/px · z∈[-614,-483]mm · 10 of 33 slices shown, 13 images]
[im 3/33  brain]
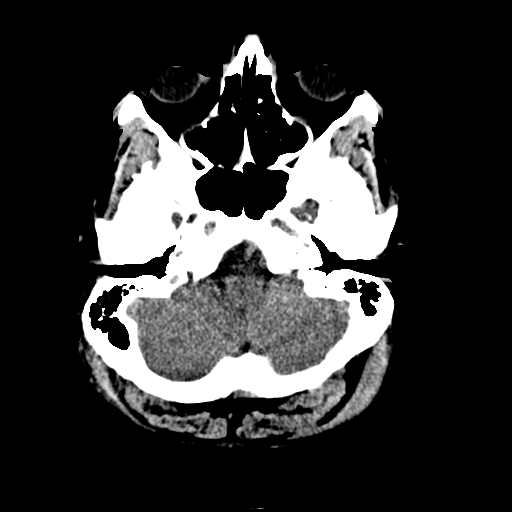
[im 3/33  bone]
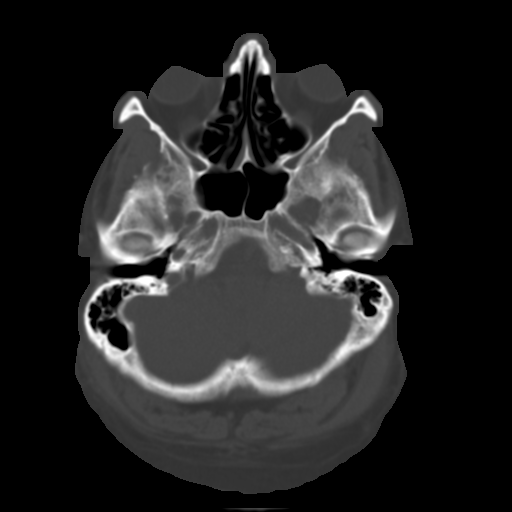
[im 6/33  brain]
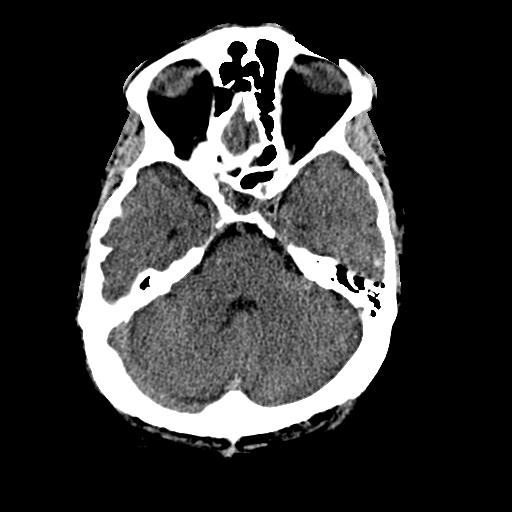
[im 9/33  brain]
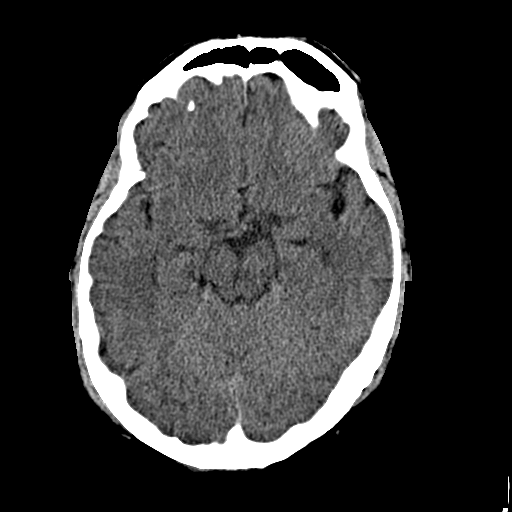
[im 12/33  brain]
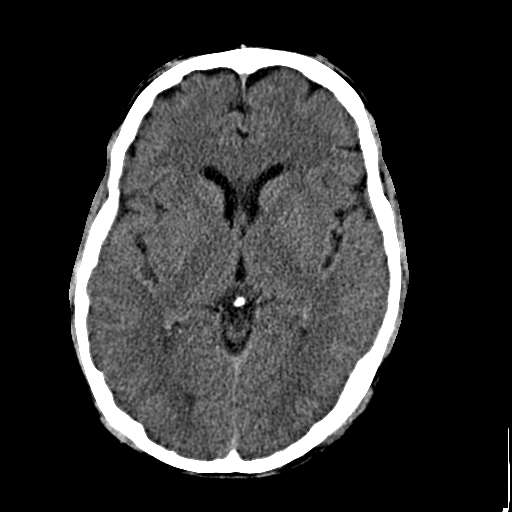
[im 15/33  brain]
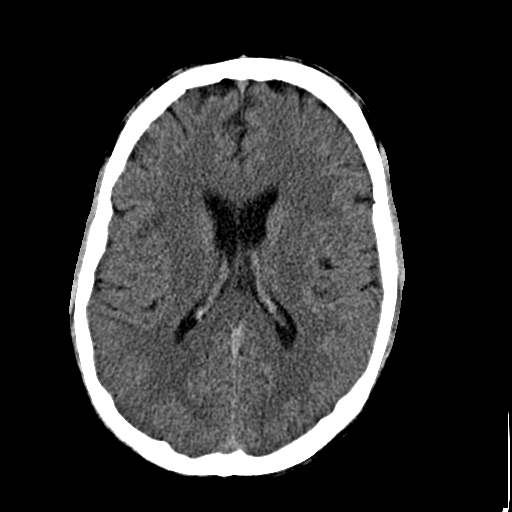
[im 15/33  bone]
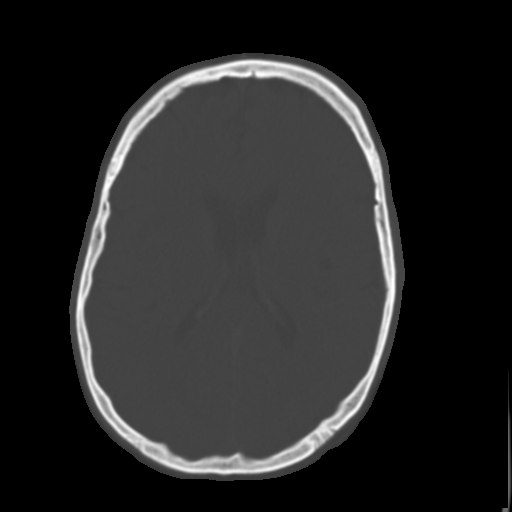
[im 18/33  brain]
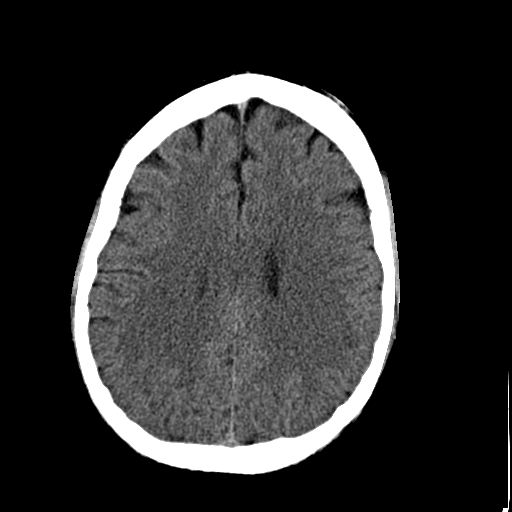
[im 21/33  brain]
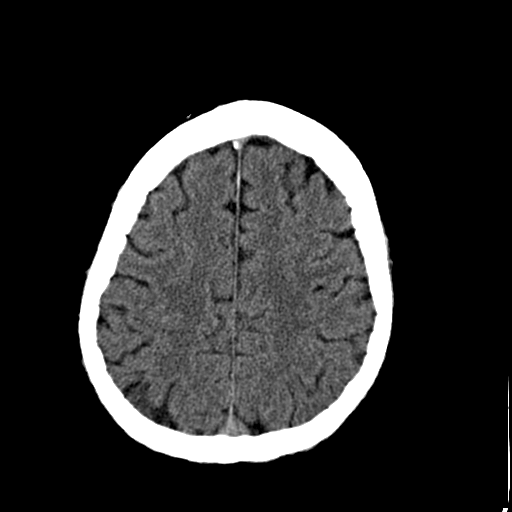
[im 25/33  brain]
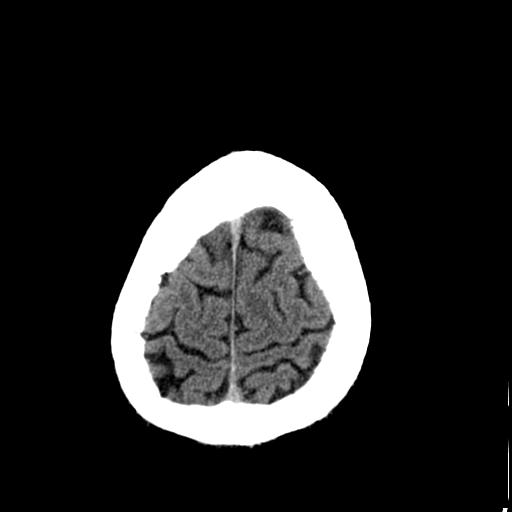
[im 27/33  brain]
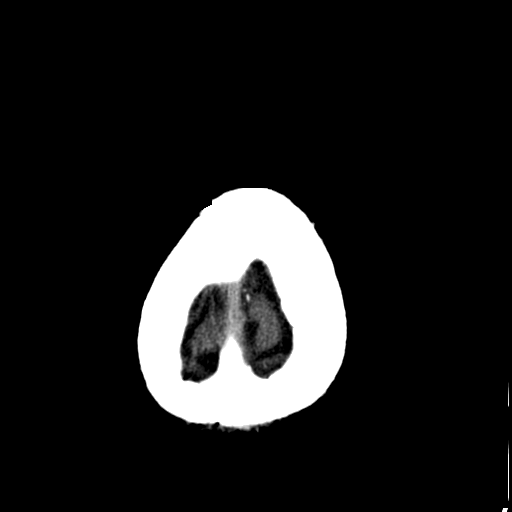
[im 27/33  bone]
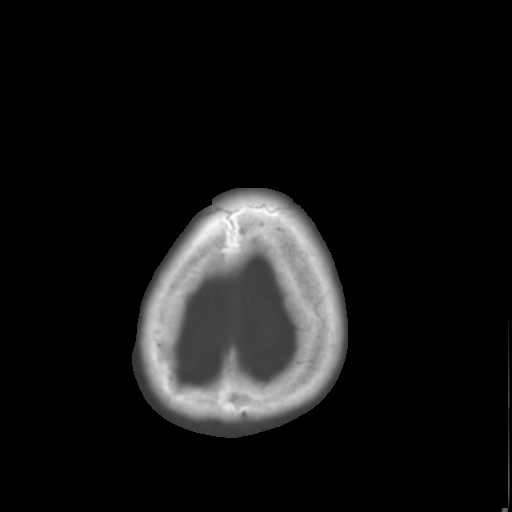
[im 30/33  brain]
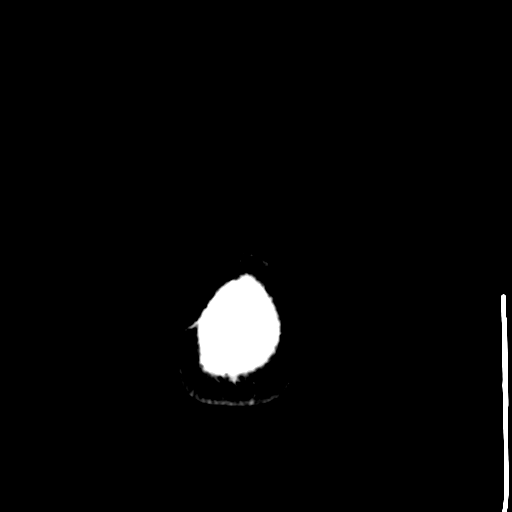

[Series 602: <mpr thick range> · coronal · 0.50mm/px · 3 of 72 slices shown]
[im 24/72  brain]
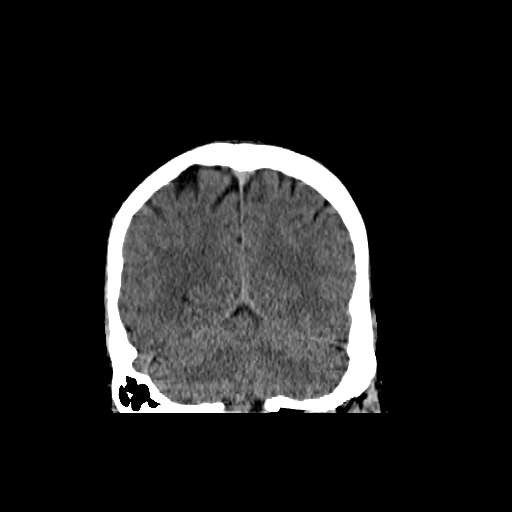
[im 32/72  brain]
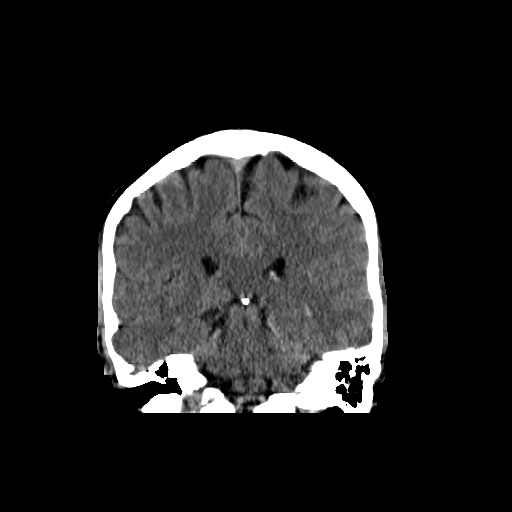
[im 40/72  brain]
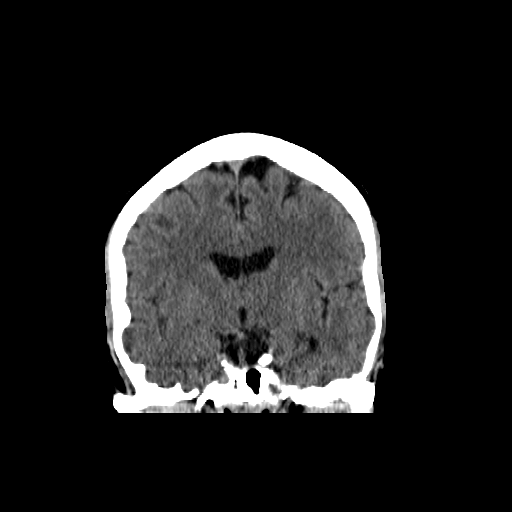

[Series 603: <mpr thick range(1)> · sagittal · 0.50mm/px · 3 of 52 slices shown]
[im 18/52  brain]
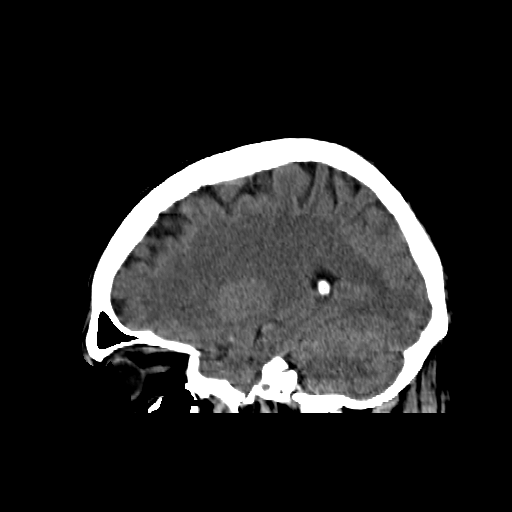
[im 26/52  brain]
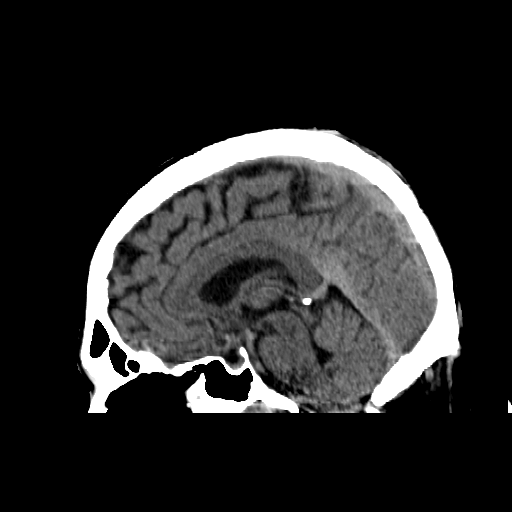
[im 35/52  brain]
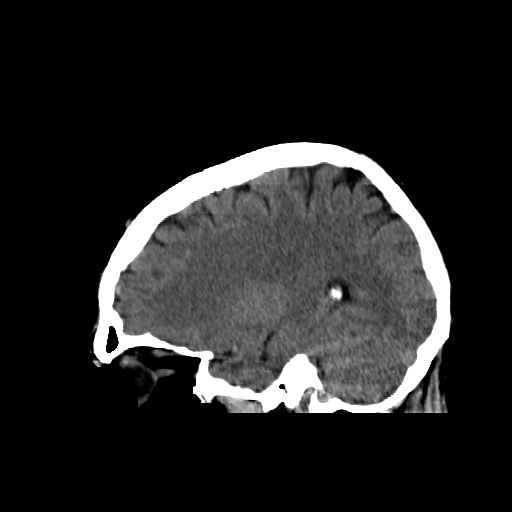

[16 of 47 positions shown; findings below may reference images not displayed]

CTs of the chest, abdomen and
pelvis performed same date are dictated separately.
FINDINGS: Brain: No intracranial hemorrhage or CT evidence of large acute
infarct.

Mild nonspecific white matter changes may reflect result of chronic
microvascular disease and without change.

No advanced atrophy or hydrocephalus.

No intracranial mass lesion noted on this unenhanced exam.

Vascular: No hyperdense vessel. Minimal calcification right internal
carotid artery cavernous segment

Skull: No no destructive lesion.

Sinuses/Orbits: No acute orbital abnormality. Visualized sinuses,
mastoid air cells and middle ear cavities are clear.

Other: Negative
IMPRESSION: No acute intracranial abnormality.  Please see above.

CTs of the chest, abdomen and pelvis performed same date are
dictated separately.

## 2016-09-09 IMAGING — CT CT HEAD W/O CM
3 of 5 series · 16 of 47 positions shown, 19 images · IV contrast (ISOVUE)
Comparison: [DATE] head CT.

CLINICAL DATA: 55-year-old diabetic hypertensive male with
non-Hodgkin's lymphoma diagnosed [DATE]. Chemotherapy
complete. Throat pain and feeling of choking sensation when working.
Occipital headaches. Initial encounter.

EXAM:
CT HEAD WITHOUT CONTRAST
TECHNIQUE: Contiguous axial images were obtained from the base of the skull
through the vertex without intravenous contrast.

[Series 2: cap with st · axial · 0.82mm/px · z∈[-800,-300]mm · 10 of 121 slices shown, 13 images]
[im 11/121  brain]
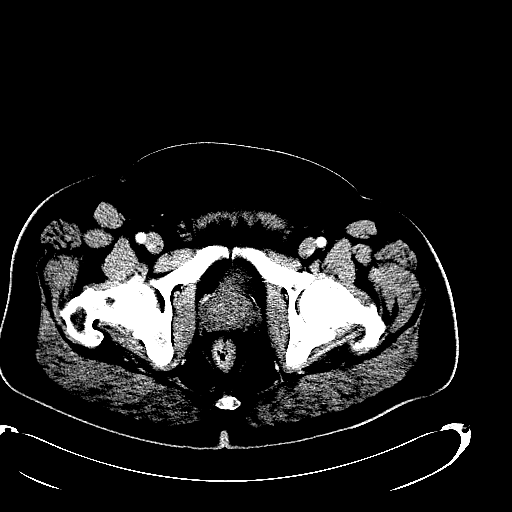
[im 11/121  bone]
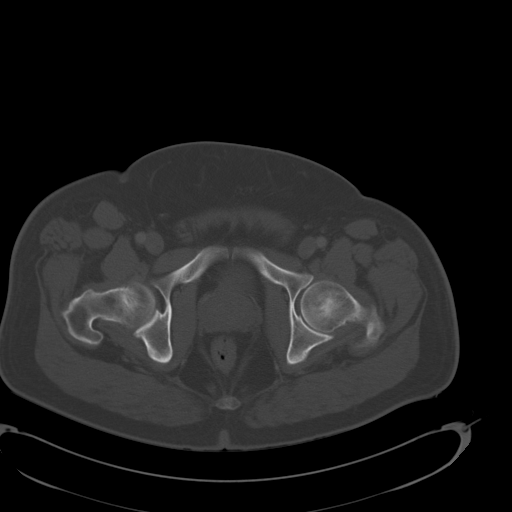
[im 21/121  brain]
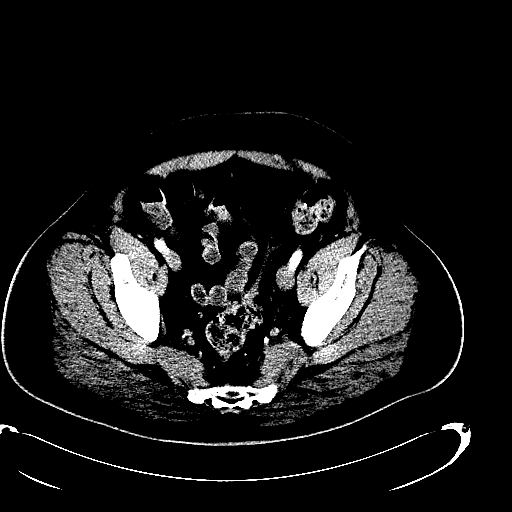
[im 31/121  brain]
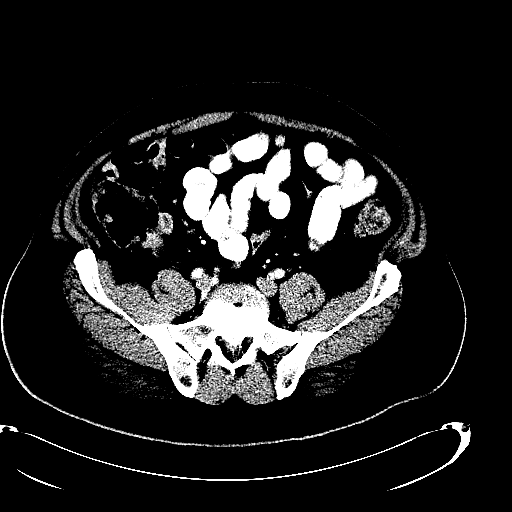
[im 41/121  brain]
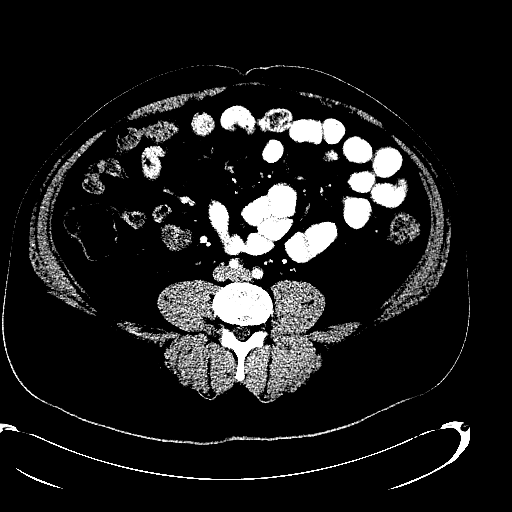
[im 51/121  brain]
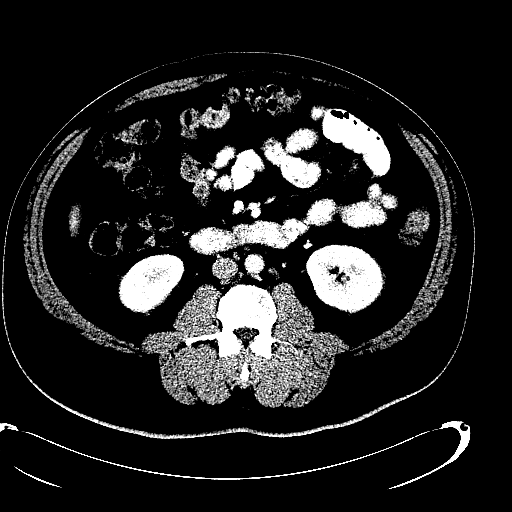
[im 51/121  bone]
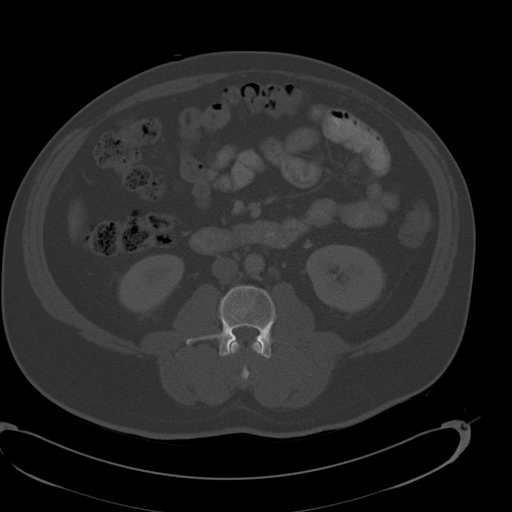
[im 71/121  brain]
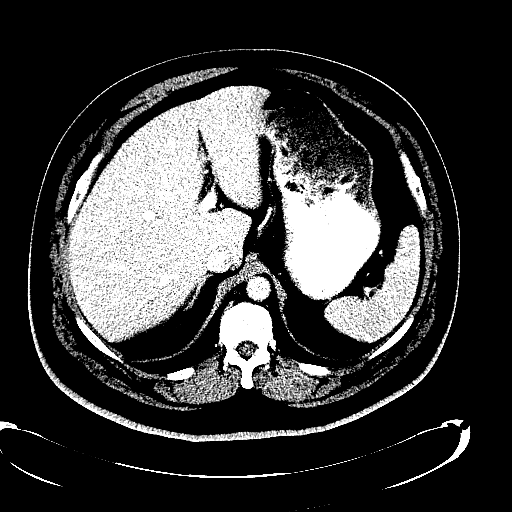
[im 81/121  brain]
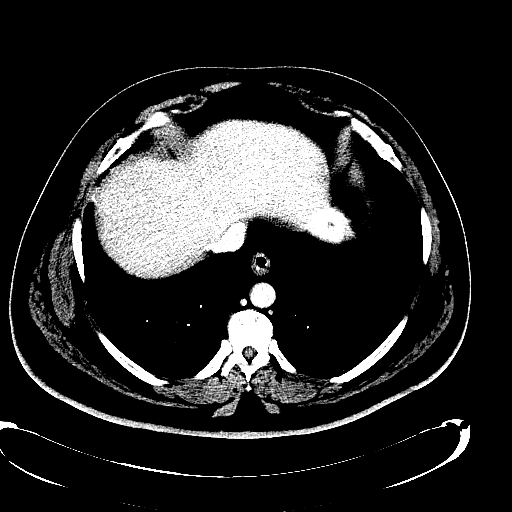
[im 91/121  brain]
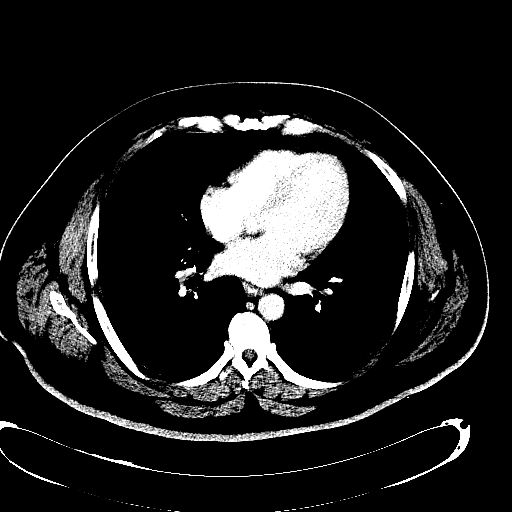
[im 101/121  brain]
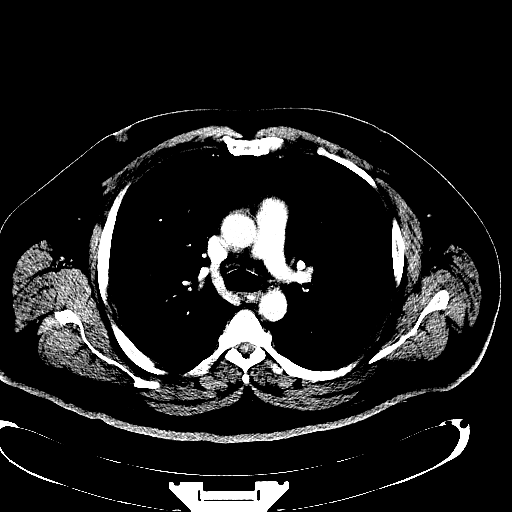
[im 101/121  bone]
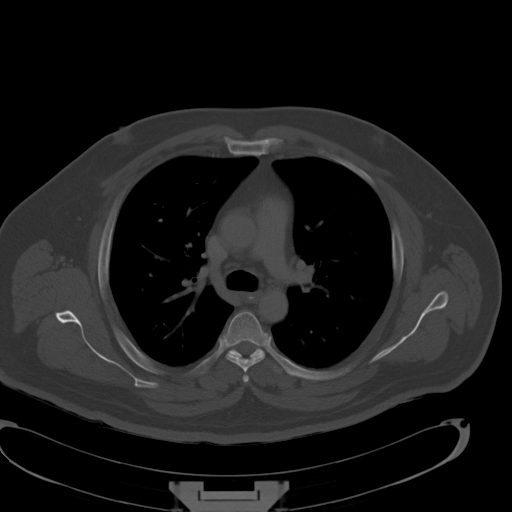
[im 111/121  brain]
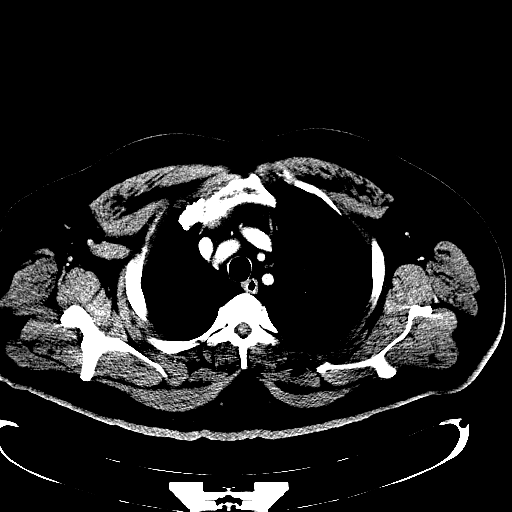

[Series 602: <mpr thick range> · coronal · 1.18mm/px · 3 of 161 slices shown]
[im 54/161  brain]
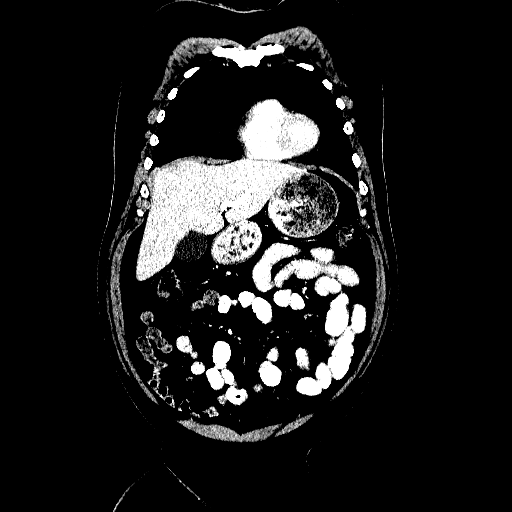
[im 72/161  brain]
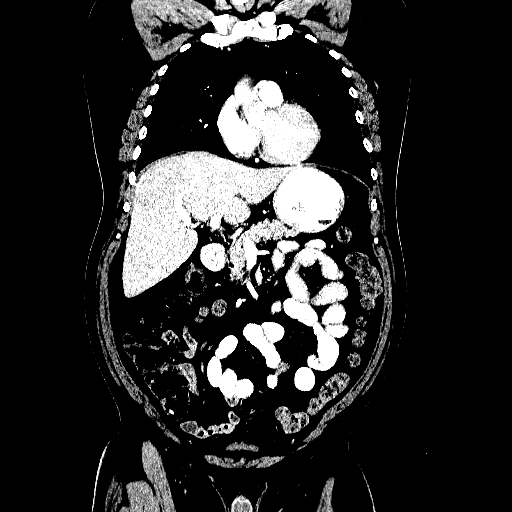
[im 89/161  brain]
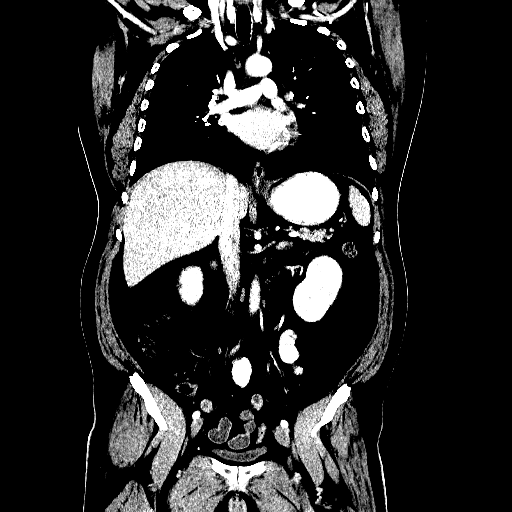

[Series 603: <mpr thick range(1)> · sagittal · 1.18mm/px · 3 of 202 slices shown]
[im 68/202  brain]
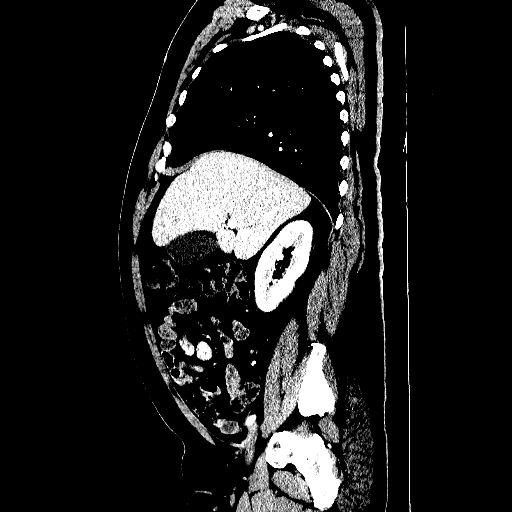
[im 101/202  brain]
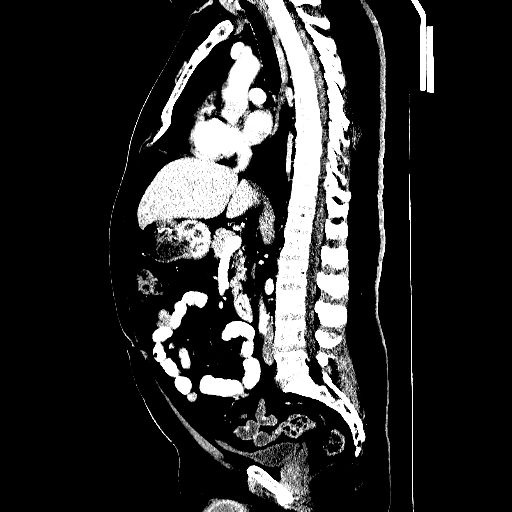
[im 135/202  brain]
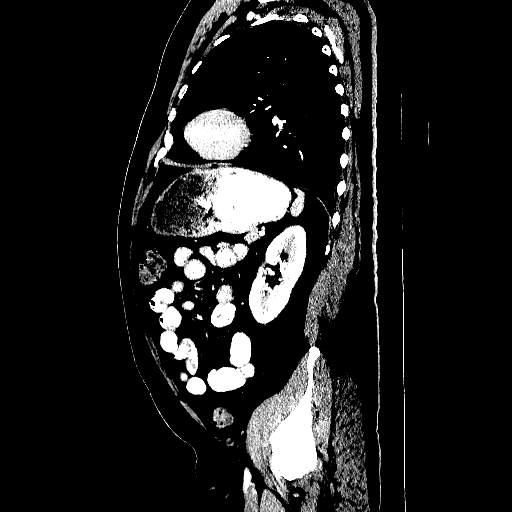

[16 of 47 positions shown; findings below may reference images not displayed]

CTs of the chest, abdomen and
pelvis performed same date are dictated separately.
FINDINGS: Brain: No intracranial hemorrhage or CT evidence of large acute
infarct.

Mild nonspecific white matter changes may reflect result of chronic
microvascular disease and without change.

No advanced atrophy or hydrocephalus.

No intracranial mass lesion noted on this unenhanced exam.

Vascular: No hyperdense vessel. Minimal calcification right internal
carotid artery cavernous segment

Skull: No no destructive lesion.

Sinuses/Orbits: No acute orbital abnormality. Visualized sinuses,
mastoid air cells and middle ear cavities are clear.

Other: Negative
IMPRESSION: No acute intracranial abnormality.  Please see above.

CTs of the chest, abdomen and pelvis performed same date are
dictated separately.

## 2016-09-09 MED ORDER — IOPAMIDOL (ISOVUE-300) INJECTION 61%
100.0000 mL | Freq: Once | INTRAVENOUS | Status: AC | PRN
  Administered 2016-09-09: 100 mL via INTRAVENOUS

## 2016-09-10 ENCOUNTER — Telehealth: Payer: Self-pay | Admitting: Internal Medicine

## 2016-09-10 ENCOUNTER — Ambulatory Visit (HOSPITAL_BASED_OUTPATIENT_CLINIC_OR_DEPARTMENT_OTHER): Payer: Self-pay | Admitting: Internal Medicine

## 2016-09-10 ENCOUNTER — Encounter: Payer: Self-pay | Admitting: Internal Medicine

## 2016-09-10 VITALS — BP 154/72 | HR 98 | Temp 98.2°F | Resp 18 | Ht 65.0 in | Wt 208.4 lb

## 2016-09-10 DIAGNOSIS — J060 Acute laryngopharyngitis: Secondary | ICD-10-CM | POA: Insufficient documentation

## 2016-09-10 DIAGNOSIS — C8203 Follicular lymphoma grade I, intra-abdominal lymph nodes: Secondary | ICD-10-CM

## 2016-09-10 DIAGNOSIS — Z8572 Personal history of non-Hodgkin lymphomas: Secondary | ICD-10-CM

## 2016-09-10 MED ORDER — AMOXICILLIN-POT CLAVULANATE 875-125 MG PO TABS: 1.0000 | ORAL_TABLET | Freq: Two times a day (BID) | ORAL | 0 refills | Status: DC

## 2016-09-10 NOTE — Telephone Encounter (Signed)
No 10/4 los/orders/referrals

## 2016-09-10 NOTE — Progress Notes (Signed)
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  2) Maintenance Rituxan at 375 mg per meter square given every 2 months status post 12 cycles   CURRENT THERAPY: Observation.  INTERVAL HISTORY: Blake Vaughn 55 y.o. male returns to the clinic today for followup visit accompanied by his Spanish interpreter. He was last seen in March 2016 and has been observation for around 7 years now. He requested evaluation because of persistent headache as well as pain in his throat and was worried about recurrence of his disease. He denied having any significant chest pain, shortness of breath, cough or hemoptysis. The patient denied having any significant weight loss or night sweats. He has no palpable lymphadenopathy. He had repeat CT scan of the head, chest, abdomen and pelvis performed recently and he is here today for evaluation and discussion of his scan results.   MEDICAL HISTORY: Past Medical History:  Diagnosis Date  . Diabetes mellitus   . Hypercholesterolemia   . Hypertension 11/27/2011  . Lymphoma [facility])    gets annual chemo last tx Feb 2013  . Lymphoma ([place])     ALLERGIES:  is allergic to iohexol.  MEDICATIONS:  Current Outpatient Prescriptions  Medication Sig Dispense Refill  . cyclobenzaprine (FLEXERIL) 10 MG tablet Take 1 tablet (10 mg total) by mouth 3 (three) times daily as needed for muscle spasms (Headache). 20 tablet 0  . glipiZIDE (GLUCOTROL) 10 MG tablet TAKE ONE TABLET BY MOUTH TWICE DAILY BEFORE A MEAL 60 tablet 3  . glucose blood test strip Use as instructed 100 each 12  . HYDROcodone-acetaminophen (NORCO/VICODIN) 5-325 MG tablet Take 1-2 tablets by mouth every 6 (six) hours as needed.  15 tablet 0  . metFORMIN (GLUCOPHAGE) 1000 MG tablet Take 1 tablet (1,000 mg total) by mouth 2 (two) times daily with a meal. 60 tablet 1  . predniSONE (DELTASONE) 10 MG tablet Take 50 mg (5 tabs) at 13 hours, 7 hours, and 1 hour before contrast media injection. 15 tablet 0  . RELION LANCETS STANDARD 21G MISC Use as instructed 200 each 6  . Alcohol Swabs PADS Use as instructed 100 each 6  . Blood Glucose Monitoring Suppl (RELION PRIME MONITOR) DEVI Use device as instructed 1 Device 0  . diphenhydrAMINE (BENADRYL) 25 MG tablet Take 50 mg (2 tabs) PO 1 hour prior to contrast media. 2 tablet 0  . naproxen (NAPROSYN) 375 MG tablet Take 1 tablet (375 mg total) by mouth 2 (two) times daily. Prn neck and head pain 20 tablet 0  . traMADol (ULTRAM) 50 MG tablet Take 1 tablet (50 mg total) by mouth every 8 (eight) hours as needed for moderate pain. (Patient not taking: Reported on 09/10/2016) 30 tablet 0   No current facility-administered medications for this visit.     REVIEW OF SYSTEMS:  A comprehensive review of systems was negative except for: Constitutional: positive for fatigue Ears, nose, mouth, throat, and face: positive for sore throat Neurological: positive for headaches   PHYSICAL EXAMINATION: General appearance: alert, cooperative and no distress Head: Normocephalic, without obvious abnormality, atraumatic Neck: no adenopathy Lymph nodes: Cervical, supraclavicular, and axillary nodes normal. Resp: clear to auscultation bilaterally Cardio: regular rate and rhythm, S1, S2 normal, no  murmur, click, rub or gallop GI: soft, non-tender; bowel sounds normal; no masses,  no organomegaly Extremities: extremities normal, atraumatic, no cyanosis or edema  ECOG PERFORMANCE STATUS: 1 - Symptomatic but completely ambulatory  Blood pressure (!) 154/72, pulse 98, temperature 98.2 F (36.8 C), temperature source Oral, resp. rate 18, height 5\' 5"  (1.651 m), weight 208 lb 6.4 oz (94.5 kg), SpO2 100  %.  LABORATORY DATA: Lab Results  Component Value Date   WBC 11.7 (H) 09/09/2016   HGB 15.2 09/09/2016   HCT 42.4 09/09/2016   MCV 85.1 09/09/2016   PLT 220 09/09/2016      Chemistry      Component Value Date/Time   NA 133 (L) 09/09/2016 1338   K 5.1 09/09/2016 1338   CL 105 04/02/2016 1229   CL 102 01/26/2013 0805   CO2 20 (L) 09/09/2016 1338   BUN 18.9 09/09/2016 1338   CREATININE 1.0 09/09/2016 1338   GLU 206 (H) 02/06/2009 0836      Component Value Date/Time   CALCIUM 9.8 09/09/2016 1338   ALKPHOS 83 09/09/2016 1338   AST 14 09/09/2016 1338   ALT 22 09/09/2016 1338   BILITOT 0.40 09/09/2016 1338       RADIOGRAPHIC STUDIES: Ct Head Wo Contrast  Result Date: 09/09/2016 CLINICAL DATA:  55 year old diabetic hypertensive male with non-Hodgkin's lymphoma diagnosed December 2009. Chemotherapy complete. Throat pain and feeling of choking sensation when working. Occipital headaches. Initial encounter. EXAM: CT HEAD WITHOUT CONTRAST TECHNIQUE: Contiguous axial images were obtained from the base of the skull through the vertex without intravenous contrast. COMPARISON:  11/04/2015 head CT. CTs of the chest, abdomen and pelvis performed same date are dictated separately. FINDINGS: Brain: No intracranial hemorrhage or CT evidence of large acute infarct. Mild nonspecific white matter changes may reflect result of chronic microvascular disease and without change. No advanced atrophy or hydrocephalus. No intracranial mass lesion noted on this unenhanced exam. Vascular: No hyperdense vessel. Minimal calcification right internal carotid artery cavernous segment Skull: No no destructive lesion. Sinuses/Orbits: No acute orbital abnormality. Visualized sinuses, mastoid air cells and middle ear cavities are clear. Other: Negative IMPRESSION: No acute intracranial abnormality.  Please see above. CTs of the chest, abdomen and pelvis performed same date are dictated separately. Electronically Signed    By: Genia Del M.D.   On: 09/09/2016 15:39   Ct Chest W Contrast  Result Date: 09/09/2016 CLINICAL DATA:  Restaging follicular lymphoma. EXAM: CT CHEST, ABDOMEN, AND PELVIS WITH CONTRAST TECHNIQUE: Multidetector CT imaging of the chest, abdomen and pelvis was performed following the standard protocol during bolus administration of intravenous contrast. CONTRAST:  150mL ISOVUE-300 IOPAMIDOL (ISOVUE-300) INJECTION 61% COMPARISON:  Chest CT 01/31/2015 FINDINGS: CT CHEST FINDINGS Cardiovascular: The heart is normal in size. No pericardial effusion. The aorta and branch vessels are normal. No axillary or supraclavicular lymphadenopathy. The thyroid gland appears normal. Mediastinum/Nodes: Small scattered mediastinal and hilar lymph nodes are stable. There are calcified right-sided mediastinal and hilar lymph nodes consistent with remote granulomas disease. Esophagus is grossly normal. Lungs/Pleura: No acute pulmonary findings. No worrisome pulmonary lesions. Stable calcified granuloma in the right middle lobe. Musculoskeletal: No significant bony findings. CT ABDOMEN PELVIS FINDINGS Hepatobiliary: No focal hepatic lesions or intrahepatic biliary dilatation. The gallbladder is normal. No common bile duct dilatation. Pancreas: No mass, inflammation or ductal dilatation. Spleen: Normal size.  No lesions. Adrenals/Urinary Tract: The adrenal glands and kidneys are normal. Stomach/Bowel: The stomach, duodenum, small bowel and colon are unremarkable. No inflammatory  changes, mass lesions or obstructive findings. The terminal ileum is normal. The appendix is normal. Vascular/Lymphatic: Minimal scattered aortic calcifications. No aneurysm or dissection. The branch vessels are normal. The major venous structures are normal. Retroaortic left renal vein. No mesenteric or retroperitoneal mass or lymphadenopathy. Small scattered retroperitoneal lymph nodes are stable. Reproductive: The prostate gland and seminal vesicles are  normal. Other: No pelvic mass or adenopathy. No free pelvic fluid collections. No inguinal adenopathy. Small periumbilical abdominal wall hernia containing fat. Musculoskeletal: No significant bony findings. IMPRESSION: 1. No CT findings to suggest recurrent lymphoma in the chest, abdomen or pelvis. 2. No acute pulmonary or abdominal findings. 3. Stable changes of remote granulomas disease involving the chest. Electronically Signed   By: Marijo Sanes M.D.   On: 09/09/2016 15:47   Ct Abdomen Pelvis W Contrast  Result Date: 09/09/2016 CLINICAL DATA:  Restaging follicular lymphoma. EXAM: CT CHEST, ABDOMEN, AND PELVIS WITH CONTRAST TECHNIQUE: Multidetector CT imaging of the chest, abdomen and pelvis was performed following the standard protocol during bolus administration of intravenous contrast. CONTRAST:  150mL ISOVUE-300 IOPAMIDOL (ISOVUE-300) INJECTION 61% COMPARISON:  Chest CT 01/31/2015 FINDINGS: CT CHEST FINDINGS Cardiovascular: The heart is normal in size. No pericardial effusion. The aorta and branch vessels are normal. No axillary or supraclavicular lymphadenopathy. The thyroid gland appears normal. Mediastinum/Nodes: Small scattered mediastinal and hilar lymph nodes are stable. There are calcified right-sided mediastinal and hilar lymph nodes consistent with remote granulomas disease. Esophagus is grossly normal. Lungs/Pleura: No acute pulmonary findings. No worrisome pulmonary lesions. Stable calcified granuloma in the right middle lobe. Musculoskeletal: No significant bony findings. CT ABDOMEN PELVIS FINDINGS Hepatobiliary: No focal hepatic lesions or intrahepatic biliary dilatation. The gallbladder is normal. No common bile duct dilatation. Pancreas: No mass, inflammation or ductal dilatation. Spleen: Normal size.  No lesions. Adrenals/Urinary Tract: The adrenal glands and kidneys are normal. Stomach/Bowel: The stomach, duodenum, small bowel and colon are unremarkable. No inflammatory changes, mass  lesions or obstructive findings. The terminal ileum is normal. The appendix is normal. Vascular/Lymphatic: Minimal scattered aortic calcifications. No aneurysm or dissection. The branch vessels are normal. The major venous structures are normal. Retroaortic left renal vein. No mesenteric or retroperitoneal mass or lymphadenopathy. Small scattered retroperitoneal lymph nodes are stable. Reproductive: The prostate gland and seminal vesicles are normal. Other: No pelvic mass or adenopathy. No free pelvic fluid collections. No inguinal adenopathy. Small periumbilical abdominal wall hernia containing fat. Musculoskeletal: No significant bony findings. IMPRESSION: 1. No CT findings to suggest recurrent lymphoma in the chest, abdomen or pelvis. 2. No acute pulmonary or abdominal findings. 3. Stable changes of remote granulomas disease involving the chest. Electronically Signed   By: Marijo Sanes M.D.   On: 09/09/2016 15:47   ASSESSMENT AND PLAN: This is a very pleasant 55 years old Hispanic male with history of follicular lymphoma status post 6 cycles of systemic chemotherapy with CHOP/Rituxan followed by 2 years of maintenance Rituxan. He has been observation for more than 5 years. The recent CT scan of the head, chest, abdomen and pelvis showed no evidence for disease recurrence. I discussed the scan results with the patient today. I recommended for him to continue on observation with follow-up visit in as needed basis.  For the sore throat, I will start the patient Augmentin 875 mg by mouth twice a day for 7 days. For diabetes mellitus and hypertension, he will continue his routine follow-up visit and evaluation by his primary care physician. He was advised to call immediately if  he has any concerning symptoms in the interval.  All questions were answered. The patient knows to call the clinic with any problems, questions or concerns. We can certainly see the patient much sooner if necessary.  Disclaimer:  This note was dictated with voice recognition software. Similar sounding words can inadvertently be transcribed and may not be corrected upon review.

## 2016-09-12 ENCOUNTER — Ambulatory Visit: Payer: Self-pay | Admitting: Nurse Practitioner

## 2016-09-13 ENCOUNTER — Other Ambulatory Visit: Payer: Self-pay | Admitting: Student

## 2016-09-16 ENCOUNTER — Other Ambulatory Visit: Payer: Self-pay | Admitting: Student

## 2016-09-16 MED ORDER — GLIPIZIDE 10 MG PO TABS: ORAL_TABLET | ORAL | 4 refills | Status: DC

## 2016-09-16 MED ORDER — METFORMIN HCL 1000 MG PO TABS: 1000.0000 mg | ORAL_TABLET | Freq: Two times a day (BID) | ORAL | 4 refills | Status: DC

## 2016-09-16 NOTE — Telephone Encounter (Signed)
Patient came by office request refills on his RX Metforman and Glipizide. Please call into [facility] on [address].

## 2017-07-16 ENCOUNTER — Ambulatory Visit (INDEPENDENT_AMBULATORY_CARE_PROVIDER_SITE_OTHER): Payer: Self-pay | Admitting: Family Medicine

## 2017-07-16 ENCOUNTER — Encounter: Payer: Self-pay | Admitting: Family Medicine

## 2017-07-16 VITALS — BP 122/78 | HR 98 | Temp 98.1°F | Ht 65.0 in | Wt 198.0 lb

## 2017-07-16 DIAGNOSIS — G4762 Sleep related leg cramps: Secondary | ICD-10-CM | POA: Insufficient documentation

## 2017-07-16 DIAGNOSIS — IMO0002 Reserved for concepts with insufficient information to code with codable children: Secondary | ICD-10-CM

## 2017-07-16 DIAGNOSIS — E1165 Type 2 diabetes mellitus with hyperglycemia: Secondary | ICD-10-CM

## 2017-07-16 DIAGNOSIS — E118 Type 2 diabetes mellitus with unspecified complications: Secondary | ICD-10-CM

## 2017-07-16 DIAGNOSIS — N529 Male erectile dysfunction, unspecified: Secondary | ICD-10-CM

## 2017-07-16 LAB — POCT GLYCOSYLATED HEMOGLOBIN (HGB A1C): Hemoglobin A1C: 8.9

## 2017-07-16 MED ORDER — METFORMIN HCL 1000 MG PO TABS: 1000.0000 mg | ORAL_TABLET | Freq: Two times a day (BID) | ORAL | 4 refills | Status: DC

## 2017-07-16 MED ORDER — ASPIRIN EC 81 MG PO TBEC: 81.0000 mg | DELAYED_RELEASE_TABLET | Freq: Every day | ORAL | 4 refills | Status: DC

## 2017-07-16 MED ORDER — ATORVASTATIN CALCIUM 10 MG PO TABS: 10.0000 mg | ORAL_TABLET | Freq: Every day | ORAL | 4 refills | Status: DC

## 2017-07-16 MED ORDER — GLIPIZIDE 10 MG PO TABS: ORAL_TABLET | ORAL | 4 refills | Status: DC

## 2017-07-16 NOTE — Assessment & Plan Note (Signed)
Patient's leg cramps could be due to his uncontrolled diabetes causing electrolyte imbalance. Does not seem to be any other possible reasons by history. A1c collected today was 8.9. Diabetes medications were reviewed, but due to resources, patient unable to afford any more at this time. Patient was advised to follow-up with his PCP in 3 months for diabetes management. CMP was also collected today, patient will be called with results if abnormal. Encouraged him to drink more water, replenish his salt during the day, and stay well hydrated. AVS on leg cramps were given to him in [place]. This can be discussed at his next diabetes appointment, or sooner if leg cramps are worsening.

## 2017-07-16 NOTE — Assessment & Plan Note (Signed)
Patient advised to control his sugars better with diet and exercise and continuing his medications.

## 2017-07-16 NOTE — Patient Instructions (Addendum)
Thank you for coming to see me today. It was a pleasure. Today we talked about:   Better control for your Diabetes. I have increased your metformin to 1078m 2 times a day with a meal. I recommend you follow-up here in 3 months to re-check your blood sugar to make sure this is working.  Leg cramping could be related to your uncontrolled sugar, as well as sweating excessively at work. I recommend drinking water with salt, such as Power-ade Zero while you are at work.  I have started your on a baby aspirin everyday (836m and a statin for your heart.   Please follow-up with Blake Vaughn in 3 months.   If you have any questions or concerns, please do not hesitate to call the office at ([phone] Take Care,   JoMartiniquehirley, DO   Control del nivel sanguneo de glucosa en los adultos (Blood Glucose Monitoring, Adult) El control del nivel de azcar (glucosa) en la sangre lo ayuda a tener la diabetes bajo control. Tambin ayuda a que usted y el mdico controlen si el tratamiento de la diabetes es efArmed forces logistics/support/administrative officerEl control del nivel sanguneo de glucosa implica realizar controles regulares como lo indique el mdico y llCatering manageregistro de los resultados (registro diario). POR QU DEBO COKettleman CityE GLUCOSA? Si controla su nivel sanguneo de glucosa con regularidad, podr:  Comprender de quPeabody Energyla actividad fsica, las enfermedades y los medicamentos inciden en los niveles sanguneos de glYoakum Conocer el nivel sanguneo de glucosa en cualquier momento dado. Saber rpidamente si el nivel es bajo (hipoglucemia) o alto (hiperglucemia).  Puede ser de ayuda para que usted y el mdico sepan cmo ajustar los medicamentos. CUAftonE GLUCOSA? Siga las indicaciones del mdico acerca de la frecuencia con la que debe controlar el nivel sanguneo de glucosa. La frecuencia puede depender de:  El tipo de diabetes que tenga.  Si su  diabetes est bajo control.  Los medicamentos que toma. Si usted tiene diabetes tipo 1:  Controle su nivel sanguneo de glucosa al meHalliburton Companyl da.  Tambin controle su nivel sanguneo de glucosa: ? Antes de cada inyeccin de insulina. ? Antes y despus de hacer ejercicio. ? EnMcCordomidas. ? Dos horas despus de una comida. ? Ocasionalmente, entre las 2:00a.m. y las 3:00a.m., como se lo hayan indicado. ? Antes de reOptometristareas peligrosas, coEnglish as a second language teacher usar maquinaria pesada. ? A la hora de acostarse.  Es posible que deGeophysicist/field seismologiston ms frecuencia los niveles sanguneos de glMoranhaYznaga a 10veces por da: ? Si usCanadana bomba de inSweetwater? Si necesita varias inyecciones diarias. ? Si su diabetes no est bien controlada. ? Si est enfermo. ? Si tiene antecedentes de hipoglucemia grave. ? Si tiene antecedentes de no darse cuenta cundo est bajando su nivel sanguneo de glucosa (hipoglucemia asintomtica). Si usted tiene diabetes tipo 2:  Si recibe insulina u otro medicamento para la diabetes, controle el nivel sanguneo de glucosa al meToysRuseces al da.  Mientras reciba tratamiento intensivo con insulina, debe medirse el nivel sanguneo de glucosa al menos 4veces al daSunTrustOcasionalmente, es posible que deba controlarse entre las 2:00a.m. y las 3:00a.m., segn se lo indiquen.  Tambin controle su nivel sanguneo de glucosa: ? Antes y despus de hacer ejercicio. ? Antes de reOptometristareas peligrosas, coEnglish as a second language teacher usar maquinaria pesada.  Es posible que deba controlar con ms frecuencia  los niveles sanguneos de glucosa si: ? Es necesario ajustar la dosis de sus medicamentos. ? Su diabetes no est bien controlada. ? Est enfermo. QU ES UN REGISTRO [place] DE GLUCOSA?  Un registro diario es un registro de los valores de glucosa en la [place]. Puede ayudarles a usted y a su mdico a: ? Health visitor en su nivel sanguneo de  glucosa durante el transcurso del [place]. ? Ajustar su plan de control de la diabetes como sea necesario.  Cada vez que controle su nivel sanguneo de glucosa, anote el resultado y aquellos factores que pueden estar afectando su nivel sanguneo de glucosa, como la dieta y la actividad fsica Designer, multimedia.  La [employer] de los medidores de glucosa guardan un registro de las lecturas realizadas con el medidor. Algunos permiten descargar sus registros en una computadora.  [place] DE GLUCOSA? Siga los siguientes pasos para obtener lecturas precisas de su glucemia: Materiales necesarios  Medidor de Printmaker.  Tiras reactivas para el medidor. Cada medidor tiene sus propias tiras reactivas. Debe usar las tiras reactivas que trae su medidor.  Una aguja para pincharse el dedo (lanceta). No utilice la misma lanceta en ms de una ocasin.  Un dispositivo que sujeta la lanceta (dispositivo de puncin).  Un diario o libro de anotaciones para [employer]. Procedimiento  [employer] con agua y [place].  Pnchese el costado del dedo (no la punta) con Retail buyer. Use un dedo diferente cada vez.  Frote suavemente el dedo [employer] aparezca una pequea gota de [place].  Siga las instrucciones que vienen con el medidor para Garment/textile technologist tira Comptroller, Midwife la sangre sobre la tira y usar el medidor de Printmaker.  Registre el resultado y las observaciones que desee. Zonas del cuerpo alternativas para realizar las pruebas  Algunos medidores le permiten tomar sangre para la prueba de otras zonas del cuerpo que no son el dedo (zonas alternativas).  Si cree que tiene hipoglucemia o si tiene hipoglucemia asintomtica, no utilice las zonas alternativas del cuerpo. En su lugar, use los dedos.  Es posible que las zonas alternativas no sean tan precisas como los dedos porque el flujo de sangre es ms lento en esas zonas. Esto significa que  el resultado que obtiene de estas zonas puede estar retrasado y ser un poco diferente del resultado que obtendra del dedo.  Los sitios alternativos ms comunes son los siguientes: ? Los antebrazos. ? Los muslos. ? La palma de [employer]. Consejos adicionales  Siempre tenga los insumos a mano.  Todos los medidores de glucosa incluyen un nmero de telfono "directo", disponible las 24 horas, al que podr llamar si tiene preguntas o [place]. Tambin puede consultar a su mdico.  Despus de usar algunas cajas de tiras reactivas, ajuste (calibre) el medidor de glucemia segn las instrucciones del medidor. Esta informacin no tiene Marine scientist el consejo del mdico. Asegrese de hacerle al mdico cualquier pregunta que tenga. Document Released: 11/24/2005 Document Revised: 03/17/2016 Document Reviewed: 05/05/2016 Elsevier Interactive Patient Education  2017 [place] en las piernas (Leg Cramps) Los calambres en las piernas se producen cuando un msculo o varios msculos se tensionan, y usted no lo puede Chief Technology Officer (Human resources officer involuntaria). Los calambres musculares pueden aparecer en cualquier msculo, pero el lugar ms frecuente es en los msculos de la pantorrilla. Estos calambres se producen durante el ejercicio o el  reposo. Los Monsanto Company piernas son dolorosos y pueden durar de unos segundos a unos minutos. Es posible que aparezcan varias veces antes de detenerse finalmente. Por lo general, la causa de los Monsanto Company piernas no es un problema mdico grave. En muchos de Southern Company, se desconoce la causa. Algunas causas comunes son las siguientes:  Immunologist.  Hacer movimientos repetitivos de forma excesiva o hacer lo mismo una y [place].  Permanecer en determinada posicin durante un perodo prolongado.  Tener una preparacin, una tcnica o un mtodo inadecuado al Optometrist un deporte o [place].  Deshidratacin.  Lesiones.  Efectos secundarios de algunos medicamentos.  Pacheco sales y los iones de la sangre (electrolitos), en especial, de potasio y calcio. Estos niveles podran estar bajos si usted toma diurticos o est embarazada. INSTRUCCIONES PARA EL CUIDADO EN EL HOGAR Controle su afeccin para ver si hay cambios. Las siguientes medidas pueden servir para Public house manager cualquier molestia que sienta:  [place] buena hidratacin. Beba suficiente lquido para Consulting civil engineer orina clara o de color amarillo plido.  Intente masajear, estirar y Surveyor, mining msculo afectado. Hgalo durante varios minutos.  Si tiene los A[employer]ren Corporation o tirantes, coloque el rea afectada debajo del agua caliente de la ducha o aplquese una toalla tibia o una almohadilla trmica.  Si despus de un calambre tiene dolor o molestias, puede sentir alivio al aplicar hielo en el rea afectada. ? Ponga el hielo en una bolsa plstica. ? Coloque una [employer] piel y la bolsa de hielo. ? Coloque el hielo durante 47mnutos, 2 a 3veces por da.  Evite los ejercicios intensos dPittsburgsi est teniendo calambres en las piernas con frecuencia.  Asegrese de que su dieta incluya los minerales esenciales para que los msculos funcionen con normalidad.  Tome los medicamentos solamente como se lo haya indicado el mdico. SOLICITE ATENCIN MDICA SI:  Los calambres en las piernas se vuelven ms intensos o ms frecuentes, o no mejoran con el tiempo.  Tiene el pie fro, adormecido o de cYUM! Brands Esta informacin no tiene cMarine scientistel consejo del mdico. Asegrese de hacerle al mdico cualquier pregunta que tenga. Document Released: 11/24/2005 Document Revised: 04/10/2015 Document Reviewed: 11/01/2014 Elsevier Interactive Patient Education  2018 EBollingerdiabetes mellitus y las normas bsicas de atencin mdica Diabetes Mellitus and Standards of Medical  Care Tratar la diabetes (diabetes mellitus) puede ser complicado. Su tratamiento de la diabetes puede ser administrado por un equipo de profesionales de la salud, que incluye:  Un especialista en dietas y nutricin (nutricionista registrado).  Un enfermero.  Un educador certificado para el cuidado de la diabetes.  Un especialista en diabetes (endocrinlogo).  Un oculista.  Un mdico de cabecera.  Un dentista.  Sus mdicos siguen un programa para ayudarle a oEngineer, productioncalidad en atencin. El programa siguiente es una gua general para su plan de control de la diabetes. Sus mdicos tambin podrn darle instrucciones ms especficas. Anlisis de HbA1c ( hemoglobina A1c) Este anlisis proporciona informacin sobre el control de la glucemia (glucosa en la sangre) durante los ltimos 2 o 323mes. Se utiliza para verificar si el plan de control de la diabetes debe ser modificado.  Si cumple los objetivos del trForestoneste anlisis se realiza al meHalliburton Companyl ao.  Si no cumple los objetivos del tratamiento o si sus objetivos han caNew Miami Colonyeste anlisis se realiza cuatro veces al  ao.  Control de la presin arterial  Este control se realiza en cada visita mdica de rutina. Para la [employer], la meta es menos que 130/80. Consltele a su mdico cul es su meta para la presin arterial. Exmenes dentales y oculares  Visite al [employer] por ao.  Si tiene diabetes tipo1, hgase un examen ocular en el trmino de 3 a 5aos despus del diagnstico y, luego, Ardelia Mems vez al ao despus del Tree surgeon. ? Si le diagnosticaron diabetes tipo 1 siendo un nio, debe hacerse estudios al llegar a los 10 aos o ms y si ha tenido diabetes durante un perodo de 3 a 5 aos. Despus del primer examen, debe hacerse un examen ocular todos los aos.  Si tiene diabetes tipo2, hgase un examen ocular tan pronto como le diagnostiquen la enfermedad y, [place], una vez por ao  despus del Tree surgeon. Examen de los pies  Una inspeccin visual de pies se hace en cada visita mdica de rutina. En este examen se buscan cortes, moretones, enrojecimiento, ampollas, llagas u otros problemas en los pies.  Su mdico le Chartered certified accountant examen de pies completo una vez por ao. Este examen incluye revisar la estructura y la piel de los pies, y examinar los pulsos y la sensacin de los pies. ? Diabetes tipo1: Hgase su primer examen en el trmino de 3 a 5 aos despus del diagnstico. ? Diabetes tipo2: Hgase su primer examen tan pronto como le diagnostiquen la enfermedad.  Examnese a diario los pies en busca de cortes, moretones, enrojecimiento, ampollas o llagas. Si tiene alguno de [place] u otros problemas y no se curan, pngase en contacto con su mdico. Estudio de la funcin renal ( microalbmina en orina)  Este estudio se realiza una vez por ao. ? Diabetes tipo1: Hgase su primer estudio cinco aos despus del diagnstico. ? Diabetes tipo2: Hgase su primer estudio tan pronto como le diagnostiquen la enfermedad.  Si tiene enfermedad renal crnica, hgase un examen de creatinina srica y de ndice de filtracin glomerular estimada (eGFR, por sus siglas en ingls) una vez por ao. Perfil lipdico (colesterol, HDL, LDL, triglicridos)  Este examen se debe realizar cuando le diagnostiquen diabetes y cada cinco aos luego del Tree surgeon. Si est tomando medicamentos para bajar su colesterol, es posible que se deba realizar este examen cada ao. ? En relacin al LDL, el objetivo es tener menos de 186m/dl (5,5 mmol/l). Si tiene aUnited Parcel el objetivo es tener menos de 70 mg/dl (3,9 mmol/l). ? En relacin al HDL, el objetivo es tener 40 mg/dl (2,2 mmol/l) para los hombres y 50 mg/dl (2,8 mmol/l) para las mujeres. Un nivel de colesterol HDL de 60 mg/dl (3,3 mmol/l) o superior da una cierta proteccin contra la enfermedad cardaca. ? En relacin a los triglicridos, el  objetivo es tener menos de 150 mg/dl (8,3 mmol/l). Vacunas  Se recomienda aplicar de forma anual la vacuna contra la gripe (influenza) a todas las personas de 612mes en adelante que tengan diabetes.  La vacuna contra la neumona (antineumoccica) est recomendada para todas las personas de 2aos en adelante que tengan diabetes. Si tiene 65aos o ms, puede recibir laEngineer, manufacturingomo una serie de dos inyecciones diByersville Se recomienda administrar la vacuna contra la hepatitisB en adultos poco despus de que hayan recibido el diagnstico de diabetes.  La vacuna Tdap (contra el ttanos, la difteria y la tosferina) debe aplicarse de la siguiente manera: ? Segn las pautas  normales de vacunacin infantil en el caso de los nios. ? Cada 10aos en el caso de los adultos con diabetes.  La vacuna contra la culebrilla (herpes zster) se recomienda en personas que han tenido varicela y que tiene 64 aos de edad o ms. Salud mental y emocional  Se recomienda Optometrist controles para Hydrographic surveyor sntomas de trastornos de Youth worker, ansiedad y depresin en el momento del diagnstico, y en una etapa posterior segn sea necesario. Si los controles revelan la presencia de sntomas (el resultado de los controles es positivo), es posible que deba someterse a evaluaciones posteriores y lo deriven a un mdico Regulatory affairs officer. Educacin para el autocontrol de la diabetes  Es recomendable que se informe sobre cmo controlar su diabetes en el momento de recibir el diagnstico y [place], segn sea necesario. Plan de tratamiento  Su plan de tratamiento ser revisado en cada visita mdica. Resumen  Tratar la diabetes (diabetes mellitus) puede ser complicado. Su tratamiento de la diabetes puede ser administrado por un equipo de profesionales de Technical sales engineer.  Sus mdicos siguen un programa para ayudarle a Engineer, production calidad en atencin.  Las normas asistenciales incluyen  realizarse, regularmente, exmenes fsicos, anlisis de sangre y controles de la presin arterial, aplicarse vacunas, someterse a estudios de control e informarse sobre cmo controlar su diabetes.  Sus mdicos tambin podrn darle instrucciones ms especficas basndose en su salud. Esta informacin no tiene Marine scientist el consejo del mdico. Asegrese de hacerle al mdico cualquier pregunta que tenga. Document Released: 02/18/2010 Document Revised: 11/05/2016 Document Reviewed: 04/26/2013 Elsevier Interactive Patient Education  [employer].

## 2017-07-16 NOTE — Assessment & Plan Note (Signed)
Patient has been taking his medications twice a day, regularly, leg cramps have been occurring for at least 4 weeks. Possibly could be due to to elevated glucose/electrolyte imbalance. Patient was lost to follow-up. Refilled metformin and glipizide today. A1c collected today 8.9. Patient will be advised to follow-up with his PCP within 3 months to discuss diabetes management, as he may be an insulin candidate. Encouraged patient that he will need to follow-up every 3 months for his diabetes. Patient also started on atorvastatin and aspirin 81 mg daily.

## 2017-07-16 NOTE — Progress Notes (Signed)
Subjective:    Patient ID: Blake Vaughn, male    DOB: Sep 28, 1961, 56 y.o.   MRN: [id number]   CC: Leg Cramps  HPI: Spanish speaking phone interpreter  Name: Blake Vaughn #[id number]  Leg cramps:  Patient presents with a 4 week history of bilateral nocturnal leg cramps. Patient denies any increase or change to activity, he is on his feet the majority of the day walking. He denies any changes to his diet, and does not eat a high salt diet. His diet consists mostly fresh fruit and no added salt.  Patient is a diabetic type II, uncontrolled. He is on glipizide and metformin for his diabetes, but was lost to follow-up and needed to return to clinic in order to have future refills.  Uncontrolled Diabetes Mellitus, Type 2:  Patient states he is unsure of how his diabetes is doing; does not monitor blood sugars. His last check was 2 months ago in [place] and it was 118, so he thinks it is good. Last A1c was 8.7 on 04/02/2016. Today it 8.9. Patient denies any hyper or hypoglycemic events. He denies any nonhealing wounds. 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 He does not check his blood sugars. He does not have glucometer to measure sugars, has been trying to eat healthy by cutting tortillas out. He says he knows what foods are bad, but he does not want to stop eating them. He has not followed up with provider for diabetes in a while. He takes his metformin and glyburide everyday, twice a day. He is unwilling to meet with a diabetes educator regarding his diet and treatment.   Erectile Dysfunction: At the end of the visit patient asked, after the video interpreter had been turned off and we were walking out of the room if he could have something to help him with sex. I explained it would be expensive and he declined. I encouraged him to have good control of his sugars and this would help him greatly.  Smoking status  reviewed  Review of Systems  Per HPI, else denies recent illness, fever, headache, changes in vision, chest pain, shortness of breath, abdominal pain, N/V/D   Patient Active Problem List   Diagnosis Date Noted  . Nocturnal leg cramps 07/16/2017  . FOOT PAIN, BILATERAL 11/05/2009  . Pure hypercholesterolemia 09/24/2009  . 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 TOBACCO USE, QUIT 08/18/2009  . Diabetes type 2, uncontrolled ([place]) 07/09/2009  . 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 2+ radial and PT pulses bilaterally Respiratory: CTAB, normal effort Abdomen: soft, nontender, nondistended, no hepatic or splenomegaly. Bowel sounds present Extremities: no edema or cyanosis. WWP. Skin: warm and dry, no rashes noted Neuro: alert and oriented, no focal deficits FEET: No lesions.  No maceration of skin between toes.  Palpable dp pulses, sensation is full in all areas of foot with monofilament testing. Right third toe with bruising unde nail patient states he dropped something on it at work recently and it is getting better.  Assessment & Plan:  Nocturnal leg cramps Patient's leg cramps  could be due to his uncontrolled diabetes causing electrolyte imbalance. Does not seem to be any other possible reasons by history. A1c collected today was 8.9. Diabetes medications were reviewed, but due to resources, patient unable to afford any more at this time. Patient was advised to follow-up with his PCP in 3 months for diabetes management. CMP was also collected today, patient will be called with results if abnormal. Encouraged him to drink more water, replenish his salt during the day, and stay well hydrated. AVS on leg cramps were given to  him in [place]. This can be discussed at his next diabetes appointment, or sooner if leg cramps are worsening.  Diabetes type 2, uncontrolled Patient has been taking his medications twice a day, regularly, leg cramps have been occurring for at least 4 weeks. Possibly could be due to to elevated glucose/electrolyte imbalance. Patient was lost to follow-up. Refilled metformin and glipizide today. A1c collected today 8.9. Patient will be advised to follow-up with his PCP within 3 months to discuss diabetes management, as he may be an insulin candidate. Encouraged patient that he will need to follow-up every 3 months for his diabetes. Patient also started on atorvastatin and aspirin 81 mg daily.   ERECTILE DYSFUNCTION, ORGANIC Patient advised to control his sugars better with diet and exercise and continuing his medications.     [name] Tyrica Afzal, DO Family Medicine Resident PGY-1

## 2017-07-17 LAB — CMP14+EGFR
ALT: 16 IU/L (ref 0–44)
AST: 15 IU/L (ref 0–40)
Albumin/Globulin Ratio: 2.1 (ref 1.2–2.2)
Albumin: 4 g/dL (ref 3.5–5.5)
Alkaline Phosphatase: 83 IU/L (ref 39–117)
BILIRUBIN TOTAL: 0.3 mg/dL (ref 0.0–1.2)
BUN/Creatinine Ratio: 17 (ref 9–20)
BUN: 14 mg/dL (ref 6–24)
CALCIUM: 9.4 mg/dL (ref 8.7–10.2)
CO2: 22 mmol/L (ref 20–29)
Chloride: 99 mmol/L (ref 96–106)
Creatinine, Ser: 0.82 mg/dL (ref 0.76–1.27)
GFR, EST AFRICAN AMERICAN: 114 mL/min/{1.73_m2} (ref >59)
GFR, EST NON AFRICAN AMERICAN: 99 mL/min/{1.73_m2} (ref >59)
GLUCOSE: 232 mg/dL — AB (ref 65–99)
Globulin, Total: 1.9 g/dL (ref 1.5–4.5)
Potassium: 4.7 mmol/L (ref 3.5–5.2)
Sodium: 137 mmol/L (ref 134–144)
TOTAL PROTEIN: 5.9 g/dL — AB (ref 6.0–8.5)

## 2017-07-17 LAB — CBC
HEMATOCRIT: 40.8 % (ref 37.5–51.0)
HEMOGLOBIN: 13.6 g/dL (ref 13.0–17.7)
MCH: 29.4 pg (ref 26.6–33.0)
MCHC: 33.3 g/dL (ref 31.5–35.7)
MCV: 88 fL (ref 79–97)
Platelets: 178 10*3/uL (ref 150–379)
RBC: 4.63 x10E6/uL (ref 4.14–5.80)
RDW: 14.1 % (ref 12.3–15.4)
WBC: 8.7 10*3/uL (ref 3.4–10.8)

## 2017-07-17 LAB — MAGNESIUM: Magnesium: 2.1 mg/dL (ref 1.6–2.3)

## 2018-06-04 ENCOUNTER — Other Ambulatory Visit: Payer: Self-pay | Admitting: Family Medicine

## 2018-10-10 ENCOUNTER — Other Ambulatory Visit: Payer: Self-pay

## 2018-10-10 ENCOUNTER — Encounter (HOSPITAL_COMMUNITY): Payer: Self-pay | Admitting: Emergency Medicine

## 2018-10-10 ENCOUNTER — Ambulatory Visit (HOSPITAL_COMMUNITY)
Admission: EM | Admit: 2018-10-10 | Discharge: 2018-10-10 | Disposition: A | Discharge status: Home / Self Care | Payer: Self-pay | Attending: Family Medicine | Admitting: Family Medicine

## 2018-10-10 DIAGNOSIS — G8929 Other chronic pain: Secondary | ICD-10-CM | POA: Diagnosis not present

## 2018-10-10 DIAGNOSIS — M25511 Pain in right shoulder: Secondary | ICD-10-CM | POA: Diagnosis not present

## 2018-10-10 MED ORDER — NAPROXEN 500 MG PO TABS: 500.0000 mg | ORAL_TABLET | Freq: Two times a day (BID) | ORAL | 1 refills | Status: DC

## 2018-10-10 NOTE — Discharge Instructions (Signed)
Please try the exercises  Please try to put heat or ice on the shoulder  Please take the naproxen for 10 days straight and then as needed  Please follow up if your pain doesn't improve.

## 2018-10-10 NOTE — ED Provider Notes (Signed)
Blake Vaughn    CSN: [id number] Arrival date & time: 10/10/18  1146     History   Chief Complaint Chief Complaint  Patient presents with  . Shoulder Pain    HPI Blake Vaughn is a 57 y.o. male.   He is presenting with bilateral shoulder pain.  The right is worse than the left.  The pain is localized to the shoulder itself.  He denies any radicular symptoms.  Symptoms seem to be worse after working.  He works as a Games developer.  He has not done anything for the pain.  Denies any mechanism of injury.  Denies any inciting event.  The pain is on the lateral aspect of each shoulder.  He reports the symptoms being acute on chronic in nature.  HPI  Past Medical History:  Diagnosis Date  . Diabetes mellitus   . Hypercholesterolemia   . Hypertension 11/27/2011  . Lymphoma [facility])    gets annual chemo last tx Feb 2013  . Lymphoma ([place])   . Sore throat and laryngitis 09/10/2016    Patient Active Problem List   Diagnosis Date Noted  . Nocturnal leg cramps 07/16/2017  . FOOT PAIN, BILATERAL 11/05/2009  . Pure hypercholesterolemia 09/24/2009  . 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 TOBACCO USE, QUIT 08/18/2009  . Diabetes type 2, uncontrolled ([place]) 07/09/2009  . 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 No pertinent surgical history.     Home Medications    Prior to Admission medications   Medication Sig Start Date End Date Taking? Authorizing Provider  Alcohol Swabs PADS Use as instructed 09/05/16   Schorr, [name], NP  aspirin EC 81 MG tablet Take 1 tablet (81 mg total) by mouth daily. 07/16/17   [name], [name], DO  atorvastatin (LIPITOR) 10 MG tablet Take 1 tablet (10 mg total) by mouth daily. 07/16/17   [name], [name], DO  Blood Glucose Monitoring Suppl (RELION PRIME MONITOR) DEVI Use device as instructed 04/14/16   [name], DO  diphenhydrAMINE (BENADRYL) 25 MG tablet Take 50 mg (2 tabs) PO 1 hour  prior to contrast media. 09/08/16   Susanne Borders, NP  glipiZIDE (GLUCOTROL) 10 MG tablet TAKE 1 TABLET BY MOUTH TWICE DAILY BEFORE MEAL(S) 06/07/18   [name], [name], DO  glucose blood test strip Use as instructed 04/14/16   [name], DO  metFORMIN (GLUCOPHAGE) 1000 MG tablet Take 1 tablet (1,000 mg total) by mouth 2 (two) times daily with a meal. 07/16/17   Enid Derry, [name], DO  naproxen (NAPROSYN) 500 MG tablet Take 1 tablet (500 mg total) by mouth 2 (two) times daily with a meal. 10/10/18 10/10/19  Rosemarie Ax, MD  RELION LANCETS STANDARD 21G MISC Use as instructed 04/14/16   [name], DO  traMADol (ULTRAM) 50 MG tablet Take 1 tablet (50 mg total) by mouth every 8 (eight) hours as needed for moderate pain. Patient not taking: Reported on 09/10/2016 04/02/16   Zenia Resides, MD    Family History History reviewed. No pertinent family history.  Social History Social History   Tobacco Use  . Smoking status: Former Smoker    Last attempt to quit: 12/08/2006    Years since quitting: 11.8  . Smokeless tobacco: Former Systems developer    Quit date: 11/20/2006  Substance Use Topics  . Alcohol use: No  . Drug use: No     Allergies   Iohexol   Review of Systems  Review of Systems  Constitutional: Negative for fever.  HENT: Negative for congestion.   Respiratory: Negative for cough.   Cardiovascular: Negative for chest pain.  Gastrointestinal: Negative for abdominal pain.  Musculoskeletal: Negative for back pain.  Skin: Negative for color change.  Neurological: Negative for weakness.  Hematological: Negative for adenopathy.  Psychiatric/Behavioral: Negative for agitation.     Physical Exam Triage Vital Signs ED Triage Vitals  Enc Vitals Group     BP 10/10/18 1313 135/80     Pulse Rate 10/10/18 1313 100     Resp 10/10/18 1313 18     Temp 10/10/18 1313 99.3 F (37.4 C)     Temp Source 10/10/18 1313 Oral     SpO2 10/10/18 1313 97 %     Weight --      Height --      Head  Circumference --      Peak Flow --      Pain Score 10/10/18 1311 6     Pain Loc --      Pain Edu? --      Excl. in [place]? --    No data found.  Updated Vital Signs BP 135/80 (BP Location: Left Arm)   Pulse 100   Temp 99.3 F (37.4 C) (Oral)   Resp 18   SpO2 97%   Visual Acuity Right Eye Distance:   Left Eye Distance:   Bilateral Distance:    Right Eye Near:   Left Eye Near:    Bilateral Near:     Physical Exam Gen: NAD, alert, cooperative with exam, well-appearing ENT: normal lips, normal nasal mucosa,  Eye: normal EOM, normal conjunctiva and lids CV:  no edema, +2 pedal pulses   Resp: no accessory muscle use, non-labored,  GI: no masses or tenderness, no hernia  Skin: no rashes, no areas of induration  Neuro: normal tone, normal sensation to touch Psych:  normal insight, alert and oriented MSK:  Left and right shoulder:  Inspection reveals no abnormalities, atrophy or asymmetry. Palpation is normal with no tenderness over AC joint or bicipital groove. ROM is full in all planes. Rotator cuff strength normal throughout. Pain with Hawkin's tests, empty can sign. Speeds tests normal. No painful arc and no drop arm sign. No apprehension sign Neurovascularly intact   UC Treatments / Results  Labs (all labs ordered are listed, but only abnormal results are displayed) Labs Reviewed - No data to display  EKG None  Radiology No results found.  Procedures Procedures (including critical care time)  Medications Ordered in UC Medications - No data to display  Initial Impression / Assessment and Plan / UC Course  I have reviewed the triage vital signs and the nursing notes.  Pertinent labs & imaging results that were available during my care of the patient were reviewed by me and considered in my medical decision making (see chart for details).     [name] is a 57 yo is presenting with bilateral shoulder pain.  His right is worse than his left.  Most likely  impingement based on exam.  Likely related posture as well.  Naproxen was sent in.  Counseled on supportive care and home exercise therapy.  If no improvement could consider imaging or injection.  Final Clinical Impressions(s) / UC Diagnoses   Final diagnoses:  Chronic right shoulder pain     Discharge Instructions     Please try the exercises  Please try to put heat or ice on the shoulder  Please take the naproxen for 10 days straight and then as needed  Please follow up if your pain doesn't improve.     ED Prescriptions    Medication Sig Dispense Auth. Provider   naproxen (NAPROSYN) 500 MG tablet Take 1 tablet (500 mg total) by mouth 2 (two) times daily with a meal. 60 tablet Rosemarie Ax, MD     Controlled Substance Prescriptions [place] Controlled Substance Registry consulted? Not Applicable   Rosemarie Ax, MD 10/11/18 2114

## 2018-10-10 NOTE — ED Triage Notes (Addendum)
right shoulder pain for 3-4 months. Pain intermittent.  Hurting worse more recently,  Has pain in both shoulders.  No known injury.  Patient is a Games developer.

## 2018-10-17 ENCOUNTER — Emergency Department (HOSPITAL_COMMUNITY): Payer: Medicaid Other

## 2018-10-17 ENCOUNTER — Emergency Department (HOSPITAL_COMMUNITY)
Admission: EM | Admit: 2018-10-17 | Discharge: 2018-10-17 | Disposition: A | Discharge status: Home / Self Care | Payer: Medicaid Other | Attending: Emergency Medicine | Admitting: Emergency Medicine

## 2018-10-17 ENCOUNTER — Encounter (HOSPITAL_COMMUNITY): Payer: Self-pay

## 2018-10-17 DIAGNOSIS — Z7982 Long term (current) use of aspirin: Secondary | ICD-10-CM | POA: Diagnosis not present

## 2018-10-17 DIAGNOSIS — I10 Essential (primary) hypertension: Secondary | ICD-10-CM | POA: Insufficient documentation

## 2018-10-17 DIAGNOSIS — Z79899 Other long term (current) drug therapy: Secondary | ICD-10-CM | POA: Diagnosis not present

## 2018-10-17 DIAGNOSIS — E119 Type 2 diabetes mellitus without complications: Secondary | ICD-10-CM | POA: Insufficient documentation

## 2018-10-17 DIAGNOSIS — Z7984 Long term (current) use of oral hypoglycemic drugs: Secondary | ICD-10-CM | POA: Insufficient documentation

## 2018-10-17 DIAGNOSIS — M25511 Pain in right shoulder: Secondary | ICD-10-CM | POA: Diagnosis present

## 2018-10-17 DIAGNOSIS — Z87891 Personal history of nicotine dependence: Secondary | ICD-10-CM | POA: Insufficient documentation

## 2018-10-17 DIAGNOSIS — M19011 Primary osteoarthritis, right shoulder: Secondary | ICD-10-CM | POA: Diagnosis not present

## 2018-10-17 IMAGING — CR DG SHOULDER 2+V*R*
2 series · 2 of 2 positions shown · non-contrast
Comparison: None.

CLINICAL DATA: Pain.

EXAM:
RIGHT SHOULDER - 2+ VIEW

[shoulder grashey]
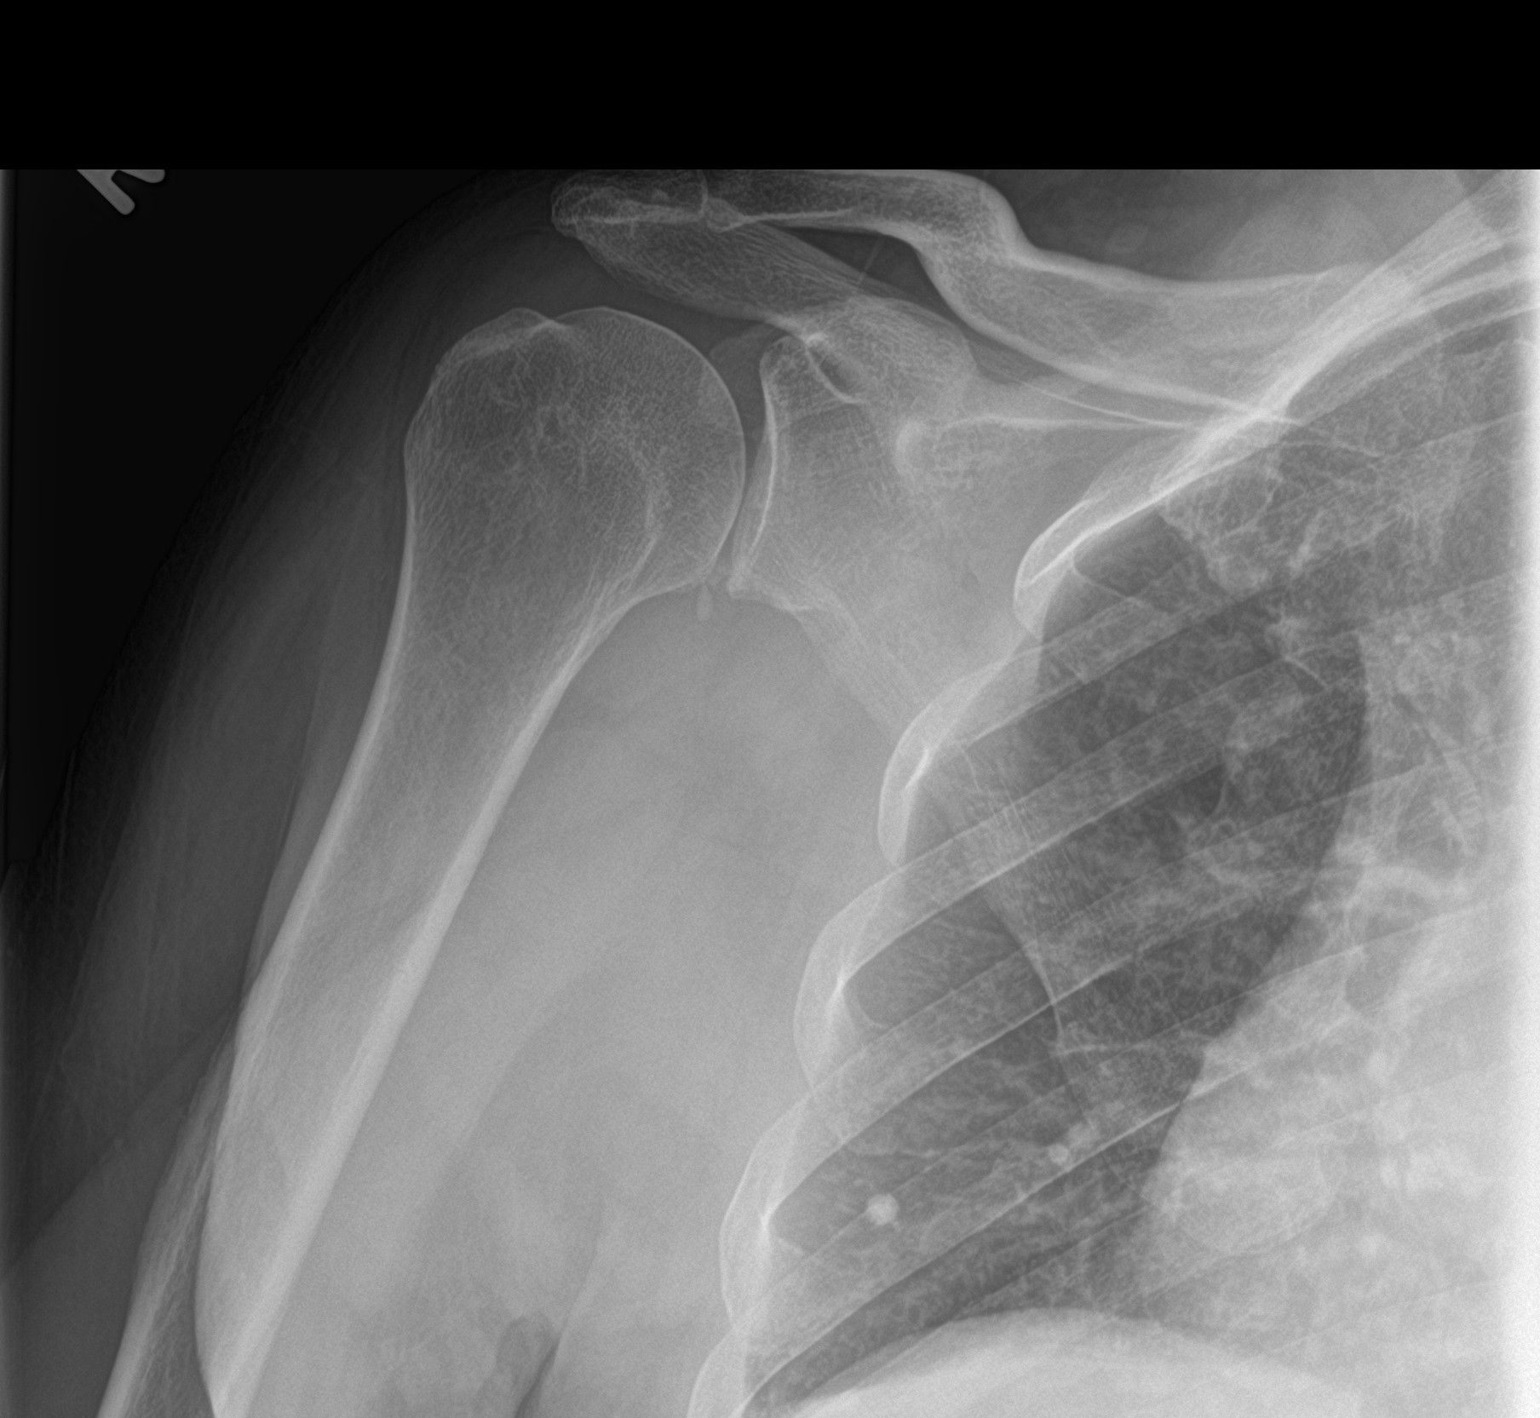

[shoulder y view]
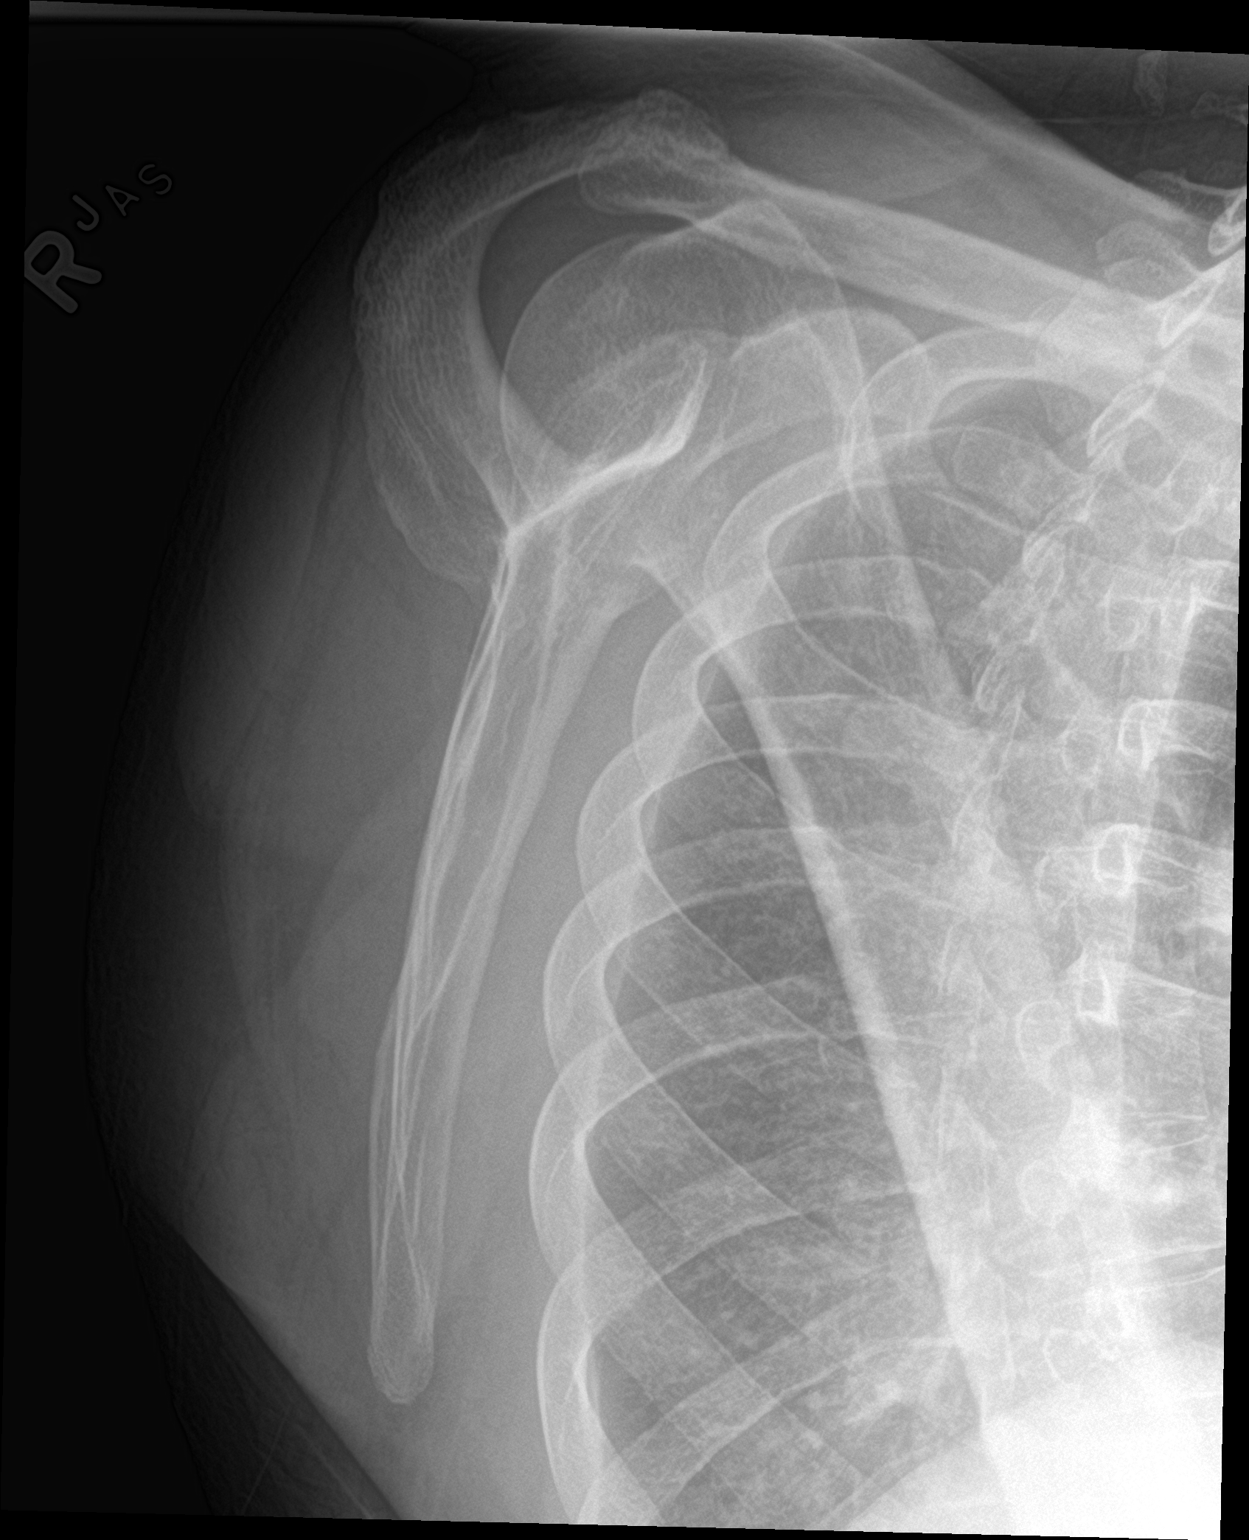

[2 of 2 positions shown; findings below may reference images not displayed]

FINDINGS: Glenohumeral degenerative changes. No fracture or other acute
abnormality.
IMPRESSION: Degenerative changes.

## 2018-10-17 MED ORDER — METHOCARBAMOL 500 MG PO TABS: 500.0000 mg | ORAL_TABLET | Freq: Two times a day (BID) | ORAL | 0 refills | Status: DC

## 2018-10-17 MED ORDER — ACETAMINOPHEN 325 MG PO TABS
650.0000 mg | ORAL_TABLET | Freq: Once | ORAL | Status: AC
  Administered 2018-10-17: 650 mg via ORAL
  Filled 2018-10-17: qty 2

## 2018-10-17 NOTE — ED Provider Notes (Signed)
[place] EMERGENCY DEPARTMENT Provider Note   CSN: [id number] Arrival date & time: 10/17/18  2027     History   Chief Complaint Chief Complaint  Patient presents with  . Shoulder Pain    HPI Blake Vaughn is a 57 y.o. male with a past medical history of hypertension, diabetes who presents to ED for right shoulder pain that began today.  States that he was swinging his arm around when he suddenly felt pain.  Reports history of similar symptoms in the past which improved with supportive measures.  Denies any prior fracture, dislocations or procedures in the area.  Denies any injuries or falls, changes to sensation or weakness, chest pain.  HPI  Past Medical History:  Diagnosis Date  . Diabetes mellitus   . Hypercholesterolemia   . Hypertension 11/27/2011  . Lymphoma [facility])    gets annual chemo last tx Feb 2013  . Lymphoma ([place])   . Sore throat and laryngitis 09/10/2016    Patient Active Problem List   Diagnosis Date Noted  . Nocturnal leg cramps 07/16/2017  . FOOT PAIN, BILATERAL 11/05/2009  . Pure hypercholesterolemia 09/24/2009  . 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 TOBACCO USE, QUIT 08/18/2009  . Diabetes type 2, uncontrolled ([place]) 07/09/2009  . 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 No pertinent surgical history.      Home Medications    Prior to Admission medications   Medication Sig Start Date End Date Taking? Authorizing Provider  Alcohol Swabs PADS Use as instructed 09/05/16   Schorr, [name], NP  aspirin EC 81 MG tablet Take 1 tablet (81 mg total) by mouth daily. 07/16/17   [name], [name], DO  atorvastatin (LIPITOR) 10 MG tablet Take 1 tablet (10 mg total) by mouth daily. 07/16/17   [name], [name], DO  Blood Glucose Monitoring Suppl (RELION PRIME MONITOR) DEVI Use device as instructed 04/14/16   [name], DO  diphenhydrAMINE (BENADRYL) 25 MG tablet Take 50 mg (2  tabs) PO 1 hour prior to contrast media. 09/08/16   Susanne Borders, NP  glipiZIDE (GLUCOTROL) 10 MG tablet TAKE 1 TABLET BY MOUTH TWICE DAILY BEFORE MEAL(S) 06/07/18   [name], [name], DO  glucose blood test strip Use as instructed 04/14/16   [name], DO  metFORMIN (GLUCOPHAGE) 1000 MG tablet Take 1 tablet (1,000 mg total) by mouth 2 (two) times daily with a meal. 07/16/17   Enid Derry, [name], DO  methocarbamol (ROBAXIN) 500 MG tablet Take 1 tablet (500 mg total) by mouth 2 (two) times daily. 10/17/18   Jaclene Bartelt, Nicanor Alcon, [name]  naproxen (NAPROSYN) 500 MG tablet Take 1 tablet (500 mg total) by mouth 2 (two) times daily with a meal. 10/10/18 10/10/19  Rosemarie Ax, MD  RELION LANCETS STANDARD 21G MISC Use as instructed 04/14/16   [name], DO  traMADol (ULTRAM) 50 MG tablet Take 1 tablet (50 mg total) by mouth every 8 (eight) hours as needed for moderate pain. Patient not taking: Reported on 09/10/2016 04/02/16   Zenia Resides, MD    Family History History reviewed. No pertinent family history.  Social History Social History   Tobacco Use  . Smoking status: Former Smoker    Last attempt to quit: 12/08/2006    Years since quitting: 11.8  . Smokeless tobacco: Former Systems developer    Quit date: 11/20/2006  Substance Use Topics  . Alcohol use: No  . Drug use: No  Allergies   Iohexol   Review of Systems Review of Systems  Constitutional: Negative for chills and fever.  Cardiovascular: Negative for chest pain.  Gastrointestinal: Negative for nausea and vomiting.  Musculoskeletal: Positive for arthralgias.  Skin: Negative for wound.  Neurological: Negative for weakness and numbness.     Physical Exam Updated Vital Signs BP (!) 141/71 (BP Location: Left Arm)   Pulse 81   Temp 98.2 F (36.8 C) (Oral)   Resp 16   SpO2 99%   Physical Exam  Constitutional: He appears well-developed and well-nourished. No distress.  HENT:  Head: Normocephalic and atraumatic.  Eyes:  Conjunctivae and EOM are normal. No scleral icterus.  Neck: Normal range of motion.  Pulmonary/Chest: Effort normal. No respiratory distress.  Musculoskeletal: Normal range of motion. He exhibits tenderness. He exhibits no edema or deformity.  Tenderness to palpation diffusely of the right shoulder.  Equal grip strength bilaterally.  Sensation intact to light touch of bilateral upper extremities.  No deformities or overlying skin changes noted.  No chest tenderness to palpation.  Pain with empty can test.  Neurological: He is alert.  Skin: No rash noted. He is not diaphoretic.  Psychiatric: He has a normal mood and affect.  Nursing note and vitals reviewed.    ED Treatments / Results  Labs (all labs ordered are listed, but only abnormal results are displayed) Labs Reviewed - No data to display  EKG None  Radiology Dg Shoulder Right  Result Date: 10/17/2018 CLINICAL DATA:  Pain. EXAM: RIGHT SHOULDER - 2+ VIEW COMPARISON:  None. FINDINGS: Glenohumeral degenerative changes. No fracture or other acute abnormality. IMPRESSION: Degenerative changes. Electronically Signed   By: Dorise Bullion III M.D   On: 10/17/2018 21:41    Procedures Procedures (including critical care time)  Medications Ordered in ED Medications  acetaminophen (TYLENOL) tablet 650 mg (650 mg Oral Given 10/17/18 2205)     Initial Impression / Assessment and Plan / ED Course  I have reviewed the triage vital signs and the nursing notes.  Pertinent labs & imaging results that were available during my care of the patient were reviewed by me and considered in my medical decision making (see chart for details).     57 year old male with past medical history of hypertension presents to ED for right shoulder pain that began today.  States that he was swinging his arm around when he began feeling the pain.  History of similar symptom in the past which improved with supportive measures.  Denies any chest pain, injuries  or falls or changes sensation.  On exam there is tenderness palpation of the right shoulder with no overlying skin or joint changes noted.  Upper extremities are neurovascularly intact with equal grip strength.  He is afebrile.  No deformity noted.  X-ray shows degenerative changes.  Suspect that this is the cause of his symptoms.  Could have some component of rotator cuff pathology as well.  Will advise him to follow-up with orthopedist and give muscle relaxer to help with symptoms.  Doubt cardiac cause of symptoms.  Doubt infectious or vascular cause as well.  Advised to return to ED for any severe worsening symptoms.  Patient is hemodynamically stable, in NAD, and able to ambulate in the ED. Evaluation does not show pathology that would require ongoing emergent intervention or inpatient treatment. I explained the diagnosis to the patient. Pain has been managed and has no complaints prior to discharge. Patient is comfortable with above plan and is stable  for discharge at this time. All questions were answered prior to disposition. Strict return precautions for returning to the ED were discussed. Encouraged follow up with PCP.    Portions of this note were generated with Lobbyist. Dictation errors may occur despite best attempts at proofreading.   Final Clinical Impressions(s) / ED Diagnoses   Final diagnoses:  Osteoarthritis of right shoulder, unspecified osteoarthritis type    ED Discharge Orders         Ordered    methocarbamol (ROBAXIN) 500 MG tablet  2 times daily     10/17/18 2200           Delia Heady, [name] 10/17/18 2232    Duffy Bruce, MD 10/19/18 0500

## 2018-10-17 NOTE — Discharge Instructions (Addendum)
Take the following medications to help with pain. Your x-ray showed that you have degenerative changes in your shoulder. Follow with the orthopedist listed below for further evaluation. Return to ED for worsening symptoms, injuries or falls, numbness in arms or legs, weakness or chest pain.  Toma los siguientes medicamentos para ayudar con Conservation officer, historic buildings. La radiografa mostr que tienes cambios degenerativos en el hombro. Siga con el ortopedista que se indica a continuacin para Educational psychologist. Regrese a la disfuncin a la disfuncin ala durante el empeoramiento de los sntomas, lesiones o cadas, entumecimiento en brazos o piernas, debilidad o dolor en el pecho.

## 2018-10-17 NOTE — ED Triage Notes (Signed)
Pt here for right shoulder pain that started today

## 2018-10-17 NOTE — ED Notes (Signed)
Pt stable, ambulatory, states understanding of discharge instructions 

## 2018-10-21 ENCOUNTER — Other Ambulatory Visit: Payer: Self-pay

## 2018-10-21 ENCOUNTER — Encounter (HOSPITAL_COMMUNITY): Payer: Self-pay | Admitting: Student

## 2018-10-21 ENCOUNTER — Emergency Department (HOSPITAL_COMMUNITY): Payer: Medicaid Other | Admitting: Anesthesiology

## 2018-10-21 ENCOUNTER — Encounter (HOSPITAL_COMMUNITY): Admission: EM | Disposition: A | Discharge status: Home / Self Care | Payer: Self-pay | Source: Home / Self Care

## 2018-10-21 ENCOUNTER — Emergency Department (HOSPITAL_COMMUNITY): Payer: Medicaid Other

## 2018-10-21 ENCOUNTER — Inpatient Hospital Stay (HOSPITAL_COMMUNITY)
Admission: EM | Admit: 2018-10-21 | Discharge: 2018-10-28 | DRG: 330 | Disposition: A | Discharge status: Home / Self Care | Payer: Medicaid Other | Attending: Surgery | Admitting: Surgery

## 2018-10-21 DIAGNOSIS — C181 Malignant neoplasm of appendix: Secondary | ICD-10-CM | POA: Diagnosis not present

## 2018-10-21 DIAGNOSIS — Z7984 Long term (current) use of oral hypoglycemic drugs: Secondary | ICD-10-CM | POA: Diagnosis not present

## 2018-10-21 DIAGNOSIS — E1165 Type 2 diabetes mellitus with hyperglycemia: Secondary | ICD-10-CM | POA: Diagnosis present

## 2018-10-21 DIAGNOSIS — K567 Ileus, unspecified: Secondary | ICD-10-CM | POA: Diagnosis not present

## 2018-10-21 DIAGNOSIS — Z789 Other specified health status: Secondary | ICD-10-CM

## 2018-10-21 DIAGNOSIS — C772 Secondary and unspecified malignant neoplasm of intra-abdominal lymph nodes: Secondary | ICD-10-CM | POA: Diagnosis present

## 2018-10-21 DIAGNOSIS — K639 Disease of intestine, unspecified: Secondary | ICD-10-CM | POA: Diagnosis present

## 2018-10-21 DIAGNOSIS — I10 Essential (primary) hypertension: Secondary | ICD-10-CM | POA: Diagnosis present

## 2018-10-21 DIAGNOSIS — Z91041 Radiographic dye allergy status: Secondary | ICD-10-CM | POA: Diagnosis not present

## 2018-10-21 DIAGNOSIS — IMO0002 Reserved for concepts with insufficient information to code with codable children: Secondary | ICD-10-CM | POA: Diagnosis present

## 2018-10-21 DIAGNOSIS — H269 Unspecified cataract: Secondary | ICD-10-CM | POA: Diagnosis not present

## 2018-10-21 DIAGNOSIS — Z8572 Personal history of non-Hodgkin lymphomas: Secondary | ICD-10-CM

## 2018-10-21 DIAGNOSIS — Z9221 Personal history of antineoplastic chemotherapy: Secondary | ICD-10-CM | POA: Diagnosis not present

## 2018-10-21 DIAGNOSIS — Z7982 Long term (current) use of aspirin: Secondary | ICD-10-CM | POA: Diagnosis not present

## 2018-10-21 DIAGNOSIS — K3580 Unspecified acute appendicitis: Secondary | ICD-10-CM | POA: Diagnosis not present

## 2018-10-21 DIAGNOSIS — R1030 Lower abdominal pain, unspecified: Secondary | ICD-10-CM | POA: Diagnosis not present

## 2018-10-21 DIAGNOSIS — K37 Unspecified appendicitis: Secondary | ICD-10-CM | POA: Insufficient documentation

## 2018-10-21 DIAGNOSIS — E7800 Pure hypercholesterolemia, unspecified: Secondary | ICD-10-CM | POA: Diagnosis not present

## 2018-10-21 DIAGNOSIS — Z87891 Personal history of nicotine dependence: Secondary | ICD-10-CM

## 2018-10-21 DIAGNOSIS — E669 Obesity, unspecified: Secondary | ICD-10-CM | POA: Diagnosis present

## 2018-10-21 DIAGNOSIS — E1136 Type 2 diabetes mellitus with diabetic cataract: Secondary | ICD-10-CM | POA: Diagnosis not present

## 2018-10-21 DIAGNOSIS — Z6836 Body mass index (BMI) 36.0-36.9, adult: Secondary | ICD-10-CM

## 2018-10-21 DIAGNOSIS — K6389 Other specified diseases of intestine: Secondary | ICD-10-CM | POA: Diagnosis present

## 2018-10-21 DIAGNOSIS — Z79899 Other long term (current) drug therapy: Secondary | ICD-10-CM | POA: Diagnosis not present

## 2018-10-21 DIAGNOSIS — C188 Malignant neoplasm of overlapping sites of colon: Secondary | ICD-10-CM | POA: Diagnosis not present

## 2018-10-21 HISTORY — DX: Unspecified appendicitis: K37

## 2018-10-21 HISTORY — PX: LAPAROSCOPIC APPENDECTOMY: SHX408

## 2018-10-21 LAB — CBC WITH DIFFERENTIAL/PLATELET
ABS IMMATURE GRANULOCYTES: 0.06 10*3/uL (ref 0.00–0.07)
BASOS PCT: 0 %
Basophils Absolute: 0 10*3/uL (ref 0.0–0.1)
Eosinophils Absolute: 0.4 10*3/uL (ref 0.0–0.5)
Eosinophils Relative: 3 %
HEMATOCRIT: 41.9 % (ref 39.0–52.0)
HEMOGLOBIN: 13.7 g/dL (ref 13.0–17.0)
IMMATURE GRANULOCYTES: 0 %
LYMPHS PCT: 16 %
Lymphs Abs: 2.2 10*3/uL (ref 0.7–4.0)
MCH: 28.9 pg (ref 26.0–34.0)
MCHC: 32.7 g/dL (ref 30.0–36.0)
MCV: 88.4 fL (ref 80.0–100.0)
MONOS PCT: 8 %
Monocytes Absolute: 1.1 10*3/uL — ABNORMAL HIGH (ref 0.1–1.0)
NEUTROS ABS: 9.8 10*3/uL — AB (ref 1.7–7.7)
NEUTROS PCT: 73 %
PLATELETS: 254 10*3/uL (ref 150–400)
RBC: 4.74 MIL/uL (ref 4.22–5.81)
RDW: 11.9 % (ref 11.5–15.5)
WBC: 13.5 10*3/uL — AB (ref 4.0–10.5)
nRBC: 0 % (ref 0.0–0.2)

## 2018-10-21 LAB — COMPREHENSIVE METABOLIC PANEL
ALK PHOS: 63 U/L (ref 38–126)
ALT: 12 U/L (ref 0–44)
AST: 12 U/L — ABNORMAL LOW (ref 15–41)
Albumin: 2.9 g/dL — ABNORMAL LOW (ref 3.5–5.0)
Anion gap: 8 (ref 5–15)
BILIRUBIN TOTAL: 0.5 mg/dL (ref 0.3–1.2)
BUN: 13 mg/dL (ref 6–20)
CALCIUM: 9.1 mg/dL (ref 8.9–10.3)
CO2: 25 mmol/L (ref 22–32)
CREATININE: 0.78 mg/dL (ref 0.61–1.24)
Chloride: 102 mmol/L (ref 98–111)
GFR calc Af Amer: >60 mL/min (ref >60)
Glucose, Bld: 256 mg/dL — ABNORMAL HIGH (ref 70–99)
Potassium: 4.3 mmol/L (ref 3.5–5.1)
Sodium: 135 mmol/L (ref 135–145)
TOTAL PROTEIN: 5.7 g/dL — AB (ref 6.5–8.1)

## 2018-10-21 LAB — URINALYSIS, ROUTINE W REFLEX MICROSCOPIC
Bacteria, UA: NONE SEEN
Bilirubin Urine: NEGATIVE
Glucose, UA: >=500 mg/dL — AB
HGB URINE DIPSTICK: NEGATIVE
Ketones, ur: NEGATIVE mg/dL
Leukocytes, UA: NEGATIVE
Nitrite: NEGATIVE
PROTEIN: 100 mg/dL — AB
SPECIFIC GRAVITY, URINE: 1.013 (ref 1.005–1.030)
pH: 6 (ref 5.0–8.0)

## 2018-10-21 LAB — LIPASE, BLOOD: LIPASE: 33 U/L (ref 11–51)

## 2018-10-21 IMAGING — CT CT ABD-PELV W/O CM
2 of 4 series · 17 of 46 positions shown, 19 images · non-contrast
Comparison: CT abdomen pelvis [DATE]

CLINICAL DATA: Abdominal pain.  Suspect appendicitis

EXAM:
CT ABDOMEN AND PELVIS WITHOUT CONTRAST
TECHNIQUE: Multidetector CT imaging of the abdomen and pelvis was performed
following the standard protocol without IV contrast.

[Series 3: ap without · axial · non-contrast · 0.79mm/px · z∈[-444,-44]mm · 14 of 90 slices shown, 16 images]
[im 5/90  soft-tissue]
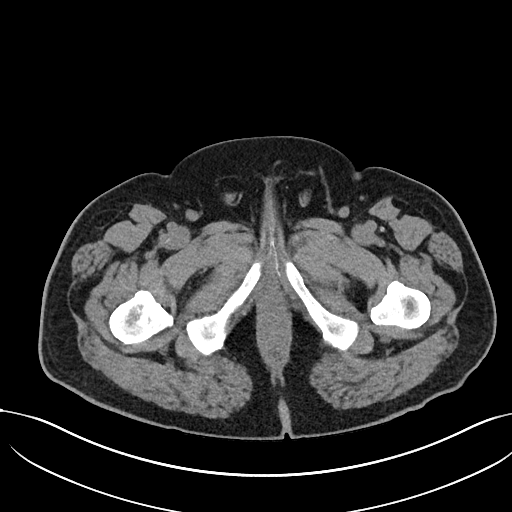
[im 5/90  bone]
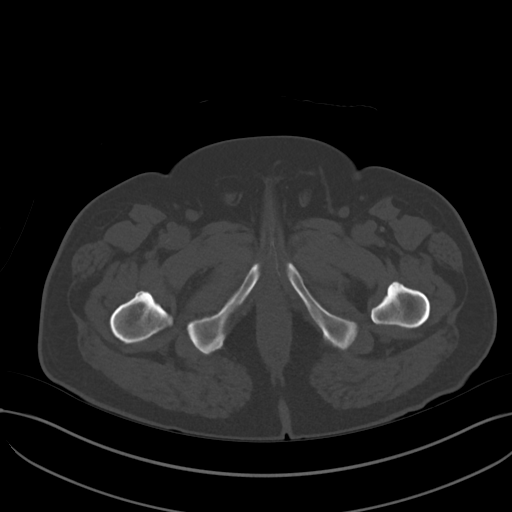
[im 10/90  soft-tissue]
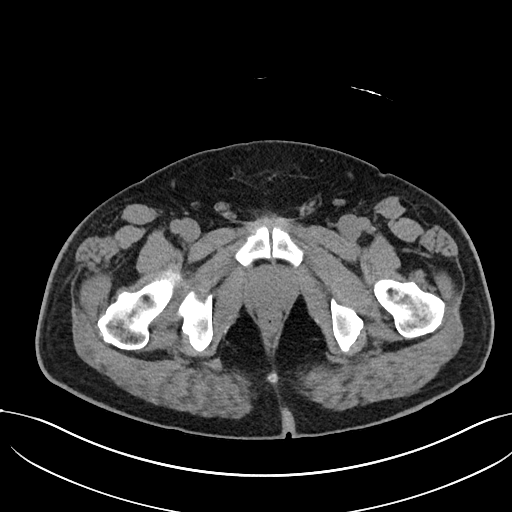
[im 19/90  soft-tissue]
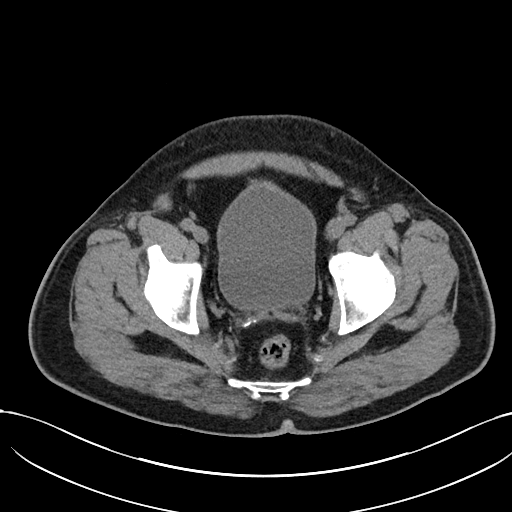
[im 24/90  soft-tissue]
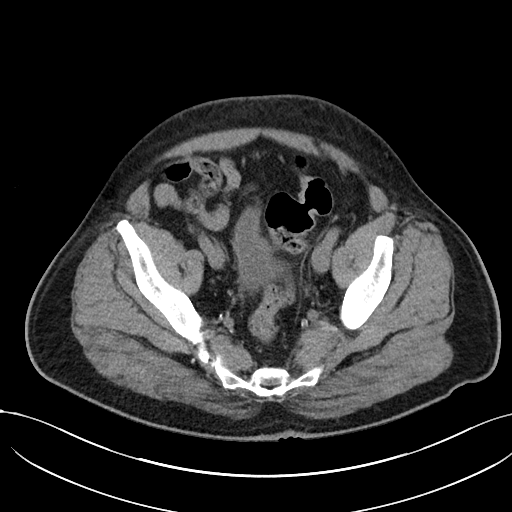
[im 29/90  soft-tissue]
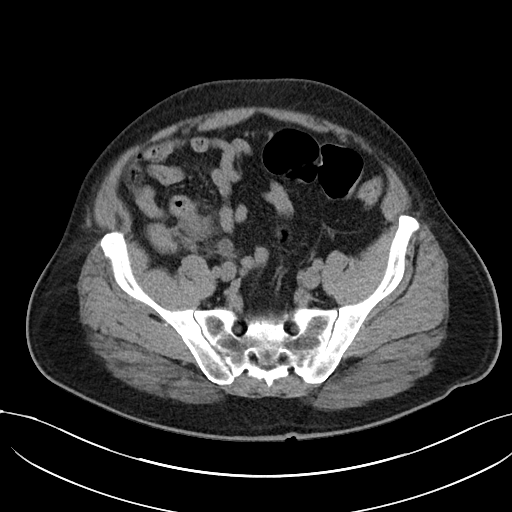
[im 38/90  soft-tissue]
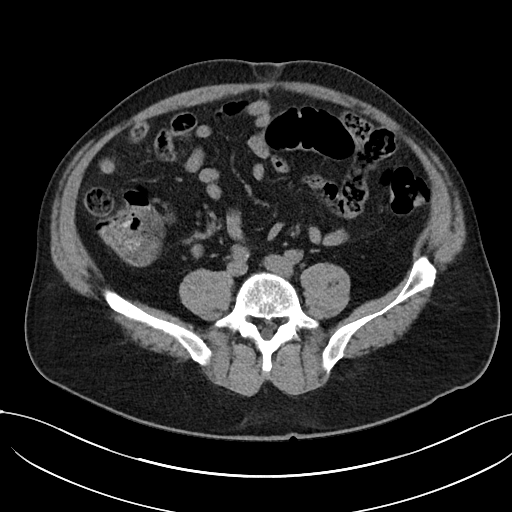
[im 43/90  soft-tissue]
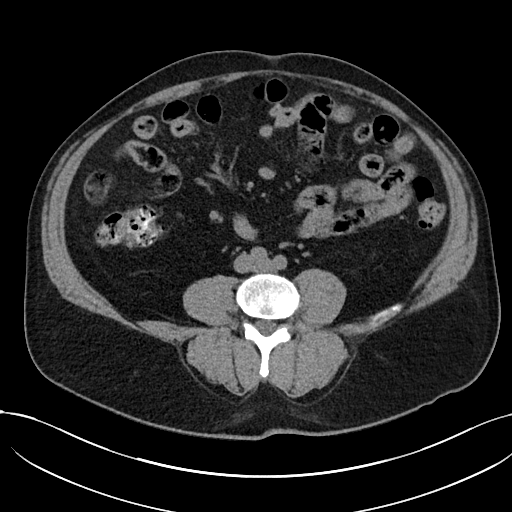
[im 47/90  soft-tissue]
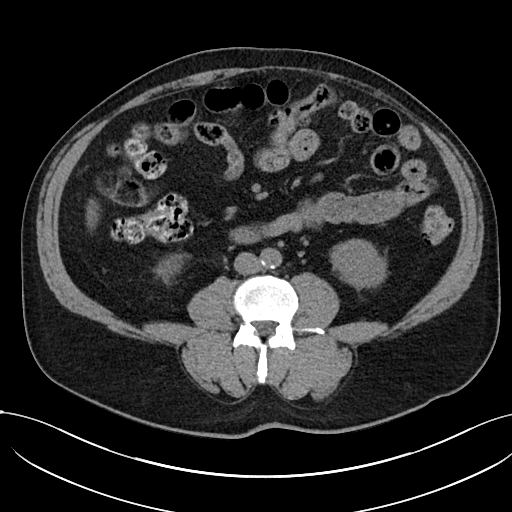
[im 52/90  soft-tissue]
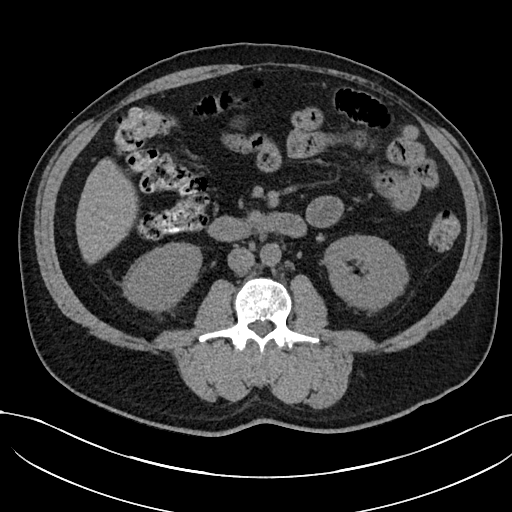
[im 52/90  bone]
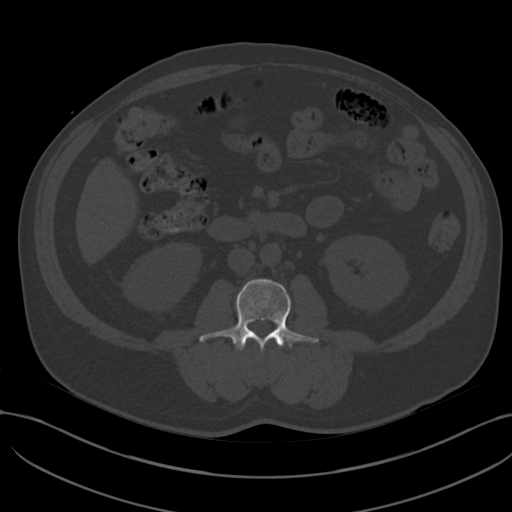
[im 61/90  soft-tissue]
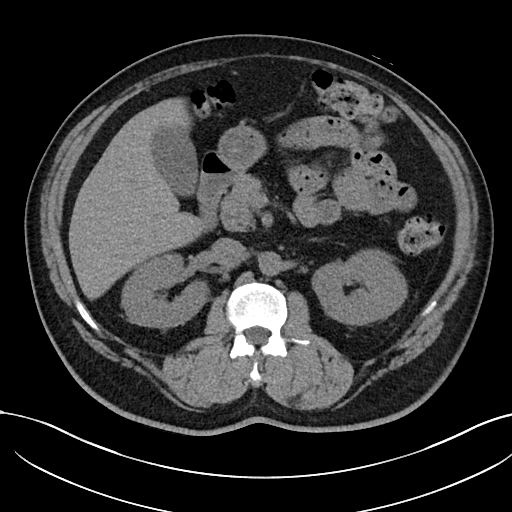
[im 66/90  soft-tissue]
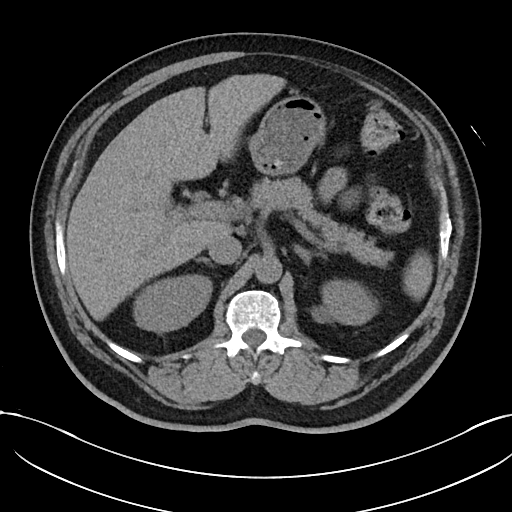
[im 71/90  soft-tissue]
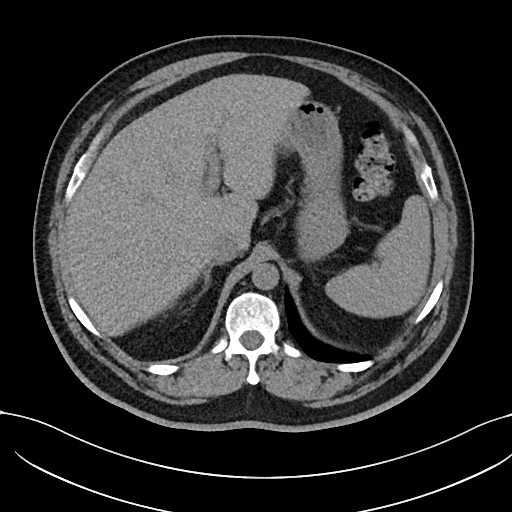
[im 80/90  soft-tissue]
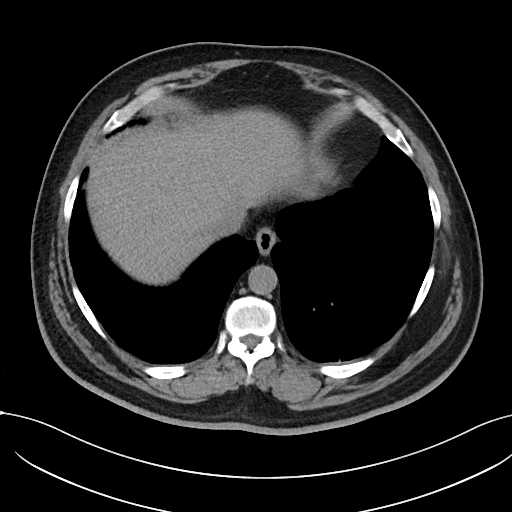
[im 85/90  soft-tissue]
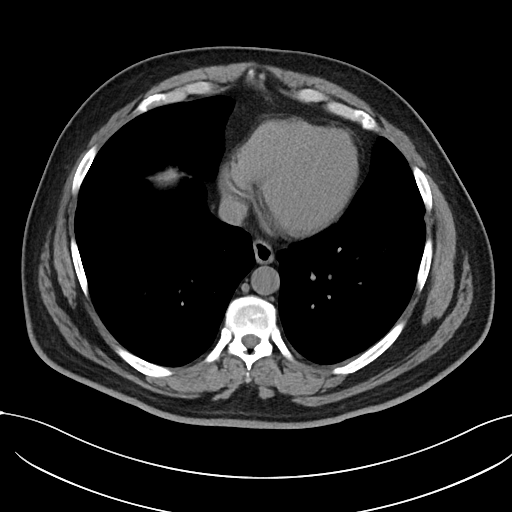

[Series 6: cor · coronal · 0.83mm/px · 3 of 112 slices shown]
[im 38/112  soft-tissue]
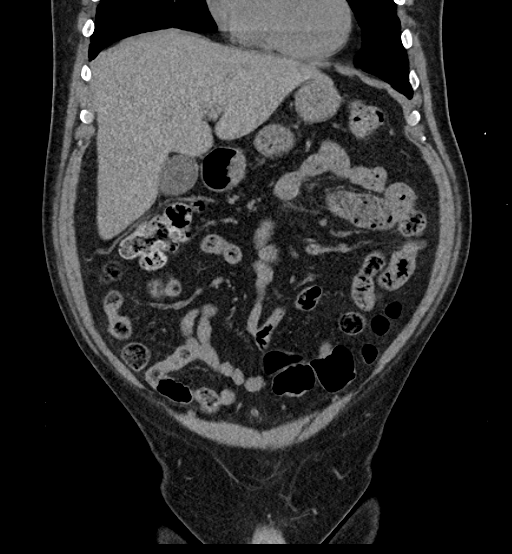
[im 50/112  soft-tissue]
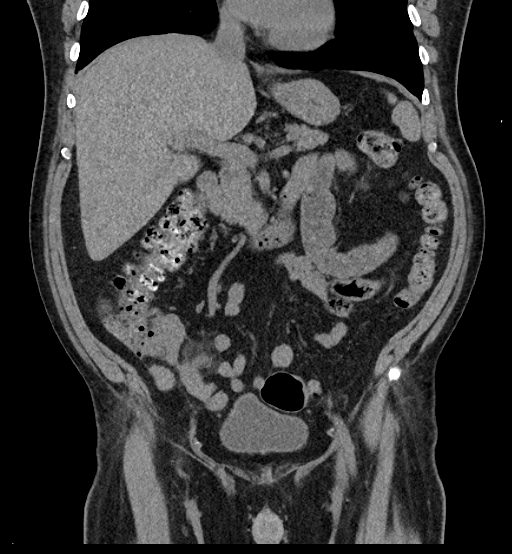
[im 62/112  soft-tissue]
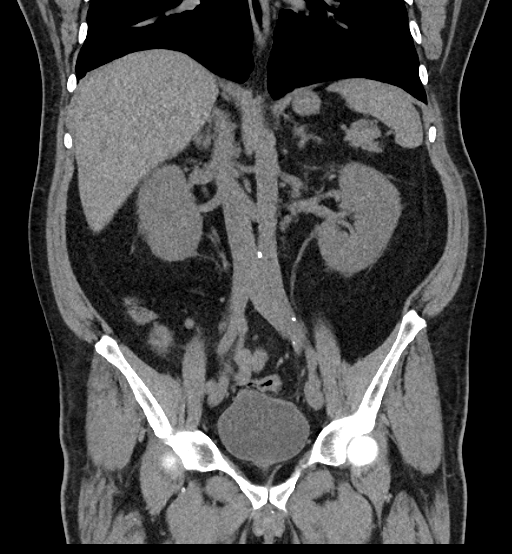

[17 of 46 positions shown; findings below may reference images not displayed]

FINDINGS: Lower chest: Calcified granuloma right middle lobe. Lung bases
clear.

Hepatobiliary: No focal liver abnormality is seen. No gallstones,
gallbladder wall thickening, or biliary dilatation.

Pancreas: Negative

Spleen: Negative

Adrenals/Urinary Tract: Adrenal glands are unremarkable. Kidneys are
normal, without renal calculi, focal lesion, or hydronephrosis.
Bladder is unremarkable. 1 cm left upper pole renal cyst unchanged
from the prior study.

Stomach/Bowel: Normal stomach.  Negative for bowel obstruction.

Dilated appendix 15 mm. Fluid-filled appendix with periappendiceal
stranding compatible with acute appendicitis. No abscess or
appendicoliths identified.

Vascular/Lymphatic: Mild atherosclerotic disease in the aorta.
Negative for lymphadenopathy.

Reproductive: Prostate enlargement.

Other: No free fluid or free air.

Musculoskeletal: No acute skeletal abnormality.
IMPRESSION: Acute appendicitis without abscess or appendicoliths. No evidence of
perforation.

## 2018-10-21 SURGERY — APPENDECTOMY, LAPAROSCOPIC
Anesthesia: General | Site: Abdomen

## 2018-10-21 MED ORDER — BUPIVACAINE-EPINEPHRINE 0.25% -1:200000 IJ SOLN
INTRAMUSCULAR | Status: DC | PRN
  Administered 2018-10-21: 10 mL
  Administered 2018-10-21: 30 mL

## 2018-10-21 MED ORDER — SODIUM CHLORIDE 0.9 % IV BOLUS
500.0000 mL | Freq: Once | INTRAVENOUS | Status: AC
  Administered 2018-10-21: 500 mL via INTRAVENOUS

## 2018-10-21 MED ORDER — SODIUM CHLORIDE 0.9 % IR SOLN
Status: DC | PRN
  Administered 2018-10-21: 1000 mL

## 2018-10-21 MED ORDER — ONDANSETRON HCL 4 MG/2ML IJ SOLN
INTRAMUSCULAR | Status: DC | PRN
  Administered 2018-10-21: 4 mg via INTRAVENOUS

## 2018-10-21 MED ORDER — ROCURONIUM BROMIDE 50 MG/5ML IV SOSY
PREFILLED_SYRINGE | INTRAVENOUS | Status: AC
  Filled 2018-10-21: qty 5

## 2018-10-21 MED ORDER — SUGAMMADEX SODIUM 200 MG/2ML IV SOLN
INTRAVENOUS | Status: AC
  Filled 2018-10-21: qty 2

## 2018-10-21 MED ORDER — SODIUM CHLORIDE 0.9 % IV SOLN
2.0000 g | Freq: Once | INTRAVENOUS | Status: AC
  Administered 2018-10-21: 2 g via INTRAVENOUS
  Filled 2018-10-21: qty 20

## 2018-10-21 MED ORDER — MIDAZOLAM HCL 5 MG/5ML IJ SOLN
INTRAMUSCULAR | Status: DC | PRN
  Administered 2018-10-21: 2 mg via INTRAVENOUS

## 2018-10-21 MED ORDER — FENTANYL CITRATE (PF) 250 MCG/5ML IJ SOLN
INTRAMUSCULAR | Status: DC | PRN
  Administered 2018-10-21 (×5): 50 ug via INTRAVENOUS
  Administered 2018-10-21: 100 ug via INTRAVENOUS
  Administered 2018-10-21 (×2): 50 ug via INTRAVENOUS

## 2018-10-21 MED ORDER — LACTATED RINGERS IV SOLN
INTRAVENOUS | Status: DC | PRN
  Administered 2018-10-21: 22:00:00 via INTRAVENOUS

## 2018-10-21 MED ORDER — SUCCINYLCHOLINE CHLORIDE 20 MG/ML IJ SOLN
INTRAMUSCULAR | Status: DC | PRN
  Administered 2018-10-21: 120 mg via INTRAVENOUS

## 2018-10-21 MED ORDER — ROCURONIUM BROMIDE 100 MG/10ML IV SOLN
INTRAVENOUS | Status: DC | PRN
  Administered 2018-10-21: 30 mg via INTRAVENOUS
  Administered 2018-10-21 (×2): 20 mg via INTRAVENOUS

## 2018-10-21 MED ORDER — PROPOFOL 10 MG/ML IV BOLUS
INTRAVENOUS | Status: DC | PRN
  Administered 2018-10-21: 180 mg via INTRAVENOUS

## 2018-10-21 MED ORDER — SUGAMMADEX SODIUM 200 MG/2ML IV SOLN
INTRAVENOUS | Status: DC | PRN
  Administered 2018-10-21: 200 mg via INTRAVENOUS

## 2018-10-21 MED ORDER — LACTATED RINGERS IV SOLN
INTRAVENOUS | Status: DC | PRN
  Administered 2018-10-21 (×2): via INTRAVENOUS

## 2018-10-21 MED ORDER — ONDANSETRON HCL 4 MG/2ML IJ SOLN
4.0000 mg | Freq: Once | INTRAMUSCULAR | Status: AC
  Administered 2018-10-21: 4 mg via INTRAVENOUS
  Filled 2018-10-21: qty 2

## 2018-10-21 MED ORDER — BUPIVACAINE-EPINEPHRINE (PF) 0.25% -1:200000 IJ SOLN
INTRAMUSCULAR | Status: AC
  Filled 2018-10-21: qty 30

## 2018-10-21 MED ORDER — 0.9 % SODIUM CHLORIDE (POUR BTL) OPTIME
TOPICAL | Status: DC | PRN
  Administered 2018-10-21: 1000 mL

## 2018-10-21 MED ORDER — FENTANYL CITRATE (PF) 250 MCG/5ML IJ SOLN
INTRAMUSCULAR | Status: AC
  Filled 2018-10-21: qty 5

## 2018-10-21 MED ORDER — MIDAZOLAM HCL 2 MG/2ML IJ SOLN
INTRAMUSCULAR | Status: AC
  Filled 2018-10-21: qty 2

## 2018-10-21 MED ORDER — PROPOFOL 10 MG/ML IV BOLUS
INTRAVENOUS | Status: AC
  Filled 2018-10-21: qty 20

## 2018-10-21 MED ORDER — METRONIDAZOLE IN NACL 5-0.79 MG/ML-% IV SOLN
500.0000 mg | Freq: Once | INTRAVENOUS | Status: AC
  Administered 2018-10-21: 500 mg via INTRAVENOUS
  Filled 2018-10-21: qty 100

## 2018-10-21 MED ORDER — MORPHINE SULFATE (PF) 4 MG/ML IV SOLN
4.0000 mg | Freq: Once | INTRAVENOUS | Status: AC
  Administered 2018-10-21: 4 mg via INTRAVENOUS
  Filled 2018-10-21: qty 1

## 2018-10-21 MED ORDER — LIDOCAINE HCL (CARDIAC) PF 100 MG/5ML IV SOSY
PREFILLED_SYRINGE | INTRAVENOUS | Status: DC | PRN
  Administered 2018-10-21: 60 mg via INTRATRACHEAL

## 2018-10-21 MED ORDER — EPHEDRINE SULFATE 50 MG/ML IJ SOLN
INTRAMUSCULAR | Status: DC | PRN
  Administered 2018-10-21: 10 mg via INTRAVENOUS

## 2018-10-21 SURGICAL SUPPLY — 57 items
APPLIER CLIP 5 13 M/L LIGAMAX5 (MISCELLANEOUS) ×3
APR CLP MED LRG 5 ANG JAW (MISCELLANEOUS) ×1
BLADE CLIPPER SURG (BLADE) ×3 IMPLANT
CANISTER SUCT 3000ML PPV (MISCELLANEOUS) ×3 IMPLANT
CHLORAPREP W/TINT 26ML (MISCELLANEOUS) ×3 IMPLANT
CLIP APPLIE 5 13 M/L LIGAMAX5 (MISCELLANEOUS) ×1 IMPLANT
COVER SURGICAL LIGHT HANDLE (MISCELLANEOUS) ×3 IMPLANT
COVER WAND RF STERILE (DRAPES) ×3 IMPLANT
CUTTER FLEX LINEAR 45M (STAPLE) ×3 IMPLANT
DERMABOND ADVANCED (GAUZE/BANDAGES/DRESSINGS) ×2
DERMABOND ADVANCED .7 DNX12 (GAUZE/BANDAGES/DRESSINGS) ×1 IMPLANT
DRSG OPSITE POSTOP 4X12 (GAUZE/BANDAGES/DRESSINGS) ×3 IMPLANT
ELECT REM PT RETURN 9FT ADLT (ELECTROSURGICAL) ×3
ELECTRODE REM PT RTRN 9FT ADLT (ELECTROSURGICAL) ×1 IMPLANT
GLOVE BIO SURGEON STRL SZ7.5 (GLOVE) ×3 IMPLANT
GLOVE INDICATOR 8.0 STRL GRN (GLOVE) ×3 IMPLANT
GOWN STRL REUS W/ TWL LRG LVL3 (GOWN DISPOSABLE) ×2 IMPLANT
GOWN STRL REUS W/ TWL XL LVL3 (GOWN DISPOSABLE) ×1 IMPLANT
GOWN STRL REUS W/TWL LRG LVL3 (GOWN DISPOSABLE) ×6
GOWN STRL REUS W/TWL XL LVL3 (GOWN DISPOSABLE) ×2
KIT BASIN OR (CUSTOM PROCEDURE TRAY) ×3 IMPLANT
KIT TURNOVER KIT B (KITS) ×3 IMPLANT
LIGASURE IMPACT 36 18CM CVD LR (INSTRUMENTS) ×3 IMPLANT
NS IRRIG 1000ML POUR BTL (IV SOLUTION) ×3 IMPLANT
PAD ARMBOARD 7.5X6 YLW CONV (MISCELLANEOUS) ×6 IMPLANT
POUCH SPECIMEN RETRIEVAL 10MM (ENDOMECHANICALS) ×3 IMPLANT
RELOAD 45 VASCULAR/THIN (ENDOMECHANICALS) ×3 IMPLANT
RELOAD PROXIMATE 75MM BLUE (ENDOMECHANICALS) ×9 IMPLANT
RELOAD STAPLE TA45 3.5 REG BLU (ENDOMECHANICALS) ×3 IMPLANT
RETRACTOR WND ALEXIS 25 LRG (MISCELLANEOUS) ×1 IMPLANT
RTRCTR WOUND ALEXIS 25CM LRG (MISCELLANEOUS) ×3
SCISSORS LAP 5X35 DISP (ENDOMECHANICALS) ×3 IMPLANT
SET IRRIG TUBING LAPAROSCOPIC (IRRIGATION / IRRIGATOR) ×3 IMPLANT
SHEARS HARMONIC ACE PLUS 36CM (ENDOMECHANICALS) ×3 IMPLANT
SLEEVE ENDOPATH XCEL 5M (ENDOMECHANICALS) ×3 IMPLANT
SPECIMEN JAR SMALL (MISCELLANEOUS) ×3 IMPLANT
SPONGE LAP 18X18 RF (DISPOSABLE) ×3 IMPLANT
STAPLER GUN LINEAR PROX 60 (STAPLE) ×3 IMPLANT
STAPLER PROXIMATE 75MM BLUE (STAPLE) ×6 IMPLANT
STAPLER VISISTAT 35W (STAPLE) ×3 IMPLANT
SUCTION POOLE TIP (SUCTIONS) ×3 IMPLANT
SUT MNCRL AB 4-0 PS2 18 (SUTURE) ×3 IMPLANT
SUT PDS AB 1 CTX 36 (SUTURE) ×6 IMPLANT
SUT PDS AB 1 TP1 96 (SUTURE) ×6 IMPLANT
SUT SILK 2 0 SH CR/8 (SUTURE) ×6 IMPLANT
SUT SILK 2 0 TIES 10X30 (SUTURE) ×3 IMPLANT
SUT SILK 3 0 (SUTURE) ×2
SUT SILK 3-0 18XBRD TIE 12 (SUTURE) ×1 IMPLANT
SYR BULB IRRIGATION 50ML (SYRINGE) ×3 IMPLANT
TOWEL OR 17X24 6PK STRL BLUE (TOWEL DISPOSABLE) ×3 IMPLANT
TOWEL OR 17X26 10 PK STRL BLUE (TOWEL DISPOSABLE) ×3 IMPLANT
TRAY FOLEY CATH SILVER 16FR (SET/KITS/TRAYS/PACK) ×3 IMPLANT
TRAY LAPAROSCOPIC MC (CUSTOM PROCEDURE TRAY) ×3 IMPLANT
TROCAR XCEL BLUNT TIP 100MML (ENDOMECHANICALS) ×3 IMPLANT
TROCAR XCEL NON-BLD 5MMX100MML (ENDOMECHANICALS) ×3 IMPLANT
TUBING INSUFFLATION (TUBING) ×3 IMPLANT
WATER STERILE IRR 1000ML POUR (IV SOLUTION) ×3 IMPLANT

## 2018-10-21 NOTE — ED Notes (Signed)
Pt made aware of need for UA 

## 2018-10-21 NOTE — Anesthesia Procedure Notes (Signed)
Procedure Name: Intubation Date/Time: 10/21/2018 9:28 PM Performed by: Clovis Cao, CRNA Pre-anesthesia Checklist: Patient identified, Emergency Drugs available, Suction available, Patient being monitored and Timeout performed Patient Re-evaluated:Patient Re-evaluated prior to induction Oxygen Delivery Method: Circle system utilized Preoxygenation: Pre-oxygenation with 100% oxygen Induction Type: IV induction, Rapid sequence and Cricoid Pressure applied Laryngoscope Size: Miller and 2 Grade View: Grade I Tube type: Oral Tube size: 7.5 mm Number of attempts: 1 Airway Equipment and Method: Stylet Placement Confirmation: ETT inserted through vocal cords under direct vision,  positive ETCO2 and breath sounds checked- equal and bilateral Secured at: 22 cm Tube secured with: Tape Dental Injury: Teeth and Oropharynx as per pre-operative assessment

## 2018-10-21 NOTE — Op Note (Signed)
10/21/2018  11:44 PM  PATIENT:  Blake Vaughn December  57 y.o. 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 Exploratory laparotomy with right hemicolectomy  SURGEON:  Nadeen Landau, MD  ASSISTANT: Coralie Keens, MD  ANESTHESIA:   general  COUNTS:  Sponge, needle and instrument counts were reported correct x2 at the conclusion of the operation.  EBL: 100cc  DRAINS: None  SPECIMEN: Right colon with terminal ileum and appendix en bloc  COMPLICATIONS: None  FINDINGS: Dilated appendix with large, firm mass at base extending into cecum. Cecum was quite adherant to lateral abdominal wall and swath of peritoneum was taken with it. Procedure converted to open.  Probable peritoneal implants were seen on peritoneum overlying the ileocolic pedicle.  After taking the ileocolic pedicle, the entire a sending colon appeared dusky and therefore a formal right hemicolectomy was performed going out to the level of the middle colic vessels.  The entire peritoneal surface of the small bowel and intraperitoneal colon and rectum or surveyed and there were no additional implants noted.  The liver felt smooth without any nodularity or palpable masses.  The stomach and remaining lining of the peritoneum also appeared to be uninvolved.  DISPOSITION: PACU in satisfactory condition  INDICATION: Blake Vaughn is an 57 y.o. male with hx of HTN, HLD, DM whom presented to the ED today with periumbilical abdominal pain that began 1 day ago. Pain remained in this locationbut increased in severeity prompting him to present to the ED for further evaluation. He reports that the pain is constant and stabbing and does not radiate. He states he has never felt anything like this in the past. He denies any n/v. He denies  any changes in his bowel habits including constipation or diarrhea.  On exam, he exhibited tenderness in the right lower quadrant.  He was afebrile with normal vital signs.  He had a leukocytosis of 13.5.  A CT abdomen pelvis was performed.  This demonstrated a 15 mm appendix which was fluid-filled with periappendiceal stranding compatible with acute appendicitis.  No other findings were identified.  Options were discussed going forward with the patient and he opted to pursue surgery.  Please refer to H&P for details regarding this discussion.  DESCRIPTION: The patient was identified in preop holding and taken to the OR where they were placed on the operating room table and SCDs were placed. General endotracheal anesthesia was induced without difficulty.  All pressure points were then padded and verified.  A Foley catheter was inserted by the operating room nurse under sterile conditions.  The patient was then prepped and draped in the usual sterile fashion. A surgical timeout was performed indicating the correct patient, procedure, positioning and need for preoperative antibiotics.   An infraumbilical incision was made and carried down to the level of the fascia.  The fascia was then scored and the peritoneal cavity entered bluntly.  A pursestring suture of 0 Vicryl was placed.  The Hassan cannula was inserted.  The peritoneal cavity was then insufflated with CO2 to a maximum pressure of 15 mmHg.  The 0 degree laparoscope was inserted and inspection revealed no evidence of trocar site complications.  2 additional 5 mm trochars were then placed one in the left lower abdomen and one in the suprapubic midline approximately 3 fingerbreadths above the pubic bone.  Care was taken to preserve the bladder free of injury during placement.  The patient was then positioned in Trendelenburg with right side up.  The small bowel was reflected out of the pelvis.  The appendix was identified and dilated.  The appendix was  grasped and upon attempts at elevation, there was noted to be a large mass at the base of the appendix on the cecum which was tethering it to the right lateral abdominal sidewall.  Peritoneal attachments to this were taken down.  The cecum was quite large and bulky due to this mass and the decision was made to convert to open.  A midline laparotomy was created and the subcutaneous tissue was dissected down to level of fascia.  The fascia was incised in the midline along the linea alba.  The peritoneal cavity is entered.  An [place] wound protector was placed.  A Balfour retractor was also placed.  The attachments of the cecum to the abdominal sidewall were taken down with electrocautery.  The cecum and ascending colon were then mobilized along the Tarun Patchell line of T[place]t.  This was a somewhat difficult dissection due to his habitus.  The cecum was then palpated and there was a large firm mass occupying a substantial portion of the cecum and occluding the appendix.  The ileocolic pedicle was identified and circumferentially dissected.  The duodenum was identified cephalad and reflected posteriorly well away from our plane of dissection.  After dissecting the ileocolic pedicle, it was noted that there were what appeared to be peritoneal implants along this vessel.  All these were within the planned area of resection.  The ileocolic pedicle was then ligated using the LigaSure.  A few minutes after performing this, the entire a sending colon appeared dusky and the decision was made to proceed with a formal right hemicolectomy.  The remaining attachments of the hepatic flexure were taken down.  The omentum was taken off of the descending colon and up to the midportion of the transverse colon.  The transverse colon was elevated and the middle colic pedicle identified.  Take in the right branch the middle colic pedicle with a LigaSure, the intervening mesentery was then ligated.  GIA staplers with a blue load were used to  divide the colon and the terminal ileum.  The specimen was passed off.  Attention was then turned to performing the ileocolic anastomosis.  The distal ileum and transverse colon were oriented.  Enterotomies were created.  An anastomosis was fashioned between the antimesenteric border of the distal ileum and the tinea coli of the transverse colon.  This was done using a 75 mm GIA blue load.  The staple line was noted to be hemostatic.  The common enterotomy was then closed using a TX 60 blue load stapler.  A "crotch" stitch was placed at the apex of the anastomosis.  A couple staple line oozers were noted and controlled with 3-0 silk U stitches.  The abdomen was then irrigated with warm saline.  Hemostasis was verified.  The omentum was placed back over the ileocolic anastomosis.  The entire small bowel was then run to the level of the duodenum.  This was normal.  The associated mesentery was also normal in appearance.  The intraperitoneal colon and rectum was inspected and there were no peritoneal implants noted.  The liver and gallbladder was palpated and was smooth without palpable masses or implants.  The anterior surface of the stomach was visualized and noted to be without evidence of  peritoneal implants.  The peritoneum was palpated and noted to be smooth throughout.  There are no masses palpable within the pelvis either.  At this point, attention was turned to closing the abdomen.  The midline fascia was approximated using 2 running #1 looped PDS sutures.  The skin of all incision sites was closed using staples.  Sterile dressings were then applied.  The patient was then awakened from anesthesia, extubated, and transferred to a stretcher for transport to PACU in satisfactory condition.

## 2018-10-21 NOTE — H&P (Signed)
CC: Consult by Kennith Maes [name]  HPI: Blake Vaughn is an 57 y.o. male with hx of HTN, HLD, DM whom presented to the ED today with periumbilical abdominal pain that began 1 day ago. Pain remained in this locationbut increased in severeity prompting him to present to the ED for further evaluation. He reports that the pain is constant and stabbing and does not radiate. He states he has never felt anything like this in the past. He denies any n/v. He denies any changes in his bowel habits including constipation or diarrhea.  He denies any prior abdominal procedures or surgeries.  Past Medical History:  Diagnosis Date  . Diabetes mellitus   . Hypercholesterolemia   . Hypertension 11/27/2011  . Lymphoma [facility])    gets annual chemo last tx Feb 2013  . Lymphoma ([place])   . Sore throat and laryngitis 09/10/2016    History reviewed. No pertinent surgical history.  History reviewed. No pertinent family history.  Social:  reports that he quit smoking about 11 years ago. He quit smokeless tobacco use about 11 years ago. He reports that he does not drink alcohol or use drugs. - Rare social EtOH use - couple times per year.  Allergies:  Allergies  Allergen Reactions  . Iohexol Other (See Comments)     Code: HIVES, Desc: pt had itching 8 hrs after 6/10 scan;and now was today 08/16/09 was given 67m benadryl 1 hr before scan and he did fine.Amy Rogal, Onset Date: [id number] HAVE PT TAKE 50MG OF BENADRYL 1HR PRIOR TO SCAN     Medications: I have reviewed the patient's current medications.  Results for orders placed or performed during the hospital encounter of 10/21/18 (from the past 48 hour(s))  CBC with Differential     Status: Abnormal   Collection Time: 10/21/18  4:59 PM  Result Value Ref Range   WBC 13.5 (H) 4.0 - 10.5 K/uL   RBC 4.74 4.22 - 5.81 MIL/uL   Hemoglobin 13.7 13.0 - 17.0 g/dL   HCT 41.9 39.0 - 52.0 %   MCV 88.4 80.0 - 100.0 fL   MCH 28.9 26.0 - 34.0 pg   MCHC  32.7 30.0 - 36.0 g/dL   RDW 11.9 11.5 - 15.5 %   Platelets 254 150 - 400 K/uL   nRBC 0.0 0.0 - 0.2 %   Neutrophils Relative % 73 %   Neutro Abs 9.8 (H) 1.7 - 7.7 K/uL   Lymphocytes Relative 16 %   Lymphs Abs 2.2 0.7 - 4.0 K/uL   Monocytes Relative 8 %   Monocytes Absolute 1.1 (H) 0.1 - 1.0 K/uL   Eosinophils Relative 3 %   Eosinophils Absolute 0.4 0.0 - 0.5 K/uL   Basophils Relative 0 %   Basophils Absolute 0.0 0.0 - 0.1 K/uL   Immature Granulocytes 0 %   Abs Immature Granulocytes 0.06 0.00 - 0.07 K/uL    Comment: Performed at [facility] [address] [place] [id number] Comprehensive metabolic panel     Status: Abnormal   Collection Time: 10/21/18  4:59 PM  Result Value Ref Range   Sodium 135 135 - 145 mmol/L   Potassium 4.3 3.5 - 5.1 mmol/L   Chloride 102 98 - 111 mmol/L   CO2 25 22 - 32 mmol/L   Glucose, Bld 256 (H) 70 - 99 mg/dL   BUN 13 6 - 20 mg/dL   Creatinine, Ser 0.78 0.61 - 1.24 mg/dL   Calcium 9.1  8.9 - 10.3 mg/dL   Total Protein 5.7 (L) 6.5 - 8.1 g/dL   Albumin 2.9 (L) 3.5 - 5.0 g/dL   AST 12 (L) 15 - 41 U/L   ALT 12 0 - 44 U/L   Alkaline Phosphatase 63 38 - 126 U/L   Total Bilirubin 0.5 0.3 - 1.2 mg/dL   GFR calc non Af Amer >60 >60 mL/min   GFR calc Af Amer >60 >60 mL/min    Comment: (NOTE) The eGFR has been calculated using the CKD EPI equation. This calculation has not been validated in all clinical situations. eGFR's persistently <60 mL/min signify possible Chronic Kidney Disease.    Anion gap 8 5 - 15    Comment: Performed at [facility] [address], [place], [place] [address]  Lipase, blood     Status: None   Collection Time: 10/21/18  4:59 PM  Result Value Ref Range   Lipase 33 11 - 51 U/L    Comment: Performed at [facility] [address] [place], [place] [address]  Urinalysis, Routine w reflex microscopic     Status: Abnormal   Collection Time: 10/21/18  4:59 PM  Result Value Ref Range   Color, Urine  YELLOW YELLOW   APPearance CLEAR CLEAR   Specific Gravity, Urine 1.013 1.005 - 1.030   pH 6.0 5.0 - 8.0   Glucose, UA >=500 (A) NEGATIVE mg/dL   Hgb urine dipstick NEGATIVE NEGATIVE   Bilirubin Urine NEGATIVE NEGATIVE   Ketones, ur NEGATIVE NEGATIVE mg/dL   Protein, ur 100 (A) NEGATIVE mg/dL   Nitrite NEGATIVE NEGATIVE   Leukocytes, UA NEGATIVE NEGATIVE   RBC / HPF 0-5 0 - 5 RBC/hpf   WBC, UA 0-5 0 - 5 WBC/hpf   Bacteria, UA NONE SEEN NONE SEEN   Squamous Epithelial / LPF 0-5 0 - 5    Comment: Performed at [facility], [place] [address], [place], [place] [address]    Ct Abdomen Pelvis Wo Contrast  Result Date: 10/21/2018 CLINICAL DATA:  Abdominal pain.  Suspect appendicitis EXAM: CT ABDOMEN AND PELVIS WITHOUT CONTRAST TECHNIQUE: Multidetector CT imaging of the abdomen and pelvis was performed following the standard protocol without IV contrast. COMPARISON:  CT abdomen pelvis 09/09/2016 FINDINGS: Lower chest: Calcified granuloma right middle lobe. Lung bases clear. Hepatobiliary: No focal liver abnormality is seen. No gallstones, gallbladder wall thickening, or biliary dilatation. Pancreas: Negative Spleen: Negative Adrenals/Urinary Tract: Adrenal glands are unremarkable. Kidneys are normal, without renal calculi, focal lesion, or hydronephrosis. Bladder is unremarkable. 1 cm left upper pole renal cyst unchanged from the prior study. Stomach/Bowel: Normal stomach.  Negative for bowel obstruction. Dilated appendix 15 mm. Fluid-filled appendix with periappendiceal stranding compatible with acute appendicitis. No abscess or appendicoliths identified. Vascular/Lymphatic: Mild atherosclerotic disease in the aorta. Negative for lymphadenopathy. Reproductive: Prostate enlargement. Other: No free fluid or free air. Musculoskeletal: No acute skeletal abnormality. IMPRESSION: Acute appendicitis without abscess or appendicoliths. No evidence of perforation. Electronically Signed   By: Franchot Gallo  M.D.   On: 10/21/2018 19:29    ROS - all of the below systems have been reviewed with the patient and positives are indicated with bold text General: chills, fever or night sweats Eyes: blurry vision or double vision ENT: epistaxis or sore throat Allergy/Immunology: itchy/watery eyes or nasal congestion Hematologic/Lymphatic: bleeding problems, blood clots or swollen lymph nodes Endocrine: temperature intolerance or unexpected weight changes Breast: new or changing breast lumps or nipple discharge Resp: cough,  shortness of breath, or wheezing CV: chest pain or dyspnea on exertion GI: as per HPI GU: dysuria, trouble voiding, or hematuria MSK: joint pain or joint stiffness Neuro: TIA or stroke symptoms Derm: pruritus and skin lesion changes Psych: anxiety and depression  PE Blood pressure (!) 146/79, pulse 84, temperature 98.6 F (37 C), temperature source Oral, resp. rate 16, height '5\' 2"'  (1.575 m), weight 81.6 kg, SpO2 98 %. Constitutional: NAD; conversant; no deformities Eyes: Moist conjunctiva; no lid lag; anicteric; PERRL Neck: Trachea midline; no thyromegaly Lungs: Normal respiratory effort; no tactile fremitus CV: RRR; no palpable thrills; no pitting edema GI: Abd soft, ttp in hypogastrium and RLQ; no rebound; no guarding; nondistended; no palpable hepatosplenomegaly MSK: Normal gait; no clubbing/cyanosis Psychiatric: Appropriate affect; alert and oriented x3 Lymphatic: No palpable cervical or axillary lymphadenopathy  Results for orders placed or performed during the hospital encounter of 10/21/18 (from the past 48 hour(s))  CBC with Differential     Status: Abnormal   Collection Time: 10/21/18  4:59 PM  Result Value Ref Range   WBC 13.5 (H) 4.0 - 10.5 K/uL   RBC 4.74 4.22 - 5.81 MIL/uL   Hemoglobin 13.7 13.0 - 17.0 g/dL   HCT 41.9 39.0 - 52.0 %   MCV 88.4 80.0 - 100.0 fL   MCH 28.9 26.0 - 34.0 pg   MCHC 32.7 30.0 - 36.0 g/dL   RDW 11.9 11.5 - 15.5 %   Platelets 254  150 - 400 K/uL   nRBC 0.0 0.0 - 0.2 %   Neutrophils Relative % 73 %   Neutro Abs 9.8 (H) 1.7 - 7.7 K/uL   Lymphocytes Relative 16 %   Lymphs Abs 2.2 0.7 - 4.0 K/uL   Monocytes Relative 8 %   Monocytes Absolute 1.1 (H) 0.1 - 1.0 K/uL   Eosinophils Relative 3 %   Eosinophils Absolute 0.4 0.0 - 0.5 K/uL   Basophils Relative 0 %   Basophils Absolute 0.0 0.0 - 0.1 K/uL   Immature Granulocytes 0 %   Abs Immature Granulocytes 0.06 0.00 - 0.07 K/uL    Comment: Performed at [facility], [address] [place], [place] [address]  Comprehensive metabolic panel     Status: Abnormal   Collection Time: 10/21/18  4:59 PM  Result Value Ref Range   Sodium 135 135 - 145 mmol/L   Potassium 4.3 3.5 - 5.1 mmol/L   Chloride 102 98 - 111 mmol/L   CO2 25 22 - 32 mmol/L   Glucose, Bld 256 (H) 70 - 99 mg/dL   BUN 13 6 - 20 mg/dL   Creatinine, Ser 0.78 0.61 - 1.24 mg/dL   Calcium 9.1 8.9 - 10.3 mg/dL   Total Protein 5.7 (L) 6.5 - 8.1 g/dL   Albumin 2.9 (L) 3.5 - 5.0 g/dL   AST 12 (L) 15 - 41 U/L   ALT 12 0 - 44 U/L   Alkaline Phosphatase 63 38 - 126 U/L   Total Bilirubin 0.5 0.3 - 1.2 mg/dL   GFR calc non Af Amer >60 >60 mL/min   GFR calc Af Amer >60 >60 mL/min    Comment: (NOTE) The eGFR has been calculated using the CKD EPI equation. This calculation has not been validated in all clinical situations. eGFR's persistently <60 mL/min signify possible Chronic Kidney Disease.    Anion gap 8 5 - 15    Comment: Performed at [facility] [address], [place], [place] [address]  Lipase,  blood     Status: None   Collection Time: 10/21/18  4:59 PM  Result Value Ref Range   Lipase 33 11 - 51 U/L    Comment: Performed at [facility] [address], [place], [place] [address]  Urinalysis, Routine w reflex microscopic     Status: Abnormal   Collection Time: 10/21/18  4:59 PM  Result Value Ref Range   Color, Urine YELLOW YELLOW   APPearance CLEAR CLEAR   Specific Gravity, Urine  1.013 1.005 - 1.030   pH 6.0 5.0 - 8.0   Glucose, UA >=500 (A) NEGATIVE mg/dL   Hgb urine dipstick NEGATIVE NEGATIVE   Bilirubin Urine NEGATIVE NEGATIVE   Ketones, ur NEGATIVE NEGATIVE mg/dL   Protein, ur 100 (A) NEGATIVE mg/dL   Nitrite NEGATIVE NEGATIVE   Leukocytes, UA NEGATIVE NEGATIVE   RBC / HPF 0-5 0 - 5 RBC/hpf   WBC, UA 0-5 0 - 5 WBC/hpf   Bacteria, UA NONE SEEN NONE SEEN   Squamous Epithelial / LPF 0-5 0 - 5    Comment: Performed at [facility], [place] [address], [place], [place] [address]    Ct Abdomen Pelvis Wo Contrast  Result Date: 10/21/2018 CLINICAL DATA:  Abdominal pain.  Suspect appendicitis EXAM: CT ABDOMEN AND PELVIS WITHOUT CONTRAST TECHNIQUE: Multidetector CT imaging of the abdomen and pelvis was performed following the standard protocol without IV contrast. COMPARISON:  CT abdomen pelvis 09/09/2016 FINDINGS: Lower chest: Calcified granuloma right middle lobe. Lung bases clear. Hepatobiliary: No focal liver abnormality is seen. No gallstones, gallbladder wall thickening, or biliary dilatation. Pancreas: Negative Spleen: Negative Adrenals/Urinary Tract: Adrenal glands are unremarkable. Kidneys are normal, without renal calculi, focal lesion, or hydronephrosis. Bladder is unremarkable. 1 cm left upper pole renal cyst unchanged from the prior study. Stomach/Bowel: Normal stomach.  Negative for bowel obstruction. Dilated appendix 15 mm. Fluid-filled appendix with periappendiceal stranding compatible with acute appendicitis. No abscess or appendicoliths identified. Vascular/Lymphatic: Mild atherosclerotic disease in the aorta. Negative for lymphadenopathy. Reproductive: Prostate enlargement. Other: No free fluid or free air. Musculoskeletal: No acute skeletal abnormality. IMPRESSION: Acute appendicitis without abscess or appendicoliths. No evidence of perforation. Electronically Signed   By: Franchot Gallo M.D.   On: 10/21/2018 19:29    A/P: Blake Vaughn is an 57  y.o. male with hx of HTN, HLD, DM - here with acute non-perforated appendicitis  -The anatomy and physiology of the GI tract was discussed at length with the patient. The pathophysiology of appendicitis was discussed at length as well. -I discussed options moving forward for treatment including observation with IV abx vs surgery. We discussed that with antibiotics alone, there is reasonable success in managing appendicitis without needing an operation. We discussed risks of recurrence at 12yr being as high as 40% in some studies (APPAC trial in cases of acute uncomplicated appendicitis - exclusions being perforation, abscess, suspected malignancy, appenddicolith, age <18 or >60, peritonitis, serious comorbidities). We discussed the surgical procedure including laparoscopic and potential open techniques. We discussed the material risks (including, but not limited to, pain, bleeding, infection, scarring, need for blood transfusion, damage to surrounding structures- blood vessels/nerves/viscus/organs, damage to ureter/bladder, urine leak, leak from staple line, need for additional procedures, need for stoma which may be permanent, hernia, recurrence although quite low, pneumonia, heart attack, stroke, death) benefits and alternatives to surgery were discussed at length. The patient's questions were answered to his satisfaction, he voiced understanding and after considering his options, opted to proceed  with surgery. Additionally, we discussed typical postoperative expectations including lifting restrictions and the recovery process.  The entire above H&P and consent was done with the use of a spanish interpreter Roselee Nova [phone]  [name]. [name], M.D. [facility]

## 2018-10-21 NOTE — ED Notes (Signed)
Patient transported to CT 

## 2018-10-21 NOTE — ED Triage Notes (Signed)
Pt came from home with complaints of abdominal pain in the center. Denies N/V/D. Pt states no one has been sick around him.Pt states he is having some pain in his right arm as well

## 2018-10-21 NOTE — Transfer of Care (Signed)
Immediate Anesthesia Transfer of Care Note  Patient: Blake Vaughn  Procedure(s) Performed: Exploratory laparotomy right hemicolectomy (N/A Abdomen)  Patient Location: PACU  Anesthesia Type:General  Level of Consciousness: drowsy  Airway & Oxygen Therapy: Patient Spontanous Breathing  Post-op Assessment: Report given to RN and Post -op Vital signs reviewed and stable  Post vital signs: Reviewed and stable  Last Vitals:  Vitals Value Taken Time  BP 178/90 10/21/2018 11:59 PM  Temp    Pulse 90 10/21/2018 11:59 PM  Resp 27 10/21/2018 11:59 PM  SpO2 100 % 10/21/2018 11:59 PM  Vitals shown include unvalidated device data.  Last Pain:  Vitals:   10/21/18 1905  TempSrc:   PainSc: 3          Complications: No apparent anesthesia complications

## 2018-10-21 NOTE — Anesthesia Preprocedure Evaluation (Signed)
Anesthesia Evaluation  Patient identified by MRN, date of birth, ID band Patient awake    Reviewed: Allergy & Precautions, NPO status , Patient's Chart, lab work & pertinent test results  Airway Mallampati: II  TM Distance: >3 FB Neck ROM: Full    Dental  (+) Teeth Intact, Dental Advisory Given   Pulmonary former smoker,    Pulmonary exam normal breath sounds clear to auscultation       Cardiovascular hypertension, Normal cardiovascular exam Rhythm:Regular Rate:Normal     Neuro/Psych negative neurological ROS     GI/Hepatic negative GI ROS, Neg liver ROS,   Endo/Other  diabetes, Type 2, Oral Hypoglycemic AgentsObesity   Renal/GU negative Renal ROS     Musculoskeletal negative musculoskeletal ROS (+)   Abdominal   Peds  Hematology Lymphoma s/p chemo   Anesthesia Other Findings Day of surgery medications reviewed with the patient.  Reproductive/Obstetrics                             Anesthesia Physical Anesthesia Plan  ASA: II and emergent  Anesthesia Plan: General   Post-op Pain Management:    Induction: Intravenous, Rapid sequence and Cricoid pressure planned  PONV Risk Score and Plan: 4 or greater and Midazolam, Ondansetron and Dexamethasone  Airway Management Planned: Oral ETT  Additional Equipment:   Intra-op Plan:   Post-operative Plan: Extubation in OR  Informed Consent: I have reviewed the patients History and Physical, chart, labs and discussed the procedure including the risks, benefits and alternatives for the proposed anesthesia with the patient or authorized representative who has indicated his/her understanding and acceptance.   Dental advisory given  Plan Discussed with: CRNA  Anesthesia Plan Comments: (Interpreter utilized.)        Anesthesia Quick Evaluation

## 2018-10-21 NOTE — ED Provider Notes (Signed)
[facility] EMERGENCY DEPARTMENT Provider Note   CSN: [id number] Arrival date & time: 10/21/18  1640     History   Chief Complaint Abdominal pain   HPI DEKLYN GIBBON is a 57 y.o. male with a history of hypertension, diabetes mellitus, hypercholesterolemia, and prior lymphoma in remission who presents to the emergency department with complaints of abdominal pain which started yesterday afternoon.  Patient describes the pain as aching/sharp, non radiating in the periumbilical area.  Pain has been constant without specific alleviating or aggravating factors.  He has tried Pepto-Bismol without relief. Also has been taking robaxin for shoulder pain from last ER visit without change in abdominal pain.   Denies fever, chills, nausea, vomiting, diarrhea, blood in the stool, dysuria, or testicular pain/swelling. No recent increase in NSAIDs/EtOH. Last PO intake was shrimp/vegetable soup at 1500 this afternoon. No prior abdominal surgeries.   Translator line utilized throughout Sales executive.  HPI  Past Medical History:  Diagnosis Date  . Diabetes mellitus   . Hypercholesterolemia   . Hypertension 11/27/2011  . Lymphoma [facility])    gets annual chemo last tx Feb 2013  . Lymphoma ([place])   . Sore throat and laryngitis 09/10/2016    Patient Active Problem List   Diagnosis Date Noted  . Nocturnal leg cramps 07/16/2017  . FOOT PAIN, BILATERAL 11/05/2009  . Pure hypercholesterolemia 09/24/2009  . 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 TOBACCO USE, QUIT 08/18/2009  . Diabetes type 2, uncontrolled ([place]) 07/09/2009  . 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 No pertinent surgical history.      Home Medications    Prior to Admission medications   Medication Sig Start Date End Date Taking? Authorizing Provider  Alcohol Swabs PADS Use as instructed 09/05/16   Schorr, [name], NP  aspirin EC 81 MG tablet Take 1 tablet  (81 mg total) by mouth daily. 07/16/17   [name], [name], DO  atorvastatin (LIPITOR) 10 MG tablet Take 1 tablet (10 mg total) by mouth daily. 07/16/17   [name], [name], DO  Blood Glucose Monitoring Suppl (RELION PRIME MONITOR) DEVI Use device as instructed 04/14/16   [name], DO  diphenhydrAMINE (BENADRYL) 25 MG tablet Take 50 mg (2 tabs) PO 1 hour prior to contrast media. 09/08/16   Susanne Borders, NP  glipiZIDE (GLUCOTROL) 10 MG tablet TAKE 1 TABLET BY MOUTH TWICE DAILY BEFORE MEAL(S) 06/07/18   [name], [name], DO  glucose blood test strip Use as instructed 04/14/16   [name], DO  metFORMIN (GLUCOPHAGE) 1000 MG tablet Take 1 tablet (1,000 mg total) by mouth 2 (two) times daily with a meal. 07/16/17   Enid Derry, [name], DO  methocarbamol (ROBAXIN) 500 MG tablet Take 1 tablet (500 mg total) by mouth 2 (two) times daily. 10/17/18   Khatri, Nicanor Alcon, [name]  naproxen (NAPROSYN) 500 MG tablet Take 1 tablet (500 mg total) by mouth 2 (two) times daily with a meal. 10/10/18 10/10/19  Rosemarie Ax, MD  RELION LANCETS STANDARD 21G MISC Use as instructed 04/14/16   [name], DO  traMADol (ULTRAM) 50 MG tablet Take 1 tablet (50 mg total) by mouth every 8 (eight) hours as needed for moderate pain. Patient not taking: Reported on 09/10/2016 04/02/16   Zenia Resides, MD    Family History History reviewed. No pertinent family history.  Social History Social History   Tobacco Use  . Smoking status: Former Smoker    Last  attempt to quit: 12/08/2006    Years since quitting: 11.8  . Smokeless tobacco: Former Systems developer    Quit date: 11/20/2006  Substance Use Topics  . Alcohol use: No  . Drug use: No     Allergies   Iohexol   Review of Systems Review of Systems  Constitutional: Negative for chills and fever.  Respiratory: Negative for shortness of breath.   Cardiovascular: Negative for chest pain.  Gastrointestinal: Positive for abdominal pain. Negative for blood in stool, constipation,  diarrhea, nausea and vomiting.  Genitourinary: Negative for dysuria, scrotal swelling and testicular pain.  All other systems reviewed and are negative.    Physical Exam Updated Vital Signs BP (!) 146/79 (BP Location: Right Arm)   Pulse 84   Temp 98.6 F (37 C) (Oral)   Resp 16   SpO2 98%   Physical Exam  Constitutional: He appears well-developed and well-nourished.  Non-toxic appearance. No distress.  HENT:  Head: Normocephalic and atraumatic.  Eyes: Conjunctivae are normal. Right eye exhibits no discharge. Left eye exhibits no discharge.  Neck: Neck supple.  Cardiovascular: Normal rate and regular rhythm.  Pulmonary/Chest: Effort normal and breath sounds normal. No respiratory distress. He has no wheezes. He has no rhonchi. He has no rales.  Respiration even and unlabored  Abdominal: Soft. He exhibits no distension. There is tenderness in the right lower quadrant and periumbilical area. There is tenderness at McBurney's point. There is no rigidity, no rebound and no guarding.  Negative obturator/psoas.   Neurological: He is alert.  Clear speech.   Skin: Skin is warm and dry. No rash noted.  Psychiatric: He has a normal mood and affect. His behavior is normal.  Nursing note and vitals reviewed.   ED Treatments / Results  Labs (all labs ordered are listed, but only abnormal results are displayed) Labs Reviewed  CBC WITH DIFFERENTIAL/PLATELET - Abnormal; Notable for the following components:      Result Value   WBC 13.5 (*)    Neutro Abs 9.8 (*)    Monocytes Absolute 1.1 (*)    All other components within normal limits  COMPREHENSIVE METABOLIC PANEL - Abnormal; Notable for the following components:   Glucose, Bld 256 (*)    Total Protein 5.7 (*)    Albumin 2.9 (*)    AST 12 (*)    All other components within normal limits  URINALYSIS, ROUTINE W REFLEX MICROSCOPIC - Abnormal; Notable for the following components:   Glucose, UA >=500 (*)    Protein, ur 100 (*)    All  other components within normal limits  LIPASE, BLOOD    EKG None  Radiology Ct Abdomen Pelvis Wo Contrast  Result Date: 10/21/2018 CLINICAL DATA:  Abdominal pain.  Suspect appendicitis EXAM: CT ABDOMEN AND PELVIS WITHOUT CONTRAST TECHNIQUE: Multidetector CT imaging of the abdomen and pelvis was performed following the standard protocol without IV contrast. COMPARISON:  CT abdomen pelvis 09/09/2016 FINDINGS: Lower chest: Calcified granuloma right middle lobe. Lung bases clear. Hepatobiliary: No focal liver abnormality is seen. No gallstones, gallbladder wall thickening, or biliary dilatation. Pancreas: Negative Spleen: Negative Adrenals/Urinary Tract: Adrenal glands are unremarkable. Kidneys are normal, without renal calculi, focal lesion, or hydronephrosis. Bladder is unremarkable. 1 cm left upper pole renal cyst unchanged from the prior study. Stomach/Bowel: Normal stomach.  Negative for bowel obstruction. Dilated appendix 15 mm. Fluid-filled appendix with periappendiceal stranding compatible with acute appendicitis. No abscess or appendicoliths identified. Vascular/Lymphatic: Mild atherosclerotic disease in the aorta. Negative for lymphadenopathy. Reproductive:  Prostate enlargement. Other: No free fluid or free air. Musculoskeletal: No acute skeletal abnormality. IMPRESSION: Acute appendicitis without abscess or appendicoliths. No evidence of perforation. Electronically Signed   By: Franchot Gallo M.D.   On: 10/21/2018 19:29    Procedures Procedures (including critical care time)  Medications Ordered in ED Medications  morphine 4 MG/ML injection 4 mg (4 mg Intravenous Given 10/21/18 1747)  ondansetron (ZOFRAN) injection 4 mg (4 mg Intravenous Given 10/21/18 1747)  sodium chloride 0.9 % bolus 500 mL (0 mLs Intravenous Stopped 10/21/18 1851)  cefTRIAXone (ROCEPHIN) 2 g in sodium chloride 0.9 % 100 mL IVPB (0 g Intravenous Stopped 10/21/18 2030)    And  metroNIDAZOLE (FLAGYL) IVPB 500 mg (500  mg Intravenous New Bag/Given 10/21/18 1945)     Initial Impression / Assessment and Plan / ED Course  I have reviewed the triage vital signs and the nursing notes.  Pertinent labs & imaging results that were available during my care of the patient were reviewed by me and considered in my medical decision making (see chart for details).   Patient presents to the emergency department with abdominal pain that started yesterday.  Patient nontoxic-appearing, no apparent distress, vitals WNL with the exception of elevated blood pressure, doubt HTN emergency.  On exam patient has periumbilical and right lower quadrant tenderness to palpation including tenderness over McBurney's point.  No peritoneal signs.  DDX: Appendicitis, bowel obstruction/perforation, hernia, viral GI illness, cholecystitis, pancreatitis.  Will administer analgesics and antiemetics with lab work and CT abdomen pelvis.  Will scan without contrast secondary to allergy.  Patient CT scan confirms appendicitis without perforation or abscess formation.  Patient has associated white leukocytosis at 13.5 with left shift.  Labs also notable for hyperglycemia at 256- hx of DM.   We will start antibiotics in the emergency department and consult general surgery.  19: 40: RE-EVAL: Patient resting comfortably, feeling improved. We discussed results and plan, provided opportunity for questions- he confirmed understanding.   19:57: CONSULT: Discussed case with general surgeon Dr. [name]- will come to the ED to evaluate the patient.   Patient has been taken to surgery by Dr. [name] and is off ED floor.   Findings and plan of care discussed with supervising physician Dr. Tyrone Nine- in agreement.   Final Clinical Impressions(s) / ED Diagnoses   Final diagnoses:  Acute appendicitis, unspecified acute appendicitis type    ED Discharge Orders    None       Amaryllis Dyke, [name] 10/21/18 2117    Deno Etienne, DO 10/21/18 2132

## 2018-10-22 ENCOUNTER — Encounter (HOSPITAL_COMMUNITY): Payer: Self-pay | Admitting: Surgery

## 2018-10-22 DIAGNOSIS — K6389 Other specified diseases of intestine: Secondary | ICD-10-CM | POA: Diagnosis present

## 2018-10-22 LAB — GLUCOSE, CAPILLARY
GLUCOSE-CAPILLARY: 240 mg/dL — AB (ref 70–99)
GLUCOSE-CAPILLARY: 261 mg/dL — AB (ref 70–99)
GLUCOSE-CAPILLARY: 289 mg/dL — AB (ref 70–99)
Glucose-Capillary: 164 mg/dL — ABNORMAL HIGH (ref 70–99)
Glucose-Capillary: 221 mg/dL — ABNORMAL HIGH (ref 70–99)
Glucose-Capillary: 257 mg/dL — ABNORMAL HIGH (ref 70–99)
Glucose-Capillary: 270 mg/dL — ABNORMAL HIGH (ref 70–99)
Glucose-Capillary: 275 mg/dL — ABNORMAL HIGH (ref 70–99)

## 2018-10-22 LAB — CBC
HCT: 40.6 % (ref 39.0–52.0)
Hemoglobin: 13.5 g/dL (ref 13.0–17.0)
MCH: 29.6 pg (ref 26.0–34.0)
MCHC: 33.3 g/dL (ref 30.0–36.0)
MCV: 89 fL (ref 80.0–100.0)
NRBC: 0 % (ref 0.0–0.2)
PLATELETS: 268 10*3/uL (ref 150–400)
RBC: 4.56 MIL/uL (ref 4.22–5.81)
RDW: 12.1 % (ref 11.5–15.5)
WBC: 16.8 10*3/uL — ABNORMAL HIGH (ref 4.0–10.5)

## 2018-10-22 LAB — BASIC METABOLIC PANEL
Anion gap: 8 (ref 5–15)
BUN: 12 mg/dL (ref 6–20)
CO2: 25 mmol/L (ref 22–32)
CREATININE: 1.21 mg/dL (ref 0.61–1.24)
Calcium: 8.4 mg/dL — ABNORMAL LOW (ref 8.9–10.3)
Chloride: 102 mmol/L (ref 98–111)
GFR calc non Af Amer: >60 mL/min (ref >60)
Glucose, Bld: 251 mg/dL — ABNORMAL HIGH (ref 70–99)
Potassium: 4.6 mmol/L (ref 3.5–5.1)
SODIUM: 135 mmol/L (ref 135–145)

## 2018-10-22 LAB — MAGNESIUM: Magnesium: 1.7 mg/dL (ref 1.7–2.4)

## 2018-10-22 LAB — HEMOGLOBIN A1C
Hgb A1c MFr Bld: 9 % — ABNORMAL HIGH (ref 4.8–5.6)
Mean Plasma Glucose: 211.6 mg/dL

## 2018-10-22 LAB — PHOSPHORUS: Phosphorus: 4.3 mg/dL (ref 2.5–4.6)

## 2018-10-22 MED ORDER — ATORVASTATIN CALCIUM 10 MG PO TABS: 10.0000 mg | ORAL_TABLET | Freq: Every day | ORAL | Status: DC

## 2018-10-22 MED ORDER — DIPHENHYDRAMINE HCL 12.5 MG/5ML PO ELIX: 12.5000 mg | ORAL_SOLUTION | Freq: Four times a day (QID) | ORAL | Status: DC | PRN

## 2018-10-22 MED ORDER — INSULIN ASPART 100 UNIT/ML ~~LOC~~ SOLN
0.0000 [IU] | Freq: Every day | SUBCUTANEOUS | Status: DC
  Administered 2018-10-22: 3 [IU] via SUBCUTANEOUS
  Filled 2018-10-22: qty 0.05

## 2018-10-22 MED ORDER — LIVING WELL WITH DIABETES BOOK - IN SPANISH
Freq: Once | Status: AC
  Administered 2018-10-22: 17:00:00
  Filled 2018-10-22: qty 1

## 2018-10-22 MED ORDER — ACETAMINOPHEN 10 MG/ML IV SOLN
INTRAVENOUS | Status: AC
  Filled 2018-10-22: qty 100

## 2018-10-22 MED ORDER — ONDANSETRON HCL 4 MG/2ML IJ SOLN: 4.0000 mg | Freq: Four times a day (QID) | INTRAMUSCULAR | Status: DC | PRN

## 2018-10-22 MED ORDER — INSULIN ASPART 100 UNIT/ML ~~LOC~~ SOLN
SUBCUTANEOUS | Status: AC
  Filled 2018-10-22: qty 1

## 2018-10-22 MED ORDER — HYDROMORPHONE HCL 1 MG/ML IJ SOLN
INTRAMUSCULAR | Status: AC
  Filled 2018-10-22: qty 1

## 2018-10-22 MED ORDER — METHOCARBAMOL 1000 MG/10ML IJ SOLN: 500.0000 mg | Freq: Three times a day (TID) | INTRAVENOUS | Status: DC

## 2018-10-22 MED ORDER — FENTANYL CITRATE (PF) 100 MCG/2ML IJ SOLN
25.0000 ug | INTRAMUSCULAR | Status: DC | PRN
  Administered 2018-10-22 (×3): 50 ug via INTRAVENOUS

## 2018-10-22 MED ORDER — METHOCARBAMOL 500 MG PO TABS
500.0000 mg | ORAL_TABLET | Freq: Two times a day (BID) | ORAL | Status: DC
  Administered 2018-10-22 (×2): 500 mg via ORAL
  Filled 2018-10-22 (×2): qty 1

## 2018-10-22 MED ORDER — INSULIN ASPART 100 UNIT/ML ~~LOC~~ SOLN
0.0000 [IU] | Freq: Three times a day (TID) | SUBCUTANEOUS | Status: DC
  Administered 2018-10-22: 5 [IU] via SUBCUTANEOUS
  Administered 2018-10-22: 8 [IU] via SUBCUTANEOUS
  Administered 2018-10-22 – 2018-10-23 (×2): 5 [IU] via SUBCUTANEOUS

## 2018-10-22 MED ORDER — IBUPROFEN 600 MG PO TABS: 600.0000 mg | ORAL_TABLET | Freq: Four times a day (QID) | ORAL | Status: DC | PRN

## 2018-10-22 MED ORDER — ATORVASTATIN CALCIUM 10 MG PO TABS
10.0000 mg | ORAL_TABLET | Freq: Every day | ORAL | Status: DC
  Administered 2018-10-25: 10 mg via ORAL
  Filled 2018-10-22: qty 1

## 2018-10-22 MED ORDER — TRAMADOL HCL 50 MG PO TABS
50.0000 mg | ORAL_TABLET | Freq: Four times a day (QID) | ORAL | Status: DC | PRN
  Administered 2018-10-22 – 2018-10-24 (×6): 50 mg via ORAL
  Filled 2018-10-22 (×7): qty 1

## 2018-10-22 MED ORDER — HYDRALAZINE HCL 20 MG/ML IJ SOLN
10.0000 mg | INTRAMUSCULAR | Status: DC | PRN
  Filled 2018-10-22: qty 0.5

## 2018-10-22 MED ORDER — KETOROLAC TROMETHAMINE 30 MG/ML IJ SOLN
30.0000 mg | Freq: Once | INTRAMUSCULAR | Status: AC
  Administered 2018-10-22: 30 mg via INTRAVENOUS

## 2018-10-22 MED ORDER — HYDROMORPHONE HCL 1 MG/ML IJ SOLN
0.5000 mg | INTRAMUSCULAR | Status: DC | PRN
  Administered 2018-10-22 – 2018-10-23 (×8): 0.5 mg via INTRAVENOUS
  Filled 2018-10-22 (×8): qty 1

## 2018-10-22 MED ORDER — LACTATED RINGERS IV SOLN
INTRAVENOUS | Status: DC
  Administered 2018-10-22 (×3): via INTRAVENOUS

## 2018-10-22 MED ORDER — SODIUM CHLORIDE 0.9% FLUSH: 9.0000 mL | INTRAVENOUS | Status: DC | PRN

## 2018-10-22 MED ORDER — ACETAMINOPHEN 500 MG PO TABS
1000.0000 mg | ORAL_TABLET | Freq: Four times a day (QID) | ORAL | Status: DC
  Administered 2018-10-22 – 2018-10-28 (×23): 1000 mg via ORAL
  Filled 2018-10-22 (×27): qty 2

## 2018-10-22 MED ORDER — FENTANYL CITRATE (PF) 100 MCG/2ML IJ SOLN
INTRAMUSCULAR | Status: AC
  Filled 2018-10-22: qty 2

## 2018-10-22 MED ORDER — ALUM & MAG HYDROXIDE-SIMETH 200-200-20 MG/5ML PO SUSP: 30.0000 mL | Freq: Four times a day (QID) | ORAL | Status: DC | PRN

## 2018-10-22 MED ORDER — DOCUSATE SODIUM 100 MG PO CAPS
200.0000 mg | ORAL_CAPSULE | Freq: Two times a day (BID) | ORAL | Status: DC
  Administered 2018-10-22 (×2): 200 mg via ORAL
  Filled 2018-10-22 (×2): qty 2

## 2018-10-22 MED ORDER — KETOROLAC TROMETHAMINE 30 MG/ML IJ SOLN
INTRAMUSCULAR | Status: AC
  Filled 2018-10-22: qty 1

## 2018-10-22 MED ORDER — ONDANSETRON HCL 4 MG PO TABS: 4.0000 mg | ORAL_TABLET | Freq: Four times a day (QID) | ORAL | Status: DC | PRN

## 2018-10-22 MED ORDER — HYDROMORPHONE HCL 1 MG/ML IJ SOLN
0.5000 mg | INTRAMUSCULAR | Status: AC | PRN
  Administered 2018-10-22 (×4): 0.5 mg via INTRAVENOUS

## 2018-10-22 MED ORDER — ASPIRIN EC 81 MG PO TBEC
81.0000 mg | DELAYED_RELEASE_TABLET | Freq: Every day | ORAL | Status: DC
  Administered 2018-10-22 – 2018-10-25 (×4): 81 mg via ORAL
  Filled 2018-10-22 (×4): qty 1

## 2018-10-22 MED ORDER — HYDROMORPHONE 1 MG/ML IV SOLN: INTRAVENOUS | Status: DC

## 2018-10-22 MED ORDER — ACETAMINOPHEN 10 MG/ML IV SOLN: 1000.0000 mg | Freq: Once | INTRAVENOUS | Status: DC

## 2018-10-22 MED ORDER — PROMETHAZINE HCL 25 MG/ML IJ SOLN: 6.2500 mg | INTRAMUSCULAR | Status: DC | PRN

## 2018-10-22 MED ORDER — ENSURE SURGERY PO LIQD
237.0000 mL | Freq: Two times a day (BID) | ORAL | Status: DC
  Administered 2018-10-22 – 2018-10-28 (×10): 237 mL via ORAL
  Filled 2018-10-22 (×14): qty 237

## 2018-10-22 MED ORDER — NALOXONE HCL 0.4 MG/ML IJ SOLN: 0.4000 mg | INTRAMUSCULAR | Status: DC | PRN

## 2018-10-22 MED ORDER — DIPHENHYDRAMINE HCL 50 MG/ML IJ SOLN: 12.5000 mg | Freq: Four times a day (QID) | INTRAMUSCULAR | Status: DC | PRN

## 2018-10-22 MED ORDER — HEPARIN SODIUM (PORCINE) 5000 UNIT/ML IJ SOLN
5000.0000 [IU] | Freq: Three times a day (TID) | INTRAMUSCULAR | Status: DC
  Administered 2018-10-22 – 2018-10-28 (×18): 5000 [IU] via SUBCUTANEOUS
  Filled 2018-10-22 (×18): qty 1

## 2018-10-22 MED ORDER — ACETAMINOPHEN 10 MG/ML IV SOLN
1000.0000 mg | Freq: Once | INTRAVENOUS | Status: AC
  Administered 2018-10-22: 1000 mg via INTRAVENOUS

## 2018-10-22 MED ORDER — INSULIN ASPART 100 UNIT/ML ~~LOC~~ SOLN
5.0000 [IU] | Freq: Once | SUBCUTANEOUS | Status: AC
  Administered 2018-10-22: 5 [IU] via SUBCUTANEOUS

## 2018-10-22 NOTE — Progress Notes (Signed)
OT Cancellation Note  Patient Details Name: Blake Vaughn MRN: [id number] DOB: Sep 17, 1961   Cancelled Treatment:    Reason Eval/Treat Not Completed: OT screened, no needs identified, will sign off.  Per PT, Pt is moving well.  Lucille Passy, OTR/L Acute Rehabilitation Services Pager [phone] Office [phone]   Lucille Passy M 10/22/2018, 12:07 PM

## 2018-10-22 NOTE — Progress Notes (Addendum)
Brief Nutrition Education Note  RD consulted for nutrition education regarding diabetes.    Lab Results  Component Value Date   HGBA1C 9.0 (H) 10/22/2018   PTA DM medications are 1000 mg metformin BID and 10 mg glipizide BID.   Labs reviewed: CBGS: 221-275 (inpatient orders for glycemic control are 0-15 units insulin aspart TID and 0-5 units insulin aspart q HS).   Case discussed with RN, who reports pt speaks and understands basic English, but pt may need interpreter when explaining more advanced concepts.   Per DM coordinator notes, pt consumes sugary beverages and foods high in refined carbohydrates.   Spoke with pt at bedside, who reports he is not feeling well and in a lot of pain. He vaguely remembers DM coordinator visit, but reports feeling "hazy- so many people coming in and out of here I can't remember". Pt recalls telling DM coordinator that he has further questions related to DM, but cannot remember what they are. Offered option to provide education or to have pt read over "Carbohydrate for People with Diabetes" handout from [employer] (in Spanish) at his leisure; pt reports he would like to read the handout when he was feeling better (he shares he cannot currently read the handout because he does not have his glasses). Informed pt that he can contact RD on contact number if he has additional questions or request a return visit when he is feeling better. Pt agreeable to plan.   Body mass index is 36.09 kg/m. Pt meets criteria for obesity, class II based on current BMI.  Current diet order is NPO, patient is consuming approximately n/a% of meals at this time. Labs and medications reviewed. No further nutrition interventions warranted at this time. RD contact information provided. If additional nutrition issues arise, please re-consult RD.  Amahia Madonia A. Jimmye Norman, RD, LDN, CDE Pager: [phone] After hours Pager: [phone]

## 2018-10-22 NOTE — Care Management Note (Signed)
Case Management Note  Patient Details  Name: Blake Vaughn MRN: [id number] Date of Birth: Mar 21, 1961  Subjective/Objective:                    Action/Plan:  Diagnostic laparoscopy  Exploratory laparotomy with right hemicolectomy  10/22/18 NPO will continue to follow for discharge needs. Expected Discharge Date:                  Expected Discharge Plan:  Home/Self Care  In-House Referral:     Discharge planning Services  CM Consult, Medication Assistance, [facility]  Post Acute Care Choice:    Choice offered to:     DME Arranged:    DME Agency:     HH Arranged:    HH Agency:     Status of Service:  In process, will continue to follow  If discussed at Long Length of Stay Meetings, dates discussed:    Additional Comments:  Marilu Favre, RN 10/22/2018, 2:07 PM

## 2018-10-22 NOTE — Progress Notes (Signed)
Pt arrived to 6N A&Ox4; No distress. Able to communicate effectively. VS WNL. Pt asked about his wallet/money and cellular phone. Pt notified that belongings were lock in safe by security and would be able to retrieve with the slip of paper that was left at the front desk.

## 2018-10-22 NOTE — Progress Notes (Signed)
Notified Dr. Kieth Brightly of bleeding on honeycomb dressing verbal order to change dressing if it becomes saturated.

## 2018-10-22 NOTE — Progress Notes (Signed)
Subjective No acute events. Sore as expected - no tenderness elsewhere aside from midline. No n/v. Denies f/bm yet  Objective: Vital signs in last 24 hours: Temp:  [97.5 F (36.4 C)-98.6 F (37 C)] 98.3 F (36.8 C) (11/15 0522) Pulse Rate:  [84-105] 91 (11/15 0522) Resp:  [16-27] 18 (11/15 0522) BP: (124-188)/(69-99) 124/69 (11/15 0522) SpO2:  [92 %-100 %] 99 % (11/15 0522) Weight:  [81.6 kg-89.5 kg] 89.5 kg (11/15 0500)    Intake/Output from previous day: 11/14 0701 - 11/15 0700 In: 2529.5 [I.V.:2029.5; IV Piggyback:500] Out: 375 [Urine:275; Blood:100] Intake/Output this shift: Total I/O In: 2029.5 [I.V.:2029.5] Out: 375 [Urine:275; Blood:100]  Gen: NAD, comfortable CV: RRR Pulm: Normal work of breathing Abd: Soft, ttp around incision in midline; no rebound; no guarding. Not significantly distended Ext: SCDs in place  Lab Results: CBC  Recent Labs    10/21/18 1659 10/22/18 0331  WBC 13.5* 16.8*  HGB 13.7 13.5  HCT 41.9 40.6  PLT 254 268   BMET Recent Labs    10/21/18 1659 10/22/18 0331  NA 135 135  K 4.3 4.6  CL 102 102  CO2 25 25  GLUCOSE 256* 251*  BUN 13 12  CREATININE 0.78 1.21  CALCIUM 9.1 8.4*   PT/INR No results for input(s): LABPROT, INR in the last 72 hours. ABG No results for input(s): PHART, HCO3 in the last 72 hours.  Invalid input(s): PCO2, PO2  Studies/Results:  Anti-infectives: Anti-infectives (From admission, onward)   Start     Dose/Rate Route Frequency Ordered Stop   10/21/18 1945  cefTRIAXone (ROCEPHIN) 2 g in sodium chloride 0.9 % 100 mL IVPB     2 g 200 mL/hr over 30 Minutes Intravenous  Once 10/21/18 1938 10/21/18 2030   10/21/18 1945  metroNIDAZOLE (FLAGYL) IVPB 500 mg     500 mg 100 mL/hr over 60 Minutes Intravenous  Once 10/21/18 1938 10/22/18 0243       Assessment/Plan: Patient Active Problem List   Diagnosis Date Noted  . Colonic mass 10/22/2018  . Appendicitis 10/21/2018  . Nocturnal leg cramps 07/16/2017   . FOOT PAIN, BILATERAL 11/05/2009  . Pure hypercholesterolemia 09/24/2009  . 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 TOBACCO USE, QUIT 08/18/2009  . Diabetes type 2, uncontrolled ([place]) 07/09/2009  . 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 Will await final pathology. He expressed understanding   LOS: 1 day   [name]. [name], M.D. [facility]

## 2018-10-22 NOTE — Progress Notes (Signed)
1 Day Post-Op    CC: abdominal pain  Subjective: He is having some pain has been out of bed so far.  Dressing is dry.  He has a few bowel sounds.  He is not aware of any flatus.  Objective: Vital signs in last 24 hours: Temp:  [97.5 F (36.4 C)-98.6 F (37 C)] 98.3 F (36.8 C) (11/15 0522) Pulse Rate:  [84-105] 91 (11/15 0522) Resp:  [16-27] 18 (11/15 0522) BP: (124-188)/(69-99) 124/69 (11/15 0522) SpO2:  [92 %-100 %] 99 % (11/15 0522) Weight:  [81.6 kg-89.5 kg] 89.5 kg (11/15 0500)  IV fluids: 2529 Urine output 275 listed Afebrile some tachycardia improving. Sats 99% on nasal cannula Glucose 251 creatinine 0.78 >> 1.21 this a.m. Glucose is all greater than 200 K+ 4.6 WBC 16.8 H/H 40.6/13.5 Platelets 268K CT scan 10/21/2018: Dilated 15 mm appendix fluid-filled periappendiceal stranding compatible with acute appendicitis.  No abscess or appendicolith identified.  No evidence of perforation.  Intake/Output from previous day: 11/14 0701 - 11/15 0700 In: 2529.5 [I.V.:2029.5; IV Piggyback:500] Out: 375 [Urine:275; Blood:100] Intake/Output this shift: No intake/output data recorded.  General appearance: alert, cooperative and no distress Resp: clear to auscultation bilaterally and Move about 1250 on the incentive spirometer. GI: Distended few hyperactive bowel sounds.  No flatus.  Dressing is dry.  Lab Results:  Recent Labs    10/21/18 1659 10/22/18 0331  WBC 13.5* 16.8*  HGB 13.7 13.5  HCT 41.9 40.6  PLT 254 268    BMET Recent Labs    10/21/18 1659 10/22/18 0331  NA 135 135  K 4.3 4.6  CL 102 102  CO2 25 25  GLUCOSE 256* 251*  BUN 13 12  CREATININE 0.78 1.21  CALCIUM 9.1 8.4*   PT/INR No results for input(s): LABPROT, INR in the last 72 hours.  Recent Labs  Lab 10/21/18 1659  AST 12*  ALT 12  ALKPHOS 63  BILITOT 0.5  PROT 5.7*  ALBUMIN 2.9*     Lipase     Component Value Date/Time   LIPASE 33 10/21/2018 1659   Prior to Admission  medications   Medication Sig Start Date End Date Taking? Authorizing Provider  traMADol (ULTRAM) 50 MG tablet Take 1 tablet (50 mg total) by mouth every 8 (eight) hours as needed for moderate pain. 04/02/16  Yes Zenia Resides, MD  Alcohol Swabs PADS Use as instructed 09/05/16   Schorr, [name], NP  aspirin EC 81 MG tablet Take 1 tablet (81 mg total) by mouth daily. 07/16/17   [name], [name], DO  atorvastatin (LIPITOR) 10 MG tablet Take 1 tablet (10 mg total) by mouth daily. 07/16/17   [name], [name], DO  Blood Glucose Monitoring Suppl (RELION PRIME MONITOR) DEVI Use device as instructed 04/14/16   [name], DO  diphenhydrAMINE (BENADRYL) 25 MG tablet Take 50 mg (2 tabs) PO 1 hour prior to contrast media. 09/08/16   Susanne Borders, NP  glipiZIDE (GLUCOTROL) 10 MG tablet TAKE 1 TABLET BY MOUTH TWICE DAILY BEFORE MEAL(S) 06/07/18   [name], [name], DO  glucose blood test strip Use as instructed 04/14/16   [name], DO  metFORMIN (GLUCOPHAGE) 1000 MG tablet Take 1 tablet (1,000 mg total) by mouth 2 (two) times daily with a meal. 07/16/17   Enid Derry, [name], DO  methocarbamol (ROBAXIN) 500 MG tablet Take 1 tablet (500 mg total) by mouth 2 (two) times daily. 10/17/18   Khatri, [place], [name]  naproxen (NAPROSYN) 500 MG tablet Take 1  tablet (500 mg total) by mouth 2 (two) times daily with a meal. 10/10/18 10/10/19  Rosemarie Ax, MD  RELION LANCETS STANDARD 21G MISC Use as instructed 04/14/16   [name], DO      Medications: . acetaminophen  1,000 mg Oral Q6H  . aspirin EC  81 mg Oral Daily  . atorvastatin  10 mg Oral Daily  . docusate sodium  200 mg Oral BID  . feeding supplement  237 mL Oral BID BM  . fentaNYL      . fentaNYL      . heparin injection (subcutaneous)  5,000 Units Subcutaneous Q8H  . HYDROmorphone      . HYDROmorphone      . insulin aspart  0-15 Units Subcutaneous TID WC  . insulin aspart  0-5 Units Subcutaneous QHS  . ketorolac      . methocarbamol  500 mg Oral  BID   . acetaminophen    . 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 Nadeen Landau  FEN: IV fluids/n.p.o. ID: Flagyl/Rocephin preop DVT: Heparin Follow-up: Dr. [name]   Plan: Continue IV pain meds, continue IV Tylenol, add Robaxin.  His creatinines up some again increase his IV fluids slightly.  Hemoglobin A1c and diabetes coordinator to assist with diabetic control.  He is unsure of the hypertension issue.  He can do the incentive spirometry.  Begin to mobilize.  Foley is out.    LOS: 1 day    Clarissia Mckeen 10/22/2018 [phone]

## 2018-10-22 NOTE — Anesthesia Postprocedure Evaluation (Signed)
Anesthesia Post Note  Patient: EPIFANIO LABRADOR  Procedure(s) Performed: Exploratory laparotomy right hemicolectomy (N/A Abdomen)     Patient location during evaluation: PACU Anesthesia Type: General Level of consciousness: awake and alert, awake and oriented Pain management: pain level controlled Vital Signs Assessment: post-procedure vital signs reviewed and stable Respiratory status: spontaneous breathing, nonlabored ventilation and respiratory function stable Cardiovascular status: blood pressure returned to baseline and stable Postop Assessment: no apparent nausea or vomiting Anesthetic complications: no    Last Vitals:  Vitals:   10/22/18 0214 10/22/18 0522  BP: 132/76 124/69  Pulse: (!) 105 91  Resp: 17 18  Temp: 36.8 C 36.8 C  SpO2: 97% 99%    Last Pain:  Vitals:   10/22/18 0522  TempSrc: Oral  PainSc:                  Catalina Gravel

## 2018-10-22 NOTE — Evaluation (Signed)
Physical Therapy Evaluation & Discharge Patient Details Name: Blake Vaughn MRN: [id number] DOB: August 06, 1961 Today's Date: 10/22/2018   History of Present Illness  Pt is a 57 y.o. male admitted 31/54/00 with periumbilical abdominal pain; now s/p exploratory laparotomy with right hemicolectomy on 11/14. PMH includes HTN, DM, lymphoma.    Clinical Impression  Patient evaluated by Physical Therapy with no further acute PT needs identified. PTA, pt indep and lives with son. Today, pt indep with transfers and ambulation. Educ on abdominal precautions and bracing techniques. All education has been completed and the patient has no further questions. Encouraged frequent OOB mobility and ambulation during hospital admission. Acute PT is signing off. Thank you for this referral.    Follow Up Recommendations No PT follow up    Equipment Recommendations  None recommended by PT    Recommendations for Other Services       Precautions / Restrictions Precautions Precaution Comments: s/p abdominal sx Restrictions Weight Bearing Restrictions: No      Mobility  Bed Mobility Overal bed mobility: Needs Assistance Bed Mobility: Rolling;Sidelying to Sit Rolling: Modified independent (Device/Increase time) Sidelying to sit: Min assist       General bed mobility comments: Pt immediately attempting to power up into long sitting, but unable secondary to abdominal pain. Educ on log roll technique; minA to assist trunk elevation from sidelying  Transfers Overall transfer level: Independent Equipment used: None                Ambulation/Gait Ambulation/Gait assistance: Modified independent (Device/Increase time) Gait Distance (Feet): 200 Feet Assistive device: IV Pole;None Gait Pattern/deviations: Step-through pattern;Decreased stride length;Antalgic Gait velocity: Decreased Gait velocity interpretation: 1.31 - 2.62 ft/sec, indicative of limited community ambulator General Gait Details:  Slow, antalgic amb indep in room; mod indep pushing IV pole in hallway. Intermittent standing rest breaks secondary to pain  Stairs            Wheelchair Mobility    Modified Rankin (Stroke Patients Only)       Balance Overall balance assessment: No apparent balance deficits (not formally assessed)                                           Pertinent Vitals/Pain Pain Assessment: Faces Faces Pain Scale: Hurts even more Pain Location: Abdominal incision Pain Descriptors / Indicators: Sore Pain Intervention(s): Limited activity within patient's tolerance;Monitored during session    Home Living Family/patient expects to be discharged to:: Private residence Living Arrangements: Children Available Help at Discharge: Family;Available PRN/intermittently Type of Home: House Home Access: Stairs to enter Entrance Stairs-Rails: Right Entrance Stairs-Number of Steps: 5 Home Layout: One level Home Equipment: None      Prior Function Level of Independence: Independent         Comments: Not working. Drives     Hand Dominance        Extremity/Trunk Assessment   Upper Extremity Assessment Upper Extremity Assessment: Overall WFL for tasks assessed    Lower Extremity Assessment Lower Extremity Assessment: Overall WFL for tasks assessed       Communication   Communication: Prefers language other than [place];Interpreter utilized(iPad interpreter Lenell Antu [phone])  Cognition Arousal/Alertness: Awake/alert Behavior During Therapy: WFL for tasks assessed/performed Overall Cognitive Status: Within Functional Limits for tasks assessed  General Comments: Via Spanish interpreter      General Comments General comments (skin integrity, edema, etc.): Brother present during session    Exercises     Assessment/Plan    PT Assessment Patent does not need any further PT services  PT Problem List         PT  Treatment Interventions      PT Goals (Current goals can be found in the Care Plan section)  Acute Rehab PT Goals PT Goal Formulation: All assessment and education complete, DC therapy    Frequency     Barriers to discharge        Co-evaluation               AM-PAC PT "6 Clicks" Daily Activity  Outcome Measure Difficulty turning over in bed (including adjusting bedclothes, sheets and blankets)?: None Difficulty moving from lying on back to sitting on the side of the bed? : Unable Difficulty sitting down on and standing up from a chair with arms (e.g., wheelchair, bedside commode, etc,.)?: None Help needed moving to and from a bed to chair (including a wheelchair)?: None Help needed walking in hospital room?: None Help needed climbing 3-5 steps with a railing? : A Little 6 Click Score: 20    End of Session Equipment Utilized During Treatment: Gait belt Activity Tolerance: Patient tolerated treatment well Patient left: in chair;with call bell/phone within reach;with family/visitor present Nurse Communication: Mobility status PT Visit Diagnosis: Other abnormalities of gait and mobility (R26.89)    Time: 2595-6387 PT Time Calculation (min) (ACUTE ONLY): 20 min   Charges:   PT Evaluation $PT Eval Moderate Complexity: [place], PT, DPT Acute Rehabilitation Services  Pager [phone] Office [place] 10/22/2018, 10:58 AM

## 2018-10-22 NOTE — Progress Notes (Addendum)
Inpatient Diabetes Program Recommendations  AACE/ADA: New Consensus Statement on Inpatient Glycemic Control (2015)  Target Ranges:  Prepandial:   less than 140 mg/dL      Peak postprandial:   less than 180 mg/dL (1-2 hours)      Critically ill patients:  140 - 180 mg/dL   Lab Results  Component Value Date   GLUCAP 221 (H) 10/22/2018   HGBA1C 9.0 (H) 10/22/2018    Review of Glycemic Control Results for Blake Vaughn, Blake Vaughn (MRN [id number]) as of 10/22/2018 10:27  Ref. Range 10/22/2018 00:35 10/22/2018 01:36 10/22/2018 07:47  Glucose-Capillary Latest Ref Range: 70 - 99 mg/dL 270 (H) 261 (H) 221 (H)   Diabetes history: Type 2 DM Outpatient Diabetes medications: Metformin 1000 mg BID, Glipizide 10 mg BID Current orders for Inpatient glycemic control: Novolog 0-15 units TId, Novolog 0-5 units QHS  Inpatient Diabetes Program Recommendations:    Noted consult, will plan to see patient today.   While inpatient, consider adding basal insulin. Consider Lantus 10 units QD.  Addendum@1400 : Spoke to patient at length regarding diabetes management, using interpreters. Patient confirmed he has been taking oral antidiabetic medications.  Reviewed patient's current A1c of 9%. Explained what a A1c is and what it measures. Also reviewed goal A1c with patient, importance of good glucose control @ home, and blood sugar goals. Reviewed patho of DM, need for insulin, role of pancreas, comorbidites associated and vascular changes that occur.  Patient checks blood sugar once per week. Admits to drinking large amounts of tea and foods high in carbohydrates. Encouraged to begin checking 2-3 times per day and writing down food intake to take with him to next PCP visit. Reviewed foods/beverages that are higher in carbohydrates and encouraged to begin being mindful of intake. Reviewed carb counting, nutritional labels and foods that were better choices. Will place consult for dietitian.  Patient encouraged to follow  up with PCP regarding DM. Has no further questions at this time.   Thanks, Bronson Curb, MSN, RNC-OB Diabetes Coordinator [phone] (8a-5p)

## 2018-10-23 DIAGNOSIS — Z789 Other specified health status: Secondary | ICD-10-CM

## 2018-10-23 DIAGNOSIS — E669 Obesity, unspecified: Secondary | ICD-10-CM

## 2018-10-23 LAB — BASIC METABOLIC PANEL
Anion gap: 8 (ref 5–15)
BUN: 8 mg/dL (ref 6–20)
CO2: 25 mmol/L (ref 22–32)
CREATININE: 0.89 mg/dL (ref 0.61–1.24)
Calcium: 8.6 mg/dL — ABNORMAL LOW (ref 8.9–10.3)
Chloride: 101 mmol/L (ref 98–111)
GFR calc Af Amer: >60 mL/min (ref >60)
Glucose, Bld: 224 mg/dL — ABNORMAL HIGH (ref 70–99)
Potassium: 4.1 mmol/L (ref 3.5–5.1)
SODIUM: 134 mmol/L — AB (ref 135–145)

## 2018-10-23 LAB — CBC
HCT: 37.9 % — ABNORMAL LOW (ref 39.0–52.0)
Hemoglobin: 12.6 g/dL — ABNORMAL LOW (ref 13.0–17.0)
MCH: 29.7 pg (ref 26.0–34.0)
MCHC: 33.2 g/dL (ref 30.0–36.0)
MCV: 89.4 fL (ref 80.0–100.0)
PLATELETS: 236 10*3/uL (ref 150–400)
RBC: 4.24 MIL/uL (ref 4.22–5.81)
RDW: 12.2 % (ref 11.5–15.5)
WBC: 14.4 10*3/uL — ABNORMAL HIGH (ref 4.0–10.5)
nRBC: 0 % (ref 0.0–0.2)

## 2018-10-23 LAB — GLUCOSE, CAPILLARY
GLUCOSE-CAPILLARY: 235 mg/dL — AB (ref 70–99)
GLUCOSE-CAPILLARY: 156 mg/dL — AB (ref 70–99)
GLUCOSE-CAPILLARY: 125 mg/dL — AB (ref 70–99)
Glucose-Capillary: 240 mg/dL — ABNORMAL HIGH (ref 70–99)

## 2018-10-23 MED ORDER — METOPROLOL TARTRATE 12.5 MG HALF TABLET
12.5000 mg | ORAL_TABLET | Freq: Two times a day (BID) | ORAL | Status: DC
  Administered 2018-10-23 – 2018-10-28 (×11): 12.5 mg via ORAL
  Filled 2018-10-23 (×11): qty 1

## 2018-10-23 MED ORDER — INSULIN ASPART 100 UNIT/ML ~~LOC~~ SOLN
0.0000 [IU] | Freq: Three times a day (TID) | SUBCUTANEOUS | Status: DC
  Administered 2018-10-23: 7 [IU] via SUBCUTANEOUS
  Administered 2018-10-23: 4 [IU] via SUBCUTANEOUS
  Administered 2018-10-24: 3 [IU] via SUBCUTANEOUS
  Administered 2018-10-24: 11 [IU] via SUBCUTANEOUS
  Administered 2018-10-25 (×2): 4 [IU] via SUBCUTANEOUS
  Administered 2018-10-26 (×2): 7 [IU] via SUBCUTANEOUS
  Administered 2018-10-26 – 2018-10-27 (×2): 4 [IU] via SUBCUTANEOUS
  Administered 2018-10-27: 5 [IU] via SUBCUTANEOUS
  Administered 2018-10-28: 4 [IU] via SUBCUTANEOUS

## 2018-10-23 MED ORDER — METOPROLOL TARTRATE 5 MG/5ML IV SOLN: 5.0000 mg | Freq: Four times a day (QID) | INTRAVENOUS | Status: DC | PRN

## 2018-10-23 MED ORDER — SIMETHICONE 40 MG/0.6ML PO SUSP
40.0000 mg | Freq: Four times a day (QID) | ORAL | Status: AC
  Administered 2018-10-23 – 2018-10-24 (×8): 40 mg via ORAL
  Filled 2018-10-23 (×8): qty 0.6

## 2018-10-23 MED ORDER — METHOCARBAMOL 1000 MG/10ML IJ SOLN
1000.0000 mg | Freq: Four times a day (QID) | INTRAVENOUS | Status: DC | PRN
  Administered 2018-10-23: 1000 mg via INTRAVENOUS
  Filled 2018-10-23 (×2): qty 10

## 2018-10-23 MED ORDER — HYDROMORPHONE HCL 1 MG/ML IJ SOLN
0.5000 mg | INTRAMUSCULAR | Status: DC | PRN
  Administered 2018-10-23 (×2): 1 mg via INTRAVENOUS
  Administered 2018-10-24 – 2018-10-25 (×3): 2 mg via INTRAVENOUS
  Filled 2018-10-23: qty 1
  Filled 2018-10-23: qty 2
  Filled 2018-10-23 (×3): qty 1
  Filled 2018-10-23 (×2): qty 2

## 2018-10-23 MED ORDER — GLIPIZIDE 5 MG PO TABS
5.0000 mg | ORAL_TABLET | Freq: Two times a day (BID) | ORAL | Status: DC
  Administered 2018-10-23 – 2018-10-28 (×10): 5 mg via ORAL
  Filled 2018-10-23 (×10): qty 1

## 2018-10-23 MED ORDER — GABAPENTIN 300 MG PO CAPS
300.0000 mg | ORAL_CAPSULE | Freq: Two times a day (BID) | ORAL | Status: DC
  Administered 2018-10-23 (×2): 300 mg via ORAL
  Filled 2018-10-23 (×2): qty 1

## 2018-10-23 MED ORDER — METHOCARBAMOL 500 MG PO TABS: 1000.0000 mg | ORAL_TABLET | Freq: Three times a day (TID) | ORAL | Status: DC

## 2018-10-23 MED ORDER — SACCHAROMYCES BOULARDII 250 MG PO CAPS
250.0000 mg | ORAL_CAPSULE | Freq: Two times a day (BID) | ORAL | Status: DC
  Administered 2018-10-23 – 2018-10-28 (×11): 250 mg via ORAL
  Filled 2018-10-23 (×11): qty 1

## 2018-10-23 MED ORDER — INSULIN ASPART 100 UNIT/ML ~~LOC~~ SOLN
0.0000 [IU] | Freq: Every day | SUBCUTANEOUS | Status: DC
  Administered 2018-10-24: 5 [IU] via SUBCUTANEOUS

## 2018-10-23 MED ORDER — HYDRALAZINE HCL 20 MG/ML IJ SOLN: 5.0000 mg | INTRAMUSCULAR | Status: DC | PRN

## 2018-10-23 NOTE — Progress Notes (Signed)
Blake Vaughn [id number] 06-07-1961  CARE TEAM:  PCP: [name], [name], DO  Outpatient Care Team: Patient Care Team: [name], [name], DO as PCP - General  Inpatient Treatment Team: Treatment Team: Attending Provider: Nolon Nations, MD; Consulting Physician: Edison Pace Md, MD; Registered Nurse: Wayna Chalet, RN; Technician: Gasper Sells, NT; Registered Nurse: Dagoberto Ligas, RN   Problem List:   Principal Problem:   Mass of cecum s/p right colectomy 10/21/2018 Active Problems:   Diabetes type 2, uncontrolled ([place])   Pure hypercholesterolemia   Appendicitis   Uses Spanish as primary spoken language   Obesity (BMI 30-39.9)   2 Days Post-Op  10/21/2018  PRE-OPERATIVE DIAGNOSIS:  Acute appendicitis  POST-OPERATIVE DIAGNOSIS:  Cecal mass; 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 Exploratory laparotomy with right hemicolectomy  SURGEON:  Nadeen Landau, MD  Assessment  [facility]  [facility] Stay = 2 days)  Plan:   FEN: Switch to the LR only with the hyperglycemia.  Clear liquid diet for now.  Advance to full liquids as tolerated  ID: Flagyl/Rocephin preop DM - inc to resistant SSI.  Restart glipizide HTN control DVT: Heparin, SCD Mobilize as tolerated to help recovery  -f/u pathology Follow-up: Dr. [name]      20 minutes spent in review, evaluation, examination, counseling, and coordination of care.  More than 50% of that time was spent in counseling.  10/23/2018    Subjective: (Chief complaint)  Sitting in bed.  Friend at bedside.  Some abdominal cramping.  No nausea or vomiting.  IV pain medicine helps.  Tolerated few sips.  Walking in room.  Objective:  Vital signs:  Vitals:   10/22/18 1803 10/22/18 2118 10/23/18 0456 10/23/18 0500  BP: 140/73 139/79 (!) 181/83   Pulse: 98 92 88   Resp: 18 16 (!) 24   Temp: 99.2 F (37.3 C) 98.2 F (36.8 C) 98.6 F (37 C)   TempSrc: Oral Oral Oral     SpO2: 95% 94% 95%   Weight:    90.7 kg  Height:        Last BM Date: 10/21/18  Intake/Output   Yesterday:  11/15 0701 - 11/16 0700 In: 0  Out: 350 [Urine:350] This shift:  No intake/output data recorded.  Bowel function:  Flatus: Maybe?  BM:  No  Drain: (No drain)   Physical Exam:  General: Pt awake/alert/oriented x4 in no acute distress Eyes: PERRL, normal EOM.  Sclera clear.  No icterus Neuro: CN II-XII intact w/o focal sensory/motor deficits. Lymph: No head/neck/groin lymphadenopathy Psych:  No delerium/psychosis/paranoia HENT: Normocephalic, Mucus membranes moist.  No thrush Neck: Supple, No tracheal deviation Chest: No chest wall pain w good excursion CV:  Pulses intact.  Regular rhythm MS: Normal AROM mjr joints.  No obvious deformity  Abdomen: Soft.  Moderately distended.  Mildly tender at incisions only.  No evidence of peritonitis.  No incarcerated hernias.  Ext:  No deformity.  No mjr edema.  No cyanosis Skin: No petechiae / purpura  Results:   Labs: Results for orders placed or performed during the hospital encounter of 10/21/18 (from the past 48 hour(s))  CBC with Differential     Status: Abnormal   Collection Time: 10/21/18  4:59 PM  Result Value Ref Range   WBC 13.5 (H) 4.0 - 10.5 K/uL   RBC 4.74 4.22 - 5.81 MIL/uL   Hemoglobin 13.7 13.0 - 17.0 g/dL   HCT 41.9 39.0 - 52.0 %   MCV 88.4  80.0 - 100.0 fL   MCH 28.9 26.0 - 34.0 pg   MCHC 32.7 30.0 - 36.0 g/dL   RDW 11.9 11.5 - 15.5 %   Platelets 254 150 - 400 K/uL   nRBC 0.0 0.0 - 0.2 %   Neutrophils Relative % 73 %   Neutro Abs 9.8 (H) 1.7 - 7.7 K/uL   Lymphocytes Relative 16 %   Lymphs Abs 2.2 0.7 - 4.0 K/uL   Monocytes Relative 8 %   Monocytes Absolute 1.1 (H) 0.1 - 1.0 K/uL   Eosinophils Relative 3 %   Eosinophils Absolute 0.4 0.0 - 0.5 K/uL   Basophils Relative 0 %   Basophils Absolute 0.0 0.0 - 0.1 K/uL   Immature Granulocytes 0 %   Abs Immature Granulocytes 0.06 0.00 - 0.07 K/uL     Comment: Performed at [facility] [address], [place], [place] [address]  Comprehensive metabolic panel     Status: Abnormal   Collection Time: 10/21/18  4:59 PM  Result Value Ref Range   Sodium 135 135 - 145 mmol/L   Potassium 4.3 3.5 - 5.1 mmol/L   Chloride 102 98 - 111 mmol/L   CO2 25 22 - 32 mmol/L   Glucose, Bld 256 (H) 70 - 99 mg/dL   BUN 13 6 - 20 mg/dL   Creatinine, Ser 0.78 0.61 - 1.24 mg/dL   Calcium 9.1 8.9 - 10.3 mg/dL   Total Protein 5.7 (L) 6.5 - 8.1 g/dL   Albumin 2.9 (L) 3.5 - 5.0 g/dL   AST 12 (L) 15 - 41 U/L   ALT 12 0 - 44 U/L   Alkaline Phosphatase 63 38 - 126 U/L   Total Bilirubin 0.5 0.3 - 1.2 mg/dL   GFR calc non Af Amer >60 >60 mL/min   GFR calc Af Amer >60 >60 mL/min    Comment: (NOTE) The eGFR has been calculated using the CKD EPI equation. This calculation has not been validated in all clinical situations. eGFR's persistently <60 mL/min signify possible Chronic Kidney Disease.    Anion gap 8 5 - 15    Comment: Performed at [facility] [address] [place], [place] [address]  Lipase, blood     Status: None   Collection Time: 10/21/18  4:59 PM  Result Value Ref Range   Lipase 33 11 - 51 U/L    Comment: Performed at [facility] [address], [place], [place] [address]  Urinalysis, Routine w reflex microscopic     Status: Abnormal   Collection Time: 10/21/18  4:59 PM  Result Value Ref Range   Color, Urine YELLOW YELLOW   APPearance CLEAR CLEAR   Specific Gravity, Urine 1.013 1.005 - 1.030   pH 6.0 5.0 - 8.0   Glucose, UA >=500 (A) NEGATIVE mg/dL   Hgb urine dipstick NEGATIVE NEGATIVE   Bilirubin Urine NEGATIVE NEGATIVE   Ketones, ur NEGATIVE NEGATIVE mg/dL   Protein, ur 100 (A) NEGATIVE mg/dL   Nitrite NEGATIVE NEGATIVE   Leukocytes, UA NEGATIVE NEGATIVE   RBC / HPF 0-5 0 - 5 RBC/hpf   WBC, UA 0-5 0 - 5 WBC/hpf   Bacteria, UA NONE SEEN NONE SEEN   Squamous Epithelial / LPF 0-5 0 - 5    Comment: Performed at  [facility], [place] [address], [place], [place] [address]  Glucose, capillary     Status: Abnormal   Collection Time: 10/21/18 11:59 PM  Result Value Ref  Range   Glucose-Capillary 257 (H) 70 - 99 mg/dL  Glucose, capillary     Status: Abnormal   Collection Time: 10/22/18 12:21 AM  Result Value Ref Range   Glucose-Capillary 289 (H) 70 - 99 mg/dL  Glucose, capillary     Status: Abnormal   Collection Time: 10/22/18 12:35 AM  Result Value Ref Range   Glucose-Capillary 270 (H) 70 - 99 mg/dL  Glucose, capillary     Status: Abnormal   Collection Time: 10/22/18  1:36 AM  Result Value Ref Range   Glucose-Capillary 261 (H) 70 - 99 mg/dL  Basic metabolic panel     Status: Abnormal   Collection Time: 10/22/18  3:31 AM  Result Value Ref Range   Sodium 135 135 - 145 mmol/L   Potassium 4.6 3.5 - 5.1 mmol/L   Chloride 102 98 - 111 mmol/L   CO2 25 22 - 32 mmol/L   Glucose, Bld 251 (H) 70 - 99 mg/dL   BUN 12 6 - 20 mg/dL   Creatinine, Ser 1.21 0.61 - 1.24 mg/dL   Calcium 8.4 (L) 8.9 - 10.3 mg/dL   GFR calc non Af Amer >60 >60 mL/min   GFR calc Af Amer >60 >60 mL/min    Comment: (NOTE) The eGFR has been calculated using the CKD EPI equation. This calculation has not been validated in all clinical situations. eGFR's persistently <60 mL/min signify possible Chronic Kidney Disease.    Anion gap 8 5 - 15    Comment: Performed at [facility] [address], [place], [place] [address]  CBC     Status: Abnormal   Collection Time: 10/22/18  3:31 AM  Result Value Ref Range   WBC 16.8 (H) 4.0 - 10.5 K/uL   RBC 4.56 4.22 - 5.81 MIL/uL   Hemoglobin 13.5 13.0 - 17.0 g/dL   HCT 40.6 39.0 - 52.0 %   MCV 89.0 80.0 - 100.0 fL   MCH 29.6 26.0 - 34.0 pg   MCHC 33.3 30.0 - 36.0 g/dL   RDW 12.1 11.5 - 15.5 %   Platelets 268 150 - 400 K/uL   nRBC 0.0 0.0 - 0.2 %    Comment: Performed at [facility], [place] [address], [place], [place] [address]  Magnesium     Status: None   Collection  Time: 10/22/18  3:31 AM  Result Value Ref Range   Magnesium 1.7 1.7 - 2.4 mg/dL    Comment: Performed at [facility] [address] [place], [place] [address]  Phosphorus     Status: None   Collection Time: 10/22/18  3:31 AM  Result Value Ref Range   Phosphorus 4.3 2.5 - 4.6 mg/dL    Comment: Performed at [facility] [address], [place], [place] [address]  Hemoglobin A1c     Status: Abnormal   Collection Time: 10/22/18  3:31 AM  Result Value Ref Range   Hgb A1c MFr Bld 9.0 (H) 4.8 - 5.6 %    Comment: (NOTE) Pre diabetes:          5.7%-6.4% Diabetes:              >6.4% Glycemic control for   <7.0% adults with diabetes    Mean Plasma Glucose 211.6 mg/dL    Comment: Performed at [facility] [address] [place], [place] [address]  Glucose, capillary     Status: Abnormal   Collection Time: 10/22/18  7:47 AM  Result Value Ref Range   Glucose-Capillary 221 (H) 70 - 99 mg/dL  Glucose, capillary     Status: Abnormal   Collection Time: 10/22/18 12:03 PM  Result Value Ref Range   Glucose-Capillary 275 (H) 70 - 99 mg/dL  Glucose, capillary     Status: Abnormal   Collection Time: 10/22/18  5:05 PM  Result Value Ref Range   Glucose-Capillary 240 (H) 70 - 99 mg/dL  Glucose, capillary     Status: Abnormal   Collection Time: 10/22/18  9:15 PM  Result Value Ref Range   Glucose-Capillary 164 (H) 70 - 99 mg/dL  CBC     Status: Abnormal   Collection Time: 10/23/18  5:44 AM  Result Value Ref Range   WBC 14.4 (H) 4.0 - 10.5 K/uL   RBC 4.24 4.22 - 5.81 MIL/uL   Hemoglobin 12.6 (L) 13.0 - 17.0 g/dL   HCT 37.9 (L) 39.0 - 52.0 %   MCV 89.4 80.0 - 100.0 fL   MCH 29.7 26.0 - 34.0 pg   MCHC 33.2 30.0 - 36.0 g/dL   RDW 12.2 11.5 - 15.5 %   Platelets 236 150 - 400 K/uL   nRBC 0.0 0.0 - 0.2 %    Comment: Performed at [facility], [place] [address], [place], [place] [address]  Basic metabolic panel     Status: Abnormal   Collection Time: 10/23/18  5:44 AM    Result Value Ref Range   Sodium 134 (L) 135 - 145 mmol/L   Potassium 4.1 3.5 - 5.1 mmol/L   Chloride 101 98 - 111 mmol/L   CO2 25 22 - 32 mmol/L   Glucose, Bld 224 (H) 70 - 99 mg/dL   BUN 8 6 - 20 mg/dL   Creatinine, Ser 0.89 0.61 - 1.24 mg/dL   Calcium 8.6 (L) 8.9 - 10.3 mg/dL   GFR calc non Af Amer >60 >60 mL/min   GFR calc Af Amer >60 >60 mL/min    Comment: (NOTE) The eGFR has been calculated using the CKD EPI equation. This calculation has not been validated in all clinical situations. eGFR's persistently <60 mL/min signify possible Chronic Kidney Disease.    Anion gap 8 5 - 15    Comment: Performed at [facility] [address] [place], [place] [address]  Glucose, capillary     Status: Abnormal   Collection Time: 10/23/18  8:14 AM  Result Value Ref Range   Glucose-Capillary 235 (H) 70 - 99 mg/dL    Imaging / Studies: Ct Abdomen Pelvis Wo Contrast  Result Date: 10/21/2018 CLINICAL DATA:  Abdominal pain.  Suspect appendicitis EXAM: CT ABDOMEN AND PELVIS WITHOUT CONTRAST TECHNIQUE: Multidetector CT imaging of the abdomen and pelvis was performed following the standard protocol without IV contrast. COMPARISON:  CT abdomen pelvis 09/09/2016 FINDINGS: Lower chest: Calcified granuloma right middle lobe. Lung bases clear. Hepatobiliary: No focal liver abnormality is seen. No gallstones, gallbladder wall thickening, or biliary dilatation. Pancreas: Negative Spleen: Negative Adrenals/Urinary Tract: Adrenal glands are unremarkable. Kidneys are normal, without renal calculi, focal lesion, or hydronephrosis. Bladder is unremarkable. 1 cm left upper pole renal cyst unchanged from the prior study. Stomach/Bowel: Normal stomach.  Negative for bowel obstruction. Dilated appendix 15 mm. Fluid-filled appendix with periappendiceal stranding compatible with acute appendicitis. No abscess or appendicoliths identified. Vascular/Lymphatic: Mild atherosclerotic disease in the aorta. Negative for  lymphadenopathy. Reproductive: Prostate enlargement. Other: No free fluid or free air. Musculoskeletal: No acute skeletal abnormality. IMPRESSION: Acute appendicitis  without abscess or appendicoliths. No evidence of perforation. Electronically Signed   By: Franchot Gallo M.D.   On: 10/21/2018 19:29    Medications / Allergies: per chart  Antibiotics: Anti-infectives (From admission, onward)   Start     Dose/Rate Route Frequency Ordered Stop   10/21/18 1945  cefTRIAXone (ROCEPHIN) 2 g in sodium chloride 0.9 % 100 mL IVPB     2 g 200 mL/hr over 30 Minutes Intravenous  Once 10/21/18 1938 10/21/18 2030   10/21/18 1945  metroNIDAZOLE (FLAGYL) IVPB 500 mg     500 mg 100 mL/hr over 60 Minutes Intravenous  Once 10/21/18 1938 10/22/18 0243        Note: Portions of this report may have been transcribed using voice recognition software. Every effort was made to ensure accuracy; however, inadvertent computerized transcription errors may be present.   Any transcriptional errors that result from this process are unintentional.     Adin Hector, MD, FACS, MASCRS Gastrointestinal and Minimally Invasive Surgery    [address], [place], [place] [address] [phone] Main / Paging [phone] Fax

## 2018-10-24 LAB — GLUCOSE, CAPILLARY
GLUCOSE-CAPILLARY: 133 mg/dL — AB (ref 70–99)
GLUCOSE-CAPILLARY: 257 mg/dL — AB (ref 70–99)
Glucose-Capillary: 116 mg/dL — ABNORMAL HIGH (ref 70–99)
Glucose-Capillary: 242 mg/dL — ABNORMAL HIGH (ref 70–99)

## 2018-10-24 MED ORDER — GABAPENTIN 300 MG PO CAPS
300.0000 mg | ORAL_CAPSULE | Freq: Three times a day (TID) | ORAL | Status: DC
  Administered 2018-10-24 – 2018-10-28 (×13): 300 mg via ORAL
  Filled 2018-10-24 (×13): qty 1

## 2018-10-24 NOTE — Progress Notes (Signed)
Blake Vaughn [id number] [birth year]  CARE TEAM:  PCP: [name], [name], DO  Outpatient Care Team: Patient Care Team: [name], [name], DO as PCP - General  Inpatient Treatment Team: Treatment Team: Attending Provider: Nolon Nations, MD; Consulting Physician: Edison Pace, Md, MD; Registered Nurse: Wayna Chalet, RN; Technician: Gasper Sells, [place]; Registered Nurse: Dagoberto Ligas, RN; Registered Nurse: Franklyn Lor, RN; Technician: Warden Fillers, [place]; Registered Nurse: Mayra Neer D, RN   Problem List:   Principal Problem:   Mass of cecum s/p right colectomy 10/21/2018 Active Problems:   Diabetes type 2, uncontrolled ([place])   Pure hypercholesterolemia   HTN (hypertension)   Uses Spanish as primary spoken language   Obesity (BMI 30-39.9)   3 Days Post-Op  10/21/2018  PRE-OPERATIVE DIAGNOSIS:  Acute appendicitis  POST-OPERATIVE DIAGNOSIS:  Cecal mass; 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 Exploratory laparotomy with right hemicolectomy  SURGEON:  Nadeen Landau, MD  Assessment  OK with ileus  [facility] Stay = 3 days)  Plan:   FEN: Switch to the LR only with the hyperglycemia.   Dys1 / full liquids as tolerated.  Hold off on solid diet unless has more significant flatus/bowel movement and distention is less, implying that the ileus has resolved.  Hopefully tomorrow ID: Flagyl/Rocephin periop DM - Improved glc on resistant SSI.  Restarted glipizide HTN control DVT: Heparin, SCD Mobilize as tolerated to help recovery  F/u pathology Follow-up: Dr. [name]      20 minutes spent in review, evaluation, examination, counseling, and coordination of care.  More than 50% of that time was spent in counseling.  10/24/2018    Subjective: (Chief complaint)  Walking more in hallways.  Soreness at incision.  Some flatus but no bowel movement.  Tolerating some clear liquids.  No nausea or  vomiting.  Objective:  Vital signs:  Vitals:   10/23/18 1042 10/23/18 1500 10/23/18 2057 10/24/18 0547  BP: (!) 155/90 (!) 152/86 (!) 157/83 (!) 153/88  Pulse: 89 81 86 84  Resp: '18 18 16   ' Temp: 98.2 F (36.8 C) 98.3 F (36.8 C) 97.9 F (36.6 C) 98.7 F (37.1 C)  TempSrc: Oral Oral Oral Oral  SpO2: 96% 100% 98% 100%  Weight:      Height:        Last BM Date: 10/21/18  Intake/Output   Yesterday:  11/16 0701 - 11/17 0700 In: 170 [P.O.:120; IV Piggyback:50] Out: 400 [Urine:400] This shift:  No intake/output data recorded.  Bowel function:  Flatus: Maybe?  BM:  No  Drain: (No drain)   Physical Exam:  General: Pt awake/alert/oriented x4 in no acute distress Eyes: PERRL, normal EOM.  Sclera clear.  No icterus Neuro: CN II-XII intact w/o focal sensory/motor deficits. Lymph: No head/neck/groin lymphadenopathy Psych:  No delerium/psychosis/paranoia HENT: Normocephalic, Mucus membranes moist.  No thrush Neck: Supple, No tracheal deviation Chest: No chest wall pain w good excursion CV:  Pulses intact.  Regular rhythm MS: Normal AROM mjr joints.  No obvious deformity  Abdomen: Somewhat firm.  Moderately distended.  Mildly tender at incisions only.  Dressing removed.  Staples intact.  Minimal erythema supraumbilically but no evidence of cellulitis or abscess.  No evidence of peritonitis.  No incarcerated hernias.  Ext:  No deformity.  No mjr edema.  No cyanosis Skin: No petechiae / purpura  Results:   Labs: Results for orders placed or performed during the hospital encounter of 10/21/18 (from the past 48 hour(s))  Glucose,  capillary     Status: Abnormal   Collection Time: 10/22/18 12:03 PM  Result Value Ref Range   Glucose-Capillary 275 (H) 70 - 99 mg/dL  Glucose, capillary     Status: Abnormal   Collection Time: 10/22/18  5:05 PM  Result Value Ref Range   Glucose-Capillary 240 (H) 70 - 99 mg/dL  Glucose, capillary     Status: Abnormal   Collection Time:  10/22/18  9:15 PM  Result Value Ref Range   Glucose-Capillary 164 (H) 70 - 99 mg/dL  CBC     Status: Abnormal   Collection Time: 10/23/18  5:44 AM  Result Value Ref Range   WBC 14.4 (H) 4.0 - 10.5 K/uL   RBC 4.24 4.22 - 5.81 MIL/uL   Hemoglobin 12.6 (L) 13.0 - 17.0 g/dL   HCT 37.9 (L) 39.0 - 52.0 %   MCV 89.4 80.0 - 100.0 fL   MCH 29.7 26.0 - 34.0 pg   MCHC 33.2 30.0 - 36.0 g/dL   RDW 12.2 11.5 - 15.5 %   Platelets 236 150 - 400 K/uL   nRBC 0.0 0.0 - 0.2 %    Comment: Performed at [facility], [place]. [address] [place], [place] [address]  Basic metabolic panel     Status: Abnormal   Collection Time: 10/23/18  5:44 AM  Result Value Ref Range   Sodium 134 (L) 135 - 145 mmol/L   Potassium 4.1 3.5 - 5.1 mmol/L   Chloride 101 98 - 111 mmol/L   CO2 25 22 - 32 mmol/L   Glucose, Bld 224 (H) 70 - 99 mg/dL   BUN 8 6 - 20 mg/dL   Creatinine, Ser 0.89 0.61 - 1.24 mg/dL   Calcium 8.6 (L) 8.9 - 10.3 mg/dL   GFR calc non Af Amer >60 >60 mL/min   GFR calc Af Amer >60 >60 mL/min    Comment: (NOTE) The eGFR has been calculated using the CKD EPI equation. This calculation has not been validated in all clinical situations. eGFR's persistently <60 mL/min signify possible Chronic Kidney Disease.    Anion gap 8 5 - 15    Comment: Performed at [facility] [address], [place], [place] [address]  Glucose, capillary     Status: Abnormal   Collection Time: 10/23/18  8:14 AM  Result Value Ref Range   Glucose-Capillary 235 (H) 70 - 99 mg/dL  Glucose, capillary     Status: Abnormal   Collection Time: 10/23/18 12:03 PM  Result Value Ref Range   Glucose-Capillary 240 (H) 70 - 99 mg/dL  Glucose, capillary     Status: Abnormal   Collection Time: 10/23/18  4:58 PM  Result Value Ref Range   Glucose-Capillary 156 (H) 70 - 99 mg/dL  Glucose, capillary     Status: Abnormal   Collection Time: 10/23/18  9:01 PM  Result Value Ref Range   Glucose-Capillary 125 (H) 70 - 99 mg/dL  Glucose,  capillary     Status: Abnormal   Collection Time: 10/24/18  8:22 AM  Result Value Ref Range   Glucose-Capillary 133 (H) 70 - 99 mg/dL    Imaging / Studies: No results found.  Medications / Allergies: per chart  Antibiotics: Anti-infectives (From admission, onward)   Start     Dose/Rate Route Frequency Ordered Stop   10/21/18 1945  cefTRIAXone (ROCEPHIN) 2 g in sodium chloride 0.9 % 100 mL IVPB     2 g 200 mL/hr over 30 Minutes Intravenous  Once 10/21/18 1938 10/21/18 2030   10/21/18 1945  metroNIDAZOLE (FLAGYL) IVPB 500 mg     500 mg 100 mL/hr over 60 Minutes Intravenous  Once 10/21/18 1938 10/22/18 0243        Note: Portions of this report may have been transcribed using voice recognition software. Every effort was made to ensure accuracy; however, inadvertent computerized transcription errors may be present.   Any transcriptional errors that result from this process are unintentional.     Adin Hector, MD, FACS, MASCRS Gastrointestinal and Minimally Invasive Surgery    [address], [place], [place] [address] [phone] Main / Paging [phone] Fax

## 2018-10-25 LAB — GLUCOSE, CAPILLARY
GLUCOSE-CAPILLARY: 172 mg/dL — AB (ref 70–99)
GLUCOSE-CAPILLARY: 194 mg/dL — AB (ref 70–99)
Glucose-Capillary: 108 mg/dL — ABNORMAL HIGH (ref 70–99)
Glucose-Capillary: 167 mg/dL — ABNORMAL HIGH (ref 70–99)

## 2018-10-25 MED ORDER — METFORMIN HCL 500 MG PO TABS
1000.0000 mg | ORAL_TABLET | Freq: Two times a day (BID) | ORAL | Status: DC
  Administered 2018-10-25 – 2018-10-27 (×5): 1000 mg via ORAL
  Administered 2018-10-28: 500 mg via ORAL
  Filled 2018-10-25 (×6): qty 2

## 2018-10-25 MED ORDER — ASPIRIN EC 81 MG PO TBEC
81.0000 mg | DELAYED_RELEASE_TABLET | Freq: Every day | ORAL | Status: DC
  Administered 2018-10-26 – 2018-10-28 (×3): 81 mg via ORAL
  Filled 2018-10-25 (×3): qty 1

## 2018-10-25 MED ORDER — DOCUSATE SODIUM 100 MG PO CAPS
100.0000 mg | ORAL_CAPSULE | Freq: Two times a day (BID) | ORAL | Status: DC
  Administered 2018-10-25 – 2018-10-28 (×7): 100 mg via ORAL
  Filled 2018-10-25 (×7): qty 1

## 2018-10-25 MED ORDER — HYDROMORPHONE HCL 1 MG/ML IJ SOLN
0.5000 mg | INTRAMUSCULAR | Status: DC | PRN
  Administered 2018-10-25 – 2018-10-28 (×3): 2 mg via INTRAVENOUS
  Filled 2018-10-25 (×3): qty 2

## 2018-10-25 MED ORDER — INSULIN GLARGINE 100 UNIT/ML ~~LOC~~ SOLN: 10.0000 [IU] | Freq: Every day | SUBCUTANEOUS | Status: DC

## 2018-10-25 MED ORDER — INSULIN GLARGINE 100 UNIT/ML ~~LOC~~ SOLN
20.0000 [IU] | Freq: Every day | SUBCUTANEOUS | Status: DC
  Administered 2018-10-25: 20 [IU] via SUBCUTANEOUS
  Filled 2018-10-25 (×3): qty 0.2

## 2018-10-25 MED ORDER — ATORVASTATIN CALCIUM 10 MG PO TABS
10.0000 mg | ORAL_TABLET | Freq: Every day | ORAL | Status: DC
  Administered 2018-10-26 – 2018-10-27 (×2): 10 mg via ORAL
  Filled 2018-10-25 (×3): qty 1

## 2018-10-25 MED ORDER — OXYCODONE HCL 5 MG PO TABS
5.0000 mg | ORAL_TABLET | ORAL | Status: DC | PRN
  Administered 2018-10-25 – 2018-10-28 (×8): 10 mg via ORAL
  Filled 2018-10-25 (×8): qty 2

## 2018-10-25 MED ORDER — METHOCARBAMOL 500 MG PO TABS
500.0000 mg | ORAL_TABLET | Freq: Two times a day (BID) | ORAL | Status: DC
  Administered 2018-10-25 – 2018-10-28 (×6): 500 mg via ORAL
  Filled 2018-10-25 (×6): qty 1

## 2018-10-25 MED ORDER — POLYETHYLENE GLYCOL 3350 17 G PO PACK
17.0000 g | PACK | Freq: Every day | ORAL | Status: DC | PRN
  Administered 2018-10-26: 17 g via ORAL
  Filled 2018-10-25: qty 1

## 2018-10-25 NOTE — Progress Notes (Signed)
[facility] Progress Note  4 Days Post-Op  Subjective: CC:  C/o incisional pain, improved with pain meds. Feels the number of pills he is taking is contributing to distention and fullness. +flatus. Denies BM. Pulling 1750 cc on IS and reports using this every 2 hours. Walking in the hallways daily.  Objective: Vital signs in last 24 hours: Temp:  [98.1 F (36.7 C)-99.1 F (37.3 C)] 98.4 F (36.9 C) (11/18 0507) Pulse Rate:  [82-92] 92 (11/18 0507) Resp:  [16-17] 16 (11/18 0507) BP: (132-145)/(75-94) 145/94 (11/18 0507) SpO2:  [96 %-98 %] 97 % (11/18 0507) Last BM Date: 10/21/18  Intake/Output from previous day: 11/17 0701 - 11/18 0700 In: 714 [P.O.:714] Out: 300 [Urine:300] Intake/Output this shift: Total I/O In: -  Out: 350 [Urine:350]  PE: Gen:  Alert, NAD, pleasant Card:  Regular rate and rhythm, pedal pulses 2+ BL Pulm:  Normal effort, clear to auscultation bilaterally Abd: Soft, approp tender, incision clean and dry with staples, moderate distention, +BS  Skin: warm and dry, no rashes  Psych: A&Ox3   Lab Results:  Recent Labs    10/23/18 0544  WBC 14.4*  HGB 12.6*  HCT 37.9*  PLT 236   BMET Recent Labs    10/23/18 0544  NA 134*  K 4.1  CL 101  CO2 25  GLUCOSE 224*  BUN 8  CREATININE 0.89  CALCIUM 8.6*   PT/INR No results for input(s): LABPROT, INR in the last 72 hours. CMP     Component Value Date/Time   NA 134 (L) 10/23/2018 0544   NA 137 07/16/2017 1502   NA 133 (L) 09/09/2016 1338   K 4.1 10/23/2018 0544   K 5.1 09/09/2016 1338   CL 101 10/23/2018 0544   CL 102 01/26/2013 0805   CO2 25 10/23/2018 0544   CO2 20 (L) 09/09/2016 1338   GLUCOSE 224 (H) 10/23/2018 0544   GLUCOSE 293 (H) 09/09/2016 1338   GLUCOSE 268 (H) 01/26/2013 0805   BUN 8 10/23/2018 0544   BUN 14 07/16/2017 1502   BUN 18.9 09/09/2016 1338   CREATININE 0.89 10/23/2018 0544   CREATININE 1.0 09/09/2016 1338   CALCIUM 8.6 (L) 10/23/2018 0544   CALCIUM  9.8 09/09/2016 1338   PROT 5.7 (L) 10/21/2018 1659   PROT 5.9 (L) 07/16/2017 1502   PROT 6.7 09/09/2016 1338   ALBUMIN 2.9 (L) 10/21/2018 1659   ALBUMIN 4.0 07/16/2017 1502   ALBUMIN 3.6 09/09/2016 1338   AST 12 (L) 10/21/2018 1659   AST 14 09/09/2016 1338   ALT 12 10/21/2018 1659   ALT 22 09/09/2016 1338   ALKPHOS 63 10/21/2018 1659   ALKPHOS 83 09/09/2016 1338   BILITOT 0.5 10/21/2018 1659   BILITOT 0.3 07/16/2017 1502   BILITOT 0.40 09/09/2016 1338   GFRNONAA >60 10/23/2018 0544   GFRNONAA >89 04/02/2016 1229   GFRAA >60 10/23/2018 0544   GFRAA >89 04/02/2016 1229   Lipase     Component Value Date/Time   LIPASE 33 10/21/2018 1659       Studies/Results: No results found.  Anti-infectives: Anti-infectives (From admission, onward)   Start     Dose/Rate Route Frequency Ordered Stop   10/21/18 1945  cefTRIAXone (ROCEPHIN) 2 g in sodium chloride 0.9 % 100 mL IVPB     2 g 200 mL/hr over 30 Minutes Intravenous  Once 10/21/18 1938 10/21/18 2030   10/21/18 1945  metroNIDAZOLE (FLAGYL) IVPB 500 mg     500 mg 100 mL/hr  over 60 Minutes Intravenous  Once 10/21/18 1938 10/22/18 0243     Assessment/Plan HTN - metoprolol, BID, PRN hydralazine  T2DM - glipizide, SSI, DM coord recs lantus 10 mg QD on 11/15 noted;Dr. Gross re-started glipizide yesterday, will follow BG and for persistent hyperglycemia I will add basal insulin. Hypercholesterolemia - lipitor, ASA 81 mg  Cecal mass S/P Diagnostic laparoscopy, Exploratory laparotomy with right hemicolectomy 10/21/18 Dr. Nadeen Landau -  POD#4 -  Afebrile, VSS -  surgical pathology is still pending   FEN - continue DYS1/full liq ID - rocephin/flagyl 11/14 VTE - SCD's, SQ heparin Foley - removed POD#1   LOS: 4 days    Blake Vaughn, [facility] Pager: [phone]  Agree with above.  Awaiting pathology report.   Passing flatus.  Imogene Burn. Georgette Dover, MD, [facility]  General/  Trauma Surgery Beeper [phone]  10/25/2018 1:16 PM

## 2018-10-25 NOTE — Progress Notes (Signed)
Inpatient Diabetes Program Recommendations  AACE/ADA: New Consensus Statement on Inpatient Glycemic Control (2015)  Target Ranges:  Prepandial:   less than 140 mg/dL      Peak postprandial:   less than 180 mg/dL (1-2 hours)      Critically ill patients:  140 - 180 mg/dL   Lab Results  Component Value Date   GLUCAP 172 (H) 10/25/2018   HGBA1C 9.0 (H) 10/22/2018    Review of Glycemic Control Results for AIVAN, FILLINGIM (MRN [id number]) as of 10/25/2018 12:49  Ref. Range 10/24/2018 08:22 10/24/2018 12:48 10/24/2018 17:35 10/24/2018 20:53 10/25/2018 08:02  Glucose-Capillary Latest Ref Range: 70 - 99 mg/dL 133 (H) 257 (H) 116 (H) 242 (H) 172 (H)  Diabetes history: Type 2 DM Outpatient Diabetes medications: Metformin 1000 mg BID, Glipizide 10 mg BID Current orders for Inpatient glycemic control: Novolog 0-20 units tid, Novolog 0-5 units QHS Inpatient Diabetes Program Recommendations:   May consider adding Lantus 10 units daily while patient in the hospital. DM coordinator spoke with patient on 10/22/18.   Thanks,  Adah Perl, RN, BC-ADM Inpatient Diabetes Coordinator Pager [phone] (8a-5p)

## 2018-10-26 LAB — GLUCOSE, CAPILLARY
Glucose-Capillary: 223 mg/dL — ABNORMAL HIGH (ref 70–99)
Glucose-Capillary: 202 mg/dL — ABNORMAL HIGH (ref 70–99)
Glucose-Capillary: 161 mg/dL — ABNORMAL HIGH (ref 70–99)
Glucose-Capillary: 94 mg/dL (ref 70–99)

## 2018-10-26 NOTE — Plan of Care (Signed)

## 2018-10-26 NOTE — Progress Notes (Addendum)
[facility] Progress Note  5 Days Post-Op  Subjective: CC:  Peristent abdominal discomfort and bloating. +flatus. No BM. Mobilizing and using IS. We discussed the preliminary results of his pathology.  Objective: Vital signs in last 24 hours: Temp:  [98.2 F (36.8 C)-99.3 F (37.4 C)] 98.4 F (36.9 C) (11/19 0800) Pulse Rate:  [80-91] 80 (11/19 0800) Resp:  [13-18] 13 (11/19 0800) BP: (147-160)/(69-103) 160/90 (11/19 0800) SpO2:  [96 %-98 %] 97 % (11/19 0800) Last BM Date: 10/21/18  Intake/Output from previous day: 11/18 0701 - 11/19 0700 In: 600 [P.O.:600] Out: 350 [Urine:350] Intake/Output this shift: No intake/output data recorded.  PE: Gen:  Alert, NAD, pleasant Card:  Regular rate and rhythm, pedal pulses 2+ BL Pulm:  Normal effort, clear to auscultation bilaterally Abd: Soft, approp tender, incision clean and dry with staples, moderate distention, +BS  Skin: warm and dry, no rashes  Psych: A&Ox3   CMP     Component Value Date/Time   NA 134 (L) 10/23/2018 0544   NA 137 07/16/2017 1502   NA 133 (L) 09/09/2016 1338   K 4.1 10/23/2018 0544   K 5.1 09/09/2016 1338   CL 101 10/23/2018 0544   CL 102 01/26/2013 0805   CO2 25 10/23/2018 0544   CO2 20 (L) 09/09/2016 1338   GLUCOSE 224 (H) 10/23/2018 0544   GLUCOSE 293 (H) 09/09/2016 1338   GLUCOSE 268 (H) 01/26/2013 0805   BUN 8 10/23/2018 0544   BUN 14 07/16/2017 1502   BUN 18.9 09/09/2016 1338   CREATININE 0.89 10/23/2018 0544   CREATININE 1.0 09/09/2016 1338   CALCIUM 8.6 (L) 10/23/2018 0544   CALCIUM 9.8 09/09/2016 1338   PROT 5.7 (L) 10/21/2018 1659   PROT 5.9 (L) 07/16/2017 1502   PROT 6.7 09/09/2016 1338   ALBUMIN 2.9 (L) 10/21/2018 1659   ALBUMIN 4.0 07/16/2017 1502   ALBUMIN 3.6 09/09/2016 1338   AST 12 (L) 10/21/2018 1659   AST 14 09/09/2016 1338   ALT 12 10/21/2018 1659   ALT 22 09/09/2016 1338   ALKPHOS 63 10/21/2018 1659   ALKPHOS 83 09/09/2016 1338   BILITOT 0.5 10/21/2018  1659   BILITOT 0.3 07/16/2017 1502   BILITOT 0.40 09/09/2016 1338   GFRNONAA >60 10/23/2018 0544   GFRNONAA >89 04/02/2016 1229   GFRAA >60 10/23/2018 0544   GFRAA >89 04/02/2016 1229   Lipase     Component Value Date/Time   LIPASE 33 10/21/2018 1659       Studies/Results: No results found.  Anti-infectives: Anti-infectives (From admission, onward)   Start     Dose/Rate Route Frequency Ordered Stop   10/21/18 1945  cefTRIAXone (ROCEPHIN) 2 g in sodium chloride 0.9 % 100 mL IVPB     2 g 200 mL/hr over 30 Minutes Intravenous  Once 10/21/18 1938 10/21/18 2030   10/21/18 1945  metroNIDAZOLE (FLAGYL) IVPB 500 mg     500 mg 100 mL/hr over 60 Minutes Intravenous  Once 10/21/18 1938 10/22/18 0243     Assessment/Plan HTN - metoprolol, BID, PRN hydralazine  T2DM -  SSI, 25 mg lantus Hypercholesterolemia - lipitor, ASA 81 mg  Cecal mass S/P Diagnostic laparoscopy, Exploratory laparotomy with right hemicolectomy 10/21/18 Dr. Nadeen Landau -  POD#5 -  Afebrile, VSS - having flatus  - tolerating full liquids -  surgical pathology is still pending, spoke with pathologist Dr. 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 Final report should be  in the computer by tomorrow 11/20.  - will need oncology consult when final path is back. Will order CEA.   FEN - continue DYS1/full liq  ID - rocephin/flagyl 11/14 VTE - SCD's, SQ heparin Foley - removed POD#1   LOS: 5 days   Blake Vaughn, [facility] Pager: [phone]

## 2018-10-26 NOTE — Progress Notes (Signed)
   10/26/18 1600  Clinical Encounter Type  Visited With Patient and family together;Other (Comment) (video interpretor)  Visit Type Initial   Made introductory visit w/ pt, pt's brother was present.  Attempted to explain via interpretor role of chaplain.  Not certain it was successful.  Pt desired to know when his surgeon would visit tomorrow.  I will communicate this question to pt's RN.  Myra Gianotti resident, [phone]

## 2018-10-27 LAB — GLUCOSE, CAPILLARY
GLUCOSE-CAPILLARY: 172 mg/dL — AB (ref 70–99)
GLUCOSE-CAPILLARY: 179 mg/dL — AB (ref 70–99)
GLUCOSE-CAPILLARY: 91 mg/dL (ref 70–99)
Glucose-Capillary: 116 mg/dL — ABNORMAL HIGH (ref 70–99)

## 2018-10-27 LAB — CEA: CEA: 4 ng/mL (ref 0.0–4.7)

## 2018-10-27 MED ORDER — POLYETHYLENE GLYCOL 3350 17 G PO PACK
17.0000 g | PACK | Freq: Every day | ORAL | Status: DC
  Administered 2018-10-28: 17 g via ORAL
  Filled 2018-10-27: qty 1

## 2018-10-27 NOTE — Progress Notes (Addendum)
[facility] Progress Note  6 Days Post-Op  Subjective: Patient, lying in bed and understandably emotional about his condition. He reports doing well overall physically. He is tolerating PO w/o NV. He reports 1 BM this morning which was normal for him and he continues to have flatus. He reports minimal pain/discomfort while lying in bed. He says he feels more pain in his abdomen while walking the hallways, but otherwise ambulates without issue.  Objective: Vital signs in last 24 hours: Temp:  [98.2 F (36.8 C)-98.7 F (37.1 C)] 98.7 F (37.1 C) (11/20 0547) Pulse Rate:  [88-101] 88 (11/20 0547) Resp:  [16-22] 16 (11/20 0547) BP: (135-159)/(78-87) 159/87 (11/20 0547) SpO2:  [94 %-97 %] 95 % (11/20 0547) Last BM Date: 10/21/18  Intake/Output from previous day: 11/19 0701 - 11/20 0700 In: 540 [P.O.:540] Out: -  Intake/Output this shift: Total I/O In: 350 [P.O.:350] Out: 300 [Urine:300]  PE: Gen:  Alert, NAD, pleasant but understandably emotional/tearful Card:  Regular rate and rhythm Pulm:  Normal effort, clear to auscultation bilaterally Abd: Soft, mildly TTP, moderately distended, bowel sounds present in all 4 quadrants, midline incisions C/D/I with minimal redness noted along the staple line. Skin: warm and dry, no rashes  Psych: A&Ox3   Lab Results:  No results for input(s): WBC, HGB, HCT, PLT in the last 72 hours. BMET No results for input(s): NA, K, CL, CO2, GLUCOSE, BUN, CREATININE, CALCIUM in the last 72 hours. PT/INR No results for input(s): LABPROT, INR in the last 72 hours. CMP     Component Value Date/Time   NA 134 (L) 10/23/2018 0544   NA 137 07/16/2017 1502   NA 133 (L) 09/09/2016 1338   K 4.1 10/23/2018 0544   K 5.1 09/09/2016 1338   CL 101 10/23/2018 0544   CL 102 01/26/2013 0805   CO2 25 10/23/2018 0544   CO2 20 (L) 09/09/2016 1338   GLUCOSE 224 (H) 10/23/2018 0544   GLUCOSE 293 (H) 09/09/2016 1338   GLUCOSE 268 (H) 01/26/2013 0805   BUN 8 10/23/2018 0544   BUN 14 07/16/2017 1502   BUN 18.9 09/09/2016 1338   CREATININE 0.89 10/23/2018 0544   CREATININE 1.0 09/09/2016 1338   CALCIUM 8.6 (L) 10/23/2018 0544   CALCIUM 9.8 09/09/2016 1338   PROT 5.7 (L) 10/21/2018 1659   PROT 5.9 (L) 07/16/2017 1502   PROT 6.7 09/09/2016 1338   ALBUMIN 2.9 (L) 10/21/2018 1659   ALBUMIN 4.0 07/16/2017 1502   ALBUMIN 3.6 09/09/2016 1338   AST 12 (L) 10/21/2018 1659   AST 14 09/09/2016 1338   ALT 12 10/21/2018 1659   ALT 22 09/09/2016 1338   ALKPHOS 63 10/21/2018 1659   ALKPHOS 83 09/09/2016 1338   BILITOT 0.5 10/21/2018 1659   BILITOT 0.3 07/16/2017 1502   BILITOT 0.40 09/09/2016 1338   GFRNONAA >60 10/23/2018 0544   GFRNONAA >89 04/02/2016 1229   GFRAA >60 10/23/2018 0544   GFRAA >89 04/02/2016 1229   Lipase     Component Value Date/Time   LIPASE 33 10/21/2018 1659       Studies/Results: No results found.  Anti-infectives: Anti-infectives (From admission, onward)   Start     Dose/Rate Route Frequency Ordered Stop   10/21/18 1945  cefTRIAXone (ROCEPHIN) 2 g in sodium chloride 0.9 % 100 mL IVPB     2 g 200 mL/hr over 30 Minutes Intravenous  Once 10/21/18 1938 10/21/18 2030   10/21/18 1945  metroNIDAZOLE (FLAGYL) IVPB 500 mg  500 mg 100 mL/hr over 60 Minutes Intravenous  Once 10/21/18 1938 10/22/18 0243       Assessment/Plan 1. S/p Ex Lap with RT hemicolectomy for cecal mass on 10/21/18- Dr. [name] - Afebrile, VSS - Surgical pathology still pending at this time.  - Tolerating liquid diet. 1 BM this morning. Will advance to soft diet prior to discharge. - Continue Tylenol and PRN Oxycodone for pain management - Continue ambulating as tolerated - If patient continues tolerating PO on advanced diet, he will likely be okay to discharge home this afternoon. We have arranged for him to f/u with oncology as an outpatient pending path results. Will have translator at bedside per pt request to discuss this plan.   2.  HTN - Continue current anti-HTN regimen of metoprolol and PRN hydralazine  3. DM2 - Continue current regimen of Metformin, glipizide, SSI and Lantus  4. Hypercholesterolemia: - Continue Lipitor and ASA 81mg   FEN: Tolerating PO liquids, advance to soft diet as tolerated ID: None VTE: SubQ Heparin and SCD's    LOS: 6 days   Vita Barley, PA-S

## 2018-10-27 NOTE — Discharge Instructions (Signed)
[facility]      [facility] [phone]  OPEN ABDOMINAL SURGERY: POST OP INSTRUCTIONS  Always review your discharge instruction sheet given to you by the facility where your surgery was performed.  IF YOU HAVE DISABILITY OR FAMILY LEAVE FORMS, YOU MUST BRING THEM TO THE OFFICE FOR PROCESSING.  PLEASE DO NOT GIVE THEM TO YOUR DOCTOR.  1. A prescription for pain medication may be given to you upon discharge.  Take your pain medication as prescribed, if needed.  If narcotic pain medicine is not needed, then you may take acetaminophen (Tylenol) or ibuprofen (Advil) as needed. 2. Take your usually prescribed medications unless otherwise directed. 3. If you need a refill on your pain medication, please contact your pharmacy. They will contact our office to request authorization.  Prescriptions will not be filled after 5pm or on week-ends. 4. You should follow a light diet the first few days after arrival home, such as soup and crackers, pudding, etc.unless your doctor has advised otherwise. A high-fiber, low fat diet can be resumed as tolerated.   Be sure to include lots of fluids daily. Most patients will experience some swelling and bruising on the chest and neck area.  Ice packs will help.  Swelling and bruising can take several days to resolve 5. Most patients will experience some swelling and bruising in the area of the incision. Ice pack will help. Swelling and bruising can take several days to resolve..  6. It is common to experience some constipation if taking pain medication after surgery.  Increasing fluid intake and taking a stool softener will usually help or prevent this problem from occurring.  A mild laxative (Milk of Magnesia or Miralax) should be taken according to package directions if there are no bowel movements after 48 hours. 7.  You may have steri-strips (small skin tapes) in place directly over the incision.  These strips should be left on the skin for 7-10 days.  If your  surgeon used skin glue on the incision, you may shower in 24 hours.  The glue will flake off over the next 2-3 weeks.  Any sutures or staples will be removed at the office during your follow-up visit. You may find that a light gauze bandage over your incision may keep your staples from being rubbed or pulled. You may shower and replace the bandage daily. 8. ACTIVITIES:  You may resume regular (light) daily activities beginning the next day--such as daily self-care, walking, climbing stairs--gradually increasing activities as tolerated.  You may have sexual intercourse when it is comfortable.  Refrain from any heavy lifting or straining until approved by your doctor. a. You may drive when you no longer are taking prescription pain medication, you can comfortably wear a seatbelt, and you can safely maneuver your car and apply brakes b. Return to Work: ___________________________________ 9. You should see your doctor in the office for a follow-up appointment approximately two weeks after your surgery.  Make sure that you call for this appointment within a day or two after you arrive home to insure a convenient appointment time. OTHER INSTRUCTIONS:  _____________________________________________________________ _____________________________________________________________  WHEN TO CALL YOUR DOCTOR: 1. Fever over 101.0 2. Inability to urinate 3. Nausea and/or vomiting 4. Extreme swelling or bruising 5. Continued bleeding from incision. 6. Increased pain, redness, or drainage from the incision. 7. Difficulty swallowing or breathing 8. Muscle cramping or spasms. 9. Numbness or tingling in hands or feet or around lips.  The clinic staff is available to   answer your questions during regular business hours.  Please don't hesitate to call and ask to speak to one of the nurses if you have concerns.  For further questions, please visit [email or web address]   

## 2018-10-28 ENCOUNTER — Telehealth: Payer: Self-pay | Admitting: Hematology

## 2018-10-28 ENCOUNTER — Encounter: Payer: Self-pay | Admitting: Hematology

## 2018-10-28 LAB — GLUCOSE, CAPILLARY
GLUCOSE-CAPILLARY: 182 mg/dL — AB (ref 70–99)
Glucose-Capillary: 247 mg/dL — ABNORMAL HIGH (ref 70–99)

## 2018-10-28 MED ORDER — SACCHAROMYCES BOULARDII 250 MG PO CAPS: 250.0000 mg | ORAL_CAPSULE | Freq: Two times a day (BID) | ORAL | Status: DC

## 2018-10-28 MED ORDER — GUAIFENESIN-DM 100-10 MG/5ML PO SYRP: 5.0000 mL | ORAL_SOLUTION | ORAL | Status: DC | PRN

## 2018-10-28 MED ORDER — DOCUSATE SODIUM 100 MG PO CAPS: 100.0000 mg | ORAL_CAPSULE | Freq: Two times a day (BID) | ORAL | 0 refills | Status: DC

## 2018-10-28 MED ORDER — ENSURE SURGERY PO LIQD: 237.0000 mL | Freq: Two times a day (BID) | ORAL | Status: DC

## 2018-10-28 MED ORDER — OXYCODONE HCL 5 MG PO TABS: 5.0000 mg | ORAL_TABLET | ORAL | 0 refills | Status: DC | PRN

## 2018-10-28 MED ORDER — ACETAMINOPHEN 500 MG PO TABS: 1000.0000 mg | ORAL_TABLET | Freq: Four times a day (QID) | ORAL | 0 refills | Status: DC | PRN

## 2018-10-28 MED ORDER — POLYETHYLENE GLYCOL 3350 17 G PO PACK: 17.0000 g | PACK | Freq: Every day | ORAL | 0 refills | Status: DC

## 2018-10-28 MED ORDER — METOPROLOL TARTRATE 25 MG PO TABS: 12.5000 mg | ORAL_TABLET | Freq: Two times a day (BID) | ORAL | 0 refills | Status: DC

## 2018-10-28 MED FILL — oxyCODONE HCL 5 MG TABS: 5 | 3 days supply | Qty: 20 | Fill #0

## 2018-10-28 MED FILL — METOPROLOL TARTRATE 25 MG T: 25 | 30 days supply | Qty: 60 | Fill #0

## 2018-10-28 NOTE — Care Management Note (Signed)
Case Management Note  Patient Details  Name: Blake Vaughn MRN: [id number] Date of Birth: [birth year]  Subjective/Objective:                    Action/Plan:  Patient entered in [facility]. Transitions of Care Pharmacy will bring medications to room. Need prescriptions to enter any needed over rides. Expected Discharge Date:                  Expected Discharge Plan:  Home/Self Care  In-House Referral:  Financial Counselor  Discharge planning Services  CM Consult, Medication Assistance, [place], [facility]  Post Acute Care Choice:  NA Choice offered to:     DME Arranged:    DME Agency:  NA  HH Arranged:  NA HH Agency:  NA  Status of Service:  In process, will continue to follow  If discussed at Long Length of Stay Meetings, dates discussed:    Additional Comments:  Marilu Favre, RN 10/28/2018, 11:03 AM

## 2018-10-28 NOTE — Progress Notes (Signed)
Pt for discharge going home , discontinued peripheral IV line, wound site open to air with staples no bleeding noted, ambulates in the hallway, had bowel movement and tolerates his meal, given health teachings, next appointment, due med explained and understood, prescriptions given, also given all his personal belongings, no s/s of distress noted.

## 2018-10-28 NOTE — Discharge Summary (Signed)
[facility] Discharge Summary   Patient ID: Blake Vaughn MRN: [id number] DOB/AGE: 1961/07/07 57 y.o.  Admit date: 10/21/2018 Discharge date: 10/28/2018  Admitting Diagnosis: Pre-opative diagnosis: Acute appendicitis Post-operative diagnosis: Cecal mass; probable peritoneal implants on ileocolic pedicle  Discharge Diagnosis Patient Active Problem List   Diagnosis Date Noted  . Uses Spanish as primary spoken language 10/23/2018  . Obesity (BMI 30-39.9) 10/23/2018  . Mass of cecum s/p right colectomy 10/21/2018 10/22/2018  . Appendicitis 10/21/2018  . Nocturnal leg cramps 07/16/2017  . HTN (hypertension) 11/27/2011  . FOOT PAIN, BILATERAL 11/05/2009  . Pure hypercholesterolemia 09/24/2009  . 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 TOBACCO USE, QUIT 08/18/2009  . Diabetes type 2, uncontrolled ([place]) 07/09/2009  . 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  Procedures Dr. [name] (10/21/18) - Diagnostic laparoscopy, Exploratory laparotomy with right hemicolectomy  Hospital Course:  Blake Vaughn is a 57yo male PMH HTN, HLD, DM who presented to [facility]14 with 1 day of periumbilical abdominal pain.  CT scan showed acute non-perforated appendicitis. Patient was admitted and taken to the operating room. Intra-operatively patient was found to have a cecal mass with probable peritoneal implants on ileoceolic pedicle.  Tolerated procedure well and was transferred to the floor.  Patient did have an ileus postoperatively, but this improved with time. Diet was advanced as tolerated.  On POD7 the patient was voiding well, tolerating diet, ambulating well, pain well controlled, vital signs stable, incisions c/d/i and felt stable for discharge home. Official surgical pathology pending at time of discharge. He has been referred to oncology for outpatient appointment. Patient will follow up as below  and knows to call with questions or concerns.     Physical Exam: Gen: Alert, NAD, pleasant Card: RRR Pulm: Normal effort, clear to auscultation bilaterally Abd: Soft, distended, appropriately tender, midline incision clean and dry with staples and no erythema or drainage, +BS Skin: warm and dry, no rashes  Psych: A&Ox3   Allergies as of 10/28/2018      Reactions   Iohexol Other (See Comments)    Code: HIVES, Desc: pt had itching 8 hrs after 6/10 scan;and now was today 08/16/09 was given 50mg  benadryl 1 hr before scan and he did fine.Amy Rogal, Onset Date: [id number] PLEASE HAVE PT TAKE 50MG  OF BENADRYL 1HR PRIOR TO SCAN      Medication List    STOP taking these medications   diphenhydrAMINE 25 MG tablet Commonly known as:  BENADRYL   methocarbamol 500 MG tablet Commonly known as:  ROBAXIN   naproxen 500 MG tablet Commonly known as:  NAPROSYN   traMADol 50 MG tablet Commonly known as:  ULTRAM     TAKE these medications   acetaminophen 500 MG tablet Commonly known as:  TYLENOL Take 2 tablets (1,000 mg total) by mouth every 6 (six) hours as needed.   Alcohol Swabs Pads Use as instructed   aspirin EC 81 MG tablet Take 1 tablet (81 mg total) by mouth daily.   atorvastatin 10 MG tablet Commonly known as:  LIPITOR Take 1 tablet (10 mg total) by mouth daily.   docusate sodium 100 MG capsule Commonly known as:  COLACE Take 1 capsule (100 mg total) by mouth 2 (two) times daily.   feeding supplement Liqd Take 237 mLs by mouth 2 (two) times daily between meals.   glipiZIDE 10 MG tablet Commonly known as:  GLUCOTROL TAKE 1 TABLET BY  MOUTH TWICE DAILY BEFORE MEAL(S)   glucose blood test strip Use as instructed   metFORMIN 1000 MG tablet Commonly known as:  GLUCOPHAGE Take 1 tablet (1,000 mg total) by mouth 2 (two) times daily with a meal.   metoprolol tartrate 25 MG tablet Commonly known as:  LOPRESSOR Take 0.5 tablets (12.5 mg total) by mouth 2 (two) times  daily.   oxyCODONE 5 MG immediate release tablet Commonly known as:  Oxy IR/ROXICODONE Take 1 tablet (5 mg total) by mouth every 4 (four) hours as needed for severe pain.   polyethylene glycol packet Commonly known as:  MIRALAX / GLYCOLAX Take 17 g by mouth daily.   RELION LANCETS STANDARD 21G Misc Use as instructed   RELION PRIME MONITOR Devi Use device as instructed   saccharomyces boulardii 250 MG capsule Commonly known as:  FLORASTOR Take 1 capsule (250 mg total) by mouth 2 (two) times daily.        Follow-up [facility], [place]. Go on 11/03/2018.   Specialty:  General Surgery Why:  Your appointment is 11/03/18 at 10am with one of our nurses to have your staples removed. Please arrive 30 minutes prior to your appointment to check in and fill out paperwork. Bring photo ID and insurance information. Contact information: [address] [place] [phone]       [name], MD. Go on 11/16/2018.   Specialty:  General Surgery Why:  Your appointment is 11/16/18 at 9am Please arrive 15 minutes early to check in. Contact information: [place] [address] [phone]        [name], [name], DO. Call.   Specialty:  Family Medicine Why:  Call to arrange post-hospitalization follow up appointment Contact information: [address] [place] [address] [place] Follow up.   Why:  Phone number: [phone] Someone from their office should contact you with an appointment          Signed: [name], [facility] 10/28/2018, 12:10 PM Pager: [phone] Mon 7:00 am -11:30 AM Tues-Fri 7:00 am-4:30 pm Sat-Sun 7:00 am-11:30 am

## 2018-10-28 NOTE — Telephone Encounter (Signed)
New referral from Dr. [name] for a dx of colon cancer. Pt has been scheduled to see Dr. Burr Medico on 12/4 at 230pm. Letter mailed.

## 2018-11-04 ENCOUNTER — Other Ambulatory Visit: Payer: Self-pay

## 2018-11-04 ENCOUNTER — Inpatient Hospital Stay (HOSPITAL_COMMUNITY)
Admission: EM | Admit: 2018-11-04 | Discharge: 2018-11-08 | DRG: 920 | Disposition: A | Discharge status: Home / Self Care | Payer: Medicaid Other | Attending: Family Medicine | Admitting: Family Medicine

## 2018-11-04 ENCOUNTER — Emergency Department (HOSPITAL_COMMUNITY): Payer: Medicaid Other

## 2018-11-04 ENCOUNTER — Encounter (HOSPITAL_COMMUNITY): Payer: Self-pay | Admitting: *Deleted

## 2018-11-04 DIAGNOSIS — Z9221 Personal history of antineoplastic chemotherapy: Secondary | ICD-10-CM

## 2018-11-04 DIAGNOSIS — F419 Anxiety disorder, unspecified: Secondary | ICD-10-CM | POA: Diagnosis present

## 2018-11-04 DIAGNOSIS — I10 Essential (primary) hypertension: Secondary | ICD-10-CM | POA: Diagnosis present

## 2018-11-04 DIAGNOSIS — E669 Obesity, unspecified: Secondary | ICD-10-CM | POA: Diagnosis present

## 2018-11-04 DIAGNOSIS — K91840 Postprocedural hemorrhage and hematoma of a digestive system organ or structure following a digestive system procedure: Principal | ICD-10-CM | POA: Diagnosis present

## 2018-11-04 DIAGNOSIS — Z7984 Long term (current) use of oral hypoglycemic drugs: Secondary | ICD-10-CM

## 2018-11-04 DIAGNOSIS — K6389 Other specified diseases of intestine: Secondary | ICD-10-CM | POA: Diagnosis present

## 2018-11-04 DIAGNOSIS — Z9049 Acquired absence of other specified parts of digestive tract: Secondary | ICD-10-CM

## 2018-11-04 DIAGNOSIS — R944 Abnormal results of kidney function studies: Secondary | ICD-10-CM | POA: Diagnosis present

## 2018-11-04 DIAGNOSIS — Z91041 Radiographic dye allergy status: Secondary | ICD-10-CM

## 2018-11-04 DIAGNOSIS — E778 Other disorders of glycoprotein metabolism: Secondary | ICD-10-CM | POA: Diagnosis present

## 2018-11-04 DIAGNOSIS — E1136 Type 2 diabetes mellitus with diabetic cataract: Secondary | ICD-10-CM | POA: Diagnosis present

## 2018-11-04 DIAGNOSIS — Z8572 Personal history of non-Hodgkin lymphomas: Secondary | ICD-10-CM

## 2018-11-04 DIAGNOSIS — Z87891 Personal history of nicotine dependence: Secondary | ICD-10-CM

## 2018-11-04 DIAGNOSIS — C8290 Follicular lymphoma, unspecified, unspecified site: Secondary | ICD-10-CM | POA: Diagnosis present

## 2018-11-04 DIAGNOSIS — Z789 Other specified health status: Secondary | ICD-10-CM | POA: Diagnosis present

## 2018-11-04 DIAGNOSIS — E8809 Other disorders of plasma-protein metabolism, not elsewhere classified: Secondary | ICD-10-CM | POA: Diagnosis present

## 2018-11-04 DIAGNOSIS — E7800 Pure hypercholesterolemia, unspecified: Secondary | ICD-10-CM | POA: Diagnosis present

## 2018-11-04 DIAGNOSIS — N529 Male erectile dysfunction, unspecified: Secondary | ICD-10-CM | POA: Diagnosis present

## 2018-11-04 DIAGNOSIS — Z7982 Long term (current) use of aspirin: Secondary | ICD-10-CM

## 2018-11-04 DIAGNOSIS — M1990 Unspecified osteoarthritis, unspecified site: Secondary | ICD-10-CM | POA: Diagnosis present

## 2018-11-04 DIAGNOSIS — K921 Melena: Secondary | ICD-10-CM | POA: Diagnosis present

## 2018-11-04 DIAGNOSIS — K922 Gastrointestinal hemorrhage, unspecified: Secondary | ICD-10-CM

## 2018-11-04 DIAGNOSIS — I951 Orthostatic hypotension: Secondary | ICD-10-CM | POA: Diagnosis not present

## 2018-11-04 DIAGNOSIS — Z79899 Other long term (current) drug therapy: Secondary | ICD-10-CM

## 2018-11-04 DIAGNOSIS — E875 Hyperkalemia: Secondary | ICD-10-CM | POA: Diagnosis present

## 2018-11-04 DIAGNOSIS — IMO0002 Reserved for concepts with insufficient information to code with codable children: Secondary | ICD-10-CM | POA: Diagnosis present

## 2018-11-04 DIAGNOSIS — E861 Hypovolemia: Secondary | ICD-10-CM | POA: Diagnosis present

## 2018-11-04 DIAGNOSIS — C180 Malignant neoplasm of cecum: Secondary | ICD-10-CM | POA: Diagnosis present

## 2018-11-04 DIAGNOSIS — E871 Hypo-osmolality and hyponatremia: Secondary | ICD-10-CM | POA: Diagnosis present

## 2018-11-04 DIAGNOSIS — Z9181 History of falling: Secondary | ICD-10-CM

## 2018-11-04 DIAGNOSIS — E1165 Type 2 diabetes mellitus with hyperglycemia: Secondary | ICD-10-CM | POA: Diagnosis present

## 2018-11-04 DIAGNOSIS — Y836 Removal of other organ (partial) (total) as the cause of abnormal reaction of the patient, or of later complication, without mention of misadventure at the time of the procedure: Secondary | ICD-10-CM | POA: Diagnosis present

## 2018-11-04 DIAGNOSIS — D62 Acute posthemorrhagic anemia: Secondary | ICD-10-CM | POA: Diagnosis present

## 2018-11-04 DIAGNOSIS — R799 Abnormal finding of blood chemistry, unspecified: Secondary | ICD-10-CM | POA: Diagnosis present

## 2018-11-04 DIAGNOSIS — E785 Hyperlipidemia, unspecified: Secondary | ICD-10-CM | POA: Diagnosis present

## 2018-11-04 HISTORY — DX: Unspecified appendicitis: K37

## 2018-11-04 HISTORY — DX: Follicular lymphoma, unspecified, unspecified site: C82.90

## 2018-11-04 HISTORY — DX: Personal history of nicotine dependence: Z87.891

## 2018-11-04 LAB — CBC WITH DIFFERENTIAL/PLATELET
ABS IMMATURE GRANULOCYTES: 0.04 10*3/uL (ref 0.00–0.07)
BASOS ABS: 0 10*3/uL (ref 0.0–0.1)
Basophils Relative: 0 %
EOS PCT: 4 %
Eosinophils Absolute: 0.3 10*3/uL (ref 0.0–0.5)
HCT: 29.3 % — ABNORMAL LOW (ref 39.0–52.0)
HEMOGLOBIN: 9.6 g/dL — AB (ref 13.0–17.0)
IMMATURE GRANULOCYTES: 1 %
LYMPHS ABS: 2.3 10*3/uL (ref 0.7–4.0)
LYMPHS PCT: 26 %
MCH: 28.9 pg (ref 26.0–34.0)
MCHC: 32.8 g/dL (ref 30.0–36.0)
MCV: 88.3 fL (ref 80.0–100.0)
Monocytes Absolute: 0.8 10*3/uL (ref 0.1–1.0)
Monocytes Relative: 9 %
NEUTROS ABS: 5.4 10*3/uL (ref 1.7–7.7)
NEUTROS PCT: 60 %
NRBC: 0 % (ref 0.0–0.2)
Platelets: 415 10*3/uL — ABNORMAL HIGH (ref 150–400)
RBC: 3.32 MIL/uL — ABNORMAL LOW (ref 4.22–5.81)
RDW: 11.7 % (ref 11.5–15.5)
WBC: 8.8 10*3/uL (ref 4.0–10.5)

## 2018-11-04 LAB — BASIC METABOLIC PANEL
ANION GAP: 9 (ref 5–15)
BUN: 34 mg/dL — ABNORMAL HIGH (ref 6–20)
CHLORIDE: 102 mmol/L (ref 98–111)
CO2: 24 mmol/L (ref 22–32)
Calcium: 8.6 mg/dL — ABNORMAL LOW (ref 8.9–10.3)
Creatinine, Ser: 1.02 mg/dL (ref 0.61–1.24)
GFR calc non Af Amer: >60 mL/min (ref >60)
Glucose, Bld: 255 mg/dL — ABNORMAL HIGH (ref 70–99)
POTASSIUM: 5.2 mmol/L — AB (ref 3.5–5.1)
SODIUM: 135 mmol/L (ref 135–145)

## 2018-11-04 LAB — CBG MONITORING, ED: Glucose-Capillary: 201 mg/dL — ABNORMAL HIGH (ref 70–99)

## 2018-11-04 IMAGING — CT CT ABD-PELV W/O CM
2 of 4 series · 17 of 46 positions shown, 19 images · non-contrast
Comparison: CT [DATE], [DATE]

CLINICAL DATA: Abdomen pain, rectal bleeding

EXAM:
CT ABDOMEN AND PELVIS WITHOUT CONTRAST
TECHNIQUE: Multidetector CT imaging of the abdomen and pelvis was performed
following the standard protocol without IV contrast.

[Series 3: a/p w/o 5mm · axial · non-contrast · 0.77mm/px · z∈[-585,-170]mm · 14 of 91 slices shown, 16 images]
[im 4/91  soft-tissue]
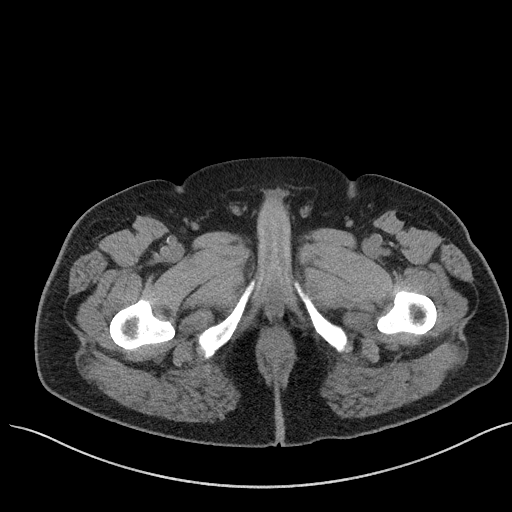
[im 4/91  bone]
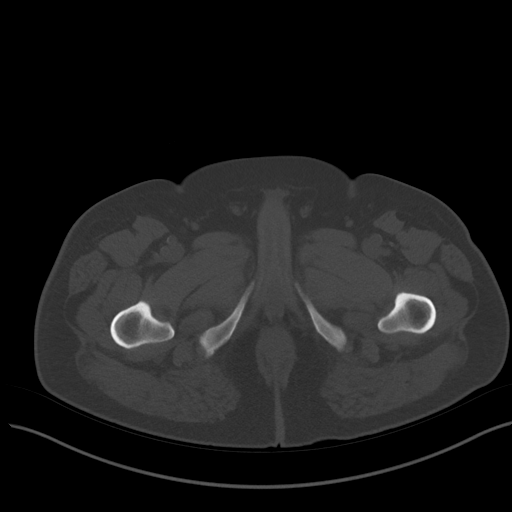
[im 11/91  soft-tissue]
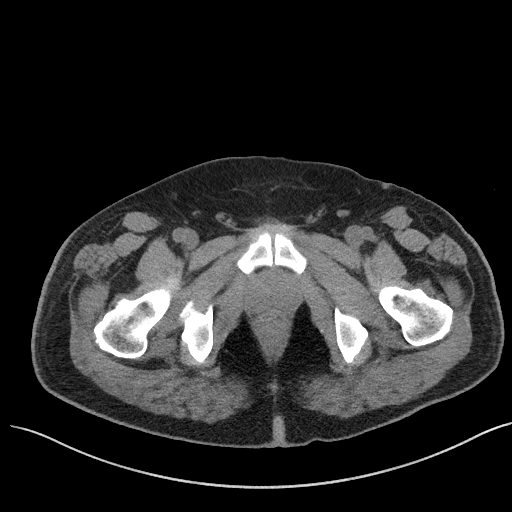
[im 19/91  soft-tissue]
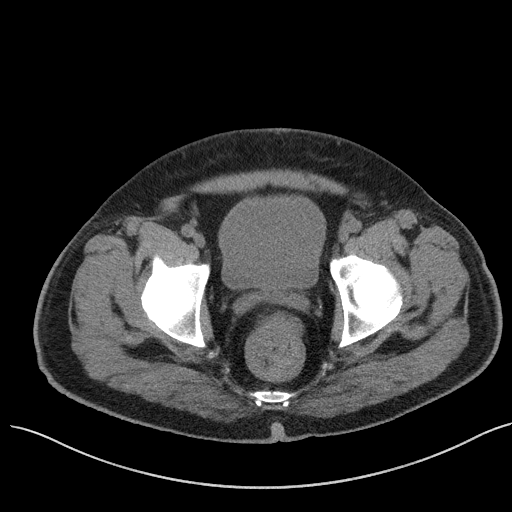
[im 26/91  soft-tissue]
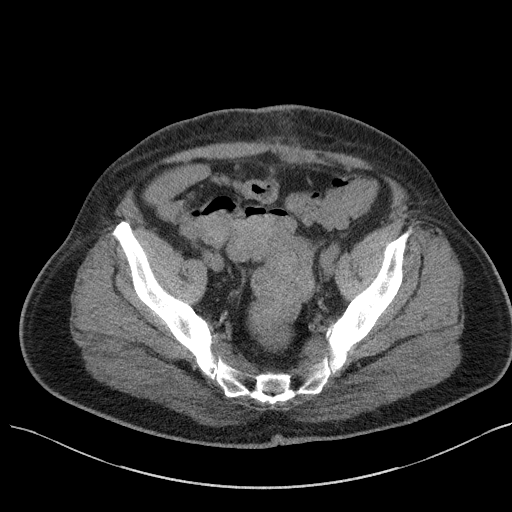
[im 29/91  soft-tissue]
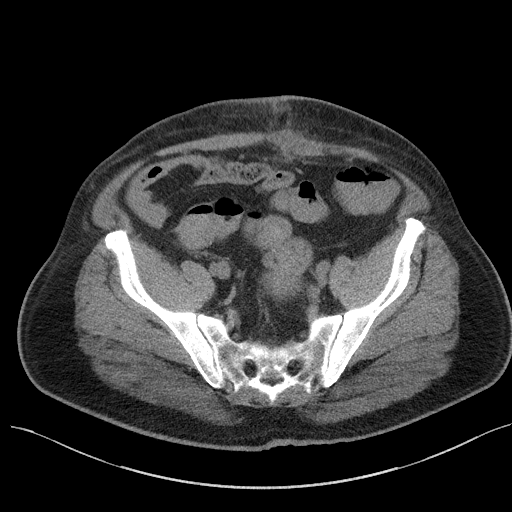
[im 37/91  soft-tissue]
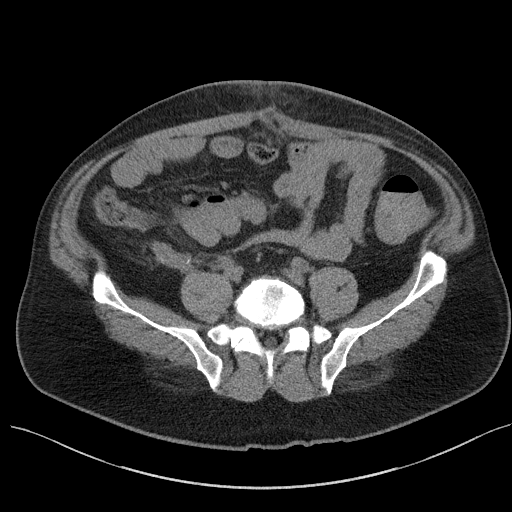
[im 44/91  soft-tissue]
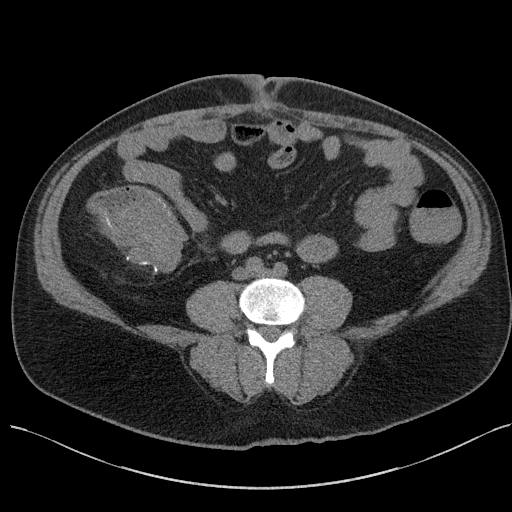
[im 47/91  soft-tissue]
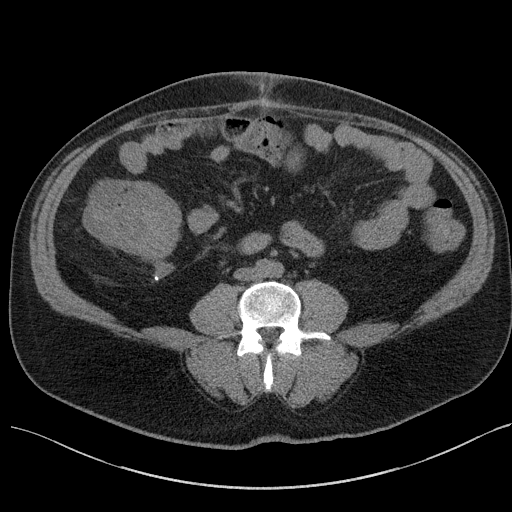
[im 55/91  soft-tissue]
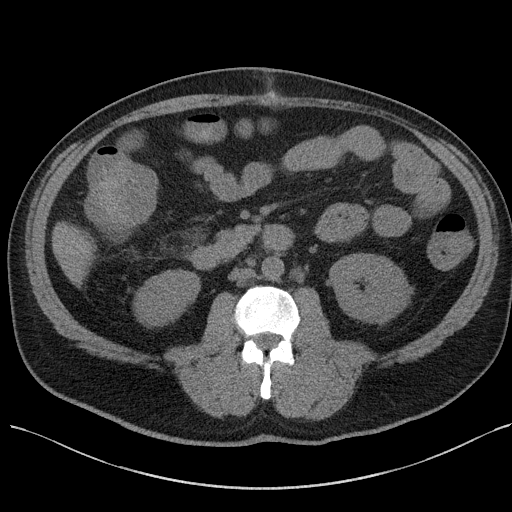
[im 55/91  bone]
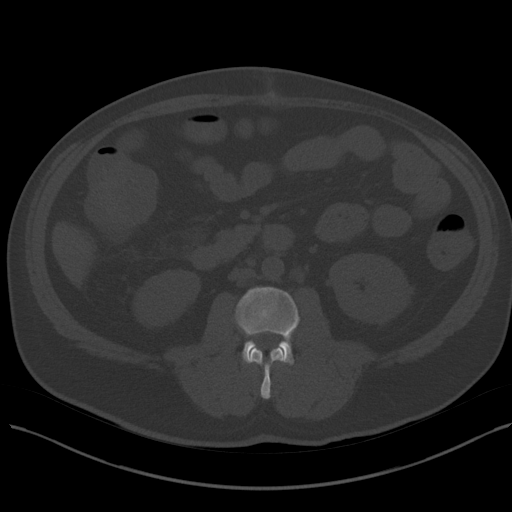
[im 62/91  soft-tissue]
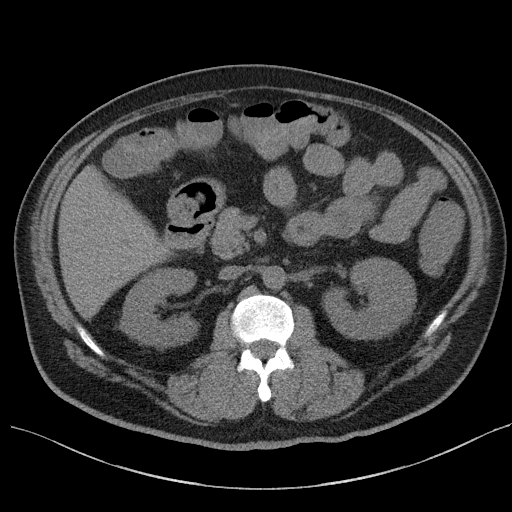
[im 69/91  soft-tissue]
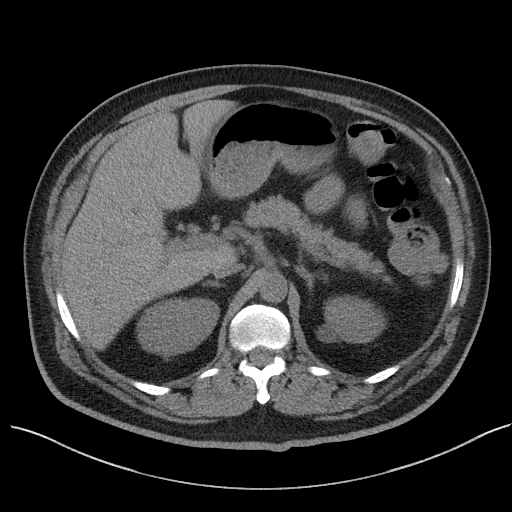
[im 73/91  soft-tissue]
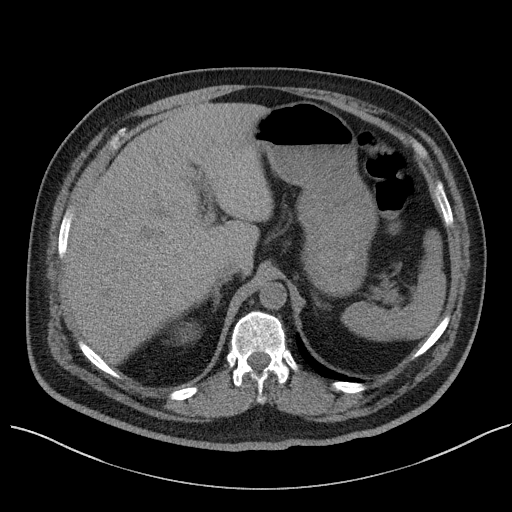
[im 80/91  soft-tissue]
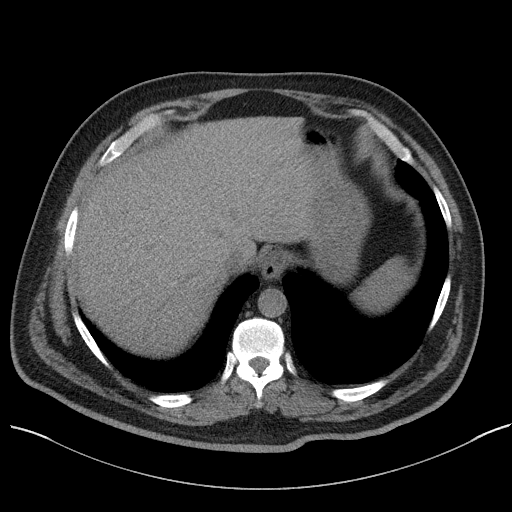
[im 87/91  soft-tissue]
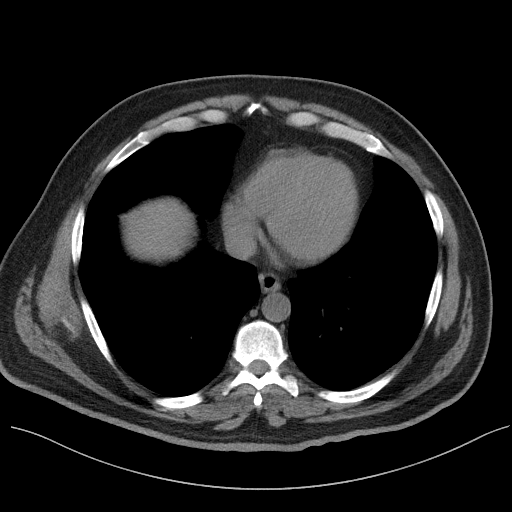

[Series 6: a/p w/o cor · coronal · non-contrast · 0.86mm/px · 3 of 170 slices shown]
[im 57/170  soft-tissue]
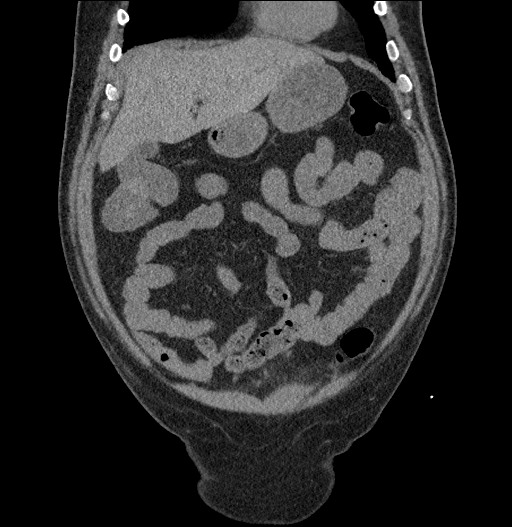
[im 76/170  soft-tissue]
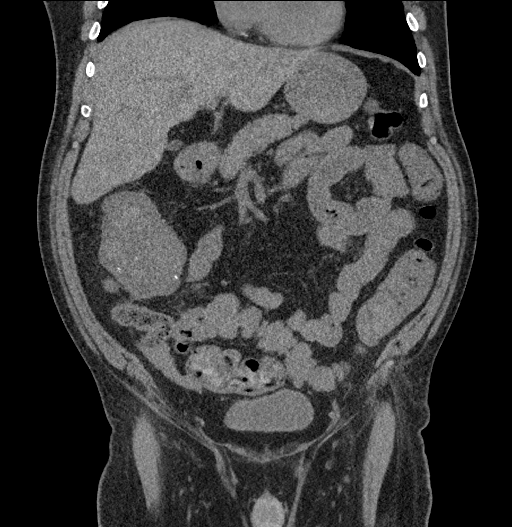
[im 94/170  soft-tissue]
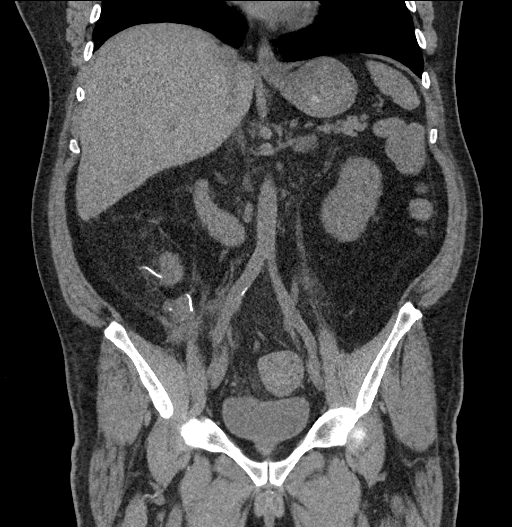

[17 of 46 positions shown; findings below may reference images not displayed]

FINDINGS: Lower chest: Lung bases demonstrate no acute consolidation or
effusion. Heart size is normal. Calcified granuloma in the right
middle lobe.

Hepatobiliary: No focal liver abnormality is seen. No gallstones,
gallbladder wall thickening, or biliary dilatation.

Pancreas: Unremarkable. No pancreatic ductal dilatation or
surrounding inflammatory changes.

Spleen: Normal in size without focal abnormality.

Adrenals/Urinary Tract: Adrenal glands are unremarkable. Kidneys are
normal, without renal calculi, focal lesion, or hydronephrosis.
Bladder is unremarkable.

Stomach/Bowel: Stomach is nonenlarged. No dilated small bowel.
Diffuse fluid within the colon without wall thickening. Interval
appendectomy. Residual soft tissue thickening and inflammatory
changes in the right lower quadrant adjacent to the cecum.

Vascular/Lymphatic: Mild aortic atherosclerosis. No aneurysm. Small
retroperitoneal nodes

Reproductive: Prostate is unremarkable.

Other: No free air

Musculoskeletal: No acute or significant osseous findings.
IMPRESSION: 1. Interval appendectomy. There is residual soft tissue thickening
and stranding in the right lower quadrant adjacent to the cecum
which may represent ongoing inflammation versus postsurgical
changes. A small soft tissue density adjacent to the surgical
sutures may reflect small hematoma or operative collection. No large
focal fluid collection to suggest drainable abscess allowing for
absence of contrast
2. Fluid-filled colon without wall thickening, could reflect
diarrheal process

## 2018-11-04 MED ORDER — ONDANSETRON HCL 4 MG PO TABS: 4.0000 mg | ORAL_TABLET | Freq: Four times a day (QID) | ORAL | Status: DC | PRN

## 2018-11-04 MED ORDER — ONDANSETRON HCL 4 MG/2ML IJ SOLN: 4.0000 mg | Freq: Four times a day (QID) | INTRAMUSCULAR | Status: DC | PRN

## 2018-11-04 MED ORDER — SODIUM CHLORIDE 0.9 % IV BOLUS
1000.0000 mL | Freq: Once | INTRAVENOUS | Status: AC
  Administered 2018-11-04: 1000 mL via INTRAVENOUS

## 2018-11-04 MED ORDER — SODIUM CHLORIDE 0.9 % IV SOLN
INTRAVENOUS | Status: DC
  Administered 2018-11-04 – 2018-11-06 (×4): via INTRAVENOUS

## 2018-11-04 MED ORDER — INSULIN ASPART 100 UNIT/ML ~~LOC~~ SOLN
0.0000 [IU] | Freq: Three times a day (TID) | SUBCUTANEOUS | Status: DC
  Administered 2018-11-05: 2 [IU] via SUBCUTANEOUS

## 2018-11-04 MED ORDER — PANTOPRAZOLE SODIUM 40 MG IV SOLR
40.0000 mg | Freq: Two times a day (BID) | INTRAVENOUS | Status: DC
  Administered 2018-11-04 – 2018-11-08 (×8): 40 mg via INTRAVENOUS
  Filled 2018-11-04 (×8): qty 40

## 2018-11-04 MED ORDER — PANTOPRAZOLE SODIUM 40 MG IV SOLR
40.0000 mg | Freq: Once | INTRAVENOUS | Status: AC
  Administered 2018-11-04: 40 mg via INTRAVENOUS
  Filled 2018-11-04: qty 40

## 2018-11-04 MED ORDER — ACETAMINOPHEN 650 MG RE SUPP: 650.0000 mg | Freq: Four times a day (QID) | RECTAL | Status: DC | PRN

## 2018-11-04 MED ORDER — ACETAMINOPHEN 325 MG PO TABS
650.0000 mg | ORAL_TABLET | Freq: Four times a day (QID) | ORAL | Status: DC | PRN
  Administered 2018-11-05 – 2018-11-08 (×6): 650 mg via ORAL
  Filled 2018-11-04 (×6): qty 2

## 2018-11-04 NOTE — H&P (Addendum)
[facility] Admission History and Physical Service Pager: [phone]  Patient name: Blake Vaughn Medical record number: [id number] Date of birth: March 06, 1961 Age: 57 y.o. Gender: male  Primary Care Provider: [name], [name], DO Consultants: GI Code Status: Full code  Chief Complaint: Bloody stool   Assessment and Plan: Blake Vaughn is a 57 y.o. 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  #BRBPR s/p R Hemicolectomy Patient reports BRBPR since 1400 on 11/29 and has five episodes since. He reports both dark and red liquid in toilet today. Pt denies dizziness, tachycardia, previous loc (prior to single episode in ED). Hgb is 9.6 on arrival.  On 11/16, hemoglobin is 12.6.  Prior to surgery, it appears that hemoglobin is around 15 at baseline.  Patient does not report being on any blood thinners. Case discussed with Surgery service by ED provider and they do not feel like there is a surgical role. Also discussed with Dr. Johnney Killian from GI, who recommended PPI, NPO, fluids, and likely scope 11/30. . Admit to FMTS - Level of Care: Tele - Attending: McDiarmid  . Continuous Cardiac monitoring & pulse ox . Vitals per unit protocol . Diet Clears, NPO at midnight  . mIVF 121ml/hr while NPO . Out of bed with assistance, fall risk . GI consult  o PPI, scope 11/30.  If percent persistently tachycardic, please contact physician  Fluid bolus versus emergency GI intervention  AM CBC, CMP, INR, PTT, EKG, reticulocyte count  #Syncopal episode Patient noted to have witnessed syncopal event in the ED while he was getting up to use the bedside commode.  Admitting team walked into room to find patient collapsed on the bed with legs propped over side of bed, diaphoretic, regaining consciousness.  CBG at time of  event was 201 and bedside EKG was within normal limits. Patient was noted to be hypotensive and tachycardic.  Patient continued to improve symptomatically during interview and reported feeling better after starting fluid resuscitation with normal saline bolus.  Likely due to orthostatic hypotension is patient is anemic and likely hypovolemic.  Patient's blood pressure was within normal limits by conclusion of admission interview. Status post 1 L normal saline x2 in ED  Continue maintenance IV fluids overnight, 100 mL/h  Continuous cardiac monitoring  Out of bed with assistance, fall risk  #Diabetes type 2 Patient takes glipizide and metformin at home.  Initial glucose is 255.  Hold home oral medications, will hold while inpatient  sSSI TID AC  #Hypertension At home, patient on 12.5 mg metoprolol twice daily, ASA 81, atorvastatin 10 mg  Hold ASA 81.  Hold metoprolol in setting of low blood pressures and recent syncopal episode.  Hold atorvastatin while patient n.p.o.  #Recent appendectomy complicated by cecal mass, s/p right colectomy Patient was hospitalized for recent appendectomy on 10/21/2018. A pedicular mass was discovered during laparoscopy. Per surgical notes, patient required right hemicolectomy as bowel showed signs of ischemia s/p removal of mass.  Specimen sent to surgical pathology and has not returned.  Patient was referred to oncology on discharge, but has yet to follow-up.  It would benefit patient to have oncology follow-up scheduled prior to discharge to avoid patient being lost to follow-up.  Consult case management  Follow up heme/onc consult placed on discharge  #History of follicular lymphoma, in remission Patient reports history of lymphoma and chemotherapy from 2006-2012 (  or so, per patient).  He followed with Dr. Julien Nordmann. Looks like referral was entered and approved at last admission, unclear why patient has not heard from oncology yet (per his report). Will make  sure follow up with oncology in place prior to discharge.  Fluids: Status post 2 L fluid bolus in ED, Ns @ 163mL/hr  (Access 2 large-bore IVs) Electrolytes: Replete PRN Nutrition: Clears, n.p.o. at midnight GI ppx: IV PPI DVT ppx: Not indicated at this time in setting of active GI bleed  Future labs: AM, CBC, CMP, reticulocyte count Disposition: Telemetry  History of the Present Illness  Blake Vaughn is a 57 y.o. male who presents with bloody stools since 2 PM today x5 episodes.  Patient denies any bright red blood per rectum prior to this episode.  Patient was recently admitted for acute nonperforated appendicitis and found to have a cecal mass during lap appendectomy resulting in diagnostic ex lap and right hemicolectomy on October 21, 2018. Since his surgery, he has been having regular bowel movements and no other signs of GI bleeding.  Patient denies any recent trauma.  In the ED, patient's BMP was significant for potassium 5.2, glucose 255, BUN 34.  CBC with hemoglobin 9.6 (was within normal limits postop), hematocrit 29.3, platelets 415.  Patient is status post 1 L normal saline.  CT abdomen pelvis without contrast shows right lower quadrant soft tissue thickening and stranding possibly representing ongoing inflammation versus postop changes.  Possible small hematoma or operative collection.  No suspicion for fluid collection suggesting abscess.  Colon is fluid-filled without wall thickening, possibly reflecting diarrheal process.  GI was consulted and recommended patient to be admitted for medical management as he will require colonoscopy. Upon arrival for patient interview, patient was found to be collapsed diagonally on hospital bed with legs over side of bed, diaphoretic, waking from episode of loss of consciousness.  Point-of-care glucose was 201, EKG was within normal limits, patient was tachycardic and hypotensive to the low 84Z systolic.  Patient was started on 1 L bolus and  reported feeling improved at the end of admission interview.  Patient admitted to telemetry.  Review Of Systems: Per HPI, including the following: Review of Systems  Constitutional: Negative for chills, fever and malaise/fatigue.  Respiratory: Negative for cough and hemoptysis.   Cardiovascular: Negative for chest pain.  Gastrointestinal: Positive for blood in stool. Negative for abdominal pain, nausea and vomiting.  Musculoskeletal: Negative for back pain.  Neurological: Negative for dizziness, loss of consciousness, weakness and headaches.  All other systems reviewed and are negative.   Past Medical History: Past Medical History:  Diagnosis Date  . Diabetes mellitus   . Hypercholesterolemia   . Hypertension 11/27/2011  . Lymphoma [facility])    gets annual chemo last tx Feb 2013  . Lymphoma ([place])   . Sore throat and laryngitis 09/10/2016   Patient Active Problem List   Diagnosis Date Noted  . Acute lower GI bleeding 11/04/2018    Priority: High  . GI bleed 11/04/2018  . Uses Spanish as primary spoken language 10/23/2018  . Obesity (BMI 30-39.9) 10/23/2018  . Mass of cecum s/p right colectomy 10/21/2018 10/22/2018  . Appendicitis 10/21/2018  . Nocturnal leg cramps 07/16/2017  . HTN (hypertension) 11/27/2011  . FOOT PAIN, BILATERAL 11/05/2009  . Pure hypercholesterolemia 09/24/2009  . 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 TOBACCO USE, QUIT 08/18/2009  . Diabetes type 2, uncontrolled ([place]) 07/09/2009  . 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 Follicular lymphoma ([place])  12/27/2008    Past Surgical History: Past Surgical History:  Procedure Laterality Date  . LAPAROSCOPIC APPENDECTOMY N/A 10/21/2018   Procedure: Exploratory laparotomy right hemicolectomy;  Surgeon: [name], MD;  [facility];  Service: General;  Laterality: N/A;    Social History: Social History   Tobacco Use  . Smoking status: Former Smoker    Last attempt to quit: 12/08/2006    Years since  quitting: 11.9  . Smokeless tobacco: Former Systems developer    Quit date: 11/20/2006  Substance Use Topics  . Alcohol use: No  . Drug use: No   Family History: History reviewed. No pertinent family history.  Allergies and Medications: Allergies  Allergen Reactions  . Iohexol Other (See Comments)     Code: HIVES, Desc: pt had itching 8 hrs after 6/10 scan;and now was today 08/16/09 was given 50mg  benadryl 1 hr before scan and he did fine.Amy Rogal, Onset Date: [id number] PLEASE HAVE PT TAKE 50MG  OF BENADRYL 1HR PRIOR TO SCAN*    No current facility-administered medications on file prior to encounter.    Current Outpatient Medications on File Prior to Encounter  Medication Sig Dispense Refill  . acetaminophen (TYLENOL) 500 MG tablet Take 2 tablets (1,000 mg total) by mouth every 6 (six) hours as needed. 30 tablet 0  . aspirin EC 81 MG tablet Take 1 tablet (81 mg total) by mouth daily. 90 tablet 4  . atorvastatin (LIPITOR) 10 MG tablet Take 1 tablet (10 mg total) by mouth daily. 90 tablet 4  . glipiZIDE (GLUCOTROL) 10 MG tablet TAKE 1 TABLET BY MOUTH TWICE DAILY BEFORE MEAL(S) (Patient taking differently: Take 10 mg by mouth 2 (two) times daily. ) 180 tablet 0  . metFORMIN (GLUCOPHAGE) 1000 MG tablet Take 1 tablet (1,000 mg total) by mouth 2 (two) times daily with a meal. 90 tablet 4  . metoprolol tartrate (LOPRESSOR) 25 MG tablet Take 0.5 tablets (12.5 mg total) by mouth 2 (two) times daily. 60 tablet 0  . Alcohol Swabs PADS Use as instructed 100 each 6  . Blood Glucose Monitoring Suppl (RELION PRIME MONITOR) DEVI Use device as instructed 1 Device 0  . docusate sodium (COLACE) 100 MG capsule Take 1 capsule (100 mg total) by mouth 2 (two) times daily. (Patient not taking: Reported on 11/04/2018) 10 capsule 0  . feeding supplement (ENSURE SURGERY) LIQD Take 237 mLs by mouth 2 (two) times daily between meals. (Patient not taking: Reported on 11/04/2018)    . glucose blood test strip Use as instructed 100  each 12  . oxyCODONE (OXY IR/ROXICODONE) 5 MG immediate release tablet Take 1 tablet (5 mg total) by mouth every 4 (four) hours as needed for severe pain. (Patient not taking: Reported on 11/04/2018) 20 tablet 0  . polyethylene glycol (MIRALAX / GLYCOLAX) packet Take 17 g by mouth daily. (Patient not taking: Reported on 11/04/2018) 14 each 0  . RELION LANCETS STANDARD 21G MISC Use as instructed 200 each 6  . saccharomyces boulardii (FLORASTOR) 250 MG capsule Take 1 capsule (250 mg total) by mouth 2 (two) times daily. (Patient not taking: Reported on 11/04/2018)      Objective: Physical Exam BP 111/74   Pulse 100   Temp 98.1 F (36.7 C)   Resp 16   Ht 5\' 2"  (1.575 m)   Wt 90.7 kg   SpO2 97%   BMI 36.57 kg/m   Gen: Overweight, Latino male, Initially, patient appeared pale, tremulous and diaphoretic.  Towards end of the exam,  patient appeared improved, alert, nontoxic, well-appearing.  HEENT: Normocephaic, atraumatic. PERRLA, clear conjuctiva, no scleral icterus and injection. Normal EOM.  Nares patent with no discharge. Oropharynx without erythema and lesions.  Tonsils nonswollen and without exudate.   CV: Regular rate and rhythm.  Normal S1-S2. RP 2+ bilaterally. No BLEE. Resp: CTAB.  No w/r/r/abnormal breath sounds.  No increased WOB appreciated. Abd: NTND on palpation to all 4 quadrants. No masses palpated. Positive bowel sounds.  Abdominal surgical site is clean and intact, appears to be healing well.  There is 1 cm length of incision that is draining a minor amount of serosanguineous fluid.  There are no signs of obvious infection or purulent drainage.  Stitches have been removed, Steri-Strips intact.    Back: No CVAT. Spine symmetric with no TTP on spinous processes.  Skin: Abdominal skin exam as above, otherwise no obvious rashes, lesions, or trauma. MSK: Normal ROM. Normal muscle strength and tone.  Neuro:Cranial nerves II through VI grossly intact. Gait not appreciated. No  obvious abnormal movements. Psych: Cooperative with exam.  Normal speech. Pleasant. Makes good eye contact. Genitourinary: deferred.   Labs and Imaging: CBC BMET  Recent Labs  Lab 11/04/18 1903  WBC 8.8  HGB 9.6*  HCT 29.3*  PLT 415*   Recent Labs  Lab 11/04/18 1903  NA 135  K 5.2*  CL 102  CO2 24  BUN 34*  CREATININE 1.02  GLUCOSE 255*  CALCIUM 8.6*      Imaging/Diagnostic Tests: Ct Abdomen Pelvis Wo Contrast  Result Date: 11/04/2018 CLINICAL DATA:  Abdomen pain, rectal bleeding EXAM: CT ABDOMEN AND PELVIS WITHOUT CONTRAST TECHNIQUE: Multidetector CT imaging of the abdomen and pelvis was performed following the standard protocol without IV contrast. COMPARISON:  CT 10/21/2018, 09/09/2016 FINDINGS: Lower chest: Lung bases demonstrate no acute consolidation or effusion. Heart size is normal. Calcified granuloma in the right middle lobe. Hepatobiliary: No focal liver abnormality is seen. No gallstones, gallbladder wall thickening, or biliary dilatation. Pancreas: Unremarkable. No pancreatic ductal dilatation or surrounding inflammatory changes. Spleen: Normal in size without focal abnormality. Adrenals/Urinary Tract: Adrenal glands are unremarkable. Kidneys are normal, without renal calculi, focal lesion, or hydronephrosis. Bladder is unremarkable. Stomach/Bowel: Stomach is nonenlarged. No dilated small bowel. Diffuse fluid within the colon without wall thickening. Interval appendectomy. Residual soft tissue thickening and inflammatory changes in the right lower quadrant adjacent to the cecum. Vascular/Lymphatic: Mild aortic atherosclerosis. No aneurysm. Small retroperitoneal nodes Reproductive: Prostate is unremarkable. Other: No free air Musculoskeletal: No acute or significant osseous findings. IMPRESSION: 1. Interval appendectomy. There is residual soft tissue thickening and stranding in the right lower quadrant adjacent to the cecum which may represent ongoing inflammation versus  postsurgical changes. A small soft tissue density adjacent to the surgical sutures may reflect small hematoma or operative collection. No large focal fluid collection to suggest drainable abscess allowing for absence of contrast 2. Fluid-filled colon without wall thickening, could reflect diarrheal process Electronically Signed   By: Donavan Foil M.D.   On: 11/04/2018 20:40     [name], M.D. 11/04/2018, 9:59 PM PGY-1, [place] Intern pager: [phone], text pages welcome -------------------------------------------------------------------------------------------------------------------- Upper Level Addendum: I have seen and evaluated this patient along with Dr. Maudie Mercury and reviewed the above note, making necessary revisions in brown.  [name] MD PGY-2 Family Medicine Resident

## 2018-11-04 NOTE — ED Triage Notes (Signed)
Pt states rectal bleeding x 5 today.  Denies abdominal pain.  Recent abdominal surgery.  Surgical site intact.  N s/s infection.

## 2018-11-04 NOTE — ED Notes (Addendum)
Per phillinda, EMT she was assisting pt to bedside commode when pt had a syncopal episode in the bed. Pt did not hit head and had loss of consciousness for 5-10 seconds. Kris Mouton, MD at bedside and RN immediately after event. Pt alert and oriented currenlty. EKG shot per MD, CBG check and bolus hung. Per admitting, MD pt to be changed to tele level of care. 6N aware of change in care.

## 2018-11-04 NOTE — ED Provider Notes (Signed)
[facility] EMERGENCY DEPARTMENT Provider Note   CSN: [id number] Arrival date & time: 11/04/18  1845     History   Chief Complaint Chief Complaint  Patient presents with  . GI Bleeding    HPI Blake Vaughn is a 57 y.o. male.  Patient is a 57 year old male presenting with complaints of rectal bleeding.  He was recently diagnosed with acute appendicitis, but found to have a cecal mass.  His surgery went from laparoscopic to midline incision.  He seemed to recover well then was discharged 9 days ago.  He presents today with complaints of bloody bowel movements.  He has had 5 of these today.  He states that when he goes to the bathroom he is passing blood.  He denies significant discomfort, fever, or chills.  The history is provided by the patient.    Past Medical History:  Diagnosis Date  . Diabetes mellitus   . Hypercholesterolemia   . Hypertension 11/27/2011  . Lymphoma [facility])    gets annual chemo last tx Feb 2013  . Lymphoma ([place])   . Sore throat and laryngitis 09/10/2016    Patient Active Problem List   Diagnosis Date Noted  . Uses Spanish as primary spoken language 10/23/2018  . Obesity (BMI 30-39.9) 10/23/2018  . Mass of cecum s/p right colectomy 10/21/2018 10/22/2018  . Appendicitis 10/21/2018  . Nocturnal leg cramps 07/16/2017  . HTN (hypertension) 11/27/2011  . FOOT PAIN, BILATERAL 11/05/2009  . Pure hypercholesterolemia 09/24/2009  . 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 TOBACCO USE, QUIT 08/18/2009  . Diabetes type 2, uncontrolled ([place]) 07/09/2009  . 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 LAPAROSCOPIC APPENDECTOMY N/A 10/21/2018   Procedure: Exploratory laparotomy right hemicolectomy;  Surgeon: [name], MD;  Location: [place];  Service: General;  Laterality: N/A;        Home Medications    Prior to Admission medications     Medication Sig Start Date End Date Taking? Authorizing Provider  acetaminophen (TYLENOL) 500 MG tablet Take 2 tablets (1,000 mg total) by mouth every 6 (six) hours as needed. 10/28/18   Meuth, Blaine Hamper, [name]  Alcohol Swabs PADS Use as instructed 09/05/16   Schorr, [name], NP  aspirin EC 81 MG tablet Take 1 tablet (81 mg total) by mouth daily. Patient not taking: Reported on 10/22/2018 07/16/17   [name], [name], DO  atorvastatin (LIPITOR) 10 MG tablet Take 1 tablet (10 mg total) by mouth daily. Patient not taking: Reported on 10/22/2018 07/16/17   [name], [name], DO  Blood Glucose Monitoring Suppl (RELION PRIME MONITOR) DEVI Use device as instructed 04/14/16   [name], DO  docusate sodium (COLACE) 100 MG capsule Take 1 capsule (100 mg total) by mouth 2 (two) times daily. 10/28/18   Meuth, [name], [name]  feeding supplement (ENSURE SURGERY) LIQD Take 237 mLs by mouth 2 (two) times daily between meals. 10/28/18   Meuth, [name], [name]  glipiZIDE (GLUCOTROL) 10 MG tablet TAKE 1 TABLET BY MOUTH TWICE DAILY BEFORE MEAL(S) 06/07/18   [name], [name], DO  glucose blood test strip Use as instructed 04/14/16   [name], DO  metFORMIN (GLUCOPHAGE) 1000 MG tablet Take 1 tablet (1,000 mg total) by mouth 2 (two) times daily with a meal. 07/16/17   [name], [name], DO  metoprolol tartrate (LOPRESSOR) 25 MG tablet Take 0.5 tablets (12.5 mg total) by mouth 2 (  two) times daily. 10/28/18   Meuth, [name], [name]  oxyCODONE (OXY IR/ROXICODONE) 5 MG immediate release tablet Take 1 tablet (5 mg total) by mouth every 4 (four) hours as needed for severe pain. 10/28/18   Meuth, [name], [name]  polyethylene glycol (MIRALAX / GLYCOLAX) packet Take 17 g by mouth daily. 10/28/18   Meuth, Blaine Hamper, [name]  RELION LANCETS STANDARD 21G MISC Use as instructed 04/14/16   [name], DO  saccharomyces boulardii (FLORASTOR) 250 MG capsule Take 1 capsule (250 mg total) by mouth 2 (two) times daily. 10/28/18   Meuth, Blaine Hamper, [name]    Family History No family history on file.  Social History Social History   Tobacco Use  . Smoking status: Former Smoker    Last attempt to quit: 12/08/2006    Years since quitting: 11.9  . Smokeless tobacco: Former Systems developer    Quit date: 11/20/2006  Substance Use Topics  . Alcohol use: No  . Drug use: No     Allergies   Iohexol   Review of Systems Review of Systems  All other systems reviewed and are negative.    Physical Exam Updated Vital Signs BP (!) 128/95 (BP Location: Right Arm)   Pulse 99   Resp 18   Ht 5\' 2"  (1.575 m)   Wt 90.7 kg   SpO2 99%   BMI 36.57 kg/m   Physical Exam  Constitutional: He is oriented to person, place, and time. He appears well-developed and well-nourished. No distress.  HENT:  Head: Normocephalic and atraumatic.  Mouth/Throat: Oropharynx is clear and moist.  Neck: Normal range of motion. Neck supple.  Cardiovascular: Normal rate and regular rhythm. Exam reveals no friction rub.  No murmur heard. Pulmonary/Chest: Effort normal and breath sounds normal. No respiratory distress. He has no wheezes. He has no rales.  Abdominal: Soft. Bowel sounds are normal. He exhibits no distension. There is no tenderness.  There is a midline incision present.  There is slight separation of the wound edges at the inferior aspect, however no significant drainage or erythema.  Musculoskeletal: Normal range of motion. He exhibits no edema.  Neurological: He is alert and oriented to person, place, and time. Coordination normal.  Skin: Skin is warm and dry. He is not diaphoretic.  Nursing note and vitals reviewed.    ED Treatments / Results  Labs (all labs ordered are listed, but only abnormal results are displayed) Labs Reviewed  BASIC METABOLIC PANEL  CBC WITH DIFFERENTIAL/PLATELET  TYPE AND SCREEN    EKG None  Radiology No results found.  Procedures Procedures (including critical care time)  Medications Ordered in  ED Medications  sodium chloride 0.9 % bolus 1,000 mL (has no administration in time range)     Initial Impression / Assessment and Plan / ED Course  I have reviewed the triage vital signs and the nursing notes.  Pertinent labs & imaging results that were available during my care of the patient were reviewed by me and considered in my medical decision making (see chart for details).  Patient is a 57 year old male with recent appendicitis with cecal mass resenting with rectal bleeding.  His hemoglobin has dropped 3 g from his discharge.  He has some maroon-colored stool on rectal exam.  CT scan shows no definitive complications of the surgery.  This finding was discussed with Dr. Hulen Skains from general surgery who does not feel as though any surgical intervention is necessary at this time.  This was also  discussed with Dr. Cristina Gong from gastroenterology who is recommending Protonix, n.p.o. after midnight, and admission to the hospitalist service for serial hemoglobin and observation.  He may undergo endoscopy and/or colonoscopy depending on how the evening goes.  Final Clinical Impressions(s) / ED Diagnoses   Final diagnoses:  None    ED Discharge Orders    None       Veryl Speak, MD 11/04/18 2237

## 2018-11-05 ENCOUNTER — Encounter (HOSPITAL_COMMUNITY)
Admission: EM | Disposition: A | Discharge status: Home / Self Care | Payer: Self-pay | Source: Home / Self Care | Attending: Family Medicine

## 2018-11-05 ENCOUNTER — Observation Stay (HOSPITAL_COMMUNITY): Payer: Medicaid Other | Admitting: Registered Nurse

## 2018-11-05 ENCOUNTER — Encounter (HOSPITAL_COMMUNITY): Payer: Self-pay | Admitting: Family Medicine

## 2018-11-05 DIAGNOSIS — E785 Hyperlipidemia, unspecified: Secondary | ICD-10-CM | POA: Diagnosis present

## 2018-11-05 DIAGNOSIS — K922 Gastrointestinal hemorrhage, unspecified: Secondary | ICD-10-CM | POA: Diagnosis present

## 2018-11-05 DIAGNOSIS — E875 Hyperkalemia: Secondary | ICD-10-CM | POA: Diagnosis not present

## 2018-11-05 DIAGNOSIS — Z87891 Personal history of nicotine dependence: Secondary | ICD-10-CM | POA: Diagnosis not present

## 2018-11-05 DIAGNOSIS — K921 Melena: Secondary | ICD-10-CM | POA: Diagnosis not present

## 2018-11-05 DIAGNOSIS — Z9049 Acquired absence of other specified parts of digestive tract: Secondary | ICD-10-CM | POA: Diagnosis not present

## 2018-11-05 DIAGNOSIS — K91840 Postprocedural hemorrhage and hematoma of a digestive system organ or structure following a digestive system procedure: Secondary | ICD-10-CM | POA: Diagnosis not present

## 2018-11-05 DIAGNOSIS — I10 Essential (primary) hypertension: Secondary | ICD-10-CM

## 2018-11-05 DIAGNOSIS — D62 Acute posthemorrhagic anemia: Secondary | ICD-10-CM | POA: Diagnosis not present

## 2018-11-05 DIAGNOSIS — C8200 Follicular lymphoma grade I, unspecified site: Secondary | ICD-10-CM | POA: Diagnosis not present

## 2018-11-05 DIAGNOSIS — R944 Abnormal results of kidney function studies: Secondary | ICD-10-CM | POA: Diagnosis not present

## 2018-11-05 DIAGNOSIS — Z9181 History of falling: Secondary | ICD-10-CM | POA: Diagnosis not present

## 2018-11-05 DIAGNOSIS — E8809 Other disorders of plasma-protein metabolism, not elsewhere classified: Secondary | ICD-10-CM | POA: Diagnosis present

## 2018-11-05 DIAGNOSIS — Z8572 Personal history of non-Hodgkin lymphomas: Secondary | ICD-10-CM | POA: Diagnosis not present

## 2018-11-05 DIAGNOSIS — E1165 Type 2 diabetes mellitus with hyperglycemia: Secondary | ICD-10-CM | POA: Diagnosis not present

## 2018-11-05 DIAGNOSIS — Y836 Removal of other organ (partial) (total) as the cause of abnormal reaction of the patient, or of later complication, without mention of misadventure at the time of the procedure: Secondary | ICD-10-CM | POA: Diagnosis present

## 2018-11-05 DIAGNOSIS — R799 Abnormal finding of blood chemistry, unspecified: Secondary | ICD-10-CM

## 2018-11-05 DIAGNOSIS — C180 Malignant neoplasm of cecum: Secondary | ICD-10-CM | POA: Diagnosis not present

## 2018-11-05 DIAGNOSIS — E861 Hypovolemia: Secondary | ICD-10-CM | POA: Diagnosis not present

## 2018-11-05 DIAGNOSIS — F419 Anxiety disorder, unspecified: Secondary | ICD-10-CM | POA: Diagnosis present

## 2018-11-05 DIAGNOSIS — N529 Male erectile dysfunction, unspecified: Secondary | ICD-10-CM | POA: Diagnosis present

## 2018-11-05 DIAGNOSIS — I951 Orthostatic hypotension: Secondary | ICD-10-CM | POA: Diagnosis not present

## 2018-11-05 DIAGNOSIS — E871 Hypo-osmolality and hyponatremia: Secondary | ICD-10-CM | POA: Diagnosis present

## 2018-11-05 DIAGNOSIS — Z7984 Long term (current) use of oral hypoglycemic drugs: Secondary | ICD-10-CM | POA: Diagnosis not present

## 2018-11-05 DIAGNOSIS — M1990 Unspecified osteoarthritis, unspecified site: Secondary | ICD-10-CM | POA: Diagnosis present

## 2018-11-05 DIAGNOSIS — Z91041 Radiographic dye allergy status: Secondary | ICD-10-CM | POA: Diagnosis not present

## 2018-11-05 DIAGNOSIS — Z9221 Personal history of antineoplastic chemotherapy: Secondary | ICD-10-CM | POA: Diagnosis not present

## 2018-11-05 DIAGNOSIS — E778 Other disorders of glycoprotein metabolism: Secondary | ICD-10-CM | POA: Diagnosis present

## 2018-11-05 DIAGNOSIS — E1136 Type 2 diabetes mellitus with diabetic cataract: Secondary | ICD-10-CM | POA: Diagnosis not present

## 2018-11-05 HISTORY — PX: ESOPHAGOGASTRODUODENOSCOPY (EGD) WITH PROPOFOL: SHX5813

## 2018-11-05 HISTORY — DX: Gastrointestinal hemorrhage, unspecified: K92.2

## 2018-11-05 LAB — APTT: aPTT: 27 seconds (ref 24–36)

## 2018-11-05 LAB — CBC WITH DIFFERENTIAL/PLATELET
ABS IMMATURE GRANULOCYTES: 0.06 10*3/uL (ref 0.00–0.07)
Basophils Absolute: 0 10*3/uL (ref 0.0–0.1)
Basophils Relative: 0 %
Eosinophils Absolute: 0.1 10*3/uL (ref 0.0–0.5)
Eosinophils Relative: 1 %
HCT: 21 % — ABNORMAL LOW (ref 39.0–52.0)
Hemoglobin: 6.8 g/dL — CL (ref 13.0–17.0)
IMMATURE GRANULOCYTES: 1 %
Lymphocytes Relative: 21 %
Lymphs Abs: 1.9 10*3/uL (ref 0.7–4.0)
MCH: 28.8 pg (ref 26.0–34.0)
MCHC: 32.4 g/dL (ref 30.0–36.0)
MCV: 89 fL (ref 80.0–100.0)
Monocytes Absolute: 0.5 10*3/uL (ref 0.1–1.0)
Monocytes Relative: 6 %
NEUTROS ABS: 6.6 10*3/uL (ref 1.7–7.7)
NEUTROS PCT: 71 %
Platelets: 356 10*3/uL (ref 150–400)
RBC: 2.36 MIL/uL — ABNORMAL LOW (ref 4.22–5.81)
RDW: 11.9 % (ref 11.5–15.5)
WBC: 9.2 10*3/uL (ref 4.0–10.5)
nRBC: 0 % (ref 0.0–0.2)

## 2018-11-05 LAB — COMPREHENSIVE METABOLIC PANEL
ALT: 16 U/L (ref 0–44)
AST: 14 U/L — ABNORMAL LOW (ref 15–41)
Albumin: 2.5 g/dL — ABNORMAL LOW (ref 3.5–5.0)
Alkaline Phosphatase: 49 U/L (ref 38–126)
Anion gap: 3 — ABNORMAL LOW (ref 5–15)
BUN: 32 mg/dL — ABNORMAL HIGH (ref 6–20)
CO2: 23 mmol/L (ref 22–32)
Calcium: 7.7 mg/dL — ABNORMAL LOW (ref 8.9–10.3)
Chloride: 107 mmol/L (ref 98–111)
Creatinine, Ser: 1.03 mg/dL (ref 0.61–1.24)
GFR calc Af Amer: >60 mL/min (ref >60)
GFR calc non Af Amer: >60 mL/min (ref >60)
Glucose, Bld: 200 mg/dL — ABNORMAL HIGH (ref 70–99)
Potassium: 5.9 mmol/L — ABNORMAL HIGH (ref 3.5–5.1)
Sodium: 133 mmol/L — ABNORMAL LOW (ref 135–145)
Total Bilirubin: 0.3 mg/dL (ref 0.3–1.2)
Total Protein: 4.3 g/dL — ABNORMAL LOW (ref 6.5–8.1)

## 2018-11-05 LAB — PROTIME-INR
INR: 1.08
Prothrombin Time: 13.9 seconds (ref 11.4–15.2)

## 2018-11-05 LAB — HEMOGLOBIN AND HEMATOCRIT, BLOOD
HCT: 24.8 % — ABNORMAL LOW (ref 39.0–52.0)
Hemoglobin: 8.3 g/dL — ABNORMAL LOW (ref 13.0–17.0)

## 2018-11-05 LAB — ABO/RH: ABO/RH(D): O POS

## 2018-11-05 LAB — HIV ANTIBODY (ROUTINE TESTING W REFLEX): HIV Screen 4th Generation wRfx: NONREACTIVE

## 2018-11-05 LAB — PREALBUMIN: Prealbumin: 25.5 mg/dL (ref 18–38)

## 2018-11-05 LAB — GLUCOSE, CAPILLARY
GLUCOSE-CAPILLARY: 188 mg/dL — AB (ref 70–99)
Glucose-Capillary: 153 mg/dL — ABNORMAL HIGH (ref 70–99)
Glucose-Capillary: 139 mg/dL — ABNORMAL HIGH (ref 70–99)
Glucose-Capillary: 124 mg/dL — ABNORMAL HIGH (ref 70–99)

## 2018-11-05 LAB — MAGNESIUM: Magnesium: 1.9 mg/dL (ref 1.7–2.4)

## 2018-11-05 LAB — POTASSIUM: Potassium: 4 mmol/L (ref 3.5–5.1)

## 2018-11-05 LAB — PREPARE RBC (CROSSMATCH)

## 2018-11-05 SURGERY — ESOPHAGOGASTRODUODENOSCOPY (EGD) WITH PROPOFOL
Anesthesia: Monitor Anesthesia Care

## 2018-11-05 MED ORDER — INSULIN ASPART 100 UNIT/ML ~~LOC~~ SOLN
0.0000 [IU] | SUBCUTANEOUS | Status: DC
  Administered 2018-11-05: 1 [IU] via SUBCUTANEOUS
  Administered 2018-11-05: 2 [IU] via SUBCUTANEOUS
  Administered 2018-11-06: 1 [IU] via SUBCUTANEOUS

## 2018-11-05 MED ORDER — INFLUENZA VAC SPLIT QUAD 0.5 ML IM SUSY
0.5000 mL | PREFILLED_SYRINGE | INTRAMUSCULAR | Status: DC
  Filled 2018-11-05: qty 0.5

## 2018-11-05 MED ORDER — PROPOFOL 1000 MG/100ML IV EMUL
INTRAVENOUS | Status: AC
  Filled 2018-11-05: qty 100

## 2018-11-05 MED ORDER — CALCIUM GLUCONATE-NACL 1-0.675 GM/50ML-% IV SOLN
1.0000 g | Freq: Once | INTRAVENOUS | Status: AC
  Administered 2018-11-05: 1000 mg via INTRAVENOUS
  Filled 2018-11-05: qty 50

## 2018-11-05 MED ORDER — GLUCERNA SHAKE PO LIQD
237.0000 mL | Freq: Two times a day (BID) | ORAL | Status: DC
  Administered 2018-11-07 – 2018-11-08 (×3): 237 mL via ORAL

## 2018-11-05 MED ORDER — PROPOFOL 10 MG/ML IV BOLUS
INTRAVENOUS | Status: DC | PRN
  Administered 2018-11-05: 20 mg via INTRAVENOUS
  Administered 2018-11-05: 50 mg via INTRAVENOUS
  Administered 2018-11-05: 30 mg via INTRAVENOUS
  Administered 2018-11-05: 20 mg via INTRAVENOUS
  Administered 2018-11-05: 50 mg via INTRAVENOUS

## 2018-11-05 MED ORDER — LIDOCAINE 2% (20 MG/ML) 5 ML SYRINGE
INTRAMUSCULAR | Status: DC | PRN
  Administered 2018-11-05: 60 mg via INTRAVENOUS

## 2018-11-05 MED ORDER — ADULT MULTIVITAMIN W/MINERALS CH
1.0000 | ORAL_TABLET | Freq: Every day | ORAL | Status: DC
  Administered 2018-11-06 – 2018-11-08 (×3): 1 via ORAL
  Filled 2018-11-05 (×3): qty 1

## 2018-11-05 MED ORDER — SODIUM CHLORIDE 0.9% IV SOLUTION
Freq: Once | INTRAVENOUS | Status: AC
  Administered 2018-11-05: 05:00:00 via INTRAVENOUS

## 2018-11-05 MED ORDER — SODIUM CHLORIDE 0.9 % IV SOLN: INTRAVENOUS | Status: DC

## 2018-11-05 SURGICAL SUPPLY — 14 items

## 2018-11-05 NOTE — Progress Notes (Addendum)
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  GOAL:   Patient will meet greater than or equal to 90% of their needs  MONITOR:   PO intake, Supplement acceptance, Diet advancement, Labs, Weight trends, Skin, I & O's  REASON FOR ASSESSMENT:   Consult Assessment of nutrition requirement/status  ASSESSMENT:   Blake Vaughn is a 57 y.o. 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  Pt admitted with BRBPR with rt hemicolectomy.   Spoke with pt and family at bedside. Pt reports good appetite and expresses hunger due to NPO status. When asked about intake, pt replied "I'll eat the whole bird or the whole fish". Family disagrees with this statement and reports decreased oral intake over the past two weeks since last hospitalization. Pt still eats, however, has been consuming decreased amounts (about 50% of what he usually eats). They also endorse a 30# wt loss over the past two weeks, however, this is not consistent with wt hx (wt has been stable over the past month).   Last Hgb A1c: 9.0 (10/22/18). PTA DM medications are 10 mg glipizide BID and 1000 mg metformin BID.   Albumin has a half-life of 21 days and is strongly affected by stress response and inflammatory process, therefore, do not expect to see an improvement in this lab value during acute hospitalization. When a patient presents with low albumin, it is likely skewed due to the acute inflammatory response.  Unless it is suspected that patient had poor PO intake  or malnutrition prior to admission, then RD should not be consulted solely for low albumin. Note that low albumin is no longer used to diagnose malnutrition; [place] uses the new malnutrition guidelines published by the American Society for Parenteral and Enteral Nutrition (A.S.P.E.N.) and the Academy of Nutrition and Dietetics (AND).    Pt does not meet criteria for malnutrition at this time, however, will add supplements due to increased nutritional needs.   Labs reviewed: Na: 133, K: 5.9, CBGS: 153 (inpatient orders for glycemic control are 0-9 units insulin aspart every 4 hours).   NUTRITION - FOCUSED PHYSICAL EXAM:    Most Recent Value  Orbital Region  No depletion  Upper Arm Region  No depletion  Thoracic and Lumbar Region  No depletion  Buccal Region  No depletion  Temple Region  No depletion  Clavicle Bone Region  No depletion  Clavicle and Acromion Bone Region  No depletion  Scapular Bone Region  No depletion  Dorsal Hand  No depletion  Patellar Region  No depletion  Anterior Thigh Region  No depletion  Posterior Calf Region  No depletion  Edema (RD Assessment)  None  Hair  Reviewed  Eyes  Reviewed  Mouth  Reviewed  Skin  Reviewed  Nails  Reviewed       Diet Order:   Diet Order            Diet NPO time specified  Diet effective now              EDUCATION NEEDS:   No education needs have been identified  at this time  Skin:  Skin Assessment: Skin Integrity Issues: Skin Integrity Issues:: Incisions Incisions: closed abdomen  Last BM:  11/04/18  Height:   Ht Readings from Last 1 Encounters:  11/04/18 5\' 2"  (1.575 m)    Weight:   Wt Readings from Last 1 Encounters:  11/04/18 90.7 kg    Ideal Body Weight:  53.6 kg  BMI:  Body mass index is 36.57 kg/m.  Estimated Nutritional Needs:   Kcal:  1700-1900  Protein:  80-95 grams  Fluid:  1.7-1.9 L    Ellaina Schuler A. Jimmye Norman, RD, LDN, CDE Pager: [phone] After hours Pager: [phone]

## 2018-11-05 NOTE — Progress Notes (Addendum)
Family Medicine Teaching Service Daily Progress Note Intern Pager: [phone]  Patient name: Blake Vaughn Medical record number: [id number] Date of birth: 07-07-1961 Age: 57 y.o. Gender: male  Primary Care Provider: [name], [name], DO Consultants: GI Code Status: FULL  Pt Overview and Major Events to Date:  11/28 Patient admitted for [facility] after recent hemicolectomy and visualization of mass on appendix 11/29 Hemoglobin 6.8 after admission Hgb 9.6  Assessment and Plan:  Blake Vaughn is a 57 y.o. male presenting with GIB s/p right colectomy on 10/21/2018.Marland Kitchen PMH is significant for diabetes type 2, hypercholesterolemia, HTN, follicular lymphoma (in remission), cecal mass status post right hemicolectomy.  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  Also concern for post-surgical bleeding, although surgery was consulted in the ED and said that this was highly unlikely.  Baseline Hgb is normal at around 13, was admitted with a hemoglobin of 9.6, and Hgb has dropped precipitously to 6.8 on 11/29.  There is likely a component of dilution given his recent fluid administration, but the amount of his GI blood loss is significant.  He is receiving two units of pRBCs. -post-transfusion H/H -NPO for EGD 11/29 -mIVF 137ml/hr while NPO -Out of bed with assistance, fall risk -GI consult: PPI, scope 11/29, may need tagged RBC scan after  Hyperkalemia: Potassium of 5.9 on BMP 11/29, unsure if due to hemolysis in vivo or in vitro. - recheck BMP  Syncopal episode, likely orthostatic/vasovagal:  Patient's hypovolemia and anxiety are likely contributing.  No other syncopal events have been recorded since patient was in the ED. -Continue maintenance IV fluids  -Continuous cardiac monitoring -Out of bed with  assistance, fall risk  Diabetes type 2: Patient takes glipizide and metformin at home.  Most recent glucose was 153. -Hold home oral medications, will hold while inpatient -sSSI Q4H while NPO  Hypertension:  At home, patient on 12.5 mg metoprolol twice daily.  Last BP wnl at 131/69.  Pulse is in the high 90s. -Hold metoprolol in setting of low blood pressures and recent syncopal episode. -can consider restarting metoprolol if no syncope 11/29 and HR remains high-normal  HLD: on atorvastatin at home. -Hold atorvastatin while patient n.p.o.  Recent appendectomy complicated by cecal mass, s/p right colectomy:  Patient was hospitalized for recent appendectomy on 10/21/2018. A pedicular mass was discovered during laparoscopy. Per surgical notes, patient required right hemicolectomy as bowel showed signs of ischemia s/p removal of mass.  Specimen sent to surgical pathology and has not returned.  Patient was referred to oncology on discharge, but has yet to follow-up.  It would benefit patient to have oncology follow-up scheduled prior to discharge to avoid patient being lost to follow-up. -Consult case management -Follow up heme/onc consult placed on discharge  History of follicular lymphoma, in remission:  Patient reports history of lymphoma and chemotherapy from 2006-2012 (or so, per patient).  He followed with Dr. Julien Nordmann. Looks like referral was entered and approved at last admission, unclear why patient has not heard from oncology yet (per his report). Will make sure follow up with oncology in place prior to discharge.  FEN/GI: NPO, NS @ 100 ml/hr PPx: SCD  Disposition: continue on telemetry  Subjective:  Patient reports that he has not had a bowel movement overnight, so he has had no further melena or hematochezia.  He is not currently  in any pain and is tolerating the blood transfusion well.  Objective: Temp:  [98.1 F (36.7 C)-98.7 F (37.1 C)] 98.3 F (36.8 C) (11/29  0811) Pulse Rate:  [92-109] 93 (11/29 0811) Resp:  [14-20] 14 (11/29 0811) BP: (99-144)/(57-95) 131/69 (11/29 0811) SpO2:  [97 %-100 %] 99 % (11/29 0811) Weight:  [90.7 kg] 90.7 kg (11/28 1901) Physical Exam: General: comfortably lying in bed receiving blood transfusion.  Room smells melanotic. Cardiovascular: RRR, no MRG Respiratory: CTAB, no increased work of breathing Abdomen: healing periumbilical incision, nontender to palpation, soft Extremities: no edema, well perfused  Laboratory: Recent Labs  Lab 11/04/18 1903 11/05/18 0303  WBC 8.8 9.2  HGB 9.6* 6.8*  HCT 29.3* 21.0*  PLT 415* 356   Recent Labs  Lab 11/04/18 1903 11/05/18 0303  NA 135 133*  K 5.2* 5.9*  CL 102 107  CO2 24 23  BUN 34* 32*  CREATININE 1.02 1.03  CALCIUM 8.6* 7.7*  PROT  --  4.3*  BILITOT  --  0.3  ALKPHOS  --  49  ALT  --  16  AST  --  14*  GLUCOSE 255* 200*    Imaging/Diagnostic Tests: Ct Abdomen Pelvis Wo Contrast  Result Date: 11/04/2018 CLINICAL DATA:  Abdomen pain, rectal bleeding EXAM: CT ABDOMEN AND PELVIS WITHOUT CONTRAST TECHNIQUE: Multidetector CT imaging of the abdomen and pelvis was performed following the standard protocol without IV contrast. COMPARISON:  CT 10/21/2018, 09/09/2016 FINDINGS: Lower chest: Lung bases demonstrate no acute consolidation or effusion. Heart size is normal. Calcified granuloma in the right middle lobe. Hepatobiliary: No focal liver abnormality is seen. No gallstones, gallbladder wall thickening, or biliary dilatation. Pancreas: Unremarkable. No pancreatic ductal dilatation or surrounding inflammatory changes. Spleen: Normal in size without focal abnormality. Adrenals/Urinary Tract: Adrenal glands are unremarkable. Kidneys are normal, without renal calculi, focal lesion, or hydronephrosis. Bladder is unremarkable. Stomach/Bowel: Stomach is nonenlarged. No dilated small bowel. Diffuse fluid within the colon without wall thickening. Interval appendectomy.  Residual soft tissue thickening and inflammatory changes in the right lower quadrant adjacent to the cecum. Vascular/Lymphatic: Mild aortic atherosclerosis. No aneurysm. Small retroperitoneal nodes Reproductive: Prostate is unremarkable. Other: No free air Musculoskeletal: No acute or significant osseous findings. IMPRESSION: 1. Interval appendectomy. There is residual soft tissue thickening and stranding in the right lower quadrant adjacent to the cecum which may represent ongoing inflammation versus postsurgical changes. A small soft tissue density adjacent to the surgical sutures may reflect small hematoma or operative collection. No large focal fluid collection to suggest drainable abscess allowing for absence of contrast 2. Fluid-filled colon without wall thickening, could reflect diarrheal process Electronically Signed   By: Donavan Foil M.D.   On: 11/04/2018 20:40   Ct Abdomen Pelvis Wo Contrast  Result Date: 10/21/2018 CLINICAL DATA:  Abdominal pain.  Suspect appendicitis EXAM: CT ABDOMEN AND PELVIS WITHOUT CONTRAST TECHNIQUE: Multidetector CT imaging of the abdomen and pelvis was performed following the standard protocol without IV contrast. COMPARISON:  CT abdomen pelvis 09/09/2016 FINDINGS: Lower chest: Calcified granuloma right middle lobe. Lung bases clear. Hepatobiliary: No focal liver abnormality is seen. No gallstones, gallbladder wall thickening, or biliary dilatation. Pancreas: Negative Spleen: Negative Adrenals/Urinary Tract: Adrenal glands are unremarkable. Kidneys are normal, without renal calculi, focal lesion, or hydronephrosis. Bladder is unremarkable. 1 cm left upper pole renal cyst unchanged from the prior study. Stomach/Bowel: Normal stomach.  Negative for bowel obstruction. Dilated appendix 15 mm. Fluid-filled appendix with periappendiceal stranding compatible with acute appendicitis. No abscess  or appendicoliths identified. Vascular/Lymphatic: Mild atherosclerotic disease in the  aorta. Negative for lymphadenopathy. Reproductive: Prostate enlargement. Other: No free fluid or free air. Musculoskeletal: No acute skeletal abnormality. IMPRESSION: Acute appendicitis without abscess or appendicoliths. No evidence of perforation. Electronically Signed   By: Franchot Gallo M.D.   On: 10/21/2018 19:29   Dg Shoulder Right  Result Date: 10/17/2018 CLINICAL DATA:  Pain. EXAM: RIGHT SHOULDER - 2+ VIEW COMPARISON:  None. FINDINGS: Glenohumeral degenerative changes. No fracture or other acute abnormality. IMPRESSION: Degenerative changes. Electronically Signed   By: Dorise Bullion III M.D   On: 10/17/2018 21:41     Kathrene Alu, MD 11/05/2018, 8:46 AM PGY-2, [facility] Intern pager: [phone], text pages welcome

## 2018-11-05 NOTE — Consult Note (Signed)
Referring Provider: Family practice teaching service (Dr. Sherren Mocha McDiarmid, attending) Primary Care Physician:  [name], [name], DO Primary Gastroenterologist: None (unassigned)  Reason for Consultation: Hematochezia  HPI: Blake Vaughn is a 57 y.o. 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  In the emergency room, his hemoglobin was 9.6, as compared to 12.6 when checked 12 days earlier; overnight it has dropped to 6.8.  BUN is slightly elevated at 32, as compared to 8 when checked 2 weeks earlier.  The patient apparently does use some aspirin for arthritis, although the amount is unclear.  No prodromal dyspeptic symptoms, at least that I can identify.  Not currently a smoker.  To my knowledge, no prior history of GI bleeding.   Past Medical History:  Diagnosis Date  . Appendicitis 10/21/2018  . Diabetes mellitus   . 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 Hypercholesterolemia   . Hypertension 11/27/2011  . Lymphoma [facility])    gets annual chemo last tx Feb 2013  . TOBACCO USE, QUIT 08/18/2009   Qualifier: Diagnosis of  By: Drue Flirt  MD, Merrily Brittle      Past Surgical History:  Procedure Laterality Date  . LAPAROSCOPIC APPENDECTOMY N/A 10/21/2018   Procedure: Exploratory laparotomy right hemicolectomy;  Surgeon: [name], MD;  Location: [place];  Service: General;  Laterality: N/A;    Prior to Admission  medications   Medication Sig Start Date End Date Taking? Authorizing Provider  acetaminophen (TYLENOL) 500 MG tablet Take 2 tablets (1,000 mg total) by mouth every 6 (six) hours as needed. 10/28/18  Yes Meuth, Blaine Hamper, [name]  aspirin EC 81 MG tablet Take 1 tablet (81 mg total) by mouth daily. 07/16/17  Yes Enid Derry, [name], DO  atorvastatin (LIPITOR) 10 MG tablet Take 1 tablet (10 mg total) by mouth daily. 07/16/17  Yes Enid Derry, [name], DO  glipiZIDE (GLUCOTROL) 10 MG tablet TAKE 1 TABLET BY MOUTH TWICE DAILY BEFORE MEAL(S) Patient taking differently: Take 10 mg by mouth 2 (two) times daily.  06/07/18  Yes Enid Derry, [name], DO  metFORMIN (GLUCOPHAGE) 1000 MG tablet Take 1 tablet (1,000 mg total) by mouth 2 (two) times daily with a meal. 07/16/17  Yes Enid Derry, [name], DO  metoprolol tartrate (LOPRESSOR) 25 MG tablet Take 0.5 tablets (12.5 mg total) by mouth 2 (two) times daily. 10/28/18  Yes Meuth, [name], [name]  Alcohol Swabs PADS Use as instructed 09/05/16   Schorr, [name], NP  Blood Glucose Monitoring Suppl (RELION PRIME MONITOR) DEVI Use device as instructed 04/14/16   [name], DO  docusate sodium (COLACE) 100 MG capsule Take 1 capsule (100 mg total) by mouth 2 (two) times daily. Patient not taking: Reported on 11/04/2018 10/28/18   [name], [name]  feeding supplement (ENSURE SURGERY) LIQD Take 237 mLs by mouth 2 (two) times daily between meals. Patient not taking: Reported on 11/04/2018 10/28/18   [name], [name]  glucose blood test strip Use as instructed  04/14/16   [name], DO  oxyCODONE (OXY IR/ROXICODONE) 5 MG immediate release tablet Take 1 tablet (5 mg total) by mouth every 4 (four) hours as needed for severe pain. Patient not taking: Reported on 11/04/2018 10/28/18   [name], [name]  polyethylene glycol (MIRALAX / GLYCOLAX) packet Take 17 g by mouth daily. Patient not taking: Reported on 11/04/2018 10/28/18   [name], [name]  RELION LANCETS STANDARD 21G  MISC Use as instructed 04/14/16   [name], DO  saccharomyces boulardii (FLORASTOR) 250 MG capsule Take 1 capsule (250 mg total) by mouth 2 (two) times daily. Patient not taking: Reported on 11/04/2018 10/28/18   [name], [name]    Current Facility-Administered Medications  Medication Dose Route Frequency Provider Last Rate Last Dose  . 0.9 %  sodium chloride infusion   Intravenous Continuous [name], MD 100 mL/hr at 11/05/18 0618    . acetaminophen (TYLENOL) tablet 650 mg  650 mg Oral Q6H PRN [name], MD   650 mg at 11/05/18 9381   Or  . acetaminophen (TYLENOL) suppository 650 mg  650 mg Rectal Q6H PRN [name], MD      . insulin aspart (novoLOG) injection 0-9 Units  0-9 Units Subcutaneous Q4H Winfrey, [name], MD      . ondansetron (ZOFRAN) tablet 4 mg  4 mg Oral Q6H PRN [name], MD       Or  . ondansetron [facility]) injection 4 mg  4 mg Intravenous Q6H PRN [name], MD      . pantoprazole (PROTONIX) injection 40 mg  40 mg Intravenous [name], MD   40 mg at 11/05/18 1020    Allergies as of 11/04/2018 - Review Complete 11/04/2018  Allergen Reaction Noted  . Iohexol Other (See Comments) 08/16/2009    History reviewed. No pertinent family history.  Social History   Socioeconomic History  . Marital status: Married    Spouse name: Not on file  . Number of children: Not on file  . Years of education: Not on file  . Highest education level: Not on file  Occupational History  . Not on file  Social Needs  . Financial resource strain: Not on file  . Food insecurity:    Worry: Not on file    Inability: Not on file  . Transportation needs:    Medical: Not on file    Non-medical: Not on file  Tobacco Use  . Smoking status: Former Smoker    Last attempt to quit: 12/08/2006    Years since quitting: 11.9  . Smokeless tobacco: Former Systems developer    Quit date: 11/20/2006  Substance and Sexual Activity  . Alcohol use: No  . Drug use:  No  . Sexual activity: Not on file  Lifestyle  . Physical activity:    Days per week: Not on file    Minutes per session: Not on file  . Stress: Not on file  Relationships  . Social connections:    Talks on phone: Not on file    Gets together: Not on file    Attends religious service: Not on file    Active member of club or organization: Not on file    Attends meetings of clubs or organizations: Not on file    Relationship status: Not on file  . Intimate partner violence:    Fear of current or ex partner: Not on file    Emotionally abused: Not on file  Physically abused: Not on file    Forced sexual activity: Not on file  Other Topics Concern  . Not on file  Social History Narrative  . Not on file    Review of Systems: See HPI  Physical Exam: Vital signs in last 24 hours: Temp:  [98.1 F (36.7 C)-98.8 F (37.1 C)] 98.8 F (37.1 C) (11/29 1045) Pulse Rate:  [89-109] 89 (11/29 1045) Resp:  [12-20] 12 (11/29 1045) BP: (99-144)/(57-95) 142/71 (11/29 1045) SpO2:  [97 %-100 %] 99 % (11/29 0811) Weight:  [90.7 kg] 90.7 kg (11/28 1901) Last BM Date: 11/04/18 General:   Alert,  Well-developed, well-nourished, pleasant and cooperative in NAD.  There is an odor of melena in the room. Head:  Normocephalic and atraumatic. Eyes:  Sclera clear, no icterus.   Conjunctiva pale. Neck:   No masses or thyromegaly. Lungs:  Clear throughout to auscultation.   No wheezes, crackles, or rhonchi. No evident respiratory distress. Heart:   Regular rate and rhythm; no murmurs, clicks, rubs,  or gallops. Abdomen:  Soft, nontender, nontympanitic, and nondistended. No masses, hepatosplenomegaly or ventral hernias noted.  Low midline incision appears to be healing well. Msk:   Symmetrical without gross deformities. Extremities:   Without clubbing, cyanosis, or edema. Neurologic:  Alert and coherent;  grossly normal neurologically. Skin:  Intact without significant lesions or rashes. Cervical  Nodes:  No significant cervical adenopathy. Psych:   Alert and cooperative. Normal mood and affect.  Intake/Output from previous day: 11/28 0701 - 11/29 0700 In: 2864.2 [I.V.:697.8; Blood:310; IV Piggyback:1856.5] Out: 300 [Urine:300] Intake/Output this shift: Total I/O In: 620 [Blood:620] Out: -   Lab Results: Recent Labs    11/04/18 1903 11/05/18 0303  WBC 8.8 9.2  HGB 9.6* 6.8*  HCT 29.3* 21.0*  PLT 415* 356   BMET Recent Labs    11/04/18 1903 11/05/18 0303  NA 135 133*  K 5.2* 5.9*  CL 102 107  CO2 24 23  GLUCOSE 255* 200*  BUN 34* 32*  CREATININE 1.02 1.03  CALCIUM 8.6* 7.7*   LFT Recent Labs    11/05/18 0303  PROT 4.3*  ALBUMIN 2.5*  AST 14*  ALT 16  ALKPHOS 49  BILITOT 0.3   PT/INR Recent Labs    11/05/18 0303  LABPROT 13.9  INR 1.08    Studies/Results: Ct Abdomen Pelvis Wo Contrast  Result Date: 11/04/2018 CLINICAL DATA:  Abdomen pain, rectal bleeding EXAM: CT ABDOMEN AND PELVIS WITHOUT CONTRAST TECHNIQUE: Multidetector CT imaging of the abdomen and pelvis was performed following the standard protocol without IV contrast. COMPARISON:  CT 10/21/2018, 09/09/2016 FINDINGS: Lower chest: Lung bases demonstrate no acute consolidation or effusion. Heart size is normal. Calcified granuloma in the right middle lobe. Hepatobiliary: No focal liver abnormality is seen. No gallstones, gallbladder wall thickening, or biliary dilatation. Pancreas: Unremarkable. No pancreatic ductal dilatation or surrounding inflammatory changes. Spleen: Normal in size without focal abnormality. Adrenals/Urinary Tract: Adrenal glands are unremarkable. Kidneys are normal, without renal calculi, focal lesion, or hydronephrosis. Bladder is unremarkable. Stomach/Bowel: Stomach is nonenlarged. No dilated small bowel. Diffuse fluid within the colon without wall thickening. Interval appendectomy. Residual soft tissue thickening and inflammatory changes in the right lower quadrant adjacent  to the cecum. Vascular/Lymphatic: Mild aortic atherosclerosis. No aneurysm. Small retroperitoneal nodes Reproductive: Prostate is unremarkable. Other: No free air Musculoskeletal: No acute or significant osseous findings. IMPRESSION: 1. Interval appendectomy. There is residual soft tissue thickening and stranding in the right lower quadrant adjacent to the cecum  which may represent ongoing inflammation versus postsurgical changes. A small soft tissue density adjacent to the surgical sutures may reflect small hematoma or operative collection. No large focal fluid collection to suggest drainable abscess allowing for absence of contrast 2. Fluid-filled colon without wall thickening, could reflect diarrheal process Electronically Signed   By: Donavan Foil M.D.   On: 11/04/2018 20:40    Impression: 1.  Recurrent hematochezia without significant prodromal abdominal symptomatology 2.  Elevated BUN and odor of melena suggesting possible upper tract source 3.  Severe posthemorrhagic anemia (currently being transfused) 4.  Recent surgery for adenocarcinoma of the cecum (patient apparently not aware of final histologic diagnosis, but apparently a referral to oncology has been made, since there is an encounter listed with the oncology navigator).  No obvious metastatic disease on recent CT scan.  Plan: We will begin with upper endoscopy later today.  The nature, purpose, and risks of the procedure were reviewed with the patient and he is agreeable.  Further management to depend on those findings.  If it is unrevealing for a source of his bleeding, the next step would probably be a nuclear medicine tagged red cell bleeding scan to try to better localize the source of bleeding.  Note that there was no mention of diverticulosis of diverticulosis on his recent CT scan.  I have spoken with Dr. Kaylyn Lim of general surgery who indicates it would be unusual for patient to be bleeding from an anastomotic source this far  out from his intestinal surgery.   LOS: 0 days   Youlanda Mighty Krystall Kruckenberg  11/05/2018, 11:05 AM   Pager [phone] If no answer or after 5 PM call [phone]

## 2018-11-05 NOTE — Transfer of Care (Signed)
Immediate Anesthesia Transfer of Care Note  Patient: Blake Vaughn  Procedure(s) Performed: ESOPHAGOGASTRODUODENOSCOPY (EGD) WITH PROPOFOL (N/A )  Patient Location: Endoscopy Unit  Anesthesia Type:MAC  Level of Consciousness: drowsy and patient cooperative  Airway & Oxygen Therapy: Patient Spontanous Breathing and Patient connected to nasal cannula oxygen  Post-op Assessment: Report given to RN and Post -op Vital signs reviewed and stable  Post vital signs: Reviewed and stable  Last Vitals:  Vitals Value Taken Time  BP 132/56   Temp    Pulse 85 11/05/2018  3:41 PM  Resp 24 11/05/2018  3:41 PM  SpO2 99 % 11/05/2018  3:41 PM  Vitals shown include unvalidated device data.  Last Pain:  Vitals:   11/05/18 1351  TempSrc: Oral  PainSc: 0-No pain         Complications: No apparent anesthesia complications

## 2018-11-05 NOTE — Plan of Care (Signed)
?  Problem: Coping: ?Goal: Level of anxiety will decrease ?Outcome: Progressing ?  ?Problem: Safety: ?Goal: Ability to remain free from injury will improve ?Outcome: Progressing ?  ?

## 2018-11-05 NOTE — Progress Notes (Addendum)
CRITICAL VALUE ALERT  Critical Value:  Hgb 6.8   Date & Time Notied:  11.29.19 0330   Provider Notified: yes  Orders Received/Actions taken 2 units PRBC ordered

## 2018-11-05 NOTE — Anesthesia Preprocedure Evaluation (Addendum)
Anesthesia Evaluation  Patient identified by MRN, date of birth, ID band Patient awake    Reviewed: Allergy & Precautions, NPO status , Patient's Chart, lab work & pertinent test results  Airway Mallampati: II  TM Distance: >3 FB Neck ROM: Full    Dental  (+) Teeth Intact   Pulmonary former smoker,    breath sounds clear to auscultation       Cardiovascular hypertension,  Rhythm:Regular Rate:Normal     Neuro/Psych    GI/Hepatic   Endo/Other  diabetes  Renal/GU      Musculoskeletal   Abdominal   Peds  Hematology   Anesthesia Other Findings   Reproductive/Obstetrics                            Anesthesia Physical Anesthesia Plan  ASA: III  Anesthesia Plan: MAC   Post-op Pain Management:    Induction: Intravenous  PONV Risk Score and Plan: Propofol infusion  Airway Management Planned: Natural Airway and Nasal Cannula  Additional Equipment:   Intra-op Plan:   Post-operative Plan:   Informed Consent: I have reviewed the patients History and Physical, chart, labs and discussed the procedure including the risks, benefits and alternatives for the proposed anesthesia with the patient or authorized representative who has indicated his/her understanding and acceptance.     Plan Discussed with: CRNA and Anesthesiologist  Anesthesia Plan Comments:         Anesthesia Quick Evaluation

## 2018-11-05 NOTE — Progress Notes (Addendum)
2300 received patient from ED, A&O x4. Denies pain, family at bedside. Will continue to monitor.  0115 patient complaining of arthritis pain in R shoulder. Take OTC med at home but does not remember name. Will have family bring in tomorrow; nurse advised some OTC meds in high doses can contribute to bleeding.   0420 discussed consent for blood. Patient is "scared of taking blood, I'm afraid I will die."  Patient request we ask the doctor if there was any other option, "I think it has stopped because I had gas and I didn't have bleeding."  Continued education and paged provider. Ordered EKG d/t K high. Advised nurse to tell patient levels dropped by half and there is no way to get levels up as fast as necessary.

## 2018-11-05 NOTE — Progress Notes (Signed)
2nd unit of prbcs transfused with no s/s of reaction noted

## 2018-11-05 NOTE — Anesthesia Procedure Notes (Signed)
Procedure Name: MAC Date/Time: 11/05/2018 3:19 PM Performed by: Jearld Pies, CRNA Pre-anesthesia Checklist: Patient identified, Emergency Drugs available, Suction available, Patient being monitored and Timeout performed Patient Re-evaluated:Patient Re-evaluated prior to induction Oxygen Delivery Method: Simple face mask Preoxygenation: Pre-oxygenation with 100% oxygen

## 2018-11-05 NOTE — Interval H&P Note (Signed)
History and Physical Interval Note:  11/05/2018 2:54 PM  Blake Vaughn  has presented today for surgery, with the diagnosis of hematochezia  The various methods of treatment have been discussed with the patient. After consideration of risks, benefits and other options for treatment, the patient has consented to  Procedure(s): ESOPHAGOGASTRODUODENOSCOPY (EGD) WITH PROPOFOL (N/A) as a surgical intervention .  The patient's history has been reviewed, patient examined, no change in status, stable for surgery.  I have reviewed the patient's chart and labs.  Questions were answered to the patient's satisfaction.     Youlanda Mighty [name]

## 2018-11-05 NOTE — Anesthesia Postprocedure Evaluation (Signed)
Anesthesia Post Note  Patient: Blake Vaughn  Procedure(s) Performed: ESOPHAGOGASTRODUODENOSCOPY (EGD) WITH PROPOFOL (N/A )     Patient location during evaluation: Endoscopy Anesthesia Type: MAC Level of consciousness: awake and alert Pain management: pain level controlled Vital Signs Assessment: post-procedure vital signs reviewed and stable Respiratory status: spontaneous breathing, nonlabored ventilation, respiratory function stable and patient connected to nasal cannula oxygen Cardiovascular status: stable and blood pressure returned to baseline Postop Assessment: no apparent nausea or vomiting Anesthetic complications: no    Last Vitals:  Vitals:   11/05/18 1550 11/05/18 1600  BP: (!) 153/75 (!) 162/78  Pulse: 83 86  Resp: (!) 22 (!) 22  Temp:    SpO2: 98% 100%    Last Pain:  Vitals:   11/05/18 1600  TempSrc:   PainSc: 0-No pain                 [name] COKER

## 2018-11-05 NOTE — Op Note (Signed)
[facility] Patient Name: Blake Vaughn Procedure Date : 11/05/2018 MRN: [id number] Attending MD: [name] , MD Date of Birth: 05-14-1961 CSN: [id number] Age: 57 Admit Type: Inpatient Procedure:                Upper GI endoscopy Indications:              Acute post hemorrhagic anemia, Hematochezia in a                            patient 2 wks s/p right hemicolectomy for cecal                            cancer, on low-dose ASA Providers:                [name], MD, Zenon Mayo, RN, Elspeth Cho Tech., Technician, Derinda Sis, CRNA Referring MD:              Medicines:                Monitored Anesthesia Care Complications:            No immediate complications. Estimated Blood Loss:     Estimated blood loss: none. Procedure:                Pre-Anesthesia Assessment:                           - Prior to the procedure, a History and Physical                            was performed, and patient medications and                            allergies were reviewed. The patient's tolerance of                            previous anesthesia was also reviewed. The risks                            and benefits of the procedure and the sedation                            options and risks were discussed with the patient.                            All questions were answered, and informed consent                            was obtained. Prior Anticoagulants: The patient has                            taken aspirin, last dose was 2 days prior to  procedure. ASA Grade Assessment: III - A patient                            with severe systemic disease. After reviewing the                            risks and benefits, the patient was deemed in                            satisfactory condition to undergo the procedure.                           After obtaining informed consent, the endoscope was              passed under direct vision. Throughout the                            procedure, the patient's blood pressure, pulse, and                            oxygen saturations were monitored continuously. The                            GIF-H190 ([id number]) Olympus Adult EGD was introduced                            through the mouth, and advanced to the second part                            of duodenum. The upper GI endoscopy was                            accomplished without difficulty. The patient                            tolerated the procedure well. Scope In: Scope Out: Findings:      The examined esophagus was normal.      The entire examined stomach was normal. It contained a small amount of       bile, but no blood or coffee grounds. There was some minimal non-erosive       prepyloric erythema.      The cardia and gastric fundus were normal on retroflexion.      The examined duodenum was normal. Impression:               - Normal esophagus.                           - Normal stomach.                           - Normal examined duodenum.                           - No specimens collected.                           -  No blood in stomach or bleeding observed at the                            time of this procedure.                           - NO PROSPECTIVE BLEEDING SITE SEEN. The patient's                            recent hematochezia most likely originated distal                            to the duodenum. Recommendation:           - Observe patient's clinical course.                           - Consider bleeding scan if further bleeding occurs. Procedure Code(s):        --- Professional ---                           [phone], Esophagogastroduodenoscopy, flexible,                            transoral; diagnostic, including collection of                            specimen(s) by brushing or washing, when performed                            (separate procedure) Diagnosis  Code(s):        --- Professional ---                           D62, Acute posthemorrhagic anemia                           K92.1, Melena (includes Hematochezia) CPT copyright 2018 American Medical Association. All rights reserved. The codes documented in this report are preliminary and upon coder review may  be revised to meet current compliance requirements. [name], MD 11/05/2018 3:48:57 PM This report has been signed electronically. Number of Addenda: 0

## 2018-11-06 ENCOUNTER — Inpatient Hospital Stay (HOSPITAL_COMMUNITY): Payer: Medicaid Other

## 2018-11-06 DIAGNOSIS — K922 Gastrointestinal hemorrhage, unspecified: Secondary | ICD-10-CM

## 2018-11-06 LAB — BASIC METABOLIC PANEL
Anion gap: 9 (ref 5–15)
BUN: 13 mg/dL (ref 6–20)
CO2: 22 mmol/L (ref 22–32)
Calcium: 8.5 mg/dL — ABNORMAL LOW (ref 8.9–10.3)
Chloride: 107 mmol/L (ref 98–111)
Creatinine, Ser: 0.83 mg/dL (ref 0.61–1.24)
GFR calc Af Amer: >60 mL/min (ref >60)
GFR calc non Af Amer: >60 mL/min (ref >60)
Glucose, Bld: 137 mg/dL — ABNORMAL HIGH (ref 70–99)
POTASSIUM: 3.8 mmol/L (ref 3.5–5.1)
Sodium: 138 mmol/L (ref 135–145)

## 2018-11-06 LAB — CBC
HCT: 26.4 % — ABNORMAL LOW (ref 39.0–52.0)
HCT: 24.9 % — ABNORMAL LOW (ref 39.0–52.0)
HEMOGLOBIN: 8.6 g/dL — AB (ref 13.0–17.0)
Hemoglobin: 8.4 g/dL — ABNORMAL LOW (ref 13.0–17.0)
MCH: 28 pg (ref 26.0–34.0)
MCH: 28.7 pg (ref 26.0–34.0)
MCHC: 32.6 g/dL (ref 30.0–36.0)
MCHC: 33.7 g/dL (ref 30.0–36.0)
MCV: 86 fL (ref 80.0–100.0)
MCV: 85 fL (ref 80.0–100.0)
PLATELETS: 313 10*3/uL (ref 150–400)
Platelets: 318 10*3/uL (ref 150–400)
RBC: 3.07 MIL/uL — ABNORMAL LOW (ref 4.22–5.81)
RBC: 2.93 MIL/uL — ABNORMAL LOW (ref 4.22–5.81)
RDW: 13.2 % (ref 11.5–15.5)
RDW: 12.9 % (ref 11.5–15.5)
WBC: 7.9 10*3/uL (ref 4.0–10.5)
WBC: 6.4 10*3/uL (ref 4.0–10.5)
nRBC: 0 % (ref 0.0–0.2)
nRBC: 0 % (ref 0.0–0.2)

## 2018-11-06 LAB — GLUCOSE, CAPILLARY
Glucose-Capillary: 139 mg/dL — ABNORMAL HIGH (ref 70–99)
Glucose-Capillary: 128 mg/dL — ABNORMAL HIGH (ref 70–99)
Glucose-Capillary: 191 mg/dL — ABNORMAL HIGH (ref 70–99)
Glucose-Capillary: 145 mg/dL — ABNORMAL HIGH (ref 70–99)
Glucose-Capillary: 166 mg/dL — ABNORMAL HIGH (ref 70–99)

## 2018-11-06 LAB — HEMOGLOBIN AND HEMATOCRIT, BLOOD
HCT: 24.1 % — ABNORMAL LOW (ref 39.0–52.0)
Hemoglobin: 7.8 g/dL — ABNORMAL LOW (ref 13.0–17.0)

## 2018-11-06 IMAGING — NM NM GI BLOOD LOSS
2 series · 12 of 12 positions shown · non-contrast
Comparison: CT, [DATE]

CLINICAL DATA: GI bleeding for 2 days.  Recent appendectomy.

EXAM:
NUCLEAR MEDICINE GASTROINTESTINAL BLEEDING SCAN
TECHNIQUE: Sequential abdominal images were obtained following intravenous
administration of [H7] labeled red blood cells.
RADIOPHARMACEUTICALS:  22.0 mCi [H7] pertechnetate in-vitro
labeled red cells.

[gi gi bleed · 4.52mm/px · 6 of 60 frames shown (1 of 2)]
[frame 6/60]
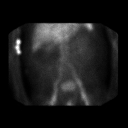
[frame 16/60]
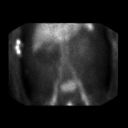
[frame 26/60]
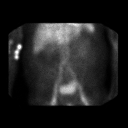
[frame 36/60]
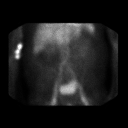
[frame 46/60]
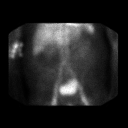
[frame 56/60]
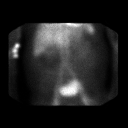

[gi gi bleed · 4.52mm/px · 6 of 60 frames shown (2 of 2)]
[frame 6/60]
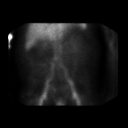
[frame 16/60]
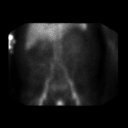
[frame 26/60]
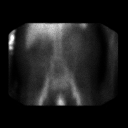
[frame 36/60]
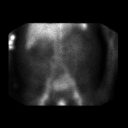
[frame 46/60]
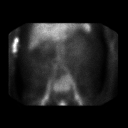
[frame 56/60]
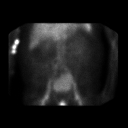

[12 of 12 positions shown; findings below may reference images not displayed]

FINDINGS: There are no areas of abnormal radiotracer localization to indicate
a GI bleeding source. Expected physiologic uptake is noted.
IMPRESSION: No evidence of a GI bleeding source.

## 2018-11-06 MED ORDER — INSULIN ASPART 100 UNIT/ML ~~LOC~~ SOLN
0.0000 [IU] | Freq: Three times a day (TID) | SUBCUTANEOUS | Status: DC
  Administered 2018-11-06: 1 [IU] via SUBCUTANEOUS
  Administered 2018-11-06 (×2): 2 [IU] via SUBCUTANEOUS
  Administered 2018-11-07: 3 [IU] via SUBCUTANEOUS
  Administered 2018-11-07 (×2): 1 [IU] via SUBCUTANEOUS
  Administered 2018-11-07 – 2018-11-08 (×2): 2 [IU] via SUBCUTANEOUS
  Administered 2018-11-08: 5 [IU] via SUBCUTANEOUS

## 2018-11-06 MED ORDER — TECHNETIUM TC 99M-LABELED RED BLOOD CELLS IV KIT
25.0000 | PACK | Freq: Once | INTRAVENOUS | Status: AC | PRN
  Administered 2018-11-06: 25 via INTRAVENOUS

## 2018-11-06 NOTE — Progress Notes (Signed)
Family Medicine Teaching Service Daily Progress Note Intern Pager: [phone]  Patient name: Blake Vaughn Medical record number: [id number] Date of birth: 1961-09-29 Age: 57 y.o. Gender: male  Primary Care Provider: [name], [name], DO Consultants: GI Code Status: FULL  Pt Overview and Major Events to Date:  11/28 admitted with BRBPR in the setting of recent colectomy 11/29 EGD without source, transfused 2U PRBC  Assessment and Plan: Blake Vaughn is a 57 y.o. 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  BRBPR s/pR Hemicolectomy, worsening:  no source on EGD, recent colectomy 11/14, per GI discussion with surgery, less likely from anastomosis, although this needs to be considered with negative EGD and no diverticulitis on last CT. CT read states concern for small soft tissue density reflecting hematoma vs collection. Hgb stable at 8.6 on 11/06/18. Notes some small blood in BM at 0400 11/06/18.  -daily CBC -mIVF 110ml/hr x 12 hrs, end at noon  -Out of bed with assistance, fall risk -clear liquid diet -GI consult: tagged RBC scan if continued bleeding  Hyperkalemia: resolved.  -daily BMP  Syncopal episode, likely orthostatic/vasovagal:  Patient's hypovolemia and anxiety are likely contributing.  No other syncopal events have been recorded since patient was in the ED. -Continue maintenance IV fluids  -Continuous cardiac monitoring -Out of bed with assistance, fall risk  Diabetes type 2: Patient takes glipizide and metformin at home. Most recent glucose was 153. -Hold home oral medications, will hold while inpatient -sSSI Q4H while NPO  Hypertension:  home metoprolol 12.5mg  BId.  -restart metoprolol if HTN or elevated HR  HLD: on atorvastatin at home. -Hold atorvastatin while patient n.p.o.  Recent  appendectomy complicated by cecal mass,s/pright colectomy:  Patient was hospitalized for recent appendectomy on 10/21/2018.A pedicular mass was discovered during laparoscopy.Per surgical notes, patient required right hemicolectomy as bowel showed signs of ischemias/p removal of mass. Specimen sent to surgical pathology and has not returned. Patient was referred to oncology on discharge, but has yet to follow-up. It would benefit patient to have oncology follow-up scheduled prior to discharge to avoid patient being lost to follow-up. -Consult case management -Follow up heme/onc consult placed on discharge  History of follicular lymphoma, in remission:  Patient reports history of lymphoma and chemotherapy from2006-2012(or so, per patient). He followed with Dr. Julien Nordmann.Looks like referral was entered and approved at last admission, unclear why patient has not heard from oncology yet (per his report). Will make sure follow up with oncology in place prior to discharge.  FEN/GI: NPO, NS @ 100 ml/hr PPx: SCD  Disposition: continued inpatient management of bleeding  Subjective:  Patient says nothing is wrong other than his bleeding. Wants to know why he is on tele. Wants to shower. States small blood in BM at 0400, says someone told him this was ok today. Denies abd pain. Declines interpreter.   Objective: Temp:  [97.8 F (36.6 C)-98.8 F (37.1 C)] 98.8 F (37.1 C) (11/30 0413) Pulse Rate:  [79-93] 82 (11/30 0413) Resp:  [12-25] 19 (11/30 0413) BP: (129-162)/(59-78) 132/74 (11/30 0413) SpO2:  [95 %-100 %] 100 % (11/30 0413) Physical Exam: General: lying in bed asleep, easily aroused Cardiovascular: RRR, no murmur  Respiratory: CTAB, easy WOB Abdomen: SNTND, +bs, large vental incision with dressings CDI Extremities: no edema  Laboratory: Recent Labs  Lab 11/04/18 1903 11/05/18 0303 11/05/18 1259 11/05/18 2357 11/06/18 0400  WBC 8.8 9.2  --   --  7.9  HGB 9.6* 6.8* 8.3*  7.8* 8.6*  HCT 29.3* 21.0* 24.8* 24.1* 26.4*  PLT 415* 356  --   --  318   Recent Labs  Lab 11/04/18 1903 11/05/18 0303 11/05/18 1259 11/06/18 0400  NA 135 133*  --  138  K 5.2* 5.9* 4.0 3.8  CL 102 107  --  107  CO2 24 23  --  22  BUN 34* 32*  --  13  CREATININE 1.02 1.03  --  0.83  CALCIUM 8.6* 7.7*  --  8.5*  PROT  --  4.3*  --   --   BILITOT  --  0.3  --   --   ALKPHOS  --  49  --   --   ALT  --  16  --   --   AST  --  14*  --   --   GLUCOSE 255* 200*  --  137*     Imaging/Diagnostic Tests: No results found.   Sela Hilding, MD 11/06/2018, 8:03 AM PGY-3, [place] Intern pager: [phone], text pages welcome

## 2018-11-06 NOTE — Progress Notes (Signed)
[facility] Gastroenterology Progress Note  Blake Vaughn 57 y.o. 1961-08-08  CC:  GI bleed   Subjective: - had 1 episode of bleeding this morning. Denies abdominal pain, nausea vomiting. He is requesting further workup for his bleeding.  ROS : negative for chest pain and shortness of breath   Objective: Vital signs in last 24 hours: Vitals:   11/05/18 2142 11/06/18 0413  BP: 129/66 132/74  Pulse: 79 82  Resp: 20 19  Temp: 98.4 F (36.9 C) 98.8 F (37.1 C)  SpO2: 100% 100%    Physical Exam:  General:  Alert, cooperative, no distress, appears stated age  Head:  Normocephalic, without obvious abnormality, atraumatic  Eyes:  , EOM's intact,   Lungs:   Clear to auscultation bilaterally, respirations unlabored  Heart:  Regular rate and rhythm, S1, S2 normal  Abdomen:   Soft, non-tender, nondistended, bowel sounds present. No peritoneal signs.  Extremities: Extremities normal, atraumatic, no  edema  Pulses: 2+ and symmetric    Lab Results: Recent Labs    11/05/18 0303 11/05/18 1259 11/06/18 0400  NA 133*  --  138  K 5.9* 4.0 3.8  CL 107  --  107  CO2 23  --  22  GLUCOSE 200*  --  137*  BUN 32*  --  13  CREATININE 1.03  --  0.83  CALCIUM 7.7*  --  8.5*  MG  --  1.9  --    Recent Labs    11/05/18 0303  AST 14*  ALT 16  ALKPHOS 49  BILITOT 0.3  PROT 4.3*  ALBUMIN 2.5*   Recent Labs    11/04/18 1903 11/05/18 0303  11/05/18 2357 11/06/18 0400  WBC 8.8 9.2  --   --  7.9  NEUTROABS 5.4 6.6  --   --   --   HGB 9.6* 6.8*   < > 7.8* 8.6*  HCT 29.3* 21.0*   < > 24.1* 26.4*  MCV 88.3 89.0  --   --  86.0  PLT 415* 356  --   --  318   < > = values in this interval not displayed.   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   - acute blood loss anemia. Hemoglobin stable now  Recommendations ----------------------- - patient's hemoglobin is stable. His bleeding has slowed down and probably resolved.  We discussed that probability of finding a source of bleeding would be less with bleeding scan as his bleeding has slowed down but he is requesting further workup. CT scan 2 days ago did showed possible small hematoma or operator collection at the surgical site in the right lower quadrant. - I will order a GI bleeding scan. - he has been referred to oncology. - he will need colonoscopy at some point ,which can be done as an outpatient. - GI will follow.   Otis Brace MD, [place] 11/06/2018, 12:43 PM  Contact #  [phone]

## 2018-11-06 NOTE — Progress Notes (Signed)
Patient ID: Blake Vaughn, male   DOB: 1961-07-22, 57 y.o.   MRN: [id number] [facility] Progress Note:   1 Day Post-Op  Subjective: Mental status is clear;  He says he had smaller amt of blood in BM this am. Objective: Vital signs in last 24 hours: Temp:  [97.8 F (36.6 C)-98.8 F (37.1 C)] 98.8 F (37.1 C) (11/30 0413) Pulse Rate:  [79-90] 82 (11/30 0413) Resp:  [12-25] 19 (11/30 0413) BP: (129-162)/(59-78) 132/74 (11/30 0413) SpO2:  [95 %-100 %] 100 % (11/30 0413)  Intake/Output from previous day: 11/29 0701 - 11/30 0700 In: 1020 [I.V.:400; Blood:620] Out: 1401 [Urine:1400; Blood:1] Intake/Output this shift: Total I/O In: 240 [P.O.:240] Out: 450 [Urine:450]  Physical Exam: Work of breathing is OK.  No abdominal pain  Lab Results:  Results for orders placed or performed during the hospital encounter of 11/04/18 (from the past 48 hour(s))  Basic metabolic panel     Status: Abnormal   Collection Time: 11/04/18  7:03 PM  Result Value Ref Range   Sodium 135 135 - 145 mmol/L   Potassium 5.2 (H) 3.5 - 5.1 mmol/L   Chloride 102 98 - 111 mmol/L   CO2 24 22 - 32 mmol/L   Glucose, Bld 255 (H) 70 - 99 mg/dL   BUN 34 (H) 6 - 20 mg/dL   Creatinine, Ser 1.02 0.61 - 1.24 mg/dL   Calcium 8.6 (L) 8.9 - 10.3 mg/dL   GFR calc non Af Amer >60 >60 mL/min   GFR calc Af Amer >60 >60 mL/min   Anion gap 9 5 - 15    Comment: Performed at [facility], [place] [address] [place], [place] [address]  CBC with Differential     Status: Abnormal   Collection Time: 11/04/18  7:03 PM  Result Value Ref Range   WBC 8.8 4.0 - 10.5 K/uL   RBC 3.32 (L) 4.22 - 5.81 MIL/uL   Hemoglobin 9.6 (L) 13.0 - 17.0 g/dL   HCT 29.3 (L) 39.0 - 52.0 %   MCV 88.3 80.0 - 100.0 fL   MCH 28.9 26.0 - 34.0 pg   MCHC 32.8 30.0 - 36.0 g/dL   RDW 11.7 11.5 - 15.5 %   Platelets 415 (H) 150 - 400 K/uL   nRBC 0.0 0.0 - 0.2 %   Neutrophils Relative % 60 %   Neutro Abs 5.4 1.7 - 7.7 K/uL   Lymphocytes  Relative 26 %   Lymphs Abs 2.3 0.7 - 4.0 K/uL   Monocytes Relative 9 %   Monocytes Absolute 0.8 0.1 - 1.0 K/uL   Eosinophils Relative 4 %   Eosinophils Absolute 0.3 0.0 - 0.5 K/uL   Basophils Relative 0 %   Basophils Absolute 0.0 0.0 - 0.1 K/uL   Immature Granulocytes 1 %   Abs Immature Granulocytes 0.04 0.00 - 0.07 K/uL    Comment: Performed at [facility], [address], [place], [place] [address]  Type and screen     Status: None (Preliminary result)   Collection Time: 11/04/18  7:16 PM  Result Value Ref Range   ABO/RH(D) O POS    Antibody Screen NEG    Sample Expiration 11/07/2018    Unit Number [id number]    Blood Component Type RED CELLS,LR    Unit division 00    Status of Unit ALLOCATED    Transfusion Status OK TO TRANSFUSE    Crossmatch Result Compatible    Unit Number [id number]  Blood Component Type RED CELLS,LR    Unit division 00    Status of Unit ALLOCATED    Transfusion Status OK TO TRANSFUSE    Crossmatch Result Compatible    Unit Number [id number]    Blood Component Type RED CELLS,LR    Unit division 00    Status of Unit ISSUED,FINAL    Transfusion Status OK TO TRANSFUSE    Crossmatch Result Compatible    Unit Number [id number]    Blood Component Type RED CELLS,LR    Unit division 00    Status of Unit ISSUED,FINAL    Transfusion Status OK TO TRANSFUSE    Crossmatch Result      Compatible Performed at [facility], [place] [address], [place], [place] [address]   ABO/Rh     Status: None   Collection Time: 11/04/18  7:16 PM  Result Value Ref Range   ABO/RH(D)      O POS Performed at [facility] [address], [place], [place] [address]   CBG monitoring, ED     Status: Abnormal   Collection Time: 11/04/18 10:24 PM  Result Value Ref Range   Glucose-Capillary 201 (H) 70 - 99 mg/dL  HIV antibody (Routine Testing)     Status: None   Collection Time: 11/05/18  3:03 AM  Result Value Ref Range   HIV Screen 4th Generation  wRfx Non Reactive Non Reactive    Comment: (NOTE) Performed At: [facility] [place], [place] [id number] Rush Farmer MD PT:[id number]   Comprehensive metabolic panel     Status: Abnormal   Collection Time: 11/05/18  3:03 AM  Result Value Ref Range   Sodium 133 (L) 135 - 145 mmol/L   Potassium 5.9 (H) 3.5 - 5.1 mmol/L   Chloride 107 98 - 111 mmol/L   CO2 23 22 - 32 mmol/L   Glucose, Bld 200 (H) 70 - 99 mg/dL   BUN 32 (H) 6 - 20 mg/dL   Creatinine, Ser 1.03 0.61 - 1.24 mg/dL   Calcium 7.7 (L) 8.9 - 10.3 mg/dL   Total Protein 4.3 (L) 6.5 - 8.1 g/dL   Albumin 2.5 (L) 3.5 - 5.0 g/dL   AST 14 (L) 15 - 41 U/L   ALT 16 0 - 44 U/L   Alkaline Phosphatase 49 38 - 126 U/L   Total Bilirubin 0.3 0.3 - 1.2 mg/dL   GFR calc non Af Amer >60 >60 mL/min   GFR calc Af Amer >60 >60 mL/min   Anion gap 3 (L) 5 - 15    Comment: Performed at [facility], [place] [address], [place], [place] [address]  CBC WITH DIFFERENTIAL     Status: Abnormal   Collection Time: 11/05/18  3:03 AM  Result Value Ref Range   WBC 9.2 4.0 - 10.5 K/uL   RBC 2.36 (L) 4.22 - 5.81 MIL/uL   Hemoglobin 6.8 (LL) 13.0 - 17.0 g/dL    Comment: REPEATED TO VERIFY THIS CRITICAL RESULT HAS VERIFIED AND BEEN CALLED TO B.GRACE,RN BY GEOFFREY MCADOO ON 11 29 2019 AT 0328, AND HAS BEEN READ BACK.     HCT 21.0 (L) 39.0 - 52.0 %   MCV 89.0 80.0 - 100.0 fL   MCH 28.8 26.0 - 34.0 pg   MCHC 32.4 30.0 - 36.0 g/dL   RDW 11.9 11.5 - 15.5 %   Platelets 356 150 - 400 K/uL   nRBC 0.0 0.0 - 0.2 %  Neutrophils Relative % 71 %   Neutro Abs 6.6 1.7 - 7.7 K/uL   Lymphocytes Relative 21 %   Lymphs Abs 1.9 0.7 - 4.0 K/uL   Monocytes Relative 6 %   Monocytes Absolute 0.5 0.1 - 1.0 K/uL   Eosinophils Relative 1 %   Eosinophils Absolute 0.1 0.0 - 0.5 K/uL   Basophils Relative 0 %   Basophils Absolute 0.0 0.0 - 0.1 K/uL   Immature Granulocytes 1 %   Abs Immature Granulocytes 0.06 0.00 - 0.07 K/uL    Comment:  Performed at [facility] [address], [place], [place] [address]  Protime-INR     Status: None   Collection Time: 11/05/18  3:03 AM  Result Value Ref Range   Prothrombin Time 13.9 11.4 - 15.2 seconds   INR 1.08     Comment: Performed at [facility] [address], [place], [place] [address]  APTT     Status: None   Collection Time: 11/05/18  3:03 AM  Result Value Ref Range   aPTT 27 24 - 36 seconds    Comment: Performed at [facility] [address] [place], [place] [address]  Prepare RBC     Status: None   Collection Time: 11/05/18  4:02 AM  Result Value Ref Range   Order Confirmation      ORDER PROCESSED BY BLOOD BANK Performed at [facility], [place] [address] [place], [place] [address]   Glucose, capillary     Status: Abnormal   Collection Time: 11/05/18  7:59 AM  Result Value Ref Range   Glucose-Capillary 153 (H) 70 - 99 mg/dL  Glucose, capillary     Status: Abnormal   Collection Time: 11/05/18 12:04 PM  Result Value Ref Range   Glucose-Capillary 139 (H) 70 - 99 mg/dL  Prealbumin     Status: None   Collection Time: 11/05/18 12:59 PM  Result Value Ref Range   Prealbumin 25.5 18 - 38 mg/dL    Comment: Performed at [facility], [place] [address] [place], [place] [address]  Hemoglobin and hematocrit, blood     Status: Abnormal   Collection Time: 11/05/18 12:59 PM  Result Value Ref Range   Hemoglobin 8.3 (L) 13.0 - 17.0 g/dL   HCT 24.8 (L) 39.0 - 52.0 %    Comment: Performed at [facility] [address], [place], [place] [address]  Potassium     Status: None   Collection Time: 11/05/18 12:59 PM  Result Value Ref Range   Potassium 4.0 3.5 - 5.1 mmol/L    Comment: Performed at [facility], [place] [address], [place], [place] [address]  Magnesium     Status: None   Collection Time: 11/05/18 12:59 PM  Result Value Ref Range   Magnesium 1.9 1.7 - 2.4 mg/dL    Comment: Performed at [facility], [place] [address],  [place], [place] [address]  Glucose, capillary     Status: Abnormal   Collection Time: 11/05/18  6:27 PM  Result Value Ref Range   Glucose-Capillary 124 (H) 70 - 99 mg/dL  Glucose, capillary     Status: Abnormal   Collection Time: 11/05/18  8:54 PM  Result Value Ref Range   Glucose-Capillary 188 (H) 70 - 99 mg/dL  Hemoglobin and hematocrit, blood     Status: Abnormal   Collection Time: 11/05/18 11:57 PM  Result Value Ref Range   Hemoglobin 7.8 (  L) 13.0 - 17.0 g/dL   HCT 24.1 (L) 39.0 - 52.0 %    Comment: Performed at [facility], [place] [address], [place], [place] [address]  Basic metabolic panel     Status: Abnormal   Collection Time: 11/06/18  4:00 AM  Result Value Ref Range   Sodium 138 135 - 145 mmol/L   Potassium 3.8 3.5 - 5.1 mmol/L   Chloride 107 98 - 111 mmol/L   CO2 22 22 - 32 mmol/L   Glucose, Bld 137 (H) 70 - 99 mg/dL   BUN 13 6 - 20 mg/dL   Creatinine, Ser 0.83 0.61 - 1.24 mg/dL   Calcium 8.5 (L) 8.9 - 10.3 mg/dL   GFR calc non Af Amer >60 >60 mL/min   GFR calc Af Amer >60 >60 mL/min   Anion gap 9 5 - 15    Comment: Performed at [facility], [place] [address] [place], [place] [address]  CBC     Status: Abnormal   Collection Time: 11/06/18  4:00 AM  Result Value Ref Range   WBC 7.9 4.0 - 10.5 K/uL   RBC 3.07 (L) 4.22 - 5.81 MIL/uL   Hemoglobin 8.6 (L) 13.0 - 17.0 g/dL   HCT 26.4 (L) 39.0 - 52.0 %   MCV 86.0 80.0 - 100.0 fL   MCH 28.0 26.0 - 34.0 pg   MCHC 32.6 30.0 - 36.0 g/dL   RDW 13.2 11.5 - 15.5 %   Platelets 318 150 - 400 K/uL   nRBC 0.0 0.0 - 0.2 %    Comment: Performed at [facility], [place] [address] [place], [place] [address]  Glucose, capillary     Status: Abnormal   Collection Time: 11/06/18  5:43 AM  Result Value Ref Range   Glucose-Capillary 139 (H) 70 - 99 mg/dL  Glucose, capillary     Status: Abnormal   Collection Time: 11/06/18  8:07 AM  Result Value Ref Range   Glucose-Capillary 128 (H) 70 - 99 mg/dL    Radiology/Results: Ct  Abdomen Pelvis Wo Contrast  Result Date: 11/04/2018 CLINICAL DATA:  Abdomen pain, rectal bleeding EXAM: CT ABDOMEN AND PELVIS WITHOUT CONTRAST TECHNIQUE: Multidetector CT imaging of the abdomen and pelvis was performed following the standard protocol without IV contrast. COMPARISON:  CT 10/21/2018, 09/09/2016 FINDINGS: Lower chest: Lung bases demonstrate no acute consolidation or effusion. Heart size is normal. Calcified granuloma in the right middle lobe. Hepatobiliary: No focal liver abnormality is seen. No gallstones, gallbladder wall thickening, or biliary dilatation. Pancreas: Unremarkable. No pancreatic ductal dilatation or surrounding inflammatory changes. Spleen: Normal in size without focal abnormality. Adrenals/Urinary Tract: Adrenal glands are unremarkable. Kidneys are normal, without renal calculi, focal lesion, or hydronephrosis. Bladder is unremarkable. Stomach/Bowel: Stomach is nonenlarged. No dilated small bowel. Diffuse fluid within the colon without wall thickening. Interval appendectomy. Residual soft tissue thickening and inflammatory changes in the right lower quadrant adjacent to the cecum. Vascular/Lymphatic: Mild aortic atherosclerosis. No aneurysm. Small retroperitoneal nodes Reproductive: Prostate is unremarkable. Other: No free air Musculoskeletal: No acute or significant osseous findings. IMPRESSION: 1. Interval appendectomy. There is residual soft tissue thickening and stranding in the right lower quadrant adjacent to the cecum which may represent ongoing inflammation versus postsurgical changes. A small soft tissue density adjacent to the surgical sutures may reflect small hematoma or operative collection. No large focal fluid collection to suggest drainable abscess allowing for absence of contrast 2. Fluid-filled colon without wall thickening, could reflect diarrheal  process Electronically Signed   By: Donavan Foil M.D.   On: 11/04/2018 20:40    Anti-infectives: Anti-infectives  (From admission, onward)   None      Assessment/Plan: Problem List: Patient Active Problem List   Diagnosis Date Noted  . Hyponatremia 11/05/2018  . Syncope due to orthostatic hypotension 11/05/2018  . Acute blood loss anemia 11/05/2018  . Hyperkalemia 11/05/2018  . High blood urea nitrogen (BUN) 11/05/2018  . Hypoalbuminemia 11/05/2018  . Hypoproteinemia ([place]) 11/05/2018  . GI bleed 11/05/2018  . Acute GI bleeding 11/04/2018  . Uses Spanish as primary spoken language 10/23/2018  . Obesity (BMI 30-39.9) 10/23/2018  . Mass of cecum s/p right colectomy 10/21/2018 10/22/2018  . Nocturnal leg cramps 07/16/2017  . HTN (hypertension) 11/27/2011  . Pure hypercholesterolemia 09/24/2009  . 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 Diabetes type 2, uncontrolled ([place]) 07/09/2009  . 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 Follicular lymphoma ([place]), 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    Would consider observation or red cell scan.  Hopefully bleeding will abate.  Will follow. 1 Day Post-Op    LOS: 1 day   Matt B. Hassell Done, MD, [facility] [phone] beeper [phone]  11/06/2018 10:28 AM

## 2018-11-07 ENCOUNTER — Encounter (HOSPITAL_COMMUNITY): Payer: Self-pay | Admitting: Gastroenterology

## 2018-11-07 LAB — BASIC METABOLIC PANEL
Anion gap: 8 (ref 5–15)
BUN: 5 mg/dL — AB (ref 6–20)
CO2: 23 mmol/L (ref 22–32)
Calcium: 8.1 mg/dL — ABNORMAL LOW (ref 8.9–10.3)
Chloride: 107 mmol/L (ref 98–111)
Creatinine, Ser: 0.65 mg/dL (ref 0.61–1.24)
GFR calc Af Amer: >60 mL/min (ref >60)
GFR calc non Af Amer: >60 mL/min (ref >60)
Glucose, Bld: 142 mg/dL — ABNORMAL HIGH (ref 70–99)
Potassium: 3.3 mmol/L — ABNORMAL LOW (ref 3.5–5.1)
Sodium: 138 mmol/L (ref 135–145)

## 2018-11-07 LAB — CBC
HCT: 24.2 % — ABNORMAL LOW (ref 39.0–52.0)
Hemoglobin: 7.8 g/dL — ABNORMAL LOW (ref 13.0–17.0)
MCH: 27.7 pg (ref 26.0–34.0)
MCHC: 32.2 g/dL (ref 30.0–36.0)
MCV: 85.8 fL (ref 80.0–100.0)
Platelets: 327 10*3/uL (ref 150–400)
RBC: 2.82 MIL/uL — ABNORMAL LOW (ref 4.22–5.81)
RDW: 12.8 % (ref 11.5–15.5)
WBC: 5.6 10*3/uL (ref 4.0–10.5)
nRBC: 0 % (ref 0.0–0.2)

## 2018-11-07 LAB — GLUCOSE, CAPILLARY
Glucose-Capillary: 135 mg/dL — ABNORMAL HIGH (ref 70–99)
Glucose-Capillary: 248 mg/dL — ABNORMAL HIGH (ref 70–99)
Glucose-Capillary: 186 mg/dL — ABNORMAL HIGH (ref 70–99)
Glucose-Capillary: 181 mg/dL — ABNORMAL HIGH (ref 70–99)

## 2018-11-07 MED ORDER — ATORVASTATIN CALCIUM 10 MG PO TABS
10.0000 mg | ORAL_TABLET | Freq: Every day | ORAL | Status: DC
  Administered 2018-11-07: 10 mg via ORAL
  Filled 2018-11-07: qty 1

## 2018-11-07 MED ORDER — POTASSIUM CHLORIDE CRYS ER 20 MEQ PO TBCR
20.0000 meq | EXTENDED_RELEASE_TABLET | Freq: Two times a day (BID) | ORAL | Status: DC
  Administered 2018-11-07 (×2): 20 meq via ORAL
  Filled 2018-11-07 (×2): qty 1

## 2018-11-07 NOTE — Progress Notes (Signed)
Patient ID: Blake Vaughn, male   DOB: 1961-02-04, 57 y.o.   MRN: [id number] [facility] Surgery Progress Note:   2 Days Post-Op  Subjective: Mental status is clear.  Patient examined on the toilet and having stool with some fresh blood.   Objective: Vital signs in last 24 hours: Temp:  [97.5 F (36.4 C)-98.8 F (37.1 C)] 98.6 F (37 C) (12/01 0816) Pulse Rate:  [75-81] 81 (12/01 0816) Resp:  [16-18] 18 (12/01 0816) BP: (123-153)/(67-77) 153/77 (12/01 0816) SpO2:  [97 %-100 %] 97 % (12/01 0816)  Intake/Output from previous day: 11/30 0701 - 12/01 0700 In: 3147.4 [P.O.:700; I.V.:2447.4] Out: 1925 [[id number]] Intake/Output this shift: No intake/output data recorded.  Physical Exam: Work of breathing is normal.  Incision OK  Lab Results:  Results for orders placed or performed during the hospital encounter of 11/04/18 (from the past 48 hour(s))  Glucose, capillary     Status: Abnormal   Collection Time: 11/05/18 12:04 PM  Result Value Ref Range   Glucose-Capillary 139 (H) 70 - 99 mg/dL  Prealbumin     Status: None   Collection Time: 11/05/18 12:59 PM  Result Value Ref Range   Prealbumin 25.5 18 - 38 mg/dL    Comment: Performed at [facility], [address], [place], [place] [address]  Hemoglobin and hematocrit, blood     Status: Abnormal   Collection Time: 11/05/18 12:59 PM  Result Value Ref Range   Hemoglobin 8.3 (L) 13.0 - 17.0 g/dL   HCT 24.8 (L) 39.0 - 52.0 %    Comment: Performed at [facility] [address], [place], [place] [address]  Potassium     Status: None   Collection Time: 11/05/18 12:59 PM  Result Value Ref Range   Potassium 4.0 3.5 - 5.1 mmol/L    Comment: Performed at [facility], [place] [address], [place], [place] [address]  Magnesium     Status: None   Collection Time: 11/05/18 12:59 PM  Result Value Ref Range   Magnesium 1.9 1.7 - 2.4 mg/dL    Comment: Performed at [facility], [place] [address], [place], [place]  [address]  Glucose, capillary     Status: Abnormal   Collection Time: 11/05/18  6:27 PM  Result Value Ref Range   Glucose-Capillary 124 (H) 70 - 99 mg/dL  Glucose, capillary     Status: Abnormal   Collection Time: 11/05/18  8:54 PM  Result Value Ref Range   Glucose-Capillary 188 (H) 70 - 99 mg/dL  Hemoglobin and hematocrit, blood     Status: Abnormal   Collection Time: 11/05/18 11:57 PM  Result Value Ref Range   Hemoglobin 7.8 (L) 13.0 - 17.0 g/dL   HCT 24.1 (L) 39.0 - 52.0 %    Comment: Performed at [facility] [address] [place], [place] [address]  Basic metabolic panel     Status: Abnormal   Collection Time: 11/06/18  4:00 AM  Result Value Ref Range   Sodium 138 135 - 145 mmol/L   Potassium 3.8 3.5 - 5.1 mmol/L   Chloride 107 98 - 111 mmol/L   CO2 22 22 - 32 mmol/L   Glucose, Bld 137 (H) 70 - 99 mg/dL   BUN 13 6 - 20 mg/dL   Creatinine, Ser 0.83 0.61 - 1.24 mg/dL   Calcium 8.5 (L) 8.9 - 10.3 mg/dL   GFR calc non Af Amer >60 >60 mL/min   GFR  calc Af Amer >60 >60 mL/min   Anion gap 9 5 - 15    Comment: Performed at [facility] [address], [place], [place] [address]  CBC     Status: Abnormal   Collection Time: 11/06/18  4:00 AM  Result Value Ref Range   WBC 7.9 4.0 - 10.5 K/uL   RBC 3.07 (L) 4.22 - 5.81 MIL/uL   Hemoglobin 8.6 (L) 13.0 - 17.0 g/dL   HCT 26.4 (L) 39.0 - 52.0 %   MCV 86.0 80.0 - 100.0 fL   MCH 28.0 26.0 - 34.0 pg   MCHC 32.6 30.0 - 36.0 g/dL   RDW 13.2 11.5 - 15.5 %   Platelets 318 150 - 400 K/uL   nRBC 0.0 0.0 - 0.2 %    Comment: Performed at [facility], [place] [address] [place], [place] [address]  Glucose, capillary     Status: Abnormal   Collection Time: 11/06/18  5:43 AM  Result Value Ref Range   Glucose-Capillary 139 (H) 70 - 99 mg/dL  Glucose, capillary     Status: Abnormal   Collection Time: 11/06/18  8:07 AM  Result Value Ref Range   Glucose-Capillary 128 (H) 70 - 99 mg/dL  Glucose, capillary     Status: Abnormal    Collection Time: 11/06/18 11:47 AM  Result Value Ref Range   Glucose-Capillary 191 (H) 70 - 99 mg/dL  Glucose, capillary     Status: Abnormal   Collection Time: 11/06/18  5:44 PM  Result Value Ref Range   Glucose-Capillary 145 (H) 70 - 99 mg/dL  CBC     Status: Abnormal   Collection Time: 11/06/18  6:53 PM  Result Value Ref Range   WBC 6.4 4.0 - 10.5 K/uL   RBC 2.93 (L) 4.22 - 5.81 MIL/uL   Hemoglobin 8.4 (L) 13.0 - 17.0 g/dL   HCT 24.9 (L) 39.0 - 52.0 %   MCV 85.0 80.0 - 100.0 fL   MCH 28.7 26.0 - 34.0 pg   MCHC 33.7 30.0 - 36.0 g/dL   RDW 12.9 11.5 - 15.5 %   Platelets 313 150 - 400 K/uL   nRBC 0.0 0.0 - 0.2 %    Comment: Performed at [facility], [place]. [address] [place], [place] [address]  Glucose, capillary     Status: Abnormal   Collection Time: 11/06/18  9:01 PM  Result Value Ref Range   Glucose-Capillary 166 (H) 70 - 99 mg/dL   Comment 1 Notify RN    Comment 2 Document in Chart   CBC     Status: Abnormal   Collection Time: 11/07/18  4:48 AM  Result Value Ref Range   WBC 5.6 4.0 - 10.5 K/uL   RBC 2.82 (L) 4.22 - 5.81 MIL/uL   Hemoglobin 7.8 (L) 13.0 - 17.0 g/dL   HCT 24.2 (L) 39.0 - 52.0 %   MCV 85.8 80.0 - 100.0 fL   MCH 27.7 26.0 - 34.0 pg   MCHC 32.2 30.0 - 36.0 g/dL   RDW 12.8 11.5 - 15.5 %   Platelets 327 150 - 400 K/uL   nRBC 0.0 0.0 - 0.2 %    Comment: Performed at [facility], [place] [address] [place], [place] [address]  Basic metabolic panel     Status: Abnormal   Collection Time: 11/07/18  4:48 AM  Result Value Ref Range   Sodium 138 135 - 145 mmol/L   Potassium 3.3 (L)  3.5 - 5.1 mmol/L   Chloride 107 98 - 111 mmol/L   CO2 23 22 - 32 mmol/L   Glucose, Bld 142 (H) 70 - 99 mg/dL   BUN 5 (L) 6 - 20 mg/dL   Creatinine, Ser 0.65 0.61 - 1.24 mg/dL   Calcium 8.1 (L) 8.9 - 10.3 mg/dL   GFR calc non Af Amer >60 >60 mL/min   GFR calc Af Amer >60 >60 mL/min   Anion gap 8 5 - 15    Comment: Performed at [facility] [address] [place], [place] [address]  Glucose, capillary     Status: Abnormal   Collection Time: 11/07/18  8:17 AM  Result Value Ref Range   Glucose-Capillary 135 (H) 70 - 99 mg/dL    Radiology/Results: Nm Gi Blood Loss  Result Date: 11/06/2018 CLINICAL DATA:  GI bleeding for 2 days.  Recent appendectomy. EXAM: NUCLEAR MEDICINE GASTROINTESTINAL BLEEDING SCAN TECHNIQUE: Sequential abdominal images were obtained following intravenous administration of Tc-38m labeled red blood cells. RADIOPHARMACEUTICALS:  22.0 mCi Tc-38m pertechnetate in-vitro labeled red cells. COMPARISON:  CT, 11/04/2018 FINDINGS: There are no areas of abnormal radiotracer localization to indicate a GI bleeding source. Expected physiologic uptake is noted. IMPRESSION: No evidence of a GI bleeding source. Electronically Signed   By: Lajean Manes M.D.   On: 11/06/2018 19:19    Anti-infectives: Anti-infectives (From admission, onward)   None      Assessment/Plan: Problem List: Patient Active Problem List   Diagnosis Date Noted  . Hyponatremia 11/05/2018  . Syncope due to orthostatic hypotension 11/05/2018  . Acute blood loss anemia 11/05/2018  . Hyperkalemia 11/05/2018  . High blood urea nitrogen (BUN) 11/05/2018  . Hypoalbuminemia 11/05/2018  . Hypoproteinemia ([place]) 11/05/2018  . GI bleed 11/05/2018  . Acute GI bleeding 11/04/2018  . Uses Spanish as primary spoken language 10/23/2018  . Obesity (BMI 30-39.9) 10/23/2018  . Mass of cecum s/p right colectomy 10/21/2018 10/22/2018  . Nocturnal leg cramps 07/16/2017  . HTN (hypertension) 11/27/2011  . Pure hypercholesterolemia 09/24/2009  . 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 Diabetes type 2, uncontrolled ([place]) 07/09/2009  . 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  I would suspect some occult NSAID after surgery that is contributing to this bleed.  Continue  observation.   2 Days Post-Op    LOS: 2 days   Matt B. Hassell Done, MD, [facility] [phone] beeper [phone]  11/07/2018 8:30 AM

## 2018-11-07 NOTE — Progress Notes (Signed)
[facility] Gastroenterology Progress Note  Blake Vaughn 57 y.o. 1961/01/28  CC:  GI bleed   Subjective: No bleeding episodes since yesterday. No bowel movement. Denies abdominal pain, nausea vomiting. Bleeding scan was negative.  ROS : negative for chest pain and shortness of breath   Objective: Vital signs in last 24 hours: Vitals:   11/06/18 2149 11/07/18 0435  BP: 130/70 133/67  Pulse: 79 75  Resp: 17 16  Temp: 98.8 F (37.1 C) 98.5 F (36.9 C)  SpO2: 99% 98%    Physical Exam:  General:  Alert, cooperative, no distress, appears stated age  Head:  Normocephalic, without obvious abnormality, atraumatic  Eyes:  , EOM's intact,   Lungs:   Clear to auscultation bilaterally, respirations unlabored  Heart:  Regular rate and rhythm, S1, S2 normal  Abdomen:   Soft, non-tender, nondistended, bowel sounds present. No peritoneal signs.  Extremities: Extremities normal, atraumatic, no  edema  Pulses: 2+ and symmetric    Lab Results: Recent Labs    11/05/18 1259 11/06/18 0400 11/07/18 0448  NA  --  138 138  K 4.0 3.8 3.3*  CL  --  107 107  CO2  --  22 23  GLUCOSE  --  137* 142*  BUN  --  13 5*  CREATININE  --  0.83 0.65  CALCIUM  --  8.5* 8.1*  MG 1.9  --   --    Recent Labs    11/05/18 0303  AST 14*  ALT 16  ALKPHOS 49  BILITOT 0.3  PROT 4.3*  ALBUMIN 2.5*   Recent Labs    11/04/18 1903 11/05/18 0303  11/06/18 1853 11/07/18 0448  WBC 8.8 9.2   < > 6.4 5.6  NEUTROABS 5.4 6.6  --   --   --   HGB 9.6* 6.8*   < > 8.4* 7.8*  HCT 29.3* 21.0*   < > 24.9* 24.2*  MCV 88.3 89.0   < > 85.0 85.8  PLT 415* 356   < > 313 327   < > = values in this interval not displayed.   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   - acute blood loss anemia. Hemoglobin stable now  Recommendations ----------------------- - bleeding scan negative. No further bleeding episodes. Mild drop in hemoglobin could be dilutional. - Advance diet to soft diet. - Okay to discharge from GI standpoint. - he has been referred to oncology. - recommend outpatient colonoscopy - recommend iron supplements for anemia. - Follow-up in GI clinic in 4 weeks. - GI will sign off. Call us back if needed   Otis Brace MD, [place] 11/07/2018, 8:14 AM  Contact #  [phone]

## 2018-11-07 NOTE — Progress Notes (Signed)
Offered patient influenza vaccine again today.  Patient declines.

## 2018-11-07 NOTE — Progress Notes (Signed)
Family Medicine Teaching Service Daily Progress Note Intern Pager: [phone]  Patient name: Blake Vaughn Medical record number: [id number] Date of birth: [birth year] Age: 57 y.o. Gender: male  Primary Care Provider: [name], [name], DO Consultants: GI Code Status: FULL  Pt Overview and Major Events to Date:  11/28 admitted with BRBPR in the setting of recent colectomy 11/29 EGD without source, transfused 2U PRBC  Assessment and Plan: Blake Vaughn is a 57 y.o. 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  BRBPR s/pR Hemicolectomy, worsening:  no source on EGD, recent colectomy 11/14, per GI discussion with surgery, less likely from anastomosis, although this needs to be considered with negative EGD and no diverticulitis on last CT. CT read states concern for small soft tissue density reflecting hematoma vs collection. Hgb stable at 8.6 on 11/07/18. Notes some small blood in BM at 0400 11/07/18.  -daily CBC -Out of bed with assistance, fall risk -advanced diet to soft solids -GI consult: tagged RBC scan negative  Hypokalemia: resolved.  -daily BMP -replete potassium with 43mEq BID of K-Dur  Syncopal episode, likely orthostatic/vasovagal:  Patient's hypovolemia and anxiety are likely contributing.  No other syncopal events have been recorded since patient was in the ED. -Continue maintenance IV fluids  -Continuous cardiac monitoring -Out of bed with assistance, fall risk  Diabetes type 2: Patient takes glipizide and metformin at home. Most recent glucose was 135. -Hold home oral medications while inpatient -sSSI Q4H  Hypertension:  home metoprolol 12.5mg  BID. One episode of elevated SBP this am 153/77 -restart metoprolol if HTN continues and/or elevated HR  HLD: on atorvastatin at home. -Restart  atorvastatin now tolerating soft solid diet  Recent appendectomy complicated by cecal mass,s/pright colectomy:  Patient was hospitalized for recent appendectomy on 10/21/2018.A pedicular mass was discovered during laparoscopy.Per surgical notes, patient required right hemicolectomy as bowel showed signs of ischemias/p removal of mass. Specimen sent to surgical pathology and has not returned. Patient was referred to oncology on discharge, but has yet to follow-up. It would benefit patient to have oncology follow-up scheduled prior to discharge to avoid patient being lost to follow-up. -Consult case management -Follow up heme/onc consult placed on discharge  History of follicular lymphoma, in remission:  Patient reports history of lymphoma and chemotherapy from2006-2012(or so, per patient). He followed with Dr. Julien Nordmann.Looks like referral was entered and approved at last admission, unclear why patient has not heard from oncology yet (per his report). Will make sure follow up with oncology in place prior to discharge.  FEN/GI: NPO, NS @ 100 ml/hr PPx: SCD  Disposition: continued inpatient management of bleeding  Subjective:  Patient still endorsing very little hematemesis and blood in stool. No other complaints this am.  Objective: Temp:  [97.5 F (36.4 C)-98.8 F (37.1 C)] 98.6 F (37 C) (12/01 0816) Pulse Rate:  [75-81] 81 (12/01 0816) Resp:  [16-18] 18 (12/01 0816) BP: (123-153)/(67-77) 153/77 (12/01 0816) SpO2:  [97 %-100 %] 97 % (12/01 0816) Physical Exam: General: lying in bed, awake alert, NAD Cardiovascular: RRR, no murmurs Respiratory: CTAB, NWOB Abdomen: non-distended, non-tender, soft, +bs Extremities: no edema  Laboratory: Recent Labs  Lab 11/06/18 0400 11/06/18 1853 11/07/18 0448  WBC 7.9 6.4 5.6  HGB 8.6* 8.4* 7.8*  HCT 26.4* 24.9* 24.2*  PLT 318 313 327   Recent Labs  Lab 11/05/18 0303 11/05/18 1259 11/06/18 0400 11/07/18  0448  NA 133*  --   138 138  K 5.9* 4.0 3.8 3.3*  CL 107  --  107 107  CO2 23  --  22 23  BUN 32*  --  13 5*  CREATININE 1.03  --  0.83 0.65  CALCIUM 7.7*  --  8.5* 8.1*  PROT 4.3*  --   --   --   BILITOT 0.3  --   --   --   ALKPHOS 49  --   --   --   ALT 16  --   --   --   AST 14*  --   --   --   GLUCOSE 200*  --  137* 142*     Imaging/Diagnostic Tests: Nm Gi Blood Loss  Result Date: 11/06/2018 CLINICAL DATA:  GI bleeding for 2 days.  Recent appendectomy. EXAM: NUCLEAR MEDICINE GASTROINTESTINAL BLEEDING SCAN TECHNIQUE: Sequential abdominal images were obtained following intravenous administration of Tc-18m labeled red blood cells. RADIOPHARMACEUTICALS:  22.0 mCi Tc-57m pertechnetate in-vitro labeled red cells. COMPARISON:  CT, 11/04/2018 FINDINGS: There are no areas of abnormal radiotracer localization to indicate a GI bleeding source. Expected physiologic uptake is noted. IMPRESSION: No evidence of a GI bleeding source. Electronically Signed   By: Lajean Manes M.D.   On: 11/06/2018 19:19     Nuala Alpha, DO 11/07/2018, 8:22 AM PGY-2, [place] Intern pager: [phone], text pages welcome

## 2018-11-08 ENCOUNTER — Inpatient Hospital Stay (HOSPITAL_COMMUNITY): Payer: Medicaid Other

## 2018-11-08 ENCOUNTER — Telehealth: Payer: Self-pay | Admitting: Hematology

## 2018-11-08 ENCOUNTER — Encounter (HOSPITAL_COMMUNITY): Payer: Self-pay | Admitting: Radiology

## 2018-11-08 LAB — BPAM RBC
Blood Product Expiration Date: 201912232359
Blood Product Expiration Date: 201912232359
Blood Product Expiration Date: 201912272359
Blood Product Expiration Date: 201912272359
ISSUE DATE / TIME: 201911290452
ISSUE DATE / TIME: 201911290745
Unit Type and Rh: 5100
Unit Type and Rh: 5100
Unit Type and Rh: 5100
Unit Type and Rh: 5100

## 2018-11-08 LAB — TYPE AND SCREEN
ABO/RH(D): O POS
ANTIBODY SCREEN: NEGATIVE
UNIT DIVISION: 0
UNIT DIVISION: 0
UNIT DIVISION: 0
Unit division: 0

## 2018-11-08 LAB — CBC
HCT: 24.9 % — ABNORMAL LOW (ref 39.0–52.0)
Hemoglobin: 8 g/dL — ABNORMAL LOW (ref 13.0–17.0)
MCH: 27.4 pg (ref 26.0–34.0)
MCHC: 32.1 g/dL (ref 30.0–36.0)
MCV: 85.3 fL (ref 80.0–100.0)
NRBC: 0.3 % — AB (ref 0.0–0.2)
Platelets: 329 10*3/uL (ref 150–400)
RBC: 2.92 MIL/uL — ABNORMAL LOW (ref 4.22–5.81)
RDW: 12.7 % (ref 11.5–15.5)
WBC: 6.6 10*3/uL (ref 4.0–10.5)

## 2018-11-08 LAB — BASIC METABOLIC PANEL
ANION GAP: 8 (ref 5–15)
BUN: 7 mg/dL (ref 6–20)
CO2: 24 mmol/L (ref 22–32)
Calcium: 8.7 mg/dL — ABNORMAL LOW (ref 8.9–10.3)
Chloride: 105 mmol/L (ref 98–111)
Creatinine, Ser: 0.76 mg/dL (ref 0.61–1.24)
GFR calc Af Amer: >60 mL/min (ref >60)
GFR calc non Af Amer: >60 mL/min (ref >60)
Glucose, Bld: 179 mg/dL — ABNORMAL HIGH (ref 70–99)
Potassium: 3.8 mmol/L (ref 3.5–5.1)
Sodium: 137 mmol/L (ref 135–145)

## 2018-11-08 LAB — GLUCOSE, CAPILLARY
Glucose-Capillary: 182 mg/dL — ABNORMAL HIGH (ref 70–99)
Glucose-Capillary: 300 mg/dL — ABNORMAL HIGH (ref 70–99)

## 2018-11-08 IMAGING — CT CT CHEST W/ CM
2 of 3 series · 15 of 36 positions shown, 18 images · IV contrast (omnipaque)
Comparison: CT chest [DATE].

CLINICAL DATA: 57-year-old male with history of hemoptysis and
rectal bleeding.

EXAM:
CT CHEST WITH CONTRAST
TECHNIQUE: Multidetector CT imaging of the chest was performed during
intravenous contrast administration.
CONTRAST:  75mL OMNIPAQUE IOHEXOL 300 MG/ML  SOLN

[Series 3: chest with 2mm st · axial · 0.78mm/px · z∈[+1083,+1313]mm · 12 of 136 slices shown, 15 images]
[im 11/136  mediastinal]
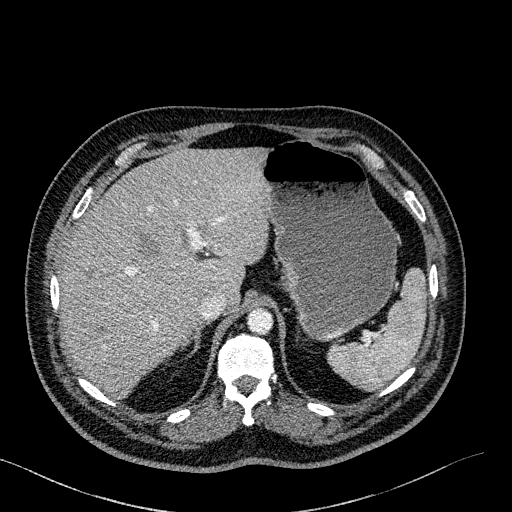
[im 11/136  lung]
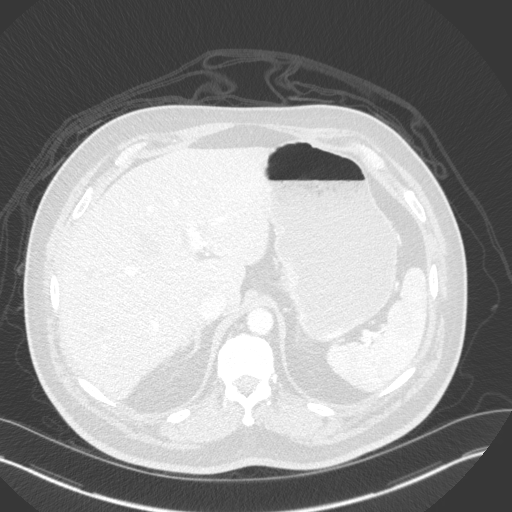
[im 21/136  lung]
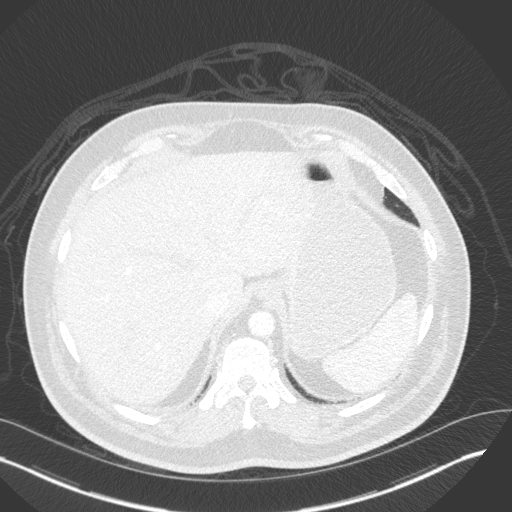
[im 31/136  lung]
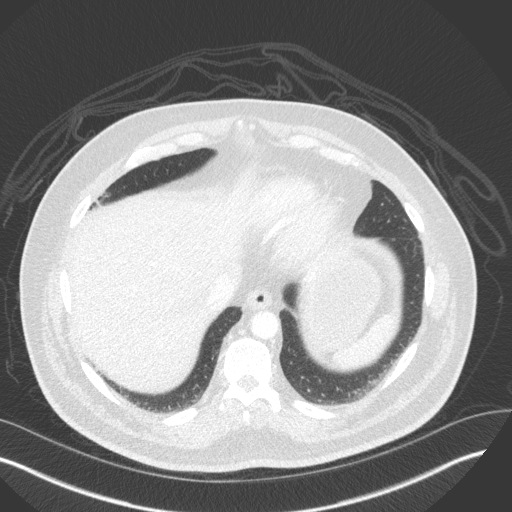
[im 41/136  lung]
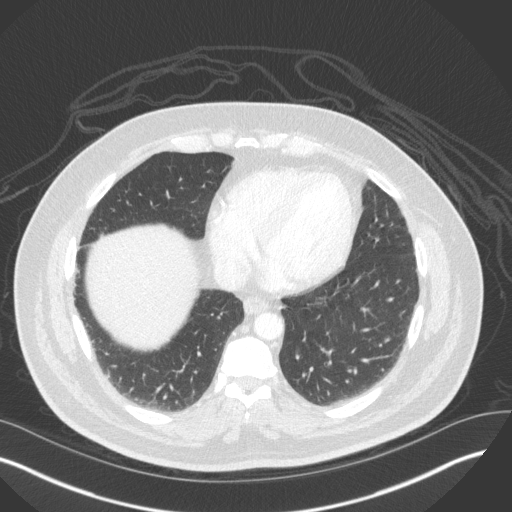
[im 51/136  mediastinal]
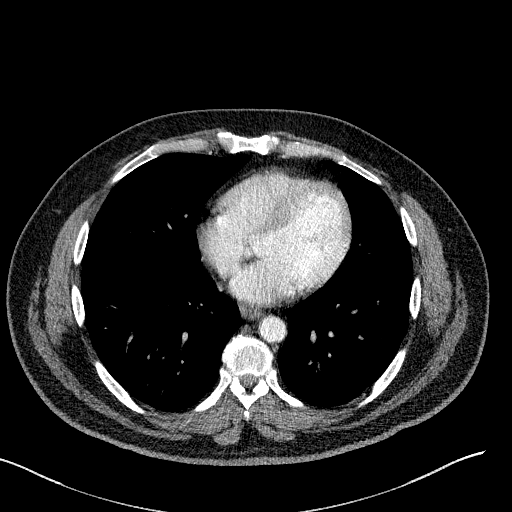
[im 51/136  lung]
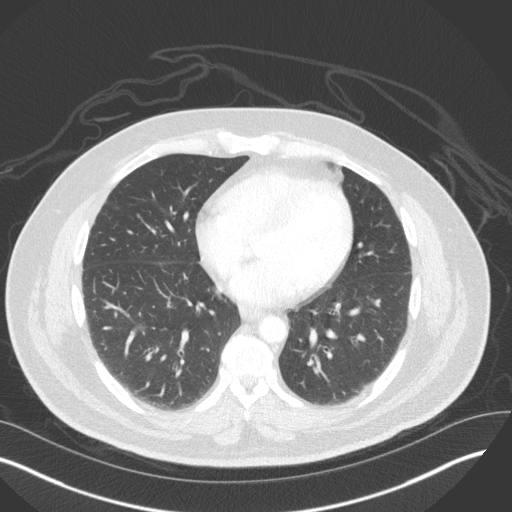
[im 61/136  lung]
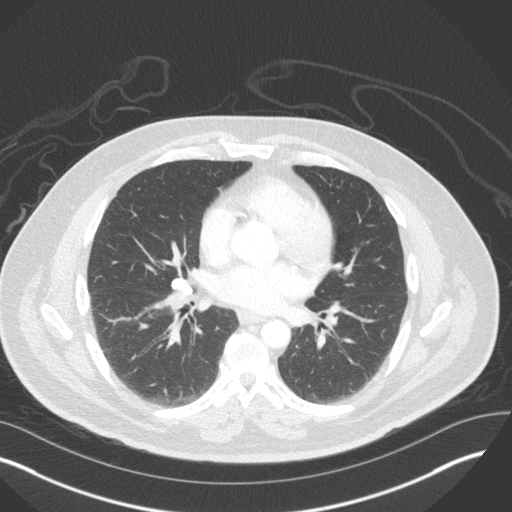
[im 76/136  lung]
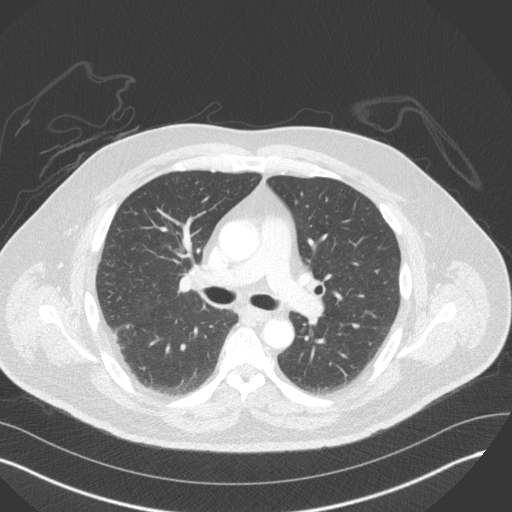
[im 86/136  lung]
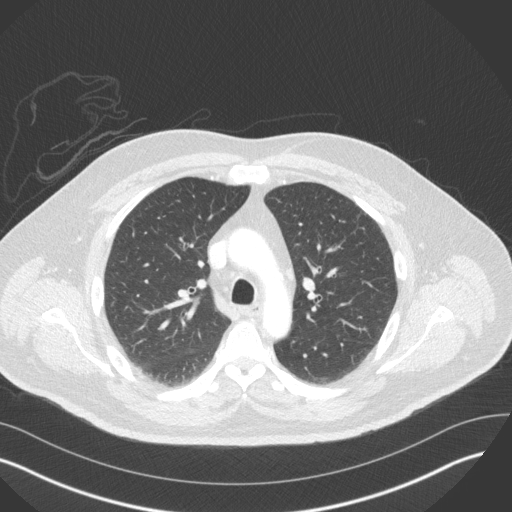
[im 96/136  mediastinal]
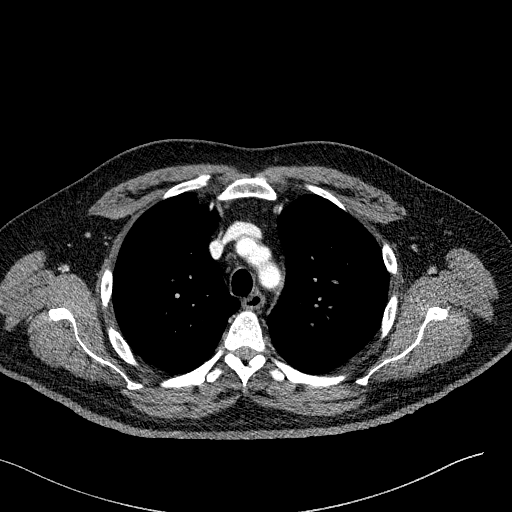
[im 96/136  lung]
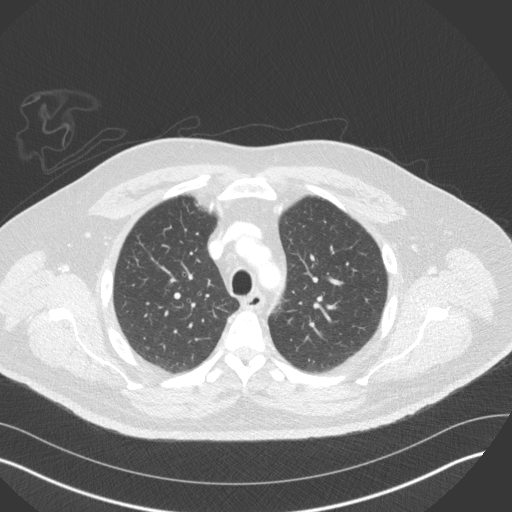
[im 106/136  lung]
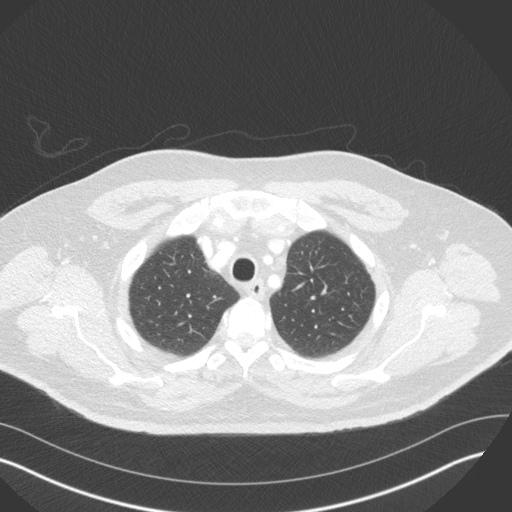
[im 116/136  lung]
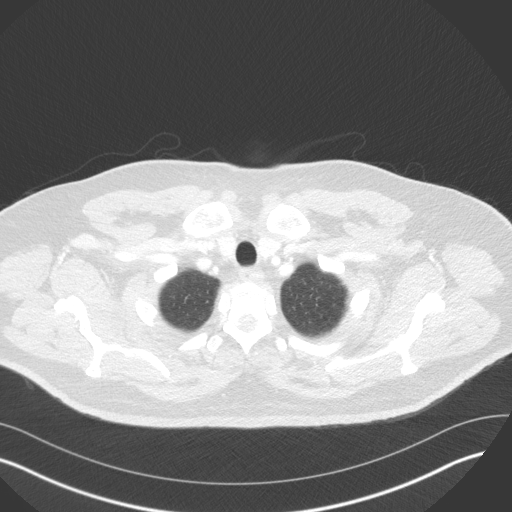
[im 126/136  lung]
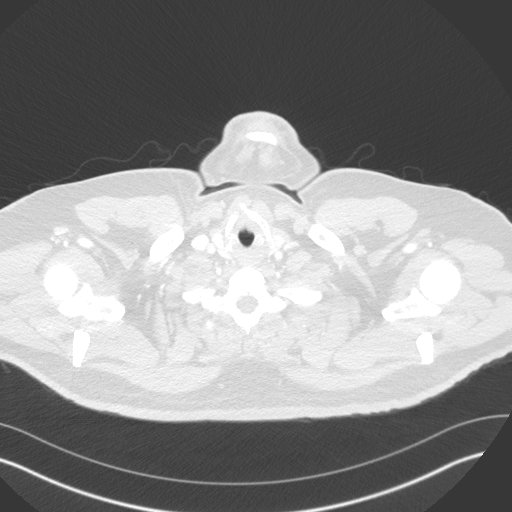

[Series 5: chest with 3mm st cor · coronal · 0.59mm/px · 3 of 97 slices shown]
[im 20/97  lung]
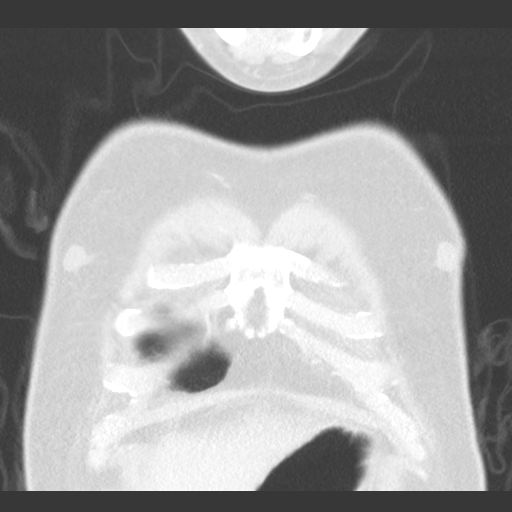
[im 39/97  lung]
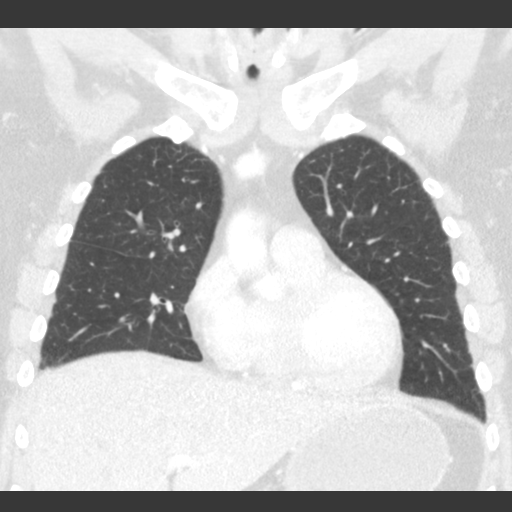
[im 58/97  lung]
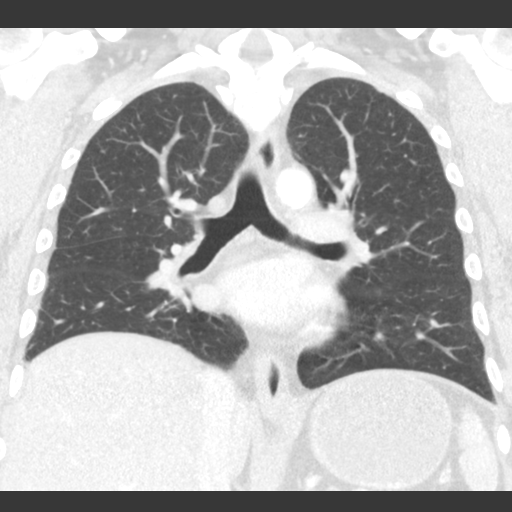

[15 of 36 positions shown; findings below may reference images not displayed]

FINDINGS: Cardiovascular: Heart size is normal. There is no significant
pericardial fluid, thickening or pericardial calcification. There is
aortic atherosclerosis, as well as atherosclerosis of the great
vessels of the mediastinum and the coronary arteries, including
calcified atherosclerotic plaque in the left main, left anterior
descending, left circumflex and right coronary arteries.

Mediastinum/Nodes: No pathologically enlarged mediastinal or hilar
lymph nodes. Multiple densely calcified right hilar lymph nodes are
incidentally noted. Esophagus is unremarkable in appearance. No
axillary lymphadenopathy.

Lungs/Pleura: Calcified granuloma in the right middle lobe. A few
tiny subpleural nodules are noted along the left major fissure,
stable compared to prior studies, compatible with subpleural lymph
nodes. No other suspicious appearing pulmonary nodules or masses are
noted. No acute consolidative airspace disease. No pleural
effusions.

Upper Abdomen: Unremarkable.

Musculoskeletal: There are no aggressive appearing lytic or blastic
lesions noted in the visualized portions of the skeleton.
IMPRESSION: 1. No acute findings are noted in the thorax to account for the
patient's symptoms.
2. Aortic atherosclerosis, in addition to left main and 3 vessel
coronary artery disease. Please note that although the presence of
coronary artery calcium documents the presence of coronary artery
disease, the severity of this disease and any potential stenosis
cannot be assessed on this non-gated CT examination. Assessment for
potential risk factor modification, dietary therapy or pharmacologic
therapy may be warranted, if clinically indicated.
3. Additional incidental findings, as above.

Aortic Atherosclerosis ([8O]-[8O]).

## 2018-11-08 MED ORDER — IOHEXOL 300 MG/ML  SOLN
75.0000 mL | Freq: Once | INTRAMUSCULAR | Status: AC | PRN
  Administered 2018-11-08: 75 mL via INTRAVENOUS

## 2018-11-08 MED ORDER — DIPHENHYDRAMINE HCL 25 MG PO CAPS
50.0000 mg | ORAL_CAPSULE | Freq: Once | ORAL | Status: AC
  Administered 2018-11-08: 50 mg via ORAL
  Filled 2018-11-08: qty 2

## 2018-11-08 NOTE — Progress Notes (Signed)
Patient ID: Blake Vaughn, male   DOB: 1961/11/21, 57 y.o.   MRN: [id number] [facility] Surgery Progress Note:   2 Days Post-Op  Subjective: Denies complaints. Had first brown bm last night - blood tinged at end but mostly brown. Feeling well. Tolerating diet. He does report taking a lot of Aspirin at home which this far out from surgery is the most likely culprit.   Objective: Vital signs in last 24 hours: Temp:  [98.2 F (36.8 C)-98.4 F (36.9 C)] 98.3 F (36.8 C) (12/02 0507) Pulse Rate:  [78-81] 81 (12/02 0507) Resp:  [16-20] 19 (12/02 0507) BP: (126-140)/(68-78) 134/78 (12/02 0507) SpO2:  [98 %-100 %] 100 % (12/02 0507)  Intake/Output from previous day: 12/01 0701 - 12/02 0700 In: 815.4 [P.O.:360; I.V.:455.4] Out: -  Intake/Output this shift: No intake/output data recorded.  Physical Exam: Work of breathing is normal.  Incision OK  Lab Results:  Results for orders placed or performed during the hospital encounter of 11/04/18 (from the past 48 hour(s))  Glucose, capillary     Status: Abnormal   Collection Time: 11/06/18 11:47 AM  Result Value Ref Range   Glucose-Capillary 191 (H) 70 - 99 mg/dL  Glucose, capillary     Status: Abnormal   Collection Time: 11/06/18  5:44 PM  Result Value Ref Range   Glucose-Capillary 145 (H) 70 - 99 mg/dL  CBC     Status: Abnormal   Collection Time: 11/06/18  6:53 PM  Result Value Ref Range   WBC 6.4 4.0 - 10.5 K/uL   RBC 2.93 (L) 4.22 - 5.81 MIL/uL   Hemoglobin 8.4 (L) 13.0 - 17.0 g/dL   HCT 24.9 (L) 39.0 - 52.0 %   MCV 85.0 80.0 - 100.0 fL   MCH 28.7 26.0 - 34.0 pg   MCHC 33.7 30.0 - 36.0 g/dL   RDW 12.9 11.5 - 15.5 %   Platelets 313 150 - 400 K/uL   nRBC 0.0 0.0 - 0.2 %    Comment: Performed at [facility], [place] [address], [place], [place] [address]  Glucose, capillary     Status: Abnormal   Collection Time: 11/06/18  9:01 PM  Result Value Ref Range   Glucose-Capillary 166 (H) 70 - 99 mg/dL   Comment 1 Notify RN     Comment 2 Document in Chart   CBC     Status: Abnormal   Collection Time: 11/07/18  4:48 AM  Result Value Ref Range   WBC 5.6 4.0 - 10.5 K/uL   RBC 2.82 (L) 4.22 - 5.81 MIL/uL   Hemoglobin 7.8 (L) 13.0 - 17.0 g/dL   HCT 24.2 (L) 39.0 - 52.0 %   MCV 85.8 80.0 - 100.0 fL   MCH 27.7 26.0 - 34.0 pg   MCHC 32.2 30.0 - 36.0 g/dL   RDW 12.8 11.5 - 15.5 %   Platelets 327 150 - 400 K/uL   nRBC 0.0 0.0 - 0.2 %    Comment: Performed at [facility], [place] [address], [place], [place] [address]  Basic metabolic panel     Status: Abnormal   Collection Time: 11/07/18  4:48 AM  Result Value Ref Range   Sodium 138 135 - 145 mmol/L   Potassium 3.3 (L) 3.5 - 5.1 mmol/L   Chloride 107 98 - 111 mmol/L   CO2 23 22 - 32 mmol/L   Glucose, Bld 142 (H) 70 - 99 mg/dL   BUN 5 (L) 6 - 20  mg/dL   Creatinine, Ser 0.65 0.61 - 1.24 mg/dL   Calcium 8.1 (L) 8.9 - 10.3 mg/dL   GFR calc non Af Amer >60 >60 mL/min   GFR calc Af Amer >60 >60 mL/min   Anion gap 8 5 - 15    Comment: Performed at [facility] [address] [place], [place] [address]  Glucose, capillary     Status: Abnormal   Collection Time: 11/07/18  8:17 AM  Result Value Ref Range   Glucose-Capillary 135 (H) 70 - 99 mg/dL  Glucose, capillary     Status: Abnormal   Collection Time: 11/07/18 12:37 PM  Result Value Ref Range   Glucose-Capillary 248 (H) 70 - 99 mg/dL   Comment 1 Notify RN    Comment 2 Document in Chart   Glucose, capillary     Status: Abnormal   Collection Time: 11/07/18  5:27 PM  Result Value Ref Range   Glucose-Capillary 186 (H) 70 - 99 mg/dL  Glucose, capillary     Status: Abnormal   Collection Time: 11/07/18  9:30 PM  Result Value Ref Range   Glucose-Capillary 181 (H) 70 - 99 mg/dL  CBC     Status: Abnormal   Collection Time: 11/08/18  2:25 AM  Result Value Ref Range   WBC 6.6 4.0 - 10.5 K/uL   RBC 2.92 (L) 4.22 - 5.81 MIL/uL   Hemoglobin 8.0 (L) 13.0 - 17.0 g/dL   HCT 24.9 (L) 39.0 - 52.0 %   MCV  85.3 80.0 - 100.0 fL   MCH 27.4 26.0 - 34.0 pg   MCHC 32.1 30.0 - 36.0 g/dL   RDW 12.7 11.5 - 15.5 %   Platelets 329 150 - 400 K/uL   nRBC 0.3 (H) 0.0 - 0.2 %    Comment: Performed at [facility], [place] [address], [place], [place] [address]  Basic metabolic panel     Status: Abnormal   Collection Time: 11/08/18  2:25 AM  Result Value Ref Range   Sodium 137 135 - 145 mmol/L   Potassium 3.8 3.5 - 5.1 mmol/L   Chloride 105 98 - 111 mmol/L   CO2 24 22 - 32 mmol/L   Glucose, Bld 179 (H) 70 - 99 mg/dL   BUN 7 6 - 20 mg/dL   Creatinine, Ser 0.76 0.61 - 1.24 mg/dL   Calcium 8.7 (L) 8.9 - 10.3 mg/dL   GFR calc non Af Amer >60 >60 mL/min   GFR calc Af Amer >60 >60 mL/min   Anion gap 8 5 - 15    Comment: Performed at [facility] [address], [place],  [address]    Radiology/Results: Nm Gi Blood Loss  Result Date: 11/06/2018 CLINICAL DATA:  GI bleeding for 2 days.  Recent appendectomy. EXAM: NUCLEAR MEDICINE GASTROINTESTINAL BLEEDING SCAN TECHNIQUE: Sequential abdominal images were obtained following intravenous administration of Tc-37m labeled red blood cells. RADIOPHARMACEUTICALS:  22.0 mCi Tc-37m pertechnetate in-vitro labeled red cells. COMPARISON:  CT, 11/04/2018 FINDINGS: There are no areas of abnormal radiotracer localization to indicate a GI bleeding source. Expected physiologic uptake is noted. IMPRESSION: No evidence of a GI bleeding source. Electronically Signed   By: Lajean Manes M.D.   On: 11/06/2018 19:19    Anti-infectives: Anti-infectives (From admission, onward)   None      Assessment/Plan: Problem List: Patient Active Problem List   Diagnosis Date Noted  . Hyponatremia 11/05/2018  . Syncope due to orthostatic hypotension 11/05/2018  .  Acute blood loss anemia 11/05/2018  . Hyperkalemia 11/05/2018  . High blood urea nitrogen (BUN) 11/05/2018  . Hypoalbuminemia 11/05/2018  . Hypoproteinemia ([place]) 11/05/2018  . GI bleed 11/05/2018  . Acute  GI bleeding 11/04/2018  . Uses Spanish as primary spoken language 10/23/2018  . Obesity (BMI 30-39.9) 10/23/2018  . Mass of cecum s/p right colectomy 10/21/2018 10/22/2018  . Nocturnal leg cramps 07/16/2017  . HTN (hypertension) 11/27/2011  . Pure hypercholesterolemia 09/24/2009  . 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 Diabetes type 2, uncontrolled ([place]) 07/09/2009  . 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 [name], M.D. [facility]

## 2018-11-08 NOTE — Care Management Note (Signed)
Case Management Note  Patient Details  Name: Blake Vaughn MRN: [id number] Date of Birth: 1961-09-26  Subjective/Objective:                    Action/Plan: Patient recently discharged with [facility] program, therefore not eligible for MATCH until a full year.    Expected Discharge Date:                  Expected Discharge Plan:  Home/Self Care  In-House Referral:  Financial Counselor  Discharge planning Services     Post Acute Care Choice:  NA Choice offered to:     DME Arranged:  N/A DME Agency:  NA  HH Arranged:  NA HH Agency:  NA  Status of Service:  Completed, signed off  If discussed at [place] of Stay Meetings, dates discussed:    Additional Comments:  Marilu Favre, RN 11/08/2018, 11:57 AM

## 2018-11-08 NOTE — Progress Notes (Signed)
Oncology Short note  I was called about medical oncology consult for this patient. Patient has outpatient f/u with my collegue Dr Burr Medico on 11/10/2018 at the cancer center. Did message and inform Dr Burr Medico that patient might still be inpatient to consider seeing in the hospital vs rescheduling outpatient followup.  Sullivan Lone MD MS

## 2018-11-08 NOTE — Telephone Encounter (Signed)
New patient appt has been rescheduled for the pt to see Dr. Burr Medico on 12/9 at 230pm due to the pt being hospitalized.

## 2018-11-08 NOTE — Progress Notes (Signed)
I saw and examined this patient.  I discussed with full resident team in rounds.  We agreed on a treatment plan.  I will co-sign the resident note when available.   [name] is recovering uneventfully from his right hemicolectomy.  Hgb has been stable for several days.  His reports of melanotic stools since surg are declining.  Eating, drinking, peeing and pooping.  Ambulates without difficulty.  OK to DC today with FU per oncology.

## 2018-11-08 NOTE — Progress Notes (Signed)
[facility] PROGRESS NOTE Subjective Patient without any signs of gross bleeding.  Had recent bowel movement that was brown.  He is eating without any severe pain.  Pathology shows poorly differentiated adenocarcinoma with extension into the lymph nodes.  Patient to to follow-up with oncology  Objective: Vital signs in last 24 hours: Temp:  [98.2 F (36.8 C)-98.4 F (36.9 C)] 98.3 F (36.8 C) (12/02 0507) Pulse Rate:  [78-81] 81 (12/02 0507) Resp:  [16-20] 19 (12/02 0507) BP: (126-140)/(68-78) 134/78 (12/02 0507) SpO2:  [98 %-100 %] 100 % (12/02 0507) Last BM Date: 11/07/18  Intake/Output from previous day: 12/01 0701 - 12/02 0700 In: 815.4 [P.O.:360; I.V.:455.4] Out: -  Intake/Output this shift: Total I/O In: 240 [P.O.:240] Out: -     Lab Results: Recent Labs    11/05/18 2357 11/06/18 0400 11/06/18 1853 11/07/18 0448 11/08/18 0225  WBC  --  7.9 6.4 5.6 6.6  HGB 7.8* 8.6* 8.4* 7.8* 8.0*  HCT 24.1* 26.4* 24.9* 24.2* 24.9*  PLT  --  318 313 327 329   BMET Recent Labs    11/05/18 1259 11/06/18 0400 11/07/18 0448 11/08/18 0225  NA  --  138 138 137  K 4.0 3.8 3.3* 3.8  CL  --  107 107 105  CO2  --  22 23 24   CREATININE  --  0.83 0.65 0.76   LFT No results for input(s): PROT, AST, ALT, ALKPHOS, BILITOT, BILIDIR, IBILI in the last 72 hours. PT/INR No results for input(s): LABPROT, INR in the last 72 hours. PANCREAS No results for input(s): LIPASE in the last 72 hours.       Studies/Results: Nm Gi Blood Loss  Result Date: 11/06/2018 CLINICAL DATA:  GI bleeding for 2 days.  Recent appendectomy. EXAM: NUCLEAR MEDICINE GASTROINTESTINAL BLEEDING SCAN TECHNIQUE: Sequential abdominal images were obtained following intravenous administration of Tc-22m labeled red blood cells. RADIOPHARMACEUTICALS:  22.0 mCi Tc-25m pertechnetate in-vitro labeled red cells. COMPARISON:  CT, 11/04/2018 FINDINGS: There are no areas of abnormal radiotracer localization to  indicate a GI bleeding source. Expected physiologic uptake is noted. IMPRESSION: No evidence of a GI bleeding source. Electronically Signed   By: Lajean Manes M.D.   On: 11/06/2018 19:19    Medications: I have reviewed the patient's current medications.  Assessment:   1.  GI bleed.  This apparently has resolved.  He is due to follow-up with oncology.  Do not feel that we need to do anything further at this time.   Plan: We will sign off but be available if we can be of any further help please give [place] a call.   Nancy Fetter 11/08/2018, 11:56 AM  This note was created using voice recognition software. Minor errors may Have occurred unintentionally.  Pager: [phone] If no answer or after hours call [phone]

## 2018-11-08 NOTE — Discharge Summary (Signed)
[facility] Discharge Summary  Patient name: Blake Vaughn Medical record number: [id number] Date of birth: 28-Dec-1960 Age: 57 y.o. Gender: male Date of Admission: 11/04/2018  Date of Discharge: 11/08/2018 Admitting Physician: [name], MD  Primary Care Provider: [name], [name], DO Consultants: GI, oncology, surgery  Indication for Hospitalization: bloody stool s/p right hemicolectomy.   Discharge Diagnoses/Problem List:  Bright red blood per rectum Anemia Cecal adenocarcinoma HTN Diabetes mellitus II  Disposition: home  Discharge Condition: improved, stable  Discharge Exam:  BP 120/68 (BP Location: Left Arm)   Pulse 78   Temp 98.6 F (37 C) (Oral)   Resp 20   Ht 5\' 2"  (1.575 m)   Wt 90.7 kg   SpO2 99%   BMI 36.57 kg/m   Gen: NAD, alert, non-toxic, well-nourished, well-appearing, sitting comfortably  HEENT: Normocephaic, atraumatic. Clear conjuctiva, no scleral icterus and injection.  CV: Regular rate and rhythm.  Normal S1-S2.   Normal capillary refill bilaterally.  Radial pulses 2+ bilaterally. No bilateral lower extremity edema. Resp: Clear to auscultation bilaterally.  No wheezing, rales, abnormal lung sounds.  No increased work of breathing appreciated. Abd: Nontender and nondistended on palpation to all 4 quadrants.  Positive bowel sounds. Psych: Cooperative with exam. Pleasant. Makes eye contact. Extremities: Full ROM   Brief Hospital Course:  Patient presented to ED on 11/28 with bloody stools. When inpatient team went to interview patient he was found collapsed on bed after a syncopal episode.  Glucose was 201, EKG was normal.  Patient was given 1L bolus and improved. In ED hgb was 9.6, CT abdomen showed possible inflammation in RLQ, but no abscess. Hgb several hours later was 6.8 and he received 2 U pRBC. Hgb remained stable at 8-9 g/dl thereafter. GI was consulted regarding the bleed and ordered a GI bleeding scan which  did not identify a source of bleeding, and colonoscopy as an outpatient procedure.  Surgery ordered a chest CT to complete staging.  Oncology was consulted to discuss outpatient followup.  At time of discharge patient was hemodynamically stable, with resolved rectal bleeding.    Issues for Follow Up:  1. Anemia - CBC to check hemoglobin, which was 8.0 on discharge.  2. Glipizide - holding during admission.  Consider restarting during followup.   3. Oncologist - patient has followup with Dr. Burr Medico on 12/9 to discuss therapy 4. Gastroenterologist - patient was told to schedule followup with [facility] gastroenterology in 4 weeks to schedule a colonoscopy  Significant Procedures: GI bleeding scan, upper endoscopy  Significant Labs and Imaging:  Recent Labs  Lab 11/06/18 1853 11/07/18 0448 11/08/18 0225  WBC 6.4 5.6 6.6  HGB 8.4* 7.8* 8.0*  HCT 24.9* 24.2* 24.9*  PLT 313 327 329   Recent Labs  Lab 11/04/18 1903 11/05/18 0303 11/05/18 1259 11/06/18 0400 11/07/18 0448 11/08/18 0225  NA 135 133*  --  138 138 137  K 5.2* 5.9* 4.0 3.8 3.3* 3.8  CL 102 107  --  107 107 105  CO2 24 23  --  22 23 24   GLUCOSE 255* 200*  --  137* 142* 179*  BUN 34* 32*  --  13 5* 7  CREATININE 1.02 1.03  --  0.83 0.65 0.76  CALCIUM 8.6* 7.7*  --  8.5* 8.1* 8.7*  MG  --   --  1.9  --   --   --   ALKPHOS  --  49  --   --   --   --  AST  --  14*  --   --   --   --   ALT  --  16  --   --   --   --   ALBUMIN  --  2.5*  --   --   --   --       Results/Tests Pending at Time of Discharge: none  Discharge Medications:  Allergies as of 11/08/2018      Reactions   Iohexol Other (See Comments)    Code: HIVES, Desc: pt had itching 8 hrs after 6/10 scan;and now was today 08/16/09 was given 50mg  benadryl 1 hr before scan and he did fine.Amy Rogal, Onset Date: [id number] **PLEASE HAVE PT TAKE 50MG  OF BENADRYL 1HR PRIOR TO SCAN **      Medication List    STOP taking these medications   acetaminophen 500 MG  tablet Commonly known as:  TYLENOL   aspirin EC 81 MG tablet   glipiZIDE 10 MG tablet Commonly known as:  GLUCOTROL   oxyCODONE 5 MG immediate release tablet Commonly known as:  Oxy IR/ROXICODONE     TAKE these medications   Alcohol Swabs Pads Use as instructed   atorvastatin 10 MG tablet Commonly known as:  LIPITOR Take 1 tablet (10 mg total) by mouth daily.   docusate sodium 100 MG capsule Commonly known as:  COLACE Take 1 capsule (100 mg total) by mouth 2 (two) times daily.   feeding supplement Liqd Take 237 mLs by mouth 2 (two) times daily between meals.   glucose blood test strip Use as instructed   metFORMIN 1000 MG tablet Commonly known as:  GLUCOPHAGE Take 1 tablet (1,000 mg total) by mouth 2 (two) times daily with a meal.   metoprolol tartrate 25 MG tablet Commonly known as:  LOPRESSOR Take 0.5 tablets (12.5 mg total) by mouth 2 (two) times daily.   polyethylene glycol packet Commonly known as:  MIRALAX / GLYCOLAX Take 17 g by mouth daily.   RELION LANCETS STANDARD 21G Misc Use as instructed   RELION PRIME MONITOR Devi Use device as instructed   saccharomyces boulardii 250 MG capsule Commonly known as:  FLORASTOR Take 1 capsule (250 mg total) by mouth 2 (two) times daily.       Discharge Instructions: Please refer to Patient Instructions section of EMR for full details.  Patient was counseled important signs and symptoms that should prompt return to medical care, changes in medications, dietary instructions, activity restrictions, and follow up appointments.   Follow-Up Appointments: Follow-up Information    Gastroenterology, [facility]. Schedule an appointment as soon as possible for a visit in 4 week(s).   Why:  newly diagnosed colon cancer, recent lower GI bleed, anemia Contact information: [address] [place] [address] [phone]        [facility]. Schedule an appointment as soon as possible  for a visit.   Contact information: [place] [id number] [address] [facility] .   Specialty:  Radiology Contact information: [address] [id number] [place] [id number] [phone]          Benay Pike, MD 11/09/2018, 6:58 PM PGY-1, [place]

## 2018-11-10 ENCOUNTER — Telehealth: Payer: Self-pay | Admitting: Hematology

## 2018-11-10 ENCOUNTER — Ambulatory Visit: Payer: Self-pay | Admitting: Hematology

## 2018-11-10 NOTE — Telephone Encounter (Signed)
Pt has been cld and rescheduled to see Dr. Burr Medico on 12/6 at 230pm. Mr. Schweiss is aware to arrive 30 minutes early.

## 2018-11-11 ENCOUNTER — Other Ambulatory Visit: Payer: Self-pay

## 2018-11-11 ENCOUNTER — Telehealth: Payer: Self-pay

## 2018-11-11 NOTE — Telephone Encounter (Signed)
  Oncology Nurse Navigator Documentation     Attempted to call patient to introduce myself and explain role of GI Navigator. No answer, No VM. Will plan to meet patient at initial med/onc appointment on 11/12/18.

## 2018-11-11 NOTE — Progress Notes (Signed)
Tumor Board Discussion 11/13/18  Adjuvant Folfox Referral to rad/onc

## 2018-11-12 ENCOUNTER — Encounter: Payer: Self-pay | Admitting: Hematology

## 2018-11-12 ENCOUNTER — Encounter: Payer: Self-pay | Admitting: General Practice

## 2018-11-12 ENCOUNTER — Inpatient Hospital Stay: Payer: Medicaid Other | Attending: Hematology | Admitting: Hematology

## 2018-11-12 VITALS — BP 142/74 | HR 92 | Temp 97.8°F | Resp 18 | Ht 65.5 in | Wt 185.3 lb

## 2018-11-12 DIAGNOSIS — G4700 Insomnia, unspecified: Secondary | ICD-10-CM | POA: Diagnosis not present

## 2018-11-12 DIAGNOSIS — E7800 Pure hypercholesterolemia, unspecified: Secondary | ICD-10-CM | POA: Diagnosis not present

## 2018-11-12 DIAGNOSIS — Z5111 Encounter for antineoplastic chemotherapy: Secondary | ICD-10-CM | POA: Diagnosis not present

## 2018-11-12 DIAGNOSIS — D500 Iron deficiency anemia secondary to blood loss (chronic): Secondary | ICD-10-CM | POA: Insufficient documentation

## 2018-11-12 DIAGNOSIS — Z8572 Personal history of non-Hodgkin lymphomas: Secondary | ICD-10-CM

## 2018-11-12 DIAGNOSIS — C180 Malignant neoplasm of cecum: Secondary | ICD-10-CM

## 2018-11-12 DIAGNOSIS — C182 Malignant neoplasm of ascending colon: Secondary | ICD-10-CM | POA: Insufficient documentation

## 2018-11-12 DIAGNOSIS — K5900 Constipation, unspecified: Secondary | ICD-10-CM | POA: Insufficient documentation

## 2018-11-12 DIAGNOSIS — Z79899 Other long term (current) drug therapy: Secondary | ICD-10-CM | POA: Insufficient documentation

## 2018-11-12 DIAGNOSIS — E119 Type 2 diabetes mellitus without complications: Secondary | ICD-10-CM | POA: Insufficient documentation

## 2018-11-12 DIAGNOSIS — C786 Secondary malignant neoplasm of retroperitoneum and peritoneum: Secondary | ICD-10-CM | POA: Diagnosis not present

## 2018-11-12 NOTE — Progress Notes (Addendum)
[place]  [phone] Fax:([phone]) [place] Note   Patient Care Team: [name], [name], DO as PCP - General [name], MD as Consulting Physician (Colon and Rectal Surgery) [name], MD as Consulting Physician (Hematology) 11/12/2018  Referring physician: Dr. [name]  CHIEF COMPLAINTS/PURPOSE OF CONSULTATION:  Newly diagnosed colon cancer, S/P resection   Oncology History   Cancer Staging Cancer of right colon [facility]) Staging form: Colon and Rectum, AJCC 8th Edition - Pathologic stage from 10/21/2018: Stage IVC (pT4b, pN1a, pM1c) - Signed by [name], MD on 19/05/2228  Follicular lymphoma ([place]), History of Staging form: Lymphoid Neoplasms, AJCC 6th Edition - Clinical: Stage II - Signed by Curt Bears, MD on 7/98/9211       Follicular lymphoma [facility]), History of   11/2009 Initial Diagnosis    Follicular lymphoma ([place]), History of     Chemotherapy    1) Status post 6 cycles of systemic chemotherapy with CHOP/Rituxan last dose given 05/01/2009.  2) Maintenance Rituxan at 375 mg per meter square given every 2 months status post 12 cycles      Cancer of right colon ([place])   10/21/2018 Surgery    Exploratory laparotomy right hemicolectomy by Dr. [name] and Dr. [name]  10/21/18    10/21/2018 Pathology Results    Diagnosis 10/21/18 Colon, segmental resection for tumor, right ascending and appendix - ADENOCARCINOMA, MODERATE TO POORLY DIFFERENTIATED (4 CM) - METASTATIC CARCINOMA INVOLVING ONE OF EIGHTEEN LYMPH NODES (1/18) - CARCINOMA EXTENDS INTO THE APPENDIX - TWO TUMOR DEPOSITS PRESENT - SEE ONCOLOGY TABLE AND COMMENT BELOW    10/21/2018 Cancer Staging    Staging form: Colon and Rectum, AJCC 8th Edition - Pathologic stage from 10/21/2018: Stage IVC (pT4b, pN1a, pM1c) - Signed by [name], MD on 11/13/2018    11/04/2018 Imaging    CT AP W Contrast 11/04/18  IMPRESSION: 1. Interval appendectomy. There is  residual soft tissue thickening and stranding in the right lower quadrant adjacent to the cecum which may represent ongoing inflammation versus postsurgical changes. A small soft tissue density adjacent to the surgical sutures may reflect small hematoma or operative collection. No large focal fluid collection to suggest drainable abscess allowing for absence of contrast 2. Fluid-filled colon without wall thickening, could reflect diarrheal process    11/08/2018 Imaging    CT Chest W Contrast 11/08/18  IMPRESSION: 1. No acute findings are noted in the thorax to account for the patient's symptoms. 2. Aortic atherosclerosis, in addition to left main and 3 vessel coronary artery disease. Please note that although the presence of coronary artery calcium documents the presence of coronary artery disease, the severity of this disease and any potential stenosis cannot be assessed on this non-gated CT examination. Assessment for potential risk factor modification, dietary therapy or pharmacologic therapy may be warranted, if clinically indicated. 3. Additional incidental findings, as above. Aortic Atherosclerosis (ICD10-I70.0).    11/12/2018 Initial Diagnosis    Cancer of right colon ([place])     HISTORY OF PRESENTING ILLNESS:  Blake Vaughn 57 y.o. male is a here because of Newly diagnosed colon cancer. The patient was referred by his surgeon Dr. [name]. The patient presents to the clinic today accompanied by his brother and Spanish interrater.   He notes rapid onset of lower abdominal pain and presented to the ED the next day. He denies rectal bleeding or change in bowel habit. He denies any other issues before this pain presented. He was losing weight  but did not monitor how much was lost. He has lost weight after surgery.  After he had his staples removed her had rectal bleeding on 11/04/18 and went back to hospital. He denies any more bleeding since then.    Today he notes he is recovery  well but slowly. He feels about 80% back to baseline. He notes getting dizziness occasionally. He does not check his BP at home. He denies LE edema and notes occasional chest discomfort but only recently, post-surgery. He is reluctant to use Port and rather use peripheral veins. He notes he doe snot currently has insurance. 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 His wife is in [place] currently. His children and brothers to help him at home.   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 He works in [place] with wife and has 2 children.  He has a PMHx of DM on metformin and H/o Lymphoma. LN were in cervical, thoracis and abdominal locations according to him. No surgical history prior to this. He is not sure of the whole family medical history. Both parent died from DM. One of his brothers had lymphoma, treatment completed.  He quit smoking in 2006 after smoking 1/2 or less per day for about 30 years. He drinks occasionally.     REVIEW OF SYSTEMS:   Constitutional: Denies fevers, chills or abnormal night sweats (+) occasional dizziness Eyes: Denies blurriness of vision, double vision or watery eyes Ears, nose, mouth, throat, and face: Denies mucositis or sore throat Respiratory: Denies cough, dyspnea or wheezes Cardiovascular: Denies palpitation, chest discomfort or lower extremity swelling Gastrointestinal:  Denies nausea, heartburn or change in bowel habits Skin: Denies abnormal skin rashes Lymphatics: Denies new lymphadenopathy or easy bruising Neurological:Denies numbness, tingling or new weaknesses Behavioral/Psych: Mood is stable, no new changes  All other systems were reviewed with the patient and are negative.   MEDICAL HISTORY:  Past Medical History:  Diagnosis Date  . Appendicitis 10/21/2018  . Diabetes mellitus   . Follicular lymphoma ([place]), 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   Qualifier: Diagnosis of  By: [name], [name]   .  Hypercholesterolemia   . Hypertension 11/27/2011  . Lymphoma [facility])    gets annual chemo last tx Feb 2013  . TOBACCO USE, QUIT 08/18/2009   Qualifier: Diagnosis of  By: Drue Flirt  MD, Merrily Brittle      SURGICAL HISTORY: Past Surgical History:  Procedure Laterality Date  . ESOPHAGOGASTRODUODENOSCOPY (EGD) WITH PROPOFOL N/A 11/05/2018   Procedure: ESOPHAGOGASTRODUODENOSCOPY (EGD) WITH PROPOFOL;  Surgeon: [name], MD;  Location: [place];  Service: Endoscopy;  Laterality: N/A;  . LAPAROSCOPIC APPENDECTOMY N/A 10/21/2018   Procedure: Exploratory laparotomy right hemicolectomy;  Surgeon: [name], MD;  Location: [facility];  Service: General;  Laterality: N/A;    SOCIAL HISTORY: Social History   Socioeconomic History  . Marital status: Married    Spouse name: Not on file  . Number of children: 2  . Years of education: Not on file  . Highest education level: Not on file  Occupational History  . Not on file  Social Needs  . Financial resource strain: Not on file  . Food insecurity:    Worry: Not on file    Inability: Not on file  . Transportation needs:    Medical: Not on file    Non-medical: Not on file  Tobacco Use  . Smoking status: Former Smoker    Packs/day: 0.50  Years: 30.00    Pack years: 15.00    Last attempt to quit: 12/08/2004    Years since quitting: 13.9  . Smokeless tobacco: Former Systems developer    Quit date: 11/20/2006  Substance and Sexual Activity  . Alcohol use: No  . Drug use: No  . Sexual activity: Not on file  Lifestyle  . Physical activity:    Days per week: Not on file    Minutes per session: Not on file  . Stress: Not on file  Relationships  . Social connections:    Talks on phone: Not on file    Gets together: Not on file    Attends religious service: Not on file    Active member of club or organization: Not on file    Attends meetings of clubs or organizations: Not on file    Relationship status: Not on file  . Intimate partner  violence:    Fear of current or ex partner: Not on file    Emotionally abused: Not on file    Physically abused: Not on file    Forced sexual activity: Not on file  Other Topics Concern  . Not on file  Social History Narrative  . Not on file    FAMILY HISTORY: Family History  Problem Relation Age of Onset  . Diabetes Mother   . Diabetes Father   . Renal Disease Brother   . Diabetes Brother   . Cancer Brother        lymphoma     ALLERGIES:  is allergic to iohexol.  MEDICATIONS:  Current Outpatient Medications  Medication Sig Dispense Refill  . Alcohol Swabs PADS Use as instructed 100 each 6  . atorvastatin (LIPITOR) 10 MG tablet Take 1 tablet (10 mg total) by mouth daily. 90 tablet 4  . Blood Glucose Monitoring Suppl (RELION PRIME MONITOR) DEVI Use device as instructed 1 Device 0  . docusate sodium (COLACE) 100 MG capsule Take 1 capsule (100 mg total) by mouth 2 (two) times daily. 10 capsule 0  . feeding supplement (ENSURE SURGERY) LIQD Take 237 mLs by mouth 2 (two) times daily between meals.    Marland Kitchen glucose blood test strip Use as instructed 100 each 12  . metFORMIN (GLUCOPHAGE) 1000 MG tablet Take 1 tablet (1,000 mg total) by mouth 2 (two) times daily with a meal. 90 tablet 4  . metoprolol tartrate (LOPRESSOR) 25 MG tablet Take 0.5 tablets (12.5 mg total) by mouth 2 (two) times daily. 60 tablet 0  . polyethylene glycol (MIRALAX / GLYCOLAX) packet Take 17 g by mouth daily. 14 each 0  . RELION LANCETS STANDARD 21G MISC Use as instructed 200 each 6  . saccharomyces boulardii (FLORASTOR) 250 MG capsule Take 1 capsule (250 mg total) by mouth 2 (two) times daily.     No current facility-administered medications for this visit.     PHYSICAL EXAMINATION: ECOG PERFORMANCE STATUS: 1 - Symptomatic but completely ambulatory  Vitals:   11/12/18 1455  BP: (!) 142/74  Pulse: 92  Resp: 18  Temp: 97.8 F (36.6 C)  SpO2: 100%   Filed Weights   11/12/18 1455  Weight: 185 lb 4.8 oz  (84.1 kg)    GENERAL:alert, no distress and comfortable SKIN: skin color, texture, turgor are normal, no rashes or significant lesions EYES: normal, conjunctiva are pink and non-injected, sclera clear OROPHARYNX:no exudate, no erythema and lips, buccal mucosa, and tongue normal  NECK: supple, thyroid normal size, non-tender, without nodularity LYMPH:  no palpable  lymphadenopathy in the cervical, axillary or inguinal LUNGS: clear to auscultation and percussion with normal breathing effort HEART: regular rate & rhythm and no murmurs and no lower extremity edema ABDOMEN:abdomen soft, non-tender and normal bowel sounds (+) long midline surgical incision healing well with redness below umbilical, like small ulcer, no bleeding  Musculoskeletal:no cyanosis of digits and no clubbing  PSYCH: alert & oriented x 3 with fluent speech NEURO: no focal motor/sensory deficits  LABORATORY DATA:  I have reviewed the data as listed CBC Latest Ref Rng & Units 11/08/2018 11/07/2018 11/06/2018  WBC 4.0 - 10.5 K/uL 6.6 5.6 6.4  Hemoglobin 13.0 - 17.0 g/dL 8.0(L) 7.8(L) 8.4(L)  Hematocrit 39.0 - 52.0 % 24.9(L) 24.2(L) 24.9(L)  Platelets 150 - 400 K/uL 329 327 313    CMP Latest Ref Rng & Units 11/08/2018 11/07/2018 11/06/2018  Glucose 70 - 99 mg/dL 179(H) 142(H) 137(H)  BUN 6 - 20 mg/dL 7 5(L) 13  Creatinine 0.61 - 1.24 mg/dL 0.76 0.65 0.83  Sodium 135 - 145 mmol/L 137 138 138  Potassium 3.5 - 5.1 mmol/L 3.8 3.3(L) 3.8  Chloride 98 - 111 mmol/L 105 107 107  CO2 22 - 32 mmol/L _0 Calcium 8.9 - 10.3 mg/dL 8.7(L) 8.1(L) 8.5(L)  Total Protein 6.5 - 8.1 g/dL - - -  Total Bilirubin 0.3 - 1.2 mg/dL - - -  Alkaline Phos 38 - 126 U/L - - -  AST 15 - 41 U/L - - -  ALT 0 - 44 U/L - - -    RADIOGRAPHIC STUDIES: I have personally reviewed the radiological images as listed and agreed with the findings in the report. Ct Abdomen Pelvis Wo Contrast  Result Date: 11/04/2018 CLINICAL DATA:  Abdomen pain, rectal  bleeding EXAM: CT ABDOMEN AND PELVIS WITHOUT CONTRAST TECHNIQUE: Multidetector CT imaging of the abdomen and pelvis was performed following the standard protocol without IV contrast. COMPARISON:  CT 10/21/2018, 09/09/2016 FINDINGS: Lower chest: Lung bases demonstrate no acute consolidation or effusion. Heart size is normal. Calcified granuloma in the right middle lobe. Hepatobiliary: No focal liver abnormality is seen. No gallstones, gallbladder wall thickening, or biliary dilatation. Pancreas: Unremarkable. No pancreatic ductal dilatation or surrounding inflammatory changes. Spleen: Normal in size without focal abnormality. Adrenals/Urinary Tract: Adrenal glands are unremarkable. Kidneys are normal, without renal calculi, focal lesion, or hydronephrosis. Bladder is unremarkable. Stomach/Bowel: Stomach is nonenlarged. No dilated small bowel. Diffuse fluid within the colon without wall thickening. Interval appendectomy. Residual soft tissue thickening and inflammatory changes in the right lower quadrant adjacent to the cecum. Vascular/Lymphatic: Mild aortic atherosclerosis. No aneurysm. Small retroperitoneal nodes Reproductive: Prostate is unremarkable. Other: No free air Musculoskeletal: No acute or significant osseous findings. IMPRESSION: 1. Interval appendectomy. There is residual soft tissue thickening and stranding in the right lower quadrant adjacent to the cecum which may represent ongoing inflammation versus postsurgical changes. A small soft tissue density adjacent to the surgical sutures may reflect small hematoma or operative collection. No large focal fluid collection to suggest drainable abscess allowing for absence of contrast 2. Fluid-filled colon without wall thickening, could reflect diarrheal process Electronically Signed   By: Donavan Foil M.D.   On: 11/04/2018 20:40   Ct Abdomen Pelvis Wo Contrast  Result Date: 10/21/2018 CLINICAL DATA:  Abdominal pain.  Suspect appendicitis EXAM: CT  ABDOMEN AND PELVIS WITHOUT CONTRAST TECHNIQUE: Multidetector CT imaging of the abdomen and pelvis was performed following the standard protocol without IV contrast. COMPARISON:  CT abdomen pelvis 09/09/2016 FINDINGS:  Lower chest: Calcified granuloma right middle lobe. Lung bases clear. Hepatobiliary: No focal liver abnormality is seen. No gallstones, gallbladder wall thickening, or biliary dilatation. Pancreas: Negative Spleen: Negative Adrenals/Urinary Tract: Adrenal glands are unremarkable. Kidneys are normal, without renal calculi, focal lesion, or hydronephrosis. Bladder is unremarkable. 1 cm left upper pole renal cyst unchanged from the prior study. Stomach/Bowel: Normal stomach.  Negative for bowel obstruction. Dilated appendix 15 mm. Fluid-filled appendix with periappendiceal stranding compatible with acute appendicitis. No abscess or appendicoliths identified. Vascular/Lymphatic: Mild atherosclerotic disease in the aorta. Negative for lymphadenopathy. Reproductive: Prostate enlargement. Other: No free fluid or free air. Musculoskeletal: No acute skeletal abnormality. IMPRESSION: Acute appendicitis without abscess or appendicoliths. No evidence of perforation. Electronically Signed   By: Franchot Gallo M.D.   On: 10/21/2018 19:29   Dg Shoulder Right  Result Date: 10/17/2018 CLINICAL DATA:  Pain. EXAM: RIGHT SHOULDER - 2+ VIEW COMPARISON:  None. FINDINGS: Glenohumeral degenerative changes. No fracture or other acute abnormality. IMPRESSION: Degenerative changes. Electronically Signed   By: Dorise Bullion III M.D   On: 10/17/2018 21:41   Ct Chest W Contrast  Result Date: 11/08/2018 CLINICAL DATA:  57 year old male with history of hemoptysis and rectal bleeding. EXAM: CT CHEST WITH CONTRAST TECHNIQUE: Multidetector CT imaging of the chest was performed during intravenous contrast administration. CONTRAST:  16m OMNIPAQUE IOHEXOL 300 MG/ML  SOLN COMPARISON:  CT chest 09/09/2016. FINDINGS:  Cardiovascular: Heart size is normal. There is no significant pericardial fluid, thickening or pericardial calcification. There is aortic atherosclerosis, as well as atherosclerosis of the great vessels of the mediastinum and the coronary arteries, including calcified atherosclerotic plaque in the left main, left anterior descending, left circumflex and right coronary arteries. Mediastinum/Nodes: No pathologically enlarged mediastinal or hilar lymph nodes. Multiple densely calcified right hilar lymph nodes are incidentally noted. Esophagus is unremarkable in appearance. No axillary lymphadenopathy. Lungs/Pleura: Calcified granuloma in the right middle lobe. A few tiny subpleural nodules are noted along the left major fissure, stable compared to prior studies, compatible with subpleural lymph nodes. No other suspicious appearing pulmonary nodules or masses are noted. No acute consolidative airspace disease. No pleural effusions. Upper Abdomen: Unremarkable. Musculoskeletal: There are no aggressive appearing lytic or blastic lesions noted in the visualized portions of the skeleton. IMPRESSION: 1. No acute findings are noted in the thorax to account for the patient's symptoms. 2. Aortic atherosclerosis, in addition to left main and 3 vessel coronary artery disease. Please note that although the presence of coronary artery calcium documents the presence of coronary artery disease, the severity of this disease and any potential stenosis cannot be assessed on this non-gated CT examination. Assessment for potential risk factor modification, dietary therapy or pharmacologic therapy may be warranted, if clinically indicated. 3. Additional incidental findings, as above. Aortic Atherosclerosis (ICD10-I70.0). Electronically Signed   By: DVinnie LangtonM.D.   On: 11/08/2018 14:27   Nm Gi Blood Loss  Result Date: 11/06/2018 CLINICAL DATA:  GI bleeding for 2 days.  Recent appendectomy. EXAM: NUCLEAR MEDICINE  GASTROINTESTINAL BLEEDING SCAN TECHNIQUE: Sequential abdominal images were obtained following intravenous administration of Tc-932mabeled red blood cells. RADIOPHARMACEUTICALS:  22.0 mCi Tc-9919mrtechnetate in-vitro labeled red cells. COMPARISON:  CT, 11/04/2018 FINDINGS: There are no areas of abnormal radiotracer localization to indicate a GI bleeding source. Expected physiologic uptake is noted. IMPRESSION: No evidence of a GI bleeding source. Electronically Signed   By: DavLajean ManesD.   On: 11/06/2018 19:19      ASSESSMENT & PLAN:  Blake Vaughn is a 57 y.o. Hispanic male with a history of DM, HTN and H/o follicular lymphoma and Hypercholesterolemia.    1. Right Cecal adenocarcinoma, pT4bN1aM1c, Stage IVC, with limited peritoneal metastasis,  Grade II-III, MMR normal  -I discussed and reviewed surgical pathology and image findings in great detail with patient and his brother.  -He is s/p right hemicolectomy on 10/21/18. His pathology shows locally advanced poorly differentiated adenocarcinoma. The tumor has invaded into appendix, and surrounding lymph node and peritoneum. We discussed his case in our GI tumor board, per Dr. [name], the 2 tumor deposits were from the peritoneum near to the cecum, so he has limited peritoneal mets. No other distant metastasis was found on CT and during laparoscopic exploration. -Although he had complete surgical resection he is at high risk, probably 70-90% risk for recurrence, especially peritoneal metastasis. To reduce this risk standard care includes adjuvant chemotherapy. Per tumor board discussion, adjuvant radiation was also felt to be beneficial due to his T4b tumor and limited local peritoneal disease, although surgical margins were negative. We plan to do FOLFOX for 6 months and chemo RT for 5-6 weeks.  -I discussed chemotherapy options of FOLFOX or CAPOX. I think he will tolerate 5-FU better, although he would prefer oral chemo as it is more  convenient. Due to lack of insurance, I will start him on FOLFOX.  -I recommend PAC placement for chemo. He agreed.  -He had R-CHOP previous for lymphoma, he may have more cytopenia from chemo   -I will prescribe antiemetics for possible nausea and EMLA for port access.  -He has not had colonoscopy. Post treatment we will arrange colonoscopy. His 10/2018 Upper Endoscopy was normal.  -F/u in 2 weeks to start chemo    2. H/o Follicular Lymphoma  -Status post 6 cycles of systemic chemotherapy with CHOP/Rituxan last dose given 05/01/2009.  -Maintenance Rituxan at 375 mg per meter square given every 2 months status post 12 cycles  -Has been on observation since and last seen by Dr. Julien Nordmann in 2017. -NED so far    3. DM and Hypercholesterolemia -On metformin, Lipitor and metoprolol -He goes to [facility] for refills   4. 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 He plan to get help from [employer] financial office with applying.  -His wife is in [place] currently. He has 2 children and brothers to help him at home. -He met our social worker Webb Silversmith who will help his insurance application    5. Anemia, secondary to blood loss  -He has been treated with Blood Transfusion last week.  -I recommended he start oral iron Ferrous sulfate daily. He has Colase for constipation.  -will repeat iron level on next lab    PLAN:  -I recommend adjuvant FOLFOX for 4 months, followed by chemo RT -I will prescribe EMLA cream and antiemetics today  -Lab and chemo class next week -F/u with me or APP and chemo FOLFOX the week of 12/16 and two weeks later (with lab and flush on second dose)       No orders of the defined types were placed in this encounter.   All questions were answered. The patient knows to call the clinic with any problems, questions or concerns. I spent 55 minutes counseling the patient face  to face. The total time spent in the appointment was 60 minutes and more than 50% was on  counseling.     [name], MD 11/12/2018 4:06 PM   I, Joslyn Devon, am acting as scribe for [name], MD.   I have reviewed the above documentation for accuracy and completeness, and I agree with the above.

## 2018-11-12 NOTE — Progress Notes (Signed)
[facility] CSW Progress Note  Met w patient and brother in exam room at Dr Ernestina Penna request.  Interpreter Sunday Spillers present.  Patient wants information on applying for "emergency Medicaid."  CSW advised that Medicaid applications are taken at [place], patient advised to visit DSS to apply.  Will refer to [facility] for help w disability.  Sent information on applying for [employer], encouraged patient to go to DSS to apply for MEdicaid.  Edwyna Shell, LCSW Clinical Social Worker Phone:  [phone]

## 2018-11-12 NOTE — Progress Notes (Signed)
  Oncology Nurse Navigator Documentation  Navigator Location: [facility] (11/12/18 1630) Referral date to RadOnc/MedOnc: 10/28/18 (11/12/18 1630) )Navigator Encounter Type: Initial MedOnc (11/12/18 1630)  Met with Blake Vaughn and brother. Explained role of nurse navigator. Educational information (printed in [place]) provided on colorectal cancer and Folfox medications. Contact names and phone numbers were provided for entire [facility] team.  Patient is uninsured and will meet with [facility] financial advocate to explore sources of support. . No barriers to care identified at present time.  Will continue to follow as needed   Abnormal Finding Date: 10/21/18 (11/12/18 1630) Confirmed Diagnosis Date: 10/21/18 (11/12/18 1630)             Treatment Initiated Date: 10/21/18 (11/12/18 1630)   Treatment Phase: Pre-Tx/Tx Discussion (11/12/18 1630) Barriers/Navigation Needs: Financial (11/12/18 1630)   Interventions: Education (11/12/18 1630)     Education Method: Verbal;Written (11/12/18 1630)      Acuity: Level 2 (11/12/18 1630)   Acuity Level 2: Initial guidance, education and coordination as needed;Educational needs (11/12/18 1630)     Time Spent with Patient: 15 (11/12/18 1630)

## 2018-11-13 ENCOUNTER — Encounter: Payer: Self-pay | Admitting: Hematology

## 2018-11-13 MED ORDER — ONDANSETRON HCL 8 MG PO TABS: 8.0000 mg | ORAL_TABLET | Freq: Two times a day (BID) | ORAL | 1 refills | Status: DC | PRN

## 2018-11-13 MED ORDER — PROCHLORPERAZINE MALEATE 10 MG PO TABS: 10.0000 mg | ORAL_TABLET | Freq: Four times a day (QID) | ORAL | 1 refills | Status: DC | PRN

## 2018-11-13 MED ORDER — LIDOCAINE-PRILOCAINE 2.5-2.5 % EX CREA: TOPICAL_CREAM | CUTANEOUS | 3 refills | Status: DC

## 2018-11-13 NOTE — Progress Notes (Signed)
Patient on plan of care prior to pathways. 

## 2018-11-13 NOTE — Progress Notes (Signed)
START ON PATHWAY REGIMEN - Colorectal     A cycle is every 14 days:     Oxaliplatin      Leucovorin      5-Fluorouracil      5-Fluorouracil   **Always confirm dose/schedule in your pharmacy ordering system**  Patient Characteristics: Distant Metastases, First Line, Resectable Therapeutic Status: Distant Metastases BRAF Mutation Status: Awaiting Test Results KRAS/NRAS Mutation Status: Awaiting Test Results Line of Therapy: First Line  Intent of Therapy: Curative Intent, Discussed with Patient

## 2018-11-15 ENCOUNTER — Encounter: Payer: Self-pay | Admitting: Hematology

## 2018-11-15 ENCOUNTER — Telehealth: Payer: Self-pay

## 2018-11-15 ENCOUNTER — Ambulatory Visit: Payer: Self-pay | Admitting: Hematology

## 2018-11-15 NOTE — Telephone Encounter (Signed)
Per 12/6 los patient name added to add on book for treatment approval.

## 2018-11-16 ENCOUNTER — Other Ambulatory Visit: Payer: Self-pay | Admitting: Hematology

## 2018-11-16 NOTE — Progress Notes (Signed)
  Oncology Nurse Navigator Documentation  Appointment made for patient to establish with PCP at [facility] on 11/25/18 @ 1:50 PM. I will discuss appointment with patient during chemo education class on 11/17/18 @ 10 AM.  Interpreter will be present to assist.

## 2018-11-16 NOTE — Addendum Note (Signed)
Addended by: [name] on: 11/16/2018 12:29 PM   Modules accepted: Orders

## 2018-11-17 ENCOUNTER — Inpatient Hospital Stay: Payer: Medicaid Other

## 2018-11-17 ENCOUNTER — Telehealth: Payer: Self-pay | Admitting: Hematology

## 2018-11-17 NOTE — Telephone Encounter (Signed)
R/s appt per 12/10 sch message - called [place] interpreters to interpret message for patient .  Called patient - pt is aware of appt changes.

## 2018-11-22 ENCOUNTER — Inpatient Hospital Stay: Payer: Medicaid Other

## 2018-11-22 ENCOUNTER — Other Ambulatory Visit: Payer: Self-pay | Admitting: Radiology

## 2018-11-22 ENCOUNTER — Inpatient Hospital Stay: Payer: Medicaid Other | Admitting: Hematology

## 2018-11-24 ENCOUNTER — Ambulatory Visit (HOSPITAL_COMMUNITY)
Admission: RE | Admit: 2018-11-24 | Discharge: 2018-11-24 | Disposition: A | Discharge status: Home / Self Care | Payer: Medicaid Other | Source: Ambulatory Visit | Attending: Hematology | Admitting: Hematology

## 2018-11-24 ENCOUNTER — Ambulatory Visit: Payer: Self-pay

## 2018-11-24 ENCOUNTER — Encounter (HOSPITAL_COMMUNITY): Payer: Self-pay

## 2018-11-24 ENCOUNTER — Other Ambulatory Visit: Payer: Self-pay | Admitting: Hematology

## 2018-11-24 DIAGNOSIS — Z79899 Other long term (current) drug therapy: Secondary | ICD-10-CM | POA: Insufficient documentation

## 2018-11-24 DIAGNOSIS — C189 Malignant neoplasm of colon, unspecified: Secondary | ICD-10-CM | POA: Diagnosis not present

## 2018-11-24 DIAGNOSIS — Z888 Allergy status to other drugs, medicaments and biological substances status: Secondary | ICD-10-CM | POA: Insufficient documentation

## 2018-11-24 DIAGNOSIS — Z5111 Encounter for antineoplastic chemotherapy: Secondary | ICD-10-CM | POA: Diagnosis not present

## 2018-11-24 DIAGNOSIS — C182 Malignant neoplasm of ascending colon: Secondary | ICD-10-CM | POA: Insufficient documentation

## 2018-11-24 DIAGNOSIS — Z87891 Personal history of nicotine dependence: Secondary | ICD-10-CM | POA: Diagnosis not present

## 2018-11-24 DIAGNOSIS — I10 Essential (primary) hypertension: Secondary | ICD-10-CM | POA: Diagnosis not present

## 2018-11-24 DIAGNOSIS — E7800 Pure hypercholesterolemia, unspecified: Secondary | ICD-10-CM | POA: Diagnosis not present

## 2018-11-24 DIAGNOSIS — Z452 Encounter for adjustment and management of vascular access device: Secondary | ICD-10-CM | POA: Diagnosis not present

## 2018-11-24 DIAGNOSIS — E119 Type 2 diabetes mellitus without complications: Secondary | ICD-10-CM | POA: Insufficient documentation

## 2018-11-24 DIAGNOSIS — Z7984 Long term (current) use of oral hypoglycemic drugs: Secondary | ICD-10-CM | POA: Insufficient documentation

## 2018-11-24 HISTORY — PX: IR IMAGING GUIDED PORT INSERTION: IMG5740

## 2018-11-24 LAB — CBC WITH DIFFERENTIAL/PLATELET
Abs Immature Granulocytes: 0.04 10*3/uL (ref 0.00–0.07)
Basophils Absolute: 0 10*3/uL (ref 0.0–0.1)
Basophils Relative: 0 %
EOS ABS: 0.5 10*3/uL (ref 0.0–0.5)
Eosinophils Relative: 4 %
HCT: 35.6 % — ABNORMAL LOW (ref 39.0–52.0)
Hemoglobin: 11.7 g/dL — ABNORMAL LOW (ref 13.0–17.0)
Immature Granulocytes: 0 %
Lymphocytes Relative: 23 %
Lymphs Abs: 2.4 10*3/uL (ref 0.7–4.0)
MCH: 29.2 pg (ref 26.0–34.0)
MCHC: 32.9 g/dL (ref 30.0–36.0)
MCV: 88.8 fL (ref 80.0–100.0)
Monocytes Absolute: 0.8 10*3/uL (ref 0.1–1.0)
Monocytes Relative: 8 %
NEUTROS PCT: 65 %
Neutro Abs: 7 10*3/uL (ref 1.7–7.7)
Platelets: 213 10*3/uL (ref 150–400)
RBC: 4.01 MIL/uL — ABNORMAL LOW (ref 4.22–5.81)
RDW: 13.7 % (ref 11.5–15.5)
WBC: 10.8 10*3/uL — ABNORMAL HIGH (ref 4.0–10.5)
nRBC: 0 % (ref 0.0–0.2)

## 2018-11-24 LAB — GLUCOSE, CAPILLARY: Glucose-Capillary: 214 mg/dL — ABNORMAL HIGH (ref 70–99)

## 2018-11-24 LAB — PROTIME-INR
INR: 0.8
Prothrombin Time: 11 seconds — ABNORMAL LOW (ref 11.4–15.2)

## 2018-11-24 IMAGING — XA IR FLUORO GUIDE CV LINE*L*
1 series · 1 of 1 positions shown · non-contrast
Comparison: none

CLINICAL DATA: Colon carcinoma

[Series 300: line placements · 1 of 1 slices shown]
[im 1/1]
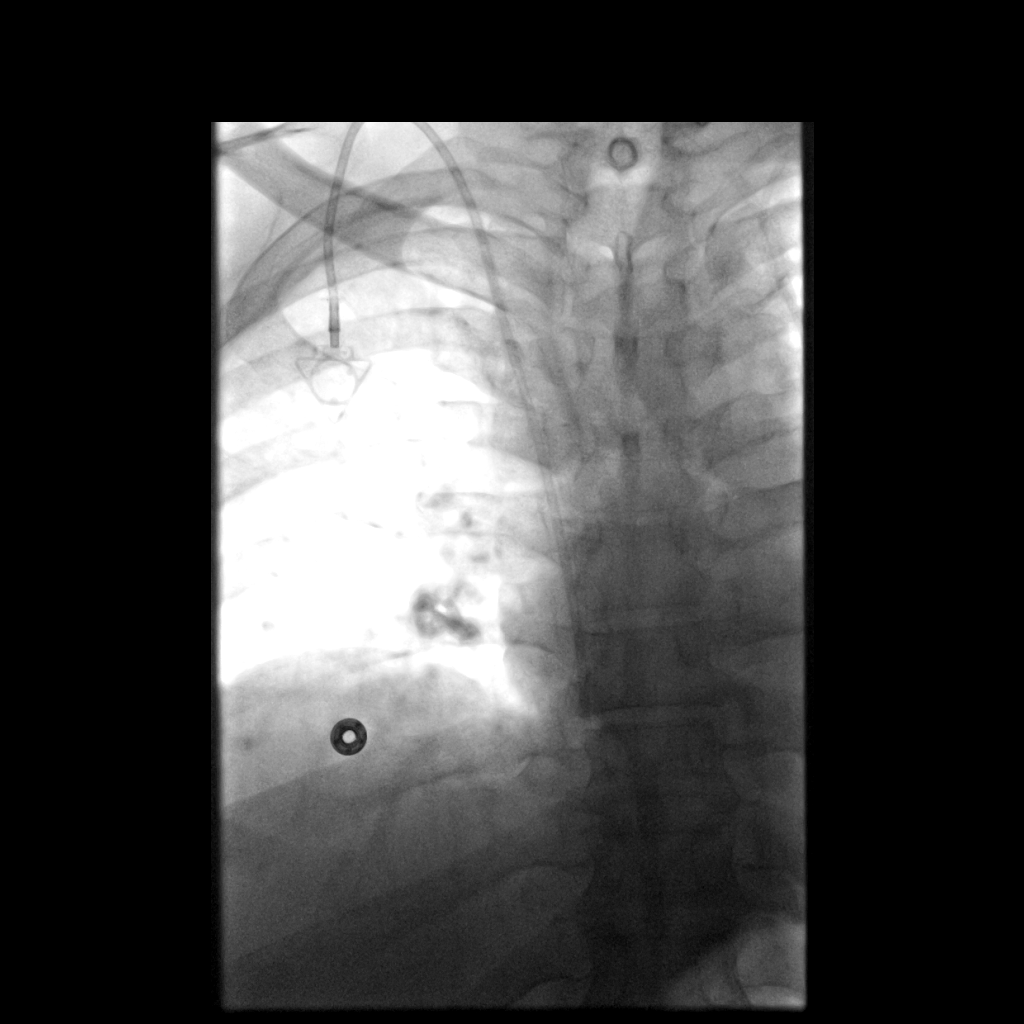

[1 of 1 positions shown; findings below may reference images not displayed]

EXAM:
TUNNEL POWER PORT PLACEMENT WITH SUBCUTANEOUS POCKET UTILIZING
ULTRASOUND & FLOUROSCOPY

FLUOROSCOPY TIME:  18 seconds.  Five mGy.

MEDICATIONS AND MEDICAL HISTORY:
Versed 4 mg, Fentanyl 100 mcg.

Additional Medications: Ancef 2 g. Antibiotics were given within 2
hours of the procedure.

ANESTHESIA/SEDATION:
Moderate sedation time: 33 minutes. Nursing monitored the the
patient during the procedure.

PROCEDURE:
After written informed consent was obtained, patient was placed in
the supine position on angiographic table. The right neck and chest
was prepped and draped in a sterile fashion. Lidocaine was utilized
for local anesthesia. The right jugular vein was noted to be patent
initially with ultrasound. Under sonographic guidance, a
micropuncture needle was inserted into the right IJ vein (Ultrasound
and fluoroscopic image documentation was performed). The needle was
removed over an 018 wire which was exchanged for a Amplatz. This was
advanced into the IVC. An 8-French dilator was advanced over the
Amplatz.

A small incision was made in the right upper chest over the anterior
right second rib. Utilizing blunt dissection, a subcutaneous pocket
was created in the caudal direction. The pocket was irrigated with a
copious amount of sterile normal saline. The port catheter was
tunneled from the chest incision, and out the neck incision. The
reservoir was inserted into the subcutaneous pocket and secured with
two 3-0 Ethilon stitches. A peel-away sheath was advanced over the
Amplatz wire. The port catheter was cut to measure length and
inserted through the peel-away sheath. The peel-away sheath was
removed. The chest incision was closed with 3-0 Vicryl interrupted
stitches for the subcutaneous tissue and a running of 4-0 Vicryl
subcuticular stitch for the skin. The neck incision was closed with
a 4-0 Vicryl subcuticular stitch. Derma-bond was applied to both
surgical incisions. The port reservoir was flushed and instilled
with heparinized saline. No complications.
FINDINGS: A right IJ vein Port-A-Cath is in place with its tip at the
cavoatrial junction.

COMPLICATIONS:
None
IMPRESSION: Successful 8 French right internal jugular vein power port placement
with its tip at the SVC/RA junction.

## 2018-11-24 IMAGING — US IR FLUORO GUIDE CV LINE*L*
1 series · 1 of 1 positions shown · non-contrast
Comparison: none

CLINICAL DATA: Colon carcinoma

[Series 1: ir (id) (id)/(id)/(id) ir · 1 of 1 slices shown]
[im 1/1]
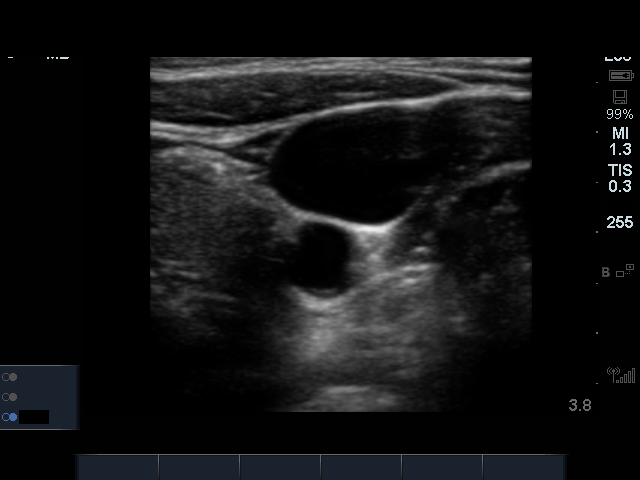

[1 of 1 positions shown; findings below may reference images not displayed]

EXAM:
TUNNEL POWER PORT PLACEMENT WITH SUBCUTANEOUS POCKET UTILIZING
ULTRASOUND & FLOUROSCOPY

FLUOROSCOPY TIME:  18 seconds.  Five mGy.

MEDICATIONS AND MEDICAL HISTORY:
Versed 4 mg, Fentanyl 100 mcg.

Additional Medications: Ancef 2 g. Antibiotics were given within 2
hours of the procedure.

ANESTHESIA/SEDATION:
Moderate sedation time: 33 minutes. Nursing monitored the the
patient during the procedure.

PROCEDURE:
After written informed consent was obtained, patient was placed in
the supine position on angiographic table. The right neck and chest
was prepped and draped in a sterile fashion. Lidocaine was utilized
for local anesthesia. The right jugular vein was noted to be patent
initially with ultrasound. Under sonographic guidance, a
micropuncture needle was inserted into the right IJ vein (Ultrasound
and fluoroscopic image documentation was performed). The needle was
removed over an 018 wire which was exchanged for a Amplatz. This was
advanced into the IVC. An 8-French dilator was advanced over the
Amplatz.

A small incision was made in the right upper chest over the anterior
right second rib. Utilizing blunt dissection, a subcutaneous pocket
was created in the caudal direction. The pocket was irrigated with a
copious amount of sterile normal saline. The port catheter was
tunneled from the chest incision, and out the neck incision. The
reservoir was inserted into the subcutaneous pocket and secured with
two 3-0 Ethilon stitches. A peel-away sheath was advanced over the
Amplatz wire. The port catheter was cut to measure length and
inserted through the peel-away sheath. The peel-away sheath was
removed. The chest incision was closed with 3-0 Vicryl interrupted
stitches for the subcutaneous tissue and a running of 4-0 Vicryl
subcuticular stitch for the skin. The neck incision was closed with
a 4-0 Vicryl subcuticular stitch. Derma-bond was applied to both
surgical incisions. The port reservoir was flushed and instilled
with heparinized saline. No complications.
FINDINGS: A right IJ vein Port-A-Cath is in place with its tip at the
cavoatrial junction.

COMPLICATIONS:
None
IMPRESSION: Successful 8 French right internal jugular vein power port placement
with its tip at the SVC/RA junction.

## 2018-11-24 MED ORDER — MIDAZOLAM HCL 2 MG/2ML IJ SOLN
INTRAMUSCULAR | Status: AC | PRN
  Administered 2018-11-24 (×4): 1 mg via INTRAVENOUS

## 2018-11-24 MED ORDER — MIDAZOLAM HCL 2 MG/2ML IJ SOLN
INTRAMUSCULAR | Status: AC
  Filled 2018-11-24: qty 2

## 2018-11-24 MED ORDER — FENTANYL CITRATE (PF) 100 MCG/2ML IJ SOLN
INTRAMUSCULAR | Status: AC | PRN
  Administered 2018-11-24 (×2): 50 ug via INTRAVENOUS

## 2018-11-24 MED ORDER — FENTANYL CITRATE (PF) 100 MCG/2ML IJ SOLN
INTRAMUSCULAR | Status: AC
  Filled 2018-11-24: qty 2

## 2018-11-24 MED ORDER — CEFAZOLIN SODIUM-DEXTROSE 2-4 GM/100ML-% IV SOLN
INTRAVENOUS | Status: AC
  Administered 2018-11-24: 2 g via INTRAVENOUS
  Filled 2018-11-24: qty 100

## 2018-11-24 MED ORDER — LIDOCAINE HCL 1 % IJ SOLN
INTRAMUSCULAR | Status: AC
  Filled 2018-11-24: qty 20

## 2018-11-24 MED ORDER — CEFAZOLIN SODIUM-DEXTROSE 2-4 GM/100ML-% IV SOLN
2.0000 g | INTRAVENOUS | Status: AC
  Administered 2018-11-24: 2 g via INTRAVENOUS

## 2018-11-24 MED ORDER — HEPARIN SOD (PORK) LOCK FLUSH 100 UNIT/ML IV SOLN
INTRAVENOUS | Status: AC
  Filled 2018-11-24: qty 5

## 2018-11-24 MED ORDER — SODIUM CHLORIDE 0.9 % IV SOLN
INTRAVENOUS | Status: DC
  Administered 2018-11-24: 12:00:00 via INTRAVENOUS

## 2018-11-24 MED ORDER — LIDOCAINE HCL (PF) 1 % IJ SOLN
INTRAMUSCULAR | Status: AC | PRN
  Administered 2018-11-24 (×2): 10 mL via SUBCUTANEOUS

## 2018-11-24 NOTE — Procedures (Signed)
RIJV PAC SVC RA EBL 0 Comp 0 

## 2018-11-24 NOTE — H&P (Signed)
Referring Physician(s): Feng,Yan  Supervising Physician: Marybelle Killings  Patient Status:  [facility] OP  Chief Complaint:  "I'm here for a chest catheter"  Subjective: Patient familiar to IR service from prior retroperitoneal lymph node biopsy in 2010.  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 He is scheduled today for Port-A-Cath placement for chemotherapy.  He denies fever, headache, chest pain, dyspnea, cough, back pain, nausea, vomiting or bleeding.  He does have some occasional shoulder and abdominal discomfort.  Past Medical History:  Diagnosis Date  . Appendicitis 10/21/2018  . Diabetes mellitus   . Follicular lymphoma ([place]), 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   Qualifier: Diagnosis of  By: [name], [name]   . Hypercholesterolemia   . Hypertension 11/27/2011  . Lymphoma [facility])    gets annual chemo last tx Feb 2013  . TOBACCO USE, QUIT 08/18/2009   Qualifier: Diagnosis of  By: Drue Flirt  MD, Merrily Brittle     Past Surgical History:  Procedure Laterality Date  . ESOPHAGOGASTRODUODENOSCOPY (EGD) WITH PROPOFOL N/A 11/05/2018   Procedure: ESOPHAGOGASTRODUODENOSCOPY (EGD) WITH PROPOFOL;  Surgeon: [name], MD;  Location: [facility];  Service: Endoscopy;  Laterality: N/A;  . LAPAROSCOPIC APPENDECTOMY N/A 10/21/2018   Procedure: Exploratory laparotomy right hemicolectomy;  Surgeon: [name], MD;  Location: [facility] OR;  Service: General;  Laterality: N/A;       Allergies: Iohexol  Medications: Prior to Admission medications   Medication Sig Start Date End Date Taking? Authorizing Provider  Alcohol Swabs PADS Use as instructed 09/05/16  Yes Schorr, [name], NP  atorvastatin (LIPITOR) 10 MG tablet Take 1 tablet (10 mg total) by mouth daily. 07/16/17  Yes Enid Derry, [name], DO  Blood Glucose Monitoring Suppl (RELION PRIME MONITOR) DEVI Use device as  instructed 04/14/16  Yes Luiz Blare Y, DO  feeding supplement (ENSURE SURGERY) LIQD Take 237 mLs by mouth 2 (two) times daily between meals. 10/28/18  Yes Meuth, [name], [name]  glucose blood test strip Use as instructed 04/14/16  Yes Luiz Blare Y, DO  metFORMIN (GLUCOPHAGE) 1000 MG tablet Take 1 tablet (1,000 mg total) by mouth 2 (two) times daily with a meal. 07/16/17  Yes Enid Derry, [name], DO  metoprolol tartrate (LOPRESSOR) 25 MG tablet Take 0.5 tablets (12.5 mg total) by mouth 2 (two) times daily. 10/28/18  Yes Meuth, [name], [name]  docusate sodium (COLACE) 100 MG capsule Take 1 capsule (100 mg total) by mouth 2 (two) times daily. 10/28/18   Meuth, Blaine Hamper, [name]  lidocaine-prilocaine (EMLA) cream Apply to affected area once 11/13/18   [name], MD  ondansetron (ZOFRAN) 8 MG tablet Take 1 tablet (8 mg total) by mouth 2 (two) times daily as needed for refractory nausea / vomiting. Start on day 3 after chemotherapy. 11/13/18   [name], MD  polyethylene glycol [facility] Floria Raveling) packet Take 17 g by mouth daily. 10/28/18   Meuth, [name], [name]  prochlorperazine (COMPAZINE) 10 MG tablet Take 1 tablet (10 mg total) by mouth every 6 (six) hours as needed (Nausea or vomiting). 11/13/18   [name], MD  RELION LANCETS STANDARD 21G MISC Use as instructed 04/14/16   [name], DO  saccharomyces boulardii (FLORASTOR) 250 MG capsule Take 1 capsule (250 mg total) by mouth 2 (two) times daily. 10/28/18   Meuth, [name], [name]     Vital Signs: BP 128/72  Pulse 73   Temp 98.3 F (36.8 C)   Resp 16   Ht 5\' 5"  (1.651 m)   Wt 184 lb (83.5 kg)   SpO2 98%   BMI 30.62 kg/m   Physical Exam awake, alert.  Chest clear to auscultation bilaterally.  Heart with regular rate and rhythm.  Abdomen soft, positive bowel sounds, mild lower central abdominal tenderness to palpation; no lower extremity edema  Imaging: No results found.  Labs:  CBC: Recent Labs    11/06/18 1853 11/07/18 0448  11/08/18 0225 11/24/18 1200  WBC 6.4 5.6 6.6 10.8*  HGB 8.4* 7.8* 8.0* 11.7*  HCT 24.9* 24.2* 24.9* 35.6*  PLT 313 327 329 213    COAGS: Recent Labs    11/05/18 0303 11/24/18 1200  INR 1.08 0.80  APTT 27  --     BMP: Recent Labs    11/05/18 0303 11/05/18 1259 11/06/18 0400 11/07/18 0448 11/08/18 0225  NA 133*  --  138 138 137  K 5.9* 4.0 3.8 3.3* 3.8  CL 107  --  107 107 105  CO2 23  --  22 23 24   GLUCOSE 200*  --  137* 142* 179*  BUN 32*  --  13 5* 7  CALCIUM 7.7*  --  8.5* 8.1* 8.7*  CREATININE 1.03  --  0.83 0.65 0.76  GFRNONAA >60  --  >60 >60 >60  GFRAA >60  --  >60 >60 >60    LIVER FUNCTION TESTS: Recent Labs    10/21/18 1659 11/05/18 0303  BILITOT 0.5 0.3  AST 12* 14*  ALT 12 16  ALKPHOS 63 49  PROT 5.7* 4.3*  ALBUMIN 2.9* 2.5*    Assessment and Plan: Pt with history of follicular lymphoma in 5400 with prior chemotherapy ; now presents with newly diagnosed right colon cancer, status post right hemicolectomy. He is scheduled today for Port-A-Cath placement for chemotherapy. Risks and benefits of image guided port-a-catheter placement was discussed with the patient via interpreter including, but not limited to bleeding, infection, pneumothorax, or fibrin sheath development and need for additional procedures.  All of the patient's questions were answered, patient is agreeable to proceed. Consent signed and in chart.     Electronically Signed: D. Rowe Robert, [name] 11/24/2018, 1:15 PM   I spent a total of 25 minutes at the the patient's bedside AND on the patient's hospital floor or unit, greater than 50% of which was counseling/coordinating care for port a  cath placement

## 2018-11-24 NOTE — Discharge Instructions (Signed)
Gua de cuidados en el hogar del dispositivo de perfusin implantable Implanted [place] Guide Un dispositivo de perfusin implantable es un dispositivo que se coloca debajo de la piel. Por lo general, se coloca en el pecho. Puede usarse para suministrar medicamentos intravenosos, tomar muestras de sangre o realizar dilisis. Puede tener un dispositivo implantable si:  Necesita administrarse un medicamento intravenoso que podra provocar una irritacin en las venas pequeas de las manos o los brazos.  Necesita medicamentos por va intravenosa a largo plazo, como antibiticos.  Necesita recibir nutricin por va intravenosa durante un largo perodo.  Debe someterse a dilisis. Si tiene un dispositivo, su mdico no tendr que recurrir a las venas de sus brazos para estos procedimientos. Puede tener algunas limitaciones ms al usar el dispositivo que si usara otro tipo de vas intravenosas a largo plazo, y es probable que pueda retomar sus actividades normales cuando cure la incisin. Un dispositivo de perfusin implantable tiene dos partes principales:  Depsito. El depsito es la parte en donde se inserta la aguja para administrar los medicamentos o extraer sangre. El depsito es redondo. Luego de colocado, se ve como un rea pequea y elevada debajo de su piel.  Catter. El catter es un tubo delgado y flexible que conecta el depsito a una vena. Los medicamentos que se introducen en el depsito, pasan a travs del catter y luego llegan a la vena. Cmo se accede al dispositivo de perfusin? Para acceder al dispositivo:  Puede que se le coloque crema anestsica sobre la piel encima del dispositivo.  El mdico se colocar una mscara y guantes estriles.  La piel sobre el dispositivo se limpiar con cuidado con un jabn antisptico y se dejar secar.  Su mdico pellizcar suavemente el dispositivo e insertar una aguja dentro.  Su mdico revisar el retorno de sangre para garantizar que  el dispositivo est en la vena y no est obstruido.  Si el dispositivo debe tener accesibilidad para un suministro continuo de medicamentos (infusin constante), su mdico colocar un vendaje (venda) claro sobre el lugar donde se coloc la aguja. El vendaje y la aguja debern cambiarse todas las semanas o segn las indicaciones del mdico. Qu es el purgado? El purgado ayuda a que el dispositivo no se obstruya. Siga las indicaciones del mdico acerca de cmo y cundo purgar el dispositivo. Los dispositivos de perfusin generalmente se purgan con una solucin salina o un medicamento llamado heparina. La necesidad de purgar depender de cmo se use el dispositivo:  Si se usa solo eventualmente para suministrar medicamentos o extraer sangre, se debe purgar el dispositivo: ? Antes y despus de la administracin de los medicamentos. ? Antes y despus de una extraccin de sangre. ? Como parte de una rutina de mantenimiento. Es recomendable que se purgue cada 4 o 6 semanas.  Si la infusin es constante, no necesitar limpiarlo.  Deseche las agujas y las jeringas en un contenedor para desechos destinado a objetos punzantes(recipiente para objetos punzantes). Puede comprar un recipiente para objetos punzantes en una farmacia, o puede hacer uno usted mismo con una botella de plstico rgida y un cobertor. Durante cunto tiempo tendr implantado el dispositivo? El dispositivo puede permanecer implantado por el tiempo que el mdico considere necesario. Cuando llega el momento de retirar el dispositivo, deber someterse a una ciruga. Esta ciruga ser similar a la realizada en el momento de la colocacin del dispositivo. Siga estas indicaciones en su casa:   Purgue el dispositivo como se lo haya indicado el   mdico.  Si debe recibir una infusin [place] indicaciones del mdico acerca de cmo cuidar el lugar donde tiene colocado el dispositivo. Haga lo siguiente: ? Lvese las manos  con agua y [place] antes de cambiar el apsito. Use desinfectante para manos con alcohol si no dispone de [place] y [place]. ? Cambie el vendaje como se lo haya indicado el mdico. ? Coloque vendas usadas o bolsas de infusin en una bolsa plstica. Tire esa bolsa en el contenedor de basura. ? Mantenga el vendaje que cubre la aguja limpio y seco. No deje que se moje. ? No use tijeras u objetos punzantes cerca del tubo. ? Mantenga el tubo sujetado, excepto cuando se use.  Controle TEFL teacher donde tiene colocado el dispositivo todos los das para detectar signos de infeccin. Est atento a los siguientes signos: ? Dolor, hinchazn o enrojecimiento. ? Lquido o sangre. ? Pus o mal olor.  Proteja la piel alrededor del lugar donde tiene colocado el dispositivo. ? Evite usar sostenes si los breteles rozan o Loss adjuster, chartered. ? Proteja la piel alrededor del dispositivo cuando se coloque el cinturn de seguridad. Coloque una almohadilla suave en el pecho, de ser necesario.  Dchese o bese como se lo haya indicado el mdico. Se puede mojar el lugar siempre y cuando no est recibiendo activamente una infusin.  Retome sus actividades normales como se lo haya indicado el mdico. Pregntele al mdico qu actividades son seguras para usted.  Utilice un brazalete o lleve consigo una tarjeta de alerta mdica en todo momento. Esto les permitir a los mdicos saber que tiene un dispositivo implantable en caso de Engineer, maintenance (IT). Solicite ayuda de inmediato si:  Tiene enrojecimiento, hinchazn o Management consultant donde tiene el dispositivo.  Le sale una mayor cantidad de lquido o de sangre del lugar donde tiene el dispositivo.  Tiene pus o percibe mal olor que proviene del lugar donde tiene colocado el dispositivo.  Tiene fiebre. Resumen  Por lo general, los dispositivos implantados se colocan en el pecho para un acceso intravenoso a Barrister's clerk.  Siga las indicaciones del mdico sobre cmo purgar el  dispositivo y [place] vendas (vendajes).  Cuide el rea alrededor de donde tiene colocado el dispositivo, evite la ropa que presione el rea, y preste atencin a los signos de infeccin.  Proteja la piel alrededor del dispositivo cuando se coloque el cinturn de seguridad. Coloque una almohadilla suave en el pecho, de ser necesario.  Busque ayuda de inmediato si tiene fiebre o enrojecimiento, hinchazn, dolor, drenaje o mal Editor, commissioning donde tiene el dispositivo. Esta informacin no tiene Marine scientist el consejo del mdico. Asegrese de hacerle al mdico cualquier pregunta que tenga. Document Released: 09/21/2009 Document Revised: 08/27/2017 Document Reviewed: 08/27/2017 Elsevier Interactive Patient Education  2019 [place] consciente moderada en los adultos, cuidados posteriores (Moderate Conscious Sedation, Adult, Care After) Estas indicaciones le proporcionan informacin acerca de cmo deber cuidarse despus del procedimiento. El mdico tambin podr darle instrucciones ms especficas. El tratamiento ha sido planificado segn las prcticas mdicas actuales, pero en algunos casos pueden ocurrir problemas. Comunquese con el mdico si tiene algn problema o dudas despus del procedimiento. QU ESPERAR DESPUS DEL PROCEDIMIENTO Despus del procedimiento, es comn:  Sentirse somnoliento durante varias horas.  Sentirse torpe y [employer] de equilibrio durante varias horas.  Perder el sentido de la realidad durante varias horas.  Vomitar si come [employer]. INSTRUCCIONES PARA EL CUIDADO EN EL  HOGAR  Durante al menos 24horas despus del procedimiento:  No haga lo siguiente: ? Participar en actividades que impliquen posibles cadas o lesiones. ? Conducir vehculos. ? Operar maquinarias pesadas. ? Beber alcohol. ? Tomar somnferos o medicamentos que causen somnolencia. ? Firmar documentos legales ni tomar [employer]. ? Cuidar a nios por  su cuenta.  Hacer reposo. Comida y bebida  Siga la dieta recomendada por el mdico.  Si vomita: ? Pruebe agua, jugo o sopa cuando usted pueda beber sin vomitar. ? Asegrese de no tener nuseas antes de ingerir alimentos slidos. Instrucciones generales  Permanezca con un adulto responsable hasta que est completamente despierto y consciente.  Tome los medicamentos de venta libre y los recetados solamente como se lo haya indicado el mdico.  Si fuma, no lo haga sin supervisin.  Concurra a todas las visitas de control como se lo haya indicado el mdico. Esto es importante. SOLICITE ATENCIN MDICA SI:  Sigue teniendo nuseas o vomitando.  Tiene sensacin de desvanecimiento.  Le aparece una erupcin cutnea.  Tiene fiebre. SOLICITE ATENCIN MDICA DE INMEDIATO SI:  Tiene dificultad para respirar. Esta informacin no tiene Marine scientist el consejo del mdico. Asegrese de hacerle al mdico cualquier pregunta que tenga. Document Released: 11/29/2013 Document Revised: 12/15/2014 Document Reviewed: 03/15/2016 Elsevier Interactive Patient Education  2019 [employer].

## 2018-11-25 ENCOUNTER — Inpatient Hospital Stay: Payer: Medicaid Other

## 2018-11-25 ENCOUNTER — Inpatient Hospital Stay (HOSPITAL_BASED_OUTPATIENT_CLINIC_OR_DEPARTMENT_OTHER): Payer: Medicaid Other | Admitting: Hematology

## 2018-11-25 ENCOUNTER — Encounter: Payer: Self-pay | Admitting: Hematology

## 2018-11-25 ENCOUNTER — Other Ambulatory Visit: Payer: Self-pay

## 2018-11-25 ENCOUNTER — Ambulatory Visit: Payer: Self-pay | Admitting: Family Medicine

## 2018-11-25 ENCOUNTER — Telehealth: Payer: Self-pay | Admitting: Hematology

## 2018-11-25 VITALS — BP 128/75 | HR 76 | Temp 97.8°F | Resp 18

## 2018-11-25 DIAGNOSIS — Z79899 Other long term (current) drug therapy: Secondary | ICD-10-CM | POA: Diagnosis not present

## 2018-11-25 DIAGNOSIS — Z8572 Personal history of non-Hodgkin lymphomas: Secondary | ICD-10-CM

## 2018-11-25 DIAGNOSIS — E119 Type 2 diabetes mellitus without complications: Secondary | ICD-10-CM | POA: Diagnosis not present

## 2018-11-25 DIAGNOSIS — D500 Iron deficiency anemia secondary to blood loss (chronic): Secondary | ICD-10-CM

## 2018-11-25 DIAGNOSIS — C182 Malignant neoplasm of ascending colon: Secondary | ICD-10-CM

## 2018-11-25 DIAGNOSIS — E7800 Pure hypercholesterolemia, unspecified: Secondary | ICD-10-CM

## 2018-11-25 DIAGNOSIS — Z95828 Presence of other vascular implants and grafts: Secondary | ICD-10-CM

## 2018-11-25 DIAGNOSIS — K5900 Constipation, unspecified: Secondary | ICD-10-CM

## 2018-11-25 DIAGNOSIS — C8200 Follicular lymphoma grade I, unspecified site: Secondary | ICD-10-CM

## 2018-11-25 DIAGNOSIS — Z5111 Encounter for antineoplastic chemotherapy: Secondary | ICD-10-CM | POA: Diagnosis not present

## 2018-11-25 DIAGNOSIS — C180 Malignant neoplasm of cecum: Secondary | ICD-10-CM

## 2018-11-25 DIAGNOSIS — C786 Secondary malignant neoplasm of retroperitoneum and peritoneum: Secondary | ICD-10-CM | POA: Diagnosis not present

## 2018-11-25 DIAGNOSIS — E1165 Type 2 diabetes mellitus with hyperglycemia: Secondary | ICD-10-CM

## 2018-11-25 LAB — IRON AND TIBC
IRON: 42 ug/dL (ref 42–163)
Saturation Ratios: 11 % — ABNORMAL LOW (ref 20–55)
TIBC: 391 ug/dL (ref 202–409)
UIBC: 349 ug/dL (ref 117–376)

## 2018-11-25 LAB — CBC WITH DIFFERENTIAL (CANCER CENTER ONLY)
Abs Immature Granulocytes: 0.03 10*3/uL (ref 0.00–0.07)
Basophils Absolute: 0 10*3/uL (ref 0.0–0.1)
Basophils Relative: 0 %
Eosinophils Absolute: 0.4 10*3/uL (ref 0.0–0.5)
Eosinophils Relative: 4 %
HCT: 31.7 % — ABNORMAL LOW (ref 39.0–52.0)
Hemoglobin: 10.5 g/dL — ABNORMAL LOW (ref 13.0–17.0)
Immature Granulocytes: 0 %
Lymphocytes Relative: 22 %
Lymphs Abs: 1.9 10*3/uL (ref 0.7–4.0)
MCH: 28.5 pg (ref 26.0–34.0)
MCHC: 33.1 g/dL (ref 30.0–36.0)
MCV: 85.9 fL (ref 80.0–100.0)
Monocytes Absolute: 0.7 10*3/uL (ref 0.1–1.0)
Monocytes Relative: 8 %
Neutro Abs: 5.6 10*3/uL (ref 1.7–7.7)
Neutrophils Relative %: 66 %
Platelet Count: 186 10*3/uL (ref 150–400)
RBC: 3.69 MIL/uL — ABNORMAL LOW (ref 4.22–5.81)
RDW: 13.6 % (ref 11.5–15.5)
WBC Count: 8.7 10*3/uL (ref 4.0–10.5)
nRBC: 0 % (ref 0.0–0.2)

## 2018-11-25 LAB — CMP (CANCER CENTER ONLY)
ALT: 13 U/L (ref 0–44)
AST: 9 U/L — ABNORMAL LOW (ref 15–41)
Albumin: 3.5 g/dL (ref 3.5–5.0)
Alkaline Phosphatase: 96 U/L (ref 38–126)
Anion gap: 9 (ref 5–15)
BUN: 18 mg/dL (ref 6–20)
CO2: 23 mmol/L (ref 22–32)
Calcium: 8.9 mg/dL (ref 8.9–10.3)
Chloride: 103 mmol/L (ref 98–111)
Creatinine: 1.02 mg/dL (ref 0.61–1.24)
GFR, Est AFR Am: >60 mL/min (ref >60)
GFR, Estimated: >60 mL/min (ref >60)
Glucose, Bld: 345 mg/dL — ABNORMAL HIGH (ref 70–99)
Potassium: 5 mmol/L (ref 3.5–5.1)
Sodium: 135 mmol/L (ref 135–145)
Total Bilirubin: 0.3 mg/dL (ref 0.3–1.2)
Total Protein: 6 g/dL — ABNORMAL LOW (ref 6.5–8.1)

## 2018-11-25 LAB — FERRITIN: Ferritin: 21 ng/mL — ABNORMAL LOW (ref 24–336)

## 2018-11-25 LAB — CEA (IN HOUSE-CHCC): CEA (CHCC-In House): 4.09 ng/mL (ref 0.00–5.00)

## 2018-11-25 MED ORDER — SODIUM CHLORIDE 0.9% FLUSH
10.0000 mL | INTRAVENOUS | Status: DC | PRN
  Filled 2018-11-25: qty 10

## 2018-11-25 MED ORDER — ONDANSETRON HCL 8 MG PO TABS: 8.0000 mg | ORAL_TABLET | Freq: Two times a day (BID) | ORAL | 1 refills | Status: DC | PRN

## 2018-11-25 MED ORDER — DEXAMETHASONE SODIUM PHOSPHATE 10 MG/ML IJ SOLN
INTRAMUSCULAR | Status: AC
  Filled 2018-11-25: qty 1

## 2018-11-25 MED ORDER — OXALIPLATIN CHEMO INJECTION 100 MG/20ML
85.0000 mg/m2 | Freq: Once | INTRAVENOUS | Status: AC
  Administered 2018-11-25: 165 mg via INTRAVENOUS
  Filled 2018-11-25: qty 33

## 2018-11-25 MED ORDER — LIDOCAINE-PRILOCAINE 2.5-2.5 % EX CREA: TOPICAL_CREAM | CUTANEOUS | 3 refills | Status: DC

## 2018-11-25 MED ORDER — FLUOROURACIL CHEMO INJECTION 2.5 GM/50ML
400.0000 mg/m2 | Freq: Once | INTRAVENOUS | Status: AC
  Administered 2018-11-25: 800 mg via INTRAVENOUS
  Filled 2018-11-25: qty 16

## 2018-11-25 MED ORDER — PALONOSETRON HCL INJECTION 0.25 MG/5ML
INTRAVENOUS | Status: AC
  Filled 2018-11-25: qty 5

## 2018-11-25 MED ORDER — PALONOSETRON HCL INJECTION 0.25 MG/5ML
0.2500 mg | Freq: Once | INTRAVENOUS | Status: AC
Start: 2018-11-25 — End: 2018-11-25
  Administered 2018-11-25: 0.25 mg via INTRAVENOUS

## 2018-11-25 MED ORDER — DEXAMETHASONE SODIUM PHOSPHATE 10 MG/ML IJ SOLN
10.0000 mg | Freq: Once | INTRAMUSCULAR | Status: AC
  Administered 2018-11-25: 10 mg via INTRAVENOUS

## 2018-11-25 MED ORDER — SODIUM CHLORIDE 0.9 % IV SOLN
2400.0000 mg/m2 | INTRAVENOUS | Status: DC
  Administered 2018-11-25: 4750 mg via INTRAVENOUS
  Filled 2018-11-25: qty 95

## 2018-11-25 MED ORDER — LEUCOVORIN CALCIUM INJECTION 350 MG
400.0000 mg/m2 | Freq: Once | INTRAVENOUS | Status: AC
  Administered 2018-11-25: 788 mg via INTRAVENOUS
  Filled 2018-11-25: qty 39.4

## 2018-11-25 MED ORDER — DEXTROSE 5 % IV SOLN
Freq: Once | INTRAVENOUS | Status: AC
  Administered 2018-11-25: 13:00:00 via INTRAVENOUS
  Filled 2018-11-25: qty 250

## 2018-11-25 MED ORDER — SODIUM CHLORIDE 0.9% FLUSH
10.0000 mL | INTRAVENOUS | Status: DC | PRN
  Administered 2018-11-25: 10 mL
  Filled 2018-11-25: qty 10

## 2018-11-25 MED ORDER — PROCHLORPERAZINE MALEATE 10 MG PO TABS: 10.0000 mg | ORAL_TABLET | Freq: Four times a day (QID) | ORAL | 1 refills | Status: DC | PRN

## 2018-11-25 MED FILL — LIDOCAINE-PRILOCAINE CREAM: 2.5-2.5 | 10 days supply | Qty: 30 | Fill #0 | Status: TO

## 2018-11-25 MED FILL — ONDANSETRON HCL 8 MG TABLET: 8 | 15 days supply | Qty: 30 | Fill #0 | Status: TO

## 2018-11-25 MED FILL — PROCHLORPERAZINE 10 MG TAB: 10 | 7 days supply | Qty: 30 | Fill #0 | Status: TO

## 2018-11-25 NOTE — Telephone Encounter (Signed)
Scheduled appt per 12/19 sch message - pt is aware of apt date and time - gave patient schedule in treatment area.

## 2018-11-25 NOTE — Patient Instructions (Addendum)
[facility] Discharge Instructions for Patients Receiving Chemotherapy  Today you received the following chemotherapy agents: oxaliplatin, leucovorin, 5-fluorouracil  To help prevent nausea and vomiting after your treatment, we encourage you to take your nausea medication as directed by your physician.   If you develop nausea and vomiting that is not controlled by your nausea medication, call the clinic.   BELOW ARE SYMPTOMS THAT SHOULD BE REPORTED IMMEDIATELY:  *FEVER GREATER THAN 100.5 F  *CHILLS WITH OR WITHOUT FEVER  NAUSEA AND VOMITING THAT IS NOT CONTROLLED WITH YOUR NAUSEA MEDICATION  *UNUSUAL SHORTNESS OF BREATH  *UNUSUAL BRUISING OR BLEEDING  TENDERNESS IN MOUTH AND THROAT WITH OR WITHOUT PRESENCE OF ULCERS  *URINARY PROBLEMS  *BOWEL PROBLEMS  UNUSUAL RASH Items with * indicate a potential emergency and should be followed up as soon as possible.  Feel free to call the clinic should you have any questions or concerns. The clinic phone number is [phone].  Please show the [place] at check-in to the Emergency Department and triage nurse.  Oxaliplatin Injection Qu es este medicamento? El OXALIPLATINO es un agente quimioteraputico. Este medicamento acta sobre las clulas que se dividen rpidamente, como las clulas cancerosas, y finalmente provoca la muerte de estas clulas. Se utiliza en el tratamiento del cncer de colon y recto y 65 tipos de cncer. Este medicamento puede ser utilizado para otros usos; si tiene alguna pregunta consulte con su proveedor de atencin mdica o con su farmacutico. MARCAS COMUNES: Eloxatin Qu le debo informar a mi profesional de la salud antes de tomar este medicamento? Necesita saber si usted presenta alguno de los siguientes problemas o situaciones: -enfermedad renal -una reaccin alrgica o inusual al oxaliplatino, a otros agentes quimioteraputicos, a otros medicamentos, alimentos, colorantes o  conservantes -si est embarazada o buscando quedar embarazada -si est amamantando a un beb Cmo debo utilizar este medicamento? Este medicamento se administra mediante infusin por va intravenosa. Lo administra un profesional de la salud calificado en un hospital o en un entorno clnico. Hable con su pediatra para informarse acerca del uso de este medicamento en nios. Puede requerir atencin especial. Sobredosis: Pngase en contacto inmediatamente con un centro toxicolgico o una sala de urgencia si usted cree que haya tomado demasiado medicamento. ATENCIN: [employer] es solo para usted. No comparta este medicamento con nadie. Qu sucede si me olvido de una dosis? Es importante no olvidar ninguna dosis. Informe a su mdico o a su profesional de la salud si no puede asistir a Photographer. Qu puede interactuar con este medicamento? -medicamentos para incrementar los conteos sanguneos, tales como filgrastim, pegfilgrastim, sargramostim -probenecid -ciertos antibiticos, tales como amicacina, gentamicina, neomicina, polimixina B, estreptomicina, tobramicina -zalcitabina Consulte a su mdico o a su profesional de la salud antes de tomar cualquiera de los siguientes medicamentos: -acetaminofeno -aspirina -ibuprofeno -quetoprofeno -naproxeno Puede ser que esta lista no menciona todas las posibles interacciones. Informe a su profesional de [employer]	[place] de [employer] productos a base de hierbas, medicamentos de [place] o suplementos nutritivos que est tomando. Si usted fuma, consume bebidas alcohlicas o si utiliza drogas ilegales, indqueselo tambin a su profesional de KB Home	[place]. Algunas sustancias pueden interactuar con su medicamento. A qu debo estar atento al usar [employer]? Se supervisar su condicin atentamente mientras reciba este medicamento. Tendr que hacerse anlisis de sangre peridicos mientras reciba este medicamento. Este medicamento puede aumentar su sensibilidad  al fro. No tomar bebidas fras o usar hielo. Cubra la piel expuesta antes  de estar en contacto con temperaturas fras u objetos fros. Mientras se encuentra afuera cuando haga fro use ropa [place] su boca y su nariz para calentar el aire que entra en sus pulmones. Informe a su mdico si experimenta sensibilidad al fro. Este medicamento puede hacerle sentir un Nurse, mental health. Esto es normal ya que la quimioterapia afecta tanto a las clulas sanas como a las clulas cancerosas. Si presenta alguno de los [employer], infrmelos. Sin embargo, contine con el tratamiento aun si se siente enfermo, a menos que su mdico le indique que lo suspenda. En algunos casos, podr recibir [employer] para ayudarle con los efectos secundarios. Siga las instrucciones para usarlos. Consulte a su mdico o a su profesional de la salud por asesoramiento si tiene fiebre, escalofros, dolor de garganta o cualquier otro sntoma de resfro o gripe. No se trate usted mismo. Este medicamento puede reducir la capacidad del cuerpo para combatir infecciones. Trate de no acercarse a personas que estn enfermas. [employer] puede aumentar el riesgo de magulladuras o sangrado. Consulte a su mdico o a su profesional de la salud si observa sangrados inusuales. Proceda con cuidado al cepillar sus dientes, usar hilo dental o Risk manager palillos para los dientes, ya que puede contraer una infeccin o Therapist, art con mayor facilidad. Si se somete a algn tratamiento dental, informe a su dentista que est [employer]. Evite tomar productos que contienen aspirina, acetaminofeno, ibuprofeno, naproxeno o quetoprofeno a menos que as lo indique su mdico. Estos productos pueden disimular la fiebre. No se debe quedar embarazada mientras reciba este medicamento. Las mujeres deben informar a su mdico si estn buscando quedar embarazadas o si creen que estn embarazadas. Existe la posibilidad de efectos  secundarios graves a un beb sin nacer. Para ms informacin hable con su profesional de la salud o su farmacutico. No debe Economist a un beb mientras reciba este medicamento. Si tiene diarrea, llame a su mdico o a su profesional de [employer]	[place]. No se trate usted mismo. Qu efectos secundarios puedo tener al [employer] este medicamento? Efectos secundarios que debe informar a su mdico o a Barrister's clerk de la salud tan pronto como sea posible: -reacciones alrgicas como erupcin cutnea, picazn o urticarias, hinchazn de la cara, labios o lengua -conteos sanguneos bajos - este medicamento puede reducir la cantidad de glbulos blancos, glbulos rojos y plaquetas. Su riesgo de infeccin y [place]. -signos de infeccin - fiebre o escalofros, tos, dolor de garganta, Social research officer, government o dificultad para orinar -signos de reduccin de plaquetas o sangrado - magulladuras, puntos rojos en la piel, heces de color oscuro o con aspecto alquitranado, sangrado por la nariz -signos de reduccin de glbulos rojos - cansancio o debilidad inusual, desmayos, sensacin de mareo -problemas respiratorios -dolor u opresin en el pecho -tos -diarrea -tensin en la mandbula -llagas en la boca -nuseas, vmito -dolor, hinchazn, enrojecimiento o irritacin en el lugar de la inyeccin -dolor, hormigueo, entumecimiento de manos o pies -problemas de coordinacin, del habla, al caminar -enrojecimiento, formacin de ampollas, descamacin o distensin de la piel, inclusive dentro de la boca -dificultad para orinar o cambios en el volumen de orina Efectos secundarios que, por lo general, no requieren atencin mdica (debe informarlos a su mdico o a su profesional de la salud si persisten o si son molestos): -cambios en la visin -estreimiento -cada del cabello -prdida del apetito -sabor metlico en la boca o cambios en el sentido del gusto -dolor estomacal Puede ser que [place]  lista no menciona todos los  posibles efectos secundarios. Comunquese a su mdico por asesoramiento mdico [employer] Usted puede informar los efectos secundarios a la FDA por telfono al 1-800-FDA-1088. Dnde debo guardar mi medicina? Este medicamento se administra en hospitales o clnicas y no necesitar guardarlo en su domicilio. ATENCIN: Este folleto es un resumen. Puede ser que no cubra toda la posible informacin. Si usted tiene preguntas acerca de esta medicina, consulte con su mdico, su farmacutico o su profesional de Technical sales engineer.  2019 Elsevier/Gold Standard (2015-01-16 00:00:00)  Leucovorin injection Qu es este medicamento? La LEUCOVORINA se [place] para prevenir o tratar [employer] nocivos de ciertos medicamentos. Este medicamento tambin sirve para tratar la anemia provocada por una nivel bajo de cido flico en el cuerpo. Tambin se puede administrar con 5-fluorouracilo (5-FU), para tratar el cncer de colon. Este medicamento puede ser utilizado para otros usos; si tiene alguna pregunta consulte con su proveedor de atencin mdica o con su farmacutico. Qu le debo informar a mi profesional de la salud antes de tomar este medicamento? Necesita saber si usted presenta alguno de los siguientes problemas o situaciones: -anemia debido a bajos niveles de vitamina B-12 en la sangre -una reaccin alrgica o inusual a la leucovorina, cido flico, a otros medicamentos, alimentos, colorantes o conservantes -si est embarazada o buscando quedar embarazada -si est amamantando a un beb Cmo debo utilizar este medicamento? Este medicamento se administra mediante inyeccin por va intramuscular o intravenosa. Lo administra un profesional de Technical sales engineer en un hospital o en un entorno clnico. Hable con su pediatra para informarse acerca del uso de este medicamento en nios. Puede requerir atencin especial. Sobredosis: Pngase en contacto inmediatamente con un centro toxicolgico o una sala de  urgencia si usted cree que haya tomado demasiado medicamento. ATENCIN: [employer] es solo para usted. No comparta este medicamento con nadie. Qu sucede si me olvido de una dosis? No se aplica en este caso. Qu puede interactuar con este medicamento? -capecitabina -fluorouracilo -fenobarbital -fenitona -primidona -trimetoprima- sulfametoxasol Puede ser que esta lista no menciona todas las posibles interacciones. Informe a su profesional de KB Home	[place] de [employer] productos a base de hierbas, medicamentos de [place] o suplementos nutritivos que est tomando. Si usted fuma, consume bebidas alcohlicas o si utiliza drogas ilegales, indqueselo tambin a su profesional de KB Home	[place]. Algunas sustancias pueden interactuar con su medicamento. A qu debo estar atento al usar [employer]? Se supervisar su estado de salud atentamente mientras reciba este medicamento. Este medicamento puede aumentar los efectos secundarios de 5-fluorouracilo, 5-FU. Si tiene diarrea o [employer] boca que no mejoran o que [place], consulte a su mdico o a su profesional de KB Home	[place]. Qu efectos secundarios puedo tener al [employer] este medicamento? Efectos secundarios que debe informar a su mdico o a Barrister's clerk de la salud tan pronto como sea posible: -Chief of Staff como erupcin cutnea, picazn o urticarias, hinchazn de la cara, labios o lengua -problemas respiratorios -fiebre, infeccin -llagas en la boca -sangrado, magulladuras inusuales -cansancio o debilidad inusual Efectos secundarios que, por lo general, no requieren atencin mdica (debe informarlos a su mdico o a su profesional de la salud si persisten o si son molestos): -estreimiento o diarrea -prdida del apetito -nuseas, vmito Puede ser que [employer] no menciona todos los posibles efectos secundarios. Comunquese a su mdico por asesoramiento mdico [employer] Usted puede informar los efectos  secundarios a la FDA por telfono al 1-800-FDA-1088. Dnde  debo guardar mi medicina? Este medicamento se administra en hospitales o clnicas y no necesitar guardarlo en su domicilio. ATENCIN: Este folleto es un resumen. Puede ser que no cubra toda la posible informacin. Si usted tiene preguntas acerca de esta medicina, consulte con su mdico, su farmacutico o su profesional de Technical sales engineer.  2019 Elsevier/Gold Standard (2015-01-16 00:00:00)  Fluorouracil, 5-FU injection Qu es este medicamento? El [place], 5-FU es un agente quimioteraputico. Este medicamento reduce el crecimiento de las clulas cancerosas. Se utiliza en el tratamiento de muchos tipos de cncer, incluyendo el cncer de mama, de colon y recto, pancretico y de Paramedic. Este medicamento puede ser utilizado para otros usos; si tiene alguna pregunta consulte con su proveedor de atencin mdica o con su farmacutico. MARCAS COMUNES: [place] Qu le debo informar a mi profesional de la salud antes de tomar este medicamento? Necesita saber si usted presenta alguno de los siguientes problemas o situaciones: -trastornos sanguneos -deficiencia de la dihidropirimidina deshidrogenasa (DPD) -infeccin (especialmente infecciones virales, como varicela o herpes) -enfermedad renal -enfermedad heptica -desnutrido, malnutricin -radioterapia reciente o continuada -una reaccin alrgica o inusual al fluorouracilo, a otros agentes quimioteraputicos, otros medicamentos, alimentos, colorantes o conservantes -si est embarazada o buscando quedar embarazada -si est amamantando a un beb Cmo debo utilizar este medicamento? Este medicamento se administra mediante inyeccin o infusin por va intravenosa. Lo administra un profesional de la salud calificado en un hospital o en un entorno clnico. Hable con su pediatra para informarse acerca del uso de este medicamento en nios. Puede requerir atencin especial. Sobredosis: Pngase en  contacto inmediatamente con un centro toxicolgico o una sala de urgencia si usted cree que haya tomado demasiado medicamento. ATENCIN: [employer] es solo para usted. No comparta este medicamento con nadie. Qu sucede si me olvido de una dosis? Es importante no olvidar ninguna dosis. Informe a su mdico o a su profesional de la salud si no puede asistir a Photographer. Qu puede interactuar con este medicamento? -alopurinol -cimetidina -dapsona -digoxina -hidroxiurea -leucovorina -levamisol -medicamentos para convulsiones, tales como etotona, fosfenitona, fenitona -medicamentos para incrementar los conteos sanguneos, tales como filgrastim, pegfilgrastim, sargramostim -medicamentos que tratan o previenen cogulos sanguneos, tales como warfarina, enoxaparina y dalteparina -metotrexato -metronidazol -pirimetamina -otros agentes quimioteraputicos, tales como busulfn, cisplatino, estramustina, vinblastina -trimetoprima -trimetrexato -vacunas Consulte a su mdico o a su profesional de la salud antes de tomar cualquiera de los siguientes medicamentos: -acetaminofeno -aspirina -ibuprofeno -quetoprofeno -naproxeno Puede ser que esta lista no menciona todas las posibles interacciones. Informe a su profesional de KB Home	[place] de [employer] productos a base de hierbas, medicamentos de [place] o suplementos nutritivos que est tomando. Si usted fuma, consume bebidas alcohlicas o si utiliza drogas ilegales, indqueselo tambin a su profesional de KB Home	[place]. Algunas sustancias pueden interactuar con su medicamento. A qu debo estar atento al usar [employer]? Visite a su mdico para chequear su evolucin peridicamente. Este medicamento puede hacerle sentir un Nurse, mental health. Esto es normal ya que la quimioterapia afecta tanto a las clulas sanas como a las clulas cancerosas. Si presenta alguno de los [employer], infrmelos. Sin embargo, contine con el tratamiento aun si  se siente enfermo, a menos que su mdico le indique que lo suspenda. En algunos casos, podr recibir [employer] para ayudarle con los efectos secundarios. Siga las instrucciones para usarlos. Consulte a su mdico o a su profesional de la salud por asesoramiento si tiene fiebre, escalofros, dolor de garganta o cualquier otro sntoma de resfro o gripe.  No se trate usted mismo. Este medicamento puede reducir la capacidad del cuerpo para combatir infecciones. Trate de no acercarse a personas que estn enfermas. [employer] puede aumentar el riesgo de magulladuras o sangrado. Consulte a su mdico o a su profesional de la salud si observa sangrados inusuales. Proceda con cuidado al cepillar sus dientes, usar hilo dental o Risk manager palillos para los dientes, ya que puede contraer una infeccin o Therapist, art con mayor facilidad. Si se somete a algn tratamiento dental, informe a su dentista que est [employer]. Evite tomar productos que contienen aspirina, acetaminofeno, ibuprofeno, naproxeno o quetoprofeno a menos que as lo indique su mdico. Estos productos pueden disimular la fiebre. No se debe quedar embarazada mientras recibe este medicamento. Las mujeres deben informar a su mdico si estn buscando quedar embarazadas o si creen que estn embarazadas. Existe la posibilidad de efectos secundarios graves a un beb sin nacer. Para ms informacin hable con su profesional de la salud o su farmacutico. No debe Economist a un beb mientras est usando este medicamento. Los hombres deben informar a su mdico si quieren tener nios. Este medicamento puede reducir el conteo de esperma. No trate la diarrea con productos de [employer]. Comunquese con su mdico si tiene diarrea que dura ms de 2 das o si es severa y [place]. Este medicamento puede aumentar la sensibilidad al sol. Mantngase fuera de Administrator. Si no lo puede evitar, utilice ropa protectora y crema de Photographer.  No utilice lmparas solares, camas solares ni cabinas solares. Qu efectos secundarios puedo tener al [employer] este medicamento? Efectos secundarios que debe informar a su mdico o a Barrister's clerk de la salud tan pronto como sea posible: -reacciones alrgicas como erupcin cutnea, picazn o urticarias, hinchazn de la cara, labios o lengua -conteos sanguneos bajos - este medicamento puede reducir la cantidad de glbulos blancos, glbulos rojos y plaquetas. Su riesgo de infeccin y [place]. -signos de infeccin - fiebre o escalofros, tos, dolor de garganta, Social research officer, government o dificultad para orinar -signos de reduccin de plaquetas o sangrado - magulladuras, puntos rojos en la piel, heces de color oscuro o con aspecto alquitranado, sangre en la orina -signos de reduccin de glbulos rojos - cansancio o debilidad inusual, desmayos, sensacin de mareo -problemas respiratorios -cambios en la visin -dolor en el pecho -llagas en la boca -nuseas, vmito -dolor, hinchazn, enrojecimiento en el lugar de la inyeccin -hormigueo, dolor, entumecimiento de manos o pies -enrojecimiento, hinchazn o llagas en las manos o pies -dolor estomacal -sangrado inusuales Efectos secundarios que, por lo general, no requieren atencin mdica (debe informarlos a su mdico o a su profesional de la salud si persisten o si son molestos): -cambios en las uas de las manos o pies -diarrea -picazn o sequedad de la piel -cada del cabello -dolor de cabeza -prdida del apetito -sensibilidad de los ojos a la luz -Higher education careers adviser -ojos inusualmente llorosos Puede ser que esta lista no menciona todos los posibles efectos secundarios. Comunquese a su mdico por asesoramiento mdico [employer] Usted puede informar los efectos secundarios a la FDA por telfono al 1-800-FDA-1088. Dnde debo guardar mi medicina? Este medicamento se administra en hospitales o clnicas y no necesitar  guardarlo en su domicilio. ATENCIN: Este folleto es un resumen. Puede ser que no cubra toda la posible informacin. Si usted tiene preguntas acerca de esta medicina, consulte con su mdico, su farmacutico o su profesional de Technical sales engineer.  2019 Elsevier/Gold Standard (2015-01-16 00:00:00)

## 2018-11-25 NOTE — Progress Notes (Signed)
[name]   [phone] [phone]   Clinic Follow up Note   Patient Care Team: [name], [name], DO as PCP - General [name], MD as Consulting Physician (Colon and Rectal Surgery) [name], MD as Consulting Physician (Hematology)  Date of Service:  11/25/2018  CHIEF COMPLAINT: F/u of colon cancer  SUMMARY OF ONCOLOGIC HISTORY: Oncology History   Cancer Staging Cancer of right colon [facility]) Staging form: Colon and Rectum, AJCC 8th Edition - Pathologic stage from 10/21/2018: Stage IVC (pT4b, pN1a, pM1c) - Signed by [name], MD on 29/06/9891  Follicular lymphoma ([place]), History of Staging form: Lymphoid Neoplasms, AJCC 6th Edition - Clinical: Stage II - Signed by Curt Bears, MD on 12/26/4172       Follicular lymphoma [facility]), History of   11/2009 Initial Diagnosis    Follicular lymphoma ([place]), History of     Chemotherapy    1) Status post 6 cycles of systemic chemotherapy with CHOP/Rituxan last dose given 05/01/2009.  2) Maintenance Rituxan at 375 mg per meter square given every 2 months status post 12 cycles      Cancer of right colon ([place])   10/21/2018 Surgery    Exploratory laparotomy right hemicolectomy by Dr. [name] and Dr. [name]  10/21/18    10/21/2018 Pathology Results    Diagnosis 10/21/18 Colon, segmental resection for tumor, right ascending and appendix - ADENOCARCINOMA, MODERATE TO POORLY DIFFERENTIATED (4 CM) - METASTATIC CARCINOMA INVOLVING ONE OF EIGHTEEN LYMPH NODES (1/18) - CARCINOMA EXTENDS INTO THE APPENDIX - TWO TUMOR DEPOSITS PRESENT - SEE ONCOLOGY TABLE AND COMMENT BELOW    10/21/2018 Cancer Staging    Staging form: Colon and Rectum, AJCC 8th Edition - Pathologic stage from 10/21/2018: Stage IVC (pT4b, pN1a, pM1c) - Signed by [name], MD on 11/13/2018    11/04/2018 Imaging    CT AP W Contrast 11/04/18  IMPRESSION: 1. Interval appendectomy. There is residual soft tissue thickening and  stranding in the right lower quadrant adjacent to the cecum which may represent ongoing inflammation versus postsurgical changes. A small soft tissue density adjacent to the surgical sutures may reflect small hematoma or operative collection. No large focal fluid collection to suggest drainable abscess allowing for absence of contrast 2. Fluid-filled colon without wall thickening, could reflect diarrheal process    11/08/2018 Imaging    CT Chest W Contrast 11/08/18  IMPRESSION: 1. No acute findings are noted in the thorax to account for the patient's symptoms. 2. Aortic atherosclerosis, in addition to left main and 3 vessel coronary artery disease. Please note that although the presence of coronary artery calcium documents the presence of coronary artery disease, the severity of this disease and any potential stenosis cannot be assessed on this non-gated CT examination. Assessment for potential risk factor modification, dietary therapy or pharmacologic therapy may be warranted, if clinically indicated. 3. Additional incidental findings, as above. Aortic Atherosclerosis (ICD10-I70.0).    11/12/2018 Initial Diagnosis    Cancer of right colon ([place])    11/25/2018 -  Chemotherapy    FOLFOX q2weeks starting 11/25/18      CURRENT THERAPY:  FOLFOX every 2 weeks starting 11/25/18  INTERVAL HISTORY:  Blake Vaughn is here for a follow up and cycle 1 FOLFOX. He presents to  Infusion room with his brother and electronic spanish interpretor. He notes his wound has not healed completely. He wonders does he need to take any medication after infusion. He notes he has not picked up his antiemetics yet.  REVIEW OF SYSTEMS:   Constitutional: Denies fevers, chills or abnormal weight loss Eyes: Denies blurriness of vision Ears, nose, mouth, throat, and face: Denies mucositis or sore throat Respiratory: Denies cough, dyspnea or wheezes Cardiovascular: Denies palpitation, chest discomfort or  lower extremity swelling Gastrointestinal:  Denies nausea, heartburn or change in bowel habits Skin: Denies abnormal skin rashes (+) His surgical wound still healing, with mild pain  Lymphatics: Denies new lymphadenopathy or easy bruising Neurological:Denies numbness, tingling or new weaknesses Behavioral/Psych: Mood is stable, no new changes  All other systems were reviewed with the patient and are negative.  MEDICAL HISTORY:  Past Medical History:  Diagnosis Date  . Appendicitis 10/21/2018  . Diabetes mellitus   . Follicular lymphoma ([place]), 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   Qualifier: Diagnosis of  By: [name], [name]   . Hypercholesterolemia   . Hypertension 11/27/2011  . Lymphoma [facility])    gets annual chemo last tx Feb 2013  . TOBACCO USE, QUIT 08/18/2009   Qualifier: Diagnosis of  By: Drue Flirt  MD, Merrily Brittle      SURGICAL HISTORY: Past Surgical History:  Procedure Laterality Date  . ESOPHAGOGASTRODUODENOSCOPY (EGD) WITH PROPOFOL N/A 11/05/2018   Procedure: ESOPHAGOGASTRODUODENOSCOPY (EGD) WITH PROPOFOL;  Surgeon: [name], MD;  Location: [place];  Service: Endoscopy;  Laterality: N/A;  . IR IMAGING GUIDED PORT INSERTION  11/24/2018  . LAPAROSCOPIC APPENDECTOMY N/A 10/21/2018   Procedure: Exploratory laparotomy right hemicolectomy;  Surgeon: [name], MD;  Location: [place];  Service: General;  Laterality: N/A;    I have reviewed the social history and family history with the patient and they are unchanged from previous note.  ALLERGIES:  is allergic to iohexol.  MEDICATIONS:  Current Outpatient Medications  Medication Sig Dispense Refill  . Alcohol Swabs PADS Use as instructed 100 each 6  . atorvastatin (LIPITOR) 10 MG tablet Take 1 tablet (10 mg total) by mouth daily. 90 tablet 4  . Blood Glucose Monitoring Suppl (RELION PRIME MONITOR) DEVI Use device as instructed 1 Device 0  . docusate sodium (COLACE) 100 MG capsule Take 1 capsule (100 mg total) by mouth  2 (two) times daily. 10 capsule 0  . feeding supplement (ENSURE SURGERY) LIQD Take 237 mLs by mouth 2 (two) times daily between meals.    Marland Kitchen glucose blood test strip Use as instructed 100 each 12  . lidocaine-prilocaine (EMLA) cream Apply to affected area once 30 g 3  . metFORMIN (GLUCOPHAGE) 1000 MG tablet Take 1 tablet (1,000 mg total) by mouth 2 (two) times daily with a meal. 90 tablet 4  . metoprolol tartrate (LOPRESSOR) 25 MG tablet Take 0.5 tablets (12.5 mg total) by mouth 2 (two) times daily. 60 tablet 0  . ondansetron (ZOFRAN) 8 MG tablet Take 1 tablet (8 mg total) by mouth 2 (two) times daily as needed for refractory nausea / vomiting. Start on day 3 after chemotherapy. 30 tablet 1  . polyethylene glycol (MIRALAX / GLYCOLAX) packet Take 17 g by mouth daily. 14 each 0  . prochlorperazine (COMPAZINE) 10 MG tablet Take 1 tablet (10 mg total) by mouth every 6 (six) hours as needed (Nausea or vomiting). 30 tablet 1  . RELION LANCETS STANDARD 21G MISC Use as instructed 200 each 6  . saccharomyces boulardii (FLORASTOR) 250 MG capsule Take 1 capsule (250 mg total) by mouth 2 (two) times daily.     No current facility-administered medications for this visit.     PHYSICAL EXAMINATION: ECOG PERFORMANCE STATUS: 1 -  Symptomatic but completely ambulatory  There were no vitals filed for this visit. There were no vitals filed for this visit.  GENERAL:alert, no distress and comfortable SKIN: skin color, texture, turgor are normal, no rashes or significant lesions EYES: normal, Conjunctiva are pink and non-injected, sclera clear OROPHARYNX:no exudate, no erythema and lips, buccal mucosa, and tongue normal  NECK: supple, thyroid normal size, non-tender, without nodularity LYMPH:  no palpable lymphadenopathy in the cervical, axillary or inguinal LUNGS: clear to auscultation and percussion with normal breathing effort HEART: regular rate & rhythm and no murmurs and no lower extremity  edema ABDOMEN:abdomen soft, non-tender and normal bowel sounds (+) surgical incision has healed well  Musculoskeletal:no cyanosis of digits and no clubbing  NEURO: alert & oriented x 3 with fluent speech, no focal motor/sensory deficits  LABORATORY DATA:  I have reviewed the data as listed CBC Latest Ref Rng & Units 11/25/2018 11/24/2018 11/08/2018  WBC 4.0 - 10.5 K/uL 8.7 10.8(H) 6.6  Hemoglobin 13.0 - 17.0 g/dL 10.5(L) 11.7(L) 8.0(L)  Hematocrit 39.0 - 52.0 % 31.7(L) 35.6(L) 24.9(L)  Platelets 150 - 400 K/uL 186 213 329     CMP Latest Ref Rng & Units 11/25/2018 11/08/2018 11/07/2018  Glucose 70 - 99 mg/dL 345(H) 179(H) 142(H)  BUN 6 - 20 mg/dL 18 7 5(L)  Creatinine 0.61 - 1.24 mg/dL 1.02 0.76 0.65  Sodium 135 - 145 mmol/L 135 137 138  Potassium 3.5 - 5.1 mmol/L 5.0 3.8 3.3(L)  Chloride 98 - 111 mmol/L 103 105 107  CO2 22 - 32 mmol/L '23 24 23  '$ Calcium 8.9 - 10.3 mg/dL 8.9 8.7(L) 8.1(L)  Total Protein 6.5 - 8.1 g/dL 6.0(L) - -  Total Bilirubin 0.3 - 1.2 mg/dL 0.3 - -  Alkaline Phos 38 - 126 U/L 96 - -  AST 15 - 41 U/L 9(L) - -  ALT 0 - 44 U/L 13 - -      RADIOGRAPHIC STUDIES: I have personally reviewed the radiological images as listed and agreed with the findings in the report. Ir Imaging Guided Port Insertion  Result Date: 11/24/2018 CLINICAL DATA:  Colon carcinoma EXAM: TUNNEL POWER PORT PLACEMENT WITH SUBCUTANEOUS POCKET UTILIZING ULTRASOUND & FLOUROSCOPY FLUOROSCOPY TIME:  18 seconds.  Five mGy. MEDICATIONS AND MEDICAL HISTORY: Versed 4 mg, Fentanyl 100 mcg. Additional Medications: Ancef 2 g. Antibiotics were given within 2 hours of the procedure. ANESTHESIA/SEDATION: Moderate sedation time: 33 minutes. Nursing monitored the the patient during the procedure. PROCEDURE: After written informed consent was obtained, patient was placed in the supine position on angiographic table. The right neck and chest was prepped and draped in a sterile fashion. Lidocaine was utilized for  local anesthesia. The right jugular vein was noted to be patent initially with ultrasound. Under sonographic guidance, a micropuncture needle was inserted into the right IJ vein (Ultrasound and fluoroscopic image documentation was performed). The needle was removed over an 018 wire which was exchanged for a Amplatz. This was advanced into the IVC. An 8-French dilator was advanced over the Amplatz. A small incision was made in the right upper chest over the anterior right second rib. Utilizing blunt dissection, a subcutaneous pocket was created in the caudal direction. The pocket was irrigated with a copious amount of sterile normal saline. The port catheter was tunneled from the chest incision, and out the neck incision. The reservoir was inserted into the subcutaneous pocket and secured with two 3-0 Ethilon stitches. A peel-away sheath was advanced over the Amplatz wire. The port  catheter was cut to measure length and inserted through the peel-away sheath. The peel-away sheath was removed. The chest incision was closed with 3-0 Vicryl interrupted stitches for the subcutaneous tissue and a running of 4-0 Vicryl subcuticular stitch for the skin. The neck incision was closed with a 4-0 Vicryl subcuticular stitch. Derma-bond was applied to both surgical incisions. The port reservoir was flushed and instilled with heparinized saline. No complications. FINDINGS: A right IJ vein Port-A-Cath is in place with its tip at the cavoatrial junction. COMPLICATIONS: None IMPRESSION: Successful 8 French right internal jugular vein power port placement with its tip at the SVC/RA junction. Electronically Signed   By: Marybelle Killings M.D.   On: 11/24/2018 15:03     ASSESSMENT & PLAN:  Blake Vaughn is a 57 y.o. male with   1. Right Cecal adenocarcinoma, pT4bN1aM1c, Stage IVC, with limited peritoneal metastasis, resected, Grade II-III, MMR normal  -He was recently diagnosed in 10/2018. He is s/p right hemicolectomy, 2 visible  peritoneal metastasis was also resected, surgical margins were negative. Although he had complete surgical resection he is at very high risk, especially peritoneal metastasis.  -To reduce his risk of recurrence he will proceed with adjuvant chemotherapy.  -He has not had colonoscopy. Post treatment we will arrange colonoscopy. His 10/2018 Upper Endoscopy was normal.  -He completed chemo education class and PAC placement. I reviewed potential side effects with patient again. I will send his antiemetics and EMLA cream prescriptions to [place], he can use the grant from our cancer center to pain medicine. -Labs reviewed, Hg at 10.5, ferritin 21, Sat at 11, Glucose at 345. Overall adequate to proceed with first cycle FOLFOX.  -F/u in 2 weeks   2. H/o Follicular Lymphoma  -Status post 6 cycles of systemic chemotherapy with CHOP/Rituxan last dose given 05/01/2009.  -Maintenance Rituxan at 375 mg per meter square given every 2 months status post 12 cycles  -Has been on observation since and last seen by Dr. Julien Nordmann in 2017. -NED so far   3. DM and Hypercholesterolemia -On metformin, Lipitor and metoprolol -He goes to [facility] for refills, has no set PCP.   4. Financial Support  -He currently does not have health care insurance. He has applied for Medicaid, now pending.    -His wife is in [place] currently. He has 2 children and brothers to help him at home. -I will set up his meeting with our financial office to help apply for grant through [facility] for financial assistance.   5. Anemia, secondary to blood loss  -S/p Blood Transfusion on 11/09/18 -Continue oral iron Ferrous sulfate daily. -Hg at 10.5, Ferritin 21, Sat at 11 today (11/25/18)    PLAN:  -Lab reviewed and adequate to proceed with first cycle FOLFOX today  -Lab, flush, FOLFOX in 2 weeks     No problem-specific Assessment & Plan notes found for this encounter.   No orders of the defined types were placed in  this encounter.  All questions were answered. The patient knows to call the clinic with any problems, questions or concerns. No barriers to learning was detected. I spent 20 minutes counseling the patient face to face. The total time spent in the appointment was 25 minutes and more than 50% was on counseling and review of test results. I used the video interpreter service.     [name], MD 11/25/2018   I, Joslyn Devon, am acting as scribe for [name], MD.   I have  reviewed the above documentation for accuracy and completeness, and I agree with the above.

## 2018-11-25 NOTE — Progress Notes (Signed)
Pt given chemo education with the video interpreter. Name of interpreter was Gilberto Better, reference number [id number]. Pt was given the opportunity to ask questions about his treatment.  All questions were answered.

## 2018-11-26 ENCOUNTER — Telehealth: Payer: Self-pay

## 2018-11-26 ENCOUNTER — Telehealth: Payer: Self-pay | Admitting: Hematology

## 2018-11-26 ENCOUNTER — Encounter: Payer: Self-pay | Admitting: Hematology

## 2018-11-26 NOTE — Telephone Encounter (Signed)
No los per 12/19.

## 2018-11-26 NOTE — Telephone Encounter (Signed)
Using [employer] spanish interpreter Ashok Cordia [phone] left voice message for patient that he needs to come to the Cancer center tomorrow and she Shauna in financial assistance office to sign papers so he can get his medication free at [place].

## 2018-11-26 NOTE — Progress Notes (Signed)
Called [facility] to see if patient went to pick up meds. She states he picked up this morning and paid for them out of pocket.   Advised he would be approved for the grant, I just need him to sign paperwork. She verbalized understanding. Patient is set up for future fills.

## 2018-11-27 ENCOUNTER — Inpatient Hospital Stay: Payer: Medicaid Other

## 2018-11-27 VITALS — BP 138/76 | HR 79 | Temp 98.3°F | Resp 16

## 2018-11-27 DIAGNOSIS — Z5111 Encounter for antineoplastic chemotherapy: Secondary | ICD-10-CM | POA: Diagnosis not present

## 2018-11-27 DIAGNOSIS — C182 Malignant neoplasm of ascending colon: Secondary | ICD-10-CM

## 2018-11-27 MED ORDER — HEPARIN SOD (PORK) LOCK FLUSH 100 UNIT/ML IV SOLN
500.0000 [IU] | Freq: Once | INTRAVENOUS | Status: AC | PRN
  Administered 2018-11-27: 500 [IU]
  Filled 2018-11-27: qty 5

## 2018-11-27 MED ORDER — SODIUM CHLORIDE 0.9% FLUSH
10.0000 mL | INTRAVENOUS | Status: DC | PRN
  Administered 2018-11-27: 10 mL
  Filled 2018-11-27: qty 10

## 2018-12-02 ENCOUNTER — Ambulatory Visit: Payer: Self-pay | Admitting: Family Medicine

## 2018-12-03 NOTE — Progress Notes (Signed)
[name]   [phone] [phone]   Clinic Follow up Note   Patient Care Team: [name], [name], DO as PCP - General [name], MD as Consulting Physician (Colon and Rectal Surgery) [name], MD as Consulting Physician (Hematology)  Date of Service:  12/06/2018  CHIEF COMPLAINT:  F/u of colon cancer  SUMMARY OF ONCOLOGIC HISTORY: Oncology History   Cancer Staging Cancer of right colon [facility]) Staging form: Colon and Rectum, AJCC 8th Edition - Pathologic stage from 10/21/2018: Stage IVC (pT4b, pN1a, pM1c) - Signed by [name], MD on 45/05/2562  Follicular lymphoma ([place]), History of Staging form: Lymphoid Neoplasms, AJCC 6th Edition - Clinical: Stage II - Signed by Curt Bears, MD on 8/93/7342       Follicular lymphoma [facility]), History of   11/2009 Initial Diagnosis    Follicular lymphoma ([place]), History of     Chemotherapy    1) Status post 6 cycles of systemic chemotherapy with CHOP/Rituxan last dose given 05/01/2009.  2) Maintenance Rituxan at 375 mg per meter square given every 2 months status post 12 cycles      Cancer of right colon ([place])   10/21/2018 Surgery    Exploratory laparotomy right hemicolectomy by Dr. [name] and Dr. [name]  10/21/18    10/21/2018 Pathology Results    Diagnosis 10/21/18 Colon, segmental resection for tumor, right ascending and appendix - ADENOCARCINOMA, MODERATE TO POORLY DIFFERENTIATED (4 CM) - METASTATIC CARCINOMA INVOLVING ONE OF EIGHTEEN LYMPH NODES (1/18) - CARCINOMA EXTENDS INTO THE APPENDIX - TWO TUMOR DEPOSITS PRESENT - SEE ONCOLOGY TABLE AND COMMENT BELOW    10/21/2018 Cancer Staging    Staging form: Colon and Rectum, AJCC 8th Edition - Pathologic stage from 10/21/2018: Stage IVC (pT4b, pN1a, pM1c) - Signed by [name], MD on 11/13/2018    11/04/2018 Imaging    CT AP W Contrast 11/04/18  IMPRESSION: 1. Interval appendectomy. There is residual soft tissue thickening and  stranding in the right lower quadrant adjacent to the cecum which may represent ongoing inflammation versus postsurgical changes. A small soft tissue density adjacent to the surgical sutures may reflect small hematoma or operative collection. No large focal fluid collection to suggest drainable abscess allowing for absence of contrast 2. Fluid-filled colon without wall thickening, could reflect diarrheal process    11/08/2018 Imaging    CT Chest W Contrast 11/08/18  IMPRESSION: 1. No acute findings are noted in the thorax to account for the patient's symptoms. 2. Aortic atherosclerosis, in addition to left main and 3 vessel coronary artery disease. Please note that although the presence of coronary artery calcium documents the presence of coronary artery disease, the severity of this disease and any potential stenosis cannot be assessed on this non-gated CT examination. Assessment for potential risk factor modification, dietary therapy or pharmacologic therapy may be warranted, if clinically indicated. 3. Additional incidental findings, as above. Aortic Atherosclerosis (ICD10-I70.0).    11/12/2018 Initial Diagnosis    Cancer of right colon ([name])    11/25/2018 -  Chemotherapy    FOLFOX q2weeks starting 11/25/18      CURRENT THERAPY:  FOLFOX every 2 weeks starting 11/25/18  INTERVAL HISTORY:  Blake Vaughn is here for a follow up after cycle 1 FOLFOX. He presents to the clinic today with his Spanish interpreter. He notes he tolerated treatment moderately well. He notes in the past 3-4 days he has had trouble sleeping. He has not tried anything for his sleep. He denies BM or  abdominal issues and denies signs of neuropathy.       REVIEW OF SYSTEMS:   Constitutional: Denies fevers, chills or abnormal weight loss (+) trouble sleeping Eyes: Denies blurriness of vision Ears, nose, mouth, throat, and face: Denies mucositis or sore throat Respiratory: Denies cough, dyspnea or  wheezes Cardiovascular: Denies palpitation, chest discomfort or lower extremity swelling Gastrointestinal:  Denies nausea, heartburn or change in bowel habits Skin: Denies abnormal skin rashes Lymphatics: Denies new lymphadenopathy or easy bruising Neurological:Denies numbness, tingling or new weaknesses Behavioral/Psych: Mood is stable, no new changes  All other systems were reviewed with the patient and are negative.  MEDICAL HISTORY:  Past Medical History:  Diagnosis Date  . Appendicitis 10/21/2018  . Diabetes mellitus   . Follicular lymphoma ([place]), 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   Qualifier: Diagnosis of  By: [name], [name]   . Hypercholesterolemia   . Hypertension 11/27/2011  . Lymphoma [facility])    gets annual chemo last tx Feb 2013  . TOBACCO USE, QUIT 08/18/2009   Qualifier: Diagnosis of  By: Drue Flirt  MD, Merrily Brittle      SURGICAL HISTORY: Past Surgical History:  Procedure Laterality Date  . ESOPHAGOGASTRODUODENOSCOPY (EGD) WITH PROPOFOL N/A 11/05/2018   Procedure: ESOPHAGOGASTRODUODENOSCOPY (EGD) WITH PROPOFOL;  Surgeon: [name], MD;  Location: [place];  Service: Endoscopy;  Laterality: N/A;  . IR IMAGING GUIDED PORT INSERTION  11/24/2018  . LAPAROSCOPIC APPENDECTOMY N/A 10/21/2018   Procedure: Exploratory laparotomy right hemicolectomy;  Surgeon: [name], MD;  Location: [place];  Service: General;  Laterality: N/A;    I have reviewed the social history and family history with the patient and they are unchanged from previous note.  ALLERGIES:  is allergic to iohexol.  MEDICATIONS:  Current Outpatient Medications  Medication Sig Dispense Refill  . Alcohol Swabs PADS Use as instructed 100 each 6  . atorvastatin (LIPITOR) 10 MG tablet Take 1 tablet (10 mg total) by mouth daily. 90 tablet 4  . Blood Glucose Monitoring Suppl (RELION PRIME MONITOR) DEVI Use device as instructed 1 Device 0  . docusate sodium (COLACE) 100 MG capsule Take 1 capsule (100 mg  total) by mouth 2 (two) times daily. 10 capsule 0  . feeding supplement (ENSURE SURGERY) LIQD Take 237 mLs by mouth 2 (two) times daily between meals.    Marland Kitchen glucose blood test strip Use as instructed 100 each 12  . lidocaine-prilocaine (EMLA) cream Apply to affected area once 30 g 3  . metFORMIN (GLUCOPHAGE) 1000 MG tablet Take 1 tablet (1,000 mg total) by mouth 2 (two) times daily with a meal. 90 tablet 4  . metoprolol tartrate (LOPRESSOR) 25 MG tablet Take 0.5 tablets (12.5 mg total) by mouth 2 (two) times daily. 60 tablet 0  . ondansetron (ZOFRAN) 8 MG tablet Take 1 tablet (8 mg total) by mouth 2 (two) times daily as needed for refractory nausea / vomiting. Start on day 3 after chemotherapy. 30 tablet 1  . polyethylene glycol (MIRALAX / GLYCOLAX) packet Take 17 g by mouth daily. 14 each 0  . prochlorperazine (COMPAZINE) 10 MG tablet Take 1 tablet (10 mg total) by mouth every 6 (six) hours as needed (Nausea or vomiting). 30 tablet 1  . RELION LANCETS STANDARD 21G MISC Use as instructed 200 each 6  . saccharomyces boulardii (FLORASTOR) 250 MG capsule Take 1 capsule (250 mg total) by mouth 2 (two) times daily.     No current facility-administered medications for this visit.     PHYSICAL  EXAMINATION: ECOG PERFORMANCE STATUS: 1 - Symptomatic but completely ambulatory  Vitals:   12/06/18 1422  BP: 108/73  Pulse: 72  Resp: 18  Temp: 98.7 F (37.1 C)  SpO2: 100%   Filed Weights   12/06/18 1422  Weight: 184 lb (83.5 kg)    GENERAL:alert, no distress and comfortable SKIN: skin color, texture, turgor are normal, no rashes or significant lesions EYES: normal, Conjunctiva are pink and non-injected, sclera clear OROPHARYNX:no exudate, no erythema and lips, buccal mucosa, and tongue normal  NECK: supple, thyroid normal size, non-tender, without nodularity LYMPH:  no palpable lymphadenopathy in the cervical, axillary or inguinal LUNGS: clear to auscultation and percussion with normal  breathing effort HEART: regular rate & rhythm and no murmurs and no lower extremity edema ABDOMEN:abdomen soft, non-tender and normal bowel sounds Musculoskeletal:no cyanosis of digits and no clubbing  NEURO: alert & oriented x 3 with fluent speech, no focal motor/sensory deficits  LABORATORY DATA:  I have reviewed the data as listed CBC Latest Ref Rng & Units 12/06/2018 11/25/2018 11/24/2018  WBC 4.0 - 10.5 K/uL 6.7 8.7 10.8(H)  Hemoglobin 13.0 - 17.0 g/dL 11.1(L) 10.5(L) 11.7(L)  Hematocrit 39.0 - 52.0 % 32.8(L) 31.7(L) 35.6(L)  Platelets 150 - 400 K/uL 239 186 213     CMP Latest Ref Rng & Units 12/06/2018 11/25/2018 11/08/2018  Glucose 70 - 99 mg/dL 388(H) 345(H) 179(H)  BUN 6 - 20 mg/dL _0 Creatinine 0.61 - 1.24 mg/dL 1.21 1.02 0.76  Sodium 135 - 145 mmol/L 131(L) 135 137  Potassium 3.5 - 5.1 mmol/L 5.2(H) 5.0 3.8  Chloride 98 - 111 mmol/L 101 103 105  CO2 22 - 32 mmol/L _1 Calcium 8.9 - 10.3 mg/dL 9.1 8.9 8.7(L)  Total Protein 6.5 - 8.1 g/dL 6.2(L) 6.0(L) -  Total Bilirubin 0.3 - 1.2 mg/dL 0.4 0.3 -  Alkaline Phos 38 - 126 U/L 102 96 -  AST 15 - 41 U/L 12(L) 9(L) -  ALT 0 - 44 U/L 15 13 -      RADIOGRAPHIC STUDIES: I have personally reviewed the radiological images as listed and agreed with the findings in the report. No results found.   ASSESSMENT & PLAN:  Blake Vaughn is a 57 y.o. male with   1.RightCecal adenocarcinoma,pT4bN1aM1c,Stage IVC,with limited peritoneal metastasis,resected,Grade II-III, MMR normal -He was recently diagnosed in 10/2018. He is s/p right hemicolectomy, 2 visible peritoneal metastasis was also resected, surgical margins were negative. Although he had complete surgical resection he is at very high risk,especially peritoneal metastasis.  -To reduce his risk of recurrence he started adjuvant FOLFOX q2weeks on 11/25/18.   -He has not had colonoscopy.Post treatment we will arrange colonoscopy. His 10/2018 Upper Endoscopy  was normal.  -He has tolerated first cycle FOLFOX overall well with no significant side effects.  -Labs reviewed, K at 5.2, Sodium at 131, Glucose at 388, Hg at 11.1. Overall adequate to proceed with FOLFOX tomorrow morning.  -F/u in 2 weeks    2. H/o Follicular Lymphoma  -Status post 6 cycles of systemic chemotherapy with CHOP/Rituxan last dose given 05/01/2009. -Maintenance Rituxan at 375 mg per meter square given every 2 months status post 12 cycles -Has been on observation sinceand last seen by Dr. Julien Nordmann in 2017. -NED so far  3. DM and Hypercholesterolemia -On metformin, Lipitor and metoprolol -He goes to [facility] for refills, has no set PCP. -will monitor closely during chemo    4. Financial Support -He currently  does not have health care insurance. He has applied for Medicaid, now pending.    -His wife is in [place] currently. He has 2 children and brothers to help him at home.  5. Insomnia  -I recommend Melatonin or Benadryl. I reviewed sleep hygiene with him and advised him to avoid caffeine in evening.   6. Anemia, secondary to blood loss  -S/p Blood Transfusion on 11/09/18 -Continue oral iron Ferrous sulfate daily. -Hg at 11.1 today (12/06/18), improved     PLAN: -Lab reviewed and adequate to proceed with second cycle FOLFOX tomorrow -Lab, flush, f/u and FOLFOX in 2 weeks      No problem-specific Assessment & Plan notes found for this encounter.   No orders of the defined types were placed in this encounter.  All questions were answered. The patient knows to call the clinic with any problems, questions or concerns. No barriers to learning was detected. I spent 20 minutes counseling the patient face to face. The total time spent in the appointment was 25 minutes and more than 50% was on counseling and review of test results     [name], MD 12/06/2018   I, Joslyn Devon, am acting as scribe for [name], MD.   I have reviewed the  above documentation for accuracy and completeness, and I agree with the above.

## 2018-12-06 ENCOUNTER — Ambulatory Visit: Payer: Self-pay | Admitting: Hematology

## 2018-12-06 ENCOUNTER — Inpatient Hospital Stay: Payer: Medicaid Other

## 2018-12-06 ENCOUNTER — Encounter: Payer: Self-pay | Admitting: Hematology

## 2018-12-06 ENCOUNTER — Telehealth: Payer: Self-pay | Admitting: Hematology

## 2018-12-06 ENCOUNTER — Inpatient Hospital Stay (HOSPITAL_BASED_OUTPATIENT_CLINIC_OR_DEPARTMENT_OTHER): Payer: Medicaid Other | Admitting: Hematology

## 2018-12-06 ENCOUNTER — Other Ambulatory Visit: Payer: Self-pay

## 2018-12-06 VITALS — BP 108/73 | HR 72 | Temp 98.7°F | Resp 18 | Ht 65.0 in | Wt 184.0 lb

## 2018-12-06 DIAGNOSIS — C182 Malignant neoplasm of ascending colon: Secondary | ICD-10-CM

## 2018-12-06 DIAGNOSIS — C786 Secondary malignant neoplasm of retroperitoneum and peritoneum: Secondary | ICD-10-CM

## 2018-12-06 DIAGNOSIS — Z95828 Presence of other vascular implants and grafts: Secondary | ICD-10-CM

## 2018-12-06 DIAGNOSIS — C180 Malignant neoplasm of cecum: Secondary | ICD-10-CM

## 2018-12-06 DIAGNOSIS — D500 Iron deficiency anemia secondary to blood loss (chronic): Secondary | ICD-10-CM

## 2018-12-06 DIAGNOSIS — G4700 Insomnia, unspecified: Secondary | ICD-10-CM | POA: Diagnosis not present

## 2018-12-06 DIAGNOSIS — Z79899 Other long term (current) drug therapy: Secondary | ICD-10-CM

## 2018-12-06 DIAGNOSIS — Z5111 Encounter for antineoplastic chemotherapy: Secondary | ICD-10-CM | POA: Diagnosis not present

## 2018-12-06 DIAGNOSIS — Z8572 Personal history of non-Hodgkin lymphomas: Secondary | ICD-10-CM | POA: Diagnosis not present

## 2018-12-06 DIAGNOSIS — K5900 Constipation, unspecified: Secondary | ICD-10-CM | POA: Diagnosis not present

## 2018-12-06 DIAGNOSIS — E7800 Pure hypercholesterolemia, unspecified: Secondary | ICD-10-CM | POA: Diagnosis not present

## 2018-12-06 DIAGNOSIS — I10 Essential (primary) hypertension: Secondary | ICD-10-CM

## 2018-12-06 DIAGNOSIS — E119 Type 2 diabetes mellitus without complications: Secondary | ICD-10-CM | POA: Diagnosis not present

## 2018-12-06 DIAGNOSIS — E1165 Type 2 diabetes mellitus with hyperglycemia: Secondary | ICD-10-CM

## 2018-12-06 LAB — CMP (CANCER CENTER ONLY)
ALBUMIN: 3.5 g/dL (ref 3.5–5.0)
ALT: 15 U/L (ref 0–44)
AST: 12 U/L — ABNORMAL LOW (ref 15–41)
Alkaline Phosphatase: 102 U/L (ref 38–126)
Anion gap: 7 (ref 5–15)
BUN: 19 mg/dL (ref 6–20)
CALCIUM: 9.1 mg/dL (ref 8.9–10.3)
CO2: 23 mmol/L (ref 22–32)
Chloride: 101 mmol/L (ref 98–111)
Creatinine: 1.21 mg/dL (ref 0.61–1.24)
GFR, Est AFR Am: >60 mL/min (ref >60)
GFR, Estimated: >60 mL/min (ref >60)
Glucose, Bld: 388 mg/dL — ABNORMAL HIGH (ref 70–99)
Potassium: 5.2 mmol/L — ABNORMAL HIGH (ref 3.5–5.1)
Sodium: 131 mmol/L — ABNORMAL LOW (ref 135–145)
Total Bilirubin: 0.4 mg/dL (ref 0.3–1.2)
Total Protein: 6.2 g/dL — ABNORMAL LOW (ref 6.5–8.1)

## 2018-12-06 LAB — CBC WITH DIFFERENTIAL (CANCER CENTER ONLY)
Abs Immature Granulocytes: 0.01 10*3/uL (ref 0.00–0.07)
BASOS PCT: 0 %
Basophils Absolute: 0 10*3/uL (ref 0.0–0.1)
EOS ABS: 0.3 10*3/uL (ref 0.0–0.5)
Eosinophils Relative: 5 %
HCT: 32.8 % — ABNORMAL LOW (ref 39.0–52.0)
Hemoglobin: 11.1 g/dL — ABNORMAL LOW (ref 13.0–17.0)
IMMATURE GRANULOCYTES: 0 %
Lymphocytes Relative: 29 %
Lymphs Abs: 2 10*3/uL (ref 0.7–4.0)
MCH: 28.8 pg (ref 26.0–34.0)
MCHC: 33.8 g/dL (ref 30.0–36.0)
MCV: 85 fL (ref 80.0–100.0)
Monocytes Absolute: 0.7 10*3/uL (ref 0.1–1.0)
Monocytes Relative: 10 %
NEUTROS PCT: 56 %
Neutro Abs: 3.7 10*3/uL (ref 1.7–7.7)
Platelet Count: 239 10*3/uL (ref 150–400)
RBC: 3.86 MIL/uL — ABNORMAL LOW (ref 4.22–5.81)
RDW: 13.5 % (ref 11.5–15.5)
WBC Count: 6.7 10*3/uL (ref 4.0–10.5)
nRBC: 0 % (ref 0.0–0.2)

## 2018-12-06 MED ORDER — SODIUM CHLORIDE 0.9% FLUSH
10.0000 mL | INTRAVENOUS | Status: DC | PRN
  Administered 2018-12-06: 10 mL
  Filled 2018-12-06: qty 10

## 2018-12-06 MED ORDER — HEPARIN SOD (PORK) LOCK FLUSH 100 UNIT/ML IV SOLN
500.0000 [IU] | Freq: Once | INTRAVENOUS | Status: AC | PRN
  Administered 2018-12-06: 500 [IU]
  Filled 2018-12-06: qty 5

## 2018-12-06 NOTE — Telephone Encounter (Signed)
No los per 12/30.  Printed calendar per patient request.

## 2018-12-07 ENCOUNTER — Encounter: Payer: Self-pay | Admitting: Pharmacy Technician

## 2018-12-07 ENCOUNTER — Other Ambulatory Visit: Payer: Self-pay

## 2018-12-07 ENCOUNTER — Encounter: Payer: Self-pay | Admitting: Hematology

## 2018-12-07 ENCOUNTER — Inpatient Hospital Stay: Payer: Medicaid Other

## 2018-12-07 VITALS — BP 148/69 | HR 80 | Temp 98.1°F | Resp 18

## 2018-12-07 DIAGNOSIS — C182 Malignant neoplasm of ascending colon: Secondary | ICD-10-CM

## 2018-12-07 DIAGNOSIS — Z5111 Encounter for antineoplastic chemotherapy: Secondary | ICD-10-CM | POA: Diagnosis not present

## 2018-12-07 MED ORDER — LEUCOVORIN CALCIUM INJECTION 350 MG
400.0000 mg/m2 | Freq: Once | INTRAVENOUS | Status: AC
  Administered 2018-12-07: 788 mg via INTRAVENOUS
  Filled 2018-12-07: qty 39.4

## 2018-12-07 MED ORDER — DEXAMETHASONE SODIUM PHOSPHATE 10 MG/ML IJ SOLN
INTRAMUSCULAR | Status: AC
  Filled 2018-12-07: qty 1

## 2018-12-07 MED ORDER — OXALIPLATIN CHEMO INJECTION 100 MG/20ML
85.0000 mg/m2 | Freq: Once | INTRAVENOUS | Status: AC
  Administered 2018-12-07: 165 mg via INTRAVENOUS
  Filled 2018-12-07: qty 33

## 2018-12-07 MED ORDER — SODIUM CHLORIDE 0.9 % IV SOLN
2400.0000 mg/m2 | INTRAVENOUS | Status: DC
  Administered 2018-12-07: 4750 mg via INTRAVENOUS
  Filled 2018-12-07: qty 95

## 2018-12-07 MED ORDER — DEXTROSE 5 % IV SOLN
Freq: Once | INTRAVENOUS | Status: AC
  Administered 2018-12-07: 09:00:00 via INTRAVENOUS
  Filled 2018-12-07: qty 250

## 2018-12-07 MED ORDER — PALONOSETRON HCL INJECTION 0.25 MG/5ML
INTRAVENOUS | Status: AC
  Filled 2018-12-07: qty 5

## 2018-12-07 MED ORDER — FLUOROURACIL CHEMO INJECTION 2.5 GM/50ML
400.0000 mg/m2 | Freq: Once | INTRAVENOUS | Status: AC
  Administered 2018-12-07: 800 mg via INTRAVENOUS
  Filled 2018-12-07: qty 16

## 2018-12-07 MED ORDER — PALONOSETRON HCL INJECTION 0.25 MG/5ML
0.2500 mg | Freq: Once | INTRAVENOUS | Status: AC
  Administered 2018-12-07: 0.25 mg via INTRAVENOUS

## 2018-12-07 MED ORDER — DEXAMETHASONE SODIUM PHOSPHATE 10 MG/ML IJ SOLN
10.0000 mg | Freq: Once | INTRAMUSCULAR | Status: AC
  Administered 2018-12-07: 10 mg via INTRAVENOUS

## 2018-12-07 NOTE — Patient Instructions (Signed)
Come back Thursday 12/09/18 at 10:15am. Happy New Year! Blake Vaughn Discharge Instructions for Patients Receiving Chemotherapy  Today you received the following chemotherapy agents: oxaliplatin, leucovorin, 5-fluorouracil  To help prevent nausea and vomiting after your treatment, we encourage you to take your nausea medication as directed by your physician.   If you develop nausea and vomiting that is not controlled by your nausea medication, call the clinic.   BELOW ARE SYMPTOMS THAT SHOULD BE REPORTED IMMEDIATELY:  *FEVER GREATER THAN 100.5 F  *CHILLS WITH OR WITHOUT FEVER  NAUSEA AND VOMITING THAT IS NOT CONTROLLED WITH YOUR NAUSEA MEDICATION  *UNUSUAL SHORTNESS OF BREATH  *UNUSUAL BRUISING OR BLEEDING  TENDERNESS IN MOUTH AND THROAT WITH OR WITHOUT PRESENCE OF ULCERS  *URINARY PROBLEMS  *BOWEL PROBLEMS  UNUSUAL RASH Items with * indicate a potential emergency and should be followed up as soon as possible.  Feel free to call the clinic should you have any questions or concerns. The clinic phone number is [phone].  Please show the [place] at check-in to the Emergency Department and triage nurse.

## 2018-12-07 NOTE — Progress Notes (Signed)
The patient is approved for drug assistance by [employer] for Udenyca and [employer] for Feraheme. Enrollment is effective until 12/02/19 for both drugs and is based on self pay. There are no current orders entered for these drugs, drug assistance is proactive in the event that these drugs are needed.

## 2018-12-09 ENCOUNTER — Telehealth: Payer: Self-pay

## 2018-12-09 ENCOUNTER — Inpatient Hospital Stay: Payer: Medicaid Other | Attending: Hematology

## 2018-12-09 ENCOUNTER — Ambulatory Visit: Payer: Self-pay

## 2018-12-09 VITALS — BP 148/72 | HR 78 | Temp 98.2°F | Resp 17

## 2018-12-09 DIAGNOSIS — C8290 Follicular lymphoma, unspecified, unspecified site: Secondary | ICD-10-CM | POA: Diagnosis not present

## 2018-12-09 DIAGNOSIS — Z7984 Long term (current) use of oral hypoglycemic drugs: Secondary | ICD-10-CM | POA: Diagnosis not present

## 2018-12-09 DIAGNOSIS — C182 Malignant neoplasm of ascending colon: Secondary | ICD-10-CM

## 2018-12-09 DIAGNOSIS — Z79899 Other long term (current) drug therapy: Secondary | ICD-10-CM | POA: Insufficient documentation

## 2018-12-09 DIAGNOSIS — C786 Secondary malignant neoplasm of retroperitoneum and peritoneum: Secondary | ICD-10-CM | POA: Diagnosis not present

## 2018-12-09 DIAGNOSIS — Z5111 Encounter for antineoplastic chemotherapy: Secondary | ICD-10-CM | POA: Insufficient documentation

## 2018-12-09 DIAGNOSIS — G4700 Insomnia, unspecified: Secondary | ICD-10-CM | POA: Insufficient documentation

## 2018-12-09 DIAGNOSIS — D508 Other iron deficiency anemias: Secondary | ICD-10-CM | POA: Insufficient documentation

## 2018-12-09 DIAGNOSIS — Z87891 Personal history of nicotine dependence: Secondary | ICD-10-CM | POA: Insufficient documentation

## 2018-12-09 DIAGNOSIS — E119 Type 2 diabetes mellitus without complications: Secondary | ICD-10-CM | POA: Diagnosis not present

## 2018-12-09 DIAGNOSIS — E7800 Pure hypercholesterolemia, unspecified: Secondary | ICD-10-CM | POA: Insufficient documentation

## 2018-12-09 MED ORDER — SODIUM CHLORIDE 0.9% FLUSH
10.0000 mL | INTRAVENOUS | Status: DC | PRN
  Administered 2018-12-09: 10 mL
  Filled 2018-12-09: qty 10

## 2018-12-09 MED ORDER — HEPARIN SOD (PORK) LOCK FLUSH 100 UNIT/ML IV SOLN
500.0000 [IU] | Freq: Once | INTRAVENOUS | Status: AC | PRN
  Administered 2018-12-09: 500 [IU]
  Filled 2018-12-09: qty 5

## 2018-12-09 NOTE — Telephone Encounter (Signed)
-----   Message from [name], MD sent at 12/08/2018  3:19 PM EST ----- Could you call pt and let him know his glucose has been very high, and he needs see his PCP ASAP, and avoid sweets. Due to his language barrier, he may needs help to set up his f/u with his PCP. Thanks   [name]  12/08/2018

## 2018-12-09 NOTE — Telephone Encounter (Signed)
Blake Vaughn in house spanish interpreter to inform him per Dr. Burr Medico that his glucose has been very high and he needs to see his PCP as soon as possible, avoid sweets and carbohydrates.

## 2018-12-16 ENCOUNTER — Other Ambulatory Visit: Payer: Self-pay | Admitting: Family Medicine

## 2018-12-20 NOTE — Progress Notes (Signed)
[place]   [phone] Fax:([phone]) [phone]   Clinic Follow up Note   Patient Care Team: [name], [name], DO as PCP - General [name], MD as Consulting Physician (Colon and Rectal Surgery) [name], MD as Consulting Physician (Hematology) 12/23/2018  CHIEF COMPLAINT: F/u on colon cancer   SUMMARY OF ONCOLOGIC HISTORY: Oncology History   Cancer Staging Cancer of right colon [facility]) Staging form: Colon and Rectum, AJCC 8th Edition - Pathologic stage from 10/21/2018: Stage IVC (pT4b, pN1a, pM1c) - Signed by [name], MD on 91/04/568  Follicular lymphoma ([place]), History of Staging form: Lymphoid Neoplasms, AJCC 6th Edition - Clinical: Stage II - Signed by Curt Bears, MD on 7/94/8016       Follicular lymphoma [facility] History of   11/2009 Initial Diagnosis    Follicular lymphoma ([place]), History of     Chemotherapy    1) Status post 6 cycles of systemic chemotherapy with CHOP/Rituxan last dose given 05/01/2009.  2) Maintenance Rituxan at 375 mg per meter square given every 2 months status post 12 cycles      Cancer of right colon ([place])   10/21/2018 Surgery    Exploratory laparotomy right hemicolectomy by Dr. [name] and Dr. [name]  10/21/18    10/21/2018 Pathology Results    Diagnosis 10/21/18 Colon, segmental resection for tumor, right ascending and appendix - ADENOCARCINOMA, MODERATE TO POORLY DIFFERENTIATED (4 CM) - METASTATIC CARCINOMA INVOLVING ONE OF EIGHTEEN LYMPH NODES (1/18) - CARCINOMA EXTENDS INTO THE APPENDIX - TWO TUMOR DEPOSITS PRESENT - SEE ONCOLOGY TABLE AND COMMENT BELOW    10/21/2018 Cancer Staging    Staging form: Colon and Rectum, AJCC 8th Edition - Pathologic stage from 10/21/2018: Stage IVC (pT4b, pN1a, pM1c) - Signed by [name], MD on 11/13/2018    11/04/2018 Imaging    CT AP W Contrast 11/04/18  IMPRESSION: 1. Interval appendectomy. There is residual soft tissue thickening and stranding in the  right lower quadrant adjacent to the cecum which may represent ongoing inflammation versus postsurgical changes. A small soft tissue density adjacent to the surgical sutures may reflect small hematoma or operative collection. No large focal fluid collection to suggest drainable abscess allowing for absence of contrast 2. Fluid-filled colon without wall thickening, could reflect diarrheal process    11/08/2018 Imaging    CT Chest W Contrast 11/08/18  IMPRESSION: 1. No acute findings are noted in the thorax to account for the patient's symptoms. 2. Aortic atherosclerosis, in addition to left main and 3 vessel coronary artery disease. Please note that although the presence of coronary artery calcium documents the presence of coronary artery disease, the severity of this disease and any potential stenosis cannot be assessed on this non-gated CT examination. Assessment for potential risk factor modification, dietary therapy or pharmacologic therapy may be warranted, if clinically indicated. 3. Additional incidental findings, as above. Aortic Atherosclerosis (ICD10-I70.0).    11/12/2018 Initial Diagnosis    Cancer of right colon ([place])    11/25/2018 -  Chemotherapy    FOLFOX q2weeks starting 11/25/18     CURRENT THERAPY FOLFOX every 2 weeks starting12/19/19   INTERVAL HISTORY: Blake Vaughn is a 58 y.o. male who is here for follow-up. Today, he is here with an interpreter. He is doing well and is tolerating chemo well. He noted mild constipation   Pertinent positives and negatives of review of systems are listed and detailed within the above HPI.  REVIEW OF SYSTEMS:   Constitutional: Denies fevers, chills or  abnormal weight loss Eyes: Denies blurriness of vision Ears, nose, mouth, throat, and face: Denies mucositis or sore throat Respiratory: Denies cough, dyspnea or wheezes Cardiovascular: Denies palpitation, chest discomfort or lower extremity swelling Gastrointestinal:   Denies nausea, heartburn (+) mild constipation Skin: Denies abnormal skin rashes Lymphatics: Denies new lymphadenopathy or easy bruising Neurological:Denies numbness, tingling or new weaknesses Behavioral/Psych: Mood is stable, no new changes  All other systems were reviewed with the patient and are negative.  MEDICAL HISTORY:  Past Medical History:  Diagnosis Date  . Appendicitis 10/21/2018  . Diabetes mellitus   . Follicular lymphoma ([place]), 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   Qualifier: Diagnosis of  By: [name], [name]   . Hypercholesterolemia   . Hypertension 11/27/2011  . Lymphoma [facility])    gets annual chemo last tx Feb 2013  . TOBACCO USE, QUIT 08/18/2009   Qualifier: Diagnosis of  By: Drue Flirt  MD, Merrily Brittle      SURGICAL HISTORY: Past Surgical History:  Procedure Laterality Date  . ESOPHAGOGASTRODUODENOSCOPY (EGD) WITH PROPOFOL N/A 11/05/2018   Procedure: ESOPHAGOGASTRODUODENOSCOPY (EGD) WITH PROPOFOL;  Surgeon: [name], MD;  Location: [place];  Service: Endoscopy;  Laterality: N/A;  . IR IMAGING GUIDED PORT INSERTION  11/24/2018  . LAPAROSCOPIC APPENDECTOMY N/A 10/21/2018   Procedure: Exploratory laparotomy right hemicolectomy;  Surgeon: [name], MD;  Location: [place];  Service: General;  Laterality: N/A;    I have reviewed the social history and family history with the patient and they are unchanged from previous note.  ALLERGIES:  is allergic to iohexol.  MEDICATIONS:  Current Outpatient Medications  Medication Sig Dispense Refill  . Alcohol Swabs PADS Use as instructed 100 each 6  . atorvastatin (LIPITOR) 10 MG tablet Take 1 tablet (10 mg total) by mouth daily. 90 tablet 4  . Blood Glucose Monitoring Suppl (RELION PRIME MONITOR) DEVI Use device as instructed 1 Device 0  . docusate sodium (COLACE) 100 MG capsule Take 1 capsule (100 mg total) by mouth 2 (two) times daily. 10 capsule 0  . feeding supplement (ENSURE SURGERY) LIQD Take 237 mLs by mouth 2  (two) times daily between meals.    Marland Kitchen glucose blood test strip Use as instructed 100 each 12  . lidocaine-prilocaine (EMLA) cream Apply to affected area once 30 g 3  . metFORMIN (GLUCOPHAGE) 1000 MG tablet TAKE 1 TABLET BY MOUTH TWICE DAILY WITH A MEAL 90 tablet 0  . metoprolol tartrate (LOPRESSOR) 25 MG tablet Take 0.5 tablets (12.5 mg total) by mouth 2 (two) times daily. 60 tablet 0  . ondansetron (ZOFRAN) 8 MG tablet Take 1 tablet (8 mg total) by mouth 2 (two) times daily as needed for refractory nausea / vomiting. Start on day 3 after chemotherapy. 30 tablet 1  . polyethylene glycol (MIRALAX / GLYCOLAX) packet Take 17 g by mouth daily. 14 each 0  . prochlorperazine (COMPAZINE) 10 MG tablet Take 1 tablet (10 mg total) by mouth every 6 (six) hours as needed (Nausea or vomiting). 30 tablet 1  . RELION LANCETS STANDARD 21G MISC Use as instructed 200 each 6  . saccharomyces boulardii (FLORASTOR) 250 MG capsule Take 1 capsule (250 mg total) by mouth 2 (two) times daily.     No current facility-administered medications for this visit.    Facility-Administered Medications Ordered in Other Visits  Medication Dose Route Frequency Provider Last Rate Last Dose  . fluorouracil (ADRUCIL) 4,750 mg in sodium chloride 0.9 % 55 mL chemo infusion  2,400 mg/m2 (Treatment  Plan Recorded) Intravenous 1 day or 1 dose [name], MD   4,750 mg at 12/23/18 1555    PHYSICAL EXAMINATION: ECOG PERFORMANCE STATUS: 1 - Symptomatic but completely ambulatory  Vitals:   12/23/18 1057  BP: 131/71  Pulse: 81  Resp: 18  Temp: 98.2 F (36.8 C)  SpO2: 100%   Filed Weights   12/23/18 1057  Weight: 190 lb (86.2 kg)    GENERAL:alert, no distress and comfortable SKIN: skin color, texture, turgor are normal, no rashes or significant lesions EYES: normal, Conjunctiva are pink and non-injected, sclera clear OROPHARYNX:no exudate, no erythema and lips, buccal mucosa, and tongue normal  NECK: supple, thyroid normal size,  non-tender, without nodularity LYMPH:  no palpable lymphadenopathy in the cervical, axillary or inguinal LUNGS: clear to auscultation and percussion with normal breathing effort HEART: regular rate & rhythm and no murmurs and no lower extremity edema ABDOMEN:abdomen soft, non-tender and normal bowel sounds (+) incision healing well Musculoskeletal:no cyanosis of digits and no clubbing  NEURO: alert & oriented x 3 with fluent speech, no focal motor/sensory deficits  LABORATORY DATA:  I have reviewed the data as listed CBC Latest Ref Rng & Units 12/23/2018 12/06/2018 11/25/2018  WBC 4.0 - 10.5 K/uL 7.3 6.7 8.7  Hemoglobin 13.0 - 17.0 g/dL 12.0(L) 11.1(L) 10.5(L)  Hematocrit 39.0 - 52.0 % 36.0(L) 32.8(L) 31.7(L)  Platelets 150 - 400 K/uL 164 239 186     CMP Latest Ref Rng & Units 12/23/2018 12/06/2018 11/25/2018  Glucose 70 - 99 mg/dL 255(H) 388(H) 345(H)  BUN 6 - 20 mg/dL '20 19 18  ' Creatinine 0.61 - 1.24 mg/dL 0.81 1.21 1.02  Sodium 135 - 145 mmol/L 135 131(L) 135  Potassium 3.5 - 5.1 mmol/L 4.5 5.2(H) 5.0  Chloride 98 - 111 mmol/L 104 101 103  CO2 22 - 32 mmol/L '25 23 23  ' Calcium 8.9 - 10.3 mg/dL 9.0 9.1 8.9  Total Protein 6.5 - 8.1 g/dL 6.3(L) 6.2(L) 6.0(L)  Total Bilirubin 0.3 - 1.2 mg/dL 0.3 0.4 0.3  Alkaline Phos 38 - 126 U/L 101 102 96  AST 15 - 41 U/L 14(L) 12(L) 9(L)  ALT 0 - 44 U/L '17 15 13      ' RADIOGRAPHIC STUDIES: I have personally reviewed the radiological images as listed and agreed with the findings in the report. No results found.   ASSESSMENT & PLAN:  MYRLE DUES is a 58 y.o. male with history of  1.RightCecal adenocarcinoma,pT4bN1aM1c,Stage IVC,with limited peritoneal metastasis,resected,Grade II-III, MMR normal -Diagnosed in 10/2018. Treated with right hemicolectomy.  He was found to have 2 small peritoneal metastasis during the surgery, which were removed.  He has started djuvant FOLFOX q2weeks. Tolerating well. -Labs reviewed, CBC showed Hg  12.0. CMP showed BG 255. Iron studies pending. -He is clinically stable and doing well. Will continue chemo -will repeat CT abdomen and pelvis w contrast every 3 months for the first year for close monitoring.  If no evidence of disease after 6 months of adjuvant chemo, plan to stop chemo then.  -will f/u every other treatment.  2. H/o Follicular Lymphoma  -Was treated with CHOP/Rituxan in 2010. Last f/u was in 2017. -Maintenance Rituxan at 375 mg/m2 given every 2 months  3. DM and Hypercholesterolemia -On metformin, Lipitor and metoprolol  -His blood glucose has been increased lately due to the premedication steroids -I strongly encouraged him to watch his diet, exercise, and follow-up with his primary care physician -will decrease his pre-chemo dexamethasone from 10 mg to  5 mg from next cycle  4. Financial Support -Medicaid pending -His wife is in [place]. Has 2 children and brother that help his at home  5. Insomnia  -I recommend Melatonin or Benadryl.  6. Anemia, secondary to blood loss  -previously required blood transfusion.  -Currently on oral iron. Continue. -Labs reviewed, CBC showed Hg 12.0.   Plan  -Lab reviewed, adequate for treatment, will proceed to cycle 3 chemo today and continue every 2 weeks  -f/u in 4 weeks with labs, will order restaging scan on next visit   No problem-specific Assessment & Plan notes found for this encounter.   No orders of the defined types were placed in this encounter.  All questions were answered. The patient knows to call the clinic with any problems, questions or concerns. No barriers to learning was detected. I spent 20 minutes counseling the patient face to face. The total time spent in the appointment was 25 minutes and more than 50% was on counseling and review of test results  I, Noor Dweik am acting as scribe for Dr. [name].  I have reviewed the above documentation for accuracy and completeness, and I agree with the  above.     [name], MD 12/23/2018

## 2018-12-23 ENCOUNTER — Inpatient Hospital Stay: Payer: Medicaid Other

## 2018-12-23 ENCOUNTER — Encounter: Payer: Self-pay | Admitting: Hematology

## 2018-12-23 ENCOUNTER — Inpatient Hospital Stay (HOSPITAL_BASED_OUTPATIENT_CLINIC_OR_DEPARTMENT_OTHER): Payer: Medicaid Other | Admitting: Hematology

## 2018-12-23 VITALS — BP 131/71 | HR 81 | Temp 98.2°F | Resp 18 | Ht 65.0 in | Wt 190.0 lb

## 2018-12-23 DIAGNOSIS — E119 Type 2 diabetes mellitus without complications: Secondary | ICD-10-CM

## 2018-12-23 DIAGNOSIS — D508 Other iron deficiency anemias: Secondary | ICD-10-CM | POA: Diagnosis not present

## 2018-12-23 DIAGNOSIS — C182 Malignant neoplasm of ascending colon: Secondary | ICD-10-CM | POA: Diagnosis not present

## 2018-12-23 DIAGNOSIS — G4700 Insomnia, unspecified: Secondary | ICD-10-CM | POA: Diagnosis not present

## 2018-12-23 DIAGNOSIS — E7800 Pure hypercholesterolemia, unspecified: Secondary | ICD-10-CM

## 2018-12-23 DIAGNOSIS — Z7984 Long term (current) use of oral hypoglycemic drugs: Secondary | ICD-10-CM

## 2018-12-23 DIAGNOSIS — Z87891 Personal history of nicotine dependence: Secondary | ICD-10-CM

## 2018-12-23 DIAGNOSIS — Z95828 Presence of other vascular implants and grafts: Secondary | ICD-10-CM

## 2018-12-23 DIAGNOSIS — C786 Secondary malignant neoplasm of retroperitoneum and peritoneum: Secondary | ICD-10-CM

## 2018-12-23 DIAGNOSIS — Z5111 Encounter for antineoplastic chemotherapy: Secondary | ICD-10-CM | POA: Diagnosis not present

## 2018-12-23 DIAGNOSIS — D500 Iron deficiency anemia secondary to blood loss (chronic): Secondary | ICD-10-CM

## 2018-12-23 DIAGNOSIS — Z79899 Other long term (current) drug therapy: Secondary | ICD-10-CM

## 2018-12-23 DIAGNOSIS — C8290 Follicular lymphoma, unspecified, unspecified site: Secondary | ICD-10-CM | POA: Diagnosis not present

## 2018-12-23 DIAGNOSIS — E1165 Type 2 diabetes mellitus with hyperglycemia: Secondary | ICD-10-CM

## 2018-12-23 DIAGNOSIS — C8200 Follicular lymphoma grade I, unspecified site: Secondary | ICD-10-CM

## 2018-12-23 LAB — CMP (CANCER CENTER ONLY)
ALT: 17 U/L (ref 0–44)
AST: 14 U/L — ABNORMAL LOW (ref 15–41)
Albumin: 3.3 g/dL — ABNORMAL LOW (ref 3.5–5.0)
Alkaline Phosphatase: 101 U/L (ref 38–126)
Anion gap: 6 (ref 5–15)
BUN: 20 mg/dL (ref 6–20)
CHLORIDE: 104 mmol/L (ref 98–111)
CO2: 25 mmol/L (ref 22–32)
Calcium: 9 mg/dL (ref 8.9–10.3)
Creatinine: 0.81 mg/dL (ref 0.61–1.24)
GFR, Est AFR Am: >60 mL/min (ref >60)
GFR, Estimated: >60 mL/min (ref >60)
Glucose, Bld: 255 mg/dL — ABNORMAL HIGH (ref 70–99)
Potassium: 4.5 mmol/L (ref 3.5–5.1)
Sodium: 135 mmol/L (ref 135–145)
Total Bilirubin: 0.3 mg/dL (ref 0.3–1.2)
Total Protein: 6.3 g/dL — ABNORMAL LOW (ref 6.5–8.1)

## 2018-12-23 LAB — IRON AND TIBC
IRON: 35 ug/dL — AB (ref 42–163)
Saturation Ratios: 9 % — ABNORMAL LOW (ref 20–55)
TIBC: 400 ug/dL (ref 202–409)
UIBC: 365 ug/dL (ref 117–376)

## 2018-12-23 LAB — CBC WITH DIFFERENTIAL (CANCER CENTER ONLY)
Abs Immature Granulocytes: 0.01 10*3/uL (ref 0.00–0.07)
BASOS ABS: 0 10*3/uL (ref 0.0–0.1)
Basophils Relative: 0 %
Eosinophils Absolute: 0.3 10*3/uL (ref 0.0–0.5)
Eosinophils Relative: 4 %
HCT: 36 % — ABNORMAL LOW (ref 39.0–52.0)
Hemoglobin: 12 g/dL — ABNORMAL LOW (ref 13.0–17.0)
Immature Granulocytes: 0 %
Lymphocytes Relative: 28 %
Lymphs Abs: 2 10*3/uL (ref 0.7–4.0)
MCH: 28.6 pg (ref 26.0–34.0)
MCHC: 33.3 g/dL (ref 30.0–36.0)
MCV: 85.9 fL (ref 80.0–100.0)
Monocytes Absolute: 0.9 10*3/uL (ref 0.1–1.0)
Monocytes Relative: 12 %
Neutro Abs: 4.1 10*3/uL (ref 1.7–7.7)
Neutrophils Relative %: 56 %
Platelet Count: 164 10*3/uL (ref 150–400)
RBC: 4.19 MIL/uL — ABNORMAL LOW (ref 4.22–5.81)
RDW: 14.2 % (ref 11.5–15.5)
WBC Count: 7.3 10*3/uL (ref 4.0–10.5)
nRBC: 0 % (ref 0.0–0.2)

## 2018-12-23 LAB — FERRITIN: Ferritin: 17 ng/mL — ABNORMAL LOW (ref 24–336)

## 2018-12-23 MED ORDER — SODIUM CHLORIDE 0.9 % IV SOLN
2400.0000 mg/m2 | INTRAVENOUS | Status: DC
  Administered 2018-12-23: 4750 mg via INTRAVENOUS
  Filled 2018-12-23: qty 95

## 2018-12-23 MED ORDER — PALONOSETRON HCL INJECTION 0.25 MG/5ML
INTRAVENOUS | Status: AC
  Filled 2018-12-23: qty 5

## 2018-12-23 MED ORDER — DEXAMETHASONE SODIUM PHOSPHATE 10 MG/ML IJ SOLN
10.0000 mg | Freq: Once | INTRAMUSCULAR | Status: AC
  Administered 2018-12-23: 10 mg via INTRAVENOUS

## 2018-12-23 MED ORDER — LEUCOVORIN CALCIUM INJECTION 350 MG
400.0000 mg/m2 | Freq: Once | INTRAVENOUS | Status: AC
  Administered 2018-12-23: 788 mg via INTRAVENOUS
  Filled 2018-12-23: qty 39.4

## 2018-12-23 MED ORDER — DEXTROSE 5 % IV SOLN
Freq: Once | INTRAVENOUS | Status: AC
  Administered 2018-12-23: 13:00:00 via INTRAVENOUS
  Filled 2018-12-23: qty 250

## 2018-12-23 MED ORDER — PALONOSETRON HCL INJECTION 0.25 MG/5ML
0.2500 mg | Freq: Once | INTRAVENOUS | Status: AC
  Administered 2018-12-23: 0.25 mg via INTRAVENOUS

## 2018-12-23 MED ORDER — DEXAMETHASONE SODIUM PHOSPHATE 10 MG/ML IJ SOLN
INTRAMUSCULAR | Status: AC
  Filled 2018-12-23: qty 1

## 2018-12-23 MED ORDER — OXALIPLATIN CHEMO INJECTION 100 MG/20ML
85.0000 mg/m2 | Freq: Once | INTRAVENOUS | Status: AC
  Administered 2018-12-23: 165 mg via INTRAVENOUS
  Filled 2018-12-23: qty 33

## 2018-12-23 MED ORDER — FLUOROURACIL CHEMO INJECTION 2.5 GM/50ML
400.0000 mg/m2 | Freq: Once | INTRAVENOUS | Status: AC
  Administered 2018-12-23: 800 mg via INTRAVENOUS
  Filled 2018-12-23: qty 16

## 2018-12-23 MED ORDER — SODIUM CHLORIDE 0.9% FLUSH
10.0000 mL | INTRAVENOUS | Status: DC | PRN
  Administered 2018-12-23: 10 mL
  Filled 2018-12-23: qty 10

## 2018-12-23 NOTE — Progress Notes (Signed)
Patient verbalized understanding to come on Saturday at 13:45 for pump dc appt.

## 2018-12-23 NOTE — Patient Instructions (Signed)
[facility] Discharge Instructions for Patients Receiving Chemotherapy  Today you received the following chemotherapy agents: Oxaliplatin, leucovorin, 5FU   To help prevent nausea and vomiting after your treatment, we encourage you to take your nausea medication as directed.    If you develop nausea and vomiting that is not controlled by your nausea medication, call the clinic.   BELOW ARE SYMPTOMS THAT SHOULD BE REPORTED IMMEDIATELY:  *FEVER GREATER THAN 100.5 F  *CHILLS WITH OR WITHOUT FEVER  NAUSEA AND VOMITING THAT IS NOT CONTROLLED WITH YOUR NAUSEA MEDICATION  *UNUSUAL SHORTNESS OF BREATH  *UNUSUAL BRUISING OR BLEEDING  TENDERNESS IN MOUTH AND THROAT WITH OR WITHOUT PRESENCE OF ULCERS  *URINARY PROBLEMS  *BOWEL PROBLEMS  UNUSUAL RASH Items with * indicate a potential emergency and should be followed up as soon as possible.  Feel free to call the clinic should you have any questions or concerns. The clinic phone number is [phone].  Please show the CHEMO ALERT CARD at check-in to the Emergency Department and triage nurse.   

## 2018-12-25 ENCOUNTER — Inpatient Hospital Stay: Payer: Medicaid Other

## 2018-12-25 VITALS — BP 132/72 | HR 78 | Temp 98.2°F | Resp 17

## 2018-12-25 DIAGNOSIS — Z5111 Encounter for antineoplastic chemotherapy: Secondary | ICD-10-CM | POA: Diagnosis not present

## 2018-12-25 DIAGNOSIS — C182 Malignant neoplasm of ascending colon: Secondary | ICD-10-CM

## 2018-12-25 MED ORDER — HEPARIN SOD (PORK) LOCK FLUSH 100 UNIT/ML IV SOLN
500.0000 [IU] | Freq: Once | INTRAVENOUS | Status: AC | PRN
  Administered 2018-12-25: 500 [IU]
  Filled 2018-12-25: qty 5

## 2018-12-25 MED ORDER — SODIUM CHLORIDE 0.9% FLUSH
10.0000 mL | INTRAVENOUS | Status: DC | PRN
  Administered 2018-12-25: 10 mL
  Filled 2018-12-25: qty 10

## 2019-01-06 ENCOUNTER — Inpatient Hospital Stay: Payer: Medicaid Other

## 2019-01-06 ENCOUNTER — Encounter: Payer: Self-pay | Admitting: Hematology

## 2019-01-06 ENCOUNTER — Ambulatory Visit: Payer: Self-pay | Admitting: Hematology

## 2019-01-06 ENCOUNTER — Telehealth: Payer: Self-pay

## 2019-01-06 VITALS — BP 145/70 | HR 78 | Temp 98.7°F | Resp 18

## 2019-01-06 DIAGNOSIS — Z95828 Presence of other vascular implants and grafts: Secondary | ICD-10-CM

## 2019-01-06 DIAGNOSIS — C182 Malignant neoplasm of ascending colon: Secondary | ICD-10-CM

## 2019-01-06 DIAGNOSIS — Z5111 Encounter for antineoplastic chemotherapy: Secondary | ICD-10-CM | POA: Diagnosis not present

## 2019-01-06 LAB — CBC WITH DIFFERENTIAL (CANCER CENTER ONLY)
Abs Immature Granulocytes: 0.01 10*3/uL (ref 0.00–0.07)
BASOS PCT: 0 %
Basophils Absolute: 0 10*3/uL (ref 0.0–0.1)
Eosinophils Absolute: 0.3 10*3/uL (ref 0.0–0.5)
Eosinophils Relative: 4 %
HCT: 37.6 % — ABNORMAL LOW (ref 39.0–52.0)
Hemoglobin: 12.4 g/dL — ABNORMAL LOW (ref 13.0–17.0)
Immature Granulocytes: 0 %
Lymphocytes Relative: 30 %
Lymphs Abs: 1.9 10*3/uL (ref 0.7–4.0)
MCH: 28.3 pg (ref 26.0–34.0)
MCHC: 33 g/dL (ref 30.0–36.0)
MCV: 85.8 fL (ref 80.0–100.0)
Monocytes Absolute: 0.8 10*3/uL (ref 0.1–1.0)
Monocytes Relative: 13 %
Neutro Abs: 3.3 10*3/uL (ref 1.7–7.7)
Neutrophils Relative %: 53 %
Platelet Count: 140 10*3/uL — ABNORMAL LOW (ref 150–400)
RBC: 4.38 MIL/uL (ref 4.22–5.81)
RDW: 14.5 % (ref 11.5–15.5)
WBC Count: 6.3 10*3/uL (ref 4.0–10.5)
nRBC: 0 % (ref 0.0–0.2)

## 2019-01-06 LAB — CMP (CANCER CENTER ONLY)
ALT: 22 U/L (ref 0–44)
ANION GAP: 8 (ref 5–15)
AST: 21 U/L (ref 15–41)
Albumin: 3.3 g/dL — ABNORMAL LOW (ref 3.5–5.0)
Alkaline Phosphatase: 93 U/L (ref 38–126)
BUN: 13 mg/dL (ref 6–20)
CO2: 25 mmol/L (ref 22–32)
Calcium: 9.3 mg/dL (ref 8.9–10.3)
Chloride: 105 mmol/L (ref 98–111)
Creatinine: 0.82 mg/dL (ref 0.61–1.24)
GFR, Est AFR Am: >60 mL/min (ref >60)
GFR, Estimated: >60 mL/min (ref >60)
Glucose, Bld: 213 mg/dL — ABNORMAL HIGH (ref 70–99)
Potassium: 4.3 mmol/L (ref 3.5–5.1)
Sodium: 138 mmol/L (ref 135–145)
Total Bilirubin: 0.3 mg/dL (ref 0.3–1.2)
Total Protein: 6.3 g/dL — ABNORMAL LOW (ref 6.5–8.1)

## 2019-01-06 MED ORDER — PALONOSETRON HCL INJECTION 0.25 MG/5ML
0.2500 mg | Freq: Once | INTRAVENOUS | Status: AC
  Administered 2019-01-06: 0.25 mg via INTRAVENOUS

## 2019-01-06 MED ORDER — PALONOSETRON HCL INJECTION 0.25 MG/5ML
INTRAVENOUS | Status: AC
  Filled 2019-01-06: qty 5

## 2019-01-06 MED ORDER — DEXTROSE 5 % IV SOLN
Freq: Once | INTRAVENOUS | Status: AC
  Administered 2019-01-06: 13:00:00 via INTRAVENOUS
  Filled 2019-01-06: qty 250

## 2019-01-06 MED ORDER — DEXAMETHASONE SODIUM PHOSPHATE 10 MG/ML IJ SOLN
5.0000 mg | Freq: Once | INTRAMUSCULAR | Status: AC
  Administered 2019-01-06: 5 mg via INTRAVENOUS

## 2019-01-06 MED ORDER — OXALIPLATIN CHEMO INJECTION 100 MG/20ML
85.0000 mg/m2 | Freq: Once | INTRAVENOUS | Status: AC
  Administered 2019-01-06: 165 mg via INTRAVENOUS
  Filled 2019-01-06: qty 33

## 2019-01-06 MED ORDER — SODIUM CHLORIDE 0.9 % IV SOLN
2400.0000 mg/m2 | INTRAVENOUS | Status: DC
  Administered 2019-01-06: 4750 mg via INTRAVENOUS
  Filled 2019-01-06: qty 95

## 2019-01-06 MED ORDER — FLUOROURACIL CHEMO INJECTION 2.5 GM/50ML
400.0000 mg/m2 | Freq: Once | INTRAVENOUS | Status: AC
  Administered 2019-01-06: 800 mg via INTRAVENOUS
  Filled 2019-01-06: qty 16

## 2019-01-06 MED ORDER — SODIUM CHLORIDE 0.9% FLUSH
10.0000 mL | INTRAVENOUS | Status: DC | PRN
  Administered 2019-01-06: 10 mL
  Filled 2019-01-06: qty 10

## 2019-01-06 MED ORDER — LEUCOVORIN CALCIUM INJECTION 350 MG
400.0000 mg/m2 | Freq: Once | INTRAVENOUS | Status: AC
  Administered 2019-01-06: 788 mg via INTRAVENOUS
  Filled 2019-01-06: qty 39.4

## 2019-01-06 MED ORDER — DEXAMETHASONE SODIUM PHOSPHATE 10 MG/ML IJ SOLN
INTRAMUSCULAR | Status: AC
  Filled 2019-01-06: qty 1

## 2019-01-06 NOTE — Patient Instructions (Signed)
[facility] Discharge Instructions for Patients Receiving Chemotherapy  Today you received the following chemotherapy agents :  Oxaliplatin,  Leucovorin,  Fluorouracil.  To help prevent nausea and vomiting after your treatment, we encourage you to take your nausea medication as prescribed.   If you develop nausea and vomiting that is not controlled by your nausea medication, call the clinic.   BELOW ARE SYMPTOMS THAT SHOULD BE REPORTED IMMEDIATELY:  *FEVER GREATER THAN 100.5 F  *CHILLS WITH OR WITHOUT FEVER  NAUSEA AND VOMITING THAT IS NOT CONTROLLED WITH YOUR NAUSEA MEDICATION  *UNUSUAL SHORTNESS OF BREATH  *UNUSUAL BRUISING OR BLEEDING  TENDERNESS IN MOUTH AND THROAT WITH OR WITHOUT PRESENCE OF ULCERS  *URINARY PROBLEMS  *BOWEL PROBLEMS  UNUSUAL RASH Items with * indicate a potential emergency and should be followed up as soon as possible.  Feel free to call the clinic should you have any questions or concerns. The clinic phone number is [phone].  Please show the CHEMO ALERT CARD at check-in to the Emergency Department and triage nurse.   

## 2019-01-06 NOTE — Progress Notes (Signed)
Patient came to sign grant approval paperwork. He has a copy along with the Outpatient pharmacy information to use for oral medications.  He also had questions about his Medicaid application. Read notes from hospital side and asked if he submitted bills. He states no but will bring on Wednesday.

## 2019-01-06 NOTE — Telephone Encounter (Signed)
Called to let Blake Vaughn know that he can come back to infusion. Gardiner Rhyme

## 2019-01-08 ENCOUNTER — Inpatient Hospital Stay: Payer: Medicaid Other | Attending: Hematology

## 2019-01-08 VITALS — BP 144/71 | HR 81 | Temp 98.4°F | Resp 16

## 2019-01-08 DIAGNOSIS — G4700 Insomnia, unspecified: Secondary | ICD-10-CM | POA: Insufficient documentation

## 2019-01-08 DIAGNOSIS — Z5111 Encounter for antineoplastic chemotherapy: Secondary | ICD-10-CM | POA: Insufficient documentation

## 2019-01-08 DIAGNOSIS — Z8572 Personal history of non-Hodgkin lymphomas: Secondary | ICD-10-CM | POA: Insufficient documentation

## 2019-01-08 DIAGNOSIS — E7800 Pure hypercholesterolemia, unspecified: Secondary | ICD-10-CM | POA: Diagnosis not present

## 2019-01-08 DIAGNOSIS — E119 Type 2 diabetes mellitus without complications: Secondary | ICD-10-CM | POA: Diagnosis not present

## 2019-01-08 DIAGNOSIS — D696 Thrombocytopenia, unspecified: Secondary | ICD-10-CM | POA: Diagnosis not present

## 2019-01-08 DIAGNOSIS — R51 Headache: Secondary | ICD-10-CM | POA: Insufficient documentation

## 2019-01-08 DIAGNOSIS — I10 Essential (primary) hypertension: Secondary | ICD-10-CM | POA: Insufficient documentation

## 2019-01-08 DIAGNOSIS — C182 Malignant neoplasm of ascending colon: Secondary | ICD-10-CM | POA: Diagnosis present

## 2019-01-08 DIAGNOSIS — Z87891 Personal history of nicotine dependence: Secondary | ICD-10-CM | POA: Insufficient documentation

## 2019-01-08 DIAGNOSIS — Z7984 Long term (current) use of oral hypoglycemic drugs: Secondary | ICD-10-CM | POA: Insufficient documentation

## 2019-01-08 DIAGNOSIS — C786 Secondary malignant neoplasm of retroperitoneum and peritoneum: Secondary | ICD-10-CM | POA: Insufficient documentation

## 2019-01-08 DIAGNOSIS — Z79899 Other long term (current) drug therapy: Secondary | ICD-10-CM | POA: Insufficient documentation

## 2019-01-08 DIAGNOSIS — D649 Anemia, unspecified: Secondary | ICD-10-CM | POA: Diagnosis not present

## 2019-01-08 DIAGNOSIS — E1165 Type 2 diabetes mellitus with hyperglycemia: Secondary | ICD-10-CM | POA: Diagnosis not present

## 2019-01-08 MED ORDER — HEPARIN SOD (PORK) LOCK FLUSH 100 UNIT/ML IV SOLN
500.0000 [IU] | Freq: Once | INTRAVENOUS | Status: AC | PRN
  Administered 2019-01-08: 500 [IU]
  Filled 2019-01-08: qty 5

## 2019-01-08 MED ORDER — SODIUM CHLORIDE 0.9% FLUSH
10.0000 mL | INTRAVENOUS | Status: DC | PRN
  Administered 2019-01-08: 10 mL
  Filled 2019-01-08: qty 10

## 2019-01-13 ENCOUNTER — Encounter (HOSPITAL_COMMUNITY): Payer: Self-pay

## 2019-01-13 ENCOUNTER — Emergency Department (HOSPITAL_COMMUNITY): Payer: Medicaid Other

## 2019-01-13 ENCOUNTER — Emergency Department (HOSPITAL_COMMUNITY)
Admission: EM | Admit: 2019-01-13 | Discharge: 2019-01-13 | Disposition: A | Discharge status: Home / Self Care | Payer: Medicaid Other | Attending: Emergency Medicine | Admitting: Emergency Medicine

## 2019-01-13 ENCOUNTER — Other Ambulatory Visit: Payer: Self-pay

## 2019-01-13 DIAGNOSIS — R51 Headache: Secondary | ICD-10-CM | POA: Diagnosis not present

## 2019-01-13 DIAGNOSIS — R519 Headache, unspecified: Secondary | ICD-10-CM

## 2019-01-13 DIAGNOSIS — Z7984 Long term (current) use of oral hypoglycemic drugs: Secondary | ICD-10-CM | POA: Diagnosis not present

## 2019-01-13 DIAGNOSIS — Z85038 Personal history of other malignant neoplasm of large intestine: Secondary | ICD-10-CM | POA: Diagnosis not present

## 2019-01-13 DIAGNOSIS — E119 Type 2 diabetes mellitus without complications: Secondary | ICD-10-CM | POA: Diagnosis not present

## 2019-01-13 DIAGNOSIS — Z79899 Other long term (current) drug therapy: Secondary | ICD-10-CM | POA: Insufficient documentation

## 2019-01-13 DIAGNOSIS — Z8572 Personal history of non-Hodgkin lymphomas: Secondary | ICD-10-CM | POA: Insufficient documentation

## 2019-01-13 DIAGNOSIS — I10 Essential (primary) hypertension: Secondary | ICD-10-CM | POA: Diagnosis not present

## 2019-01-13 DIAGNOSIS — Z87891 Personal history of nicotine dependence: Secondary | ICD-10-CM | POA: Diagnosis not present

## 2019-01-13 LAB — COMPREHENSIVE METABOLIC PANEL
ALT: 19 U/L (ref 0–44)
AST: 22 U/L (ref 15–41)
Albumin: 3.3 g/dL — ABNORMAL LOW (ref 3.5–5.0)
Alkaline Phosphatase: 62 U/L (ref 38–126)
Anion gap: 10 (ref 5–15)
BUN: 14 mg/dL (ref 6–20)
CO2: 22 mmol/L (ref 22–32)
Calcium: 9 mg/dL (ref 8.9–10.3)
Chloride: 104 mmol/L (ref 98–111)
Creatinine, Ser: 1.03 mg/dL (ref 0.61–1.24)
GFR calc Af Amer: >60 mL/min (ref >60)
GFR calc non Af Amer: >60 mL/min (ref >60)
Glucose, Bld: 143 mg/dL — ABNORMAL HIGH (ref 70–99)
Potassium: 3.9 mmol/L (ref 3.5–5.1)
Sodium: 136 mmol/L (ref 135–145)
Total Bilirubin: 0.5 mg/dL (ref 0.3–1.2)
Total Protein: 6.1 g/dL — ABNORMAL LOW (ref 6.5–8.1)

## 2019-01-13 LAB — CBC WITH DIFFERENTIAL/PLATELET
Abs Immature Granulocytes: 0.01 10*3/uL (ref 0.00–0.07)
Basophils Absolute: 0 10*3/uL (ref 0.0–0.1)
Basophils Relative: 0 %
Eosinophils Absolute: 0.4 10*3/uL (ref 0.0–0.5)
Eosinophils Relative: 7 %
HCT: 39.8 % (ref 39.0–52.0)
Hemoglobin: 12.9 g/dL — ABNORMAL LOW (ref 13.0–17.0)
Immature Granulocytes: 0 %
Lymphocytes Relative: 39 %
Lymphs Abs: 2.2 10*3/uL (ref 0.7–4.0)
MCH: 27.3 pg (ref 26.0–34.0)
MCHC: 32.4 g/dL (ref 30.0–36.0)
MCV: 84.3 fL (ref 80.0–100.0)
Monocytes Absolute: 0.9 10*3/uL (ref 0.1–1.0)
Monocytes Relative: 15 %
Neutro Abs: 2.2 10*3/uL (ref 1.7–7.7)
Neutrophils Relative %: 39 %
Platelets: 147 10*3/uL — ABNORMAL LOW (ref 150–400)
RBC: 4.72 MIL/uL (ref 4.22–5.81)
RDW: 13.8 % (ref 11.5–15.5)
WBC: 5.8 10*3/uL (ref 4.0–10.5)
nRBC: 0 % (ref 0.0–0.2)

## 2019-01-13 IMAGING — CT CT HEAD W/O CM
4 series · 16 of 47 positions shown, 18 images · non-contrast
Comparison: [DATE].

CLINICAL DATA: Severe headache for the past 3 days. Currently
undergoing chemotherapy for colon cancer.

EXAM:
CT HEAD WITHOUT CONTRAST
TECHNIQUE: Contiguous axial images were obtained from the base of the skull
through the vertex without intravenous contrast.

[Series 3: head without · axial · non-contrast · 0.44mm/px · z∈[-141,-21]mm · 7 of 32 slices shown, 9 images]
[im 4/32  brain]
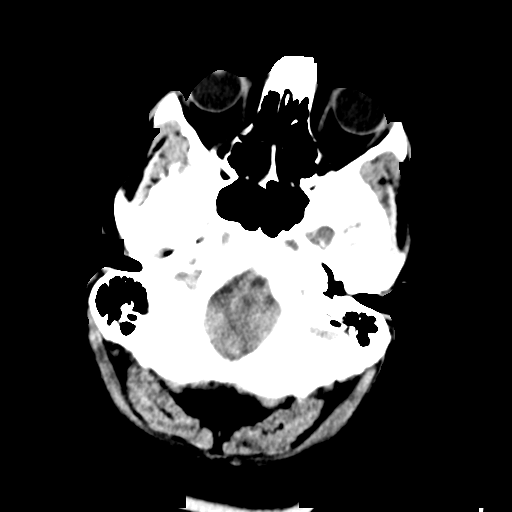
[im 4/32  bone]
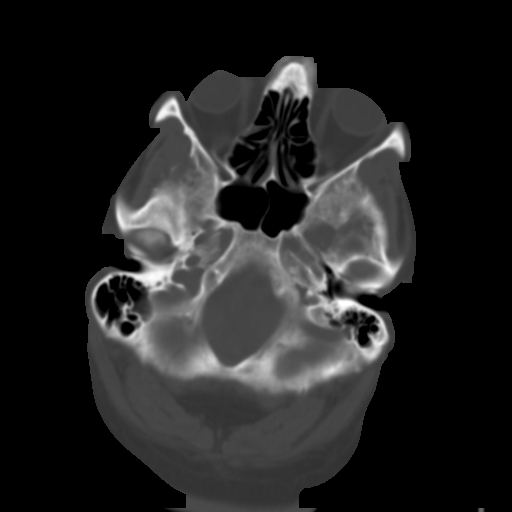
[im 8/32  brain]
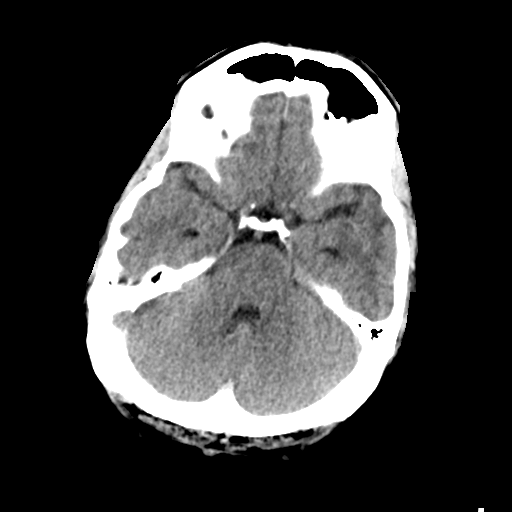
[im 12/32  brain]
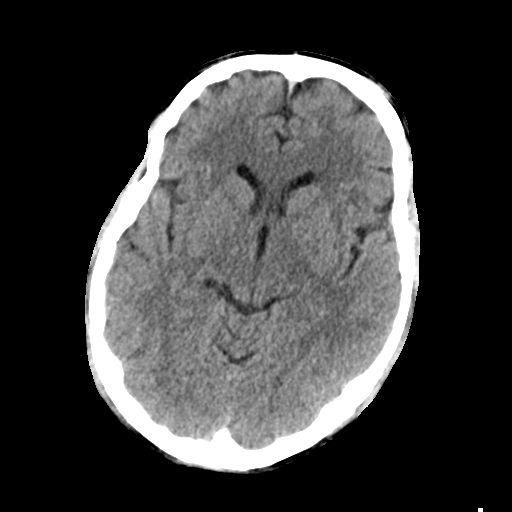
[im 16/32  brain]
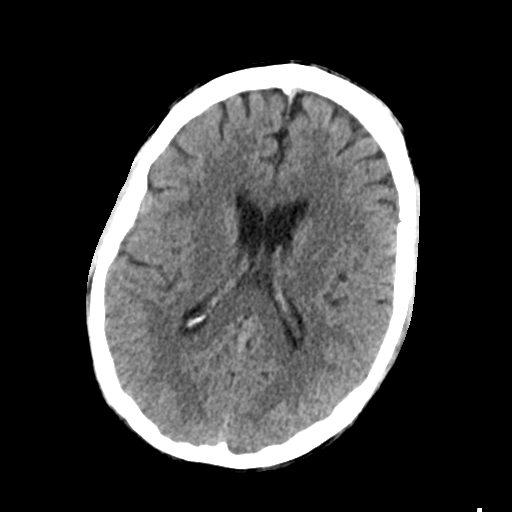
[im 20/32  brain]
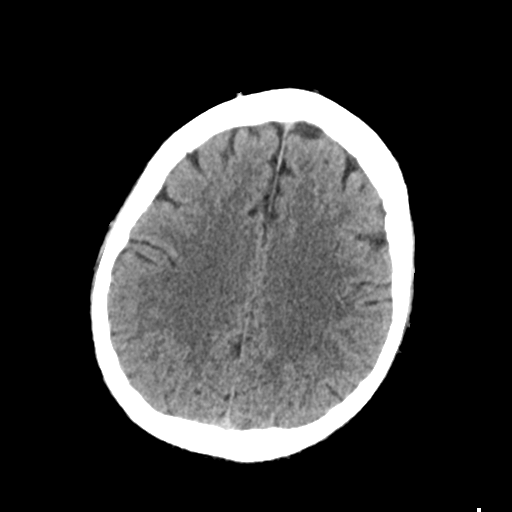
[im 20/32  bone]
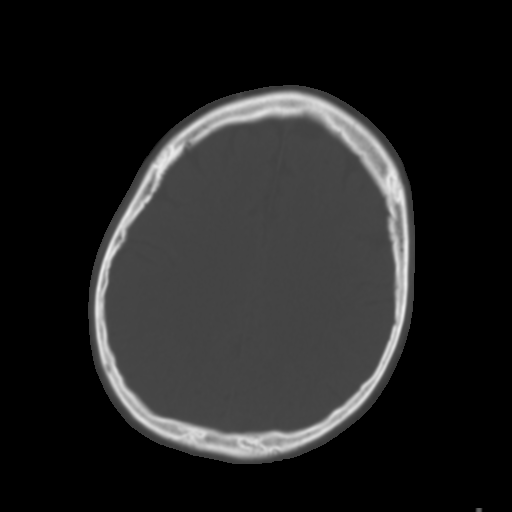
[im 24/32  brain]
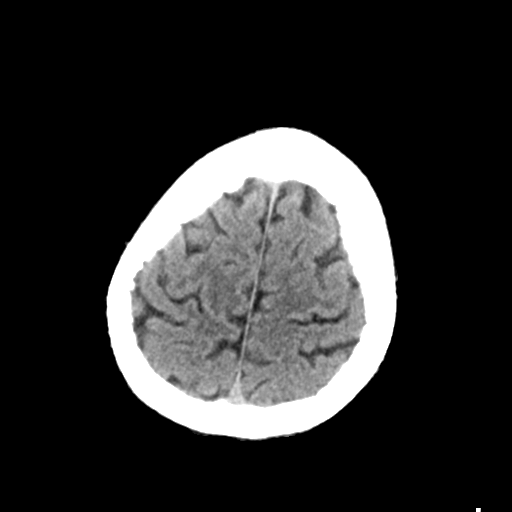
[im 28/32  brain]
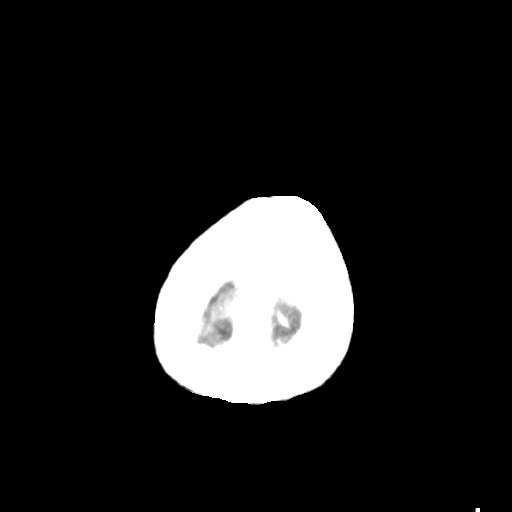

[Series 4: head bone · axial · 0.44mm/px · z∈[-142,-110]mm · 3 of 79 slices shown]
[im 8/79  bone]
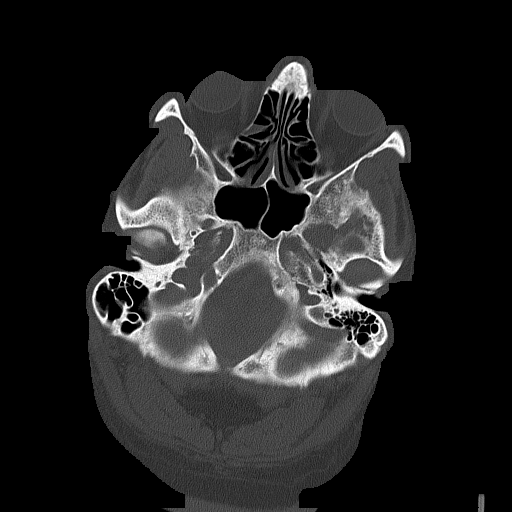
[im 16/79  bone]
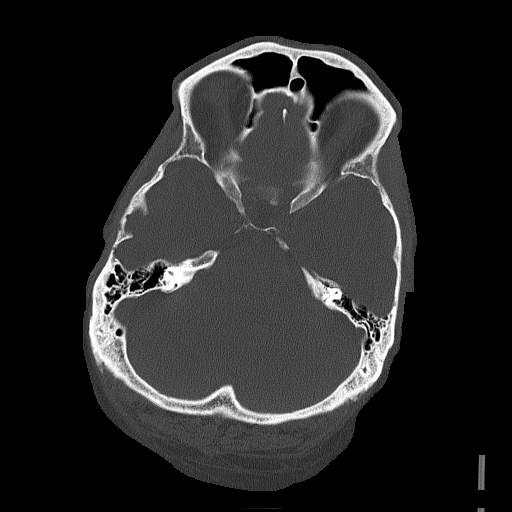
[im 24/79  bone]
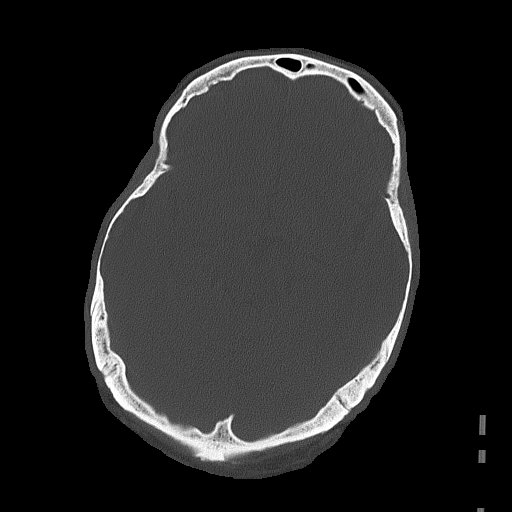

[Series 5: head without cor · coronal · non-contrast · 0.33mm/px · 3 of 68 slices shown]
[im 23/68  brain]
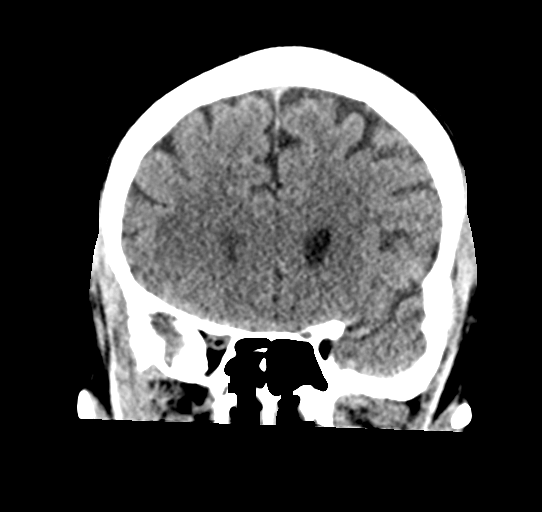
[im 30/68  brain]
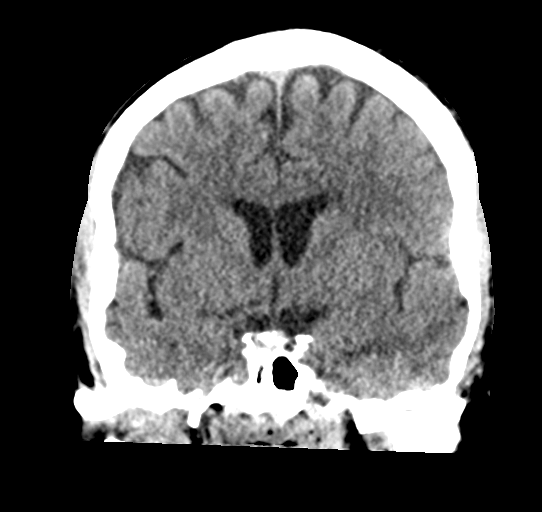
[im 38/68  brain]
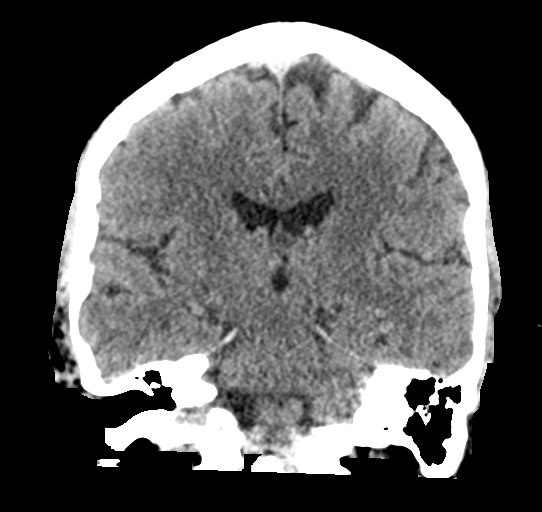

[Series 6: head without sag · sagittal · non-contrast · 0.30mm/px · 3 of 54 slices shown]
[im 18/54  brain]
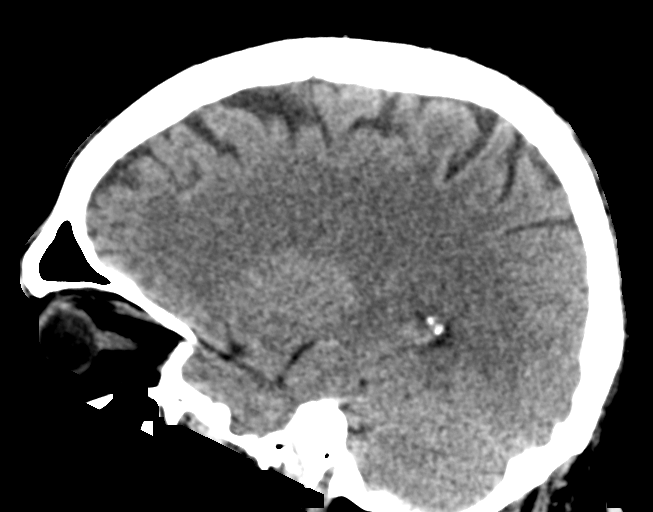
[im 27/54  brain]
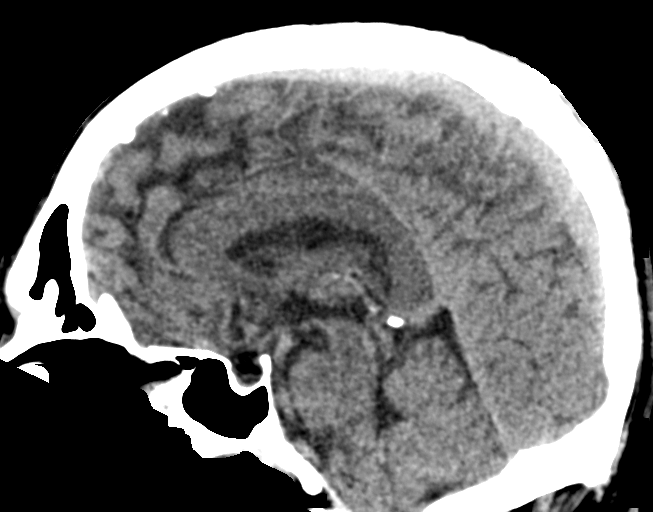
[im 36/54  brain]
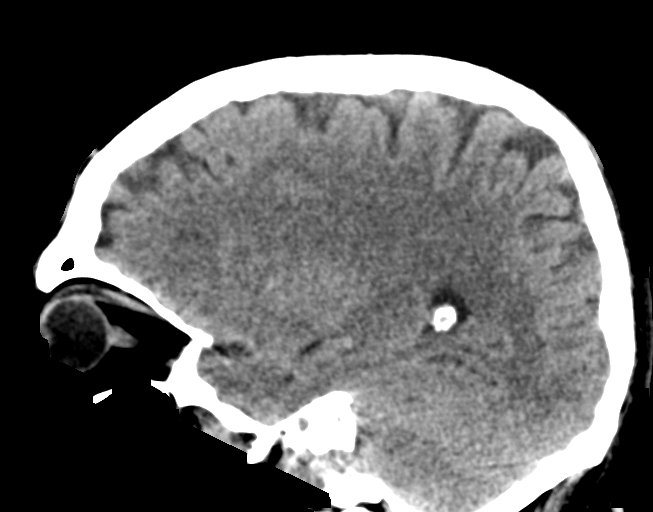

[16 of 47 positions shown; findings below may reference images not displayed]

FINDINGS: Brain: Stable minimal patchy white matter low density in both
cerebral hemispheres. Normal size and position of the ventricles. No
intracranial hemorrhage, mass lesion or CT evidence of acute
infarction.

Vascular: No hyperdense vessel or unexpected calcification.

Skull: Normal. Negative for fracture or focal lesion.

Sinuses/Orbits: Unremarkable.

Other: None.
IMPRESSION: 1. No acute abnormality.
2. Stable minimal chronic small vessel white matter ischemic changes
in both cerebral hemispheres.

## 2019-01-13 MED ORDER — METOCLOPRAMIDE HCL 5 MG/ML IJ SOLN
10.0000 mg | Freq: Once | INTRAMUSCULAR | Status: AC
  Administered 2019-01-13: 10 mg via INTRAVENOUS
  Filled 2019-01-13: qty 2

## 2019-01-13 MED ORDER — KETOROLAC TROMETHAMINE 30 MG/ML IJ SOLN
30.0000 mg | Freq: Once | INTRAMUSCULAR | Status: DC
  Filled 2019-01-13: qty 1

## 2019-01-13 MED ORDER — SODIUM CHLORIDE 0.9 % IV BOLUS
500.0000 mL | Freq: Once | INTRAVENOUS | Status: AC
  Administered 2019-01-13: 500 mL via INTRAVENOUS

## 2019-01-13 NOTE — ED Provider Notes (Signed)
[facility] EMERGENCY DEPARTMENT Provider Note   CSN: [id number] Arrival date & time: 01/13/19  1629     History   Chief Complaint Chief Complaint  Patient presents with  . Migraine    HPI Blake Vaughn is a 58 y.o. male with history of hypertension, diabetes, lymphoma, colon cancer actively receiving chemotherapy who presents with a headache the past 3 days.  It has been increasing and increasing over the past 3 days.  He reports it started gradually.  It is in the top of his head.  He denies any vision changes, numbness or tingling, weakness, chest pain, shortness of breath, abdominal pain.  He has had some nausea, but no vomiting.  He has been taking over-the-counter headache medication without relief.  He reports having a similar headache about a year ago and it eventually went away.  HPI  Past Medical History:  Diagnosis Date  . Appendicitis 10/21/2018  . Diabetes mellitus   . 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 Hypercholesterolemia   . Hypertension 11/27/2011  . Lymphoma [facility])    gets annual chemo last tx Feb 2013  . TOBACCO USE, QUIT 08/18/2009   Qualifier: Diagnosis of  By: Drue Flirt  MD, Taineisha      Patient Active Problem List   Diagnosis Date Noted  . Port-A-Cath in place 11/25/2018  . Cancer of right colon ([place]) 11/12/2018  . Hyponatremia 11/05/2018  . Syncope due to orthostatic hypotension 11/05/2018  . Acute blood loss anemia 11/05/2018  . Hyperkalemia 11/05/2018  . High blood urea nitrogen (BUN) 11/05/2018  . Hypoalbuminemia 11/05/2018  . Hypoproteinemia ([place]) 11/05/2018  . GI bleed 11/05/2018  . Acute GI bleeding 11/04/2018  . Uses Spanish as primary spoken language 10/23/2018  . Obesity (BMI 30-39.9) 10/23/2018  . Mass of cecum s/p right colectomy 10/21/2018 10/22/2018  . Nocturnal leg cramps 07/16/2017  . HTN (hypertension) 11/27/2011  . Pure  hypercholesterolemia 09/24/2009  . 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 Diabetes type 2, uncontrolled ([place]) 07/09/2009  . 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 ESOPHAGOGASTRODUODENOSCOPY (EGD) WITH PROPOFOL N/A 11/05/2018   Procedure: ESOPHAGOGASTRODUODENOSCOPY (EGD) WITH PROPOFOL;  Surgeon: [name], MD;  Location: [facility];  Service: Endoscopy;  Laterality: N/A;  . IR IMAGING GUIDED PORT INSERTION  11/24/2018  . LAPAROSCOPIC APPENDECTOMY N/A 10/21/2018   Procedure: Exploratory laparotomy right hemicolectomy;  Surgeon: [name], MD;  Location: [facility];  Service: General;  Laterality: N/A;        Home Medications    Prior to Admission medications   Medication Sig Start Date End Date Taking? Authorizing Provider  Alcohol Swabs PADS Use as instructed 09/05/16   Schorr, [name], NP  atorvastatin (LIPITOR) 10 MG tablet Take 1 tablet (10 mg total) by mouth daily. 07/16/17   [name], [name], DO  Blood Glucose Monitoring Suppl (RELION PRIME MONITOR) DEVI Use device as instructed 04/14/16   [name], DO  docusate sodium (COLACE) 100 MG capsule Take 1 capsule (100 mg total) by mouth 2 (two) times daily. 10/28/18   Meuth, [name], [name]  feeding supplement (ENSURE SURGERY) LIQD Take 237 mLs by mouth 2 (two) times daily between meals. 10/28/18   Meuth, [name], [name]  glucose blood test strip Use as instructed 04/14/16   [name], DO  lidocaine-prilocaine (  EMLA) cream Apply to affected area once 11/25/18   [name], MD  metFORMIN (GLUCOPHAGE) 1000 MG tablet TAKE 1 TABLET BY MOUTH TWICE DAILY WITH A MEAL 12/16/18   [name], [name], DO  metoprolol tartrate (LOPRESSOR) 25 MG tablet Take 0.5 tablets (12.5 mg total) by mouth 2 (two) times daily. 10/28/18   Meuth, [name], [name]  ondansetron (ZOFRAN) 8 MG tablet Take 1 tablet (8 mg total) by mouth 2 (two) times daily  as needed for refractory nausea / vomiting. Start on day 3 after chemotherapy. 11/25/18   [name], MD  polyethylene glycol [facility]) packet Take 17 g by mouth daily. 10/28/18   Meuth, [name], [name]  prochlorperazine (COMPAZINE) 10 MG tablet Take 1 tablet (10 mg total) by mouth every 6 (six) hours as needed (Nausea or vomiting). 11/25/18   [name], MD  RELION LANCETS STANDARD 21G MISC Use as instructed 04/14/16   [name], DO  saccharomyces boulardii (FLORASTOR) 250 MG capsule Take 1 capsule (250 mg total) by mouth 2 (two) times daily. 10/28/18   Meuth, Blaine Hamper, [name]    Family History Family History  Problem Relation Age of Onset  . Diabetes Mother   . Diabetes Father   . Renal Disease Brother   . Diabetes Brother   . Cancer Brother        lymphoma     Social History Social History   Tobacco Use  . Smoking status: Former Smoker    Packs/day: 0.50    Years: 30.00    Pack years: 15.00    Last attempt to quit: 12/08/2004    Years since quitting: 14.1  . Smokeless tobacco: Former Systems developer    Quit date: 11/20/2006  Substance Use Topics  . Alcohol use: No  . Drug use: No     Allergies   Iohexol   Review of Systems Review of Systems  Constitutional: Negative for chills and fever.  HENT: Negative for facial swelling and sore throat.   Eyes: Negative for visual disturbance.  Respiratory: Negative for shortness of breath.   Cardiovascular: Negative for chest pain.  Gastrointestinal: Positive for nausea. Negative for abdominal pain and vomiting.  Genitourinary: Negative for dysuria.  Musculoskeletal: Negative for back pain.  Skin: Negative for rash and wound.  Neurological: Positive for headaches. Negative for weakness and numbness.  Psychiatric/Behavioral: The patient is not nervous/anxious.      Physical Exam Updated Vital Signs BP 135/87   Pulse 76   Temp 98.1 F (36.7 C) (Oral)   Resp 18   Ht 5\' 5"  (1.651 m)   Wt 86 kg   SpO2 100%   BMI 31.55  kg/m   Physical Exam Vitals signs and nursing note reviewed.  Constitutional:      General: He is not in acute distress.    Appearance: He is well-developed. He is not diaphoretic.  HENT:     Head: Normocephalic and atraumatic.     Mouth/Throat:     Pharynx: No oropharyngeal exudate.  Eyes:     General: No scleral icterus.       Right eye: No discharge.        Left eye: No discharge.     Conjunctiva/sclera: Conjunctivae normal.     Pupils: Pupils are equal, round, and reactive to light.  Neck:     Musculoskeletal: Normal range of motion and neck supple. No neck rigidity or muscular tenderness.     Thyroid: No thyromegaly.  Cardiovascular:  Rate and Rhythm: Normal rate and regular rhythm.     Heart sounds: Normal heart sounds. No murmur. No friction rub. No gallop.   Pulmonary:     Effort: Pulmonary effort is normal. No respiratory distress.     Breath sounds: Normal breath sounds. No stridor. No wheezing or rales.  Abdominal:     General: Bowel sounds are normal. There is no distension.     Palpations: Abdomen is soft.     Tenderness: There is no abdominal tenderness. There is no guarding or rebound.  Lymphadenopathy:     Cervical: No cervical adenopathy.  Skin:    General: Skin is warm and dry.     Coloration: Skin is not pale.     Findings: No rash.  Neurological:     Mental Status: He is alert.     Coordination: Coordination normal.     Comments: CN 3-12 intact; normal sensation throughout; 5/5 strength in all 4 extremities; equal bilateral grip strength; no ataxia or dysmetria on finger-to-nose      ED Treatments / Results  Labs (all labs ordered are listed, but only abnormal results are displayed) Labs Reviewed  COMPREHENSIVE METABOLIC PANEL - Abnormal; Notable for the following components:      Result Value   Glucose, Bld 143 (*)    Total Protein 6.1 (*)    Albumin 3.3 (*)    All other components within normal limits  CBC WITH DIFFERENTIAL/PLATELET -  Abnormal; Notable for the following components:   Hemoglobin 12.9 (*)    Platelets 147 (*)    All other components within normal limits    EKG None  Radiology Ct Head Wo Contrast  Result Date: 01/13/2019 CLINICAL DATA:  Severe headache for the past 3 days. Currently undergoing chemotherapy for colon cancer. EXAM: CT HEAD WITHOUT CONTRAST TECHNIQUE: Contiguous axial images were obtained from the base of the skull through the vertex without intravenous contrast. COMPARISON:  09/09/2016. FINDINGS: Brain: Stable minimal patchy white matter low density in both cerebral hemispheres. Normal size and position of the ventricles. No intracranial hemorrhage, mass lesion or CT evidence of acute infarction. Vascular: No hyperdense vessel or unexpected calcification. Skull: Normal. Negative for fracture or focal lesion. Sinuses/Orbits: Unremarkable. Other: None. IMPRESSION: 1. No acute abnormality. 2. Stable minimal chronic small vessel white matter ischemic changes in both cerebral hemispheres. Electronically Signed   By: Claudie Revering M.D.   On: 01/13/2019 17:58    Procedures Procedures (including critical care time)  Medications Ordered in ED Medications  ketorolac (TORADOL) 30 MG/ML injection 30 mg (has no administration in time range)  metoCLOPramide (REGLAN) injection 10 mg (10 mg Intravenous Given 01/13/19 1729)  sodium chloride 0.9 % bolus 500 mL (0 mLs Intravenous Stopped 01/13/19 1838)     Initial Impression / Assessment and Plan / ED Course  I have reviewed the triage vital signs and the nursing notes.  Pertinent labs & imaging results that were available during my care of the patient were reviewed by me and considered in my medical decision making (see chart for details).     Patient presenting with a 3-day history of headache.  It began gradually.  It has been waxing and waning since onset.  His labs are unremarkable.  CT head is stable.  Patient is feeling better after IV fluids and  Reglan.  Patient is afebrile.  He has had no neck pain.  Low suspicion of any infectious cause, SAH, meningitis.  Patient to follow-up with his oncologist  as planned.  Patient advised to talk to his PCP if his headaches are continuing.  Return precautions discussed.  Patient understands and agrees with plan.  Patient vital stable throughout ED course and discharged in satisfactory condition. I discussed patient case with Dr. Eulis Foster who guided the patient's management and agrees with plan.   Final Clinical Impressions(s) / ED Diagnoses   Final diagnoses:  Bad headache    ED Discharge Orders    None       Frederica Kuster, Hershal Coria 01/13/19 2044    Daleen Bo, MD 01/13/19 2242

## 2019-01-13 NOTE — Discharge Instructions (Addendum)
Please follow up with your doctor for further evaluation and treatment of your headaches if they are returning. You can take Tylenol (acetaminophen) as prescribed over-the-counter as needed for your headache if they return.  You can also try caffeine.  Please return to the emergency department if you develop any new or worsening symptoms including severe worsening headache, neck pain, fever over 100.4 F, or any other concerning symptoms.

## 2019-01-13 NOTE — ED Triage Notes (Signed)
Pt in via VO with headache 10/10. Current receiving chemo for colon cancer. Pt tearful. PA to bedside.

## 2019-01-18 NOTE — Progress Notes (Signed)
Blake Vaughn   [phone] Fax:([phone]) [phone]   Clinic Follow up Note   Patient Care Team: [name], [name], DO as PCP - General [name], MD as Consulting Physician (Colon and Rectal Surgery) [name], MD as Consulting Physician (Hematology) 01/20/2019  CHIEF COMPLAINT: F/u on colon cancer  SUMMARY OF ONCOLOGIC HISTORY: Oncology History   Cancer Staging Cancer of right colon [facility]) Staging form: Colon and Rectum, AJCC 8th Edition - Pathologic stage from 10/21/2018: Stage IVC (pT4b, pN1a, pM1c) - Signed by [name], MD on 16/0/7371  Follicular lymphoma ([place]), History of Staging form: Lymphoid Neoplasms, AJCC 6th Edition - Clinical: Stage II - Signed by Curt Bears, MD on 0/62/6948       Follicular lymphoma [facility]), History of   11/2009 Initial Diagnosis    Follicular lymphoma ([place]), History of     Chemotherapy    1) Status post 6 cycles of systemic chemotherapy with CHOP/Rituxan last dose given 05/01/2009.  2) Maintenance Rituxan at 375 mg per meter square given every 2 months status post 12 cycles      Cancer of right colon ([place])   10/21/2018 Surgery    Exploratory laparotomy right hemicolectomy by Dr. [name] and Dr. [name]  10/21/18    10/21/2018 Pathology Results    Diagnosis 10/21/18 Colon, segmental resection for tumor, right ascending and appendix - ADENOCARCINOMA, MODERATE TO POORLY DIFFERENTIATED (4 CM) - METASTATIC CARCINOMA INVOLVING ONE OF EIGHTEEN LYMPH NODES (1/18) - CARCINOMA EXTENDS INTO THE APPENDIX - TWO TUMOR DEPOSITS PRESENT - SEE ONCOLOGY TABLE AND COMMENT BELOW    10/21/2018 Cancer Staging    Staging form: Colon and Rectum, AJCC 8th Edition - Pathologic stage from 10/21/2018: Stage IVC (pT4b, pN1a, pM1c) - Signed by [name], MD on 11/13/2018    11/04/2018 Imaging    CT AP W Contrast 11/04/18  IMPRESSION: 1. Interval appendectomy. There is residual soft tissue thickening and stranding in the  right lower quadrant adjacent to the cecum which may represent ongoing inflammation versus postsurgical changes. A small soft tissue density adjacent to the surgical sutures may reflect small hematoma or operative collection. No large focal fluid collection to suggest drainable abscess allowing for absence of contrast 2. Fluid-filled colon without wall thickening, could reflect diarrheal process    11/08/2018 Imaging    CT Chest W Contrast 11/08/18  IMPRESSION: 1. No acute findings are noted in the thorax to account for the patient's symptoms. 2. Aortic atherosclerosis, in addition to left main and 3 vessel coronary artery disease. Please note that although the presence of coronary artery calcium documents the presence of coronary artery disease, the severity of this disease and any potential stenosis cannot be assessed on this non-gated CT examination. Assessment for potential risk factor modification, dietary therapy or pharmacologic therapy may be warranted, if clinically indicated. 3. Additional incidental findings, as above. Aortic Atherosclerosis (ICD10-I70.0).    11/12/2018 Initial Diagnosis    Cancer of right colon ([place])    11/25/2018 -  Chemotherapy    FOLFOX q2weeks starting 11/25/18     CURRENT THERAPY  Adjuvant FOLFOX every 2 weeks starting12/19/19  INTERVAL HISTORY: Blake Vaughn is a 58 y.o. male who is here for follow-up. He went to the ED on 01/13/2019 due to a severe headache. Today, he is here with an interpreter on the phone. His headaches started about a week. Currently his head ache is improving and takes a pain medication that has tylenol and aspirin. The medication is helping but  he did not know it had aspirin. He takes about 1-2 pills every 2 or 3 days. Last time he had aspirin he had bleeding. He has mild nausea after chemotherapy but he can tolerate it without medication. His appetite is good and has gained about 4lbs. Last CT scan he had an allergic  reaction to the contrast was given benadryl and the reaction was resolved.    Pertinent positives and negatives of review of systems are listed and detailed within the above HPI.  REVIEW OF SYSTEMS:  Constitutional: Denies fevers, chills or abnormal weight loss, (+) head ache, (+) nausea , (+) good appetite  Eyes: Denies blurriness of vision Ears, nose, mouth, throat, and face: Denies mucositis or sore throat Respiratory: Denies cough, dyspnea or wheezes Cardiovascular: Denies palpitation, chest discomfort or lower extremity swelling Gastrointestinal:  Denies nausea, heartburn or change in bowel habits Skin: Denies abnormal skin rashes Lymphatics: Denies new lymphadenopathy or easy bruising Neurological:Denies numbness, tingling or new weaknesses Behavioral/Psych: Mood is stable, no new changes  All other systems were reviewed with the patient and are negative.  MEDICAL HISTORY:  Past Medical History:  Diagnosis Date  . Appendicitis 10/21/2018  . Diabetes mellitus   . 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 Hypercholesterolemia   . Hypertension 11/27/2011  . Lymphoma [facility])    gets annual chemo last tx Feb 2013  . TOBACCO USE, QUIT 08/18/2009   Qualifier: Diagnosis of  By: Drue Flirt  MD, Merrily Brittle      SURGICAL HISTORY: Past Surgical History:  Procedure Laterality Date  . ESOPHAGOGASTRODUODENOSCOPY (EGD) WITH PROPOFOL N/A 11/05/2018   Procedure: ESOPHAGOGASTRODUODENOSCOPY (EGD) WITH PROPOFOL;  Surgeon: [name], MD;  Location: [facility];  Service: Endoscopy;  Laterality: N/A;  . IR IMAGING GUIDED PORT INSERTION  11/24/2018  . LAPAROSCOPIC APPENDECTOMY N/A 10/21/2018   Procedure: Exploratory laparotomy right hemicolectomy;  Surgeon: [name], MD;  Location: [facility];  Service: General;  Laterality: N/A;    I have reviewed the social history and family history with the patient and they are unchanged  from previous note.  ALLERGIES:  is allergic to iohexol.  MEDICATIONS:  Current Outpatient Medications  Medication Sig Dispense Refill  . Alcohol Swabs PADS Use as instructed 100 each 6  . atorvastatin (LIPITOR) 10 MG tablet Take 1 tablet (10 mg total) by mouth daily. 90 tablet 4  . Blood Glucose Monitoring Suppl (RELION PRIME MONITOR) DEVI Use device as instructed 1 Device 0  . docusate sodium (COLACE) 100 MG capsule Take 1 capsule (100 mg total) by mouth 2 (two) times daily. 10 capsule 0  . feeding supplement (ENSURE SURGERY) LIQD Take 237 mLs by mouth 2 (two) times daily between meals.    Marland Kitchen glucose blood test strip Use as instructed 100 each 12  . lidocaine-prilocaine (EMLA) cream Apply to affected area once 30 g 3  . metFORMIN (GLUCOPHAGE) 1000 MG tablet TAKE 1 TABLET BY MOUTH TWICE DAILY WITH A MEAL 90 tablet 0  . metoprolol tartrate (LOPRESSOR) 25 MG tablet Take 0.5 tablets (12.5 mg total) by mouth 2 (two) times daily. 60 tablet 0  . ondansetron (ZOFRAN) 8 MG tablet Take 1 tablet (8 mg total) by mouth 2 (two) times daily as needed for refractory nausea / vomiting. Start on day 3 after chemotherapy. 30 tablet 1  . polyethylene glycol (MIRALAX / GLYCOLAX) packet Take 17 g by mouth daily. 14 each 0  .  prochlorperazine (COMPAZINE) 10 MG tablet Take 1 tablet (10 mg total) by mouth every 6 (six) hours as needed (Nausea or vomiting). 30 tablet 2  . RELION LANCETS STANDARD 21G MISC Use as instructed 200 each 6  . saccharomyces boulardii (FLORASTOR) 250 MG capsule Take 1 capsule (250 mg total) by mouth 2 (two) times daily.     No current facility-administered medications for this visit.     PHYSICAL EXAMINATION: ECOG PERFORMANCE STATUS: 1 - Symptomatic but completely ambulatory  Vitals:   01/20/19 0851  BP: (!) 142/75  Pulse: 78  Resp: 17  Temp: 98 F (36.7 C)  SpO2: 100%   Filed Weights   01/20/19 0851  Weight: 190 lb 9.6 oz (86.5 kg)    GENERAL:alert, no distress and  comfortable SKIN: skin color, texture, turgor are normal, no rashes or significant lesions EYES: normal, Conjunctiva are pink and non-injected, sclera clear OROPHARYNX:no exudate, no erythema and lips, buccal mucosa, and tongue normal  NECK: supple, thyroid normal size, non-tender, without nodularity LYMPH:  no palpable lymphadenopathy in the cervical, axillary or inguinal LUNGS: clear to auscultation and percussion with normal breathing effort HEART: regular rate & rhythm and no murmurs and no lower extremity edema ABDOMEN:abdomen soft, non-tender and normal bowel sounds Musculoskeletal:no cyanosis of digits and no clubbing  NEURO: alert & oriented x 3 with fluent speech, no focal motor/sensory deficits  LABORATORY DATA:  I have reviewed the data as listed CBC Latest Ref Rng & Units 01/20/2019 01/13/2019 01/06/2019  WBC 4.0 - 10.5 K/uL 5.7 5.8 6.3  Hemoglobin 13.0 - 17.0 g/dL 12.7(L) 12.9(L) 12.4(L)  Hematocrit 39.0 - 52.0 % 38.9(L) 39.8 37.6(L)  Platelets 150 - 400 K/uL 124(L) 147(L) 140(L)     CMP Latest Ref Rng & Units 01/20/2019 01/13/2019 01/06/2019  Glucose 70 - 99 mg/dL 245(H) 143(H) 213(H)  BUN 6 - 20 mg/dL _0 Creatinine 0.61 - 1.24 mg/dL 0.95 1.03 0.82  Sodium 135 - 145 mmol/L 138 136 138  Potassium 3.5 - 5.1 mmol/L 4.3 3.9 4.3  Chloride 98 - 111 mmol/L 107 104 105  CO2 22 - 32 mmol/L _1 Calcium 8.9 - 10.3 mg/dL 9.1 9.0 9.3  Total Protein 6.5 - 8.1 g/dL 6.2(L) 6.1(L) 6.3(L)  Total Bilirubin 0.3 - 1.2 mg/dL 0.3 0.5 0.3  Alkaline Phos 38 - 126 U/L 88 62 93  AST 15 - 41 U/L _2 ALT 0 - 44 U/L _3 RADIOGRAPHIC STUDIES: I have personally reviewed the radiological images as listed and agreed with the findings in the report. No results found.   ASSESSMENT & PLAN:  Blake Vaughn is a 58 y.o. male with history of  1.RightCecal adenocarcinoma,pT4bN1aM1c,Stage IVC,with limited peritoneal metastasis,resected,Grade II-III, MMR  normal -Diagnosed in 10/2018. Treated with right hemicolectomy.  He was found to have 2 small peritoneal metastasis during the surgery, which were removed.   -He has started djuvant FOLFOX q2weeks. Tolerating well. - I will repeat CT abdomen and pelvis w contrast every 3-6 months for the first year for close monitoring.  If no evidence of disease after 6 months of adjuvant chemo, plan to stop chemo then.  -Labs reviewed,  CBC and CMP unremarkable except hyperglycemia, mild thrombocytopenia, adequate for treatment, proceed with cycle 5 FOLFOX today and continue every 2 weeks.  2. H/o Follicular Lymphoma  -Was treated with CHOP/Rituxan in 2010. Last f/u was in 2017. -Maintenance Rituxan at 375 mg/m2 given  every 2 months  3. DM and Hypercholesterolemia -On metformin, Lipitor and metoprolol  -His blood glucose has been increased lately due to the premedication steroids -I previously decreased his pre-chemo dexamethasone from 10 mg to 5 mg from next cycle -F/u with PCP, I encouraged patient to monitor his sugar at home.  4. Financial Support -Medicaid pending -His wife is in [place]. Has 2 children and brother that help his at home  5. Insomnia -I previously recommend Melatonin or Benadryl.  6. Anemia secondary to blood loss and chemo  -previously required blood transfusion.  -Takes oral iron. -Labs reviewed,Hg 12.7, stable   7. Mild thrombocytopenia -secondary to chemotherapy, will monitor  8. Headaches  -possible related to chemo or zofran -I encouraged him to use Compazine, less Zofran for nausea -Patient had a CT head without contrast in the ED, which was unremarkable -We will monitor  Plan  - I refilled compazine  -Labs reviewed, adequate for treatment, will proceed with FOLFOX today and every 2 weeks  -f/u in 4 weeks with labs -CT abdomen and pelvis w contrast in 3-4 weeks  -See APP or me before next chemo in 2 weeks  No problem-specific Assessment & Plan notes found  for this encounter.   No orders of the defined types were placed in this encounter.  All questions were answered. The patient knows to call the clinic with any problems, questions or concerns. No barriers to learning was detected. I spent 20 minutes counseling the patient face to face. The total time spent in the appointment was 25 minutes and more than 50% was on counseling and review of test results  I, Manson Allan am acting as scribe for Dr. [name].  I have reviewed the above documentation for accuracy and completeness, and I agree with the above.     [name], MD 01/20/2019

## 2019-01-20 ENCOUNTER — Inpatient Hospital Stay (HOSPITAL_BASED_OUTPATIENT_CLINIC_OR_DEPARTMENT_OTHER): Payer: Medicaid Other | Admitting: Hematology

## 2019-01-20 ENCOUNTER — Inpatient Hospital Stay: Payer: Medicaid Other

## 2019-01-20 ENCOUNTER — Encounter: Payer: Self-pay | Admitting: Hematology

## 2019-01-20 VITALS — BP 142/75 | HR 78 | Temp 98.0°F | Resp 17 | Ht 65.0 in | Wt 190.6 lb

## 2019-01-20 DIAGNOSIS — D696 Thrombocytopenia, unspecified: Secondary | ICD-10-CM

## 2019-01-20 DIAGNOSIS — C182 Malignant neoplasm of ascending colon: Secondary | ICD-10-CM

## 2019-01-20 DIAGNOSIS — Z8572 Personal history of non-Hodgkin lymphomas: Secondary | ICD-10-CM

## 2019-01-20 DIAGNOSIS — C786 Secondary malignant neoplasm of retroperitoneum and peritoneum: Secondary | ICD-10-CM

## 2019-01-20 DIAGNOSIS — C8200 Follicular lymphoma grade I, unspecified site: Secondary | ICD-10-CM

## 2019-01-20 DIAGNOSIS — R51 Headache: Secondary | ICD-10-CM | POA: Diagnosis not present

## 2019-01-20 DIAGNOSIS — D649 Anemia, unspecified: Secondary | ICD-10-CM | POA: Diagnosis not present

## 2019-01-20 DIAGNOSIS — D500 Iron deficiency anemia secondary to blood loss (chronic): Secondary | ICD-10-CM

## 2019-01-20 DIAGNOSIS — Z5111 Encounter for antineoplastic chemotherapy: Secondary | ICD-10-CM | POA: Diagnosis not present

## 2019-01-20 DIAGNOSIS — Z95828 Presence of other vascular implants and grafts: Secondary | ICD-10-CM

## 2019-01-20 LAB — CMP (CANCER CENTER ONLY)
ALT: 21 U/L (ref 0–44)
AST: 22 U/L (ref 15–41)
Albumin: 3.2 g/dL — ABNORMAL LOW (ref 3.5–5.0)
Alkaline Phosphatase: 88 U/L (ref 38–126)
Anion gap: 7 (ref 5–15)
BUN: 14 mg/dL (ref 6–20)
CO2: 24 mmol/L (ref 22–32)
Calcium: 9.1 mg/dL (ref 8.9–10.3)
Chloride: 107 mmol/L (ref 98–111)
Creatinine: 0.95 mg/dL (ref 0.61–1.24)
GFR, Est AFR Am: >60 mL/min (ref >60)
GFR, Estimated: >60 mL/min (ref >60)
Glucose, Bld: 245 mg/dL — ABNORMAL HIGH (ref 70–99)
Potassium: 4.3 mmol/L (ref 3.5–5.1)
Sodium: 138 mmol/L (ref 135–145)
TOTAL PROTEIN: 6.2 g/dL — AB (ref 6.5–8.1)
Total Bilirubin: 0.3 mg/dL (ref 0.3–1.2)

## 2019-01-20 LAB — CBC WITH DIFFERENTIAL (CANCER CENTER ONLY)
Abs Immature Granulocytes: 0.01 10*3/uL (ref 0.00–0.07)
Basophils Absolute: 0 10*3/uL (ref 0.0–0.1)
Basophils Relative: 0 %
Eosinophils Absolute: 0.3 10*3/uL (ref 0.0–0.5)
Eosinophils Relative: 5 %
HCT: 38.9 % — ABNORMAL LOW (ref 39.0–52.0)
Hemoglobin: 12.7 g/dL — ABNORMAL LOW (ref 13.0–17.0)
Immature Granulocytes: 0 %
Lymphocytes Relative: 32 %
Lymphs Abs: 1.8 10*3/uL (ref 0.7–4.0)
MCH: 27.7 pg (ref 26.0–34.0)
MCHC: 32.6 g/dL (ref 30.0–36.0)
MCV: 84.9 fL (ref 80.0–100.0)
Monocytes Absolute: 0.6 10*3/uL (ref 0.1–1.0)
Monocytes Relative: 11 %
NEUTROS ABS: 2.9 10*3/uL (ref 1.7–7.7)
Neutrophils Relative %: 52 %
Platelet Count: 124 10*3/uL — ABNORMAL LOW (ref 150–400)
RBC: 4.58 MIL/uL (ref 4.22–5.81)
RDW: 14.9 % (ref 11.5–15.5)
WBC Count: 5.7 10*3/uL (ref 4.0–10.5)
nRBC: 0 % (ref 0.0–0.2)

## 2019-01-20 LAB — IRON AND TIBC
Iron: 33 ug/dL — ABNORMAL LOW (ref 42–163)
Saturation Ratios: 7 % — ABNORMAL LOW (ref 20–55)
TIBC: 470 ug/dL — ABNORMAL HIGH (ref 202–409)
UIBC: 437 ug/dL — AB (ref 117–376)

## 2019-01-20 LAB — CEA (IN HOUSE-CHCC): CEA (CHCC-In House): 5.92 ng/mL — ABNORMAL HIGH (ref 0.00–5.00)

## 2019-01-20 LAB — FERRITIN: Ferritin: 15 ng/mL — ABNORMAL LOW (ref 24–336)

## 2019-01-20 MED ORDER — PALONOSETRON HCL INJECTION 0.25 MG/5ML
INTRAVENOUS | Status: AC
  Filled 2019-01-20: qty 5

## 2019-01-20 MED ORDER — FLUOROURACIL CHEMO INJECTION 2.5 GM/50ML
400.0000 mg/m2 | Freq: Once | INTRAVENOUS | Status: AC
  Administered 2019-01-20: 800 mg via INTRAVENOUS
  Filled 2019-01-20: qty 16

## 2019-01-20 MED ORDER — DEXTROSE 5 % IV SOLN
Freq: Once | INTRAVENOUS | Status: AC
  Administered 2019-01-20: 10:00:00 via INTRAVENOUS
  Filled 2019-01-20: qty 250

## 2019-01-20 MED ORDER — SODIUM CHLORIDE 0.9% FLUSH
10.0000 mL | INTRAVENOUS | Status: DC | PRN
  Administered 2019-01-20: 10 mL
  Filled 2019-01-20: qty 10

## 2019-01-20 MED ORDER — DEXAMETHASONE SODIUM PHOSPHATE 10 MG/ML IJ SOLN
5.0000 mg | Freq: Once | INTRAMUSCULAR | Status: AC
  Administered 2019-01-20: 5 mg via INTRAVENOUS

## 2019-01-20 MED ORDER — PALONOSETRON HCL INJECTION 0.25 MG/5ML
0.2500 mg | Freq: Once | INTRAVENOUS | Status: AC
  Administered 2019-01-20: 0.25 mg via INTRAVENOUS

## 2019-01-20 MED ORDER — PROCHLORPERAZINE MALEATE 10 MG PO TABS: 10.0000 mg | ORAL_TABLET | Freq: Four times a day (QID) | ORAL | 2 refills | Status: DC | PRN

## 2019-01-20 MED ORDER — LEUCOVORIN CALCIUM INJECTION 350 MG
400.0000 mg/m2 | Freq: Once | INTRAVENOUS | Status: AC
  Administered 2019-01-20: 788 mg via INTRAVENOUS
  Filled 2019-01-20: qty 39.4

## 2019-01-20 MED ORDER — SODIUM CHLORIDE 0.9 % IV SOLN
2400.0000 mg/m2 | INTRAVENOUS | Status: DC
  Administered 2019-01-20: 4750 mg via INTRAVENOUS
  Filled 2019-01-20: qty 95

## 2019-01-20 MED ORDER — DEXAMETHASONE SODIUM PHOSPHATE 10 MG/ML IJ SOLN
INTRAMUSCULAR | Status: AC
  Filled 2019-01-20: qty 1

## 2019-01-20 MED ORDER — OXALIPLATIN CHEMO INJECTION 100 MG/20ML
85.0000 mg/m2 | Freq: Once | INTRAVENOUS | Status: AC
  Administered 2019-01-20: 165 mg via INTRAVENOUS
  Filled 2019-01-20: qty 33

## 2019-01-20 MED FILL — PROCHLORPERAZINE 10 MG TAB: 10 | 7 days supply | Qty: 30 | Fill #0

## 2019-01-20 NOTE — Patient Instructions (Signed)
[facility] Discharge Instructions for Patients Receiving Chemotherapy  Today you received the following chemotherapy agents :  Oxaliplatin,  Leucovorin,  Fluorouracil.  To help prevent nausea and vomiting after your treatment, we encourage you to take your nausea medication as prescribed.   If you develop nausea and vomiting that is not controlled by your nausea medication, call the clinic.   BELOW ARE SYMPTOMS THAT SHOULD BE REPORTED IMMEDIATELY:  *FEVER GREATER THAN 100.5 F  *CHILLS WITH OR WITHOUT FEVER  NAUSEA AND VOMITING THAT IS NOT CONTROLLED WITH YOUR NAUSEA MEDICATION  *UNUSUAL SHORTNESS OF BREATH  *UNUSUAL BRUISING OR BLEEDING  TENDERNESS IN MOUTH AND THROAT WITH OR WITHOUT PRESENCE OF ULCERS  *URINARY PROBLEMS  *BOWEL PROBLEMS  UNUSUAL RASH Items with * indicate a potential emergency and should be followed up as soon as possible.  Feel free to call the clinic should you have any questions or concerns. The clinic phone number is [phone].  Please show the CHEMO ALERT CARD at check-in to the Emergency Department and triage nurse.   

## 2019-01-21 ENCOUNTER — Encounter: Payer: Self-pay | Admitting: Hematology

## 2019-01-21 ENCOUNTER — Other Ambulatory Visit: Payer: Self-pay

## 2019-01-21 ENCOUNTER — Telehealth: Payer: Self-pay | Admitting: Hematology

## 2019-01-21 DIAGNOSIS — C182 Malignant neoplasm of ascending colon: Secondary | ICD-10-CM

## 2019-01-21 MED ORDER — ONDANSETRON HCL 8 MG PO TABS: 8.0000 mg | ORAL_TABLET | Freq: Two times a day (BID) | ORAL | 1 refills | Status: DC | PRN

## 2019-01-21 NOTE — Telephone Encounter (Signed)
Scheduled appt per 2/13 los.  Almyra Free the interpreter spoke to the patient for me when I called.  Patient aware of appt date and time and he will also come and pick up contrast before his CT appt.

## 2019-01-22 ENCOUNTER — Inpatient Hospital Stay: Payer: Medicaid Other

## 2019-01-22 VITALS — BP 135/76 | HR 81 | Temp 98.6°F | Resp 18

## 2019-01-22 DIAGNOSIS — Z5111 Encounter for antineoplastic chemotherapy: Secondary | ICD-10-CM | POA: Diagnosis not present

## 2019-01-22 DIAGNOSIS — C182 Malignant neoplasm of ascending colon: Secondary | ICD-10-CM

## 2019-01-22 MED ORDER — SODIUM CHLORIDE 0.9% FLUSH
10.0000 mL | INTRAVENOUS | Status: DC | PRN
  Administered 2019-01-22: 10 mL
  Filled 2019-01-22: qty 10

## 2019-01-22 MED ORDER — HEPARIN SOD (PORK) LOCK FLUSH 100 UNIT/ML IV SOLN
500.0000 [IU] | Freq: Once | INTRAVENOUS | Status: AC | PRN
  Administered 2019-01-22: 500 [IU]
  Filled 2019-01-22: qty 5

## 2019-01-22 NOTE — Patient Instructions (Signed)
[facility] Discharge Instructions for Patients Receiving Chemotherapy  Today you completed 5fu. To help prevent nausea and vomiting after your treatment, we encourage you to take your nausea medicationIf you develop nausea and vomiting that is not controlled by your nausea medication, call the clinic.   BELOW ARE SYMPTOMS THAT SHOULD BE REPORTED IMMEDIATELY:  *FEVER GREATER THAN 100.5 F  *CHILLS WITH OR WITHOUT FEVER  NAUSEA AND VOMITING THAT IS NOT CONTROLLED WITH YOUR NAUSEA MEDICATION  *UNUSUAL SHORTNESS OF BREATH  *UNUSUAL BRUISING OR BLEEDING  TENDERNESS IN MOUTH AND THROAT WITH OR WITHOUT PRESENCE OF ULCERS  *URINARY PROBLEMS  *BOWEL PROBLEMS  UNUSUAL RASH Items with * indicate a potential emergency and should be followed up as soon as possible.  Feel free to call the clinic should you have any questions or concerns. The clinic phone number is [phone].  Please show the [place] at check-in to the Emergency Department and triage nurse.

## 2019-01-23 ENCOUNTER — Other Ambulatory Visit: Payer: Self-pay | Admitting: Hematology

## 2019-02-02 ENCOUNTER — Encounter: Payer: Self-pay | Admitting: Medical

## 2019-02-02 ENCOUNTER — Other Ambulatory Visit: Payer: Self-pay | Admitting: Medical

## 2019-02-02 ENCOUNTER — Inpatient Hospital Stay: Payer: Medicaid Other

## 2019-02-02 ENCOUNTER — Inpatient Hospital Stay (HOSPITAL_BASED_OUTPATIENT_CLINIC_OR_DEPARTMENT_OTHER): Payer: Medicaid Other | Admitting: Medical

## 2019-02-02 VITALS — BP 137/81 | HR 88 | Temp 97.8°F | Resp 18 | Ht 65.0 in | Wt 188.1 lb

## 2019-02-02 DIAGNOSIS — E1165 Type 2 diabetes mellitus with hyperglycemia: Secondary | ICD-10-CM

## 2019-02-02 DIAGNOSIS — C182 Malignant neoplasm of ascending colon: Secondary | ICD-10-CM

## 2019-02-02 DIAGNOSIS — Z5111 Encounter for antineoplastic chemotherapy: Secondary | ICD-10-CM | POA: Diagnosis not present

## 2019-02-02 DIAGNOSIS — Z91041 Radiographic dye allergy status: Secondary | ICD-10-CM

## 2019-02-02 DIAGNOSIS — Z95828 Presence of other vascular implants and grafts: Secondary | ICD-10-CM

## 2019-02-02 LAB — CMP (CANCER CENTER ONLY)
ALT: 25 U/L (ref 0–44)
AST: 23 U/L (ref 15–41)
Albumin: 3.2 g/dL — ABNORMAL LOW (ref 3.5–5.0)
Alkaline Phosphatase: 114 U/L (ref 38–126)
Anion gap: 9 (ref 5–15)
BUN: 13 mg/dL (ref 6–20)
CO2: 23 mmol/L (ref 22–32)
Calcium: 9.3 mg/dL (ref 8.9–10.3)
Chloride: 102 mmol/L (ref 98–111)
Creatinine: 0.92 mg/dL (ref 0.61–1.24)
GFR, Est AFR Am: >60 mL/min (ref >60)
GFR, Estimated: >60 mL/min (ref >60)
Glucose, Bld: 337 mg/dL — ABNORMAL HIGH (ref 70–99)
Potassium: 4.6 mmol/L (ref 3.5–5.1)
Sodium: 134 mmol/L — ABNORMAL LOW (ref 135–145)
Total Bilirubin: 0.4 mg/dL (ref 0.3–1.2)
Total Protein: 6.4 g/dL — ABNORMAL LOW (ref 6.5–8.1)

## 2019-02-02 LAB — CBC WITH DIFFERENTIAL (CANCER CENTER ONLY)
Abs Immature Granulocytes: 0.01 10*3/uL (ref 0.00–0.07)
Basophils Absolute: 0 10*3/uL (ref 0.0–0.1)
Basophils Relative: 0 %
Eosinophils Absolute: 0.3 10*3/uL (ref 0.0–0.5)
Eosinophils Relative: 4 %
HCT: 40.6 % (ref 39.0–52.0)
Hemoglobin: 13.5 g/dL (ref 13.0–17.0)
Immature Granulocytes: 0 %
Lymphocytes Relative: 26 %
Lymphs Abs: 1.6 10*3/uL (ref 0.7–4.0)
MCH: 28.1 pg (ref 26.0–34.0)
MCHC: 33.3 g/dL (ref 30.0–36.0)
MCV: 84.6 fL (ref 80.0–100.0)
Monocytes Absolute: 0.8 10*3/uL (ref 0.1–1.0)
Monocytes Relative: 14 %
NRBC: 0 % (ref 0.0–0.2)
Neutro Abs: 3.2 10*3/uL (ref 1.7–7.7)
Neutrophils Relative %: 56 %
Platelet Count: 102 10*3/uL — ABNORMAL LOW (ref 150–400)
RBC: 4.8 MIL/uL (ref 4.22–5.81)
RDW: 15.8 % — ABNORMAL HIGH (ref 11.5–15.5)
WBC Count: 5.9 10*3/uL (ref 4.0–10.5)

## 2019-02-02 MED ORDER — SODIUM CHLORIDE 0.9% FLUSH
10.0000 mL | INTRAVENOUS | Status: DC | PRN
  Administered 2019-02-02: 10 mL
  Filled 2019-02-02: qty 10

## 2019-02-02 MED ORDER — METHYLPREDNISOLONE 32 MG PO TABS: ORAL_TABLET | ORAL | 0 refills | Status: DC

## 2019-02-02 NOTE — Progress Notes (Signed)
[facility] Progress Note   Blake Vaughn [id number] Apr 06, 1961 58 y.o.  Blake Vaughn is managed by Dr. 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  We will proceed with the patient his treatment today.  He will return on 02/10/2019 for restaging CT scans and will see Dr. Burr Medico in follow-up on 02/17/2019.  Contrast allergy: The patient was given a prescription for Medrol 32 mg with instructions to take 1 dose 12 hours and 2 hours prior to receiving IV contrast dye.  He was also told to watch his blood sugar closely at that time.  Uncontrolled type 2 diabetes: A chemistry panel returned today with a glucose of 337.  The patient was instructed to increase his metformin to 2500 mg daily.  He will take 1000 mg in the morning 1000 mg in the evening and 500 mg in the afternoon.  He will also see his primary care provider on 02/08/2019.  Please see After Visit Summary for patient specific instructions.  Future Appointments  Date Time Provider [place]  02/10/2019 10:00 AM [facility]-CT 2 [facility]-CT [place]  02/17/2019  8:30 AM [facility] LAB 6 [facility] None  02/17/2019  8:45 AM [facility] [place] FLUSH [facility] None  02/17/2019  9:15 AM [name], MD [facility] None  02/17/2019 10:00 AM [facility] INFUSION [facility] None  02/19/2019  2:00 PM [facility] INFUSION [facility] None  03/03/2019  8:15 AM [facility] LAB 6 [facility] None  03/03/2019  8:30 AM [facility] [place] FLUSH  [facility] None  03/03/2019  9:30 AM [facility] INFUSION [facility] None  03/05/2019  1:30 PM [facility] INFUSION [facility] None    No orders of the defined types were placed in this encounter.      Subjective:   Patient ID:  Blake Vaughn is a 58 y.o. (DOB 06-23-1961) male.  Chief Complaint: No chief complaint on file.   HPI Blake Vaughn is a 58 year old male with a history of a metastatic adenocarcinoma of the right cecum with peritoneal metastasis.  The patient is managed by Dr. Burr Medico.  He presents to clinic today for cycle 6 of FOLFOX.  He is scheduled to have restaging CT scans completed on 02/10/2019.  He has an allergy to IV contrast and needs to have a premed of Medrol prior to his scans.  Patient has a history of uncontrolled diabetes.  He expresses knowledge that he needs to check his blood sugar more closely while he is on Medrol.  He has an appointment with his primary care provider on 02/08/2019.  He denies fevers, chills, sweats, headaches, nausea, vomiting, constipation, or diarrhea.  Medications: I have reviewed the patient's current medications.  Allergies:  Allergies  Allergen Reactions  . Iohexol Other (See Comments)     Code: HIVES, Desc: pt had itching 8 hrs after 6/10 scan;and now was today 08/16/09 was given 50mg  benadryl 1 hr before scan and he did fine.Amy Rogal, Onset Date: [id number] *PLEASE HAVE PT TAKE 50MG  OF BENADRYL 1HR PRIOR TO SCAN*     Past Medical History:  Diagnosis Date  .  Appendicitis 10/21/2018  . Diabetes mellitus   . 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 Hypercholesterolemia   . Hypertension 11/27/2011  . Lymphoma [facility])    gets annual chemo last tx Feb 2013  . TOBACCO USE, QUIT 08/18/2009   Qualifier: Diagnosis of  By: Drue Flirt  MD, Merrily Brittle      Past Surgical History:  Procedure Laterality Date  . ESOPHAGOGASTRODUODENOSCOPY (EGD) WITH PROPOFOL N/A 11/05/2018     Procedure: ESOPHAGOGASTRODUODENOSCOPY (EGD) WITH PROPOFOL;  Surgeon: [name], MD;  Location: [facility];  Service: Endoscopy;  Laterality: N/A;  . IR IMAGING GUIDED PORT INSERTION  11/24/2018  . LAPAROSCOPIC APPENDECTOMY N/A 10/21/2018   Procedure: Exploratory laparotomy right hemicolectomy;  Surgeon: [name], MD;  Location[facility] OR;  Service: General;  Laterality: N/A;    Family History  Problem Relation Age of Onset  . Diabetes Mother   . Diabetes Father   . Renal Disease Brother   . Diabetes Brother   . Cancer Brother        lymphoma     Social History   Socioeconomic History  . Marital status: Married    Spouse name: Not on file  . Number of children: 2  . Years of education: Not on file  . Highest education level: Not on file  Occupational History  . Not on file  Social Needs  . Financial resource strain: Not on file  . Food insecurity:    Worry: Not on file    Inability: Not on file  . Transportation needs:    Medical: Not on file    Non-medical: Not on file  Tobacco Use  . Smoking status: Former Smoker    Packs/day: 0.50    Years: 30.00    Pack years: 15.00    Last attempt to quit: 12/08/2004    Years since quitting: 14.1  . Smokeless tobacco: Former Systems developer    Quit date: 11/20/2006  Substance and Sexual Activity  . Alcohol use: No  . Drug use: No  . Sexual activity: Not on file  Lifestyle  . Physical activity:    Days per week: Not on file    Minutes per session: Not on file  . Stress: Not on file  Relationships  . Social connections:    Talks on phone: Not on file    Gets together: Not on file    Attends religious service: Not on file    Active member of club or organization: Not on file    Attends meetings of clubs or organizations: Not on file    Relationship status: Not on file  . Intimate partner violence:    Fear of current or ex partner: Not on file    Emotionally abused: Not on file    Physically abused: Not on file     Forced sexual activity: Not on file  Other Topics Concern  . Not on file  Social History Narrative  . Not on file    Past Medical History, Surgical history, Social history, and Family history were reviewed and updated as appropriate.   Please see review of systems for further details on the patient's review from today.   Review of Systems:  Review of Systems  Constitutional: Negative for activity change, appetite change, chills, diaphoresis and fever.  HENT: Negative for trouble swallowing and voice change.   Respiratory: Negative for cough, chest tightness, shortness of breath and wheezing.  Cardiovascular: Negative for chest pain, palpitations and leg swelling.  Gastrointestinal: Negative for abdominal distention, abdominal pain, constipation, diarrhea, nausea and vomiting.  Genitourinary: Negative for difficulty urinating.  Musculoskeletal: Negative for back pain and myalgias.  Skin: Negative for color change, pallor and rash.  Neurological: Negative for dizziness, light-headedness and headaches.    Objective:   Physical Exam:  BP 137/81 (BP Location: Left Arm, Patient Position: Sitting)   Pulse 88   Temp 97.8 F (36.6 C) (Oral)   Resp 18   Ht 5\' 5"  (1.651 m)   Wt 188 lb 1.6 oz (85.3 kg)   SpO2 100%   BMI 31.30 kg/m  ECOG: 0  Physical Exam Constitutional:      General: He is not in acute distress.    Appearance: He is not diaphoretic.  HENT:     Head: Normocephalic and atraumatic.  Eyes:     General: No scleral icterus.       Right eye: No discharge.        Left eye: No discharge.     Conjunctiva/sclera: Conjunctivae normal.  Cardiovascular:     Rate and Rhythm: Normal rate and regular rhythm.     Heart sounds: Normal heart sounds. No murmur. No friction rub. No gallop.   Pulmonary:     Effort: Pulmonary effort is normal. No respiratory distress.     Breath sounds: Normal breath sounds. No wheezing or rales.  Abdominal:     General: Bowel sounds are  normal. There is no distension.     Palpations: Abdomen is soft. There is no mass.     Tenderness: There is no abdominal tenderness. There is no guarding or rebound.     Comments: The patient has a low midline surgical incision which is well-healed.  Musculoskeletal:     Right lower leg: No edema.     Left lower leg: No edema.  Skin:    General: Skin is warm and dry.     Findings: No erythema or rash.  Neurological:     Mental Status: He is alert.     Coordination: Coordination normal.     Gait: Gait normal.  Psychiatric:        Mood and Affect: Mood normal.        Behavior: Behavior normal.        Thought Content: Thought content normal.        Judgment: Judgment normal.     Lab Review:     Component Value Date/Time   NA 134 (L) 02/02/2019 0915   NA 137 07/16/2017 1502   NA 133 (L) 09/09/2016 1338   K 4.6 02/02/2019 0915   K 5.1 09/09/2016 1338   CL 102 02/02/2019 0915   CL 102 01/26/2013 0805   CO2 23 02/02/2019 0915   CO2 20 (L) 09/09/2016 1338   GLUCOSE 337 (H) 02/02/2019 0915   GLUCOSE 293 (H) 09/09/2016 1338   GLUCOSE 268 (H) 01/26/2013 0805   BUN 13 02/02/2019 0915   BUN 14 07/16/2017 1502   BUN 18.9 09/09/2016 1338   CREATININE 0.92 02/02/2019 0915   CREATININE 1.0 09/09/2016 1338   CALCIUM 9.3 02/02/2019 0915   CALCIUM 9.8 09/09/2016 1338   PROT 6.4 (L) 02/02/2019 0915   PROT 5.9 (L) 07/16/2017 1502   PROT 6.7 09/09/2016 1338   ALBUMIN 3.2 (L) 02/02/2019 0915   ALBUMIN 4.0 07/16/2017 1502   ALBUMIN 3.6 09/09/2016 1338   AST 23 02/02/2019 0915   AST 14 09/09/2016 1338  ALT 25 02/02/2019 0915   ALT 22 09/09/2016 1338   ALKPHOS 114 02/02/2019 0915   ALKPHOS 83 09/09/2016 1338   BILITOT 0.4 02/02/2019 0915   BILITOT 0.40 09/09/2016 1338   GFRNONAA >60 02/02/2019 0915   GFRNONAA >89 04/02/2016 1229   GFRAA >60 02/02/2019 0915   GFRAA >89 04/02/2016 1229       Component Value Date/Time   WBC 5.9 02/02/2019 0915   WBC 5.8 01/13/2019 1702   RBC  4.80 02/02/2019 0915   HGB 13.5 02/02/2019 0915   HGB 13.6 07/16/2017 1502   HGB 15.2 09/09/2016 1338   HCT 40.6 02/02/2019 0915   HCT 40.8 07/16/2017 1502   HCT 42.4 09/09/2016 1338   PLT 102 (L) 02/02/2019 0915   PLT 178 07/16/2017 1502   MCV 84.6 02/02/2019 0915   MCV 88 07/16/2017 1502   MCV 85.1 09/09/2016 1338   MCH 28.1 02/02/2019 0915   MCHC 33.3 02/02/2019 0915   RDW 15.8 (H) 02/02/2019 0915   RDW 14.1 07/16/2017 1502   RDW 12.6 09/09/2016 1338   LYMPHSABS 1.6 02/02/2019 0915   LYMPHSABS 0.8 (L) 09/09/2016 1338   MONOABS 0.8 02/02/2019 0915   MONOABS 0.1 09/09/2016 1338   EOSABS 0.3 02/02/2019 0915   EOSABS 0.0 09/09/2016 1338   BASOSABS 0.0 02/02/2019 0915   BASOSABS 0.0 09/09/2016 1338   -------------------------------  Imaging from last 24 hours (if applicable):  Radiology interpretation: Ct Head Wo Contrast  Result Date: 01/13/2019 CLINICAL DATA:  Severe headache for the past 3 days. Currently undergoing chemotherapy for colon cancer. EXAM: CT HEAD WITHOUT CONTRAST TECHNIQUE: Contiguous axial images were obtained from the base of the skull through the vertex without intravenous contrast. COMPARISON:  09/09/2016. FINDINGS: Brain: Stable minimal patchy white matter low density in both cerebral hemispheres. Normal size and position of the ventricles. No intracranial hemorrhage, mass lesion or CT evidence of acute infarction. Vascular: No hyperdense vessel or unexpected calcification. Skull: Normal. Negative for fracture or focal lesion. Sinuses/Orbits: Unremarkable. Other: None. IMPRESSION: 1. No acute abnormality. 2. Stable minimal chronic small vessel white matter ischemic changes in both cerebral hemispheres. Electronically Signed   By: Claudie Revering M.D.   On: 01/13/2019 17:58        This case was discussed with Dr. Burr Medico. She expressed agreement with my management of this patient.

## 2019-02-03 ENCOUNTER — Other Ambulatory Visit: Payer: Self-pay

## 2019-02-03 ENCOUNTER — Inpatient Hospital Stay: Payer: Medicaid Other

## 2019-02-03 VITALS — BP 147/83 | HR 94 | Temp 98.0°F | Resp 18

## 2019-02-03 DIAGNOSIS — C182 Malignant neoplasm of ascending colon: Secondary | ICD-10-CM

## 2019-02-03 DIAGNOSIS — Z5111 Encounter for antineoplastic chemotherapy: Secondary | ICD-10-CM | POA: Diagnosis not present

## 2019-02-03 MED ORDER — LEUCOVORIN CALCIUM INJECTION 350 MG
400.0000 mg/m2 | Freq: Once | INTRAVENOUS | Status: AC
  Administered 2019-02-03: 788 mg via INTRAVENOUS
  Filled 2019-02-03: qty 39.4

## 2019-02-03 MED ORDER — DEXTROSE 5 % IV SOLN
Freq: Once | INTRAVENOUS | Status: AC
  Administered 2019-02-03: 10:00:00 via INTRAVENOUS
  Filled 2019-02-03: qty 250

## 2019-02-03 MED ORDER — DEXAMETHASONE SODIUM PHOSPHATE 10 MG/ML IJ SOLN
5.0000 mg | Freq: Once | INTRAMUSCULAR | Status: AC
  Administered 2019-02-03: 5 mg via INTRAVENOUS

## 2019-02-03 MED ORDER — PALONOSETRON HCL INJECTION 0.25 MG/5ML
0.2500 mg | Freq: Once | INTRAVENOUS | Status: AC
  Administered 2019-02-03: 0.25 mg via INTRAVENOUS

## 2019-02-03 MED ORDER — SODIUM CHLORIDE 0.9 % IV SOLN
2400.0000 mg/m2 | INTRAVENOUS | Status: DC
  Administered 2019-02-03: 4750 mg via INTRAVENOUS
  Filled 2019-02-03: qty 95

## 2019-02-03 MED ORDER — PALONOSETRON HCL INJECTION 0.25 MG/5ML
INTRAVENOUS | Status: AC
  Filled 2019-02-03: qty 5

## 2019-02-03 MED ORDER — FLUOROURACIL CHEMO INJECTION 2.5 GM/50ML
400.0000 mg/m2 | Freq: Once | INTRAVENOUS | Status: AC
  Administered 2019-02-03: 800 mg via INTRAVENOUS
  Filled 2019-02-03: qty 16

## 2019-02-03 MED ORDER — OXALIPLATIN CHEMO INJECTION 100 MG/20ML
85.0000 mg/m2 | Freq: Once | INTRAVENOUS | Status: AC
  Administered 2019-02-03: 165 mg via INTRAVENOUS
  Filled 2019-02-03: qty 33

## 2019-02-03 MED ORDER — DEXAMETHASONE SODIUM PHOSPHATE 10 MG/ML IJ SOLN
INTRAMUSCULAR | Status: AC
  Filled 2019-02-03: qty 1

## 2019-02-03 NOTE — Patient Instructions (Signed)
[facility] Discharge Instructions for Patients Receiving Chemotherapy  Today you received the following chemotherapy agents :  Oxaliplatin,  Leucovorin,  Fluorouracil.  To help prevent nausea and vomiting after your treatment, we encourage you to take your nausea medication as prescribed.   If you develop nausea and vomiting that is not controlled by your nausea medication, call the clinic.   BELOW ARE SYMPTOMS THAT SHOULD BE REPORTED IMMEDIATELY:  *FEVER GREATER THAN 100.5 F  *CHILLS WITH OR WITHOUT FEVER  NAUSEA AND VOMITING THAT IS NOT CONTROLLED WITH YOUR NAUSEA MEDICATION  *UNUSUAL SHORTNESS OF BREATH  *UNUSUAL BRUISING OR BLEEDING  TENDERNESS IN MOUTH AND THROAT WITH OR WITHOUT PRESENCE OF ULCERS  *URINARY PROBLEMS  *BOWEL PROBLEMS  UNUSUAL RASH Items with * indicate a potential emergency and should be followed up as soon as possible.  Feel free to call the clinic should you have any questions or concerns. The clinic phone number is [phone].  Please show the CHEMO ALERT CARD at check-in to the Emergency Department and triage nurse.   

## 2019-02-05 ENCOUNTER — Inpatient Hospital Stay: Payer: Medicaid Other

## 2019-02-05 VITALS — BP 158/86 | HR 87 | Temp 98.1°F | Resp 18

## 2019-02-05 DIAGNOSIS — C182 Malignant neoplasm of ascending colon: Secondary | ICD-10-CM

## 2019-02-05 DIAGNOSIS — Z5111 Encounter for antineoplastic chemotherapy: Secondary | ICD-10-CM | POA: Diagnosis not present

## 2019-02-05 MED ORDER — HEPARIN SOD (PORK) LOCK FLUSH 100 UNIT/ML IV SOLN
500.0000 [IU] | Freq: Once | INTRAVENOUS | Status: AC | PRN
  Administered 2019-02-05: 500 [IU]
  Filled 2019-02-05: qty 5

## 2019-02-05 MED ORDER — SODIUM CHLORIDE 0.9% FLUSH
10.0000 mL | INTRAVENOUS | Status: DC | PRN
  Administered 2019-02-05: 10 mL
  Filled 2019-02-05: qty 10

## 2019-02-09 ENCOUNTER — Other Ambulatory Visit: Payer: Self-pay | Admitting: *Deleted

## 2019-02-09 ENCOUNTER — Telehealth: Payer: Self-pay | Admitting: *Deleted

## 2019-02-09 DIAGNOSIS — Z91041 Radiographic dye allergy status: Secondary | ICD-10-CM

## 2019-02-09 MED ORDER — METHYLPREDNISOLONE 32 MG PO TABS: ORAL_TABLET | ORAL | 0 refills | Status: DC

## 2019-02-09 MED FILL — METHYLPREDNISOLONE 32 MG TA: 32 | 1 days supply | Qty: 2 | Fill #0

## 2019-02-09 NOTE — Telephone Encounter (Signed)
Received call this am from [facility]/Radiology stating pt has CT ordered for tomorrow & has contrast allergy & needs 13 hour prep.  Reviewed chart & Sandi Mealy ordered medrol 10 hor & 2 hours prior to scan.  Spanish Interpreter-Julie contacted & she spoke with pt & gave him instructions to take medrol as ordered & also take benadryl 50 mg 1 hour prior to scan.  Moved script for medrol to [facility] from [place] notified [facility] per pt request.

## 2019-02-10 ENCOUNTER — Encounter (HOSPITAL_COMMUNITY): Payer: Self-pay

## 2019-02-10 ENCOUNTER — Ambulatory Visit (HOSPITAL_COMMUNITY)
Admission: RE | Admit: 2019-02-10 | Discharge: 2019-02-10 | Disposition: A | Discharge status: Home / Self Care | Payer: Medicaid Other | Source: Ambulatory Visit | Attending: Hematology | Admitting: Hematology

## 2019-02-10 DIAGNOSIS — C182 Malignant neoplasm of ascending colon: Secondary | ICD-10-CM | POA: Insufficient documentation

## 2019-02-10 IMAGING — CT CT ABD-PELV W/ CM
2 of 5 series · 15 of 46 positions shown, 17 images · IV contrast (OMNIPAQUE)
Comparison: [DATE].

CLINICAL DATA: Colon cancer, chemotherapy in progress. History of
non-Hodgkin lymphoma.

EXAM:
CT ABDOMEN AND PELVIS WITH CONTRAST
TECHNIQUE: Multidetector CT imaging of the abdomen and pelvis was performed
using the standard protocol following bolus administration of
intravenous contrast.
CONTRAST:  100mL OMNIPAQUE IOHEXOL 300 MG/ML  SOLN

[Series 2: axial st · axial · 0.81mm/px · z∈[-424,-19]mm · 12 of 97 slices shown, 14 images]
[im 8/97  soft-tissue]
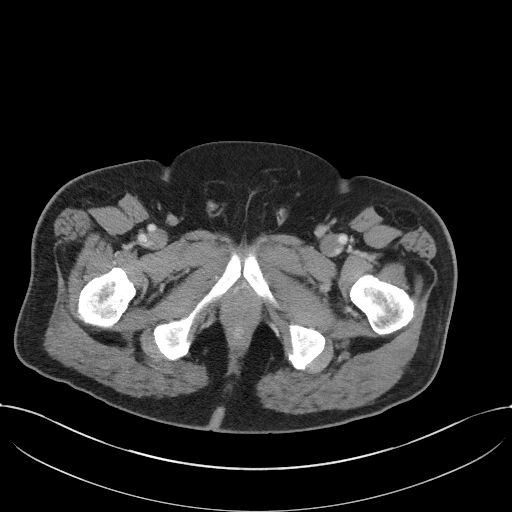
[im 8/97  bone]
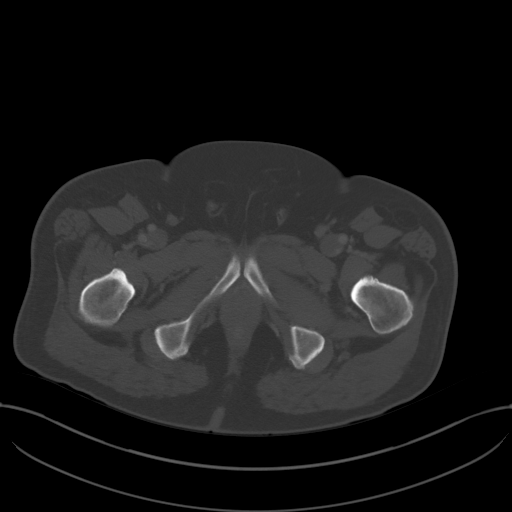
[im 15/97  soft-tissue]
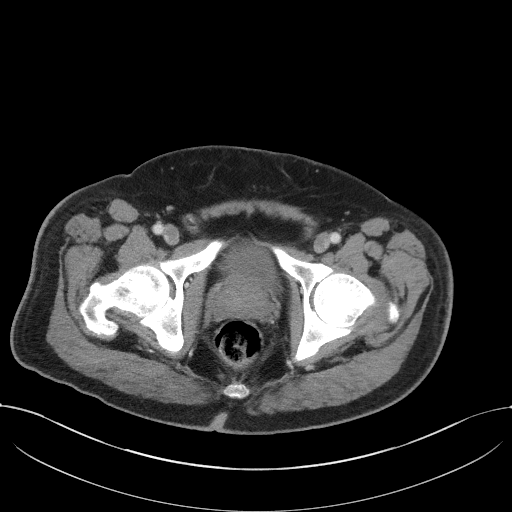
[im 23/97  soft-tissue]
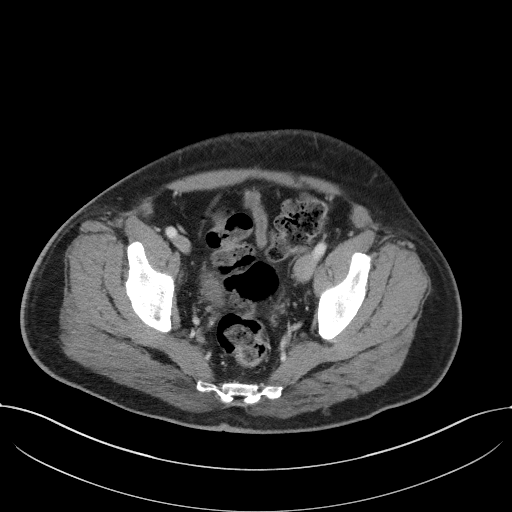
[im 30/97  soft-tissue]
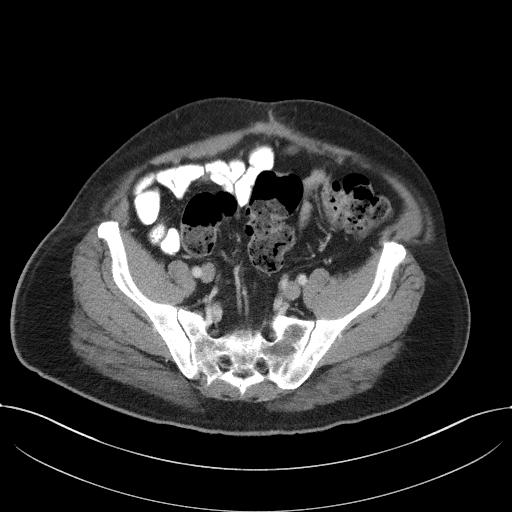
[im 37/97  soft-tissue]
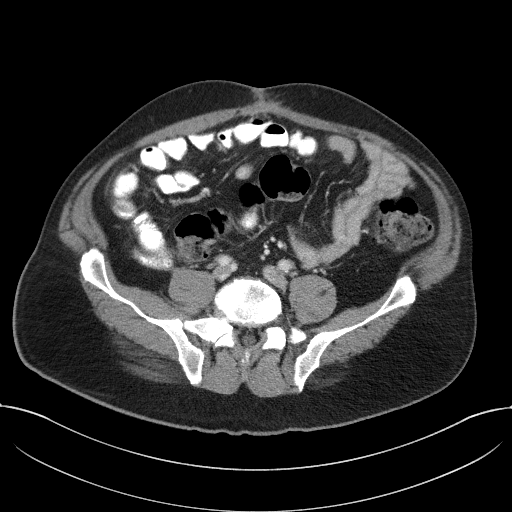
[im 45/97  soft-tissue]
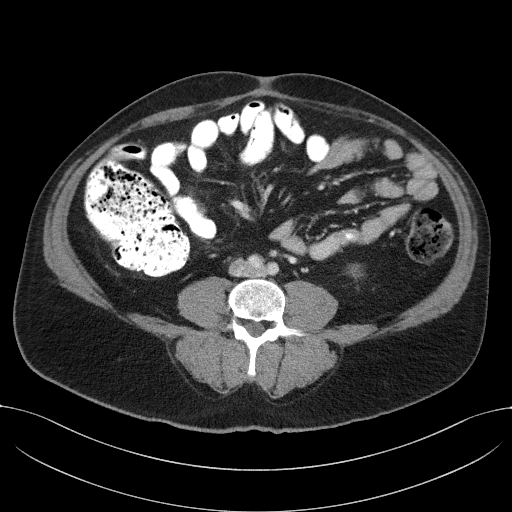
[im 52/97  soft-tissue]
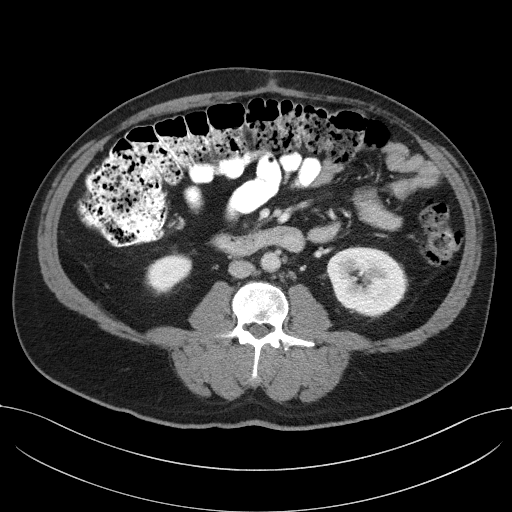
[im 60/97  soft-tissue]
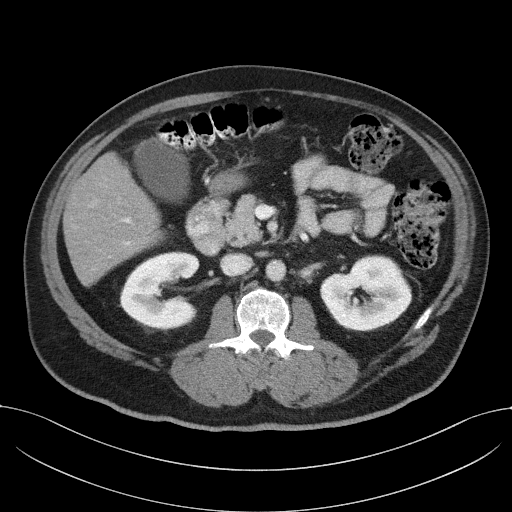
[im 67/97  soft-tissue]
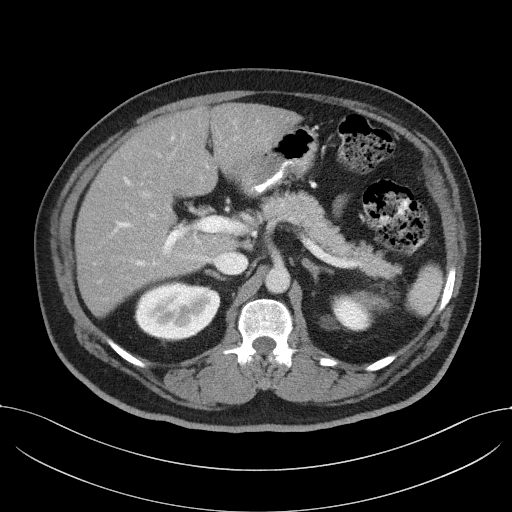
[im 67/97  bone]
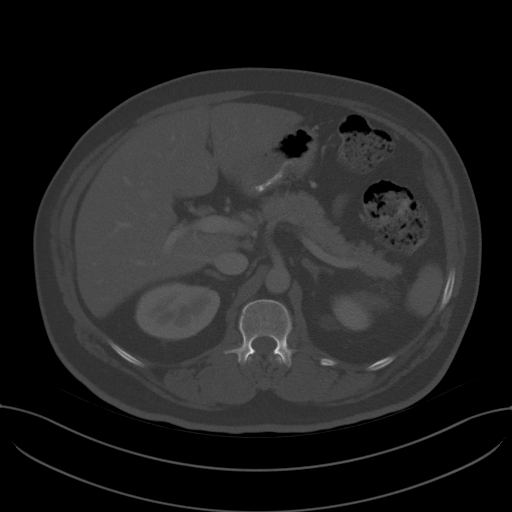
[im 74/97  soft-tissue]
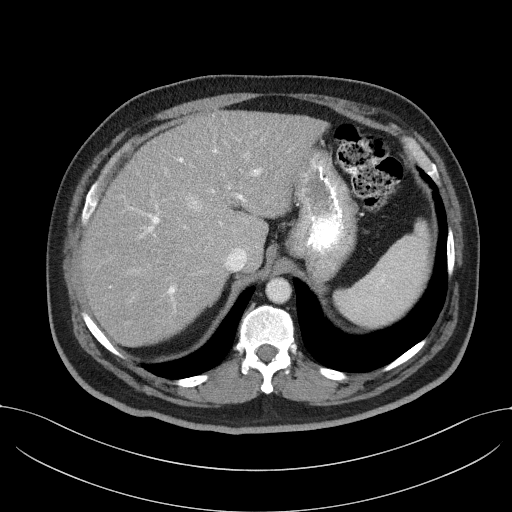
[im 82/97  soft-tissue]
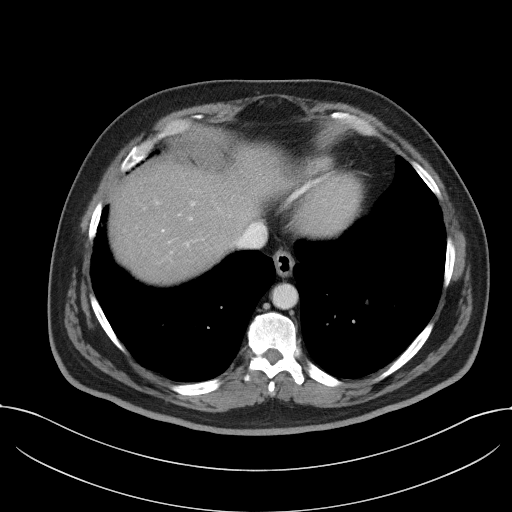
[im 89/97  soft-tissue]
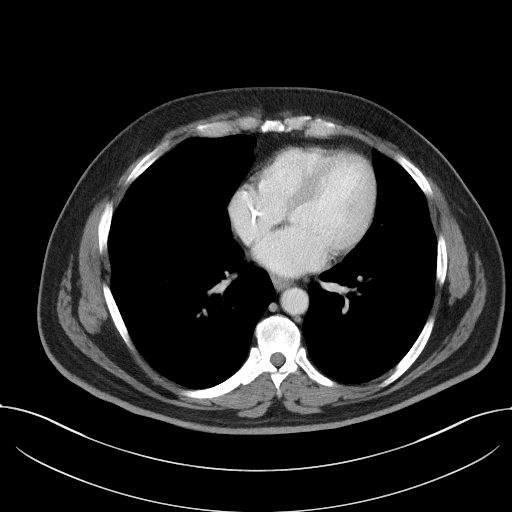

[Series 4: coronal st · coronal · 0.78mm/px · 3 of 106 slices shown]
[im 36/106  soft-tissue]
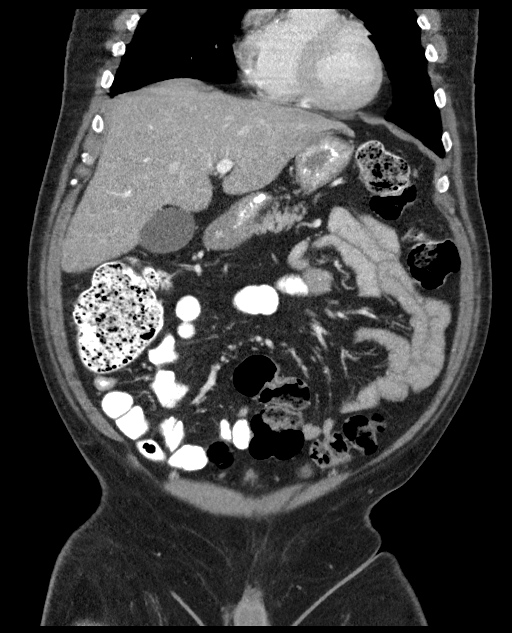
[im 47/106  soft-tissue]
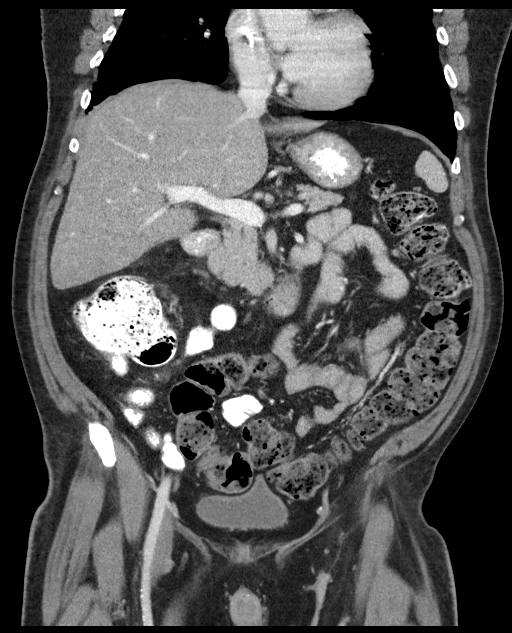
[im 59/106  soft-tissue]
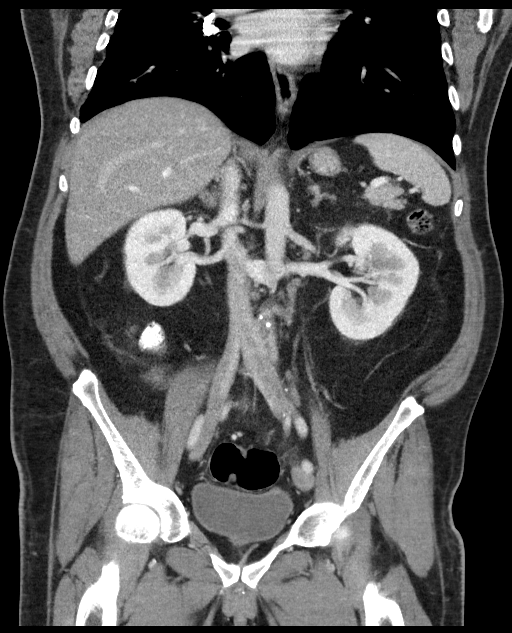

[15 of 46 positions shown; findings below may reference images not displayed]

FINDINGS: Lower chest: Calcified granulomas in the lung bases. Lungs are
otherwise clear. Lung bases are otherwise clear. Heart size normal.
No pericardial or pleural effusion. Coronary artery calcification.
Distal esophagus is grossly unremarkable.

Hepatobiliary: Liver is slightly decreased in attenuation diffusely.
Liver and gallbladder are otherwise unremarkable. No biliary ductal
dilatation.

Pancreas: Negative.

Spleen: Negative.

Adrenals/Urinary Tract: Adrenal glands and right kidney are
unremarkable. Exophytic low-attenuation lesion off the upper pole
left kidney is indicative of a cyst. Ureters are decompressed.
Bladder is unremarkable.

Stomach/Bowel: Stomach and small bowel are unremarkable.
Appendectomy. Stool is seen throughout the colon, indicative of
constipation.

Vascular/Lymphatic: Atherosclerotic calcification of the aorta
without aneurysm. Retroaortic left renal vein. No pathologically
enlarged lymph nodes.

Reproductive: Prostate is slightly enlarged.

Other: No free fluid.  Mesenteries and peritoneum are unremarkable.

Musculoskeletal: No worrisome lytic or sclerotic lesions.
IMPRESSION: 1. No evidence metastatic disease.
2. Hepatic steatosis.
3. Mildly enlarged prostate.
4. Aortic atherosclerosis ([CX]-170.0). coronary artery
calcification.

## 2019-02-10 MED ORDER — IOHEXOL 300 MG/ML  SOLN
100.0000 mL | Freq: Once | INTRAMUSCULAR | Status: AC | PRN
  Administered 2019-02-10: 100 mL via INTRAVENOUS

## 2019-02-10 MED ORDER — SODIUM CHLORIDE (PF) 0.9 % IJ SOLN
INTRAMUSCULAR | Status: AC
  Filled 2019-02-10: qty 50

## 2019-02-15 NOTE — Progress Notes (Signed)
[place]   [phone] Fax:([phone]) [phone]   Clinic Follow up Note   Patient Care Team: [name], [name], DO as PCP - General [name], MD as Consulting Physician (Colon and Rectal Surgery) [name], MD as Consulting Physician (Hematology) 02/17/2019  CHIEF COMPLAINT: F/u on colon cancer   SUMMARY OF ONCOLOGIC HISTORY: Oncology History   Cancer Staging Cancer of right colon [facility]) Staging form: Colon and Rectum, AJCC 8th Edition - Pathologic stage from 10/21/2018: Stage IVC (pT4b, pN1a, pM1c) - Signed by [name], MD on 24/01/6833  Follicular lymphoma ([place]), History of Staging form: Lymphoid Neoplasms, AJCC 6th Edition - Clinical: Stage II - Signed by Curt Bears, MD on 1/96/2229       Follicular lymphoma [facility]), History of   11/2009 Initial Diagnosis    Follicular lymphoma ([place]), History of     Chemotherapy    1) Status post 6 cycles of systemic chemotherapy with CHOP/Rituxan last dose given 05/01/2009.  2) Maintenance Rituxan at 375 mg per meter square given every 2 months status post 12 cycles      Cancer of right colon ([place])   10/21/2018 Surgery    Exploratory laparotomy right hemicolectomy by Dr. [name] and Dr. [name]  10/21/18    10/21/2018 Pathology Results    Diagnosis 10/21/18 Colon, segmental resection for tumor, right ascending and appendix - ADENOCARCINOMA, MODERATE TO POORLY DIFFERENTIATED (4 CM) - METASTATIC CARCINOMA INVOLVING ONE OF EIGHTEEN LYMPH NODES (1/18) - CARCINOMA EXTENDS INTO THE APPENDIX - TWO TUMOR DEPOSITS PRESENT - SEE ONCOLOGY TABLE AND COMMENT BELOW    10/21/2018 Cancer Staging    Staging form: Colon and Rectum, AJCC 8th Edition - Pathologic stage from 10/21/2018: Stage IVC (pT4b, pN1a, pM1c) - Signed by [name], MD on 11/13/2018    11/04/2018 Imaging    CT AP W Contrast 11/04/18  IMPRESSION: 1. Interval appendectomy. There is residual soft tissue thickening and stranding in the  right lower quadrant adjacent to the cecum which may represent ongoing inflammation versus postsurgical changes. A small soft tissue density adjacent to the surgical sutures may reflect small hematoma or operative collection. No large focal fluid collection to suggest drainable abscess allowing for absence of contrast 2. Fluid-filled colon without wall thickening, could reflect diarrheal process    11/08/2018 Imaging    CT Chest W Contrast 11/08/18  IMPRESSION: 1. No acute findings are noted in the thorax to account for the patient's symptoms. 2. Aortic atherosclerosis, in addition to left main and 3 vessel coronary artery disease. Please note that although the presence of coronary artery calcium documents the presence of coronary artery disease, the severity of this disease and any potential stenosis cannot be assessed on this non-gated CT examination. Assessment for potential risk factor modification, dietary therapy or pharmacologic therapy may be warranted, if clinically indicated. 3. Additional incidental findings, as above. Aortic Atherosclerosis (ICD10-I70.0).    11/12/2018 Initial Diagnosis    Cancer of right colon ([place])    11/25/2018 -  Chemotherapy    FOLFOX q2weeks starting 11/25/18    02/10/2019 Imaging    CT AP W Contrast  IMPRESSION: 1. No evidence metastatic disease. 2. Hepatic steatosis. 3. Mildly enlarged prostate. 4. Aortic atherosclerosis (ICD10-170.0). coronary artery calcification.     CURRENT THERAPY  Adjuvant FOLFOX every 2 weeks starting12/19/19  INTERVAL HISTORY: Blake Vaughn is a 58 y.o. male who is here for follow-up, he is here with the interpreter.  He had a restaging CT AP on 02/10/2019  and it showed no evidence of metastatic disease.  He has been doing well with chemo overall, moderate fatigue, some shoulder pain for 3 to 4 days after chemo, which resolves spontaneously.  His appetite and energy level remains to be decent, able to function  well at home.  No fever, chills, or bleeding.  Pertinent positives and negatives of review of systems are listed and detailed within the above HPI.  REVIEW OF SYSTEMS:   Constitutional: Denies fevers, chills or abnormal weight loss, (+) fatigue  Eyes: Denies blurriness of vision Ears, nose, mouth, throat, and face: Denies mucositis or sore throat Respiratory: Denies cough, dyspnea or wheezes Cardiovascular: Denies palpitation, chest discomfort or lower extremity swelling Gastrointestinal:  Denies nausea, heartburn or change in bowel habits Skin: Denies abnormal skin rashes Lymphatics: Denies new lymphadenopathy or easy bruising Neurological:Denies numbness, tingling or new weaknesses Behavioral/Psych: Mood is stable, no new changes  All other systems were reviewed with the patient and are negative.  MEDICAL HISTORY:  Past Medical History:  Diagnosis Date  . Appendicitis 10/21/2018  . Diabetes mellitus   . Follicular lymphoma ([place]), 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   Qualifier: Diagnosis of  By: [name], [name]   . Hypercholesterolemia   . Hypertension 11/27/2011  . Lymphoma [facility])    gets annual chemo last tx Feb 2013  . TOBACCO USE, QUIT 08/18/2009   Qualifier: Diagnosis of  By: Drue Flirt  MD, Merrily Brittle      SURGICAL HISTORY: Past Surgical History:  Procedure Laterality Date  . ESOPHAGOGASTRODUODENOSCOPY (EGD) WITH PROPOFOL N/A 11/05/2018   Procedure: ESOPHAGOGASTRODUODENOSCOPY (EGD) WITH PROPOFOL;  Surgeon: [name], MD;  Location: [place];  Service: Endoscopy;  Laterality: N/A;  . IR IMAGING GUIDED PORT INSERTION  11/24/2018  . LAPAROSCOPIC APPENDECTOMY N/A 10/21/2018   Procedure: Exploratory laparotomy right hemicolectomy;  Surgeon: [name], MD;  Location: [place];  Service: General;  Laterality: N/A;    I have reviewed the social history and family history with the patient and they are unchanged from previous note.  ALLERGIES:  is allergic to  iohexol.  MEDICATIONS:  Current Outpatient Medications  Medication Sig Dispense Refill  . Alcohol Swabs PADS Use as instructed 100 each 6  . atorvastatin (LIPITOR) 10 MG tablet Take 1 tablet (10 mg total) by mouth daily. 90 tablet 4  . Blood Glucose Monitoring Suppl (RELION PRIME MONITOR) DEVI Use device as instructed 1 Device 0  . docusate sodium (COLACE) 100 MG capsule Take 1 capsule (100 mg total) by mouth 2 (two) times daily. 10 capsule 0  . feeding supplement (ENSURE SURGERY) LIQD Take 237 mLs by mouth 2 (two) times daily between meals.    Marland Kitchen glucose blood test strip Use as instructed 100 each 12  . lidocaine-prilocaine (EMLA) cream Apply to affected area once 30 g 3  . metFORMIN (GLUCOPHAGE) 1000 MG tablet TAKE 1 TABLET BY MOUTH TWICE DAILY WITH A MEAL 90 tablet 0  . methylPREDNISolone (MEDROL) 32 MG tablet 1 tablet 12 hours and 2 hours prior to receiving IV contrast dye. Monitor blood glucose closely. 2 tablet 0  . metoprolol tartrate (LOPRESSOR) 25 MG tablet Take 0.5 tablets (12.5 mg total) by mouth 2 (two) times daily. 60 tablet 0  . ondansetron (ZOFRAN) 8 MG tablet Take 1 tablet (8 mg total) by mouth 2 (two) times daily as needed for refractory nausea / vomiting. Start on day 3 after chemotherapy. 30 tablet 1  . polyethylene glycol (MIRALAX / GLYCOLAX) packet Take 17 g by  mouth daily. 14 each 0  . prochlorperazine (COMPAZINE) 10 MG tablet Take 1 tablet (10 mg total) by mouth every 6 (six) hours as needed (Nausea or vomiting). 30 tablet 2  . RELION LANCETS STANDARD 21G MISC Use as instructed 200 each 6  . saccharomyces boulardii (FLORASTOR) 250 MG capsule Take 1 capsule (250 mg total) by mouth 2 (two) times daily.     No current facility-administered medications for this visit.     PHYSICAL EXAMINATION: ECOG PERFORMANCE STATUS: 1 - Symptomatic but completely ambulatory  Vitals:   02/17/19 1006  BP: (!) 152/81  Pulse: 78  Resp: 18  Temp: 97.6 F (36.4 C)  SpO2: 100%   Filed  Weights   02/17/19 1006  Weight: 187 lb 1.6 oz (84.9 kg)    GENERAL:alert, no distress and comfortable SKIN: skin color, texture, turgor are normal, no rashes or significant lesions EYES: normal, Conjunctiva are pink and non-injected, sclera clear OROPHARYNX:no exudate, no erythema and lips, buccal mucosa, and tongue normal  NECK: supple, thyroid normal size, non-tender, without nodularity LYMPH:  no palpable lymphadenopathy in the cervical, axillary or inguinal LUNGS: clear to auscultation and percussion with normal breathing effort HEART: regular rate & rhythm and no murmurs and no lower extremity edema ABDOMEN:abdomen soft, non-tender and normal bowel sounds Musculoskeletal:no cyanosis of digits and no clubbing  NEURO: alert & oriented x 3 with fluent speech, no focal motor/sensory deficits  LABORATORY DATA:  I have reviewed the data as listed CBC Latest Ref Rng & Units 02/17/2019 02/02/2019 01/20/2019  WBC 4.0 - 10.5 K/uL 6.4 5.9 5.7  Hemoglobin 13.0 - 17.0 g/dL 13.6 13.5 12.7(L)  Hematocrit 39.0 - 52.0 % 41.7 40.6 38.9(L)  Platelets 150 - 400 K/uL 84(L) 102(L) 124(L)     CMP Latest Ref Rng & Units 02/17/2019 02/02/2019 01/20/2019  Glucose 70 - 99 mg/dL 207(H) 337(H) 245(H)  BUN 6 - 20 mg/dL '13 13 14  ' Creatinine 0.61 - 1.24 mg/dL 0.78 0.92 0.95  Sodium 135 - 145 mmol/L 135 134(L) 138  Potassium 3.5 - 5.1 mmol/L 4.0 4.6 4.3  Chloride 98 - 111 mmol/L 103 102 107  CO2 22 - 32 mmol/L '23 23 24  ' Calcium 8.9 - 10.3 mg/dL 9.2 9.3 9.1  Total Protein 6.5 - 8.1 g/dL 6.4(L) 6.4(L) 6.2(L)  Total Bilirubin 0.3 - 1.2 mg/dL 0.5 0.4 0.3  Alkaline Phos 38 - 126 U/L 108 114 88  AST 15 - 41 U/L '25 23 22  ' ALT 0 - 44 U/L '24 25 21      ' RADIOGRAPHIC STUDIES: I have personally reviewed the radiological images as listed and agreed with the findings in the report.  02/10/2019 CT AP W Contrast  IMPRESSION: 1. No evidence metastatic disease. 2. Hepatic steatosis. 3. Mildly enlarged  prostate. 4. Aortic atherosclerosis (ICD10-170.0). coronary artery calcification.  ASSESSMENT & PLAN:  Blake Vaughn is a 58 y.o. male with history of  1.RightCecal adenocarcinoma,pT4bN1aM1c,Stage IVC,with limited peritoneal metastasis,resected,Grade II-III, MMR normal -Diagnosed in 10/2018. Treated with right hemicolectomy.He was found to have 2 smallperitoneal metastasis during the surgery, which were removed.  -He has started djuvantFOLFOX q2weeks. Tolerating well. - I will repeat CT abdomen and pelvis w contrastevery 3-6 months for the first year for close monitoring. If no evidence of disease after 6 months of adjuvant chemo, plan to stop chemo then. - He is allergic to  IV contrast  - He had a restaging CT AP on 02/10/2019 and it showed no evidence of metastatic disease.  I personally reviewed his scan and discussed with patient. -He is tolerating chemotherapy well, mild fatigue, shoulder pain for a few days after chemo which resolved spontaneously. -Labs reviewed,he has dropped moderate thrombocytopenia, secondary to chemo, I will reduce 5-FU dose and remove the bolus, continue chemo treatment.  2. H/o Follicular Lymphoma -Was treated with CHOP/Rituxan in 2010. Last f/u was in 2017. -Maintenance Rituxan at 375 mg/m2given every 2 months  3. DM and Hypercholesterolemia -On metformin 2500 mg daily, Lipitor and metoprolol -His blood glucose has been increased lately due to the premedication steroids, improved now  -I previously decreased his pre-chemo dexamethasone from 10 mg to 5 mg from next cycle - I previously encouraged him to monitor his blood glucose at home  -F/u with PCP.  4. Financial Support -Medicaid pending -His wife is in [place]. Has 2 children and a brother that help his at home  5. Insomnia -I previously recommend Melatonin or Benadryl.  6. Worsening thrombocytopenia -secondary to chemotherapy -will reduce chemo dose  - I will monitor     Plan -Labs reviewed, adequate for treatment, will proceed with FOLFOX today and every 2 weeks, due to moderate thrombocytopenia, I will stop 5-FU bolus, and reduce pump infusion to 2281m/m2  -f/u in 4 weeks with labs    No problem-specific Assessment & Plan notes found for this encounter.   No orders of the defined types were placed in this encounter.  All questions were answered. The patient knows to call the clinic with any problems, questions or concerns. No barriers to learning was detected. I spent 25 minutes counseling the patient face to face. The total time spent in the appointment was 30 minutes and more than 50% was on counseling and review of test results  I, DManson Allanam acting as scribe for Dr. YTruitt [name]  I have reviewed the above documentation for accuracy and completeness, and I agree with the above.     YTruitt [name] MD 02/17/2019

## 2019-02-17 ENCOUNTER — Encounter: Payer: Self-pay | Admitting: Hematology

## 2019-02-17 ENCOUNTER — Inpatient Hospital Stay: Payer: Medicaid Other | Attending: Hematology | Admitting: Hematology

## 2019-02-17 ENCOUNTER — Inpatient Hospital Stay: Payer: Medicaid Other

## 2019-02-17 ENCOUNTER — Other Ambulatory Visit: Payer: Self-pay

## 2019-02-17 ENCOUNTER — Telehealth: Payer: Self-pay | Admitting: Hematology

## 2019-02-17 VITALS — BP 152/81 | HR 78 | Temp 97.6°F | Resp 18 | Ht 65.0 in | Wt 187.1 lb

## 2019-02-17 DIAGNOSIS — Z95828 Presence of other vascular implants and grafts: Secondary | ICD-10-CM

## 2019-02-17 DIAGNOSIS — C786 Secondary malignant neoplasm of retroperitoneum and peritoneum: Secondary | ICD-10-CM | POA: Insufficient documentation

## 2019-02-17 DIAGNOSIS — C182 Malignant neoplasm of ascending colon: Secondary | ICD-10-CM

## 2019-02-17 DIAGNOSIS — Z5111 Encounter for antineoplastic chemotherapy: Secondary | ICD-10-CM | POA: Diagnosis present

## 2019-02-17 DIAGNOSIS — Z7984 Long term (current) use of oral hypoglycemic drugs: Secondary | ICD-10-CM

## 2019-02-17 DIAGNOSIS — E119 Type 2 diabetes mellitus without complications: Secondary | ICD-10-CM | POA: Insufficient documentation

## 2019-02-17 DIAGNOSIS — Z8572 Personal history of non-Hodgkin lymphomas: Secondary | ICD-10-CM | POA: Diagnosis not present

## 2019-02-17 DIAGNOSIS — D500 Iron deficiency anemia secondary to blood loss (chronic): Secondary | ICD-10-CM

## 2019-02-17 DIAGNOSIS — N400 Enlarged prostate without lower urinary tract symptoms: Secondary | ICD-10-CM | POA: Insufficient documentation

## 2019-02-17 DIAGNOSIS — Z79899 Other long term (current) drug therapy: Secondary | ICD-10-CM

## 2019-02-17 DIAGNOSIS — C8200 Follicular lymphoma grade I, unspecified site: Secondary | ICD-10-CM

## 2019-02-17 LAB — CMP (CANCER CENTER ONLY)
ALK PHOS: 108 U/L (ref 38–126)
ALT: 24 U/L (ref 0–44)
AST: 25 U/L (ref 15–41)
Albumin: 3.2 g/dL — ABNORMAL LOW (ref 3.5–5.0)
Anion gap: 9 (ref 5–15)
BUN: 13 mg/dL (ref 6–20)
CO2: 23 mmol/L (ref 22–32)
CREATININE: 0.78 mg/dL (ref 0.61–1.24)
Calcium: 9.2 mg/dL (ref 8.9–10.3)
Chloride: 103 mmol/L (ref 98–111)
GFR, Est AFR Am: >60 mL/min (ref >60)
GFR, Estimated: >60 mL/min (ref >60)
Glucose, Bld: 207 mg/dL — ABNORMAL HIGH (ref 70–99)
Potassium: 4 mmol/L (ref 3.5–5.1)
Sodium: 135 mmol/L (ref 135–145)
Total Bilirubin: 0.5 mg/dL (ref 0.3–1.2)
Total Protein: 6.4 g/dL — ABNORMAL LOW (ref 6.5–8.1)

## 2019-02-17 LAB — CBC WITH DIFFERENTIAL (CANCER CENTER ONLY)
Abs Immature Granulocytes: 0.01 10*3/uL (ref 0.00–0.07)
Basophils Absolute: 0 10*3/uL (ref 0.0–0.1)
Basophils Relative: 0 %
Eosinophils Absolute: 0.2 10*3/uL (ref 0.0–0.5)
Eosinophils Relative: 3 %
HEMATOCRIT: 41.7 % (ref 39.0–52.0)
Hemoglobin: 13.6 g/dL (ref 13.0–17.0)
Immature Granulocytes: 0 %
LYMPHS ABS: 2 10*3/uL (ref 0.7–4.0)
LYMPHS PCT: 32 %
MCH: 27.6 pg (ref 26.0–34.0)
MCHC: 32.6 g/dL (ref 30.0–36.0)
MCV: 84.6 fL (ref 80.0–100.0)
Monocytes Absolute: 1 10*3/uL (ref 0.1–1.0)
Monocytes Relative: 16 %
Neutro Abs: 3.2 10*3/uL (ref 1.7–7.7)
Neutrophils Relative %: 49 %
Platelet Count: 84 10*3/uL — ABNORMAL LOW (ref 150–400)
RBC: 4.93 MIL/uL (ref 4.22–5.81)
RDW: 16.3 % — ABNORMAL HIGH (ref 11.5–15.5)
WBC Count: 6.4 10*3/uL (ref 4.0–10.5)
nRBC: 0 % (ref 0.0–0.2)

## 2019-02-17 LAB — IRON AND TIBC
Iron: 47 ug/dL (ref 42–163)
Saturation Ratios: 9 % — ABNORMAL LOW (ref 20–55)
TIBC: 500 ug/dL — ABNORMAL HIGH (ref 202–409)
UIBC: 453 ug/dL — ABNORMAL HIGH (ref 117–376)

## 2019-02-17 LAB — FERRITIN: FERRITIN: 24 ng/mL (ref 24–336)

## 2019-02-17 LAB — CEA (IN HOUSE-CHCC): CEA (CHCC-In House): 7.03 ng/mL — ABNORMAL HIGH (ref 0.00–5.00)

## 2019-02-17 MED ORDER — PALONOSETRON HCL INJECTION 0.25 MG/5ML
INTRAVENOUS | Status: AC
  Filled 2019-02-17: qty 5

## 2019-02-17 MED ORDER — DEXAMETHASONE SODIUM PHOSPHATE 10 MG/ML IJ SOLN
INTRAMUSCULAR | Status: AC
  Filled 2019-02-17: qty 1

## 2019-02-17 MED ORDER — DEXTROSE 5 % IV SOLN
Freq: Once | INTRAVENOUS | Status: AC
  Administered 2019-02-17: 12:00:00 via INTRAVENOUS
  Filled 2019-02-17: qty 250

## 2019-02-17 MED ORDER — OXALIPLATIN CHEMO INJECTION 100 MG/20ML
85.0000 mg/m2 | Freq: Once | INTRAVENOUS | Status: AC
  Administered 2019-02-17: 165 mg via INTRAVENOUS
  Filled 2019-02-17: qty 20

## 2019-02-17 MED ORDER — PALONOSETRON HCL INJECTION 0.25 MG/5ML
0.2500 mg | Freq: Once | INTRAVENOUS | Status: AC
  Administered 2019-02-17: 0.25 mg via INTRAVENOUS

## 2019-02-17 MED ORDER — SODIUM CHLORIDE 0.9 % IV SOLN
2200.0000 mg/m2 | INTRAVENOUS | Status: DC
  Administered 2019-02-17: 4350 mg via INTRAVENOUS
  Filled 2019-02-17: qty 87

## 2019-02-17 MED ORDER — SODIUM CHLORIDE 0.9% FLUSH
10.0000 mL | INTRAVENOUS | Status: DC | PRN
  Administered 2019-02-17: 10 mL
  Filled 2019-02-17: qty 10

## 2019-02-17 MED ORDER — DEXAMETHASONE SODIUM PHOSPHATE 10 MG/ML IJ SOLN
5.0000 mg | Freq: Once | INTRAMUSCULAR | Status: AC
  Administered 2019-02-17: 5 mg via INTRAVENOUS

## 2019-02-17 MED ORDER — LEUCOVORIN CALCIUM INJECTION 350 MG
400.0000 mg/m2 | Freq: Once | INTRAVENOUS | Status: AC
  Administered 2019-02-17: 788 mg via INTRAVENOUS
  Filled 2019-02-17: qty 39.4

## 2019-02-17 NOTE — Progress Notes (Signed)
Okay to treat today with dose reduction per Dr. Burr Medico.

## 2019-02-17 NOTE — Patient Instructions (Signed)
[facility] Discharge Instructions for Patients Receiving Chemotherapy  Today you received the following chemotherapy agents: Oxaliplatin, leucovorin, 5FU   To help prevent nausea and vomiting after your treatment, we encourage you to take your nausea medication as directed.    If you develop nausea and vomiting that is not controlled by your nausea medication, call the clinic.   BELOW ARE SYMPTOMS THAT SHOULD BE REPORTED IMMEDIATELY:  *FEVER GREATER THAN 100.5 F  *CHILLS WITH OR WITHOUT FEVER  NAUSEA AND VOMITING THAT IS NOT CONTROLLED WITH YOUR NAUSEA MEDICATION  *UNUSUAL SHORTNESS OF BREATH  *UNUSUAL BRUISING OR BLEEDING  TENDERNESS IN MOUTH AND THROAT WITH OR WITHOUT PRESENCE OF ULCERS  *URINARY PROBLEMS  *BOWEL PROBLEMS  UNUSUAL RASH Items with * indicate a potential emergency and should be followed up as soon as possible.  Feel free to call the clinic should you have any questions or concerns. The clinic phone number is [phone].  Please show the CHEMO ALERT CARD at check-in to the Emergency Department and triage nurse.   

## 2019-02-17 NOTE — Telephone Encounter (Signed)
Scheduled appt per 3/12 los.  Printed calendar and took it to the patient in the treatment area.

## 2019-02-19 ENCOUNTER — Inpatient Hospital Stay: Payer: Medicaid Other

## 2019-02-19 VITALS — BP 139/81 | HR 92 | Temp 98.2°F | Resp 18

## 2019-02-19 DIAGNOSIS — Z5111 Encounter for antineoplastic chemotherapy: Secondary | ICD-10-CM | POA: Diagnosis not present

## 2019-02-19 MED ORDER — HEPARIN SOD (PORK) LOCK FLUSH 100 UNIT/ML IV SOLN
500.0000 [IU] | Freq: Once | INTRAVENOUS | Status: AC | PRN
  Administered 2019-02-19: 500 [IU]
  Filled 2019-02-19: qty 5

## 2019-02-19 MED ORDER — SODIUM CHLORIDE 0.9% FLUSH
10.0000 mL | INTRAVENOUS | Status: DC | PRN
  Administered 2019-02-19: 10 mL
  Filled 2019-02-19: qty 10

## 2019-03-01 MED FILL — PROCHLORPERAZINE 10 MG TAB: 10 | 7 days supply | Qty: 30 | Fill #1

## 2019-03-01 MED FILL — ONDANSETRON HCL 8 MG TABLET: 8 | 15 days supply | Qty: 30 | Fill #0

## 2019-03-03 ENCOUNTER — Other Ambulatory Visit: Payer: Self-pay

## 2019-03-03 ENCOUNTER — Inpatient Hospital Stay: Payer: Medicaid Other

## 2019-03-03 ENCOUNTER — Other Ambulatory Visit: Payer: Self-pay | Admitting: Hematology

## 2019-03-03 VITALS — BP 139/76 | HR 80 | Temp 98.0°F | Resp 18 | Ht 60.0 in | Wt 188.5 lb

## 2019-03-03 DIAGNOSIS — C182 Malignant neoplasm of ascending colon: Secondary | ICD-10-CM

## 2019-03-03 DIAGNOSIS — Z95828 Presence of other vascular implants and grafts: Secondary | ICD-10-CM

## 2019-03-03 DIAGNOSIS — Z5111 Encounter for antineoplastic chemotherapy: Secondary | ICD-10-CM | POA: Diagnosis not present

## 2019-03-03 LAB — CBC WITH DIFFERENTIAL (CANCER CENTER ONLY)
Abs Immature Granulocytes: 0.01 10*3/uL (ref 0.00–0.07)
Basophils Absolute: 0 10*3/uL (ref 0.0–0.1)
Basophils Relative: 0 %
Eosinophils Absolute: 0.2 10*3/uL (ref 0.0–0.5)
Eosinophils Relative: 4 %
HCT: 41.4 % (ref 39.0–52.0)
Hemoglobin: 13.2 g/dL (ref 13.0–17.0)
Immature Granulocytes: 0 %
Lymphocytes Relative: 31 %
Lymphs Abs: 1.7 10*3/uL (ref 0.7–4.0)
MCH: 27.6 pg (ref 26.0–34.0)
MCHC: 31.9 g/dL (ref 30.0–36.0)
MCV: 86.4 fL (ref 80.0–100.0)
Monocytes Absolute: 1 10*3/uL (ref 0.1–1.0)
Monocytes Relative: 17 %
NEUTROS PCT: 48 %
Neutro Abs: 2.6 10*3/uL (ref 1.7–7.7)
Platelet Count: 94 10*3/uL — ABNORMAL LOW (ref 150–400)
RBC: 4.79 MIL/uL (ref 4.22–5.81)
RDW: 16.5 % — ABNORMAL HIGH (ref 11.5–15.5)
WBC Count: 5.6 10*3/uL (ref 4.0–10.5)
nRBC: 0 % (ref 0.0–0.2)

## 2019-03-03 LAB — CMP (CANCER CENTER ONLY)
ALT: 38 U/L (ref 0–44)
AST: 39 U/L (ref 15–41)
Albumin: 3 g/dL — ABNORMAL LOW (ref 3.5–5.0)
Alkaline Phosphatase: 157 U/L — ABNORMAL HIGH (ref 38–126)
Anion gap: 8 (ref 5–15)
BUN: 9 mg/dL (ref 6–20)
CO2: 22 mmol/L (ref 22–32)
Calcium: 8.6 mg/dL — ABNORMAL LOW (ref 8.9–10.3)
Chloride: 106 mmol/L (ref 98–111)
Creatinine: 0.83 mg/dL (ref 0.61–1.24)
GFR, Est AFR Am: >60 mL/min (ref >60)
GFR, Estimated: >60 mL/min (ref >60)
Glucose, Bld: 280 mg/dL — ABNORMAL HIGH (ref 70–99)
Potassium: 4.1 mmol/L (ref 3.5–5.1)
Sodium: 136 mmol/L (ref 135–145)
Total Bilirubin: 0.5 mg/dL (ref 0.3–1.2)
Total Protein: 6.2 g/dL — ABNORMAL LOW (ref 6.5–8.1)

## 2019-03-03 MED ORDER — DEXTROSE 5 % IV SOLN
Freq: Once | INTRAVENOUS | Status: AC
  Administered 2019-03-03: 10:00:00 via INTRAVENOUS
  Filled 2019-03-03: qty 250

## 2019-03-03 MED ORDER — HEPARIN SOD (PORK) LOCK FLUSH 100 UNIT/ML IV SOLN
500.0000 [IU] | Freq: Once | INTRAVENOUS | Status: DC | PRN
  Filled 2019-03-03: qty 5

## 2019-03-03 MED ORDER — SODIUM CHLORIDE 0.9 % IV SOLN
2200.0000 mg/m2 | INTRAVENOUS | Status: DC
  Administered 2019-03-03: 4350 mg via INTRAVENOUS
  Filled 2019-03-03: qty 87

## 2019-03-03 MED ORDER — PALONOSETRON HCL INJECTION 0.25 MG/5ML
0.2500 mg | Freq: Once | INTRAVENOUS | Status: AC
  Administered 2019-03-03: 0.25 mg via INTRAVENOUS

## 2019-03-03 MED ORDER — DEXAMETHASONE SODIUM PHOSPHATE 10 MG/ML IJ SOLN
INTRAMUSCULAR | Status: AC
  Filled 2019-03-03: qty 1

## 2019-03-03 MED ORDER — SODIUM CHLORIDE 0.9% FLUSH
10.0000 mL | INTRAVENOUS | Status: DC | PRN
  Administered 2019-03-03: 10 mL
  Filled 2019-03-03: qty 10

## 2019-03-03 MED ORDER — PALONOSETRON HCL INJECTION 0.25 MG/5ML
INTRAVENOUS | Status: AC
  Filled 2019-03-03: qty 5

## 2019-03-03 MED ORDER — OXALIPLATIN CHEMO INJECTION 100 MG/20ML
85.0000 mg/m2 | Freq: Once | INTRAVENOUS | Status: AC
  Administered 2019-03-03: 165 mg via INTRAVENOUS
  Filled 2019-03-03: qty 33

## 2019-03-03 MED ORDER — SODIUM CHLORIDE 0.9% FLUSH
10.0000 mL | INTRAVENOUS | Status: DC | PRN
  Filled 2019-03-03: qty 10

## 2019-03-03 MED ORDER — LEUCOVORIN CALCIUM INJECTION 350 MG
400.0000 mg/m2 | Freq: Once | INTRAVENOUS | Status: AC
  Administered 2019-03-03: 788 mg via INTRAVENOUS
  Filled 2019-03-03: qty 39.4

## 2019-03-03 MED ORDER — DEXAMETHASONE SODIUM PHOSPHATE 10 MG/ML IJ SOLN
5.0000 mg | Freq: Once | INTRAMUSCULAR | Status: AC
  Administered 2019-03-03: 5 mg via INTRAVENOUS

## 2019-03-03 NOTE — Patient Instructions (Addendum)
[facility] Discharge Instructions for Patients Receiving Chemotherapy  Today you received the following chemotherapy agents: Oxaliplatin, leucovorin, 5FU   To help prevent nausea and vomiting after your treatment, we encourage you to take your nausea medication as directed.    If you develop nausea and vomiting that is not controlled by your nausea medication, call the clinic.   BELOW ARE SYMPTOMS THAT SHOULD BE REPORTED IMMEDIATELY:  *FEVER GREATER THAN 100.5 F  *CHILLS WITH OR WITHOUT FEVER  NAUSEA AND VOMITING THAT IS NOT CONTROLLED WITH YOUR NAUSEA MEDICATION  *UNUSUAL SHORTNESS OF BREATH  *UNUSUAL BRUISING OR BLEEDING  TENDERNESS IN MOUTH AND THROAT WITH OR WITHOUT PRESENCE OF ULCERS  *URINARY PROBLEMS  *BOWEL PROBLEMS  UNUSUAL RASH Items with * indicate a potential emergency and should be followed up as soon as possible.  Feel free to call the clinic should you have any questions or concerns. The clinic phone number is [phone].  Please show the CHEMO ALERT CARD at check-in to the Emergency Department and triage nurse.   

## 2019-03-03 NOTE — Progress Notes (Signed)
Per Dr. Burr Medico, patient is OK to treat today with current labs. Patient was given instruction to see primary physician and control diet due to high sugar. He verbalized understanding.

## 2019-03-05 ENCOUNTER — Other Ambulatory Visit: Payer: Self-pay

## 2019-03-05 ENCOUNTER — Inpatient Hospital Stay: Payer: Medicaid Other

## 2019-03-05 VITALS — BP 141/77 | HR 88 | Temp 98.7°F | Resp 18

## 2019-03-05 DIAGNOSIS — C182 Malignant neoplasm of ascending colon: Secondary | ICD-10-CM

## 2019-03-05 DIAGNOSIS — Z5111 Encounter for antineoplastic chemotherapy: Secondary | ICD-10-CM | POA: Diagnosis not present

## 2019-03-05 MED ORDER — SODIUM CHLORIDE 0.9% FLUSH
10.0000 mL | INTRAVENOUS | Status: DC | PRN
  Administered 2019-03-05: 10 mL
  Filled 2019-03-05: qty 10

## 2019-03-05 MED ORDER — HEPARIN SOD (PORK) LOCK FLUSH 100 UNIT/ML IV SOLN
500.0000 [IU] | Freq: Once | INTRAVENOUS | Status: AC | PRN
  Administered 2019-03-05: 500 [IU]
  Filled 2019-03-05: qty 5

## 2019-03-10 ENCOUNTER — Other Ambulatory Visit: Payer: Self-pay | Admitting: Family Medicine

## 2019-03-16 NOTE — Progress Notes (Signed)
[place]   [phone] [phone]) [phone]   Clinic Follow up Note   Patient Care Team: [name], [name], DO as PCP - General [name], MD as Consulting Physician (Colon and Rectal Surgery) [name], MD as Consulting Physician (Hematology)  Date of Service:  03/17/2019  CHIEF COMPLAINT: F/u on colon cancer   SUMMARY OF ONCOLOGIC HISTORY: Oncology History   Cancer Staging Cancer of right colon [facility]) Staging form: Colon and Rectum, AJCC 8th Edition - Pathologic stage from 10/21/2018: Stage IVC (pT4b, pN1a, pM1c) - Signed by [name], MD on 13/0/8657  Follicular lymphoma ([place]), History of Staging form: Lymphoid Neoplasms, AJCC 6th Edition - Clinical: Stage II - Signed by Curt Bears, MD on 8/46/9629       Follicular lymphoma [facility]), History of   11/2009 Initial Diagnosis    Follicular lymphoma ([place]), History of     Chemotherapy    1) Status post 6 cycles of systemic chemotherapy with CHOP/Rituxan last dose given 05/01/2009.  2) Maintenance Rituxan at 375 mg per meter square given every 2 months status post 12 cycles      Cancer of right colon ([place])   10/21/2018 Surgery    Exploratory laparotomy right hemicolectomy by Dr. [name] and Dr. [name]  10/21/18    10/21/2018 Pathology Results    Diagnosis 10/21/18 Colon, segmental resection for tumor, right ascending and appendix - ADENOCARCINOMA, MODERATE TO POORLY DIFFERENTIATED (4 CM) - METASTATIC CARCINOMA INVOLVING ONE OF EIGHTEEN LYMPH NODES (1/18) - CARCINOMA EXTENDS INTO THE APPENDIX - TWO TUMOR DEPOSITS PRESENT - SEE ONCOLOGY TABLE AND COMMENT BELOW    10/21/2018 Cancer Staging    Staging form: Colon and Rectum, AJCC 8th Edition - Pathologic stage from 10/21/2018: Stage IVC (pT4b, pN1a, pM1c) - Signed by [name], MD on 11/13/2018    11/04/2018 Imaging    CT AP W Contrast 11/04/18  IMPRESSION: 1. Interval appendectomy. There is residual soft tissue thickening and  stranding in the right lower quadrant adjacent to the cecum which may represent ongoing inflammation versus postsurgical changes. A small soft tissue density adjacent to the surgical sutures may reflect small hematoma or operative collection. No large focal fluid collection to suggest drainable abscess allowing for absence of contrast 2. Fluid-filled colon without wall thickening, could reflect diarrheal process    11/08/2018 Imaging    CT Chest W Contrast 11/08/18  IMPRESSION: 1. No acute findings are noted in the thorax to account for the patient's symptoms. 2. Aortic atherosclerosis, in addition to left main and 3 vessel coronary artery disease. Please note that although the presence of coronary artery calcium documents the presence of coronary artery disease, the severity of this disease and any potential stenosis cannot be assessed on this non-gated CT examination. Assessment for potential risk factor modification, dietary therapy or pharmacologic therapy may be warranted, if clinically indicated. 3. Additional incidental findings, as above. Aortic Atherosclerosis (ICD10-I70.0).    11/12/2018 Initial Diagnosis    Cancer of right colon ([place])    11/25/2018 -  Chemotherapy    FOLFOX q2weeks starting 11/25/18. Due to moderate thrombocytopenia, I will stop 5-FU bolus, and reduce pump infusion to 2226m/m2 starting with cycle 7.  Plan for last treatment on 04/28/19.     02/10/2019 Imaging    CT AP W Contrast  IMPRESSION: 1. No evidence metastatic disease. 2. Hepatic steatosis. 3. Mildly enlarged prostate. 4. Aortic atherosclerosis (ICD10-170.0). coronary artery calcification.      CURRENT THERAPY:  AdjuvantFOLFOX every 2 weeks  starting12/19/19. Dose reduced with cycle 7 dur to Thrombocytopenia. Plan for last treatment on 04/28/19.    INTERVAL HISTORY:  Blake Vaughn is here for a follow up and treatment. He presents to the clinic today with his Spanish Interpretor  Gregary Signs. He notes he is doing well. He notes skin color change in his palms and has numbness. He denies any neuropathy of his feet. He notes he has not been checking his BG at home. He does not have glucometer. He plans to see his PCP next week. He notes he eats more chicken and veggies and takes Herbal life with no sugar. He takes Metformin BID. He notes he take Glimepiride 39m. He notes he has been doing exercise and trying to lose some weight.     REVIEW OF SYSTEMS:   Constitutional: Denies fevers, chills or abnormal weight loss Eyes: Denies blurriness of vision Ears, nose, mouth, throat, and face: Denies mucositis or sore throat Respiratory: Denies cough, dyspnea or wheezes Cardiovascular: Denies palpitation, chest discomfort or lower extremity swelling Gastrointestinal:  Denies nausea, heartburn or change in bowel habits Skin: Denies abnormal skin rashes (+) Skin discoloration of hands and feet  Lymphatics: Denies new lymphadenopathy or easy bruising Neurological:Denies new weaknesses (+) Mild numbness of fingertips  Behavioral/Psych: Mood is stable, no new changes  All other systems were reviewed with the patient and are negative.  MEDICAL HISTORY:  Past Medical History:  Diagnosis Date  . Appendicitis 10/21/2018  . Diabetes mellitus   . [place]licular [place], 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   Qualifier: Diagnosis of  By: KDeatra InaMD, RSandy Salaam  . Hypercholesterolemia   . Hypertension 11/27/2011  . Lymphoma ([facility]    gets annual chemo last tx Feb 2013  . TOBACCO USE, QUIT 08/18/2009   Qualifier: Diagnosis of  By: BDrue Flirt MD, TMerrily Brittle     SURGICAL HISTORY: Past Surgical History:  Procedure Laterality Date  . ESOPHAGOGASTRODUODENOSCOPY (EGD) WITH PROPOFOL N/A 11/05/2018   Procedure: ESOPHAGOGASTRODUODENOSCOPY (EGD) WITH PROPOFOL;  Surgeon: BRonald Lobo MD;  [place] [facility]  Service: Endoscopy;  Laterality: N/A;  . IR IMAGING GUIDED PORT INSERTION  11/24/2018  .  LAPAROSCOPIC APPENDECTOMY N/A 10/21/2018   Procedure: Exploratory laparotomy right hemicolectomy;  Surgeon: WIleana Roup MD;  [place] [facility]  Service: General;  Laterality: N/A;    I have reviewed the social history and family history with the patient and they are unchanged from previous note.  ALLERGIES:  is allergic to iohexol.  MEDICATIONS:  Current Outpatient Medications  Medication Sig Dispense Refill  . lidocaine-prilocaine (EMLA) cream Apply to affected area once 30 g 3  . metFORMIN (GLUCOPHAGE) 1000 MG tablet TAKE 1 TABLET BY MOUTH TWICE DAILY WITH A MEAL 90 tablet 3  . prochlorperazine (COMPAZINE) 10 MG tablet Take 1 tablet (10 mg total) by mouth every 6 (six) hours as needed (Nausea or vomiting). 30 tablet 2   No current facility-administered medications for this visit.     PHYSICAL EXAMINATION: ECOG PERFORMANCE STATUS: 1 - Symptomatic but completely ambulatory  Vitals:   03/17/19 1132  BP: 132/71  Pulse: 71  Resp: 17  Temp: 98.5 F (36.9 C)  SpO2: 100%   Filed Weights   03/17/19 1132  Weight: 183 lb 3.2 oz (83.1 kg)    GENERAL:alert, no distress and comfortable SKIN: skin color, texture, turgor are normal, no rashes or significant lesions EYES: normal, Conjunctiva are pink and non-injected, sclera clear OROPHARYNX:no exudate, no erythema and lips, buccal mucosa, and tongue  normal  NECK: supple, thyroid normal size, non-tender, without nodularity LYMPH:  no palpable lymphadenopathy in the cervical, axillary or inguinal LUNGS: clear to auscultation and percussion with normal breathing effort HEART: regular rate & rhythm and no murmurs and no lower extremity edema ABDOMEN:abdomen soft, non-tender and normal bowel sounds Musculoskeletal:no cyanosis of digits and no clubbing  NEURO: alert & oriented x 3 with fluent speech, no focal motor/sensory deficits  LABORATORY DATA:  I have reviewed the data as listed CBC Latest Ref Rng & Units 03/17/2019  03/03/2019 02/17/2019  WBC 4.0 - 10.5 K/uL 6.9 5.6 6.4  Hemoglobin 13.0 - 17.0 g/dL 12.8(L) 13.2 13.6  Hematocrit 39.0 - 52.0 % 39.6 41.4 41.7  Platelets 150 - 400 K/uL 119(L) 94(L) 84(L)     CMP Latest Ref Rng & Units 03/03/2019 02/17/2019 02/02/2019  Glucose 70 - 99 mg/dL 280(H) 207(H) 337(H)  BUN 6 - 20 mg/dL _0 Creatinine 0.61 - 1.24 mg/dL 0.83 0.78 0.92  Sodium 135 - 145 mmol/L 136 135 134(L)  Potassium 3.5 - 5.1 mmol/L 4.1 4.0 4.6  Chloride 98 - 111 mmol/L 106 103 102  CO2 22 - 32 mmol/L _1 Calcium 8.9 - 10.3 mg/dL 8.6(L) 9.2 9.3  Total Protein 6.5 - 8.1 g/dL 6.2(L) 6.4(L) 6.4(L)  Total Bilirubin 0.3 - 1.2 mg/dL 0.5 0.5 0.4  Alkaline Phos 38 - 126 U/L 157(H) 108 114  AST 15 - 41 U/L 39 25 23  ALT 0 - 44 U/L 38 24 25      RADIOGRAPHIC STUDIES: I have personally reviewed the radiological images as listed and agreed with the findings in the report. No results found.   ASSESSMENT & PLAN:  AYUUB PENLEY is a 58 y.o. male with   1.RightCecal adenocarcinoma,pT4bN1aM1c,Stage IVC,with limited peritoneal metastasis,resected,Grade II-III, MMR normal -Diagnosed in 10/2018. Treated with right hemicolectomy.He was found to have 2 smallperitoneal metastasis during the surgery, which were removed. -He has started adjuvantFOLFOX q2weeks. Tolerating well. Due to moderate thrombocytopenia, I will stop 5-FU bolus, and reduce pump infusion to 2238m/m2 starting with cycle 7.  -He is clinically stable. He has mild numbness of fingertips and skin color change. Labs reviewed, CBC WNL except hg 12.8, Plt 119K, CMP WNL except BG 280. Iron panel is still pending. Overall adequate to proceed with FOLFOX today at same dose. Plan to completed 6 months treatment with last treatment on 04/28/19.  -His last CEA has increased to 7.03. I discussed this could also be nonspecific but if this continues to trend up, we will scan him sooner.  -Plan to rescan in 3-4 months, sooner if CEA  continue rising.  -I discussed keeping his PAC for 1-2 years given he is still at high risk for recurrence. He will continue flushes. He is agreeable.  -F/u in 4 weeks   2. H/o Follicular Lymphoma -Was treated with CHOP/Rituxan in 2010. Then he was previously treated with Maintenance Rituxan completed in 2012. Last f/u was in 2017.  3. DM and Hypercholesterolemia, uncontrolled hyperglycemia   -On metformin, Glimepiride, Lipitor and metoprolol -His blood glucose has been increased lately partially due to the premedication steroids -I previouslydecreasedhis pre-chemo dexamethasone from 10 mg to 5 mg, he receives every 2 weeks  -I again encouraged him to monitor his blood glucose at home. He notes he does not have glucometer at home. He will ask for one by his PCP.  -I encouraged him to watch and adjust his diet.  -Continue to f/u  with PCP, he will see PCP next week. I encouraged him to discuss adjusting his medication.   4. Financial Support -Medicaid pending -His wife is in [place]. Has 2 children and a brother that help his at home  5. Insomnia -Ipreviouslyrecommend Melatonin or Benadryl.  6.thrombocytopenia -secondary tochemotherapy, so I reduced chemo dose  -I will monitor. PLT at 119K today (03/17/19), improved    Plan -Labsreviewed, adequate for treatment, will proceedwithcycle 9 FOLFOXtoday same dose and continue every 2 weeks -Lab, flush, f/u and treatment in 4 weeks  -I encourage him to discuss better diabetic control at his appointment with PCP Dr. Enid Derry next week    No problem-specific Assessment & Plan notes found for this encounter.   No orders of the defined types were placed in this encounter.  All questions were answered. The patient knows to call the clinic with any problems, questions or concerns. No barriers to learning was detected. I spent 20 minutes counseling the patient face to face. The total time spent in the appointment was 25 minutes  and more than 50% was on counseling and review of test results     [name], MD 03/17/2019   I, Joslyn Devon, am acting as scribe for [name], MD.   I have reviewed the above documentation for accuracy and completeness, and I agree with the above.

## 2019-03-17 ENCOUNTER — Inpatient Hospital Stay: Payer: Medicaid Other

## 2019-03-17 ENCOUNTER — Inpatient Hospital Stay (HOSPITAL_BASED_OUTPATIENT_CLINIC_OR_DEPARTMENT_OTHER): Payer: Medicaid Other | Admitting: Hematology

## 2019-03-17 ENCOUNTER — Telehealth: Payer: Self-pay | Admitting: Hematology

## 2019-03-17 ENCOUNTER — Inpatient Hospital Stay: Payer: Medicaid Other | Attending: Hematology

## 2019-03-17 ENCOUNTER — Other Ambulatory Visit: Payer: Self-pay

## 2019-03-17 ENCOUNTER — Encounter: Payer: Self-pay | Admitting: Hematology

## 2019-03-17 VITALS — BP 132/71 | HR 71 | Temp 98.5°F | Resp 17 | Ht 60.0 in | Wt 183.2 lb

## 2019-03-17 DIAGNOSIS — C182 Malignant neoplasm of ascending colon: Secondary | ICD-10-CM | POA: Insufficient documentation

## 2019-03-17 DIAGNOSIS — E7800 Pure hypercholesterolemia, unspecified: Secondary | ICD-10-CM | POA: Insufficient documentation

## 2019-03-17 DIAGNOSIS — N400 Enlarged prostate without lower urinary tract symptoms: Secondary | ICD-10-CM | POA: Insufficient documentation

## 2019-03-17 DIAGNOSIS — Z8572 Personal history of non-Hodgkin lymphomas: Secondary | ICD-10-CM | POA: Insufficient documentation

## 2019-03-17 DIAGNOSIS — I700 Atherosclerosis of aorta: Secondary | ICD-10-CM | POA: Insufficient documentation

## 2019-03-17 DIAGNOSIS — R200 Anesthesia of skin: Secondary | ICD-10-CM | POA: Diagnosis not present

## 2019-03-17 DIAGNOSIS — I2510 Atherosclerotic heart disease of native coronary artery without angina pectoris: Secondary | ICD-10-CM | POA: Insufficient documentation

## 2019-03-17 DIAGNOSIS — R238 Other skin changes: Secondary | ICD-10-CM

## 2019-03-17 DIAGNOSIS — C786 Secondary malignant neoplasm of retroperitoneum and peritoneum: Secondary | ICD-10-CM | POA: Diagnosis not present

## 2019-03-17 DIAGNOSIS — Z7984 Long term (current) use of oral hypoglycemic drugs: Secondary | ICD-10-CM | POA: Insufficient documentation

## 2019-03-17 DIAGNOSIS — D500 Iron deficiency anemia secondary to blood loss (chronic): Secondary | ICD-10-CM

## 2019-03-17 DIAGNOSIS — D696 Thrombocytopenia, unspecified: Secondary | ICD-10-CM | POA: Insufficient documentation

## 2019-03-17 DIAGNOSIS — Z5111 Encounter for antineoplastic chemotherapy: Secondary | ICD-10-CM | POA: Diagnosis present

## 2019-03-17 DIAGNOSIS — C8200 Follicular lymphoma grade I, unspecified site: Secondary | ICD-10-CM

## 2019-03-17 DIAGNOSIS — Z79899 Other long term (current) drug therapy: Secondary | ICD-10-CM | POA: Diagnosis not present

## 2019-03-17 DIAGNOSIS — K760 Fatty (change of) liver, not elsewhere classified: Secondary | ICD-10-CM | POA: Insufficient documentation

## 2019-03-17 DIAGNOSIS — E1165 Type 2 diabetes mellitus with hyperglycemia: Secondary | ICD-10-CM | POA: Diagnosis not present

## 2019-03-17 DIAGNOSIS — Z95828 Presence of other vascular implants and grafts: Secondary | ICD-10-CM

## 2019-03-17 DIAGNOSIS — G4700 Insomnia, unspecified: Secondary | ICD-10-CM | POA: Insufficient documentation

## 2019-03-17 LAB — CBC WITH DIFFERENTIAL (CANCER CENTER ONLY)
Abs Immature Granulocytes: 0.01 10*3/uL (ref 0.00–0.07)
Basophils Absolute: 0 10*3/uL (ref 0.0–0.1)
Basophils Relative: 0 %
Eosinophils Absolute: 0.3 10*3/uL (ref 0.0–0.5)
Eosinophils Relative: 4 %
HCT: 39.6 % (ref 39.0–52.0)
Hemoglobin: 12.8 g/dL — ABNORMAL LOW (ref 13.0–17.0)
Immature Granulocytes: 0 %
Lymphocytes Relative: 30 %
Lymphs Abs: 2 10*3/uL (ref 0.7–4.0)
MCH: 27.8 pg (ref 26.0–34.0)
MCHC: 32.3 g/dL (ref 30.0–36.0)
MCV: 85.9 fL (ref 80.0–100.0)
Monocytes Absolute: 1.2 10*3/uL — ABNORMAL HIGH (ref 0.1–1.0)
Monocytes Relative: 17 %
Neutro Abs: 3.4 10*3/uL (ref 1.7–7.7)
Neutrophils Relative %: 49 %
Platelet Count: 119 10*3/uL — ABNORMAL LOW (ref 150–400)
RBC: 4.61 MIL/uL (ref 4.22–5.81)
RDW: 17 % — ABNORMAL HIGH (ref 11.5–15.5)
WBC Count: 6.9 10*3/uL (ref 4.0–10.5)
nRBC: 0 % (ref 0.0–0.2)

## 2019-03-17 LAB — CMP (CANCER CENTER ONLY)
ALT: 30 U/L (ref 0–44)
AST: 33 U/L (ref 15–41)
Albumin: 3.2 g/dL — ABNORMAL LOW (ref 3.5–5.0)
Alkaline Phosphatase: 123 U/L (ref 38–126)
Anion gap: 8 (ref 5–15)
BUN: 21 mg/dL — ABNORMAL HIGH (ref 6–20)
CO2: 22 mmol/L (ref 22–32)
Calcium: 8.2 mg/dL — ABNORMAL LOW (ref 8.9–10.3)
Chloride: 107 mmol/L (ref 98–111)
Creatinine: 0.77 mg/dL (ref 0.61–1.24)
GFR, Est AFR Am: >60 mL/min (ref >60)
GFR, Estimated: >60 mL/min (ref >60)
Glucose, Bld: 225 mg/dL — ABNORMAL HIGH (ref 70–99)
Potassium: 4.5 mmol/L (ref 3.5–5.1)
Sodium: 137 mmol/L (ref 135–145)
Total Bilirubin: 0.4 mg/dL (ref 0.3–1.2)
Total Protein: 6.3 g/dL — ABNORMAL LOW (ref 6.5–8.1)

## 2019-03-17 LAB — IRON AND TIBC
Iron: 68 ug/dL (ref 42–163)
Saturation Ratios: 15 % — ABNORMAL LOW (ref 20–55)
TIBC: 459 ug/dL — ABNORMAL HIGH (ref 202–409)
UIBC: 391 ug/dL — ABNORMAL HIGH (ref 117–376)

## 2019-03-17 LAB — FERRITIN: Ferritin: 35 ng/mL (ref 24–336)

## 2019-03-17 MED ORDER — DEXAMETHASONE SODIUM PHOSPHATE 10 MG/ML IJ SOLN
INTRAMUSCULAR | Status: AC
  Filled 2019-03-17: qty 1

## 2019-03-17 MED ORDER — DEXAMETHASONE SODIUM PHOSPHATE 10 MG/ML IJ SOLN
5.0000 mg | Freq: Once | INTRAMUSCULAR | Status: AC
  Administered 2019-03-17: 13:00:00 5 mg via INTRAVENOUS

## 2019-03-17 MED ORDER — DEXTROSE 5 % IV SOLN
Freq: Once | INTRAVENOUS | Status: AC
  Administered 2019-03-17: 13:00:00 via INTRAVENOUS
  Filled 2019-03-17: qty 250

## 2019-03-17 MED ORDER — SODIUM CHLORIDE 0.9 % IV SOLN
2200.0000 mg/m2 | INTRAVENOUS | Status: DC
  Administered 2019-03-17: 4350 mg via INTRAVENOUS
  Filled 2019-03-17: qty 87

## 2019-03-17 MED ORDER — SODIUM CHLORIDE 0.9% FLUSH
10.0000 mL | INTRAVENOUS | Status: DC | PRN
  Administered 2019-03-17: 10 mL
  Filled 2019-03-17: qty 10

## 2019-03-17 MED ORDER — OXALIPLATIN CHEMO INJECTION 100 MG/20ML
85.0000 mg/m2 | Freq: Once | INTRAVENOUS | Status: AC
  Administered 2019-03-17: 14:00:00 165 mg via INTRAVENOUS
  Filled 2019-03-17: qty 33

## 2019-03-17 MED ORDER — PALONOSETRON HCL INJECTION 0.25 MG/5ML
INTRAVENOUS | Status: AC
  Filled 2019-03-17: qty 5

## 2019-03-17 MED ORDER — LEUCOVORIN CALCIUM INJECTION 350 MG
400.0000 mg/m2 | Freq: Once | INTRAVENOUS | Status: AC
  Administered 2019-03-17: 14:00:00 788 mg via INTRAVENOUS
  Filled 2019-03-17: qty 39.4

## 2019-03-17 MED ORDER — PALONOSETRON HCL INJECTION 0.25 MG/5ML
0.2500 mg | Freq: Once | INTRAVENOUS | Status: AC
  Administered 2019-03-17: 13:00:00 0.25 mg via INTRAVENOUS

## 2019-03-17 MED ORDER — SODIUM CHLORIDE 0.9% FLUSH
10.0000 mL | INTRAVENOUS | Status: DC | PRN
  Filled 2019-03-17: qty 10

## 2019-03-17 NOTE — Telephone Encounter (Signed)
No los per 4/9. °

## 2019-03-17 NOTE — Patient Instructions (Signed)

## 2019-03-17 NOTE — Patient Instructions (Signed)
[facility] Discharge Instructions for Patients Receiving Chemotherapy  Today you received the following chemotherapy agents: Oxaliplatin, leucovorin, 5FU   To help prevent nausea and vomiting after your treatment, we encourage you to take your nausea medication as directed.    If you develop nausea and vomiting that is not controlled by your nausea medication, call the clinic.   BELOW ARE SYMPTOMS THAT SHOULD BE REPORTED IMMEDIATELY:  *FEVER GREATER THAN 100.5 F  *CHILLS WITH OR WITHOUT FEVER  NAUSEA AND VOMITING THAT IS NOT CONTROLLED WITH YOUR NAUSEA MEDICATION  *UNUSUAL SHORTNESS OF BREATH  *UNUSUAL BRUISING OR BLEEDING  TENDERNESS IN MOUTH AND THROAT WITH OR WITHOUT PRESENCE OF ULCERS  *URINARY PROBLEMS  *BOWEL PROBLEMS  UNUSUAL RASH Items with * indicate a potential emergency and should be followed up as soon as possible.  Feel free to call the clinic should you have any questions or concerns. The clinic phone number is [phone].  Please show the CHEMO ALERT CARD at check-in to the Emergency Department and triage nurse.   

## 2019-03-19 ENCOUNTER — Other Ambulatory Visit: Payer: Self-pay

## 2019-03-19 ENCOUNTER — Inpatient Hospital Stay: Payer: Medicaid Other

## 2019-03-19 VITALS — BP 146/71 | HR 84 | Temp 98.4°F | Resp 18

## 2019-03-19 DIAGNOSIS — C182 Malignant neoplasm of ascending colon: Secondary | ICD-10-CM

## 2019-03-19 MED ORDER — SODIUM CHLORIDE 0.9% FLUSH
10.0000 mL | INTRAVENOUS | Status: DC | PRN
  Administered 2019-03-19: 10 mL
  Filled 2019-03-19: qty 10

## 2019-03-19 MED ORDER — HEPARIN SOD (PORK) LOCK FLUSH 100 UNIT/ML IV SOLN
500.0000 [IU] | Freq: Once | INTRAVENOUS | Status: AC | PRN
  Administered 2019-03-19: 500 [IU]
  Filled 2019-03-19: qty 5

## 2019-03-23 ENCOUNTER — Encounter: Payer: Self-pay | Admitting: General Practice

## 2019-03-23 NOTE — Progress Notes (Signed)
[place] [phone]  [place] Team contacted patient to assess for food insecurity and other psychosocial needs during current COVID19 pandemic.    Patient/family expressed no needs at this time.  Support Team member encouraged patient to call if changes occur or they have any other questions/concerns.   Beverely Pace, [place]

## 2019-03-31 ENCOUNTER — Other Ambulatory Visit: Payer: Self-pay | Admitting: Hematology

## 2019-03-31 ENCOUNTER — Inpatient Hospital Stay: Payer: Medicaid Other

## 2019-03-31 ENCOUNTER — Other Ambulatory Visit: Payer: Self-pay

## 2019-03-31 VITALS — BP 129/73 | HR 77 | Temp 98.4°F | Resp 17 | Wt 184.5 lb

## 2019-03-31 DIAGNOSIS — C182 Malignant neoplasm of ascending colon: Secondary | ICD-10-CM | POA: Diagnosis not present

## 2019-03-31 DIAGNOSIS — Z95828 Presence of other vascular implants and grafts: Secondary | ICD-10-CM

## 2019-03-31 LAB — CMP (CANCER CENTER ONLY)
ALT: 27 U/L (ref 0–44)
AST: 34 U/L (ref 15–41)
Albumin: 3.1 g/dL — ABNORMAL LOW (ref 3.5–5.0)
Alkaline Phosphatase: 150 U/L — ABNORMAL HIGH (ref 38–126)
Anion gap: 9 (ref 5–15)
BUN: 19 mg/dL (ref 6–20)
CO2: 23 mmol/L (ref 22–32)
Calcium: 9 mg/dL (ref 8.9–10.3)
Chloride: 106 mmol/L (ref 98–111)
Creatinine: 0.77 mg/dL (ref 0.61–1.24)
GFR, Est AFR Am: >60 mL/min (ref >60)
GFR, Estimated: >60 mL/min (ref >60)
Glucose, Bld: 176 mg/dL — ABNORMAL HIGH (ref 70–99)
Potassium: 4.3 mmol/L (ref 3.5–5.1)
Sodium: 138 mmol/L (ref 135–145)
Total Bilirubin: 0.4 mg/dL (ref 0.3–1.2)
Total Protein: 6.5 g/dL (ref 6.5–8.1)

## 2019-03-31 LAB — CBC WITH DIFFERENTIAL (CANCER CENTER ONLY)
Abs Immature Granulocytes: 0 10*3/uL (ref 0.00–0.07)
Basophils Absolute: 0 10*3/uL (ref 0.0–0.1)
Basophils Relative: 0 %
Eosinophils Absolute: 0.3 10*3/uL (ref 0.0–0.5)
Eosinophils Relative: 4 %
HCT: 41.6 % (ref 39.0–52.0)
Hemoglobin: 13.3 g/dL (ref 13.0–17.0)
Immature Granulocytes: 0 %
Lymphocytes Relative: 32 %
Lymphs Abs: 1.9 10*3/uL (ref 0.7–4.0)
MCH: 27.4 pg (ref 26.0–34.0)
MCHC: 32 g/dL (ref 30.0–36.0)
MCV: 85.8 fL (ref 80.0–100.0)
Monocytes Absolute: 1 10*3/uL (ref 0.1–1.0)
Monocytes Relative: 17 %
Neutro Abs: 2.8 10*3/uL (ref 1.7–7.7)
Neutrophils Relative %: 47 %
Platelet Count: 84 10*3/uL — ABNORMAL LOW (ref 150–400)
RBC: 4.85 MIL/uL (ref 4.22–5.81)
RDW: 16.8 % — ABNORMAL HIGH (ref 11.5–15.5)
WBC Count: 5.9 10*3/uL (ref 4.0–10.5)
nRBC: 0 % (ref 0.0–0.2)

## 2019-03-31 MED ORDER — OXALIPLATIN CHEMO INJECTION 100 MG/20ML
60.0000 mg/m2 | Freq: Once | INTRAVENOUS | Status: AC
  Administered 2019-03-31: 120 mg via INTRAVENOUS
  Filled 2019-03-31: qty 20

## 2019-03-31 MED ORDER — DEXTROSE 5 % IV SOLN
Freq: Once | INTRAVENOUS | Status: AC
  Administered 2019-03-31: 11:00:00 via INTRAVENOUS
  Filled 2019-03-31: qty 250

## 2019-03-31 MED ORDER — DEXAMETHASONE SODIUM PHOSPHATE 10 MG/ML IJ SOLN
5.0000 mg | Freq: Once | INTRAMUSCULAR | Status: AC
  Administered 2019-03-31: 11:00:00 5 mg via INTRAVENOUS

## 2019-03-31 MED ORDER — PALONOSETRON HCL INJECTION 0.25 MG/5ML
0.2500 mg | Freq: Once | INTRAVENOUS | Status: AC
  Administered 2019-03-31: 11:00:00 0.25 mg via INTRAVENOUS

## 2019-03-31 MED ORDER — PALONOSETRON HCL INJECTION 0.25 MG/5ML
INTRAVENOUS | Status: AC
  Filled 2019-03-31: qty 5

## 2019-03-31 MED ORDER — SODIUM CHLORIDE 0.9% FLUSH
10.0000 mL | INTRAVENOUS | Status: DC | PRN
  Administered 2019-03-31: 10 mL
  Filled 2019-03-31: qty 10

## 2019-03-31 MED ORDER — LEUCOVORIN CALCIUM INJECTION 350 MG
400.0000 mg/m2 | Freq: Once | INTRAVENOUS | Status: AC
  Administered 2019-03-31: 12:00:00 788 mg via INTRAVENOUS
  Filled 2019-03-31: qty 39.4

## 2019-03-31 MED ORDER — SODIUM CHLORIDE 0.9 % IV SOLN
2200.0000 mg/m2 | INTRAVENOUS | Status: DC
  Administered 2019-03-31: 14:00:00 4350 mg via INTRAVENOUS
  Filled 2019-03-31: qty 87

## 2019-03-31 MED ORDER — DEXAMETHASONE SODIUM PHOSPHATE 10 MG/ML IJ SOLN
INTRAMUSCULAR | Status: AC
  Filled 2019-03-31: qty 1

## 2019-03-31 NOTE — Progress Notes (Signed)
Per Dr. Burr Medico, okay to treat patient today with Plt. Count of 84.

## 2019-03-31 NOTE — Progress Notes (Signed)
Patient aware of 12:15 pump dc on Sat

## 2019-03-31 NOTE — Patient Instructions (Signed)
[facility] Discharge Instructions for Patients Receiving Chemotherapy  Today you received the following chemotherapy agents Oxaliplatin (ELOXATIN), Leucovorin & Flourouracil (ADRUCIL).  To help prevent nausea and vomiting after your treatment, we encourage you to take your nausea medication as prescribed.  If you develop nausea and vomiting that is not controlled by your nausea medication, call the clinic.   BELOW ARE SYMPTOMS THAT SHOULD BE REPORTED IMMEDIATELY:  *FEVER GREATER THAN 100.5 F  *CHILLS WITH OR WITHOUT FEVER  NAUSEA AND VOMITING THAT IS NOT CONTROLLED WITH YOUR NAUSEA MEDICATION  *UNUSUAL SHORTNESS OF BREATH  *UNUSUAL BRUISING OR BLEEDING  TENDERNESS IN MOUTH AND THROAT WITH OR WITHOUT PRESENCE OF ULCERS  *URINARY PROBLEMS  *BOWEL PROBLEMS  UNUSUAL RASH Items with * indicate a potential emergency and should be followed up as soon as possible.  Feel free to call the clinic should you have any questions or concerns. The clinic phone number is [phone].  Please show the [place] at check-in to the Emergency Department and triage nurse.  Coronavirus (COVID-19) Are you at risk?  Are you at risk for the Coronavirus (COVID-19)?  To be considered HIGH RISK for Coronavirus (COVID-19), you have to meet the following criteria:  . Traveled to [place], [place], [place], [place] or [place]; or in the [place] to [place], [place], [place], or [place]; and have fever, cough, and shortness of breath within the last 2 weeks of travel OR . Been in close contact with a person diagnosed with COVID-19 within the last 2 weeks and have fever, cough, and shortness of breath . IF YOU DO NOT MEET THESE CRITERIA, YOU ARE CONSIDERED LOW RISK FOR COVID-19.  What to do if you are HIGH RISK for COVID-19?  Marland Kitchen If you are having a medical emergency, call 911. . Seek medical care right away. Before you go to a doctor's office, urgent care or emergency  department, call ahead and tell them about your recent travel, contact with someone diagnosed with COVID-19, and your symptoms. You should receive instructions from your physician's office regarding next steps of care.  . When you arrive at healthcare provider, tell the healthcare staff immediately you have returned from visiting [place], [place], [place], [place] or [place]; or traveled in the [place] to [place], [place], [place], or [place]; in the last two weeks or you have been in close contact with a person diagnosed with COVID-19 in the last 2 weeks.   . Tell the health care staff about your symptoms: fever, cough and shortness of breath. . After you have been seen by a medical provider, you will be either: o Tested for (COVID-19) and discharged home on quarantine except to seek medical care if symptoms worsen, and asked to  - Stay home and avoid contact with others until you get your results (4-5 days)  - Avoid travel on public transportation if possible (such as bus, train, or airplane) or o Sent to the Emergency Department by EMS for evaluation, COVID-19 testing, and possible admission depending on your condition and test results.  What to do if you are LOW RISK for COVID-19?  Reduce your risk of any infection by using the same precautions used for avoiding the common cold or flu:  Marland Kitchen Wash your hands often with soap and warm water for at least 20 seconds.  If soap and water are not readily available, use an alcohol-based hand sanitizer with at least 60% alcohol.  Marland Kitchen  If coughing or sneezing, cover your mouth and nose by coughing or sneezing into the elbow areas of your shirt or coat, into a tissue or into your sleeve (not your hands). . Avoid shaking hands with others and consider head nods or verbal greetings only. . Avoid touching your eyes, nose, or mouth with unwashed hands.  . Avoid close contact with people who are sick. . Avoid places or events with large numbers of people  in one location, like concerts or sporting events. . Carefully consider travel plans you have or are making. . If you are planning any travel outside or inside the [place], visit the CDC's Travelers' Health webpage for the latest health notices. . If you have some symptoms but not all symptoms, continue to monitor at home and seek medical attention if your symptoms worsen. . If you are having a medical emergency, call 911.   ADDITIONAL HEALTHCARE OPTIONS FOR PATIENTS  [facility] / e-Visit: [email or web address]         [facility]: [phone]  [facility]: [phone]                   [facility]: [phone]   

## 2019-03-31 NOTE — Progress Notes (Signed)
Spoke with patient in treatment area with Almyra Free Spanish Interpreter explained that due to his platelet count being low today Dr. Burr Medico is reducing his dose of chemotherapy.  She also is wanting to push his appointments back a week in order to give his body time to rebuild his platelets.  Reassured him low platelets are common in patients receiving chemotherapy.  His questions were answered and he verbalized an understanding of the plan.

## 2019-04-01 ENCOUNTER — Other Ambulatory Visit: Payer: Self-pay | Admitting: Medical Oncology

## 2019-04-02 ENCOUNTER — Inpatient Hospital Stay: Payer: Medicaid Other

## 2019-04-02 ENCOUNTER — Other Ambulatory Visit: Payer: Self-pay

## 2019-04-02 VITALS — BP 116/69 | HR 81 | Temp 98.7°F | Resp 16

## 2019-04-02 DIAGNOSIS — C182 Malignant neoplasm of ascending colon: Secondary | ICD-10-CM

## 2019-04-02 MED ORDER — SODIUM CHLORIDE 0.9% FLUSH
10.0000 mL | INTRAVENOUS | Status: DC | PRN
  Administered 2019-04-02: 10 mL
  Filled 2019-04-02: qty 10

## 2019-04-02 MED ORDER — HEPARIN SOD (PORK) LOCK FLUSH 100 UNIT/ML IV SOLN
500.0000 [IU] | Freq: Once | INTRAVENOUS | Status: AC | PRN
  Administered 2019-04-02: 500 [IU]
  Filled 2019-04-02: qty 5

## 2019-04-14 ENCOUNTER — Ambulatory Visit: Payer: Self-pay

## 2019-04-14 ENCOUNTER — Ambulatory Visit: Payer: Self-pay | Admitting: Hematology

## 2019-04-14 ENCOUNTER — Other Ambulatory Visit: Payer: Self-pay

## 2019-04-18 NOTE — Progress Notes (Signed)
Blake Vaughn   [phone] Fax:(336) [phone]   Clinic Follow up Note   Patient Care Team: [name], [name], DO as PCP - General [name], MD as Consulting Physician (Colon and Rectal Surgery) [name], MD as Consulting Physician (Hematology)  Date of Service:  04/21/2019  CHIEF COMPLAINT: F/u on colon cancer  SUMMARY OF ONCOLOGIC HISTORY: Oncology History   Cancer Staging Cancer of right colon [facility]) Staging form: Colon and Rectum, AJCC 8th Edition - Pathologic stage from 10/21/2018: Stage IVC (pT4b, pN1a, pM1c) - Signed by [name], MD on 75/05/4331  Follicular lymphoma ([place]), History of Staging form: Lymphoid Neoplasms, AJCC 6th Edition - Clinical: Stage II - Signed by Curt Bears, MD on 9/51/8841       Follicular lymphoma [facility]), History of   11/2009 Initial Diagnosis    Follicular lymphoma ([place]), History of     Chemotherapy    1) Status post 6 cycles of systemic chemotherapy with CHOP/Rituxan last dose given 05/01/2009.  2) Maintenance Rituxan at 375 mg per meter square given every 2 months status post 12 cycles      Cancer of right colon ([place])   10/21/2018 Surgery    Exploratory laparotomy right hemicolectomy by Dr. [name] and Dr. [name]  10/21/18    10/21/2018 Pathology Results    Diagnosis 10/21/18 Colon, segmental resection for tumor, right ascending and appendix - ADENOCARCINOMA, MODERATE TO POORLY DIFFERENTIATED (4 CM) - METASTATIC CARCINOMA INVOLVING ONE OF EIGHTEEN LYMPH NODES (1/18) - CARCINOMA EXTENDS INTO THE APPENDIX - TWO TUMOR DEPOSITS PRESENT - SEE ONCOLOGY TABLE AND COMMENT BELOW    10/21/2018 Cancer Staging    Staging form: Colon and Rectum, AJCC 8th Edition - Pathologic stage from 10/21/2018: Stage IVC (pT4b, pN1a, pM1c) - Signed by [name], MD on 11/13/2018    11/04/2018 Imaging    CT AP W Contrast 11/04/18  IMPRESSION: 1. Interval appendectomy. There is residual soft tissue thickening and  stranding in the right lower quadrant adjacent to the cecum which may represent ongoing inflammation versus postsurgical changes. A small soft tissue density adjacent to the surgical sutures may reflect small hematoma or operative collection. No large focal fluid collection to suggest drainable abscess allowing for absence of contrast 2. Fluid-filled colon without wall thickening, could reflect diarrheal process    11/08/2018 Imaging    CT Chest W Contrast 11/08/18  IMPRESSION: 1. No acute findings are noted in the thorax to account for the patient's symptoms. 2. Aortic atherosclerosis, in addition to left main and 3 vessel coronary artery disease. Please note that although the presence of coronary artery calcium documents the presence of coronary artery disease, the severity of this disease and any potential stenosis cannot be assessed on this non-gated CT examination. Assessment for potential risk factor modification, dietary therapy or pharmacologic therapy may be warranted, if clinically indicated. 3. Additional incidental findings, as above. Aortic Atherosclerosis (ICD10-I70.0).    11/12/2018 Initial Diagnosis    Cancer of right colon ([place])    11/25/2018 -  Chemotherapy    FOLFOX q2weeks starting 11/25/18. Due to moderate thrombocytopenia, I will stop 5-FU bolus, and reduce pump infusion to 2232m/m2 starting with cycle 7.  Plan for last treatment on 05/05/19.     02/10/2019 Imaging    CT AP W Contrast  IMPRESSION: 1. No evidence metastatic disease. 2. Hepatic steatosis. 3. Mildly enlarged prostate. 4. Aortic atherosclerosis (ICD10-170.0). coronary artery calcification.      CURRENT THERAPY:  AdjuvantFOLFOX every 2 weeks starting12/19/19.  Dose reduced with cycle 7 due to Thrombocytopenia. Plan for last treatment on 05/05/19.   INTERVAL HISTORY:  Blake Vaughn is here for a follow up and treatment. He presents to the clinic today by herself. He notes he is well and  tolerated last chemo well. He notes his neuropathy is mild to moderate. He does lose grip on objects but this is currently manageable. He does have pain on soles of his feet from the tingling and would like medication to help this.     REVIEW OF SYSTEMS:   Constitutional: Denies fevers, chills or abnormal weight loss Eyes: Denies blurriness of vision Ears, nose, mouth, throat, and face: Denies mucositis or sore throat Respiratory: Denies cough, dyspnea or wheezes Cardiovascular: Denies palpitation, chest discomfort or lower extremity swelling Gastrointestinal:  Denies nausea, heartburn or change in bowel habits Skin: Denies abnormal skin rashes Lymphatics: Denies new lymphadenopathy or easy bruising Neurological:Denies new weaknesses (+) Neuropathy  Behavioral/Psych: Mood is stable, no new changes  All other systems were reviewed with the patient and are negative.  MEDICAL HISTORY:  Past Medical History:  Diagnosis Date  . Appendicitis 10/21/2018  . Diabetes mellitus   . Follicular lymphoma ([place]), 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   Qualifier: Diagnosis of  By: [name], [name]   . Hypercholesterolemia   . Hypertension 11/27/2011  . Lymphoma [facility])    gets annual chemo last tx Feb 2013  . TOBACCO USE, QUIT 08/18/2009   Qualifier: Diagnosis of  By: Drue Flirt  MD, Merrily Brittle      SURGICAL HISTORY: Past Surgical History:  Procedure Laterality Date  . ESOPHAGOGASTRODUODENOSCOPY (EGD) WITH PROPOFOL N/A 11/05/2018   Procedure: ESOPHAGOGASTRODUODENOSCOPY (EGD) WITH PROPOFOL;  Surgeon: [name], MD;  Location: [place];  Service: Endoscopy;  Laterality: N/A;  . IR IMAGING GUIDED PORT INSERTION  11/24/2018  . LAPAROSCOPIC APPENDECTOMY N/A 10/21/2018   Procedure: Exploratory laparotomy right hemicolectomy;  Surgeon: [name], MD;  Location: [place];  Service: General;  Laterality: N/A;    I have reviewed the social history and family history with the patient and they are  unchanged from previous note.  ALLERGIES:  is allergic to iohexol.  MEDICATIONS:  Current Outpatient Medications  Medication Sig Dispense Refill  . lidocaine-prilocaine (EMLA) cream Apply to affected area once 30 g 3  . metFORMIN (GLUCOPHAGE) 1000 MG tablet TAKE 1 TABLET BY MOUTH TWICE DAILY WITH A MEAL 90 tablet 3  . gabapentin (NEURONTIN) 100 MG capsule Take 1 capsule (100 mg total) by mouth 3 (three) times daily. Start at 1 cap at night, and increase to 2 and 3 caps at night every 5-7 days, if tolerates well. Take 1-2 tab twice daily as needed during day 90 capsule 1  . prochlorperazine (COMPAZINE) 10 MG tablet Take 1 tablet (10 mg total) by mouth every 6 (six) hours as needed (Nausea or vomiting). 30 tablet 2   No current facility-administered medications for this visit.    Facility-Administered Medications Ordered in Other Visits  Medication Dose Route Frequency Provider Last Rate Last Dose  . fluorouracil (ADRUCIL) 4,350 mg in sodium chloride 0.9 % 63 mL chemo infusion  2,200 mg/m2 (Treatment Plan Recorded) Intravenous 1 day or 1 dose [name], MD   4,350 mg at 04/21/19 1256  . sodium chloride flush (NS) 0.9 % injection 10 mL  10 mL Intracatheter PRN [name], MD        PHYSICAL EXAMINATION: ECOG PERFORMANCE STATUS: 1 - Symptomatic but completely ambulatory  Vitals:  04/21/19 0903  BP: 134/76  Pulse: 80  Resp: 18  Temp: 98.9 F (37.2 C)  SpO2: 100%   Filed Weights   04/21/19 0903  Weight: 183 lb 6.4 oz (83.2 kg)    GENERAL:alert, no distress and comfortable SKIN: skin color, texture, turgor are normal, no rashes or significant lesions EYES: normal, Conjunctiva are pink and non-injected, sclera clear OROPHARYNX:no exudate, no erythema and lips, buccal mucosa, and tongue normal  NECK: supple, thyroid normal size, non-tender, without nodularity LYMPH:  no palpable lymphadenopathy in the cervical, axillary or inguinal LUNGS: clear to auscultation and percussion with  normal breathing effort HEART: regular rate & rhythm and no murmurs and no lower extremity edema ABDOMEN:abdomen soft, non-tender and normal bowel sounds Musculoskeletal:no cyanosis of digits and no clubbing  NEURO: alert & oriented x 3 with fluent speech, no focal motor/sensory deficits  LABORATORY DATA:  I have reviewed the data as listed CBC Latest Ref Rng & Units 04/21/2019 03/31/2019 03/17/2019  WBC 4.0 - 10.5 K/uL 5.6 5.9 6.9  Hemoglobin 13.0 - 17.0 g/dL 14.4 13.3 12.8(L)  Hematocrit 39.0 - 52.0 % 44.1 41.6 39.6  Platelets 150 - 400 K/uL 111(L) 84(L) 119(L)     CMP Latest Ref Rng & Units 04/21/2019 03/31/2019 03/17/2019  Glucose 70 - 99 mg/dL 217(H) 176(H) 225(H)  BUN 6 - 20 mg/dL 16 19 21(H)  Creatinine 0.61 - 1.24 mg/dL 0.84 0.77 0.77  Sodium 135 - 145 mmol/L 137 138 137  Potassium 3.5 - 5.1 mmol/L 4.2 4.3 4.5  Chloride 98 - 111 mmol/L 106 106 107  CO2 22 - 32 mmol/L _0 Calcium 8.9 - 10.3 mg/dL 9.2 9.0 8.2(L)  Total Protein 6.5 - 8.1 g/dL 6.5 6.5 6.3(L)  Total Bilirubin 0.3 - 1.2 mg/dL 0.5 0.4 0.4  Alkaline Phos 38 - 126 U/L 142(H) 150(H) 123  AST 15 - 41 U/L 31 34 33  ALT 0 - 44 U/L _1 RADIOGRAPHIC STUDIES: I have personally reviewed the radiological images as listed and agreed with the findings in the report. No results found.   ASSESSMENT & PLAN:  DERVIN VORE is a 58 y.o. male with   1.RightCecal adenocarcinoma,pT4bN1aM1c,Stage IVC,with limited peritoneal metastasis,resected,Grade II-III, MMR normal -Diagnosed in 10/2018. Treated with right hemicolectomy.He was found to have 2 smallperitoneal metastasis during the surgery, which were removed. -He has started adjuvantFOLFOX q2weeks. Tolerating well. Due to moderate thrombocytopenia, I will stop 5-FU bolus, and reduce pumpinfusion to 2260m/m2starting with cycle 7.  -I discussed keeping his PAC for 1-2 years given he is still at high risk for recurrence. He will continue flushes. He  is agreeable.  -He is clinically stable. He has numbness of fingers and tingling of soles of feet, overall mild.  -Labs reviewed, CBC and CMP WNL except PLT 111K, BG 217, albumin 3.3, Alk Phos 142. Iron panel and CEA are still pending. Overall adequate to proceed with FOLFOX today at reduced dose. Plan to completed 6 months treatment with last treatment on 05/05/19.  -Plan to rescan in 3 months, sooner if CEA continue rising.  -I discussed the risk of cancer recurrence in the future. I discussed the surveillance plan, which is a physical exam and lab test (including CBC, CMP and CEA) every 3 months for the first 2 years, then every 6-12 months, colonoscopy in one year, and surveillance CT scan every 6-12 month for up to 5 year.  -F/u in 2 weeks before last cycle  chemo    2. H/o Follicular Lymphoma -Was treated with CHOP/Rituxan in 2010. Then he was previously treated with Maintenance Rituxan completed in 2012. Last f/u was in 2017.  3. DM and Hypercholesterolemia, uncontrolled hyperglycemia   -On metformin, Glimepiride, Lipitor and metoprolol -His blood glucose has been increased lately partially due to the premedication steroids -I previouslydecreasedhis pre-chemo dexamethasone from 10 mg to 5 mg, he receives every 2 weeks  -I again encouraged him to monitor his blood glucose at home. He notes he does not have glucometer at home. He will ask for one by his PCP.  -I encouraged him to watch and adjust his diet.  -Continue to f/u with PCP, he will see PCP next week. I encouraged him to discuss adjusting his medication.   4. Financial Support -Medicaid pending -His wife is in [place]. Has 2 children andabrother that help his at home  5. Insomnia -Ipreviouslyrecommend Melatonin or Benadryl.  6.Thrombocytopenia -secondary tochemotherapy, so I reduced chemo dose -I will monitor. PLT at 111K today (04/21/19), stable.  7. 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  -I will call in Gabapentin for his tingling. He will start at 1 capsule at night and increase up to 3 nightly over a week and then add 1 capsule at a time during the day if needed.    Plan -I prescribed Gabapentin today, he will titrate the dose up to 328m tid if needed   -Labsreviewed, adequate for treatment, will proceedwith FOLFOXtoday at reduced dose and last dose in 2 weeks, may hold oxalliplatin next cycle if neuropathy gets worse   -f/u in 2 weeks      No problem-specific Assessment & Plan notes found for this encounter.   No orders of the defined types were placed in this encounter.  All questions were answered. The patient knows to call the clinic with any problems, questions or concerns. No barriers to learning was detected. I spent 20 minutes counseling the patient face to face. The total time spent in the appointment was 25 minutes and more than 50% was on counseling and review of test results     YTruitt [name] MD 04/21/2019   I, AJoslyn Devon am acting as scribe for YTruitt [name] MD.   I have reviewed the above documentation for accuracy and completeness, and I agree with the above.

## 2019-04-21 ENCOUNTER — Encounter: Payer: Self-pay | Admitting: Hematology

## 2019-04-21 ENCOUNTER — Telehealth: Payer: Self-pay

## 2019-04-21 ENCOUNTER — Telehealth: Payer: Self-pay | Admitting: Hematology

## 2019-04-21 ENCOUNTER — Inpatient Hospital Stay (HOSPITAL_BASED_OUTPATIENT_CLINIC_OR_DEPARTMENT_OTHER): Payer: Medicaid Other | Admitting: Hematology

## 2019-04-21 ENCOUNTER — Inpatient Hospital Stay: Payer: Medicaid Other

## 2019-04-21 ENCOUNTER — Other Ambulatory Visit: Payer: Self-pay | Admitting: Hematology

## 2019-04-21 ENCOUNTER — Other Ambulatory Visit: Payer: Self-pay

## 2019-04-21 ENCOUNTER — Inpatient Hospital Stay: Payer: Medicaid Other | Attending: Hematology

## 2019-04-21 VITALS — BP 134/76 | HR 80 | Temp 98.9°F | Resp 18 | Ht 60.0 in | Wt 183.4 lb

## 2019-04-21 DIAGNOSIS — I700 Atherosclerosis of aorta: Secondary | ICD-10-CM | POA: Diagnosis not present

## 2019-04-21 DIAGNOSIS — E7800 Pure hypercholesterolemia, unspecified: Secondary | ICD-10-CM | POA: Diagnosis not present

## 2019-04-21 DIAGNOSIS — Z5111 Encounter for antineoplastic chemotherapy: Secondary | ICD-10-CM | POA: Diagnosis not present

## 2019-04-21 DIAGNOSIS — C182 Malignant neoplasm of ascending colon: Secondary | ICD-10-CM

## 2019-04-21 DIAGNOSIS — Z8572 Personal history of non-Hodgkin lymphomas: Secondary | ICD-10-CM

## 2019-04-21 DIAGNOSIS — D696 Thrombocytopenia, unspecified: Secondary | ICD-10-CM | POA: Diagnosis not present

## 2019-04-21 DIAGNOSIS — G4700 Insomnia, unspecified: Secondary | ICD-10-CM | POA: Diagnosis not present

## 2019-04-21 DIAGNOSIS — N400 Enlarged prostate without lower urinary tract symptoms: Secondary | ICD-10-CM | POA: Insufficient documentation

## 2019-04-21 DIAGNOSIS — C786 Secondary malignant neoplasm of retroperitoneum and peritoneum: Secondary | ICD-10-CM

## 2019-04-21 DIAGNOSIS — E1165 Type 2 diabetes mellitus with hyperglycemia: Secondary | ICD-10-CM | POA: Insufficient documentation

## 2019-04-21 DIAGNOSIS — Z7984 Long term (current) use of oral hypoglycemic drugs: Secondary | ICD-10-CM | POA: Insufficient documentation

## 2019-04-21 DIAGNOSIS — I2510 Atherosclerotic heart disease of native coronary artery without angina pectoris: Secondary | ICD-10-CM | POA: Insufficient documentation

## 2019-04-21 DIAGNOSIS — R200 Anesthesia of skin: Secondary | ICD-10-CM

## 2019-04-21 DIAGNOSIS — Z79899 Other long term (current) drug therapy: Secondary | ICD-10-CM | POA: Insufficient documentation

## 2019-04-21 DIAGNOSIS — D500 Iron deficiency anemia secondary to blood loss (chronic): Secondary | ICD-10-CM

## 2019-04-21 DIAGNOSIS — R238 Other skin changes: Secondary | ICD-10-CM

## 2019-04-21 DIAGNOSIS — K760 Fatty (change of) liver, not elsewhere classified: Secondary | ICD-10-CM | POA: Diagnosis not present

## 2019-04-21 DIAGNOSIS — Z95828 Presence of other vascular implants and grafts: Secondary | ICD-10-CM

## 2019-04-21 LAB — CBC WITH DIFFERENTIAL (CANCER CENTER ONLY)
Abs Immature Granulocytes: 0.01 10*3/uL (ref 0.00–0.07)
Basophils Absolute: 0 10*3/uL (ref 0.0–0.1)
Basophils Relative: 0 %
Eosinophils Absolute: 0.2 10*3/uL (ref 0.0–0.5)
Eosinophils Relative: 4 %
HCT: 44.1 % (ref 39.0–52.0)
Hemoglobin: 14.4 g/dL (ref 13.0–17.0)
Immature Granulocytes: 0 %
Lymphocytes Relative: 30 %
Lymphs Abs: 1.7 10*3/uL (ref 0.7–4.0)
MCH: 28.6 pg (ref 26.0–34.0)
MCHC: 32.7 g/dL (ref 30.0–36.0)
MCV: 87.5 fL (ref 80.0–100.0)
Monocytes Absolute: 0.8 10*3/uL (ref 0.1–1.0)
Monocytes Relative: 15 %
Neutro Abs: 2.9 10*3/uL (ref 1.7–7.7)
Neutrophils Relative %: 51 %
Platelet Count: 111 10*3/uL — ABNORMAL LOW (ref 150–400)
RBC: 5.04 MIL/uL (ref 4.22–5.81)
RDW: 16.2 % — ABNORMAL HIGH (ref 11.5–15.5)
WBC Count: 5.6 10*3/uL (ref 4.0–10.5)
nRBC: 0 % (ref 0.0–0.2)

## 2019-04-21 LAB — CMP (CANCER CENTER ONLY)
ALT: 30 U/L (ref 0–44)
AST: 31 U/L (ref 15–41)
Albumin: 3.3 g/dL — ABNORMAL LOW (ref 3.5–5.0)
Alkaline Phosphatase: 142 U/L — ABNORMAL HIGH (ref 38–126)
Anion gap: 9 (ref 5–15)
BUN: 16 mg/dL (ref 6–20)
CO2: 22 mmol/L (ref 22–32)
Calcium: 9.2 mg/dL (ref 8.9–10.3)
Chloride: 106 mmol/L (ref 98–111)
Creatinine: 0.84 mg/dL (ref 0.61–1.24)
GFR, Est AFR Am: >60 mL/min (ref >60)
GFR, Estimated: >60 mL/min (ref >60)
Glucose, Bld: 217 mg/dL — ABNORMAL HIGH (ref 70–99)
Potassium: 4.2 mmol/L (ref 3.5–5.1)
Sodium: 137 mmol/L (ref 135–145)
Total Bilirubin: 0.5 mg/dL (ref 0.3–1.2)
Total Protein: 6.5 g/dL (ref 6.5–8.1)

## 2019-04-21 LAB — CEA (IN HOUSE-CHCC): CEA (CHCC-In House): 6.56 ng/mL — ABNORMAL HIGH (ref 0.00–5.00)

## 2019-04-21 LAB — IRON AND TIBC
Iron: 74 ug/dL (ref 42–163)
Saturation Ratios: 16 % — ABNORMAL LOW (ref 20–55)
TIBC: 474 ug/dL — ABNORMAL HIGH (ref 202–409)
UIBC: 400 ug/dL — ABNORMAL HIGH (ref 117–376)

## 2019-04-21 LAB — FERRITIN: Ferritin: 25 ng/mL (ref 24–336)

## 2019-04-21 MED ORDER — PROCHLORPERAZINE MALEATE 10 MG PO TABS: 10.0000 mg | ORAL_TABLET | Freq: Four times a day (QID) | ORAL | 2 refills | Status: DC | PRN

## 2019-04-21 MED ORDER — DEXTROSE 5 % IV SOLN
Freq: Once | INTRAVENOUS | Status: AC
  Administered 2019-04-21: 10:00:00 via INTRAVENOUS
  Filled 2019-04-21: qty 250

## 2019-04-21 MED ORDER — GABAPENTIN 100 MG PO CAPS: 100.0000 mg | ORAL_CAPSULE | Freq: Three times a day (TID) | ORAL | 1 refills | Status: DC

## 2019-04-21 MED ORDER — SODIUM CHLORIDE 0.9 % IV SOLN
2200.0000 mg/m2 | INTRAVENOUS | Status: DC
  Administered 2019-04-21: 13:00:00 4350 mg via INTRAVENOUS
  Filled 2019-04-21: qty 87

## 2019-04-21 MED ORDER — LEUCOVORIN CALCIUM INJECTION 350 MG
400.0000 mg/m2 | Freq: Once | INTRAVENOUS | Status: AC
  Administered 2019-04-21: 11:00:00 788 mg via INTRAVENOUS
  Filled 2019-04-21: qty 39.4

## 2019-04-21 MED ORDER — PALONOSETRON HCL INJECTION 0.25 MG/5ML
INTRAVENOUS | Status: AC
  Filled 2019-04-21: qty 5

## 2019-04-21 MED ORDER — DEXAMETHASONE SODIUM PHOSPHATE 10 MG/ML IJ SOLN
INTRAMUSCULAR | Status: AC
  Filled 2019-04-21: qty 1

## 2019-04-21 MED ORDER — DEXAMETHASONE SODIUM PHOSPHATE 10 MG/ML IJ SOLN
5.0000 mg | Freq: Once | INTRAMUSCULAR | Status: AC
  Administered 2019-04-21: 10:00:00 5 mg via INTRAVENOUS

## 2019-04-21 MED ORDER — SODIUM CHLORIDE 0.9% FLUSH
10.0000 mL | INTRAVENOUS | Status: DC | PRN
  Administered 2019-04-21: 10 mL
  Filled 2019-04-21: qty 10

## 2019-04-21 MED ORDER — SODIUM CHLORIDE 0.9% FLUSH
10.0000 mL | INTRAVENOUS | Status: DC | PRN
  Filled 2019-04-21: qty 10

## 2019-04-21 MED ORDER — PALONOSETRON HCL INJECTION 0.25 MG/5ML
0.2500 mg | Freq: Once | INTRAVENOUS | Status: AC
  Administered 2019-04-21: 10:00:00 0.25 mg via INTRAVENOUS

## 2019-04-21 MED ORDER — OXALIPLATIN CHEMO INJECTION 100 MG/20ML
60.0000 mg/m2 | Freq: Once | INTRAVENOUS | Status: AC
  Administered 2019-04-21: 11:00:00 120 mg via INTRAVENOUS
  Filled 2019-04-21: qty 24

## 2019-04-21 MED FILL — PROCHLORPERAZINE 10 MG TAB: 10 | 8 days supply | Qty: 30 | Fill #0

## 2019-04-21 MED FILL — GABAPENTIN 100 MG CAPSULE: 100 | 30 days supply | Qty: 90 | Fill #0

## 2019-04-21 NOTE — Patient Instructions (Signed)
[facility] Discharge Instructions for Patients Receiving Chemotherapy  Today you received the following chemotherapy agents Oxaliplatin, Leucovorin, Fluorouracil  To help prevent nausea and vomiting after your treatment, we encourage you to take your nausea medication: As directed by MD   If you develop nausea and vomiting that is not controlled by your nausea medication, call the clinic.   BELOW ARE SYMPTOMS THAT SHOULD BE REPORTED IMMEDIATELY:  *FEVER GREATER THAN 100.5 F  *CHILLS WITH OR WITHOUT FEVER  NAUSEA AND VOMITING THAT IS NOT CONTROLLED WITH YOUR NAUSEA MEDICATION  *UNUSUAL SHORTNESS OF BREATH  *UNUSUAL BRUISING OR BLEEDING  TENDERNESS IN MOUTH AND THROAT WITH OR WITHOUT PRESENCE OF ULCERS  *URINARY PROBLEMS  *BOWEL PROBLEMS  UNUSUAL RASH Items with * indicate a potential emergency and should be followed up as soon as possible.  Feel free to call the clinic should you have any questions or concerns. The clinic phone number is [phone].  Please show the [facility] at check-in to the Emergency Department and triage nurse.   Coronavirus (COVID-19) Are you at risk?  Are you at risk for the Coronavirus (COVID-19)?  To be considered HIGH RISK for Coronavirus (COVID-19), you have to meet the following criteria:  . Traveled to [place], [place], [place], [place] or [place]; or in the [place] to [place], [place], [place], or [place]; and have fever, cough, and shortness of breath within the last 2 weeks of travel OR . Been in close contact with a person diagnosed with COVID-19 within the last 2 weeks and have fever, cough, and shortness of breath . IF YOU DO NOT MEET THESE CRITERIA, YOU ARE CONSIDERED LOW RISK FOR COVID-19.  What to do if you are HIGH RISK for COVID-19?  Marland Kitchen If you are having a medical emergency, call 911. . Seek medical care right away. Before you go to a doctor's office, urgent care or emergency department, call  ahead and tell them about your recent travel, contact with someone diagnosed with COVID-19, and your symptoms. You should receive instructions from your physician's office regarding next steps of care.  . When you arrive at healthcare provider, tell the healthcare staff immediately you have returned from visiting [place], [place], [place], [place] or [place]; or traveled in the [place] to [place], [place], [place], or [place]; in the last two weeks or you have been in close contact with a person diagnosed with COVID-19 in the last 2 weeks.   . Tell the health care staff about your symptoms: fever, cough and shortness of breath. . After you have been seen by a medical provider, you will be either: o Tested for (COVID-19) and discharged home on quarantine except to seek medical care if symptoms worsen, and asked to  - Stay home and avoid contact with others until you get your results (4-5 days)  - Avoid travel on public transportation if possible (such as bus, train, or airplane) or o Sent to the Emergency Department by EMS for evaluation, COVID-19 testing, and possible admission depending on your condition and test results.  What to do if you are LOW RISK for COVID-19?  Reduce your risk of any infection by using the same precautions used for avoiding the common cold or flu:  Marland Kitchen Wash your hands often with soap and warm water for at least 20 seconds.  If soap and water are not readily available, use an alcohol-based hand sanitizer with at least 60% alcohol.  Marland Kitchen  If coughing or sneezing, cover your mouth and nose by coughing or sneezing into the elbow areas of your shirt or coat, into a tissue or into your sleeve (not your hands). . Avoid shaking hands with others and consider head nods or verbal greetings only. . Avoid touching your eyes, nose, or mouth with unwashed hands.  . Avoid close contact with people who are sick. . Avoid places or events with large numbers of people in one location,  like concerts or sporting events. . Carefully consider travel plans you have or are making. . If you are planning any travel outside or inside the [place], visit the CDC's Travelers' Health webpage for the latest health notices. . If you have some symptoms but not all symptoms, continue to monitor at home and seek medical attention if your symptoms worsen. . If you are having a medical emergency, call 911.   [place] / e-Visit: [email or web address]         [facility]: [facility]: [phone]                   [facility]: [phone]

## 2019-04-21 NOTE — Telephone Encounter (Signed)
Scheduled appt per 5/14 los. ° °A calendar will be mailed out. °

## 2019-04-21 NOTE — Telephone Encounter (Signed)
Faxed script for gabapentin to [facility], sent to HIM for scan to chart.

## 2019-04-23 ENCOUNTER — Other Ambulatory Visit: Payer: Self-pay

## 2019-04-23 ENCOUNTER — Inpatient Hospital Stay: Payer: Medicaid Other

## 2019-04-23 VITALS — BP 122/68 | HR 83 | Temp 98.9°F | Resp 16

## 2019-04-23 DIAGNOSIS — Z5111 Encounter for antineoplastic chemotherapy: Secondary | ICD-10-CM | POA: Diagnosis not present

## 2019-04-23 DIAGNOSIS — C182 Malignant neoplasm of ascending colon: Secondary | ICD-10-CM

## 2019-04-23 MED ORDER — SODIUM CHLORIDE 0.9% FLUSH
10.0000 mL | INTRAVENOUS | Status: DC | PRN
  Administered 2019-04-23: 10 mL
  Filled 2019-04-23: qty 10

## 2019-04-23 MED ORDER — HEPARIN SOD (PORK) LOCK FLUSH 100 UNIT/ML IV SOLN
500.0000 [IU] | Freq: Once | INTRAVENOUS | Status: AC | PRN
  Administered 2019-04-23: 500 [IU]
  Filled 2019-04-23: qty 5

## 2019-04-23 NOTE — Patient Instructions (Signed)

## 2019-04-26 ENCOUNTER — Telehealth: Payer: Self-pay | Admitting: *Deleted

## 2019-04-26 NOTE — Telephone Encounter (Signed)
Julia/Interpreter informed nurse that pt has worsening pain in his feet after chemotherapy.  Pt recently started on Neurontin.  Informed to cont neurontin, wear shoes & be careful not to injure his feet.  Asked that he f/u next week if pain not better.  Encouraged to try tylenol.  Gregary Signs will discuss with pt.

## 2019-04-28 ENCOUNTER — Ambulatory Visit: Payer: Self-pay | Admitting: Hematology

## 2019-04-28 ENCOUNTER — Ambulatory Visit: Payer: Self-pay

## 2019-04-28 ENCOUNTER — Other Ambulatory Visit: Payer: Self-pay

## 2019-05-04 NOTE — Progress Notes (Signed)
Blake Vaughn   [phone] Fax:([phone]) [phone]   Clinic Follow up Note   Patient Care Team: [name], [name], DO as PCP - General [name], MD as Consulting Physician (Colon and Rectal Surgery) [name], MD as Consulting Physician (Hematology)  Date of Service:  05/05/2019  CHIEF COMPLAINT: F/u on colon cancer  SUMMARY OF ONCOLOGIC HISTORY: Oncology History   Cancer Staging Cancer of right colon [facility]) Staging form: Colon and Rectum, AJCC 8th Edition - Pathologic stage from 10/21/2018: Stage IVC (pT4b, pN1a, pM1c) - Signed by [name], MD on 32/08/9241  Follicular lymphoma ([place]), History of Staging form: Lymphoid Neoplasms, AJCC 6th Edition - Clinical: Stage II - Signed by Curt Bears, MD on 6/83/4196       Follicular lymphoma [facility]), History of   11/2009 Initial Diagnosis    Follicular lymphoma ([place]), History of     Chemotherapy    1) Status post 6 cycles of systemic chemotherapy with CHOP/Rituxan last dose given 05/01/2009.  2) Maintenance Rituxan at 375 mg per meter square given every 2 months status post 12 cycles      Cancer of right colon ([place])   10/21/2018 Surgery    Exploratory laparotomy right hemicolectomy by Dr. [name] and Dr. [name]  10/21/18    10/21/2018 Pathology Results    Diagnosis 10/21/18 Colon, segmental resection for tumor, right ascending and appendix - ADENOCARCINOMA, MODERATE TO POORLY DIFFERENTIATED (4 CM) - METASTATIC CARCINOMA INVOLVING ONE OF EIGHTEEN LYMPH NODES (1/18) - CARCINOMA EXTENDS INTO THE APPENDIX - TWO TUMOR DEPOSITS PRESENT - SEE ONCOLOGY TABLE AND COMMENT BELOW    10/21/2018 Cancer Staging    Staging form: Colon and Rectum, AJCC 8th Edition - Pathologic stage from 10/21/2018: Stage IVC (pT4b, pN1a, pM1c) - Signed by [name], MD on 11/13/2018    11/04/2018 Imaging    CT AP W Contrast 11/04/18  IMPRESSION: 1. Interval appendectomy. There is residual soft tissue thickening and  stranding in the right lower quadrant adjacent to the cecum which may represent ongoing inflammation versus postsurgical changes. A small soft tissue density adjacent to the surgical sutures may reflect small hematoma or operative collection. No large focal fluid collection to suggest drainable abscess allowing for absence of contrast 2. Fluid-filled colon without wall thickening, could reflect diarrheal process    11/08/2018 Imaging    CT Chest W Contrast 11/08/18  IMPRESSION: 1. No acute findings are noted in the thorax to account for the patient's symptoms. 2. Aortic atherosclerosis, in addition to left main and 3 vessel coronary artery disease. Please note that although the presence of coronary artery calcium documents the presence of coronary artery disease, the severity of this disease and any potential stenosis cannot be assessed on this non-gated CT examination. Assessment for potential risk factor modification, dietary therapy or pharmacologic therapy may be warranted, if clinically indicated. 3. Additional incidental findings, as above. Aortic Atherosclerosis (ICD10-I70.0).    11/12/2018 Initial Diagnosis    Cancer of right colon ([place])    11/25/2018 - 05/05/2019 Chemotherapy    FOLFOX q2weeks starting 11/25/18. Due to moderate thrombocytopenia, I will stop 5-FU bolus, and reduce pump infusion to 2257m/m2 starting with cycle 7.  Plan for last treatment on 05/05/19.     02/10/2019 Imaging    CT AP W Contrast  IMPRESSION: 1. No evidence metastatic disease. 2. Hepatic steatosis. 3. Mildly enlarged prostate. 4. Aortic atherosclerosis (ICD10-170.0). coronary artery calcification.      CURRENT THERAPY:  AdjuvantFOLFOX every 2 weeks starting12/19/19.  Dose reduced with cycle 7 due to Thrombocytopenia. Plan for last treatment on 05/05/19.  INTERVAL HISTORY:  Blake Vaughn is here for a follow up and treatment. 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 He  notes he is doing well. He notes with dose reduction his neuropathy is Improving well. He is taking Gabapentin thus but will take it TID for now.     REVIEW OF SYSTEMS:   Constitutional: Denies fevers, chills or abnormal weight loss Eyes: Denies blurriness of vision Ears, nose, mouth, throat, and face: Denies mucositis or sore throat Respiratory: Denies cough, dyspnea or wheezes Cardiovascular: Denies palpitation, chest discomfort or lower extremity swelling Gastrointestinal:  Denies nausea, heartburn or change in bowel habits Skin: Denies abnormal skin rashes Lymphatics: Denies new lymphadenopathy or easy bruising Neurological:Denies numbness, tingling or new weaknesses Behavioral/Psych: Mood is stable, no new changes  All other systems were reviewed with the patient and are negative.  MEDICAL HISTORY:  Past Medical History:  Diagnosis Date   Appendicitis 10/21/2018   Diabetes mellitus    Follicular lymphoma ([place]), 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   Qualifier: Diagnosis of  By: [name], [name]    Hypercholesterolemia    Hypertension 11/27/2011   Lymphoma [facility])    gets annual chemo last tx Feb 2013   TOBACCO USE, QUIT 08/18/2009   Qualifier: Diagnosis of  By: Drue Flirt  MD, Merrily Brittle      SURGICAL HISTORY: Past Surgical History:  Procedure Laterality Date   ESOPHAGOGASTRODUODENOSCOPY (EGD) WITH PROPOFOL N/A 11/05/2018   Procedure: ESOPHAGOGASTRODUODENOSCOPY (EGD) WITH PROPOFOL;  Surgeon: [name], MD;  Location: [facility] ENDOSCOPY;  Service: Endoscopy;  Laterality: N/A;   IR IMAGING GUIDED PORT INSERTION  11/24/2018   LAPAROSCOPIC APPENDECTOMY N/A 10/21/2018   Procedure: Exploratory laparotomy right hemicolectomy;  Surgeon: [name], MD;  Location: [facility];  Service: General;  Laterality: N/A;    I have reviewed the social history and family history with the patient and they are unchanged from previous note.  ALLERGIES:  is allergic to iohexol.  MEDICATIONS:    Current Outpatient Medications  Medication Sig Dispense Refill   gabapentin (NEURONTIN) 100 MG capsule Take 1 capsule (100 mg total) by mouth 3 (three) times daily. 90 capsule 1   lidocaine-prilocaine (EMLA) cream Apply to affected area once 30 g 3   metFORMIN (GLUCOPHAGE) 1000 MG tablet TAKE 1 TABLET BY MOUTH TWICE DAILY WITH A MEAL 90 tablet 3   prochlorperazine (COMPAZINE) 10 MG tablet Take 1 tablet (10 mg total) by mouth every 6 (six) hours as needed (Nausea or vomiting). 30 tablet 2   No current facility-administered medications for this visit.    Facility-Administered Medications Ordered in Other Visits  Medication Dose Route Frequency Provider Last Rate Last Dose   fluorouracil (ADRUCIL) 4,350 mg in sodium chloride 0.9 % 63 mL chemo infusion  2,200 mg/m2 (Treatment Plan Recorded) Intravenous 1 day or 1 dose [name], MD   4,350 mg at 05/05/19 1425   heparin lock flush 100 unit/mL  500 Units Intracatheter Once PRN [name], MD       sodium chloride flush (NS) 0.9 % injection 10 mL  10 mL Intracatheter PRN [name], MD        PHYSICAL EXAMINATION: ECOG PERFORMANCE STATUS: 1 - Symptomatic but completely ambulatory  Vitals:   05/05/19 0922  BP: 131/80  Pulse: 75  Resp: 18  Temp: 97.9 F (36.6 C)  SpO2: 100%   Filed Weights   05/05/19 5993  Weight: 182 lb 4.8 oz (82.7 kg)    GENERAL:alert, no distress and comfortable SKIN: skin color, texture, turgor are normal, no rashes or significant lesions EYES: normal, Conjunctiva are pink and non-injected, sclera clear  NECK: supple, thyroid normal size, non-tender, without nodularity LYMPH:  no palpable lymphadenopathy in the cervical, axillary  LUNGS: clear to auscultation and percussion with normal breathing effort HEART: regular rate & rhythm and no murmurs and no lower extremity edema ABDOMEN:abdomen soft, non-tender and normal bowel sounds Musculoskeletal:no cyanosis of digits and no clubbing  NEURO: alert &  oriented x 3 with fluent speech, no focal motor/sensory deficits  LABORATORY DATA:  I have reviewed the data as listed CBC Latest Ref Rng & Units 05/05/2019 04/21/2019 03/31/2019  WBC 4.0 - 10.5 K/uL 8.0 5.6 5.9  Hemoglobin 13.0 - 17.0 g/dL 14.0 14.4 13.3  Hematocrit 39.0 - 52.0 % 42.6 44.1 41.6  Platelets 150 - 400 K/uL 97(L) 111(L) 84(L)     CMP Latest Ref Rng & Units 05/05/2019 04/21/2019 03/31/2019  Glucose 70 - 99 mg/dL 224(H) 217(H) 176(H)  BUN 6 - 20 mg/dL '16 16 19  ' Creatinine 0.61 - 1.24 mg/dL 0.84 0.84 0.77  Sodium 135 - 145 mmol/L 136 137 138  Potassium 3.5 - 5.1 mmol/L 4.5 4.2 4.3  Chloride 98 - 111 mmol/L 106 106 106  CO2 22 - 32 mmol/L '23 22 23  ' Calcium 8.9 - 10.3 mg/dL 9.1 9.2 9.0  Total Protein 6.5 - 8.1 g/dL 6.3(L) 6.5 6.5  Total Bilirubin 0.3 - 1.2 mg/dL 0.5 0.5 0.4  Alkaline Phos 38 - 126 U/L 138(H) 142(H) 150(H)  AST 15 - 41 U/L 31 31 34  ALT 0 - 44 U/L 32 30 27     RADIOGRAPHIC STUDIES: I have personally reviewed the radiological images as listed and agreed with the findings in the report. No results found.   ASSESSMENT & PLAN:  GOLDIE DIMMER is a 58 y.o. male with   1.RightCecal adenocarcinoma,pT4bN1aM1c,Stage IVC,with limited peritoneal metastasis,resected,Grade II-III, MMR normal -Diagnosed in 10/2018. Treated with right hemicolectomy.He was found to have 2 smallperitoneal metastasis during the surgery, which were removed. -He has startedadjuvantFOLFOX q2weeks. Tolerating well. Due to moderate thrombocytopenia, I stopped 5-FU bolus, and reduce pumpinfusion to 2248m/m2startingwith cycle 7.  -He is clinically doing well. With dose reduction his neuropathy is improving.  -Labs reviewed, CBC WNL except PLT 97K, CMP WNL except glucose 224 . Overall adequate to proceed with last cycle FOLFOX today with same dose.  -I discussed the risk of cancer recurrence in the future. I discussed the surveillance plan, which is a physical exam and lab test  (including CBC, CMP and CEA) every 3 months for the first 2 years, then every 6-12 months, colonoscopy in one year, and surveillance CT scan every 6-12 month for up to 5 year. -Plan to rescan in3-4 months, sooner if CEA continue rising.   -I discussed keeping his PAC for 1-2 years given he is still at high risk for recurrence. He will continue flushes. He is agreeable.  -F/u in 4 weeks, will order restaging CT on next visit   2. H/o Follicular Lymphoma -Was treated with CHOP/Rituxan in 2010.Then he waspreviouslytreated withMaintenance Rituxancompleted in 2012.Last f/u was in 2017.  3. DM and Hypercholesterolemia, uncontrolled hyperglycemia -On metformin,Glimepiride,Lipitor and metoprolol -His blood glucose has been increased latelypartiallydue to the premedication steroids -I previouslydecreasedhis pre-chemo dexamethasone from 10 mg to 5 mg, he receives every 2 weeks -Iagainencouraged him to monitor his blood glucose  at home. He notes he does not have glucometer at home. He will ask for one by his PCP.  -I encouraged him to watch and adjust his diet. -Continue to f/u with PCP, he will see PCP next week. I encouraged him to discuss adjusting his medications.  4. Financial Support -Medicaid pending -His wife is in [place]. Has 2 children andabrother that help his at home  5. Insomnia -Ipreviouslyrecommend Melatonin or Benadryl.  6.Thrombocytopenia -secondary tochemotherapy, so I reduced chemo dose -I will monitor. PLT at 97K today (05/05/19).   7. 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  -He will continue Gabapentin 178m TID for his tingling. -His neuropathy is improving with chemo dose reduction. I discussed after chemo this will take time to recover.   Plan  -Labsreviewed, adequate for treatment, will proceedwith last cycle FOLFOXtoday at same dose -lab flush and f/u in 4 weeks, will  order restaging CT on next visit     No problem-specific Assessment & Plan notes found for this encounter.   No orders of the defined types were placed in this encounter.  All questions were answered. The patient knows to call the clinic with any problems, questions or concerns. No barriers to learning was detected. I spent 20 minutes counseling the patient face to face. The total time spent in the appointment was 25 minutes and more than 50% was on counseling and review of test results     YTruitt [name] MD 05/05/2019   I, AJoslyn Devon am acting as scribe for YTruitt [name] MD.   I have reviewed the above documentation for accuracy and completeness, and I agree with the above.

## 2019-05-05 ENCOUNTER — Inpatient Hospital Stay: Payer: Medicaid Other

## 2019-05-05 ENCOUNTER — Other Ambulatory Visit: Payer: Self-pay

## 2019-05-05 ENCOUNTER — Encounter: Payer: Self-pay | Admitting: Hematology

## 2019-05-05 ENCOUNTER — Telehealth: Payer: Self-pay | Admitting: Hematology

## 2019-05-05 ENCOUNTER — Inpatient Hospital Stay (HOSPITAL_BASED_OUTPATIENT_CLINIC_OR_DEPARTMENT_OTHER): Payer: Medicaid Other | Admitting: Hematology

## 2019-05-05 VITALS — BP 131/80 | HR 75 | Temp 97.9°F | Resp 18 | Ht 60.0 in | Wt 182.3 lb

## 2019-05-05 DIAGNOSIS — Z79899 Other long term (current) drug therapy: Secondary | ICD-10-CM

## 2019-05-05 DIAGNOSIS — C182 Malignant neoplasm of ascending colon: Secondary | ICD-10-CM

## 2019-05-05 DIAGNOSIS — R200 Anesthesia of skin: Secondary | ICD-10-CM | POA: Diagnosis not present

## 2019-05-05 DIAGNOSIS — C786 Secondary malignant neoplasm of retroperitoneum and peritoneum: Secondary | ICD-10-CM

## 2019-05-05 DIAGNOSIS — D696 Thrombocytopenia, unspecified: Secondary | ICD-10-CM | POA: Diagnosis not present

## 2019-05-05 DIAGNOSIS — Z8572 Personal history of non-Hodgkin lymphomas: Secondary | ICD-10-CM | POA: Diagnosis not present

## 2019-05-05 DIAGNOSIS — Z5111 Encounter for antineoplastic chemotherapy: Secondary | ICD-10-CM | POA: Diagnosis not present

## 2019-05-05 DIAGNOSIS — R238 Other skin changes: Secondary | ICD-10-CM

## 2019-05-05 DIAGNOSIS — Z95828 Presence of other vascular implants and grafts: Secondary | ICD-10-CM

## 2019-05-05 LAB — CMP (CANCER CENTER ONLY)
ALT: 32 U/L (ref 0–44)
AST: 31 U/L (ref 15–41)
Albumin: 3.2 g/dL — ABNORMAL LOW (ref 3.5–5.0)
Alkaline Phosphatase: 138 U/L — ABNORMAL HIGH (ref 38–126)
Anion gap: 7 (ref 5–15)
BUN: 16 mg/dL (ref 6–20)
CO2: 23 mmol/L (ref 22–32)
Calcium: 9.1 mg/dL (ref 8.9–10.3)
Chloride: 106 mmol/L (ref 98–111)
Creatinine: 0.84 mg/dL (ref 0.61–1.24)
GFR, Est AFR Am: >60 mL/min (ref >60)
GFR, Estimated: >60 mL/min (ref >60)
Glucose, Bld: 224 mg/dL — ABNORMAL HIGH (ref 70–99)
Potassium: 4.5 mmol/L (ref 3.5–5.1)
Sodium: 136 mmol/L (ref 135–145)
Total Bilirubin: 0.5 mg/dL (ref 0.3–1.2)
Total Protein: 6.3 g/dL — ABNORMAL LOW (ref 6.5–8.1)

## 2019-05-05 LAB — CBC WITH DIFFERENTIAL (CANCER CENTER ONLY)
Abs Immature Granulocytes: 0.01 10*3/uL (ref 0.00–0.07)
Basophils Absolute: 0 10*3/uL (ref 0.0–0.1)
Basophils Relative: 0 %
Eosinophils Absolute: 0.4 10*3/uL (ref 0.0–0.5)
Eosinophils Relative: 5 %
HCT: 42.6 % (ref 39.0–52.0)
Hemoglobin: 14 g/dL (ref 13.0–17.0)
Immature Granulocytes: 0 %
Lymphocytes Relative: 28 %
Lymphs Abs: 2.2 10*3/uL (ref 0.7–4.0)
MCH: 29 pg (ref 26.0–34.0)
MCHC: 32.9 g/dL (ref 30.0–36.0)
MCV: 88.2 fL (ref 80.0–100.0)
Monocytes Absolute: 0.9 10*3/uL (ref 0.1–1.0)
Monocytes Relative: 12 %
Neutro Abs: 4.4 10*3/uL (ref 1.7–7.7)
Neutrophils Relative %: 55 %
Platelet Count: 97 10*3/uL — ABNORMAL LOW (ref 150–400)
RBC: 4.83 MIL/uL (ref 4.22–5.81)
RDW: 16 % — ABNORMAL HIGH (ref 11.5–15.5)
WBC Count: 8 10*3/uL (ref 4.0–10.5)
nRBC: 0 % (ref 0.0–0.2)

## 2019-05-05 MED ORDER — DEXAMETHASONE SODIUM PHOSPHATE 10 MG/ML IJ SOLN
5.0000 mg | Freq: Once | INTRAMUSCULAR | Status: AC
  Administered 2019-05-05: 5 mg via INTRAVENOUS

## 2019-05-05 MED ORDER — DEXTROSE 5 % IV SOLN
Freq: Once | INTRAVENOUS | Status: AC
  Administered 2019-05-05: 11:00:00 via INTRAVENOUS
  Filled 2019-05-05: qty 250

## 2019-05-05 MED ORDER — SODIUM CHLORIDE 0.9 % IV SOLN
2200.0000 mg/m2 | INTRAVENOUS | Status: DC
  Administered 2019-05-05: 14:00:00 4350 mg via INTRAVENOUS
  Filled 2019-05-05: qty 87

## 2019-05-05 MED ORDER — SODIUM CHLORIDE 0.9% FLUSH
10.0000 mL | INTRAVENOUS | Status: DC | PRN
  Administered 2019-05-05: 10 mL
  Filled 2019-05-05: qty 10

## 2019-05-05 MED ORDER — PALONOSETRON HCL INJECTION 0.25 MG/5ML
INTRAVENOUS | Status: AC
  Filled 2019-05-05: qty 5

## 2019-05-05 MED ORDER — OXALIPLATIN CHEMO INJECTION 100 MG/20ML
60.0000 mg/m2 | Freq: Once | INTRAVENOUS | Status: AC
  Administered 2019-05-05: 12:00:00 120 mg via INTRAVENOUS
  Filled 2019-05-05: qty 20

## 2019-05-05 MED ORDER — DEXTROSE 5 % IV SOLN
Freq: Once | INTRAVENOUS | Status: AC
  Administered 2019-05-05: 10:00:00 via INTRAVENOUS
  Filled 2019-05-05: qty 250

## 2019-05-05 MED ORDER — PALONOSETRON HCL INJECTION 0.25 MG/5ML
0.2500 mg | Freq: Once | INTRAVENOUS | Status: AC
  Administered 2019-05-05: 0.25 mg via INTRAVENOUS

## 2019-05-05 MED ORDER — DEXAMETHASONE SODIUM PHOSPHATE 10 MG/ML IJ SOLN
INTRAMUSCULAR | Status: AC
  Filled 2019-05-05: qty 1

## 2019-05-05 MED ORDER — SODIUM CHLORIDE 0.9% FLUSH
10.0000 mL | INTRAVENOUS | Status: DC | PRN
  Filled 2019-05-05: qty 10

## 2019-05-05 MED ORDER — HEPARIN SOD (PORK) LOCK FLUSH 100 UNIT/ML IV SOLN
500.0000 [IU] | Freq: Once | INTRAVENOUS | Status: DC | PRN
  Filled 2019-05-05: qty 5

## 2019-05-05 MED ORDER — LEUCOVORIN CALCIUM INJECTION 350 MG
400.0000 mg/m2 | Freq: Once | INTRAVENOUS | Status: AC
  Administered 2019-05-05: 788 mg via INTRAVENOUS
  Filled 2019-05-05: qty 39.4

## 2019-05-05 NOTE — Patient Instructions (Signed)
[facility] Discharge Instructions for Patients Receiving Chemotherapy  Today you received the following chemotherapy agents: Oxaliplatiin, Leucovorin, Fluorouracil  To help prevent nausea and vomiting after your treatment, we encourage you to take your nausea medication as directed by your MD.   If you develop nausea and vomiting that is not controlled by your nausea medication, call the clinic.   BELOW ARE SYMPTOMS THAT SHOULD BE REPORTED IMMEDIATELY:  *FEVER GREATER THAN 100.5 F  *CHILLS WITH OR WITHOUT FEVER  NAUSEA AND VOMITING THAT IS NOT CONTROLLED WITH YOUR NAUSEA MEDICATION  *UNUSUAL SHORTNESS OF BREATH  *UNUSUAL BRUISING OR BLEEDING  TENDERNESS IN MOUTH AND THROAT WITH OR WITHOUT PRESENCE OF ULCERS  *URINARY PROBLEMS  *BOWEL PROBLEMS  UNUSUAL RASH Items with * indicate a potential emergency and should be followed up as soon as possible.  Feel free to call the clinic should you have any questions or concerns. The clinic phone number is [phone].  Please show the [facility] at check-in to the Emergency Department and triage nurse.  Coronavirus (COVID-19) Are you at risk?  Are you at risk for the Coronavirus (COVID-19)?  To be considered HIGH RISK for Coronavirus (COVID-19), you have to meet the following criteria:  . Traveled to [place], [place], [place], [place] or [place]; or in the [place] to [place], [place], [place], or [place]; and have fever, cough, and shortness of breath within the last 2 weeks of travel OR . Been in close contact with a person diagnosed with COVID-19 within the last 2 weeks and have fever, cough, and shortness of breath . IF YOU DO NOT MEET THESE CRITERIA, YOU ARE CONSIDERED LOW RISK FOR COVID-19.  What to do if you are HIGH RISK for COVID-19?  Marland Kitchen If you are having a medical emergency, call 911. . Seek medical care right away. Before you go to a doctor's office, urgent care or emergency department,  call ahead and tell them about your recent travel, contact with someone diagnosed with COVID-19, and your symptoms. You should receive instructions from your physician's office regarding next steps of care.  . When you arrive at healthcare provider, tell the healthcare staff immediately you have returned from visiting [place], [place], [place], [place] or [place]; or traveled in the [place] to [place], [place], [place], or [place]; in the last two weeks or you have been in close contact with a person diagnosed with COVID-19 in the last 2 weeks.   . Tell the health care staff about your symptoms: fever, cough and shortness of breath. . After you have been seen by a medical provider, you will be either: o Tested for (COVID-19) and discharged home on quarantine except to seek medical care if symptoms worsen, and asked to  - Stay home and avoid contact with others until you get your results (4-5 days)  - Avoid travel on public transportation if possible (such as bus, train, or airplane) or o Sent to the Emergency Department by EMS for evaluation, COVID-19 testing, and possible admission depending on your condition and test results.  What to do if you are LOW RISK for COVID-19?  Reduce your risk of any infection by using the same precautions used for avoiding the common cold or flu:  Marland Kitchen Wash your hands often with soap and warm water for at least 20 seconds.  If soap and water are not readily available, use an alcohol-based hand sanitizer with at least 60% alcohol.  Marland Kitchen  If coughing or sneezing, cover your mouth and nose by coughing or sneezing into the elbow areas of your shirt or coat, into a tissue or into your sleeve (not your hands). . Avoid shaking hands with others and consider head nods or verbal greetings only. . Avoid touching your eyes, nose, or mouth with unwashed hands.  . Avoid close contact with people who are sick. . Avoid places or events with large numbers of people in one  location, like concerts or sporting events. . Carefully consider travel plans you have or are making. . If you are planning any travel outside or inside the [place], visit the CDC's Travelers' Health webpage for the latest health notices. . If you have some symptoms but not all symptoms, continue to monitor at home and seek medical attention if your symptoms worsen. . If you are having a medical emergency, call 911.   [place] / e-Visit: [email or web address]         [facility]: [facility]: [phone]                   [facility]: [phone]

## 2019-05-05 NOTE — Progress Notes (Signed)
Per Dr. Burr Medico, [facility] to treat with chemotherapy today with platelets 97,000.

## 2019-05-05 NOTE — Telephone Encounter (Signed)
No los per 5/28 los.

## 2019-05-07 ENCOUNTER — Other Ambulatory Visit: Payer: Self-pay

## 2019-05-07 ENCOUNTER — Inpatient Hospital Stay: Payer: Medicaid Other

## 2019-05-07 VITALS — BP 124/69 | HR 80 | Temp 98.7°F | Resp 18

## 2019-05-07 DIAGNOSIS — C182 Malignant neoplasm of ascending colon: Secondary | ICD-10-CM

## 2019-05-07 DIAGNOSIS — Z5111 Encounter for antineoplastic chemotherapy: Secondary | ICD-10-CM | POA: Diagnosis not present

## 2019-05-07 MED ORDER — SODIUM CHLORIDE 0.9% FLUSH
10.0000 mL | INTRAVENOUS | Status: DC | PRN
  Administered 2019-05-07: 10 mL
  Filled 2019-05-07: qty 10

## 2019-05-07 MED ORDER — HEPARIN SOD (PORK) LOCK FLUSH 100 UNIT/ML IV SOLN
500.0000 [IU] | Freq: Once | INTRAVENOUS | Status: AC | PRN
  Administered 2019-05-07: 500 [IU]
  Filled 2019-05-07: qty 5

## 2019-05-12 ENCOUNTER — Ambulatory Visit: Payer: Self-pay | Admitting: Hematology

## 2019-05-12 ENCOUNTER — Other Ambulatory Visit: Payer: Self-pay

## 2019-05-12 ENCOUNTER — Ambulatory Visit: Payer: Self-pay

## 2019-05-19 ENCOUNTER — Ambulatory Visit: Payer: Self-pay | Admitting: Hematology

## 2019-05-19 ENCOUNTER — Ambulatory Visit: Payer: Self-pay

## 2019-05-19 ENCOUNTER — Other Ambulatory Visit: Payer: Self-pay

## 2019-05-23 ENCOUNTER — Other Ambulatory Visit: Payer: Self-pay | Admitting: Family Medicine

## 2019-05-25 ENCOUNTER — Ambulatory Visit: Payer: Medicaid Other | Admitting: Family Medicine

## 2019-05-27 NOTE — Progress Notes (Signed)
[name]   [phone] [phone]   Clinic Follow up Note   Patient Care Team: [name], [name], DO as PCP - General [name], MD as Consulting Physician (Colon and Rectal Surgery) [name], MD as Consulting Physician (Hematology)  Date of Service:  06/02/2019  CHIEF COMPLAINT: F/u on colon cancer  SUMMARY OF ONCOLOGIC HISTORY: Oncology History Overview Note  Cancer Staging Cancer of right colon [facility]) Staging form: Colon and Rectum, AJCC 8th Edition - Pathologic stage from 10/21/2018: Stage IVC (pT4b, pN1a, pM1c) - Signed by [name], MD on 07/14/5783  Follicular lymphoma ([place]), History of Staging form: Lymphoid Neoplasms, AJCC 6th Edition - Clinical: Stage II - Signed by Curt Bears, MD on 6/96/2952     Follicular lymphoma [facility]), History of  11/2009 Initial Diagnosis   Follicular lymphoma ([place]), History of    Chemotherapy   1) Status post 6 cycles of systemic chemotherapy with CHOP/Rituxan last dose given 05/01/2009.  2) Maintenance Rituxan at 375 mg per meter square given every 2 months status post 12 cycles    Cancer of right colon ([place])  10/21/2018 Surgery   Exploratory laparotomy right hemicolectomy by Dr. [name] and Dr. [name]  10/21/18   10/21/2018 Pathology Results   Diagnosis 10/21/18 Colon, segmental resection for tumor, right ascending and appendix - ADENOCARCINOMA, MODERATE TO POORLY DIFFERENTIATED (4 CM) - METASTATIC CARCINOMA INVOLVING ONE OF EIGHTEEN LYMPH NODES (1/18) - CARCINOMA EXTENDS INTO THE APPENDIX - TWO TUMOR DEPOSITS PRESENT - SEE ONCOLOGY TABLE AND COMMENT BELOW   10/21/2018 Cancer Staging   Staging form: Colon and Rectum, AJCC 8th Edition - Pathologic stage from 10/21/2018: Stage IVC (pT4b, pN1a, pM1c) - Signed by [name], MD on 11/13/2018   11/04/2018 Imaging   CT AP W Contrast 11/04/18  IMPRESSION: 1. Interval appendectomy. There is residual soft tissue thickening and  stranding in the right lower quadrant adjacent to the cecum which may represent ongoing inflammation versus postsurgical changes. A small soft tissue density adjacent to the surgical sutures may reflect small hematoma or operative collection. No large focal fluid collection to suggest drainable abscess allowing for absence of contrast 2. Fluid-filled colon without wall thickening, could reflect diarrheal process   11/08/2018 Imaging   CT Chest W Contrast 11/08/18  IMPRESSION: 1. No acute findings are noted in the thorax to account for the patient's symptoms. 2. Aortic atherosclerosis, in addition to left main and 3 vessel coronary artery disease. Please note that although the presence of coronary artery calcium documents the presence of coronary artery disease, the severity of this disease and any potential stenosis cannot be assessed on this non-gated CT examination. Assessment for potential risk factor modification, dietary therapy or pharmacologic therapy may be warranted, if clinically indicated. 3. Additional incidental findings, as above. Aortic Atherosclerosis (ICD10-I70.0).   11/12/2018 Initial Diagnosis   Cancer of right colon ([place])   11/25/2018 - 05/05/2019 Chemotherapy   FOLFOX q2weeks starting 11/25/18. Due to moderate thrombocytopenia, I will stop 5-FU bolus, and reduce pump infusion to 2253m/m2 starting with cycle 7.  Plan for last treatment on 05/05/19.    02/10/2019 Imaging   CT AP W Contrast  IMPRESSION: 1. No evidence metastatic disease. 2. Hepatic steatosis. 3. Mildly enlarged prostate. 4. Aortic atherosclerosis (ICD10-170.0). coronary artery calcification.      CURRENT THERAPY:  Surveillance   INTERVAL HISTORY: Blake THIELMANis here for a follow up of colon cancer. He presents with his Spanish [employer] He notes he is  recovering from chemo well. He still has b/l neuropathy of hands which started 2 weeks ago and getting mildly worse. It is  hard to button shirt. He denies neuropathy in his feet. He is taking Gabapentin 120m TID. This is not painful at night or effect his sleep. He notes he will take benadryl at night as needed to help him sleep. He notes his BMs are normal.   REVIEW OF SYSTEMS:   Constitutional: Denies fevers, chills or abnormal weight loss Eyes: Denies blurriness of vision Ears, nose, mouth, throat, and face: Denies mucositis or sore throat Respiratory: Denies cough, dyspnea or wheezes Cardiovascular: Denies palpitation, chest discomfort or lower extremity swelling Gastrointestinal:  Denies nausea, heartburn or change in bowel habits Skin: Denies abnormal skin rashes Lymphatics: Denies new lymphadenopathy or easy bruising Neurological: (+) Neuropathy of b/l hands worsening  Behavioral/Psych: Mood is stable, no new changes  All other systems were reviewed with the patient and are negative.  MEDICAL HISTORY:  Past Medical History:  Diagnosis Date  . Appendicitis 10/21/2018  . Diabetes mellitus   . Follicular lymphoma ([place], 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   Qualifier: Diagnosis of  By: KDeatra InaMD, RSandy Salaam  . Hypercholesterolemia   . Hypertension 11/27/2011  . Lymphoma ([facility]    gets annual chemo last tx Feb 2013  . TOBACCO USE, QUIT 08/18/2009   Qualifier: Diagnosis of  By: BDrue Flirt MD, TMerrily Brittle     SURGICAL HISTORY: Past Surgical History:  Procedure Laterality Date  . ESOPHAGOGASTRODUODENOSCOPY (EGD) WITH PROPOFOL N/A 11/05/2018   Procedure: ESOPHAGOGASTRODUODENOSCOPY (EGD) WITH PROPOFOL;  Surgeon: BRonald Lobo MD;  [facility]  Service: Endoscopy;  Laterality: N/A;  . IR IMAGING GUIDED PORT INSERTION  11/24/2018  . LAPAROSCOPIC APPENDECTOMY N/A 10/21/2018   Procedure: Exploratory laparotomy right hemicolectomy;  Surgeon: WIleana Roup MD;  [facility]  Service: General;  Laterality: N/A;    I have reviewed the social history and family history with the patient and they are  unchanged from previous note.  ALLERGIES:  is allergic to iohexol.  MEDICATIONS:  Current Outpatient Medications  Medication Sig Dispense Refill  . gabapentin (NEURONTIN) 100 MG capsule Take 1 capsule (100 mg total) by mouth 3 (three) times daily. 90 capsule 1  . lidocaine-prilocaine (EMLA) cream Apply to affected area once 30 g 3  . metFORMIN (GLUCOPHAGE) 1000 MG tablet TAKE 1 TABLET BY MOUTH TWICE DAILY WITH A MEAL 90 tablet 3  . prochlorperazine (COMPAZINE) 10 MG tablet Take 1 tablet (10 mg total) by mouth every 6 (six) hours as needed (Nausea or vomiting). 30 tablet 2   No current facility-administered medications for this visit.     PHYSICAL EXAMINATION: ECOG PERFORMANCE STATUS: 1 - Symptomatic but completely ambulatory  Vitals:   06/02/19 1121  BP: (!) 157/82  Pulse: 83  Resp: 17  Temp: 98.7 F (37.1 C)  SpO2: 98%   Filed Weights   06/02/19 1121  Weight: 184 lb 6.4 oz (83.6 kg)    GENERAL:alert, no distress and comfortable SKIN: skin color, texture, turgor are normal, no rashes or significant lesions EYES: normal, Conjunctiva are pink and non-injected, sclera clear  NECK: supple, thyroid normal size, non-tender, without nodularity LYMPH:  no palpable lymphadenopathy in the cervical, axillary  LUNGS: clear to auscultation and percussion with normal breathing effort HEART: regular rate & rhythm and no murmurs and no lower extremity edema ABDOMEN:abdomen soft, non-tender and normal bowel sounds Musculoskeletal:no cyanosis of digits and no clubbing  NEURO: alert &  oriented x 3 with fluent speech, no focal motor/sensory deficits  LABORATORY DATA:  I have reviewed the data as listed CBC Latest Ref Rng & Units 06/02/2019 05/05/2019 04/21/2019  WBC 4.0 - 10.5 K/uL 7.5 8.0 5.6  Hemoglobin 13.0 - 17.0 g/dL 14.8 14.0 14.4  Hematocrit 39.0 - 52.0 % 44.5 42.6 44.1  Platelets 150 - 400 K/uL 100(L) 97(L) 111(L)     CMP Latest Ref Rng & Units 06/02/2019 05/05/2019 04/21/2019   Glucose 70 - 99 mg/dL 352(H) 224(H) 217(H)  BUN 6 - 20 mg/dL '17 16 16  ' Creatinine 0.61 - 1.24 mg/dL 1.03 0.84 0.84  Sodium 135 - 145 mmol/L 134(L) 136 137  Potassium 3.5 - 5.1 mmol/L 4.3 4.5 4.2  Chloride 98 - 111 mmol/L 103 106 106  CO2 22 - 32 mmol/L '23 23 22  ' Calcium 8.9 - 10.3 mg/dL 8.7(L) 9.1 9.2  Total Protein 6.5 - 8.1 g/dL 6.5 6.3(L) 6.5  Total Bilirubin 0.3 - 1.2 mg/dL 0.4 0.5 0.5  Alkaline Phos 38 - 126 U/L 168(H) 138(H) 142(H)  AST 15 - 41 U/L '26 31 31  ' ALT 0 - 44 U/L 26 32 30      RADIOGRAPHIC STUDIES: I have personally reviewed the radiological images as listed and agreed with the findings in the report. No results found.   ASSESSMENT & PLAN:  Blake Vaughn is a 58 y.o. male with    1.RightCecal adenocarcinoma,pT4bN1aM1c,Stage IVC,with limited peritoneal metastasis,resected,Grade II-III, MMR normal -Diagnosed in 10/2018. Treated with right hemicolectomy.He was found to have 2 smallperitoneal metastasis during the surgery, which were removed. -He completed 12 cycles of adjuvant FOLFOX.  -I previously discussed the risk of cancer recurrence in the future. I discussed the surveillance plan, which is a physical exam and lab test (including CBC, CMP and CEA) every 3 months for the first 2 years, then every 6-12 months, colonoscopy in one year, andsurveillanceCT scan every 6-12 month for up to 5 year. -Given high risk of recurrence, I recommend he keep his PAC in place. Will continue port flush every 6-8 weeks -He is recovering from chemo well. He has progressed neuropathy in his hands but resolved in feet. I discussed this will take time to recover.  -Labs reviewed, CBC and CMP WNL except PLT 100K, BG 352, Ca 8.7, Albumin 3.3., Alk Phos 168. Iron normal, ferritin and CEA still pending.  -Will proceed with CT CAP before next visit. He will get prednisone and benydral for CT contrast allergy.  -F/u in 3 months   2. H/o Follicular Lymphoma -Was treated  with CHOP/Rituxan in 2010.Then he waspreviouslytreated withMaintenance Rituxancompleted in 2012.Last f/u was in 2017.  3. DM and Hypercholesterolemia, uncontrolled hyperglycemia -On metformin,Glimepiride,Lipitor and metoprolol -Iagainencouraged him to monitor his blood glucose at home. He notes he does not have glucometer at home. He will ask for one by his PCP.  -Continue to f/u with PCP, he will see PCP next week. I encouraged him to discuss adjusting his medications. -BG at 352 today (06/02/19). I strongly encouraged him to better control his DM.   4. Financial Support -Medicaid pending -His wife is in [place]. Has 2 children andabrother that help his at home  5. Insomnia -He is currently taking Benadryl as needed which helps  6.Thrombocytopenia -secondary tochemotherapy, so I reduced chemo dose -I will monitor. PLT at100Ktoday (06/02/19).   7. 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 Had recent worsening of b.l hands 2 weeks ago.  -He will continue Gabapentin 177m  TID for his tingling. I encouraged him to increase to 263m at night and he can titrate up to 3078mat night if needed.  -I also encouraged him to start OTC B complex  -I discussed after chemo this will take time to recover.   Plan -I encouraged him to increase Gabapentin at night and to start Vitamin B complex  -port flush in 6 weeks -F/u in 3 months with lab, flush and CT CAP a few days before, he will take prednisone and Benadryl for CT contrast allergy, and hold metformin before CT scan     No problem-specific Assessment & Plan notes found for this encounter.   Orders Placed This Encounter  Procedures  . CT Abdomen Pelvis W Contrast    Standing Status:   Future    Standing Expiration Date:   06/01/2020    Order Specific Question:   If indicated for the ordered procedure, I authorize the administration of contrast media per Radiology protocol    Answer:   Yes     Order Specific Question:   [facility]    [facility]  Order Specific Question:   Is Oral Contrast requested for this exam?    Answer:   Yes, Per Radiology protocol    Order Specific Question:   Radiology Contrast Protocol - do NOT remove file path    Answer:   [email or web address]   All questions were answered. The patient knows to call the clinic with any problems, questions or concerns. No barriers to learning was detected. I spent 20 minutes counseling the patient face to face. The total time spent in the appointment was 25 minutes and more than 50% was on counseling and review of test results     YaTruitt MerleMD 06/02/2019   I, AmJoslyn Devonam acting as scribe for YaTruitt MerleMD.   I have reviewed the above documentation for accuracy and completeness, and I agree with the above.

## 2019-06-02 ENCOUNTER — Inpatient Hospital Stay: Payer: Medicaid Other | Attending: Hematology

## 2019-06-02 ENCOUNTER — Other Ambulatory Visit: Payer: Self-pay | Admitting: *Deleted

## 2019-06-02 ENCOUNTER — Other Ambulatory Visit: Payer: Self-pay

## 2019-06-02 ENCOUNTER — Telehealth: Payer: Self-pay | Admitting: Hematology

## 2019-06-02 ENCOUNTER — Encounter: Payer: Self-pay | Admitting: Hematology

## 2019-06-02 ENCOUNTER — Inpatient Hospital Stay (HOSPITAL_BASED_OUTPATIENT_CLINIC_OR_DEPARTMENT_OTHER): Payer: Medicaid Other | Admitting: Hematology

## 2019-06-02 ENCOUNTER — Inpatient Hospital Stay: Payer: Medicaid Other

## 2019-06-02 VITALS — BP 157/82 | HR 83 | Temp 98.7°F | Resp 17 | Ht 60.0 in | Wt 184.4 lb

## 2019-06-02 DIAGNOSIS — E1165 Type 2 diabetes mellitus with hyperglycemia: Secondary | ICD-10-CM | POA: Diagnosis not present

## 2019-06-02 DIAGNOSIS — E7800 Pure hypercholesterolemia, unspecified: Secondary | ICD-10-CM | POA: Insufficient documentation

## 2019-06-02 DIAGNOSIS — Z8572 Personal history of non-Hodgkin lymphomas: Secondary | ICD-10-CM | POA: Diagnosis not present

## 2019-06-02 DIAGNOSIS — Z87891 Personal history of nicotine dependence: Secondary | ICD-10-CM | POA: Insufficient documentation

## 2019-06-02 DIAGNOSIS — G620 Drug-induced polyneuropathy: Secondary | ICD-10-CM

## 2019-06-02 DIAGNOSIS — Z7984 Long term (current) use of oral hypoglycemic drugs: Secondary | ICD-10-CM

## 2019-06-02 DIAGNOSIS — E1140 Type 2 diabetes mellitus with diabetic neuropathy, unspecified: Secondary | ICD-10-CM | POA: Insufficient documentation

## 2019-06-02 DIAGNOSIS — Z79899 Other long term (current) drug therapy: Secondary | ICD-10-CM | POA: Insufficient documentation

## 2019-06-02 DIAGNOSIS — Z9049 Acquired absence of other specified parts of digestive tract: Secondary | ICD-10-CM | POA: Diagnosis not present

## 2019-06-02 DIAGNOSIS — Z9221 Personal history of antineoplastic chemotherapy: Secondary | ICD-10-CM

## 2019-06-02 DIAGNOSIS — C182 Malignant neoplasm of ascending colon: Secondary | ICD-10-CM | POA: Diagnosis not present

## 2019-06-02 DIAGNOSIS — C786 Secondary malignant neoplasm of retroperitoneum and peritoneum: Secondary | ICD-10-CM | POA: Insufficient documentation

## 2019-06-02 DIAGNOSIS — I10 Essential (primary) hypertension: Secondary | ICD-10-CM | POA: Insufficient documentation

## 2019-06-02 DIAGNOSIS — D500 Iron deficiency anemia secondary to blood loss (chronic): Secondary | ICD-10-CM

## 2019-06-02 DIAGNOSIS — Z794 Long term (current) use of insulin: Secondary | ICD-10-CM | POA: Insufficient documentation

## 2019-06-02 DIAGNOSIS — G4700 Insomnia, unspecified: Secondary | ICD-10-CM | POA: Diagnosis not present

## 2019-06-02 DIAGNOSIS — D6959 Other secondary thrombocytopenia: Secondary | ICD-10-CM | POA: Diagnosis not present

## 2019-06-02 DIAGNOSIS — Z95828 Presence of other vascular implants and grafts: Secondary | ICD-10-CM

## 2019-06-02 LAB — CBC WITH DIFFERENTIAL (CANCER CENTER ONLY)
Abs Immature Granulocytes: 0.02 10*3/uL (ref 0.00–0.07)
Basophils Absolute: 0 10*3/uL (ref 0.0–0.1)
Basophils Relative: 0 %
Eosinophils Absolute: 0.8 10*3/uL — ABNORMAL HIGH (ref 0.0–0.5)
Eosinophils Relative: 11 %
HCT: 44.5 % (ref 39.0–52.0)
Hemoglobin: 14.8 g/dL (ref 13.0–17.0)
Immature Granulocytes: 0 %
Lymphocytes Relative: 30 %
Lymphs Abs: 2.2 10*3/uL (ref 0.7–4.0)
MCH: 29.1 pg (ref 26.0–34.0)
MCHC: 33.3 g/dL (ref 30.0–36.0)
MCV: 87.4 fL (ref 80.0–100.0)
Monocytes Absolute: 0.8 10*3/uL (ref 0.1–1.0)
Monocytes Relative: 10 %
Neutro Abs: 3.7 10*3/uL (ref 1.7–7.7)
Neutrophils Relative %: 49 %
Platelet Count: 100 10*3/uL — ABNORMAL LOW (ref 150–400)
RBC: 5.09 MIL/uL (ref 4.22–5.81)
RDW: 15.2 % (ref 11.5–15.5)
WBC Count: 7.5 10*3/uL (ref 4.0–10.5)
nRBC: 0 % (ref 0.0–0.2)

## 2019-06-02 LAB — CMP (CANCER CENTER ONLY)
ALT: 26 U/L (ref 0–44)
AST: 26 U/L (ref 15–41)
Albumin: 3.3 g/dL — ABNORMAL LOW (ref 3.5–5.0)
Alkaline Phosphatase: 168 U/L — ABNORMAL HIGH (ref 38–126)
Anion gap: 8 (ref 5–15)
BUN: 17 mg/dL (ref 6–20)
CO2: 23 mmol/L (ref 22–32)
Calcium: 8.7 mg/dL — ABNORMAL LOW (ref 8.9–10.3)
Chloride: 103 mmol/L (ref 98–111)
Creatinine: 1.03 mg/dL (ref 0.61–1.24)
GFR, Est AFR Am: >60 mL/min (ref >60)
GFR, Estimated: >60 mL/min (ref >60)
Glucose, Bld: 352 mg/dL — ABNORMAL HIGH (ref 70–99)
Potassium: 4.3 mmol/L (ref 3.5–5.1)
Sodium: 134 mmol/L — ABNORMAL LOW (ref 135–145)
Total Bilirubin: 0.4 mg/dL (ref 0.3–1.2)
Total Protein: 6.5 g/dL (ref 6.5–8.1)

## 2019-06-02 LAB — IRON AND TIBC
Iron: 109 ug/dL (ref 42–163)
Saturation Ratios: 24 % (ref 20–55)
TIBC: 451 ug/dL — ABNORMAL HIGH (ref 202–409)
UIBC: 342 ug/dL (ref 117–376)

## 2019-06-02 LAB — CEA (IN HOUSE-CHCC): CEA (CHCC-In House): 7.38 ng/mL — ABNORMAL HIGH (ref 0.00–5.00)

## 2019-06-02 LAB — FERRITIN: Ferritin: 35 ng/mL (ref 24–336)

## 2019-06-02 MED ORDER — DIPHENHYDRAMINE HCL 50 MG PO TABS: 50.0000 mg | ORAL_TABLET | ORAL | 0 refills | Status: DC

## 2019-06-02 MED ORDER — SODIUM CHLORIDE 0.9% FLUSH
10.0000 mL | INTRAVENOUS | Status: DC | PRN
  Administered 2019-06-02: 10 mL
  Filled 2019-06-02: qty 10

## 2019-06-02 MED ORDER — HEPARIN SOD (PORK) LOCK FLUSH 100 UNIT/ML IV SOLN
500.0000 [IU] | Freq: Once | INTRAVENOUS | Status: DC | PRN
  Filled 2019-06-02: qty 5

## 2019-06-02 MED ORDER — PREDNISONE 50 MG PO TABS: ORAL_TABLET | ORAL | 0 refills | Status: DC

## 2019-06-02 MED FILL — predniSONE 50 MG TABS: 50 | 1 days supply | Qty: 3 | Fill #0

## 2019-06-02 NOTE — Telephone Encounter (Signed)
Scheduled appt per 6/25 los. Printed calendar and avs. °

## 2019-06-16 ENCOUNTER — Other Ambulatory Visit: Payer: Self-pay

## 2019-06-16 DIAGNOSIS — C182 Malignant neoplasm of ascending colon: Secondary | ICD-10-CM

## 2019-06-16 MED ORDER — GABAPENTIN 100 MG PO CAPS: 100.0000 mg | ORAL_CAPSULE | Freq: Three times a day (TID) | ORAL | 1 refills | Status: DC

## 2019-06-16 MED FILL — GABAPENTIN 100 MG CAPSULE: 100 | 30 days supply | Qty: 90 | Fill #0

## 2019-07-13 ENCOUNTER — Telehealth: Payer: Self-pay | Admitting: *Deleted

## 2019-07-13 NOTE — Telephone Encounter (Signed)
Needs a refill on glipizide (not on active med list) and metformin.  Has an appt on 07/25/19 and is out of meds  Christen Bame, CMA

## 2019-07-14 ENCOUNTER — Other Ambulatory Visit: Payer: Self-pay

## 2019-07-14 ENCOUNTER — Other Ambulatory Visit: Payer: Self-pay | Admitting: Family Medicine

## 2019-07-14 ENCOUNTER — Inpatient Hospital Stay: Payer: Medicaid Other | Attending: Hematology

## 2019-07-14 DIAGNOSIS — Z95828 Presence of other vascular implants and grafts: Secondary | ICD-10-CM

## 2019-07-14 DIAGNOSIS — Z452 Encounter for adjustment and management of vascular access device: Secondary | ICD-10-CM | POA: Diagnosis not present

## 2019-07-14 DIAGNOSIS — C182 Malignant neoplasm of ascending colon: Secondary | ICD-10-CM | POA: Diagnosis not present

## 2019-07-14 MED ORDER — METFORMIN HCL 1000 MG PO TABS: 1000.0000 mg | ORAL_TABLET | Freq: Two times a day (BID) | ORAL | 0 refills | Status: DC

## 2019-07-14 MED ORDER — SODIUM CHLORIDE 0.9% FLUSH
10.0000 mL | INTRAVENOUS | Status: DC | PRN
  Administered 2019-07-14: 08:00:00 10 mL
  Filled 2019-07-14: qty 10

## 2019-07-14 MED ORDER — HEPARIN SOD (PORK) LOCK FLUSH 100 UNIT/ML IV SOLN
500.0000 [IU] | Freq: Once | INTRAVENOUS | Status: AC | PRN
  Administered 2019-07-14: 08:00:00 500 [IU]
  Filled 2019-07-14: qty 5

## 2019-07-14 NOTE — Telephone Encounter (Signed)
Will refill patient's metformin.  However it is important that he be seen in the office.  He does have normal renal function on recent CMP is from cancer center.  CBGs do appear to be elevated on CMP is however unclear if patient was fasting at that time so we will avoid giving him glipizide until he can be evaluated in the office.  [name] Srishti Strnad, DO PGY-3, [facility]

## 2019-07-25 ENCOUNTER — Other Ambulatory Visit: Payer: Self-pay

## 2019-07-25 ENCOUNTER — Ambulatory Visit (INDEPENDENT_AMBULATORY_CARE_PROVIDER_SITE_OTHER): Payer: Medicaid Other | Admitting: Family Medicine

## 2019-07-25 ENCOUNTER — Encounter: Payer: Self-pay | Admitting: Family Medicine

## 2019-07-25 VITALS — BP 150/70 | HR 84 | Ht 60.0 in | Wt 182.1 lb

## 2019-07-25 DIAGNOSIS — G629 Polyneuropathy, unspecified: Secondary | ICD-10-CM

## 2019-07-25 DIAGNOSIS — E1165 Type 2 diabetes mellitus with hyperglycemia: Secondary | ICD-10-CM | POA: Diagnosis not present

## 2019-07-25 DIAGNOSIS — C182 Malignant neoplasm of ascending colon: Secondary | ICD-10-CM

## 2019-07-25 DIAGNOSIS — I10 Essential (primary) hypertension: Secondary | ICD-10-CM | POA: Diagnosis not present

## 2019-07-25 LAB — POCT GLYCOSYLATED HEMOGLOBIN (HGB A1C): HbA1c, POC (controlled diabetic range): 10.6 % — AB (ref 0.0–7.0)

## 2019-07-25 MED ORDER — GABAPENTIN 300 MG PO CAPS: 300.0000 mg | ORAL_CAPSULE | Freq: Three times a day (TID) | ORAL | 1 refills | Status: DC

## 2019-07-25 MED ORDER — SITAGLIPTIN PHOSPHATE 50 MG PO TABS: 50.0000 mg | ORAL_TABLET | Freq: Every day | ORAL | 3 refills | Status: DC

## 2019-07-25 MED ORDER — METFORMIN HCL 1000 MG PO TABS: 1000.0000 mg | ORAL_TABLET | Freq: Two times a day (BID) | ORAL | 1 refills | Status: DC

## 2019-07-25 MED ORDER — GLIPIZIDE ER 5 MG PO TB24: 5.0000 mg | ORAL_TABLET | Freq: Two times a day (BID) | ORAL | 1 refills | Status: DC

## 2019-07-25 NOTE — Patient Instructions (Addendum)
Thank you for coming to see me today. It was a pleasure! Today we talked about:   Diabetes: Please continue taking metformin 1,000 twice daily with meals, as well as glipizide 5mg  twice daily. I have started you on Januvia 50mg  once daily.  For your neuropathic pain: Please take 300mg  of gabapentin three times per day.   I have scheduled you for a shoulder injection on 08/02/2019 at 10:10am.    Please follow-up with me 3 months or sooner as needed.  If you have any questions or concerns, please do not hesitate to call the office at [phone].  Take Care,   [name] Jahmire Ruffins, DO

## 2019-07-25 NOTE — Progress Notes (Addendum)
Subjective:  Patient ID: Blake Vaughn  DOB: 10-15-1961 MRN: [id number]  Blake Vaughn is a 58 y.o. male with a PMH of HTN, h/o cancer of R colon, H2CN, h/o follicular lymphoma, here today for f/u diabetes.   Attempted to use [employer] video interpretation but was unable to connect.  Patient called relative who is interpreter at another healthcare facility who interpreted over the phone for [place].  HPI:  Diabetes: Disease Monitoring: - Blood Sugar ranges-does not check his blood sugar at home - Medications: Metformin 1000 mg twice daily, glipizide, Compliant: Has been out of his medications due to not being seen in the offic  - On Aspirin, and on statin - Last eye exam:  Will go for an appointment - Last foot exam:  Deferred today ROS: denies hypoglycemic sx, dizziness, diaphoresis, LOC, polyuria, polydipsia  Monitoring Labs and Parameters - Last A1C:  Lab Results  Component Value Date   HGBA1C 10.6 (A) 07/25/2019   Neuropathic pain: -Patient reports that his hands often tingle which he believes is due to the chemotherapy and is improving and is managed by his oncologist. -He reports that he also experiences some tingling in his feet bilaterally.  He is previously taken metformin 100 mg nightly. - Denies any SOB, CP, vision changes, LE edema, medication SEs, or symptoms of hypotension  Hypertension: - Medications: Currently not on any medications for blood pressure - Checking BP at home: no - Denies any SOB, CP, vision changes, LE edema, medication SEs, or symptoms of hypotension  ROS: as mentioned in HPI  Social hx: Denies use of illicit drugs, alcohol use Smoking status reviewed  Patient Active Problem List   Diagnosis Date Noted  . Neuropathy 07/30/2019  . Port-A-Cath in place 11/25/2018  . Cancer of right colon ([place]) 11/12/2018  . Syncope due to orthostatic hypotension 11/05/2018  . Hypoalbuminemia 11/05/2018  . Hypoproteinemia ([place]) 11/05/2018  .  Uses Spanish as primary spoken language 10/23/2018  . Obesity (BMI 30-39.9) 10/23/2018  . Mass of cecum s/p right colectomy 10/21/2018 10/22/2018  . Nocturnal leg cramps 07/16/2017  . HTN (hypertension) 11/27/2011  . Pure hypercholesterolemia 09/24/2009  . 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 Diabetes type 2, uncontrolled ([place]) 07/09/2009  . 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 Follicular lymphoma ([place]), 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     Objective:  BP (!) 150/70   Pulse 84   Ht 5' (1.524 m)   Wt 182 lb 2 oz (82.6 kg)   SpO2 98%   BMI 35.57 kg/m   Vitals and nursing note reviewed  General: NAD,  Pulm: normal effort Extremities: no edema or cyanosis. WWP. Skin: warm and dry, no rashes noted Neuro: alert and oriented, no focal deficits Psych: normal affect, normal thought content  Assessment & Plan:   Diabetes type 2, uncontrolled (HCC) A1c uncontrolled at 10.6.  Given difficulty with interpreter today do not feel that patient would safely be able to be educated on how to use insulin.  We will continue with oral therapy.  Patient has been out of his glipizide due to not coming into the office for a visit. Recent CMP and CBC at cancer center on 06/02/2019 with normal renal function and electrolytes.  -Patient will continue on metformin 1000 mg twice daily.   -Will restart patient's glipizide 5 mg twice daily.  -Patient provided with glucometer and instructed to check his sugars daily.  -will also start patient on januvia 50mg  daily  -return in 3 moths for re-check  HTN (hypertension) BP 150/70 today in office. BP 157/82 at cancer center on 06/02/2019.  BP 131/80 during cancer center visit on 05/05/2019.   Had difficult time addressing diabetes due to issues with translating. Patient to return to office next week on 8/25 for shoulder injection and can have recheck of BP at that time.  -May need to start ARB therapy given h/o diabetes.   Neuropathy Patient with poorly controlled  diabetes and history of neuropathic pain that could be caused to recent chemotherapy as well as diabetes. Patient previously on gabapentin 100 mg 3 times a day.  Will increase to 300 mg 3 times per day.  Patient with recent normal renal function on 06/02/2019.  Patient made inappropriate comments during visit and asked to not do this in the future.   Also schedule patient return visit for shoulder injections given that he is complaining of shoulder pain.  Did not have time to address during this office visit so scheduled him for an appointment on 08/02/2019 at 10 AM.  Health maintenance Will obtain hep C, urine microalbumin at visit on 08/02/2019.  Patient due for pneumonia vaccine as well as Tdap Patient to have colonoscopy in 1 year per oncology note 04/21/2019.  Will perform foot exam at next office visit.  Patient encouraged to see an eye doctor for an ophthalmology exam.  [name] Ivory Bail, DO Family Medicine Resident PGY-3

## 2019-07-26 ENCOUNTER — Telehealth: Payer: Self-pay | Admitting: Family Medicine

## 2019-07-26 NOTE — Telephone Encounter (Signed)
Patient is having a problem with the Januvia, he is worried about what will happen to his heart on this medication.   Not sure I understand him completely but please give a call if necessary to # [phone].  It goes to [facility] [address]

## 2019-07-26 NOTE — Telephone Encounter (Signed)
Spoke to patient and explained to him that his glipizide has been changed from 10mg  BID to 5mg  BID and also that the side effects listed on medications are required by law.  That doesn't necessarily mean that they will happen to the patient but it is just things to look out for.  He was concerned about Januvia causing rapid heartbeat.  I advised him to try the medication for a week and see how he feels.  If he has no symptoms then to continue medication as prescribed until he comes back in to see doctor.  Patient voiced understanding and no further questions.  Jazmin Hartsell,CMA

## 2019-07-30 ENCOUNTER — Encounter: Payer: Self-pay | Admitting: Family Medicine

## 2019-07-30 DIAGNOSIS — G629 Polyneuropathy, unspecified: Secondary | ICD-10-CM | POA: Insufficient documentation

## 2019-07-30 MED ORDER — ACCU-CHEK AVIVA PLUS VI STRP: ORAL_STRIP | 12 refills | Status: DC

## 2019-07-30 MED ORDER — ACCU-CHEK AVIVA PLUS W/DEVICE KIT: 1.0000 [IU] | PACK | Freq: Once | 0 refills | Status: AC

## 2019-07-30 MED ORDER — ACCU-CHEK SOFTCLIX LANCETS MISC: 12 refills | Status: DC

## 2019-07-30 MED ORDER — ACCU-CHEK SOFTCLIX LANCET DEV KIT: 1.0000 [IU] | PACK | Freq: Once | 0 refills | Status: AC

## 2019-07-30 NOTE — Addendum Note (Signed)
Addended by: Jayln [place], [name] J on: 07/30/2019 03:40 PM   Modules accepted: Orders

## 2019-07-30 NOTE — Assessment & Plan Note (Signed)
Patient with poorly controlled diabetes and history of neuropathic pain that could be caused to recent chemotherapy as well as diabetes. Patient previously on gabapentin 100 mg 3 times a day.  Will increase to 300 mg 3 times per day.  Patient with recent normal renal function on 06/02/2019.

## 2019-07-30 NOTE — Assessment & Plan Note (Addendum)
BP 150/70 today in office. BP 157/82 at cancer center on 06/02/2019.  BP 131/80 during cancer center visit on 05/05/2019.   Had difficult time addressing diabetes due to issues with translating. Patient to return to office next week on 8/25 for shoulder injection and can have recheck of BP at that time.  -May need to start ARB therapy given h/o diabetes.

## 2019-07-30 NOTE — Assessment & Plan Note (Signed)
A1c uncontrolled at 10.6.  Given difficulty with interpreter today do not feel that patient would safely be able to be educated on how to use insulin.  We will continue with oral therapy.  Patient has been out of his glipizide due to not coming into the office for a visit. Recent CMP and CBC at cancer center on 06/02/2019 with normal renal function and electrolytes.  -Patient will continue on metformin 1000 mg twice daily.   -Will restart patient's glipizide 5 mg twice daily.  -Patient provided with glucometer and instructed to check his sugars daily.  -will also start patient on januvia 50mg  daily  -return in 3 moths for re-check

## 2019-08-02 ENCOUNTER — Encounter: Payer: Self-pay | Admitting: Family Medicine

## 2019-08-02 ENCOUNTER — Other Ambulatory Visit: Payer: Self-pay

## 2019-08-02 ENCOUNTER — Ambulatory Visit (HOSPITAL_COMMUNITY)
Admission: RE | Admit: 2019-08-02 | Discharge: 2019-08-02 | Disposition: A | Discharge status: Home / Self Care | Payer: Medicaid Other | Source: Ambulatory Visit | Attending: Family Medicine | Admitting: Family Medicine

## 2019-08-02 ENCOUNTER — Ambulatory Visit (INDEPENDENT_AMBULATORY_CARE_PROVIDER_SITE_OTHER): Payer: Medicaid Other | Admitting: Family Medicine

## 2019-08-02 VITALS — BP 122/72 | HR 84

## 2019-08-02 DIAGNOSIS — G8929 Other chronic pain: Secondary | ICD-10-CM

## 2019-08-02 DIAGNOSIS — M25511 Pain in right shoulder: Secondary | ICD-10-CM | POA: Insufficient documentation

## 2019-08-02 DIAGNOSIS — E1165 Type 2 diabetes mellitus with hyperglycemia: Secondary | ICD-10-CM

## 2019-08-02 DIAGNOSIS — M19011 Primary osteoarthritis, right shoulder: Secondary | ICD-10-CM | POA: Diagnosis not present

## 2019-08-02 DIAGNOSIS — M25512 Pain in left shoulder: Secondary | ICD-10-CM | POA: Diagnosis not present

## 2019-08-02 LAB — GLUCOSE, POCT (MANUAL RESULT ENTRY): POC Glucose: 318 mg/dl — AB (ref 70–99)

## 2019-08-02 IMAGING — DX LEFT SHOULDER - 2+ VIEW
4 series · 4 of 4 positions shown · non-contrast
Comparison: None

CLINICAL DATA: Chronic BILATERAL shoulder pain

EXAM:
LEFT SHOULDER - 2+ VIEW

[w shoulder internal left]
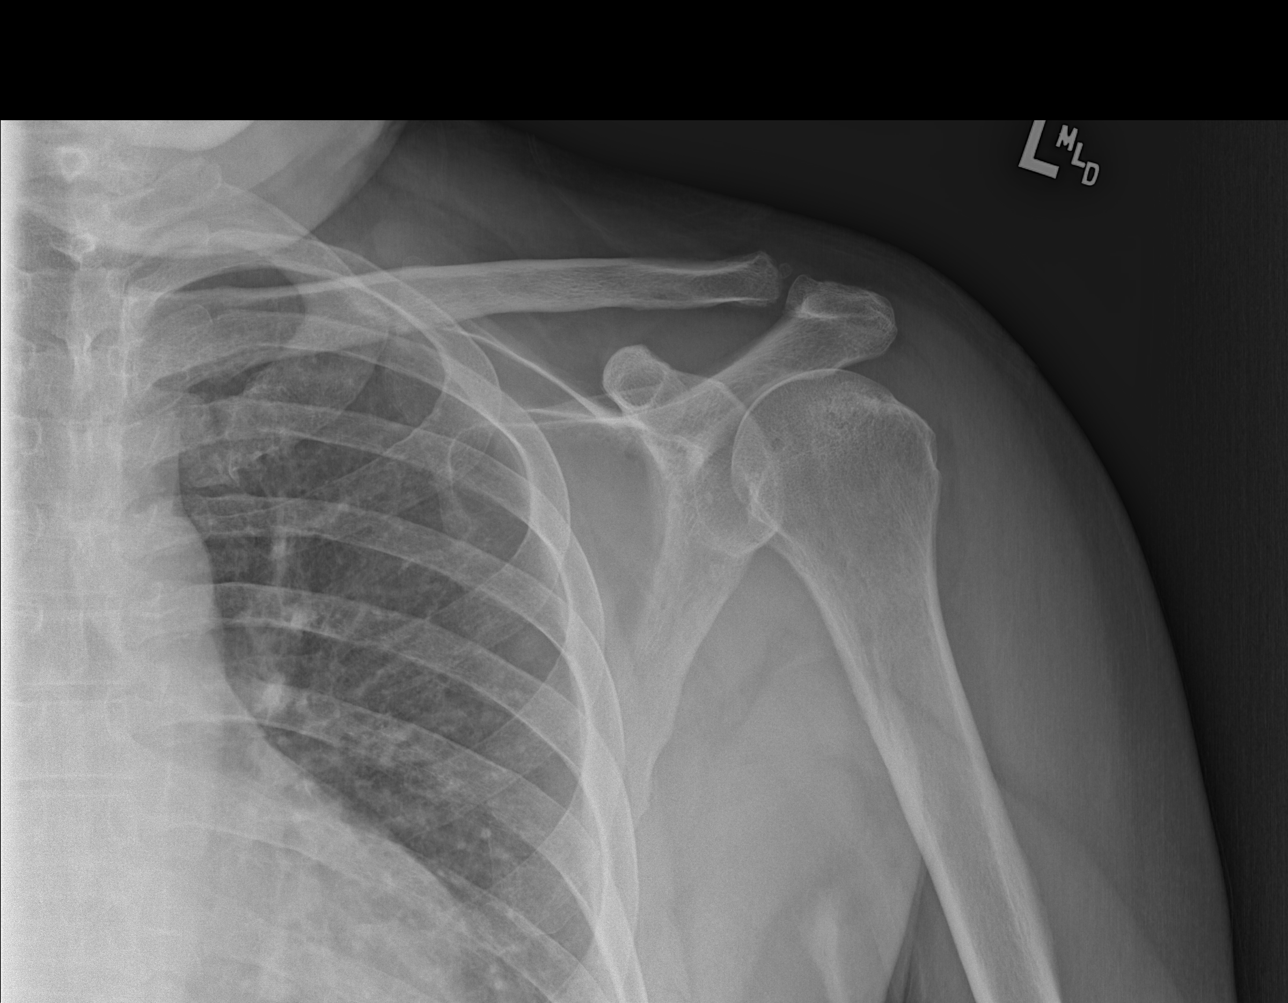

[w shoulder external left]
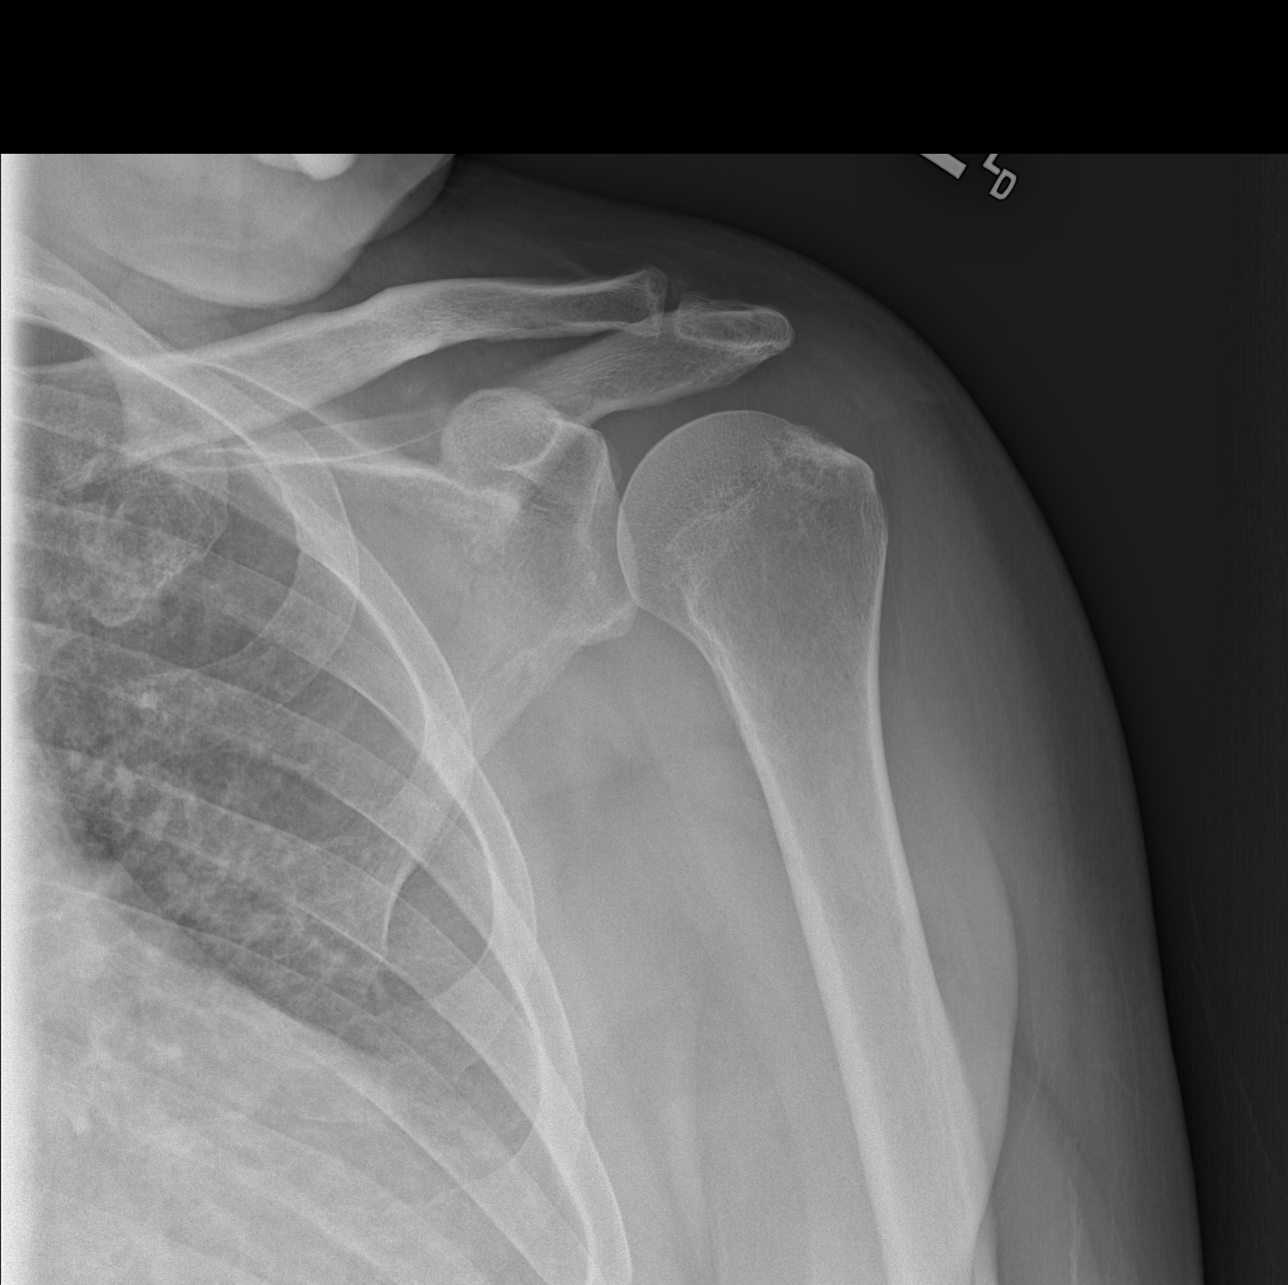

[w shoulder y-view left]
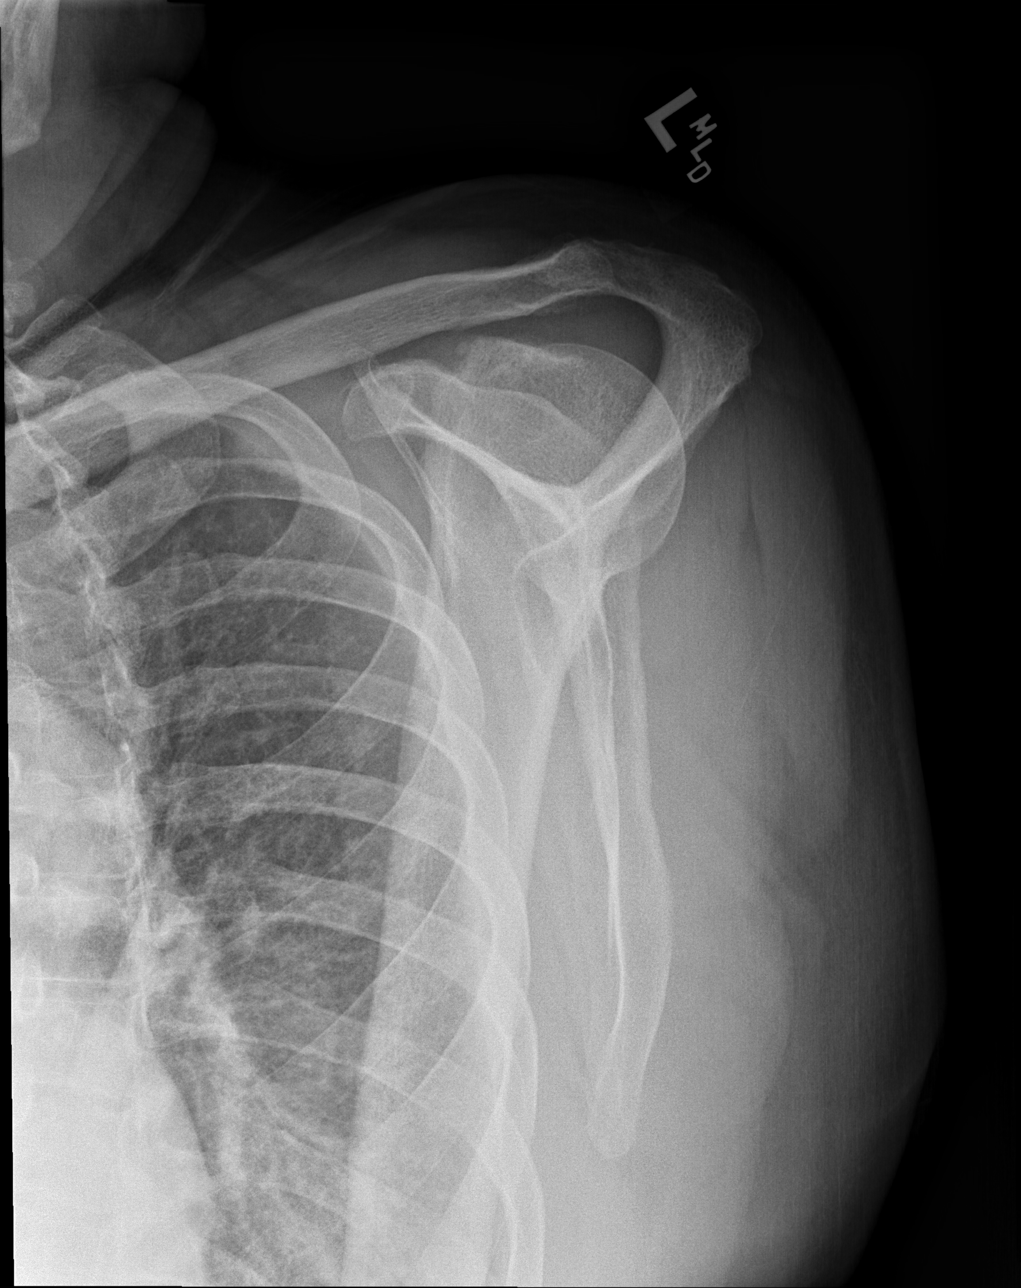

[x shoulder axillary left]
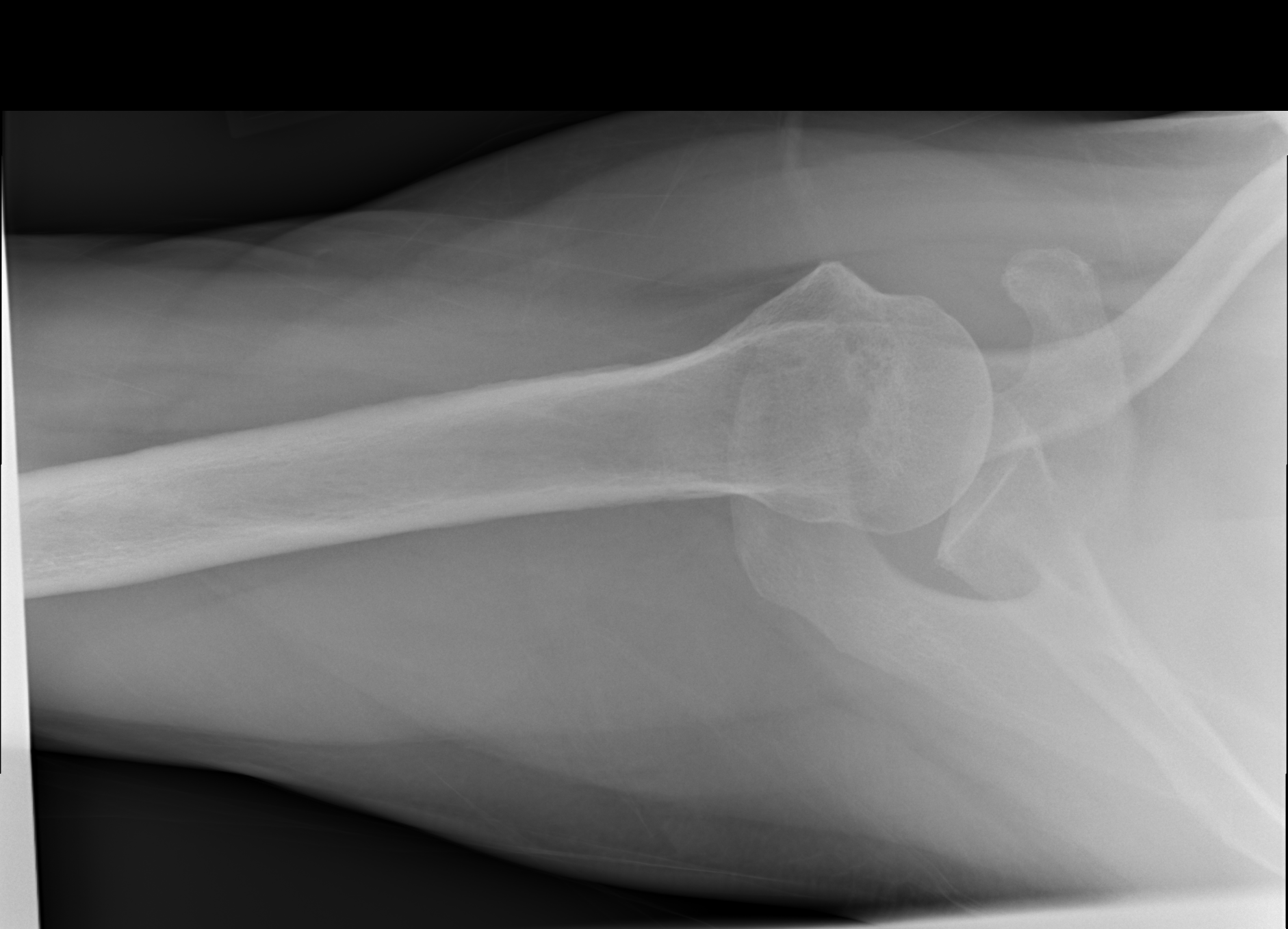

[4 of 4 positions shown; findings below may reference images not displayed]

FINDINGS: Osseous mineralization normal.

AC joint alignment normal.

No acute fracture, dislocation or bone destruction.

Visualized ribs unremarkable.
IMPRESSION: Normal exam.

## 2019-08-02 IMAGING — DX RIGHT SHOULDER - 2+ VIEW
4 series · 4 of 4 positions shown · non-contrast
Comparison: [DATE]

CLINICAL DATA: Pain

EXAM:
RIGHT SHOULDER - 2+ VIEW

[w shoulder external right]
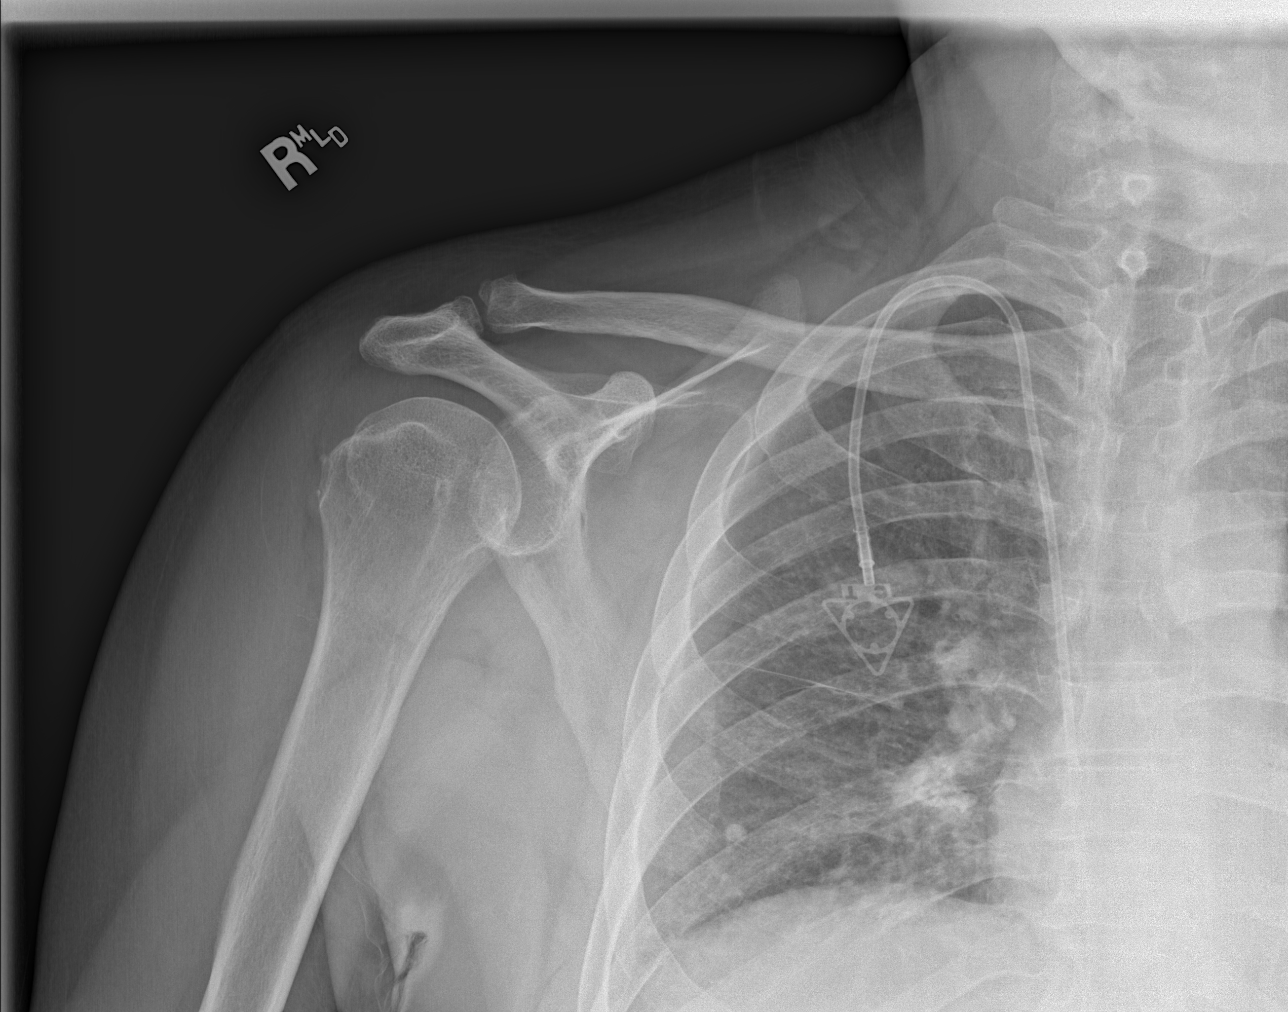

[w shoulder internal right]
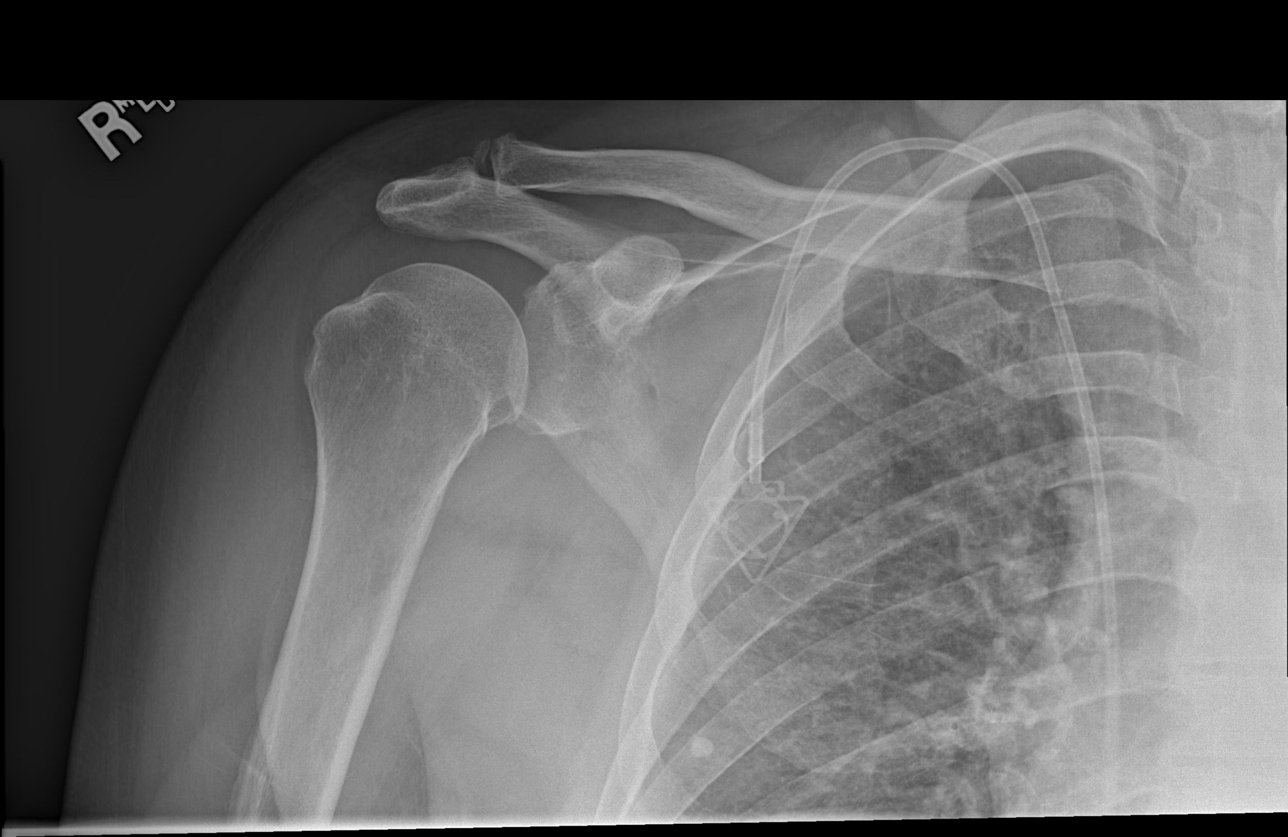

[w shoulder y-view right]
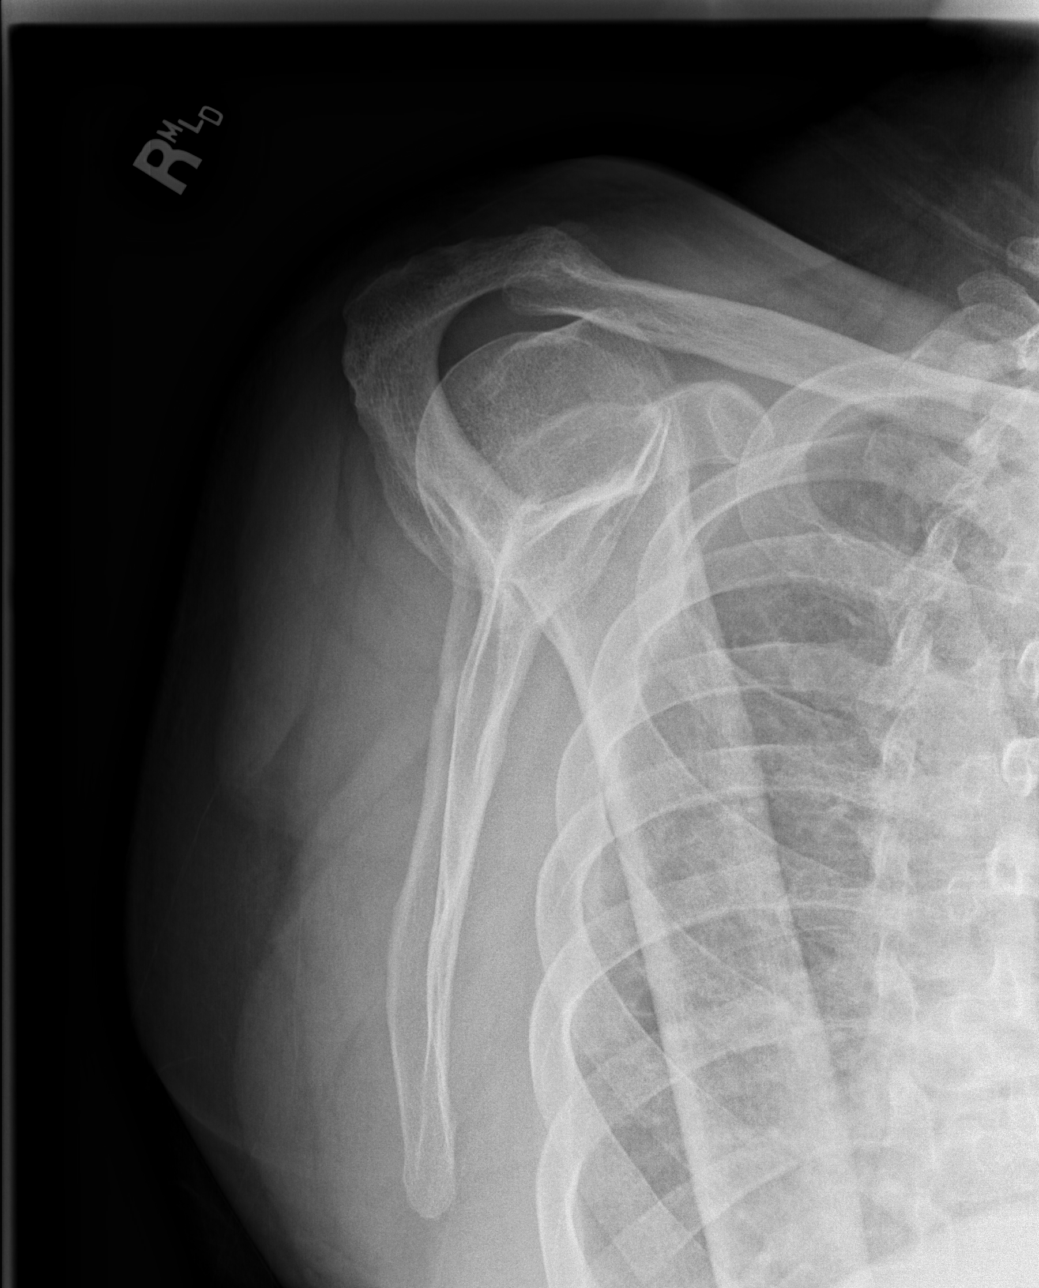

[x shoulder axillary right]
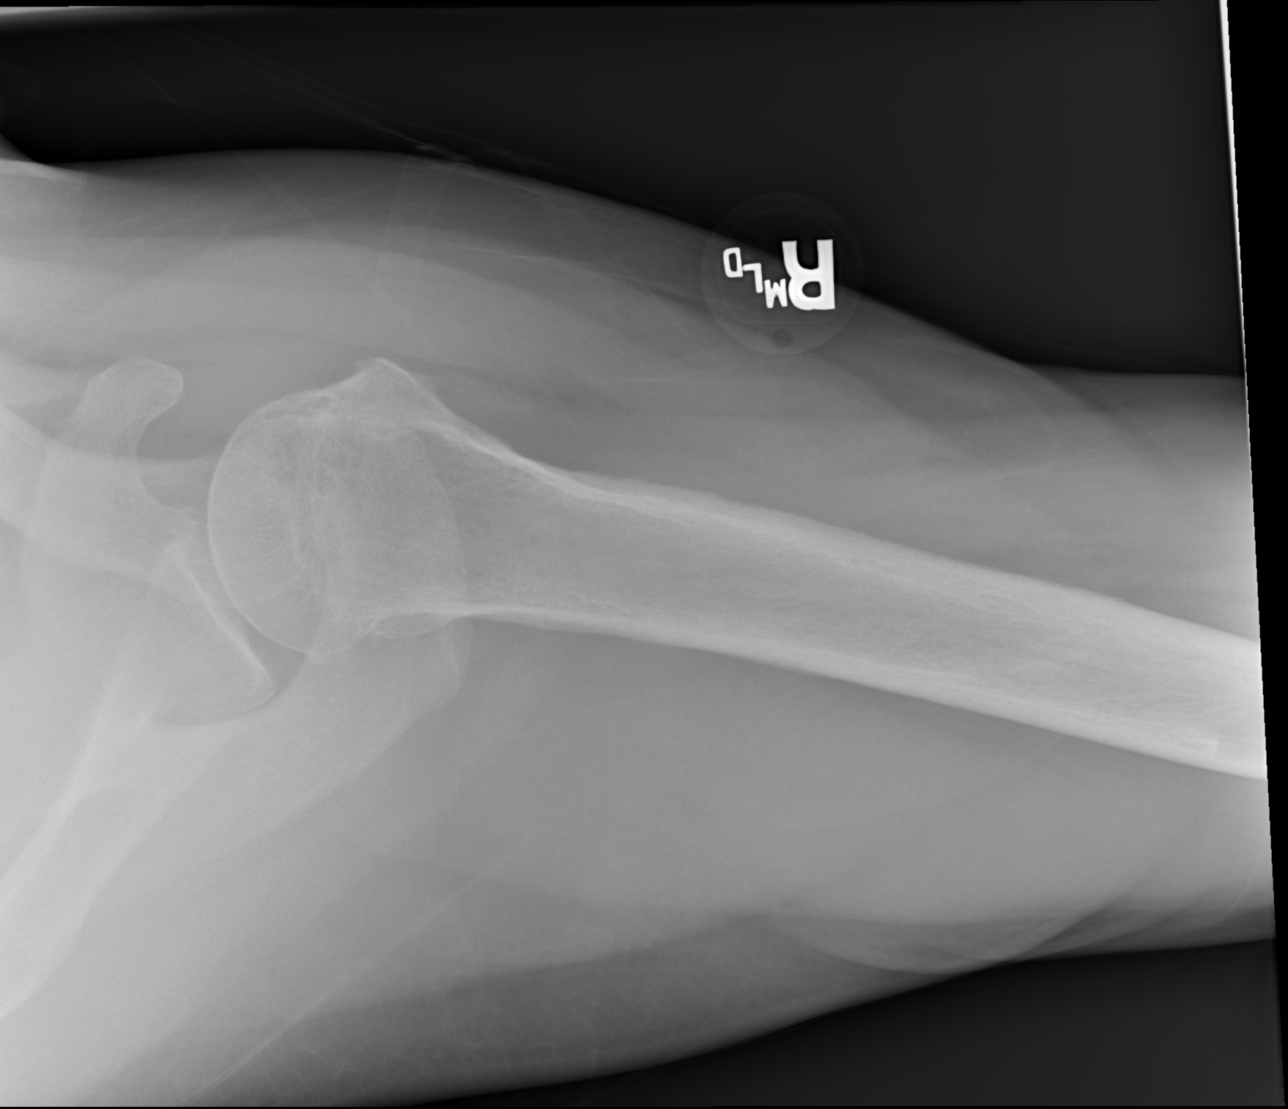

[4 of 4 positions shown; findings below may reference images not displayed]

FINDINGS: Frontal, oblique, Y scapular, and axillary images obtained. No
fracture or dislocation. There is mild generalized osteoarthritic
change. No erosive change.

There is a calcified granuloma in the right lung. Port-A-Cath tip is
in the right atrium.
IMPRESSION: Mild generalized osteoarthritic change. No fracture or dislocation.
No erosive change.

Port-A-Cath present on the right. Calcified granuloma noted right
lower lung region.

## 2019-08-02 NOTE — Progress Notes (Signed)
Subjective: Chief Complaint  Patient presents with  . Shoulder Pain    HPI: Blake Vaughn is a 58 y.o. presenting to clinic today to discuss the following:  Bilateral Shoulder Pain Patient presents today due to 8-9 months of shoulder pain in both shoulders. Worse on the right than the left. He has a difficult time described the quality of the pain but today rates it at a 4/10 and says it is everyday and constant. At times, he gets busy and can "forget about it" but it is always there. Worse with movement and if he sleeps on his sides. Also seems to be worse at night time. No trauma or accidents, or falls. No known precipitation event.  An interpreter was used for the entirety of this visit.  ROS noted in HPI.   Past Medical, Surgical, Social, and Family History Reviewed & Updated per EMR.   Pertinent Historical Findings include:   Social History   Tobacco Use  Smoking Status Former Smoker  . Packs/day: 0.50  . Years: 30.00  . Pack years: 15.00  . Quit date: 12/08/2004  . Years since quitting: 14.6  Smokeless Tobacco Former Systems developer  . Quit date: 11/20/2006   Objective: BP 122/72   Pulse 84   SpO2 96%  Vitals and nursing notes reviewed  Physical Exam Gen: Alert and Oriented x 3, NAD CV: RRR, no murmurs, normal S1, S2 split Resp: CTAB, no wheezing, rales, or rhonchi, comfortable work of breathing Ext: no clubbing, cyanosis, or edema Skin: warm, dry, intact, no rashes MSK: Shoulder, Bilateral: TTP noted at the [facility] Joint in both shoulders. No evidence of bony deformity, asymmetry, or muscle atrophy; No tenderness over long head of biceps (bicipital groove). Limited active range of motion in flexion, abduction, and adduction. Normal passive range of motion (180 flex Huel Cote /150Abd /90ER /70IR), Thumb to T12 without significant tenderness. Strength 5/5 throughout. No abnormal scapular function observed. Sensation intact. Peripheral pulses intact.  Special Tests:   -  Crossarm test: NEG   - Empty can: NEG   - Hawkins: NEG   - Obrien's test: NEG   - Crank test: Positive   - Apprehension test: Postitive   Results for orders placed or performed in visit on 08/02/19 (from the past 72 hour(s))  Glucose (CBG)     Status: Abnormal   Collection Time: 08/02/19 10:44 AM  Result Value Ref Range   POC Glucose 318 (A) 70 - 99 mg/dl    Assessment/Plan:  Bilateral shoulder pain Given symptoms and exam I am suspicious of multiple etiologies contributing to his shoulder pain. 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 Special testing was also consistent with labrum pathology although he has no symptoms of popping, locking up, or shoulder coming out of place. Although special testing did not strongly indicate any impingement it could still be a component of his pain. - Start with bilateral x-rays and based on findings may start with oral meloxicam for 2 weeks and then PT - Consider getting ultrasound at next appointment to look for [facility] pathology and/or impingement; if present consider subacromial injection - For labral issues would require MRI; I would wait to see if conservative management above offers any help and if not get MRI and consider sending to Ortho/Sports Med - Return to see me in one week to consider injections vs conservative management Did not get shoulder injections today due to elevated blood glucose.  PATIENT EDUCATION PROVIDED: See  AVS    Diagnosis and plan along with any newly prescribed medication(s) were discussed in detail with this patient today. The patient verbalized understanding and agreed with the plan. Patient advised if symptoms worsen return to clinic or ER.    Orders Placed This Encounter  Procedures  . DG Shoulder Left    Standing Status:   Future    Number of Occurrences:   1    Standing Expiration Date:   10/01/2020    Order Specific Question:   Reason for Exam (SYMPTOM  OR DIAGNOSIS  REQUIRED)    Answer:   pain    Order Specific Question:   Preferred imaging location?    Answer:   [facility]    Order Specific Question:   Radiology Contrast Protocol - do NOT remove file path    Answer:   \\[email or web address]  . DG Shoulder Right    Standing Status:   Future    Number of Occurrences:   1    Standing Expiration Date:   10/01/2020    Order Specific Question:   Reason for Exam (SYMPTOM  OR DIAGNOSIS REQUIRED)    Answer:   pain    Order Specific Question:   Preferred imaging location?    Answer:   [facility]    Order Specific Question:   Radiology Contrast Protocol - do NOT remove file path    Answer:   \\[email or web address]s.[email or web address]  . Glucose (CBG)    No orders of the defined types were placed in this encounter.    Harolyn Rutherford, DO 08/02/2019, 10:38 AM PGY-3 [place]

## 2019-08-02 NOTE — Patient Instructions (Signed)
  Fue un placer conocerte hoy! Gracias por permitirme participar en su cuidado!  Hoy, hablamos sobre su dolor de hombro bilateral. Creo que hay varias cosas que estn causando su dolor. Creo que tiene algo de artritis en el hombro y puede beneficiarse de una inyeccin de esteroides. Sin embargo, su nivel de azcar en sangre todava est alto hoy y me gustara que lo controlara mejor antes de que le administremos una inyeccin. Tambin he ordenado radiografas para evaluar sus hombros.  Programe una cita de seguimiento con su PCP, la Dra. Enid Derry, para controlar mejor su diabetes. Lo ver en una semana para ver si su nivel de azcar en sangre est mejor y decidir si an necesita una inyeccin.  [place], Harolyn Rutherford, DO PGY-3, [facility] [place]  It was great to meet you today! Thank you for letting me participate in your care!  Today, we discussed your bilateral shoulder pain. I think you have several things going on causing your pain. I do think you have some shoulder arthritis and may benefit from a steroid injection. However, your blood sugar is still high today and I would like you to get it under better control before we give you an injection. I have also ordered x-rays to assess your shoulders.  Please scheduled a follow up appointment with your PCP Dr. Enid Derry to get better control of your diabetes. I will see you in one week to see if your blood sugar is better and decide if you still need an injection.  Be well, Harolyn Rutherford, DO PGY-3, [facility]

## 2019-08-03 NOTE — Assessment & Plan Note (Addendum)
Given symptoms and exam I am suspicious of multiple etiologies contributing to his shoulder pain. 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 Special testing was also consistent with labrum pathology although he has no symptoms of popping, locking up, or shoulder coming out of place. Although special testing did not strongly indicate any impingement it could still be a component of his pain. - Start with bilateral x-rays and based on findings may start with oral meloxicam for 2 weeks and then PT - Consider getting ultrasound at next appointment to look for [facility] pathology and/or impingement; if present consider subacromial injection - For labral issues would require MRI; I would wait to see if conservative management above offers any help and if not get MRI and consider sending to Ortho/Sports Med - Return to see me in one week to consider injections vs conservative management

## 2019-08-08 ENCOUNTER — Telehealth: Payer: Self-pay | Admitting: Family Medicine

## 2019-08-08 NOTE — Telephone Encounter (Signed)
Pt wants to change his medications Januvia and Glipizide because he's head starts hurting and sugar level not dropping, stated that he wants to get his old medication back metformin 1000mg  and glipizide 10mg . Best phone # to contact [phone].

## 2019-08-08 NOTE — Telephone Encounter (Signed)
Will forward to MD to advise.  Patient has an upcoming appt on Friday.  Karalyne Nusser,CMA

## 2019-08-09 NOTE — Telephone Encounter (Signed)
Will call patient later today, as he did not answer, but he is supposed to be taking metformin, glipizide and Januvia. The only change was the addition of Januvia. I lowered the glipizide, but he can start taking 10mg  bid.

## 2019-08-12 ENCOUNTER — Ambulatory Visit: Payer: Medicaid Other | Admitting: Family Medicine

## 2019-08-12 ENCOUNTER — Encounter: Payer: Self-pay | Admitting: Family Medicine

## 2019-08-12 ENCOUNTER — Other Ambulatory Visit: Payer: Self-pay

## 2019-08-12 VITALS — HR 79

## 2019-08-12 DIAGNOSIS — E1165 Type 2 diabetes mellitus with hyperglycemia: Secondary | ICD-10-CM | POA: Diagnosis present

## 2019-08-12 DIAGNOSIS — M25511 Pain in right shoulder: Secondary | ICD-10-CM

## 2019-08-12 LAB — GLUCOSE, POCT (MANUAL RESULT ENTRY): POC Glucose: 245 mg/dl — AB (ref 70–99)

## 2019-08-12 MED ORDER — METHYLPREDNISOLONE ACETATE 40 MG/ML IJ SUSP
40.0000 mg | Freq: Once | INTRAMUSCULAR | Status: AC
  Administered 2019-08-12: 40 mg via INTRA_ARTICULAR

## 2019-08-12 NOTE — Progress Notes (Signed)
Subjective: Chief Complaint  Patient presents with  . Shoulder Pain    HPI: Blake Vaughn is a 58 y.o. presenting to clinic today to discuss the following:  Right Shoulder Pain Patient continues to have right shoulder pain that has not improved since last visit on oral NSAIDs. The quality and frequency of the pain remain the same. He has pain every day, worse with movement, better with rest, and it does not radiate from his right shoulder. Of note, he has the same type of pain in his left shoulder but his right hurts him more than his left. He had bilateral shoulder x-rays that showed some degenerative changes in his right shoulder. The left was normal.  ROS noted in HPI.   Past Medical, Surgical, Social, and Family History Reviewed & Updated per EMR.   Pertinent Historical Findings include:   Social History   Tobacco Use  Smoking Status Former Smoker  . Packs/day: 0.50  . Years: 30.00  . Pack years: 15.00  . Quit date: 12/08/2004  . Years since quitting: 14.6  Smokeless Tobacco Former Systems developer  . Quit date: 11/20/2006   Objective: Pulse 79   SpO2 98%  Vitals and nursing notes reviewed  Physical Exam Gen: Alert and Oriented x 3, NAD CV: RRR, no murmurs, normal S1, S2 split Resp: CTAB, no wheezing, rales, or rhonchi, comfortable work of breathing Abd: non-distended, non-tender, soft, +bs in all four quadrants MSK: Shoulder, Right: No evidence of bony deformity, asymmetry, or muscle atrophy; No tenderness over long head of biceps (bicipital groove). No TTP at [facility] joint. Decreased active and passive range of motion in flex, ext, and abduction, Thumb to T12 without significant tenderness. Strength 5/5 throughout. No abnormal scapular function observed. Sensation intact. Peripheral pulses intact.  Special Tests:   - Crossarm test: NEG   - Empty can: NEG   - Hawkins: Positive   - Neer test: NEG   - Yergason's: NEG   - Crank test: Positive   - Apprehension test: Positive  Ext: no clubbing, cyanosis, or edema Skin: warm, dry, intact, no rashes  Assessment/Plan:  Right shoulder pain Patient with continue right shoulder pain consistent with several pathologies. 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 X-rays show signs of degenerative disease as well. His pain was not helped by two weeks of high dose NSAIDs. - 4:1 corticosteroid injection today to see if he gets any pain relief.  - If no improvement I would send to ortho to perhaps have an MRI and assess the need for surgery.  INJECTION: Patient was given informed consent, signed copy in the chart. Appropriate time out was taken. Area prepped and draped in usual sterile fashion. 1 cc of methylprednisolone 40 mg/ml plus  4 cc of 1% lidocaine without epinephrine was injected into the subacromial space of the right shoulder using a(n) posterior lateral approach. The patient tolerated the procedure well. There were no complications. Post procedure instructions were given.   PATIENT EDUCATION PROVIDED: See AVS    Diagnosis and plan along with any newly prescribed medication(s) were discussed in detail with this patient today. The patient verbalized understanding and agreed with the plan. Patient advised if symptoms worsen return to clinic or ER.    Orders Placed This Encounter  Procedures  . Glucose (CBG)    Meds ordered this encounter  Medications  . methylPREDNISolone acetate (DEPO-MEDROL) injection 40 mg   Harolyn Rutherford, DO 08/12/2019, 9:53 AM PGY-3 [place]  [facility]

## 2019-08-12 NOTE — Patient Instructions (Addendum)
  Fue genial verte hoy! Gracias por permitirme participar en su cuidado!  Hoy, hablamos sobre su continuo dolor en el hombro derecho y le inyectamos un esteroide en el rea. Espero que esto le [employer] dolor y dure al menos varios meses. Si no mejora, comunquese con nosotros en la oficina.  [place], Harolyn Rutherford, DO PGY-3, [facility] familiar [facility] [place]   It was great to see you today! Thank you for letting me participate in your care!  Today, we discussed your continued right shoulder pain and we injected the area with a steroid. I hope this gives you pain relief and lasts at least several months. If it does not get better please follow up with [place] in the office.  Be well, Harolyn Rutherford, DO PGY-3, [facility]

## 2019-08-16 DIAGNOSIS — M25511 Pain in right shoulder: Secondary | ICD-10-CM | POA: Insufficient documentation

## 2019-08-16 NOTE — Assessment & Plan Note (Signed)
Patient with continue right shoulder pain consistent with several pathologies. 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 X-rays show signs of degenerative disease as well. His pain was not helped by two weeks of high dose NSAIDs. - 4:1 corticosteroid injection today to see if he gets any pain relief.  - If no improvement I would send to ortho to perhaps have an MRI and assess the need for surgery.

## 2019-08-21 ENCOUNTER — Other Ambulatory Visit: Payer: Self-pay

## 2019-08-21 ENCOUNTER — Emergency Department (HOSPITAL_COMMUNITY): Payer: Medicaid Other

## 2019-08-21 ENCOUNTER — Encounter (HOSPITAL_COMMUNITY): Payer: Self-pay | Admitting: Emergency Medicine

## 2019-08-21 ENCOUNTER — Emergency Department (HOSPITAL_COMMUNITY)
Admission: EM | Admit: 2019-08-21 | Discharge: 2019-08-21 | Discharge status: Against Medical Advice | Payer: Medicaid Other | Attending: Emergency Medicine | Admitting: Emergency Medicine

## 2019-08-21 DIAGNOSIS — Z5321 Procedure and treatment not carried out due to patient leaving prior to being seen by health care provider: Secondary | ICD-10-CM | POA: Insufficient documentation

## 2019-08-21 DIAGNOSIS — J8410 Pulmonary fibrosis, unspecified: Secondary | ICD-10-CM | POA: Diagnosis not present

## 2019-08-21 DIAGNOSIS — R531 Weakness: Secondary | ICD-10-CM | POA: Diagnosis present

## 2019-08-21 LAB — CBC
HCT: 39.1 % (ref 39.0–52.0)
Hemoglobin: 13.8 g/dL (ref 13.0–17.0)
MCH: 30.9 pg (ref 26.0–34.0)
MCHC: 35.3 g/dL (ref 30.0–36.0)
MCV: 87.7 fL (ref 80.0–100.0)
Platelets: 188 10*3/uL (ref 150–400)
RBC: 4.46 MIL/uL (ref 4.22–5.81)
RDW: 12.9 % (ref 11.5–15.5)
WBC: 8.7 10*3/uL (ref 4.0–10.5)
nRBC: 0 % (ref 0.0–0.2)

## 2019-08-21 LAB — BASIC METABOLIC PANEL
Anion gap: 12 (ref 5–15)
BUN: 19 mg/dL (ref 6–20)
CO2: 19 mmol/L — ABNORMAL LOW (ref 22–32)
Calcium: 9.1 mg/dL (ref 8.9–10.3)
Chloride: 104 mmol/L (ref 98–111)
Creatinine, Ser: 0.93 mg/dL (ref 0.61–1.24)
GFR calc Af Amer: >60 mL/min (ref >60)
GFR calc non Af Amer: >60 mL/min (ref >60)
Glucose, Bld: 271 mg/dL — ABNORMAL HIGH (ref 70–99)
Potassium: 3.9 mmol/L (ref 3.5–5.1)
Sodium: 135 mmol/L (ref 135–145)

## 2019-08-21 LAB — CBG MONITORING, ED: Glucose-Capillary: 282 mg/dL — ABNORMAL HIGH (ref 70–99)

## 2019-08-21 LAB — TROPONIN I (HIGH SENSITIVITY): Troponin I (High Sensitivity): 8 ng/L (ref <18)

## 2019-08-21 IMAGING — CR DG CHEST 2V
2 series · 2 of 2 positions shown · non-contrast
Comparison: Chest CT [DATE]

CLINICAL DATA: Palpitations.

EXAM:
CHEST - 2 VIEW

[chest pa]
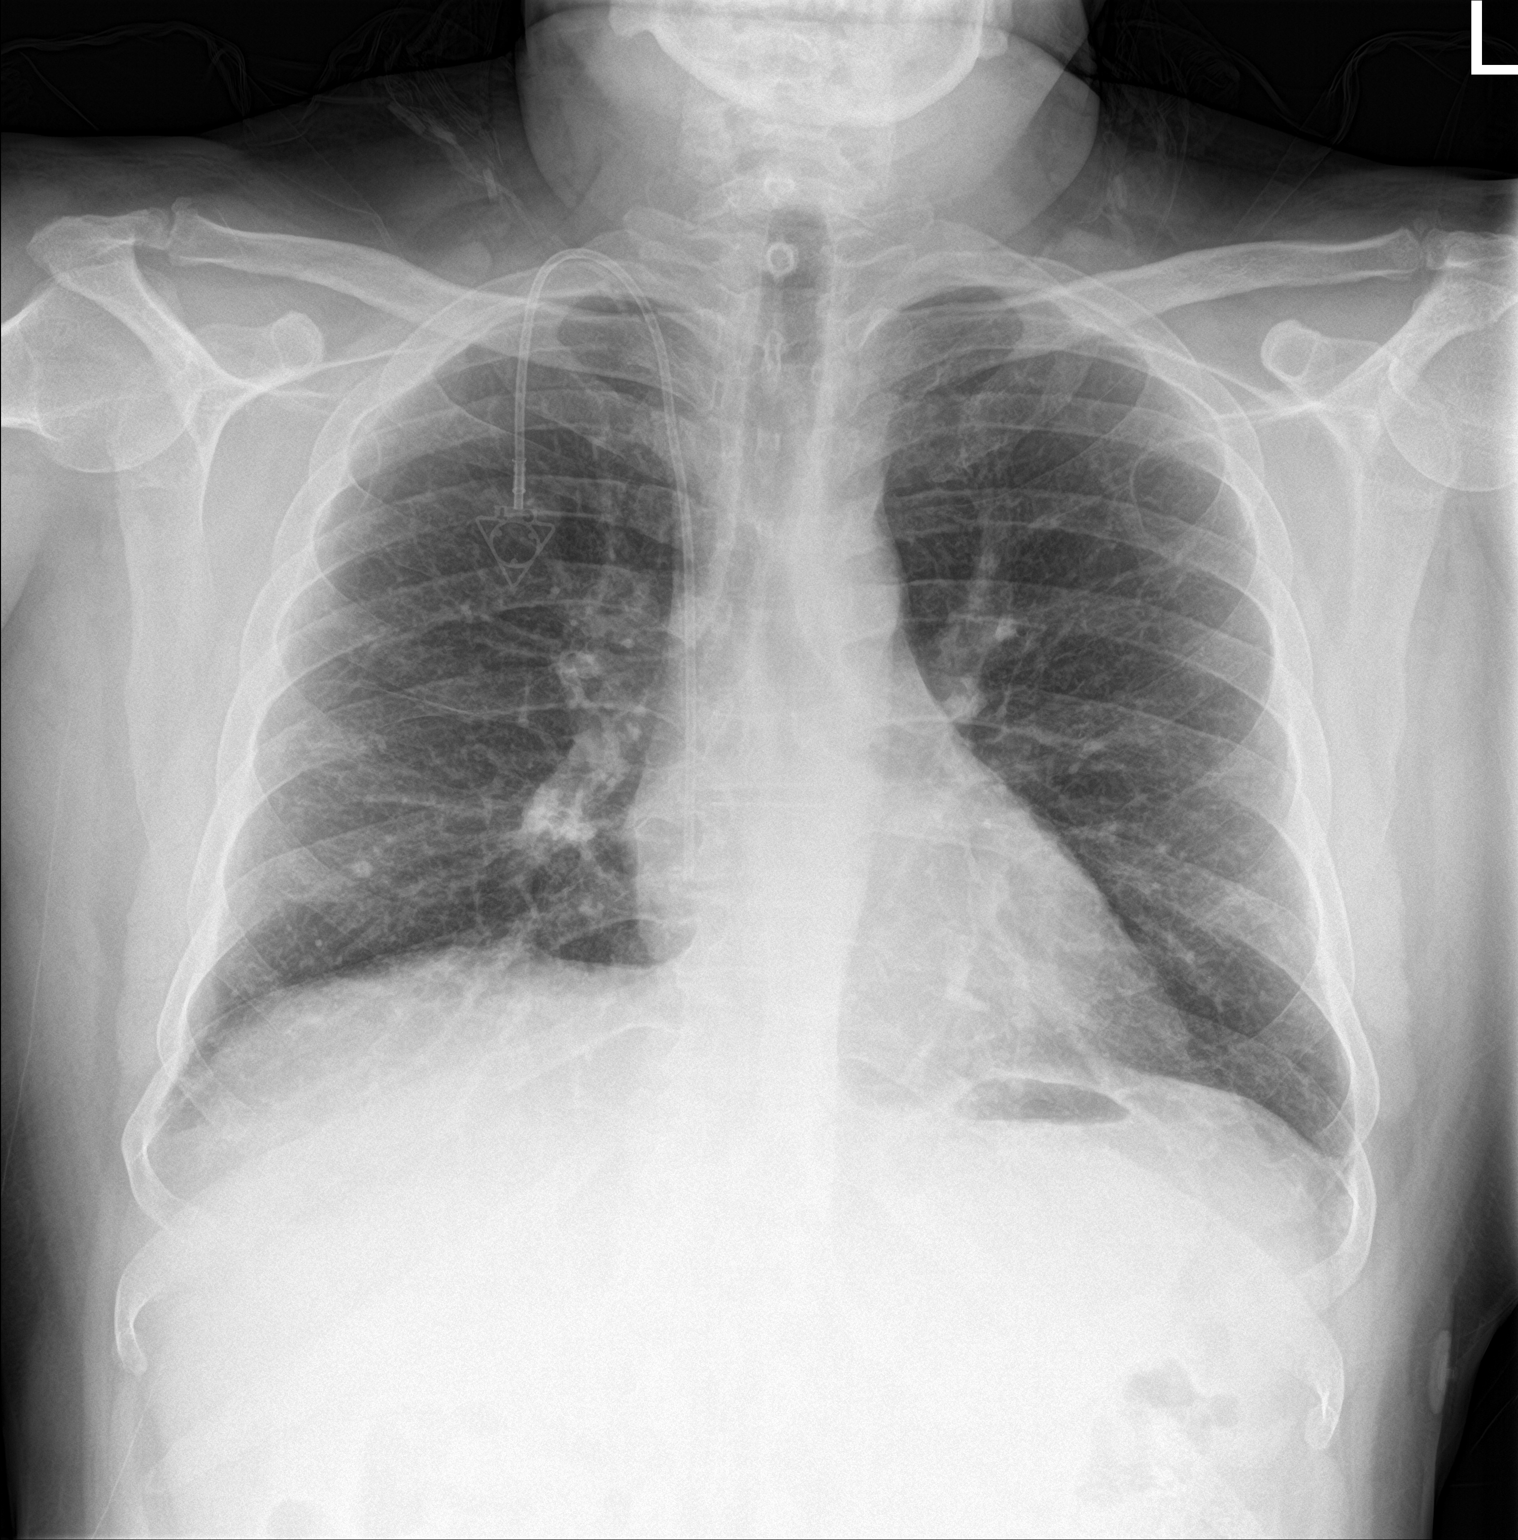

[chest lat]
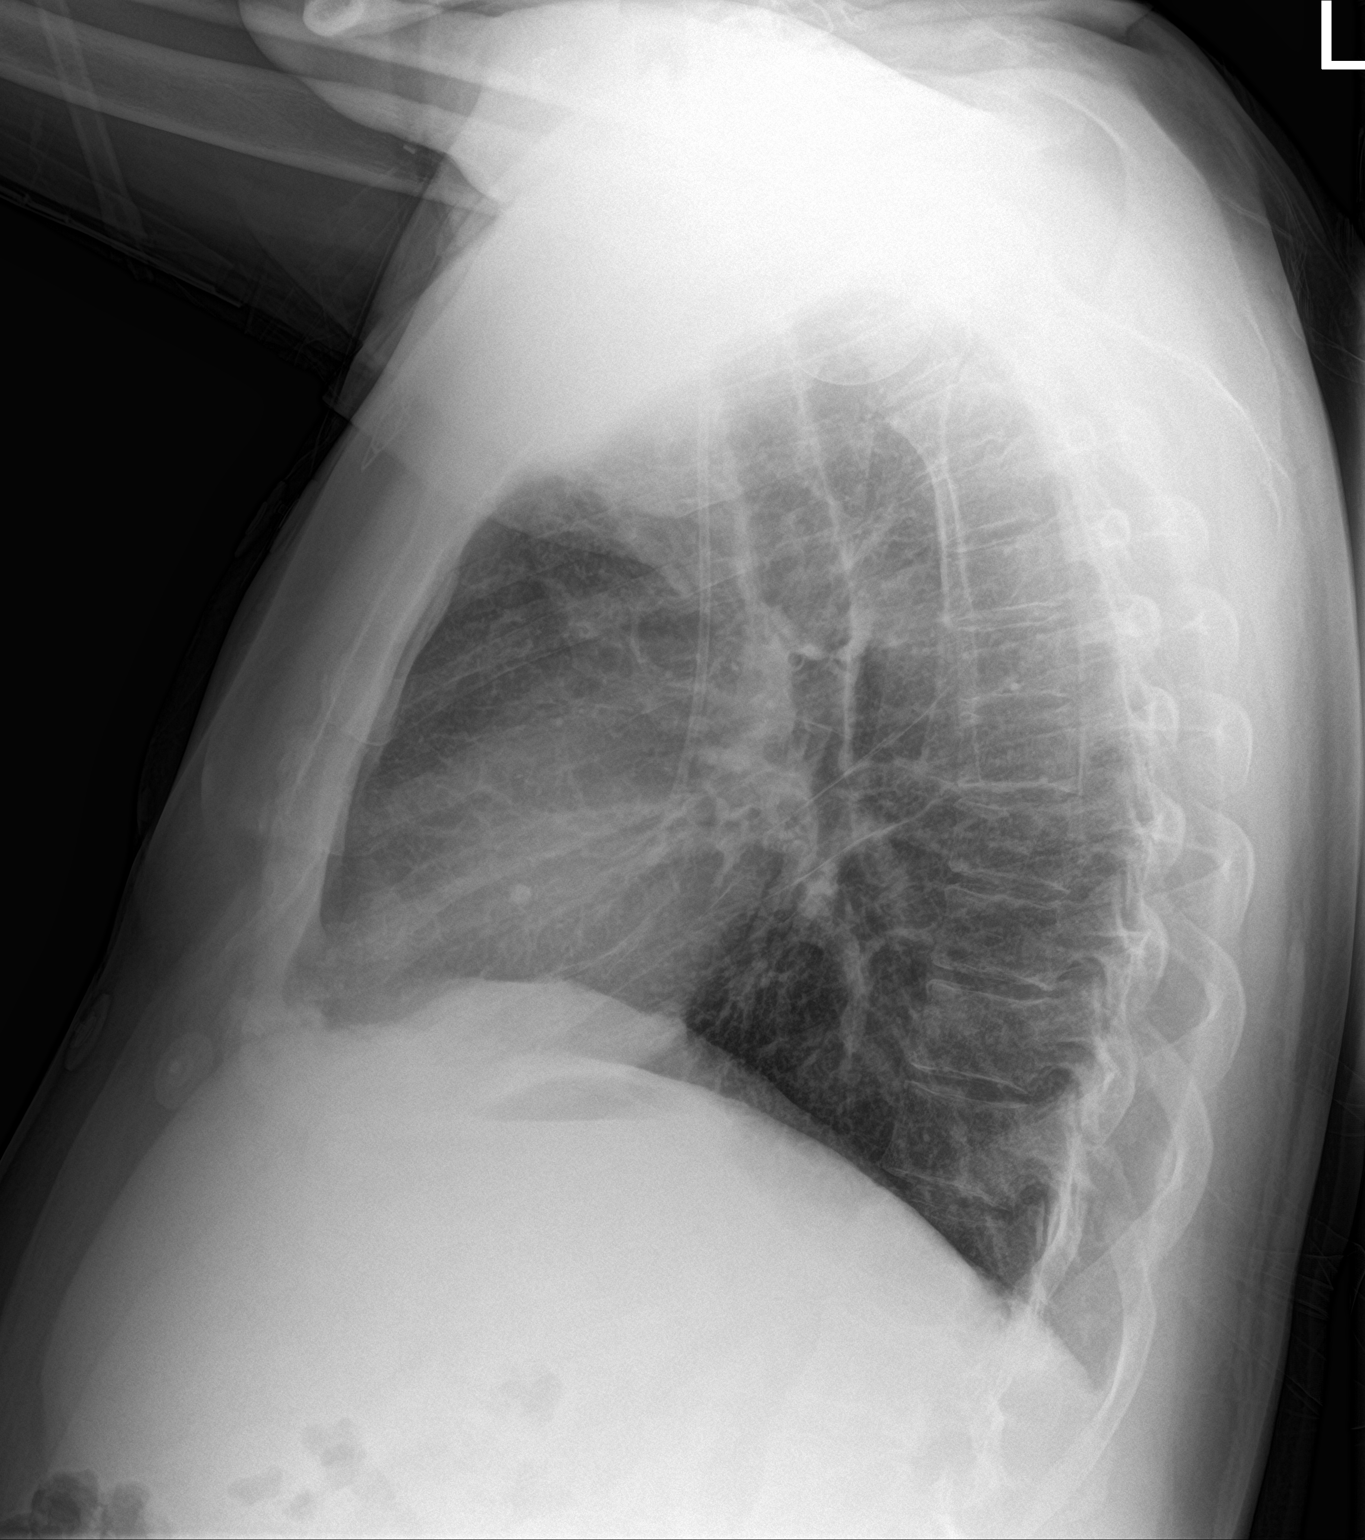

[2 of 2 positions shown; findings below may reference images not displayed]

FINDINGS: Right chest port with tip in the lower SVC.The cardiomediastinal
contours are normal. Calcified granuloma in the right lung.
Pulmonary vasculature is normal. No consolidation, pleural effusion,
or pneumothorax. No acute osseous abnormalities are seen.
IMPRESSION: No acute chest finding.

## 2019-08-21 MED ORDER — SODIUM CHLORIDE 0.9% FLUSH: 3.0000 mL | Freq: Once | INTRAVENOUS | Status: DC

## 2019-08-21 NOTE — ED Notes (Signed)
Called for room x3. No answer.

## 2019-08-21 NOTE — ED Triage Notes (Signed)
Pt states he forgot to take his medication this am so he took morning meds @ 1400 then night meds at 2000. Pt reports feeling like his sugar was dropping and weak in his legs. About one hour ago. No n/v/d.

## 2019-08-21 NOTE — ED Notes (Signed)
Called pt for repeat troponin,  No answer.

## 2019-08-26 ENCOUNTER — Ambulatory Visit: Payer: Medicaid Other | Admitting: Family Medicine

## 2019-08-29 ENCOUNTER — Other Ambulatory Visit: Payer: Self-pay

## 2019-08-29 ENCOUNTER — Inpatient Hospital Stay: Payer: Medicaid Other | Attending: Hematology

## 2019-08-29 ENCOUNTER — Inpatient Hospital Stay: Payer: Medicaid Other

## 2019-08-29 DIAGNOSIS — E1142 Type 2 diabetes mellitus with diabetic polyneuropathy: Secondary | ICD-10-CM | POA: Insufficient documentation

## 2019-08-29 DIAGNOSIS — Z9221 Personal history of antineoplastic chemotherapy: Secondary | ICD-10-CM | POA: Insufficient documentation

## 2019-08-29 DIAGNOSIS — Z8572 Personal history of non-Hodgkin lymphomas: Secondary | ICD-10-CM | POA: Insufficient documentation

## 2019-08-29 DIAGNOSIS — G4700 Insomnia, unspecified: Secondary | ICD-10-CM | POA: Insufficient documentation

## 2019-08-29 DIAGNOSIS — D500 Iron deficiency anemia secondary to blood loss (chronic): Secondary | ICD-10-CM

## 2019-08-29 DIAGNOSIS — I700 Atherosclerosis of aorta: Secondary | ICD-10-CM | POA: Insufficient documentation

## 2019-08-29 DIAGNOSIS — Z79899 Other long term (current) drug therapy: Secondary | ICD-10-CM | POA: Insufficient documentation

## 2019-08-29 DIAGNOSIS — I2510 Atherosclerotic heart disease of native coronary artery without angina pectoris: Secondary | ICD-10-CM | POA: Insufficient documentation

## 2019-08-29 DIAGNOSIS — Z95828 Presence of other vascular implants and grafts: Secondary | ICD-10-CM

## 2019-08-29 DIAGNOSIS — C182 Malignant neoplasm of ascending colon: Secondary | ICD-10-CM | POA: Insufficient documentation

## 2019-08-29 DIAGNOSIS — D696 Thrombocytopenia, unspecified: Secondary | ICD-10-CM | POA: Diagnosis not present

## 2019-08-29 DIAGNOSIS — E7800 Pure hypercholesterolemia, unspecified: Secondary | ICD-10-CM | POA: Diagnosis not present

## 2019-08-29 DIAGNOSIS — C786 Secondary malignant neoplasm of retroperitoneum and peritoneum: Secondary | ICD-10-CM | POA: Diagnosis not present

## 2019-08-29 LAB — CBC WITH DIFFERENTIAL (CANCER CENTER ONLY)
Abs Immature Granulocytes: 0.04 10*3/uL (ref 0.00–0.07)
Basophils Absolute: 0 10*3/uL (ref 0.0–0.1)
Basophils Relative: 0 %
Eosinophils Absolute: 0.4 10*3/uL (ref 0.0–0.5)
Eosinophils Relative: 5 %
HCT: 39.9 % (ref 39.0–52.0)
Hemoglobin: 13.7 g/dL (ref 13.0–17.0)
Immature Granulocytes: 0 %
Lymphocytes Relative: 21 %
Lymphs Abs: 1.9 10*3/uL (ref 0.7–4.0)
MCH: 30.6 pg (ref 26.0–34.0)
MCHC: 34.3 g/dL (ref 30.0–36.0)
MCV: 89.3 fL (ref 80.0–100.0)
Monocytes Absolute: 0.7 10*3/uL (ref 0.1–1.0)
Monocytes Relative: 8 %
Neutro Abs: 5.9 10*3/uL (ref 1.7–7.7)
Neutrophils Relative %: 66 %
Platelet Count: 184 10*3/uL (ref 150–400)
RBC: 4.47 MIL/uL (ref 4.22–5.81)
RDW: 13 % (ref 11.5–15.5)
WBC Count: 8.9 10*3/uL (ref 4.0–10.5)
nRBC: 0 % (ref 0.0–0.2)

## 2019-08-29 LAB — CEA (IN HOUSE-CHCC): CEA (CHCC-In House): 4.88 ng/mL (ref 0.00–5.00)

## 2019-08-29 LAB — CMP (CANCER CENTER ONLY)
ALT: 19 U/L (ref 0–44)
AST: 17 U/L (ref 15–41)
Albumin: 3.6 g/dL (ref 3.5–5.0)
Alkaline Phosphatase: 99 U/L (ref 38–126)
Anion gap: 7 (ref 5–15)
BUN: 23 mg/dL — ABNORMAL HIGH (ref 6–20)
CO2: 22 mmol/L (ref 22–32)
Calcium: 9.2 mg/dL (ref 8.9–10.3)
Chloride: 105 mmol/L (ref 98–111)
Creatinine: 0.84 mg/dL (ref 0.61–1.24)
GFR, Est AFR Am: >60 mL/min (ref >60)
GFR, Estimated: >60 mL/min (ref >60)
Glucose, Bld: 282 mg/dL — ABNORMAL HIGH (ref 70–99)
Potassium: 4.7 mmol/L (ref 3.5–5.1)
Sodium: 134 mmol/L — ABNORMAL LOW (ref 135–145)
Total Bilirubin: 0.6 mg/dL (ref 0.3–1.2)
Total Protein: 6.1 g/dL — ABNORMAL LOW (ref 6.5–8.1)

## 2019-08-29 LAB — IRON AND TIBC
Iron: 122 ug/dL (ref 42–163)
Saturation Ratios: 35 % (ref 20–55)
TIBC: 349 ug/dL (ref 202–409)
UIBC: 227 ug/dL (ref 117–376)

## 2019-08-29 LAB — FERRITIN: Ferritin: 133 ng/mL (ref 24–336)

## 2019-08-29 MED ORDER — SODIUM CHLORIDE 0.9% FLUSH
10.0000 mL | INTRAVENOUS | Status: DC | PRN
  Administered 2019-08-29: 08:00:00 10 mL
  Filled 2019-08-29: qty 10

## 2019-08-29 MED ORDER — HEPARIN SOD (PORK) LOCK FLUSH 100 UNIT/ML IV SOLN
500.0000 [IU] | Freq: Once | INTRAVENOUS | Status: AC | PRN
  Administered 2019-08-29: 08:00:00 500 [IU]
  Filled 2019-08-29: qty 5

## 2019-08-30 ENCOUNTER — Telehealth: Payer: Self-pay

## 2019-08-30 ENCOUNTER — Other Ambulatory Visit: Payer: Self-pay

## 2019-08-30 DIAGNOSIS — C182 Malignant neoplasm of ascending colon: Secondary | ICD-10-CM

## 2019-08-30 DIAGNOSIS — Z91041 Radiographic dye allergy status: Secondary | ICD-10-CM

## 2019-08-30 MED ORDER — DIPHENHYDRAMINE HCL 50 MG PO TABS: 50.0000 mg | ORAL_TABLET | ORAL | 0 refills | Status: DC

## 2019-08-30 MED ORDER — PREDNISONE 50 MG PO TABS: ORAL_TABLET | ORAL | 0 refills | Status: DC

## 2019-08-30 MED FILL — predniSONE 50 MG TABS: 50 | 3 days supply | Qty: 3 | Fill #0

## 2019-08-30 NOTE — Telephone Encounter (Signed)
With the help of [place] interpreter, able to get patient scheduled for his CT of abdomen/pelvis with contrast for this coming Friday 09/02/19 at 07:30. Patient is allergic to the contrast media so a prescription for Prednisone 50mg  PO x3 tablets, and Benadryl 50mg  x1 tablet was sent to [facility]. Almyra Free gave the patient detailed instructions to take the 1st Prednisone tablet 13 hrs before scan, 2nd tablet 7 hours before scan, and 3rd tablet along with Benadryl 1 hour before scan; she also told patient that 1st bottle of oral contrast will need to be ingested 2hrs before scan, and 2nd bottle 1 hour before scan. Per Almyra Free, patient verbalized understanding and agreement. Scheduling message sent to move patient's follow up with Dr. Burr Medico to Monday 09/05/19.

## 2019-08-31 NOTE — Progress Notes (Signed)
Blake Vaughn   [phone] [phone]   Clinic Follow up Note   Patient Care Team: [name], [name], DO as PCP - General [name], MD as Consulting Physician (Colon and Rectal Surgery) [name], MD as Consulting Physician (Hematology)  Date of Service:  09/03/2019  CHIEF COMPLAINT:  F/u on colon cancer  SUMMARY OF ONCOLOGIC HISTORY: Oncology History Overview Note  Cancer Staging Cancer of right colon [facility]) Staging form: Colon and Rectum, AJCC 8th Edition - Pathologic stage from 10/21/2018: Stage IVC (pT4b, pN1a, pM1c) - Signed by [name], MD on 19/04/931  Follicular lymphoma ([place]), History of Staging form: Lymphoid Neoplasms, AJCC 6th Edition - Clinical: Stage II - Signed by Curt Bears, MD on 6/71/2458     Follicular lymphoma [facility]), History of  11/2009 Initial Diagnosis   Follicular lymphoma ([place]), History of    Chemotherapy   1) Status post 6 cycles of systemic chemotherapy with CHOP/Rituxan last dose given 05/01/2009.  2) Maintenance Rituxan at 375 mg per meter square given every 2 months status post 12 cycles    Cancer of right colon ([place])  10/21/2018 Surgery   Exploratory laparotomy right hemicolectomy by Dr. [name] and Dr. [name]  10/21/18   10/21/2018 Pathology Results   Diagnosis 10/21/18 Colon, segmental resection for tumor, right ascending and appendix - ADENOCARCINOMA, MODERATE TO POORLY DIFFERENTIATED (4 CM) - METASTATIC CARCINOMA INVOLVING ONE OF EIGHTEEN LYMPH NODES (1/18) - CARCINOMA EXTENDS INTO THE APPENDIX - TWO TUMOR DEPOSITS PRESENT - SEE ONCOLOGY TABLE AND COMMENT BELOW   10/21/2018 Cancer Staging   Staging form: Colon and Rectum, AJCC 8th Edition - Pathologic stage from 10/21/2018: Stage IVC (pT4b, pN1a, pM1c) - Signed by [name], MD on 11/13/2018   11/04/2018 Imaging   CT AP W Contrast 11/04/18  IMPRESSION: 1. Interval appendectomy. There is residual soft tissue thickening and  stranding in the right lower quadrant adjacent to the cecum which may represent ongoing inflammation versus postsurgical changes. A small soft tissue density adjacent to the surgical sutures may reflect small hematoma or operative collection. No large focal fluid collection to suggest drainable abscess allowing for absence of contrast 2. Fluid-filled colon without wall thickening, could reflect diarrheal process   11/08/2018 Imaging   CT Chest W Contrast 11/08/18  IMPRESSION: 1. No acute findings are noted in the thorax to account for the patient's symptoms. 2. Aortic atherosclerosis, in addition to left main and 3 vessel coronary artery disease. Please note that although the presence of coronary artery calcium documents the presence of coronary artery disease, the severity of this disease and any potential stenosis cannot be assessed on this non-gated CT examination. Assessment for potential risk factor modification, dietary therapy or pharmacologic therapy may be warranted, if clinically indicated. 3. Additional incidental findings, as above. Aortic Atherosclerosis (ICD10-I70.0).   11/12/2018 Initial Diagnosis   Cancer of right colon ([place])   11/25/2018 -  Chemotherapy   FOLFOX q2weeks starting 11/25/18. Due to moderate thrombocytopenia, I will stop 5-FU bolus, and reduce pump infusion to 2247m/m2 starting with cycle 7.  Plan for last treatment on 05/05/19.    02/10/2019 Imaging   CT AP W Contrast  IMPRESSION: 1. No evidence metastatic disease. 2. Hepatic steatosis. 3. Mildly enlarged prostate. 4. Aortic atherosclerosis (ICD10-170.0). coronary artery calcification.   09/02/2019 Imaging   CT AP W Contrast IMPRESSION: 1. No signs to suggest metastatic disease in the abdomen or pelvis. 2. Aortic atherosclerosis these, as well as right coronary artery disease.  CURRENT THERAPY:  Surveillance   INTERVAL HISTORY:  Blake Vaughn is here for a follow up of colon  cancer. 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 He notes he is doing well. He notes the clinic has his correct phone number. He notes numbness of feet and hands with mild tingling. He notes he will drop some things occasionally. He notes he walks often but does eat [employer]. He does monitor his BG at home.    REVIEW OF SYSTEMS:   Constitutional: Denies fevers, chills or abnormal weight loss Eyes: Denies blurriness of vision Ears, nose, mouth, throat, and face: Denies mucositis or sore throat Respiratory: Denies cough, dyspnea or wheezes Cardiovascular: Denies palpitation, chest discomfort or lower extremity swelling Gastrointestinal:  Denies nausea, heartburn or change in bowel habits Skin: Denies abnormal skin rashes Lymphatics: Denies new lymphadenopathy or easy bruising Neurological: (+) Residual Neuropathy of hands and feet Behavioral/Psych: Mood is stable, no new changes  All other systems were reviewed with the patient and are negative.  MEDICAL HISTORY:  Past Medical History:  Diagnosis Date  . Appendicitis 10/21/2018  . Diabetes mellitus   . Follicular lymphoma ([place]), 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   Qualifier: Diagnosis of  By: [name], [name]   . GI bleed 11/05/2018  . Hypercholesterolemia   . Hypertension 11/27/2011  . Lymphoma [facility])    gets annual chemo last tx Feb 2013  . TOBACCO USE, QUIT 08/18/2009   Qualifier: Diagnosis of  By: Drue Flirt  MD, Merrily Brittle      SURGICAL HISTORY: Past Surgical History:  Procedure Laterality Date  . ESOPHAGOGASTRODUODENOSCOPY (EGD) WITH PROPOFOL N/A 11/05/2018   Procedure: ESOPHAGOGASTRODUODENOSCOPY (EGD) WITH PROPOFOL;  Surgeon: [name], MD;  Location: [place];  Service: Endoscopy;  Laterality: N/A;  . IR IMAGING GUIDED PORT INSERTION  11/24/2018  . LAPAROSCOPIC APPENDECTOMY N/A 10/21/2018   Procedure: Exploratory laparotomy right hemicolectomy;  Surgeon: [name], MD;  Location: [place];  Service: General;   Laterality: N/A;    I have reviewed the social history and family history with the patient and they are unchanged from previous note.  ALLERGIES:  is allergic to iohexol.  MEDICATIONS:  Current Outpatient Medications  Medication Sig Dispense Refill  . Accu-Chek Softclix Lancets lancets Use as instructed 100 each 12  . diphenhydrAMINE (BENADRYL) 50 MG tablet Take 1 tablet (50 mg total) by mouth as directed. Take Benadryl 50 mg   1 hr  Before  CT scans. 1 tablet 0  . glipiZIDE (GLUCOTROL XL) 5 MG 24 hr tablet Take 1 tablet (5 mg total) by mouth 2 (two) times daily. 90 tablet 1  . glucose blood (ACCU-CHEK AVIVA PLUS) test strip Use as instructed 100 each 12  . lidocaine-prilocaine (EMLA) cream Apply to affected area once 30 g 3  . metFORMIN (GLUCOPHAGE) 1000 MG tablet Take 1 tablet (1,000 mg total) by mouth 2 (two) times daily with a meal. 180 tablet 1  . predniSONE (DELTASONE) 50 MG tablet Take Prednisone 50 mg  At   13 hr,  7 hr,  And  1 hr  Before  CT  Scans. 3 tablet 0  . predniSONE (DELTASONE) 50 MG tablet Take first tablet 13 hours before CT scan, second tablet 7 hours before CT scan, and third tablet 1 hour before CT scan 3 tablet 0  . prochlorperazine (COMPAZINE) 10 MG tablet Take 1 tablet (10 mg total) by mouth every 6 (six) hours as needed (Nausea or vomiting). 30 tablet 2  .  sitaGLIPtin (JANUVIA) 50 MG tablet Take 1 tablet (50 mg total) by mouth daily. 90 tablet 3  . gabapentin (NEURONTIN) 300 MG capsule Take 1 capsule (300 mg total) by mouth 3 (three) times daily. 90 capsule 1   No current facility-administered medications for this visit.     PHYSICAL EXAMINATION: ECOG PERFORMANCE STATUS: 0 - Asymptomatic  Vitals:   09/02/19 1423  BP: (!) 172/77  Pulse: 100  Resp: 18  Temp: 98 F (36.7 C)  SpO2: 96%   Filed Weights   09/02/19 1423  Weight: 190 lb 11.2 oz (86.5 kg)   GENERAL:alert, no distress and comfortable SKIN: skin color, texture, turgor are normal, no rashes  or significant lesions EYES: normal, Conjunctiva are pink and non-injected, sclera clear NECK: supple, thyroid normal size, non-tender, without nodularity LYMPH:  no palpable lymphadenopathy in the cervical, axillary  LUNGS: clear to auscultation and percussion with normal breathing effort HEART: regular rate & rhythm and no murmurs and no lower extremity edema ABDOMEN:abdomen soft, non-tender and normal bowel sounds Musculoskeletal:no cyanosis of digits and no clubbing  NEURO: alert & oriented x 3 with fluent speech, no focal motor/sensory deficits  LABORATORY DATA:  I have reviewed the data as listed CBC Latest Ref Rng & Units 08/29/2019 08/21/2019 06/02/2019  WBC 4.0 - 10.5 K/uL 8.9 8.7 7.5  Hemoglobin 13.0 - 17.0 g/dL 13.7 13.8 14.8  Hematocrit 39.0 - 52.0 % 39.9 39.1 44.5  Platelets 150 - 400 K/uL 184 188 100(L)     CMP Latest Ref Rng & Units 08/29/2019 08/21/2019 06/02/2019  Glucose 70 - 99 mg/dL 282(H) 271(H) 352(H)  BUN 6 - 20 mg/dL 23(H) 19 17  Creatinine 0.61 - 1.24 mg/dL 0.84 0.93 1.03  Sodium 135 - 145 mmol/L 134(L) 135 134(L)  Potassium 3.5 - 5.1 mmol/L 4.7 3.9 4.3  Chloride 98 - 111 mmol/L 105 104 103  CO2 22 - 32 mmol/L 22 19(L) 23  Calcium 8.9 - 10.3 mg/dL 9.2 9.1 8.7(L)  Total Protein 6.5 - 8.1 g/dL 6.1(L) - 6.5  Total Bilirubin 0.3 - 1.2 mg/dL 0.6 - 0.4  Alkaline Phos 38 - 126 U/L 99 - 168(H)  AST 15 - 41 U/L 17 - 26  ALT 0 - 44 U/L 19 - 26      RADIOGRAPHIC STUDIES: I have personally reviewed the radiological images as listed and agreed with the findings in the report. Ct Abdomen Pelvis W Contrast  Result Date: 09/02/2019 CLINICAL DATA:  58 year old male with history of stage IV colon cancer. Follow-up study. EXAM: CT ABDOMEN AND PELVIS WITH CONTRAST TECHNIQUE: Multidetector CT imaging of the abdomen and pelvis was performed using the standard protocol following bolus administration of intravenous contrast. CONTRAST:  11m OMNIPAQUE IOHEXOL 300 MG/ML  SOLN  COMPARISON:  CT the abdomen and pelvis 02/10/2019. FINDINGS: Lower chest: Atherosclerotic calcification in the distal right coronary artery. Calcified granuloma in the right middle lobe. Central venous catheter tip terminating in the right atrium. Hepatobiliary: No suspicious cystic or solid hepatic lesions. No intra or extrahepatic biliary ductal dilatation. Gallbladder is normal in appearance. Pancreas: No pancreatic mass. No pancreatic ductal dilatation. No pancreatic or peripancreatic fluid collections or inflammatory changes. Spleen: Unremarkable. Adrenals/Urinary Tract: Bilateral kidneys and adrenal glands are normal in appearance. No hydroureteronephrosis. Urinary bladder is normal in appearance. Stomach/Bowel: Normal appearance of the stomach. No pathologic dilatation of small bowel or colon. Status post appendectomy. Vascular/Lymphatic: Aortic atherosclerosis, without evidence of aneurysm or dissection in the abdominal or pelvic vasculature. Retroaortic left  renal vein (normal anatomical variant) incidentally noted. No lymphadenopathy identified in the abdomen or pelvis. Reproductive: Prostate gland and seminal vesicles are unremarkable in appearance. Other: No significant volume of ascites.  No pneumoperitoneum. Musculoskeletal: There are no aggressive appearing lytic or blastic lesions noted in the visualized portions of the skeleton. IMPRESSION: 1. No signs to suggest metastatic disease in the abdomen or pelvis. 2. Aortic atherosclerosis these, as well as right coronary artery disease. Electronically Signed   By: Vinnie Langton M.D.   On: 09/02/2019 08:47     ASSESSMENT & PLAN:  Blake Vaughn is a 58 y.o. male with   1.RightCecal adenocarcinoma,pT4bN1aM1c,Stage IVC,with limited peritoneal metastasis,resected,Grade II-III, MMR normal -Diagnosed in 10/2018. Treated with right hemicolectomy.He was found to have 2 smallperitoneal metastasis during the surgery, which were  removed. -He completed 12 cycles of adjuvant FOLFOX.  --Given high risk of recurrence, I recommend he keep his PAC in place for 1-2 years. Will continue port flush every 6-8 weeks -He has recovered well from chemo except mild neuropathy. He has perisstent neuropathy in his hands but resolved in feet. I discussed this will take time to recover.  -I personally reviewed and discussed his CT AP from today which shows no sign of metastatic disease in abdomen and pelvis.  -Labs reviewed from this week, CBC and CMP WNL except BG 282, BUN 23, protein 6.1. CEA and iron panel normal.  -He is recovering well from chemo, he still has residual neuropathy of hands and feet. I discussed this will take time to completely recover.  -Plan to rescan him in 6 months.  -f/u in 3 months   2. H/o Follicular Lymphoma -Was treated with CHOP/Rituxan in 2010.Then he waspreviouslytreated withMaintenance Rituxancompleted in 2012.Last f/u was in 2017.  3. DM and Hypercholesterolemia, uncontrolled hyperglycemia -On metformin,Glimepiride,Lipitor and metoprolol -Iencouraged him to continue to check his BG at home. -BG at 282 today (09/02/19). I strongly encouraged him to better control his DM and follow up with his PCP about medication dose adjustment.  -His CT AP from 09/02/19 also shows aortic atherosclerosis, which can increase his risk for MI.  -I also encouraged him to watch his weight with healthier diet with less carbohydrates and more exercise.    4. Financial Support -Medicaid pending -His wife is in [place]. Has 2 children andabrother that help his at home  5. Insomnia -He is currently taking Benadryl as needed which helps  6.Thrombocytopenia -secondary tochemotherapy, so I reduced chemo dose -I will monitor. Currently resolved with plt 184K today (09/02/19).  7. 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 I  encouraged him to increase to 2087mat night and he can titrate up to 30037mt night if needed.  -Although he has completed chemo he still has residual neuropathy in hands and feet. He drops small objects occasionally.  -I discussed if DM is not well controlled this can prolong or worsen his neuropathy.  -I again recommend he use OTC B12 or B complex.    Plan -port flush in 6 weeks -Lab and f/u in 3 months    No problem-specific Assessment & Plan notes found for this encounter.   No orders of the defined types were placed in this encounter.  All questions were answered. The patient knows to call the clinic with any problems, questions or concerns. No barriers to learning was detected. I spent 20 minutes counseling the patient face to face. The total time spent in  the appointment was 25 minutes and more than 50% was on counseling and review of test results     [name], MD 09/03/2019   I, Joslyn Devon, am acting as scribe for [name], MD.   I have reviewed the above documentation for accuracy and completeness, and I agree with the above.

## 2019-09-02 ENCOUNTER — Inpatient Hospital Stay: Payer: Medicaid Other | Admitting: Hematology

## 2019-09-02 ENCOUNTER — Ambulatory Visit (HOSPITAL_COMMUNITY)
Admission: RE | Admit: 2019-09-02 | Discharge: 2019-09-02 | Disposition: A | Discharge status: Home / Self Care | Payer: Medicaid Other | Source: Ambulatory Visit | Attending: Hematology | Admitting: Hematology

## 2019-09-02 ENCOUNTER — Inpatient Hospital Stay (HOSPITAL_BASED_OUTPATIENT_CLINIC_OR_DEPARTMENT_OTHER): Payer: Medicaid Other | Admitting: Hematology

## 2019-09-02 ENCOUNTER — Encounter: Payer: Self-pay | Admitting: Hematology

## 2019-09-02 ENCOUNTER — Other Ambulatory Visit: Payer: Self-pay

## 2019-09-02 VITALS — BP 172/77 | HR 100 | Temp 98.0°F | Resp 18 | Ht 60.0 in | Wt 190.7 lb

## 2019-09-02 DIAGNOSIS — C182 Malignant neoplasm of ascending colon: Secondary | ICD-10-CM

## 2019-09-02 DIAGNOSIS — Z85038 Personal history of other malignant neoplasm of large intestine: Secondary | ICD-10-CM | POA: Diagnosis not present

## 2019-09-02 IMAGING — CT CT ABD-PELV W/ CM
2 of 5 series · 16 of 46 positions shown, 18 images · IV contrast (OMNIPAQUE)
Comparison: CT the abdomen and pelvis [DATE].

CLINICAL DATA: 58-year-old male with history of stage IV colon
cancer. Follow-up study.

EXAM:
CT ABDOMEN AND PELVIS WITH CONTRAST
TECHNIQUE: Multidetector CT imaging of the abdomen and pelvis was performed
using the standard protocol following bolus administration of
intravenous contrast.
CONTRAST:  100mL OMNIPAQUE IOHEXOL 300 MG/ML  SOLN

[Series 2: axial st · axial · 0.79mm/px · z∈[-494,-119]mm · 13 of 89 slices shown, 15 images]
[im 7/89  soft-tissue]
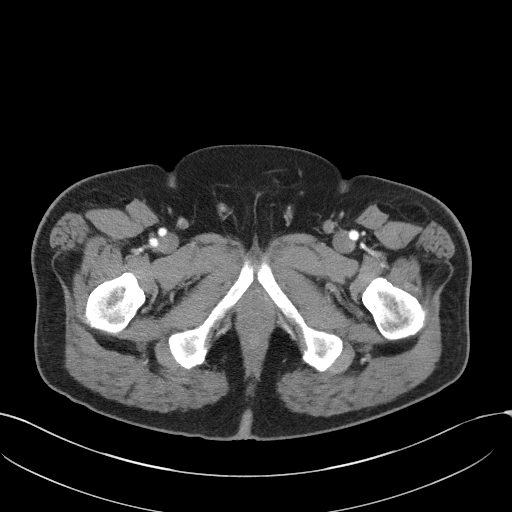
[im 7/89  bone]
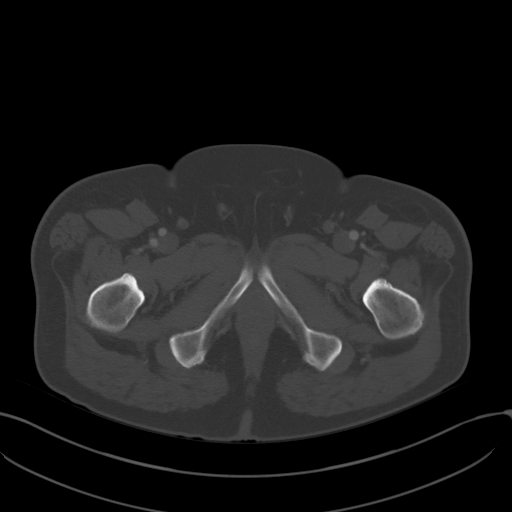
[im 13/89  soft-tissue]
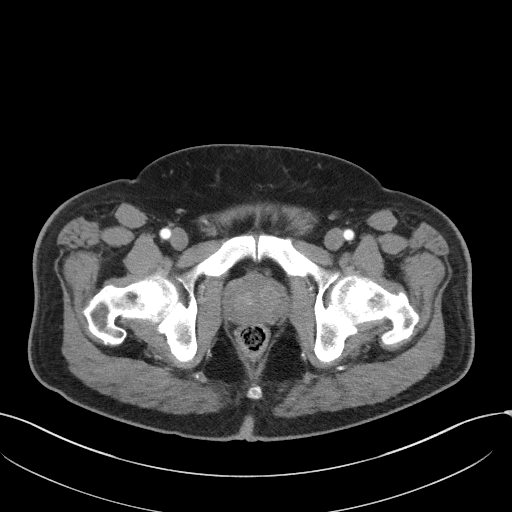
[im 19/89  soft-tissue]
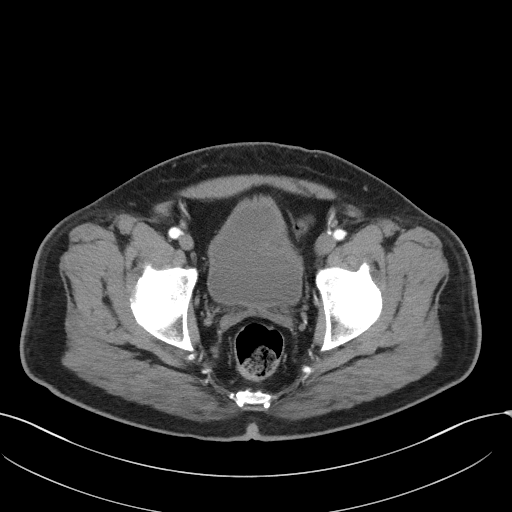
[im 26/89  soft-tissue]
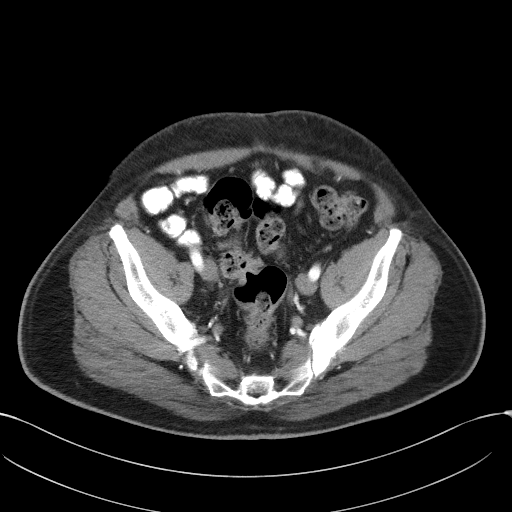
[im 32/89  soft-tissue]
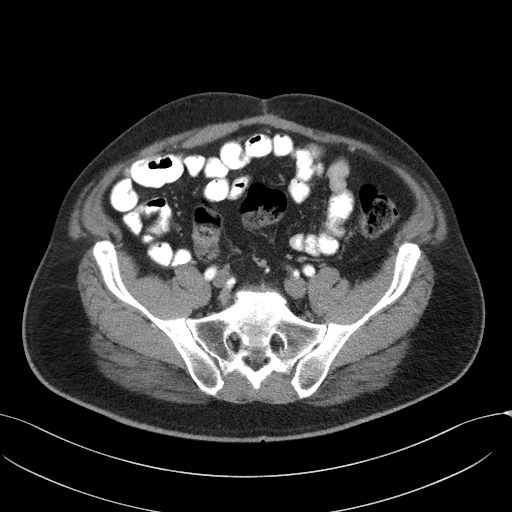
[im 38/89  soft-tissue]
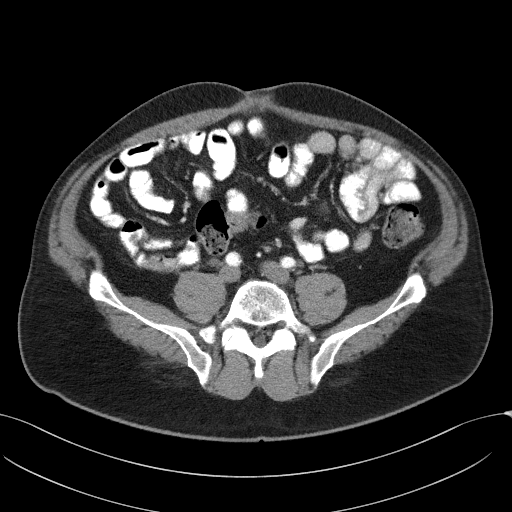
[im 45/89  soft-tissue]
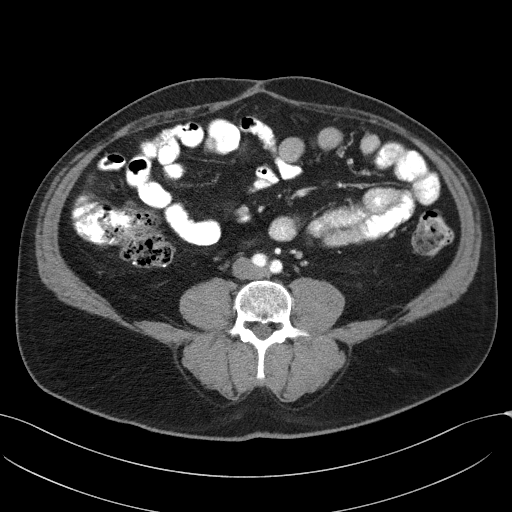
[im 51/89  soft-tissue]
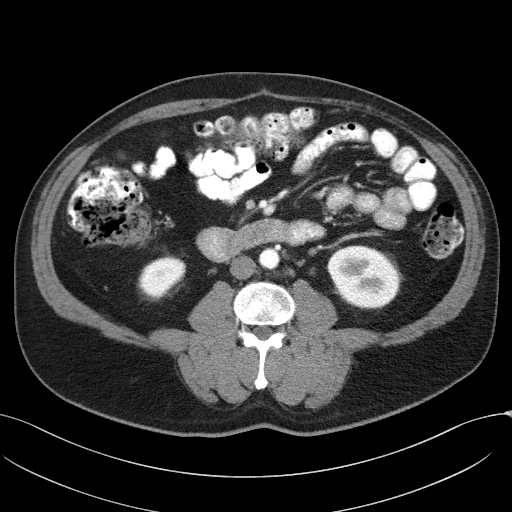
[im 57/89  soft-tissue]
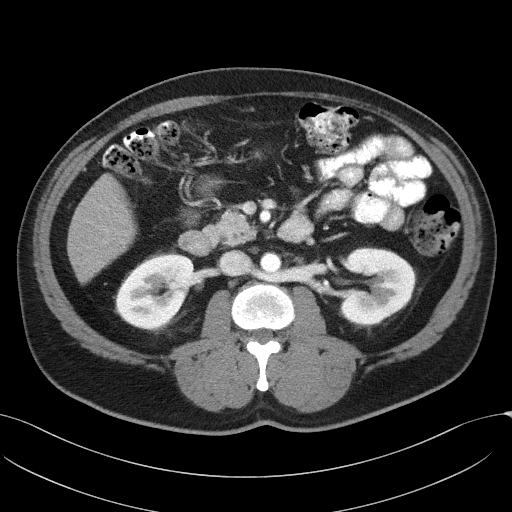
[im 57/89  bone]
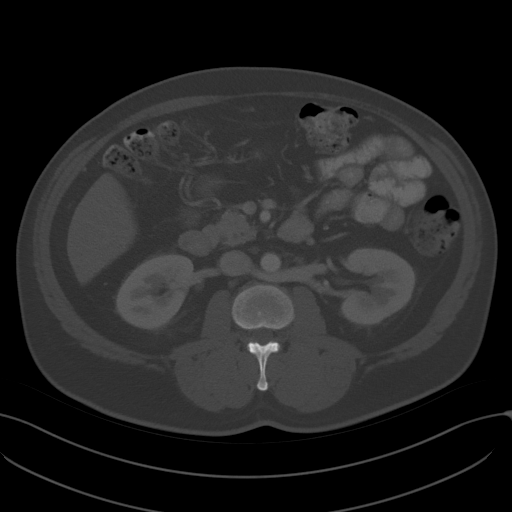
[im 63/89  soft-tissue]
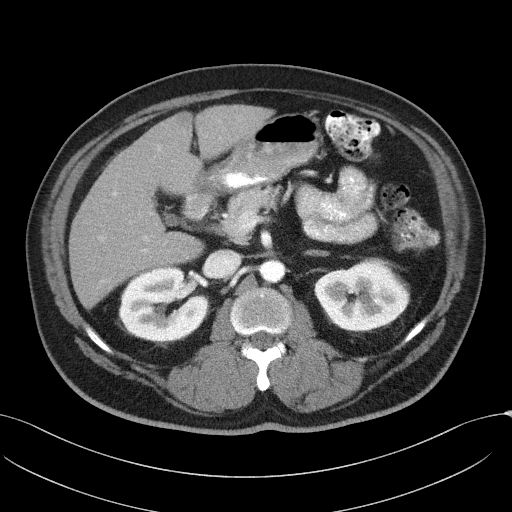
[im 70/89  soft-tissue]
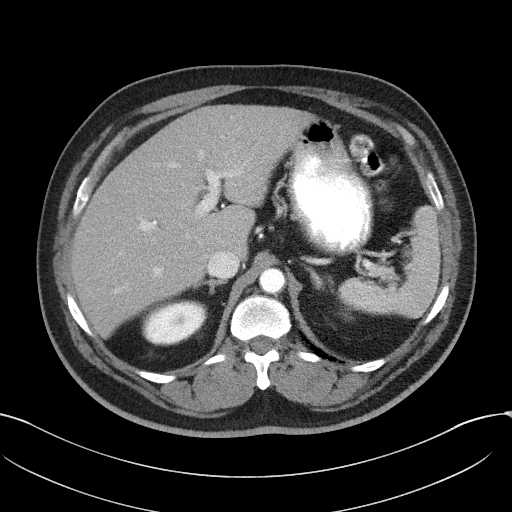
[im 76/89  soft-tissue]
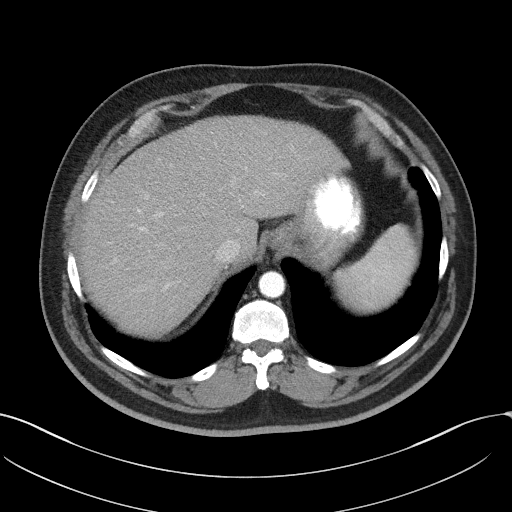
[im 82/89  soft-tissue]
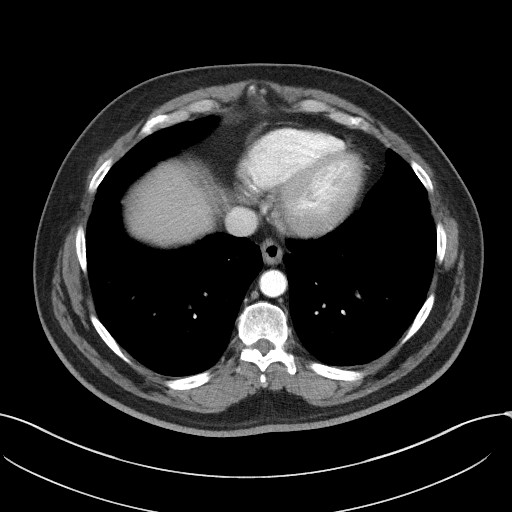

[Series 4: coronal st · coronal · 0.75mm/px · 3 of 104 slices shown]
[im 35/104  soft-tissue]
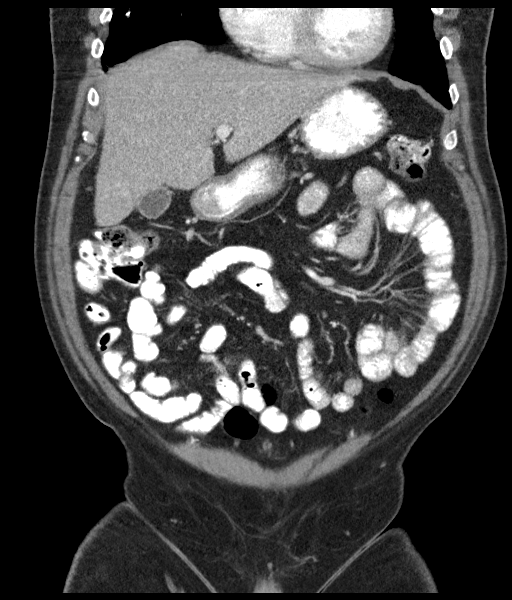
[im 46/104  soft-tissue]
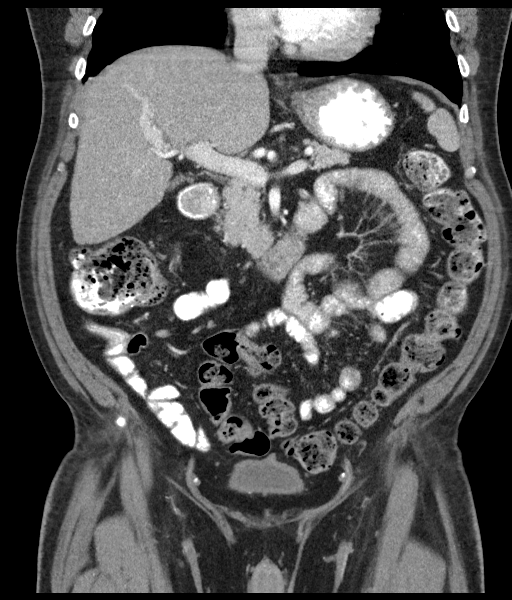
[im 58/104  soft-tissue]
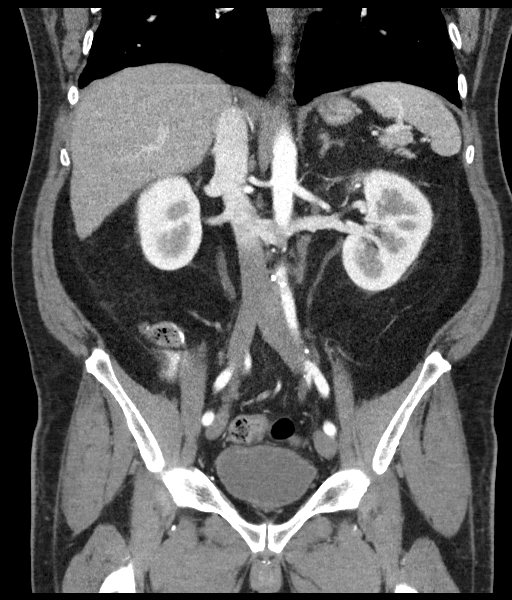

[16 of 46 positions shown; findings below may reference images not displayed]

FINDINGS: Lower chest: Atherosclerotic calcification in the distal right
coronary artery. Calcified granuloma in the right middle lobe.
Central venous catheter tip terminating in the right atrium.

Hepatobiliary: No suspicious cystic or solid hepatic lesions. No
intra or extrahepatic biliary ductal dilatation. Gallbladder is
normal in appearance.

Pancreas: No pancreatic mass. No pancreatic ductal dilatation. No
pancreatic or peripancreatic fluid collections or inflammatory
changes.

Spleen: Unremarkable.

Adrenals/Urinary Tract: Bilateral kidneys and adrenal glands are
normal in appearance. No hydroureteronephrosis. Urinary bladder is
normal in appearance.

Stomach/Bowel: Normal appearance of the stomach. No pathologic
dilatation of small bowel or colon. Status post appendectomy.

Vascular/Lymphatic: Aortic atherosclerosis, without evidence of
aneurysm or dissection in the abdominal or pelvic vasculature.
Retroaortic left renal vein (normal anatomical variant) incidentally
noted. No lymphadenopathy identified in the abdomen or pelvis.

Reproductive: Prostate gland and seminal vesicles are unremarkable
in appearance.

Other: No significant volume of ascites.  No pneumoperitoneum.

Musculoskeletal: There are no aggressive appearing lytic or blastic
lesions noted in the visualized portions of the skeleton.
IMPRESSION: 1. No signs to suggest metastatic disease in the abdomen or pelvis.
2. Aortic atherosclerosis these, as well as right coronary artery
disease.

## 2019-09-02 MED ORDER — SODIUM CHLORIDE (PF) 0.9 % IJ SOLN
INTRAMUSCULAR | Status: AC
  Filled 2019-09-02: qty 50

## 2019-09-02 MED ORDER — IOHEXOL 300 MG/ML  SOLN
100.0000 mL | Freq: Once | INTRAMUSCULAR | Status: AC | PRN
  Administered 2019-09-02: 100 mL via INTRAVENOUS

## 2019-09-03 ENCOUNTER — Encounter: Payer: Self-pay | Admitting: Hematology

## 2019-09-05 ENCOUNTER — Telehealth: Payer: Self-pay | Admitting: Hematology

## 2019-09-05 NOTE — Telephone Encounter (Signed)
Scheduled appt per 9/25 los.  Sent a message and a calendar will be mailed out.

## 2019-10-09 ENCOUNTER — Ambulatory Visit (HOSPITAL_COMMUNITY)
Admission: EM | Admit: 2019-10-09 | Discharge: 2019-10-09 | Disposition: A | Discharge status: Home / Self Care | Payer: Medicaid Other | Attending: Physician Assistant | Admitting: Physician Assistant

## 2019-10-09 ENCOUNTER — Encounter (HOSPITAL_COMMUNITY): Payer: Self-pay

## 2019-10-09 ENCOUNTER — Other Ambulatory Visit: Payer: Self-pay

## 2019-10-09 DIAGNOSIS — R519 Headache, unspecified: Secondary | ICD-10-CM

## 2019-10-09 LAB — GLUCOSE, CAPILLARY: Glucose-Capillary: 90 mg/dL (ref 70–99)

## 2019-10-09 MED ORDER — TRAMADOL HCL 50 MG PO TABS: 50.0000 mg | ORAL_TABLET | Freq: Three times a day (TID) | ORAL | 0 refills | Status: DC | PRN

## 2019-10-09 NOTE — ED Provider Notes (Signed)
[place]    CSN: [id number] Arrival date & time: 10/09/19  1311      History   Chief Complaint Chief Complaint  Patient presents with  . Headache    HPI Blake Vaughn is a 58 y.o. male.   Patient is 58 yo Spanish speaking male.  Spanish interpreter used. PMH include HTN, DM w/ neuopathy, HLD, colon cancer with active chemo.  He has history of GI bleed in 2019,  He is here concerned with HA x 4 days.  HA located posterior head and does not radiate.  He denies injury, fall, or trauma.  Patient notes he has numbness / tingling of b/l hands and feet.  He notes numbness / tingling is at baseline since starting chemo.  Denies vision changes, weakness, dizziness, CP, LE edema, SOB.  He has been taking tylenol at home without relief.     Past Medical History:  Diagnosis Date  . Appendicitis 10/21/2018  . Diabetes mellitus   . 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 GI bleed 11/05/2018  . Hypercholesterolemia   . Hypertension 11/27/2011  . Lymphoma [facility])    gets annual chemo last tx Feb 2013  . TOBACCO USE, QUIT 08/18/2009   Qualifier: Diagnosis of  By: Drue Flirt  MD, Taineisha      Patient Active Problem List   Diagnosis Date Noted  . Right shoulder pain 08/16/2019  . Bilateral shoulder pain 08/02/2019  . Neuropathy 07/30/2019  . Port-A-Cath in place 11/25/2018  . Cancer of right colon ([place]) 11/12/2018  . Syncope due to orthostatic hypotension 11/05/2018  . Hypoalbuminemia 11/05/2018  . Hypoproteinemia ([place]) 11/05/2018  . Uses Spanish as primary spoken language 10/23/2018  . Obesity (BMI 30-39.9) 10/23/2018  . Mass of cecum s/p right colectomy 10/21/2018 10/22/2018  . Nocturnal leg cramps 07/16/2017  . HTN (hypertension) 11/27/2011  . Pure hypercholesterolemia 09/24/2009  . 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 Diabetes type 2, uncontrolled ([place]) 07/09/2009  . DIABETIC CATARACT  07/09/2009  . 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 ESOPHAGOGASTRODUODENOSCOPY (EGD) WITH PROPOFOL N/A 11/05/2018   Procedure: ESOPHAGOGASTRODUODENOSCOPY (EGD) WITH PROPOFOL;  Surgeon: [name], MD;  Location: [place];  Service: Endoscopy;  Laterality: N/A;  . IR IMAGING GUIDED PORT INSERTION  11/24/2018  . LAPAROSCOPIC APPENDECTOMY N/A 10/21/2018   Procedure: Exploratory laparotomy right hemicolectomy;  Surgeon: [name], MD;  Location: [place];  Service: General;  Laterality: N/A;       Home Medications    Prior to Admission medications   Medication Sig Start Date End Date Taking? Authorizing Provider  Accu-Chek Softclix Lancets lancets Use as instructed 07/30/19   [name], [name], DO  diphenhydrAMINE (BENADRYL) 50 MG tablet Take 1 tablet (50 mg total) by mouth as directed. Take Benadryl 50 mg   1 hr  Before  CT scans. 08/30/19   [name], MD  gabapentin (NEURONTIN) 300 MG capsule Take 1 capsule (300 mg total) by mouth 3 (three) times daily. 07/25/19 08/24/19  [name], [name], DO  glipiZIDE (GLUCOTROL XL) 5 MG 24 hr tablet Take 1 tablet (5 mg total) by mouth 2 (two) times daily. 07/25/19   [name], [name], DO  glucose blood (ACCU-CHEK AVIVA PLUS) test strip Use as instructed 07/30/19   [name], [name], DO  lidocaine-prilocaine (EMLA) cream Apply to affected area once 11/25/18   [name], MD  metFORMIN (GLUCOPHAGE) 1000 MG tablet Take 1 tablet (1,000 mg total) by mouth 2 (two) times daily with a meal. 07/25/19 10/23/19  [name], [name], DO  predniSONE (DELTASONE) 50 MG tablet Take Prednisone 50 mg  At   13 hr,  7 hr,  And  1 hr  Before  CT  Scans. 06/02/19   [name], MD  predniSONE (DELTASONE) 50 MG tablet Take first tablet 13 hours before CT scan, second tablet 7 hours before CT scan, and third tablet 1 hour before CT scan 08/30/19   [name], MD  prochlorperazine (COMPAZINE) 10 MG tablet Take 1 tablet  (10 mg total) by mouth every 6 (six) hours as needed (Nausea or vomiting). 04/21/19   [name], MD  sitaGLIPtin (JANUVIA) 50 MG tablet Take 1 tablet (50 mg total) by mouth daily. 07/25/19   [name], [name], DO  traMADol (ULTRAM) 50 MG tablet Take 1 tablet (50 mg total) by mouth every 8 (eight) hours as needed. 10/09/19   Peri Jefferson, [name]    Family History Family History  Problem Relation Age of Onset  . Diabetes Mother   . Diabetes Father   . Renal Disease Brother   . Diabetes Brother   . Cancer Brother        lymphoma     Social History Social History   Tobacco Use  . Smoking status: Former Smoker    Packs/day: 0.50    Years: 30.00    Pack years: 15.00    Quit date: 12/08/2004    Years since quitting: 14.8  . Smokeless tobacco: Former Systems developer    Quit date: 11/20/2006  Substance Use Topics  . Alcohol use: No  . Drug use: No     Allergies   Iohexol   Review of Systems Review of Systems  Constitutional: Negative for activity change, chills and fever.  HENT: Negative for ear discharge, ear pain, postnasal drip, rhinorrhea, sinus pressure, sinus pain and sore throat.   Eyes: Negative for photophobia and visual disturbance.  Respiratory: Negative for cough, chest tightness, shortness of breath and wheezing.   Cardiovascular: Negative for chest pain and palpitations.  Gastrointestinal: Negative for abdominal pain, diarrhea, nausea and vomiting.  Musculoskeletal: Negative for arthralgias, back pain, neck pain and neck stiffness.  Skin: Negative for color change and rash.  Allergic/Immunologic: Positive for immunocompromised state.  Neurological: Positive for numbness and headaches. Negative for dizziness, seizures, syncope, facial asymmetry, speech difficulty, weakness and light-headedness.  Hematological: Negative for adenopathy. Does not bruise/bleed easily.  Psychiatric/Behavioral: Negative for decreased concentration and sleep disturbance.     Physical Exam Triage  Vital Signs ED Triage Vitals  Enc Vitals Group     BP 10/09/19 1334 (!) 151/72     Pulse Rate 10/09/19 1334 77     Resp 10/09/19 1334 18     Temp 10/09/19 1334 98.4 F (36.9 C)     Temp Source 10/09/19 1334 Oral     SpO2 10/09/19 1334 99 %     Weight 10/09/19 1336 180 lb (81.6 kg)     Height --      Head Circumference --      Peak Flow --      Pain Score 10/09/19 1335 5     Pain Loc --      Pain Edu? --      Excl. in [place]? --    No data found.  Updated Vital Signs BP (!) 151/72 (BP Location: Right Arm)   Pulse 77  Temp 98.4 F (36.9 C) (Oral)   Resp 18   Wt 180 lb (81.6 kg)   SpO2 99%   BMI 35.15 kg/m   Visual Acuity Right Eye Distance:   Left Eye Distance:   Bilateral Distance:    Right Eye Near:   Left Eye Near:    Bilateral Near:     Physical Exam Vitals signs and nursing note reviewed.  Constitutional:      Appearance: He is well-developed.  HENT:     Head: Normocephalic and atraumatic.  Eyes:     General: No scleral icterus.    Extraocular Movements: Extraocular movements intact.     Right eye: Normal extraocular motion.     Left eye: Normal extraocular motion.     Conjunctiva/sclera: Conjunctivae normal.  Neck:     Musculoskeletal: Normal range of motion and neck supple. No neck rigidity.  Cardiovascular:     Rate and Rhythm: Normal rate and regular rhythm.     Heart sounds: No murmur.  Pulmonary:     Effort: Pulmonary effort is normal. No respiratory distress.     Breath sounds: Normal breath sounds. No wheezing, rhonchi or rales.  Musculoskeletal: Normal range of motion.        General: No swelling or tenderness.  Lymphadenopathy:     Cervical: No cervical adenopathy.  Skin:    General: Skin is warm and dry.     Capillary Refill: Capillary refill takes less than 2 seconds.  Neurological:     Mental Status: He is alert and oriented to person, place, and time.     Cranial Nerves: No cranial nerve deficit or facial asymmetry.     Motor: No  weakness.     Coordination: Coordination normal.     Gait: Gait normal.     Deep Tendon Reflexes: Reflexes normal.  Psychiatric:        Mood and Affect: Mood normal.        Speech: Speech normal.        Behavior: Behavior normal.      UC Treatments / Results  Labs (all labs ordered are listed, but only abnormal results are displayed) Labs Reviewed  GLUCOSE, CAPILLARY  CBG MONITORING, ED    EKG   Radiology No results found.  Procedures Procedures (including critical care time)  Medications Ordered in UC Medications - No data to display  Initial Impression / Assessment and Plan / UC Course  I have reviewed the triage vital signs and the nursing notes.  Pertinent labs & imaging results that were available during my care of the patient were reviewed by me and considered in my medical decision making (see chart for details).      Final Clinical Impressions(s) / UC Diagnoses   Final diagnoses:  Acute intractable headache, unspecified headache type   Discharge Instructions   None    ED Prescriptions    Medication Sig Dispense Auth. Provider   traMADol (ULTRAM) 50 MG tablet Take 1 tablet (50 mg total) by mouth every 8 (eight) hours as needed. 9 tablet Peri Jefferson, [name]     I have reviewed the PDMP during this encounter.   Peri Jefferson, [name] 10/09/19 1502

## 2019-10-09 NOTE — Discharge Instructions (Addendum)
Follow up with PCP if no improvement. Go to ER with worsening symptoms.

## 2019-10-09 NOTE — ED Triage Notes (Signed)
Pt states he has a headaches. Pt states he hand s feel numb.

## 2019-10-10 ENCOUNTER — Emergency Department (HOSPITAL_COMMUNITY): Payer: Medicaid Other

## 2019-10-10 ENCOUNTER — Emergency Department (HOSPITAL_COMMUNITY)
Admission: EM | Admit: 2019-10-10 | Discharge: 2019-10-11 | Disposition: A | Discharge status: Home / Self Care | Payer: Medicaid Other | Attending: Emergency Medicine | Admitting: Emergency Medicine

## 2019-10-10 ENCOUNTER — Encounter (HOSPITAL_COMMUNITY): Payer: Self-pay

## 2019-10-10 ENCOUNTER — Other Ambulatory Visit: Payer: Self-pay

## 2019-10-10 DIAGNOSIS — I10 Essential (primary) hypertension: Secondary | ICD-10-CM | POA: Diagnosis not present

## 2019-10-10 DIAGNOSIS — R519 Headache, unspecified: Secondary | ICD-10-CM | POA: Diagnosis not present

## 2019-10-10 DIAGNOSIS — R002 Palpitations: Secondary | ICD-10-CM | POA: Insufficient documentation

## 2019-10-10 DIAGNOSIS — Z7984 Long term (current) use of oral hypoglycemic drugs: Secondary | ICD-10-CM | POA: Diagnosis not present

## 2019-10-10 DIAGNOSIS — Z87891 Personal history of nicotine dependence: Secondary | ICD-10-CM | POA: Diagnosis not present

## 2019-10-10 DIAGNOSIS — Z79899 Other long term (current) drug therapy: Secondary | ICD-10-CM | POA: Diagnosis not present

## 2019-10-10 DIAGNOSIS — R079 Chest pain, unspecified: Secondary | ICD-10-CM | POA: Diagnosis not present

## 2019-10-10 DIAGNOSIS — R531 Weakness: Secondary | ICD-10-CM | POA: Diagnosis not present

## 2019-10-10 DIAGNOSIS — E119 Type 2 diabetes mellitus without complications: Secondary | ICD-10-CM | POA: Insufficient documentation

## 2019-10-10 LAB — CBC
HCT: 41 % (ref 39.0–52.0)
Hemoglobin: 14.2 g/dL (ref 13.0–17.0)
MCH: 31.8 pg (ref 26.0–34.0)
MCHC: 34.6 g/dL (ref 30.0–36.0)
MCV: 91.9 fL (ref 80.0–100.0)
Platelets: 211 10*3/uL (ref 150–400)
RBC: 4.46 MIL/uL (ref 4.22–5.81)
RDW: 13 % (ref 11.5–15.5)
WBC: 12.3 10*3/uL — ABNORMAL HIGH (ref 4.0–10.5)
nRBC: 0 % (ref 0.0–0.2)

## 2019-10-10 LAB — BASIC METABOLIC PANEL
Anion gap: 11 (ref 5–15)
BUN: 23 mg/dL — ABNORMAL HIGH (ref 6–20)
CO2: 23 mmol/L (ref 22–32)
Calcium: 9.5 mg/dL (ref 8.9–10.3)
Chloride: 103 mmol/L (ref 98–111)
Creatinine, Ser: 0.92 mg/dL (ref 0.61–1.24)
GFR calc Af Amer: >60 mL/min (ref >60)
GFR calc non Af Amer: >60 mL/min (ref >60)
Glucose, Bld: 147 mg/dL — ABNORMAL HIGH (ref 70–99)
Potassium: 4.6 mmol/L (ref 3.5–5.1)
Sodium: 137 mmol/L (ref 135–145)

## 2019-10-10 LAB — TROPONIN I (HIGH SENSITIVITY): Troponin I (High Sensitivity): 9 ng/L (ref <18)

## 2019-10-10 IMAGING — DX DG CHEST 2V
2 series · 2 of 2 positions shown · non-contrast
Comparison: [DATE]

CLINICAL DATA: Chest pain

EXAM:
CHEST - 2 VIEW

[chest pa]
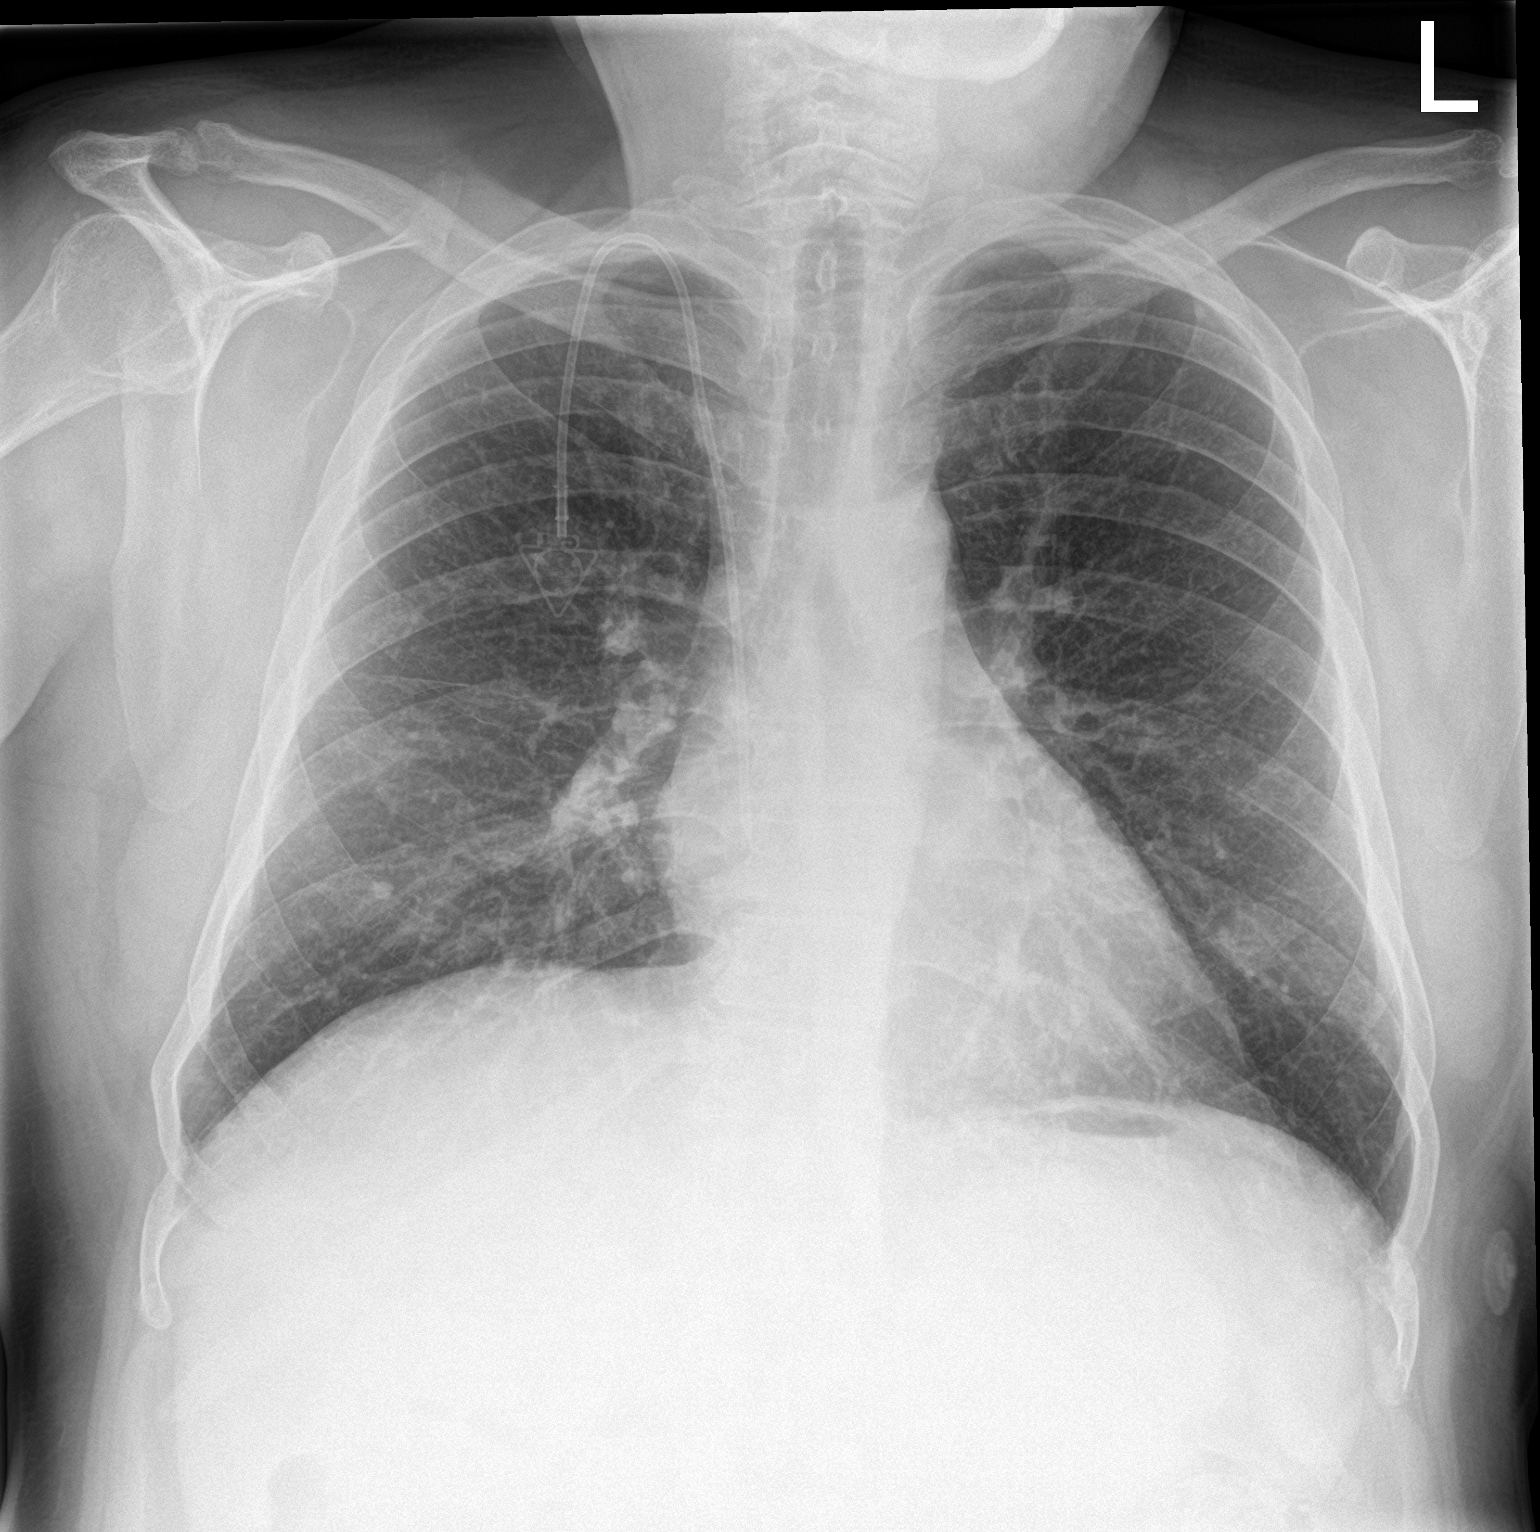

[chest lat]
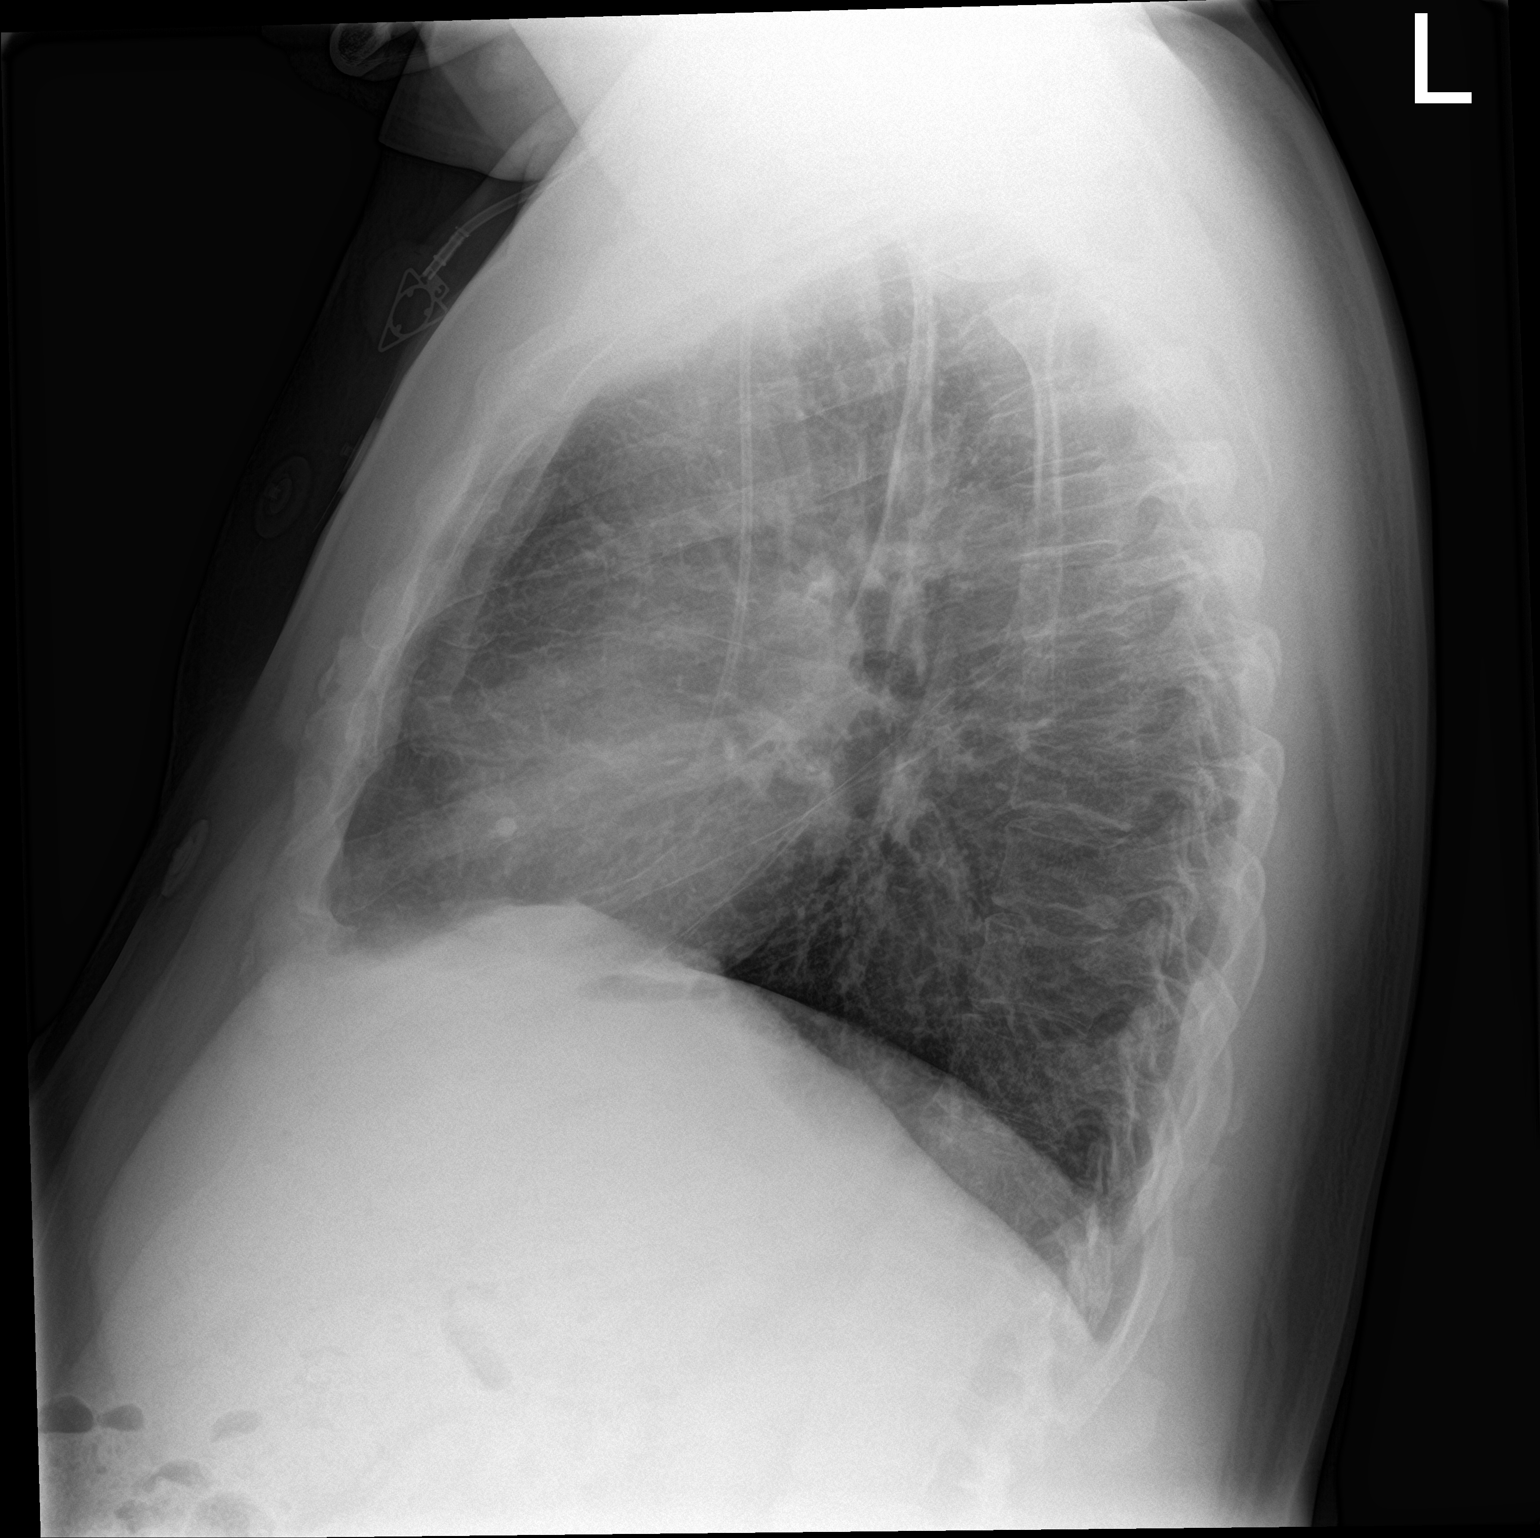

[2 of 2 positions shown; findings below may reference images not displayed]

FINDINGS: Right-sided central venous port tip over the right atrium. No
consolidation or effusion. Normal heart size. No pneumothorax.
Calcified granuloma in the right lower lung.
IMPRESSION: No active cardiopulmonary disease.

## 2019-10-10 MED ORDER — SODIUM CHLORIDE 0.9% FLUSH: 3.0000 mL | Freq: Once | INTRAVENOUS | Status: DC

## 2019-10-10 NOTE — ED Triage Notes (Signed)
Spanish interpreter used for triage:  Pt reports intermittent severe pain in the back of his head, and reports heart palpitations at times. Pt also reports bilateral knee weakness when the pain comes on. This has been going on for 1 week. Pt a.o, nad noted.

## 2019-10-11 ENCOUNTER — Emergency Department (HOSPITAL_COMMUNITY): Payer: Medicaid Other

## 2019-10-11 DIAGNOSIS — R519 Headache, unspecified: Secondary | ICD-10-CM | POA: Diagnosis not present

## 2019-10-11 LAB — TROPONIN I (HIGH SENSITIVITY): Troponin I (High Sensitivity): 9 ng/L (ref <18)

## 2019-10-11 IMAGING — CT CT HEAD W/O CM
4 series · 17 of 47 positions shown, 19 images · non-contrast
Comparison: [DATE]

CLINICAL DATA: Headache

EXAM:
CT HEAD WITHOUT CONTRAST
TECHNIQUE: Contiguous axial images were obtained from the base of the skull
through the vertex without intravenous contrast.

[Series 3: head without · axial · non-contrast · 0.46mm/px · z∈[+208,+333]mm · 7 of 35 slices shown, 9 images]
[im 5/35  brain]
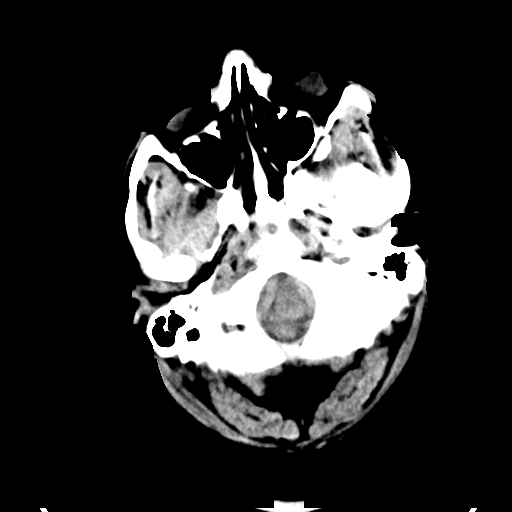
[im 5/35  bone]
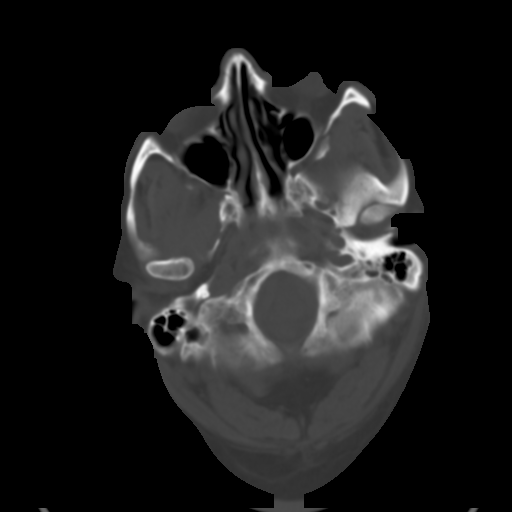
[im 9/35  brain]
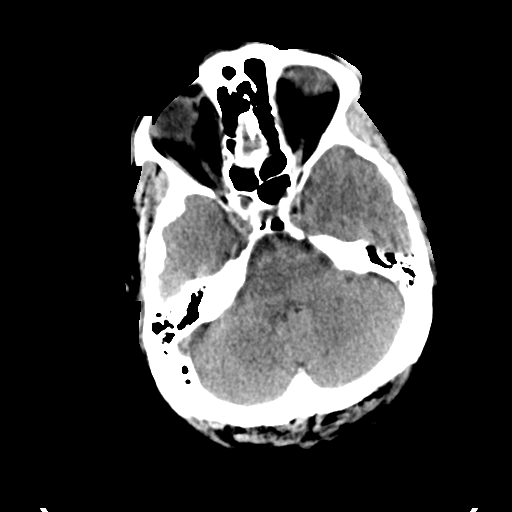
[im 13/35  brain]
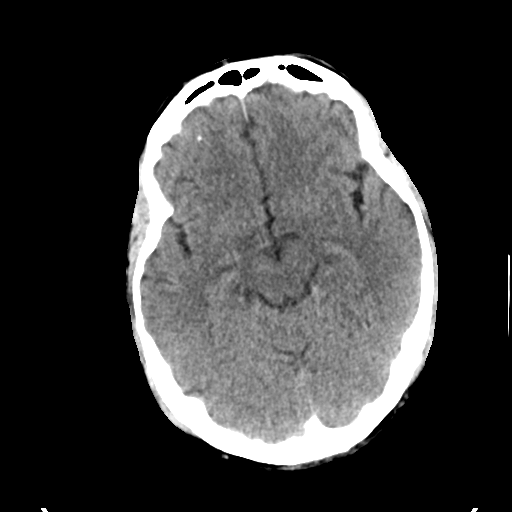
[im 18/35  brain]
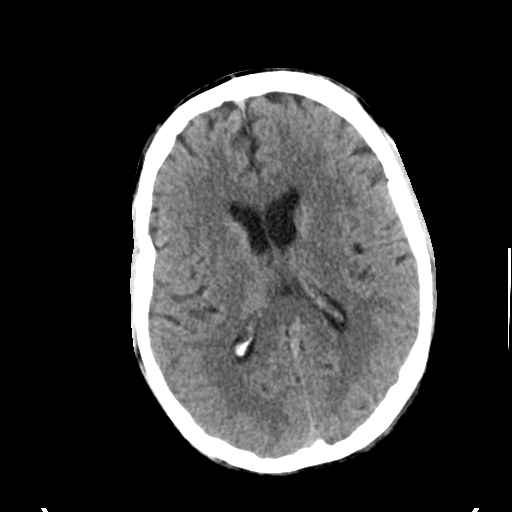
[im 22/35  brain]
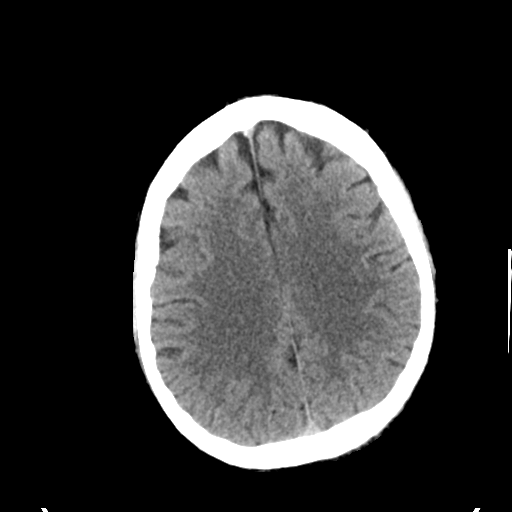
[im 22/35  bone]
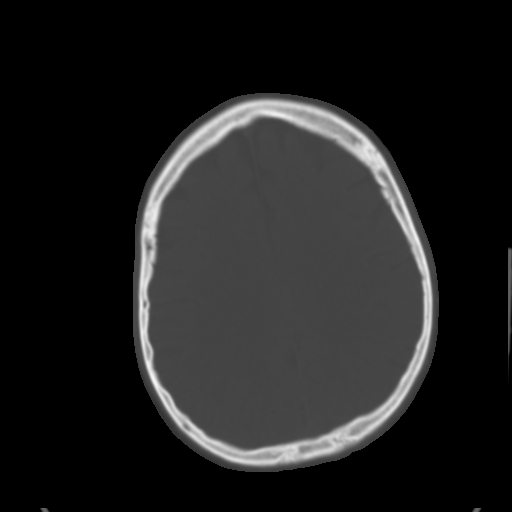
[im 26/35  brain]
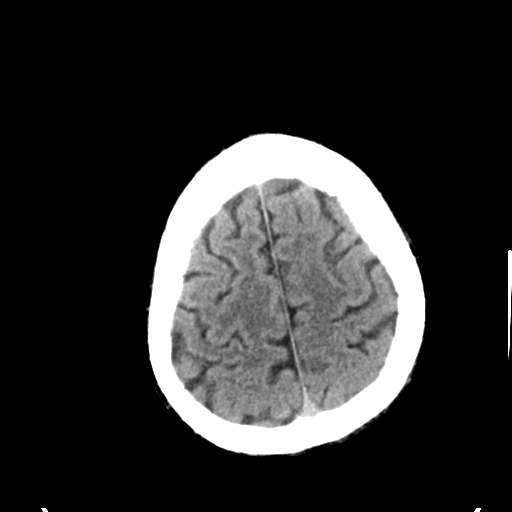
[im 30/35  brain]
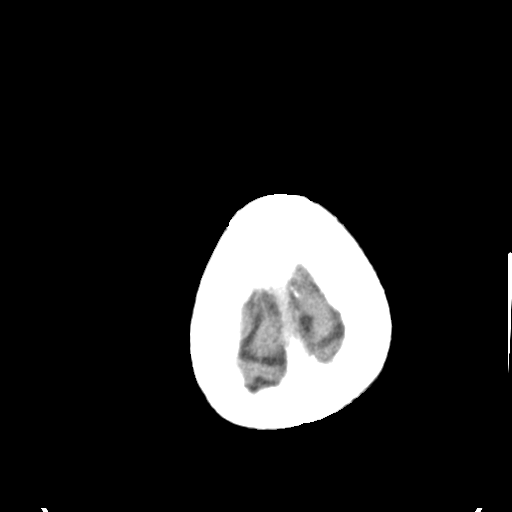

[Series 4: head bone · axial · 0.46mm/px · z∈[+204,+266]mm · 4 of 88 slices shown]
[im 9/88  bone]
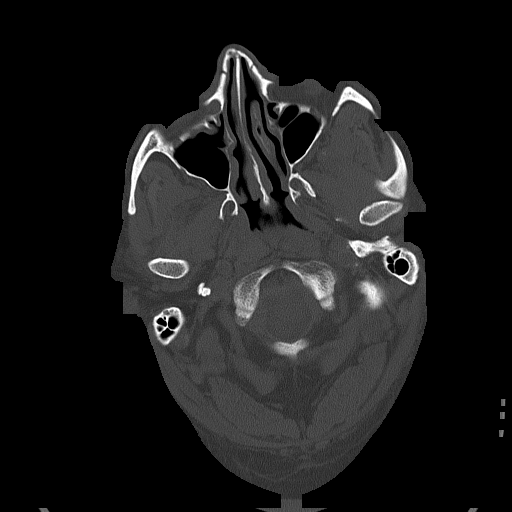
[im 18/88  bone]
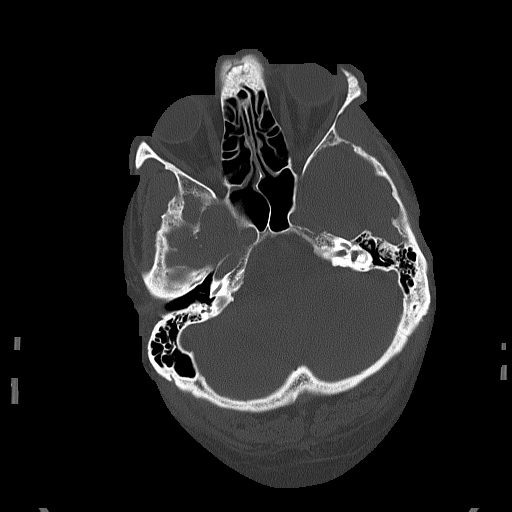
[im 27/88  bone]
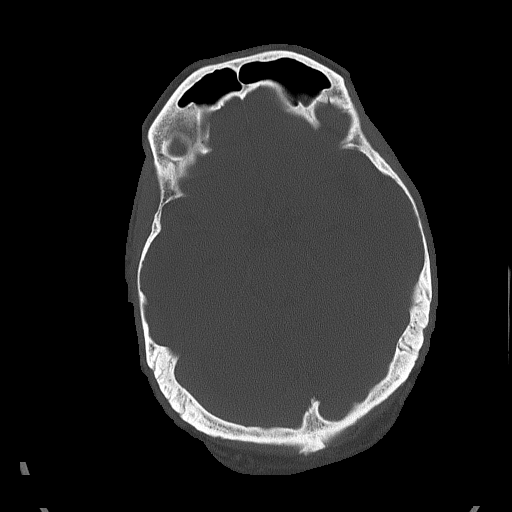
[im 40/88  bone]
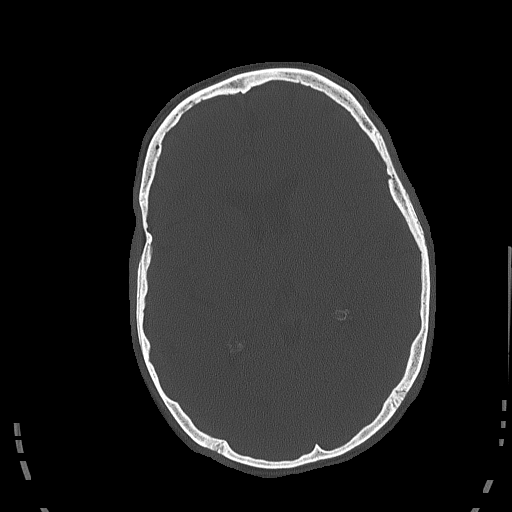

[Series 5: head without cor · coronal · non-contrast · 0.33mm/px · 3 of 74 slices shown]
[im 25/74  brain]
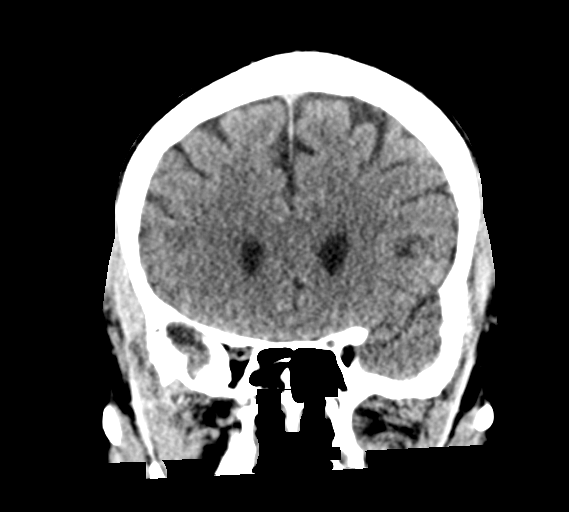
[im 33/74  brain]
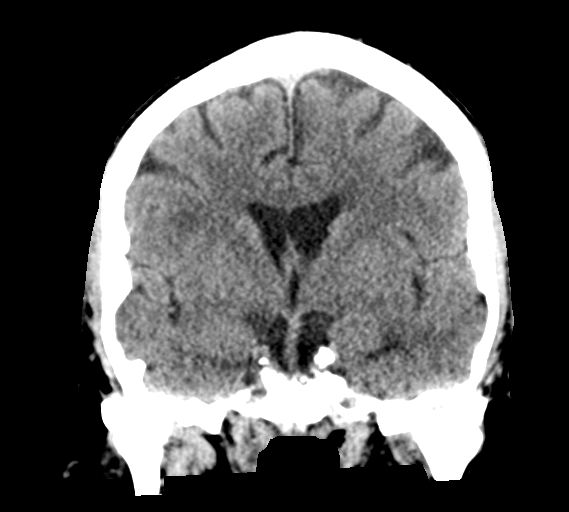
[im 41/74  brain]
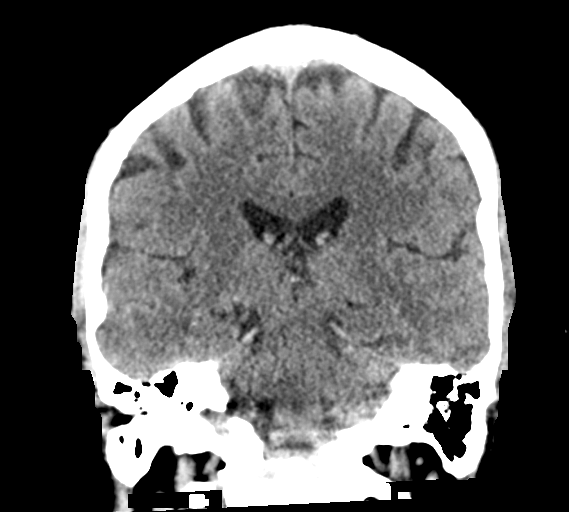

[Series 6: head without sag · sagittal · non-contrast · 0.36mm/px · 3 of 67 slices shown]
[im 23/67  brain]
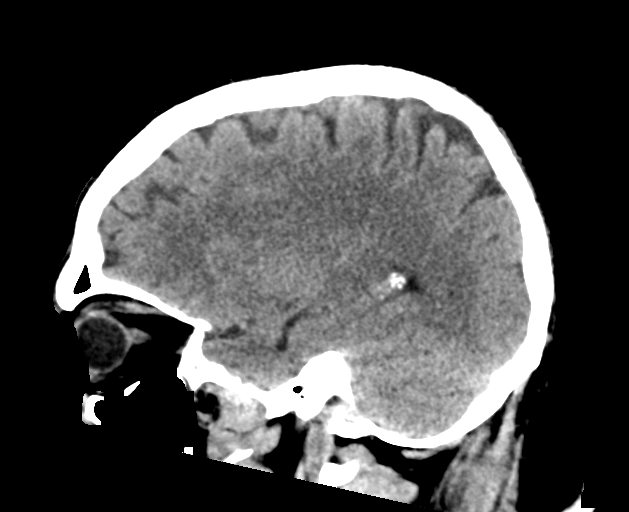
[im 34/67  brain]
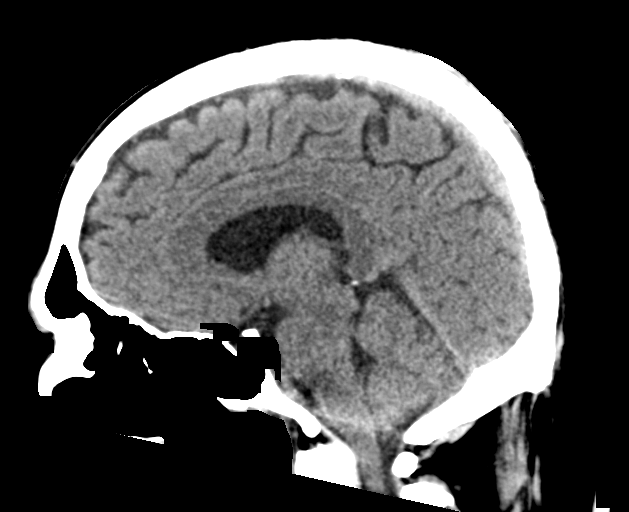
[im 45/67  brain]
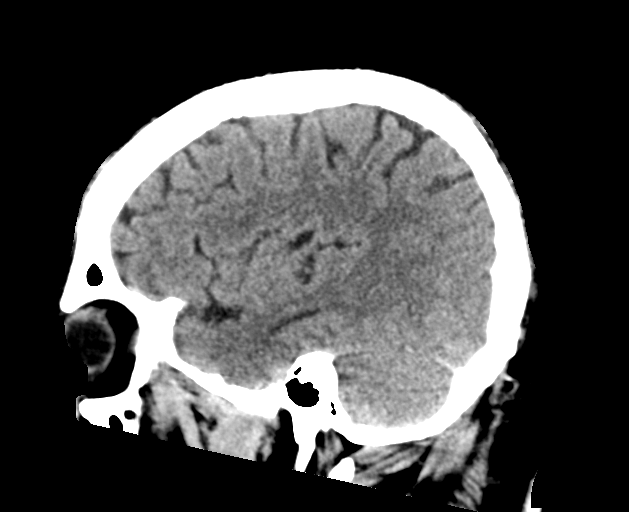

[17 of 47 positions shown; findings below may reference images not displayed]

FINDINGS: Brain: No acute intracranial abnormality. Specifically, no
hemorrhage, hydrocephalus, mass lesion, acute infarction, or
significant intracranial injury.

Vascular: No hyperdense vessel or unexpected calcification.

Skull: No acute calvarial abnormality.

Sinuses/Orbits: Visualized paranasal sinuses and mastoids clear.
Orbital soft tissues unremarkable.

Other: None
IMPRESSION: Normal study.

## 2019-10-11 MED ORDER — PROCHLORPERAZINE MALEATE 5 MG PO TABS
10.0000 mg | ORAL_TABLET | Freq: Once | ORAL | Status: AC
  Administered 2019-10-11: 01:00:00 10 mg via ORAL
  Filled 2019-10-11: qty 2

## 2019-10-11 MED ORDER — DIPHENHYDRAMINE HCL 25 MG PO CAPS
25.0000 mg | ORAL_CAPSULE | Freq: Once | ORAL | Status: AC
  Administered 2019-10-11: 25 mg via ORAL
  Filled 2019-10-11: qty 1

## 2019-10-11 MED ORDER — TRAMADOL HCL 50 MG PO TABS: 50.0000 mg | ORAL_TABLET | Freq: Three times a day (TID) | ORAL | 0 refills | Status: AC | PRN

## 2019-10-11 NOTE — ED Provider Notes (Signed)
[facility] EMERGENCY DEPARTMENT Provider Note   CSN: [id number] Arrival date & time: 10/10/19  2104     History   Chief Complaint Chief Complaint  Patient presents with   Headache   Palpitations    HPI Blake Vaughn is a 58 y.o. male.     58 y.o male with a PMH of HTN,DM, Neuropathy, Lymphoma presents to the ED with a chief complaint of headache x 1 week.  Patient describes an intermittent ache to the occipital side with radiation onto the left side of his head. There are no alleviating or exacerbating factors. He does report taking tylenol which helped with his symptoms. He was seen at [facility] yesterday, and given a prescription for tramadol but reports he did not pick up this medication at the pharmacy.  He also reports a tingling sensation to his arms when these headaches occur, he does have a prior history of neuropathy.  Patient reports he is felt a couple episodes where he likely could syncopized as he felt very weak to his bilateral knees.  He does have a prior history of cancer, his last chemotherapy was received on May 2020.  He denies any changes in vision, neck pain, fever, nausea, vomiting.  The history is provided by the patient.    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   Qualifier: Diagnosis of  By: [name], [name]    GI bleed 11/05/2018   Hypercholesterolemia    Hypertension 11/27/2011   Lymphoma ([place])    gets annual chemo last tx Feb 2013   TOBACCO USE, QUIT 08/18/2009   Qualifier: Diagnosis of  By: Drue Flirt  MD, Taineisha      Patient Active Problem List   Diagnosis Date Noted   Right shoulder pain 08/16/2019   Bilateral shoulder pain 08/02/2019   Neuropathy 07/30/2019   Port-A-Cath in place 11/25/2018   Cancer of right colon ([place]) 11/12/2018   Syncope due to orthostatic hypotension 11/05/2018   Hypoalbuminemia  11/05/2018   Hypoproteinemia ([place]) 11/05/2018   Uses Spanish as primary spoken language 10/23/2018   Obesity (BMI 30-39.9) 10/23/2018   Mass of cecum s/p right colectomy 10/21/2018 10/22/2018   Nocturnal leg cramps 07/16/2017   HTN (hypertension) 11/27/2011   Pure hypercholesterolemia 09/24/2009   ERECTILE DYSFUNCTION, 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   Diabetes type 2, uncontrolled ([place]) 07/09/2009   DIABETIC CATARACT [id number]   Follicular lymphoma ([place]), 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    Past Surgical History:  Procedure Laterality Date   ESOPHAGOGASTRODUODENOSCOPY (EGD) WITH PROPOFOL N/A 11/05/2018   Procedure: ESOPHAGOGASTRODUODENOSCOPY (EGD) WITH PROPOFOL;  Surgeon: [name], MD;  Location: [place];  Service: Endoscopy;  Laterality: N/A;   IR IMAGING GUIDED PORT INSERTION  11/24/2018   LAPAROSCOPIC APPENDECTOMY N/A 10/21/2018   Procedure: Exploratory laparotomy right hemicolectomy;  Surgeon: [name], MD;  Location: [place];  Service: General;  Laterality: N/A;        Home Medications    Prior to Admission medications   Medication Sig Start Date End Date Taking? Authorizing Provider  Accu-Chek Softclix Lancets lancets Use as instructed 07/30/19   [name], [name], DO  diphenhydrAMINE (BENADRYL) 50 MG tablet Take 1 tablet (50 mg total) by mouth as directed. Take Benadryl 50 mg   1 hr  Before  CT scans. 08/30/19   [name], MD  gabapentin (NEURONTIN) 300 MG capsule Take 1 capsule (300 mg total) by  mouth 3 (three) times daily. 07/25/19 08/24/19  [name], [name], DO  glipiZIDE (GLUCOTROL XL) 5 MG 24 hr tablet Take 1 tablet (5 mg total) by mouth 2 (two) times daily. 07/25/19   [name], [name], DO  glucose blood (ACCU-CHEK AVIVA PLUS) test strip Use as instructed 07/30/19   [name], [name], DO  lidocaine-prilocaine (EMLA) cream Apply to affected area once 11/25/18   [name], MD  metFORMIN (GLUCOPHAGE) 1000 MG tablet Take 1 tablet (1,000 mg total) by mouth 2 (two) times  daily with a meal. 07/25/19 10/23/19  [name], [name], DO  predniSONE (DELTASONE) 50 MG tablet Take Prednisone 50 mg  At   13 hr,  7 hr,  And  1 hr  Before  CT  Scans. 06/02/19   [name], MD  predniSONE (DELTASONE) 50 MG tablet Take first tablet 13 hours before CT scan, second tablet 7 hours before CT scan, and third tablet 1 hour before CT scan 08/30/19   [name], MD  prochlorperazine (COMPAZINE) 10 MG tablet Take 1 tablet (10 mg total) by mouth every 6 (six) hours as needed (Nausea or vomiting). 04/21/19   [name], MD  sitaGLIPtin (JANUVIA) 50 MG tablet Take 1 tablet (50 mg total) by mouth daily. 07/25/19   [name], [name], DO  traMADol (ULTRAM) 50 MG tablet Take 1 tablet (50 mg total) by mouth every 8 (eight) hours as needed for up to 3 days. 10/11/19 10/14/19  Janeece Fitting, [name]    Family History Family History  Problem Relation Age of Onset   Diabetes Mother    Diabetes Father    Renal Disease Brother    Diabetes Brother    Cancer Brother        lymphoma     Social History Social History   Tobacco Use   Smoking status: Former Smoker    Packs/day: 0.50    Years: 30.00    Pack years: 15.00    Quit date: 12/08/2004    Years since quitting: 14.8   Smokeless tobacco: Former Systems developer    Quit date: 11/20/2006  Substance Use Topics   Alcohol use: No   Drug use: No     Allergies   Iohexol   Review of Systems Review of Systems  Constitutional: Negative for fever.  HENT: Negative for sore throat.   Eyes: Negative for photophobia and visual disturbance.  Respiratory: Negative for shortness of breath.   Cardiovascular: Negative for chest pain.  Gastrointestinal: Negative for abdominal pain, diarrhea, nausea and vomiting.  Genitourinary: Negative for flank pain.  Musculoskeletal: Negative for back pain.  Skin: Negative for pallor and wound.  Neurological: Positive for weakness and headaches. Negative for numbness.     Physical Exam Updated Vital Signs BP 126/76 (BP  Location: Right Arm)    Pulse 78    Temp 97.9 F (36.6 C) (Oral)    Resp 17    Ht 5\' 5"  (1.651 m)    Wt 86.2 kg    SpO2 97%    BMI 31.62 kg/m   Physical Exam Vitals signs and nursing note reviewed.  Constitutional:      Appearance: He is well-developed.  HENT:     Head: Normocephalic and atraumatic.     Mouth/Throat:     Mouth: Mucous membranes are moist.     Pharynx: Oropharynx is clear.  Eyes:     Extraocular Movements: Extraocular movements intact.     Right eye: No nystagmus.     Left eye: No nystagmus.  Pupils:     Right eye: Pupil is reactive.     Left eye: Pupil is reactive.     Comments: Pupils are equal and reactive.  Neck:     Trachea: Trachea normal.     Meningeal: Brudzinski's sign and Kernig's sign absent.     Comments: No neck rigidity, full ROM. Cardiovascular:     Rate and Rhythm: Normal rate.     Heart sounds: No murmur.  Pulmonary:     Effort: Pulmonary effort is normal.     Breath sounds: Normal breath sounds. No wheezing.     Comments: No wheezing, rhonchi, rales. Abdominal:     General: Bowel sounds are normal. There is no distension.     Palpations: Abdomen is soft. There is no mass.     Tenderness: There is no abdominal tenderness.  Skin:    General: Skin is warm and dry.  Neurological:     Mental Status: He is alert and oriented to person, place, and time.     Comments: Alert, oriented, thought content appropriate. Speech fluent without evidence of aphasia. Able to follow 2 step commands without difficulty.  Cranial Nerves:  II:  Peripheral visual fields grossly normal, pupils, round, reactive to light III,IV, VI: ptosis not present, extra-ocular motions intact bilaterally  V,VII: smile symmetric, facial light touch sensation equal VIII: hearing grossly normal bilaterally  IX,X: midline uvula rise  XI: bilateral shoulder shrug equal and strong XII: midline tongue extension  Motor:  5/5 in upper and lower extremities bilaterally including  strong and equal grip strength and dorsiflexion/plantar flexion Sensory: light touch normal in all extremities.  Cerebellar: normal finger-to-nose with bilateral upper extremities, pronator drift negative Gait: normal gait and balance       ED Treatments / Results  Labs (all labs ordered are listed, but only abnormal results are displayed) Labs Reviewed  BASIC METABOLIC PANEL - Abnormal; Notable for the following components:      Result Value   Glucose, Bld 147 (*)    BUN 23 (*)    All other components within normal limits  CBC - Abnormal; Notable for the following components:   WBC 12.3 (*)    All other components within normal limits  TROPONIN I (HIGH SENSITIVITY)  TROPONIN I (HIGH SENSITIVITY)    EKG None  Radiology Dg Chest 2 View  Result Date: 10/10/2019 CLINICAL DATA:  Chest pain EXAM: CHEST - 2 VIEW COMPARISON:  08/21/2019 FINDINGS: Right-sided central venous port tip over the right atrium. No consolidation or effusion. Normal heart size. No pneumothorax. Calcified granuloma in the right lower lung. IMPRESSION: No active cardiopulmonary disease. Electronically Signed   By: Donavan Foil M.D.   On: 10/10/2019 21:31   Ct Head Wo Contrast  Result Date: 10/11/2019 CLINICAL DATA:  Headache EXAM: CT HEAD WITHOUT CONTRAST TECHNIQUE: Contiguous axial images were obtained from the base of the skull through the vertex without intravenous contrast. COMPARISON:  01/13/2019 FINDINGS: Brain: No acute intracranial abnormality. Specifically, no hemorrhage, hydrocephalus, mass lesion, acute infarction, or significant intracranial injury. Vascular: No hyperdense vessel or unexpected calcification. Skull: No acute calvarial abnormality. Sinuses/Orbits: Visualized paranasal sinuses and mastoids clear. Orbital soft tissues unremarkable. Other: None IMPRESSION: Normal study. Electronically Signed   By: Rolm Baptise M.D.   On: 10/11/2019 01:30    Procedures Procedures (including critical care  time)  Medications Ordered in ED Medications  sodium chloride flush (NS) 0.9 % injection 3 mL (has no administration in time range)  diphenhydrAMINE (  BENADRYL) capsule 25 mg (25 mg Oral Given 10/11/19 0111)  prochlorperazine (COMPAZINE) tablet 10 mg (10 mg Oral Given 10/11/19 0111)     Initial Impression / Assessment and Plan / ED Course  I have reviewed the triage vital signs and the nursing notes.  Pertinent labs & imaging results that were available during my care of the patient were reviewed by me and considered in my medical decision making (see chart for details).       Patient with past medical history of cancer, diabetes presents to the ED with a x1 week.  He was seen in urgent care yesterday, prescribed some tramadol however has not picked up the prescription as he reports this was not located at [place].  He reports this pain feels like an ache constant to the back of his head.  He is had no changes in vision, neck pain, fevers.  He has no neck rigidity on my exam, vitals are within normal limits low suspicion for meningitis.  He also reports no changes in vision, he does state there is some weakness to his legs associated with this headache, feels like he could "syncopized " however has not done so.  CBC showed a slight leukocytosis of 12.3, BMP showed no electrolyte derangement, BUN is slightly elevated at 23, creatinine level is within normal limits.  First troponin is negative.  Neuro exam is unremarkable, without any neuro deficits.  A CT head was obtained to further evaluate patient's headache as he has had this ongoing symptoms for the past week.  CT head showed: No acute abnormality.  Chest x-ray was ordered which showed no consolidation, pneumothorax, pleural effusion.  Patient was given headache cocktail consisting of Benadryl along with Compazine, will reassess her symptoms.  I have extensively reviewed his chart, he does have previous visits to the emergency department  for a bad headache.  2:24 AM patient was reassessed by me, reports improvement in symptoms after headache cocktail.  Results of his CT were discussed with him, he reports he was anxious due to thinking that he likely had cancer in his head, suggested that he make an appoint with his oncologist in order to obtain a PET scan to further evaluate for any metastasis.  Patient was seen in urgent care yesterday, given a prescription for tramadol, unfortunate this prescription was printed and patient did not receive and he states this was not picked up from the pharmacy, will provide him with a E-prescription for his medication.  Patient understands agrees with management, with stable vital signs.  Patient stable for discharge.   Portions of this note were generated with [employer]. Dictation errors may occur despite best attempts at proofreading.  Final Clinical Impressions(s) / ED Diagnoses   Final diagnoses:  Bad headache    ED Discharge Orders         Ordered    traMADol (ULTRAM) 50 MG tablet  Every 8 hours PRN     10/11/19 0223           Janeece Fitting, [name] 10/11/19 0225    Horton, Barbette Hair, MD 10/12/19 [phone]

## 2019-10-11 NOTE — ED Notes (Signed)
Patient transported to CT 

## 2019-10-11 NOTE — Discharge Instructions (Addendum)
Los resultados de su tomografia fueron normales.  Le he recetado medicina fuerte para el dolor de [place] solo tomela cuando tenga un dolor fuerte. Puede tomar tylenol o iburprofen para un dolor menos fuerte.

## 2019-10-17 ENCOUNTER — Inpatient Hospital Stay: Payer: Medicaid Other | Attending: Hematology

## 2019-10-17 ENCOUNTER — Other Ambulatory Visit: Payer: Self-pay

## 2019-10-17 DIAGNOSIS — Z95828 Presence of other vascular implants and grafts: Secondary | ICD-10-CM

## 2019-10-17 DIAGNOSIS — C182 Malignant neoplasm of ascending colon: Secondary | ICD-10-CM | POA: Insufficient documentation

## 2019-10-17 DIAGNOSIS — Z452 Encounter for adjustment and management of vascular access device: Secondary | ICD-10-CM | POA: Insufficient documentation

## 2019-10-17 MED ORDER — HEPARIN SOD (PORK) LOCK FLUSH 100 UNIT/ML IV SOLN
500.0000 [IU] | Freq: Once | INTRAVENOUS | Status: AC | PRN
  Administered 2019-10-17: 10:00:00 500 [IU]
  Filled 2019-10-17: qty 5

## 2019-10-17 MED ORDER — SODIUM CHLORIDE 0.9% FLUSH
10.0000 mL | INTRAVENOUS | Status: DC | PRN
  Administered 2019-10-17: 10 mL
  Filled 2019-10-17: qty 10

## 2019-11-23 ENCOUNTER — Other Ambulatory Visit: Payer: Self-pay

## 2019-11-25 NOTE — Progress Notes (Signed)
[place]   [phone] [phone]   Clinic Follow up Note   Patient Care Team: [name], [name], DO as PCP - General [name], MD as Consulting Physician (Colon and Rectal Surgery) [name], MD as Consulting Physician (Hematology)  Date of Service:  12/05/2019  CHIEF COMPLAINT: F/u on colon cancer  SUMMARY OF ONCOLOGIC HISTORY: Oncology History Overview Note  Cancer Staging Cancer of right colon [facility]) Staging form: Colon and Rectum, AJCC 8th Edition - Pathologic stage from 10/21/2018: Stage IVC (pT4b, pN1a, pM1c) - Signed by [name], MD on 43/07/3817  Follicular lymphoma ([place]), History of Staging form: Lymphoid Neoplasms, AJCC 6th Edition - Clinical: Stage II - Signed by Curt Bears, MD on 03/10/7542     Follicular lymphoma [facility]), History of  11/2009 Initial Diagnosis   Follicular lymphoma ([place]), History of    Chemotherapy   1) Status post 6 cycles of systemic chemotherapy with CHOP/Rituxan last dose given 05/01/2009.  2) Maintenance Rituxan at 375 mg per meter square given every 2 months status post 12 cycles    Cancer of right colon ([place])  10/21/2018 Surgery   Exploratory laparotomy right hemicolectomy by Dr. [name] and Dr. [name]  10/21/18   10/21/2018 Pathology Results   Diagnosis 10/21/18 Colon, segmental resection for tumor, right ascending and appendix - ADENOCARCINOMA, MODERATE TO POORLY DIFFERENTIATED (4 CM) - METASTATIC CARCINOMA INVOLVING ONE OF EIGHTEEN LYMPH NODES (1/18) - CARCINOMA EXTENDS INTO THE APPENDIX - TWO TUMOR DEPOSITS PRESENT - SEE ONCOLOGY TABLE AND COMMENT BELOW   10/21/2018 Cancer Staging   Staging form: Colon and Rectum, AJCC 8th Edition - Pathologic stage from 10/21/2018: Stage IVC (pT4b, pN1a, pM1c) - Signed by [name], MD on 11/13/2018   11/04/2018 Imaging   CT AP W Contrast 11/04/18  IMPRESSION: 1. Interval appendectomy. There is residual soft tissue thickening and  stranding in the right lower quadrant adjacent to the cecum which may represent ongoing inflammation versus postsurgical changes. A small soft tissue density adjacent to the surgical sutures may reflect small hematoma or operative collection. No large focal fluid collection to suggest drainable abscess allowing for absence of contrast 2. Fluid-filled colon without wall thickening, could reflect diarrheal process   11/08/2018 Imaging   CT Chest W Contrast 11/08/18  IMPRESSION: 1. No acute findings are noted in the thorax to account for the patient's symptoms. 2. Aortic atherosclerosis, in addition to left main and 3 vessel coronary artery disease. Please note that although the presence of coronary artery calcium documents the presence of coronary artery disease, the severity of this disease and any potential stenosis cannot be assessed on this non-gated CT examination. Assessment for potential risk factor modification, dietary therapy or pharmacologic therapy may be warranted, if clinically indicated. 3. Additional incidental findings, as above. Aortic Atherosclerosis (ICD10-I70.0).   11/12/2018 Initial Diagnosis   Cancer of right colon ([place])   11/25/2018 -  Chemotherapy   FOLFOX q2weeks starting 11/25/18. Due to moderate thrombocytopenia, I will stop 5-FU bolus, and reduce pump infusion to 2242m/m2 starting with cycle 7.  Plan for last treatment on 05/05/19.    02/10/2019 Imaging   CT AP W Contrast  IMPRESSION: 1. No evidence metastatic disease. 2. Hepatic steatosis. 3. Mildly enlarged prostate. 4. Aortic atherosclerosis (ICD10-170.0). coronary artery calcification.   09/02/2019 Imaging   CT AP W Contrast IMPRESSION: 1. No signs to suggest metastatic disease in the abdomen or pelvis. 2. Aortic atherosclerosis these, as well as right coronary artery disease.  CURRENT THERAPY:  Surveillance  INTERVAL HISTORY:  Blake Vaughn is here for a follow up of colon  cancer. He presents to the clinic with his interpretor.  He notes he is doing well. He denies nay recent abdominal issues. He notes neuropathy pain is occasional. This has effected his dexterity to where he cannot button shirts. This is due to numbness mostly. His BG is high today and he did not take his DM meds this morning. He has been taking garlic clove with water which helps with his HA.  This morning he got news his brother passed away this morning at [facility], not from [place].    REVIEW OF SYSTEMS:   Constitutional: Denies fevers, chills or abnormal weight loss Eyes: Denies blurriness of vision Ears, nose, mouth, throat, and face: Denies mucositis or sore throat Respiratory: Denies cough, dyspnea or wheezes Cardiovascular: Denies palpitation, chest discomfort or lower extremity swelling Gastrointestinal:  Denies nausea, heartburn or change in bowel habits Skin: Denies abnormal skin rashes Lymphatics: Denies new lymphadenopathy or easy bruising Neurological (+) Neuropathy of hands with mainly numbness.  Behavioral/Psych: Mood is stable, no new changes  All other systems were reviewed with the patient and are negative.  MEDICAL HISTORY:  Past Medical History:  Diagnosis Date  . Appendicitis 10/21/2018  . Diabetes mellitus   . Follicular lymphoma ([place]), 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   Qualifier: Diagnosis of  By: [name], [name]   . GI bleed 11/05/2018  . Hypercholesterolemia   . Hypertension 11/27/2011  . Lymphoma [facility])    gets annual chemo last tx Feb 2013  . TOBACCO USE, QUIT 08/18/2009   Qualifier: Diagnosis of  By: Drue Flirt  MD, Merrily Brittle      SURGICAL HISTORY: Past Surgical History:  Procedure Laterality Date  . ESOPHAGOGASTRODUODENOSCOPY (EGD) WITH PROPOFOL N/A 11/05/2018   Procedure: ESOPHAGOGASTRODUODENOSCOPY (EGD) WITH PROPOFOL;  Surgeon: [name], MD;  Location: [place];  Service: Endoscopy;  Laterality: N/A;  . IR IMAGING GUIDED PORT INSERTION  11/24/2018   . LAPAROSCOPIC APPENDECTOMY N/A 10/21/2018   Procedure: Exploratory laparotomy right hemicolectomy;  Surgeon: [name], MD;  Location: [place];  Service: General;  Laterality: N/A;    I have reviewed the social history and family history with the patient and they are unchanged from previous note.  ALLERGIES:  is allergic to iohexol.  MEDICATIONS:  Current Outpatient Medications  Medication Sig Dispense Refill  . Accu-Chek Softclix Lancets lancets Use as instructed 100 each 12  . diphenhydrAMINE (BENADRYL) 50 MG tablet Take 1 tablet (50 mg total) by mouth as directed. Take Benadryl 50 mg   1 hr  Before  CT scans. 1 tablet 0  . glipiZIDE (GLUCOTROL XL) 5 MG 24 hr tablet Take 1 tablet (5 mg total) by mouth 2 (two) times daily. 90 tablet 1  . glucose blood (ACCU-CHEK AVIVA PLUS) test strip Use as instructed 100 each 12  . lidocaine-prilocaine (EMLA) cream Apply to affected area once 30 g 3  . predniSONE (DELTASONE) 50 MG tablet Take Prednisone 50 mg  At   13 hr,  7 hr,  And  1 hr  Before  CT  Scans. 3 tablet 0  . predniSONE (DELTASONE) 50 MG tablet Take first tablet 13 hours before CT scan, second tablet 7 hours before CT scan, and third tablet 1 hour before CT scan 3 tablet 0  . prochlorperazine (COMPAZINE) 10 MG tablet Take 1 tablet (10 mg total) by mouth every 6 (six) hours as needed (  Nausea or vomiting). 30 tablet 2  . sitaGLIPtin (JANUVIA) 50 MG tablet Take 1 tablet (50 mg total) by mouth daily. 90 tablet 3  . gabapentin (NEURONTIN) 300 MG capsule Take 1 capsule (300 mg total) by mouth 3 (three) times daily. 90 capsule 1  . lidocaine-prilocaine (EMLA) cream Apply 1 application topically as needed. 30 g 1  . metFORMIN (GLUCOPHAGE) 1000 MG tablet Take 1 tablet (1,000 mg total) by mouth 2 (two) times daily with a meal. 180 tablet 1   No current facility-administered medications for this visit.    PHYSICAL EXAMINATION: ECOG PERFORMANCE STATUS: 0 - Asymptomatic  Vitals:    12/05/19 1344  BP: (!) 151/77  Pulse: 100  Resp: 18  Temp: 98.7 F (37.1 C)  SpO2: 100%   Filed Weights   12/05/19 1344  Weight: 198 lb 3.2 oz (89.9 kg)    GENERAL:alert, no distress and comfortable SKIN: skin color, texture, turgor are normal, no rashes or significant lesions EYES: normal, Conjunctiva are pink and non-injected, sclera clear  NECK: supple, thyroid normal size, non-tender, without nodularity LYMPH:  no palpable lymphadenopathy in the cervical, axillary  LUNGS: clear to auscultation and percussion with normal breathing effort HEART: regular rate & rhythm and no murmurs and no lower extremity edema ABDOMEN:abdomen soft, non-tender and normal bowel sounds (+) Mid line surgical incision healed well Musculoskeletal:no cyanosis of digits and no clubbing  NEURO: alert & oriented x 3 with fluent speech, no focal motor/sensory deficits  LABORATORY DATA:  I have reviewed the data as listed CBC Latest Ref Rng & Units 12/05/2019 10/10/2019 08/29/2019  WBC 4.0 - 10.5 K/uL 13.2(H) 12.3(H) 8.9  Hemoglobin 13.0 - 17.0 g/dL 14.1 14.2 13.7  Hematocrit 39.0 - 52.0 % 39.7 41.0 39.9  Platelets 150 - 400 K/uL 206 211 184     CMP Latest Ref Rng & Units 12/05/2019 10/10/2019 08/29/2019  Glucose 70 - 99 mg/dL 225(H) 147(H) 282(H)  BUN 6 - 20 mg/dL 16 23(H) 23(H)  Creatinine 0.61 - 1.24 mg/dL 0.79 0.92 0.84  Sodium 135 - 145 mmol/L 135 137 134(L)  Potassium 3.5 - 5.1 mmol/L 4.1 4.6 4.7  Chloride 98 - 111 mmol/L 103 103 105  CO2 22 - 32 mmol/L '23 23 22  ' Calcium 8.9 - 10.3 mg/dL 8.8(L) 9.5 9.2  Total Protein 6.5 - 8.1 g/dL 6.7 - 6.1(L)  Total Bilirubin 0.3 - 1.2 mg/dL 0.5 - 0.6  Alkaline Phos 38 - 126 U/L 81 - 99  AST 15 - 41 U/L 15 - 17  ALT 0 - 44 U/L 19 - 19      RADIOGRAPHIC STUDIES: I have personally reviewed the radiological images as listed and agreed with the findings in the report. No results found.   ASSESSMENT & PLAN:  Blake Vaughn is a 58 y.o. male with    1.RightCecal adenocarcinoma,pT4bN1aM1c,Stage IVC,with limited peritoneal metastasis,resected,Grade II-III, MMR normal -Diagnosed in 10/2018. Treated with right hemicolectomy.He was found to have 2 smallperitoneal metastasis during the surgery, which were removed. -He completed 12 cycles of adjuvant FOLFOX. -Given high risk of recurrence, I recommend he keep his PAC in place for 1-2 years. Will continue port flush every 6-8 weeks -He is clinically doing stable. He recovered well from chemo. His neuropathy is mainly in hands with numbness but still manageable. Labs reviewed, he has mild elevated WBC, ANC and BG 225. Iron panel and CEA still pending. Physical exam unremarkable.  -Continue surveillance. f/u in 3 months with CT scan.  2. H/o Follicular Lymphoma -Was treated with CHOP/Rituxan in 2010.Then he waspreviouslytreated withMaintenance Rituxancompleted in 2012.Last f/u was in 2017.  3. DM and Hypercholesterolemia, uncontrolled hyperglycemia -On metformin,Glimepiride,Lipitor and metoprolol -I strongly encouraged him to better control his DM and follow up with his PCP about medication dose adjustment.   -His CT AP from 09/02/19 also shows aortic atherosclerosis, which can increase his risk for MI.  -BG 225 today (12/05/19) because he forget to take medication this morning.   4. Financial Support -Medicaid pending -His wife is in [place]. Has 2 children andabrother that help his at home  5. Insomnia -He is currently takingBenadrylas needed which helps  6. 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 He will drop things and have trouble buttoning.   -I discussed if DM is not well controlled this can prolong or worsen his neuropathy.  -He will continue Gabapentin.    Plan -I refilled EMLA cream and Prednisone for CT scan today. He will use prednisone  the day before CT scan  -portflush in 6 weeks -Lab and f/u in 3 months with labs and CT CAP a few days before.    No problem-specific Assessment & Plan notes found for this encounter.   Orders Placed This Encounter  Procedures  . CT Abdomen Pelvis W Contrast    Standing Status:   Future    Standing Expiration Date:   12/04/2020    Order Specific Question:   If indicated for the ordered procedure, I authorize the administration of contrast media per Radiology protocol    Answer:   Yes    Order Specific Question:   Preferred imaging location?    Answer:   [facility]    Order Specific Question:   Is Oral Contrast requested for this exam?    Answer:   Yes, Per Radiology protocol    Order Specific Question:   Radiology Contrast Protocol - do NOT remove file path    Answer:   [email or web address]  . CT Chest W Contrast    Standing Status:   Future    Standing Expiration Date:   12/04/2020    Order Specific Question:   If indicated for the ordered procedure, I authorize the administration of contrast media per Radiology protocol    Answer:   Yes    Order Specific Question:   Preferred imaging location?    Answer:   [facility]    Order Specific Question:   Radiology Contrast Protocol - do NOT remove file path    Answer:   [email or web address]   All questions were answered. The patient knows to call the clinic with any problems, questions or concerns. No barriers to learning was detected. I spent 20 minutes counseling the patient face to face. The total time spent in the appointment was 25 minutes and more than 50% was on counseling and review of test results     [name], MD 12/05/2019   I, Joslyn Devon, am acting as scribe for [name], MD.   I have reviewed the above documentation for accuracy and completeness, and I agree with the above.

## 2019-12-05 ENCOUNTER — Telehealth: Payer: Self-pay | Admitting: Hematology

## 2019-12-05 ENCOUNTER — Inpatient Hospital Stay: Payer: Medicaid Other | Attending: Hematology

## 2019-12-05 ENCOUNTER — Other Ambulatory Visit: Payer: Self-pay

## 2019-12-05 ENCOUNTER — Inpatient Hospital Stay: Payer: Medicaid Other

## 2019-12-05 ENCOUNTER — Inpatient Hospital Stay (HOSPITAL_BASED_OUTPATIENT_CLINIC_OR_DEPARTMENT_OTHER): Payer: Medicaid Other | Admitting: Hematology

## 2019-12-05 VITALS — BP 151/77 | HR 100 | Temp 98.7°F | Resp 18 | Ht 65.0 in | Wt 198.2 lb

## 2019-12-05 DIAGNOSIS — D500 Iron deficiency anemia secondary to blood loss (chronic): Secondary | ICD-10-CM

## 2019-12-05 DIAGNOSIS — Z91041 Radiographic dye allergy status: Secondary | ICD-10-CM

## 2019-12-05 DIAGNOSIS — C182 Malignant neoplasm of ascending colon: Secondary | ICD-10-CM

## 2019-12-05 DIAGNOSIS — Z95828 Presence of other vascular implants and grafts: Secondary | ICD-10-CM

## 2019-12-05 DIAGNOSIS — C8200 Follicular lymphoma grade I, unspecified site: Secondary | ICD-10-CM | POA: Diagnosis not present

## 2019-12-05 LAB — CMP (CANCER CENTER ONLY)
ALT: 19 U/L (ref 0–44)
AST: 15 U/L (ref 15–41)
Albumin: 4 g/dL (ref 3.5–5.0)
Alkaline Phosphatase: 81 U/L (ref 38–126)
Anion gap: 9 (ref 5–15)
BUN: 16 mg/dL (ref 6–20)
CO2: 23 mmol/L (ref 22–32)
Calcium: 8.8 mg/dL — ABNORMAL LOW (ref 8.9–10.3)
Chloride: 103 mmol/L (ref 98–111)
Creatinine: 0.79 mg/dL (ref 0.61–1.24)
GFR, Est AFR Am: >60 mL/min (ref >60)
GFR, Estimated: >60 mL/min (ref >60)
Glucose, Bld: 225 mg/dL — ABNORMAL HIGH (ref 70–99)
Potassium: 4.1 mmol/L (ref 3.5–5.1)
Sodium: 135 mmol/L (ref 135–145)
Total Bilirubin: 0.5 mg/dL (ref 0.3–1.2)
Total Protein: 6.7 g/dL (ref 6.5–8.1)

## 2019-12-05 LAB — IRON AND TIBC
Iron: 35 ug/dL — ABNORMAL LOW (ref 42–163)
Saturation Ratios: 10 % — ABNORMAL LOW (ref 20–55)
TIBC: 356 ug/dL (ref 202–409)
UIBC: 321 ug/dL (ref 117–376)

## 2019-12-05 LAB — FERRITIN: Ferritin: 110 ng/mL (ref 24–336)

## 2019-12-05 LAB — CBC WITH DIFFERENTIAL (CANCER CENTER ONLY)
Abs Immature Granulocytes: 0.03 10*3/uL (ref 0.00–0.07)
Basophils Absolute: 0 10*3/uL (ref 0.0–0.1)
Basophils Relative: 0 %
Eosinophils Absolute: 0.2 10*3/uL (ref 0.0–0.5)
Eosinophils Relative: 1 %
HCT: 39.7 % (ref 39.0–52.0)
Hemoglobin: 14.1 g/dL (ref 13.0–17.0)
Immature Granulocytes: 0 %
Lymphocytes Relative: 18 %
Lymphs Abs: 2.3 10*3/uL (ref 0.7–4.0)
MCH: 31 pg (ref 26.0–34.0)
MCHC: 35.5 g/dL (ref 30.0–36.0)
MCV: 87.3 fL (ref 80.0–100.0)
Monocytes Absolute: 0.9 10*3/uL (ref 0.1–1.0)
Monocytes Relative: 7 %
Neutro Abs: 9.7 10*3/uL — ABNORMAL HIGH (ref 1.7–7.7)
Neutrophils Relative %: 74 %
Platelet Count: 206 10*3/uL (ref 150–400)
RBC: 4.55 MIL/uL (ref 4.22–5.81)
RDW: 12 % (ref 11.5–15.5)
WBC Count: 13.2 10*3/uL — ABNORMAL HIGH (ref 4.0–10.5)
nRBC: 0 % (ref 0.0–0.2)

## 2019-12-05 LAB — CEA (IN HOUSE-CHCC): CEA (CHCC-In House): 2.71 ng/mL (ref 0.00–5.00)

## 2019-12-05 MED ORDER — PREDNISONE 50 MG PO TABS: ORAL_TABLET | ORAL | 0 refills | Status: DC

## 2019-12-05 MED ORDER — HEPARIN SOD (PORK) LOCK FLUSH 100 UNIT/ML IV SOLN
500.0000 [IU] | Freq: Once | INTRAVENOUS | Status: AC | PRN
  Administered 2019-12-05: 13:00:00 500 [IU]
  Filled 2019-12-05: qty 5

## 2019-12-05 MED ORDER — SODIUM CHLORIDE 0.9% FLUSH
10.0000 mL | INTRAVENOUS | Status: DC | PRN
  Administered 2019-12-05: 10 mL
  Filled 2019-12-05: qty 10

## 2019-12-05 MED ORDER — LIDOCAINE-PRILOCAINE 2.5-2.5 % EX CREA: 1.0000 "application " | TOPICAL_CREAM | CUTANEOUS | 1 refills | Status: DC | PRN

## 2019-12-05 MED FILL — LIDOCAINE-PRILOCAINE CREAM: 2.5-2.5 | 30 days supply | Qty: 30 | Fill #0

## 2019-12-05 MED FILL — predniSONE 50 MG TABS: 50 | 1 days supply | Qty: 3 | Fill #0

## 2019-12-05 NOTE — Telephone Encounter (Signed)
Scheduled per 12/28 los, patient received calender and after visit summary print out.

## 2019-12-07 ENCOUNTER — Encounter: Payer: Self-pay | Admitting: Hematology

## 2019-12-20 ENCOUNTER — Encounter: Payer: Self-pay | Admitting: Family Medicine

## 2019-12-20 ENCOUNTER — Other Ambulatory Visit: Payer: Self-pay

## 2019-12-20 ENCOUNTER — Ambulatory Visit (INDEPENDENT_AMBULATORY_CARE_PROVIDER_SITE_OTHER): Payer: Medicaid Other | Admitting: Family Medicine

## 2019-12-20 ENCOUNTER — Ambulatory Visit (HOSPITAL_COMMUNITY)
Admission: RE | Admit: 2019-12-20 | Discharge: 2019-12-20 | Disposition: A | Discharge status: Home / Self Care | Payer: Medicaid Other | Source: Ambulatory Visit | Attending: Family Medicine | Admitting: Family Medicine

## 2019-12-20 VITALS — BP 146/72 | HR 94 | Wt 198.6 lb

## 2019-12-20 DIAGNOSIS — E669 Obesity, unspecified: Secondary | ICD-10-CM | POA: Insufficient documentation

## 2019-12-20 DIAGNOSIS — F419 Anxiety disorder, unspecified: Secondary | ICD-10-CM

## 2019-12-20 DIAGNOSIS — Z6833 Body mass index (BMI) 33.0-33.9, adult: Secondary | ICD-10-CM | POA: Insufficient documentation

## 2019-12-20 DIAGNOSIS — I10 Essential (primary) hypertension: Secondary | ICD-10-CM | POA: Insufficient documentation

## 2019-12-20 DIAGNOSIS — E1165 Type 2 diabetes mellitus with hyperglycemia: Secondary | ICD-10-CM | POA: Diagnosis not present

## 2019-12-20 DIAGNOSIS — F068 Other specified mental disorders due to known physiological condition: Secondary | ICD-10-CM | POA: Diagnosis not present

## 2019-12-20 LAB — POCT GLYCOSYLATED HEMOGLOBIN (HGB A1C): HbA1c, POC (controlled diabetic range): 7.4 % — AB (ref 0.0–7.0)

## 2019-12-20 MED ORDER — SERTRALINE HCL 50 MG PO TABS: 50.0000 mg | ORAL_TABLET | Freq: Every day | ORAL | 0 refills | Status: DC

## 2019-12-20 NOTE — Progress Notes (Signed)
Subjective:    Patient ID: Blake Vaughn, male    DOB: March 09, 1961, 59 y.o.   MRN: [id number]   CC: Anxiety  HPI: Blake Vaughn is a 59 year old gentleman with type 2 diabetes, right colon cancer s/p colectomy in 10/2018 with completion of chemotherapy in 2020, hypertension, and obesity presenting discussed the following:  Entirety of his visit was conducted with Spanish interpreter via iPad and discussion with Dr. Walker Kehr. Clinical history collection challenged by language barrier.  Anxiety/restlessness: States he has had intermittent spells of feeling very anxious, requesting something for him to relax himself.  He is able to point to the left side of his chest and say "this is where I feel the anxiety, " and states it feels "desperate. "Most recent episode he started feeling this way yesterday evening, resolving earlier today.  States he will feel this way for about 5-10 minutes and is then able to calm himself down and will get better.  Notices more at rest when he has time to think.  Denies any chest pains/pressure, shortness of breath, dyspnea on exertion, diaphoresis, lightheadedness/dizziness.  Denies any worsening of his feelings with exertion.  Feels like his heart is beating faster when he feels this way w/ palpitations, denies feeling irregular heartbeat.  He exercises on a regular basis and has no concerns with this.  He has a lot of anxiety surrounding his previous cancer diagnosis and worries that it will come back.  His brother recently passed away due to pneumonia (non-Covid) and had his funeral this past weekend, however patient states this has not made him more stressed.  Does not feel depressed, denies HI or SI.  This feeling of anxiety/restlessness has not woken him up at night, sleeps well.  No significant cardiac family history, no personal history of MI or stroke.  Did not want to fill out GAD-7.   Smoking status reviewed  Review of Systems Per HPI    Objective:  BP (!)  146/72   Pulse 94   Wt 198 lb 9.6 oz (90.1 kg)   SpO2 98%   BMI 33.05 kg/m  Vitals and nursing note reviewed  General: NAD, pleasant Cardiac: RRR, normal heart sounds, no murmurs, non-tender to palpation of anterior chest  Respiratory: CTAB, normal effort Abdomen: soft, nontender Extremities: no edema or cyanosis. WWP. Skin: warm and dry, no rashes noted Neuro: alert and oriented, no focal deficits Psych: normal affect  EKG performed showing NSR without S-T abnormalities.   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 Difficult to obtain a clear understanding of his entire symptomatology due to language barrier despite use of translator. Considered underlying CAD/ACS especially as he endorses feeling "anxious on the left side of his chest," but suspect this is related to palpitations and otherwise would be a very atypical presentation. Reassuringly EKG NSR, low concern for precipitating arrhythmia but could continue to remain on differential. Will rule out thyroid abnormality with TSH today.  Will start with low dose Zoloft (25 > 50mg ) and titrate up over the next several weeks, recommend follow up in approximately 2 weeks to assess and evaluate symptoms further. Could discuss starting behavioral therapy at that time.   Diabetes type 2, uncontrolled ([place]) Restarted on Januvia and glipizide in 07/2019 with a significant improvement in his A1c to 7.4 today (previously 10.9).   HTN (  hypertension) Not currently on any hypertensive therapy.  Appears to be elevated consistently.  Would recommend discussing starting an ACE/ARB given concurrent diabetes in follow-up visits.    While I have a low concern for a cardiac etiology of his symptoms, understandably realize he has numerous risk factors including diabetes,  hyperlipidemia, hypertension, smoking history, and obesity. Obtained a new A1c (which fortunately was greatly improved) and lipid panel.  He is not currently on a statin, should at least on moderate intensity statin and will discuss this when his results return/follow-up visit.  Encouraged him to make sure he follows up on a regular basis for his multiple comorbidities.  Could consider a cardiology referral in the future for stress testing if change in symptoms/not improving with appropriate therapies.   Case discussed with Dr. Walker Kehr.  Follow-up in 2 weeks for above or sooner as needed  [place] Resident PGY-2

## 2019-12-20 NOTE — Patient Instructions (Addendum)
It was wonderful to see you today.  We are going to start you on a medication to help with your anxiety.  You can start with a half a tablet for several days and then increase to 1 tablet a day.  I would like you to follow-up in approximately 2 weeks to see how you are doing and discuss your diabetes.

## 2019-12-21 LAB — LIPID PANEL
Chol/HDL Ratio: 5.1 ratio — ABNORMAL HIGH (ref 0.0–5.0)
Cholesterol, Total: 233 mg/dL — ABNORMAL HIGH (ref 100–199)
HDL: 46 mg/dL
LDL Chol Calc (NIH): 118 mg/dL — ABNORMAL HIGH (ref 0–99)
Triglycerides: 394 mg/dL — ABNORMAL HIGH (ref 0–149)
VLDL Cholesterol Cal: 69 mg/dL — ABNORMAL HIGH (ref 5–40)

## 2019-12-21 LAB — TSH: TSH: 1.83 u[IU]/mL (ref 0.450–4.500)

## 2019-12-24 ENCOUNTER — Emergency Department (HOSPITAL_COMMUNITY)
Admission: EM | Admit: 2019-12-24 | Discharge: 2019-12-24 | Disposition: A | Discharge status: Home / Self Care | Payer: Medicaid Other | Attending: Emergency Medicine | Admitting: Emergency Medicine

## 2019-12-24 ENCOUNTER — Emergency Department (HOSPITAL_COMMUNITY): Payer: Medicaid Other

## 2019-12-24 ENCOUNTER — Other Ambulatory Visit: Payer: Self-pay

## 2019-12-24 ENCOUNTER — Encounter (HOSPITAL_COMMUNITY): Payer: Self-pay | Admitting: *Deleted

## 2019-12-24 ENCOUNTER — Encounter: Payer: Self-pay | Admitting: Family Medicine

## 2019-12-24 DIAGNOSIS — F329 Major depressive disorder, single episode, unspecified: Secondary | ICD-10-CM | POA: Diagnosis not present

## 2019-12-24 DIAGNOSIS — F419 Anxiety disorder, unspecified: Secondary | ICD-10-CM | POA: Insufficient documentation

## 2019-12-24 DIAGNOSIS — F418 Other specified anxiety disorders: Secondary | ICD-10-CM | POA: Diagnosis not present

## 2019-12-24 DIAGNOSIS — Z79899 Other long term (current) drug therapy: Secondary | ICD-10-CM | POA: Insufficient documentation

## 2019-12-24 DIAGNOSIS — Z87891 Personal history of nicotine dependence: Secondary | ICD-10-CM | POA: Diagnosis not present

## 2019-12-24 DIAGNOSIS — F068 Other specified mental disorders due to known physiological condition: Secondary | ICD-10-CM | POA: Insufficient documentation

## 2019-12-24 DIAGNOSIS — E119 Type 2 diabetes mellitus without complications: Secondary | ICD-10-CM | POA: Diagnosis not present

## 2019-12-24 DIAGNOSIS — R0789 Other chest pain: Secondary | ICD-10-CM | POA: Diagnosis present

## 2019-12-24 DIAGNOSIS — Z7984 Long term (current) use of oral hypoglycemic drugs: Secondary | ICD-10-CM | POA: Insufficient documentation

## 2019-12-24 DIAGNOSIS — I10 Essential (primary) hypertension: Secondary | ICD-10-CM | POA: Diagnosis not present

## 2019-12-24 DIAGNOSIS — F32A Depression, unspecified: Secondary | ICD-10-CM

## 2019-12-24 DIAGNOSIS — R079 Chest pain, unspecified: Secondary | ICD-10-CM | POA: Diagnosis not present

## 2019-12-24 LAB — BASIC METABOLIC PANEL
Anion gap: 11 (ref 5–15)
BUN: 16 mg/dL (ref 6–20)
CO2: 23 mmol/L (ref 22–32)
Calcium: 8.8 mg/dL — ABNORMAL LOW (ref 8.9–10.3)
Chloride: 103 mmol/L (ref 98–111)
Creatinine, Ser: 0.78 mg/dL (ref 0.61–1.24)
GFR calc Af Amer: >60 mL/min (ref >60)
GFR calc non Af Amer: >60 mL/min (ref >60)
Glucose, Bld: 170 mg/dL — ABNORMAL HIGH (ref 70–99)
Potassium: 4.1 mmol/L (ref 3.5–5.1)
Sodium: 137 mmol/L (ref 135–145)

## 2019-12-24 LAB — CBC
HCT: 40.2 % (ref 39.0–52.0)
Hemoglobin: 13.9 g/dL (ref 13.0–17.0)
MCH: 30.8 pg (ref 26.0–34.0)
MCHC: 34.6 g/dL (ref 30.0–36.0)
MCV: 88.9 fL (ref 80.0–100.0)
Platelets: 201 10*3/uL (ref 150–400)
RBC: 4.52 MIL/uL (ref 4.22–5.81)
RDW: 11.9 % (ref 11.5–15.5)
WBC: 9.7 10*3/uL (ref 4.0–10.5)
nRBC: 0 % (ref 0.0–0.2)

## 2019-12-24 LAB — TROPONIN I (HIGH SENSITIVITY): Troponin I (High Sensitivity): 9 ng/L (ref <18)

## 2019-12-24 IMAGING — CR DG CHEST 2V
2 series · 2 of 2 positions shown · non-contrast
Comparison: [DATE]

CLINICAL DATA: Chest pain for 3-4 hours.

EXAM:
CHEST - 2 VIEW

[chest pa]
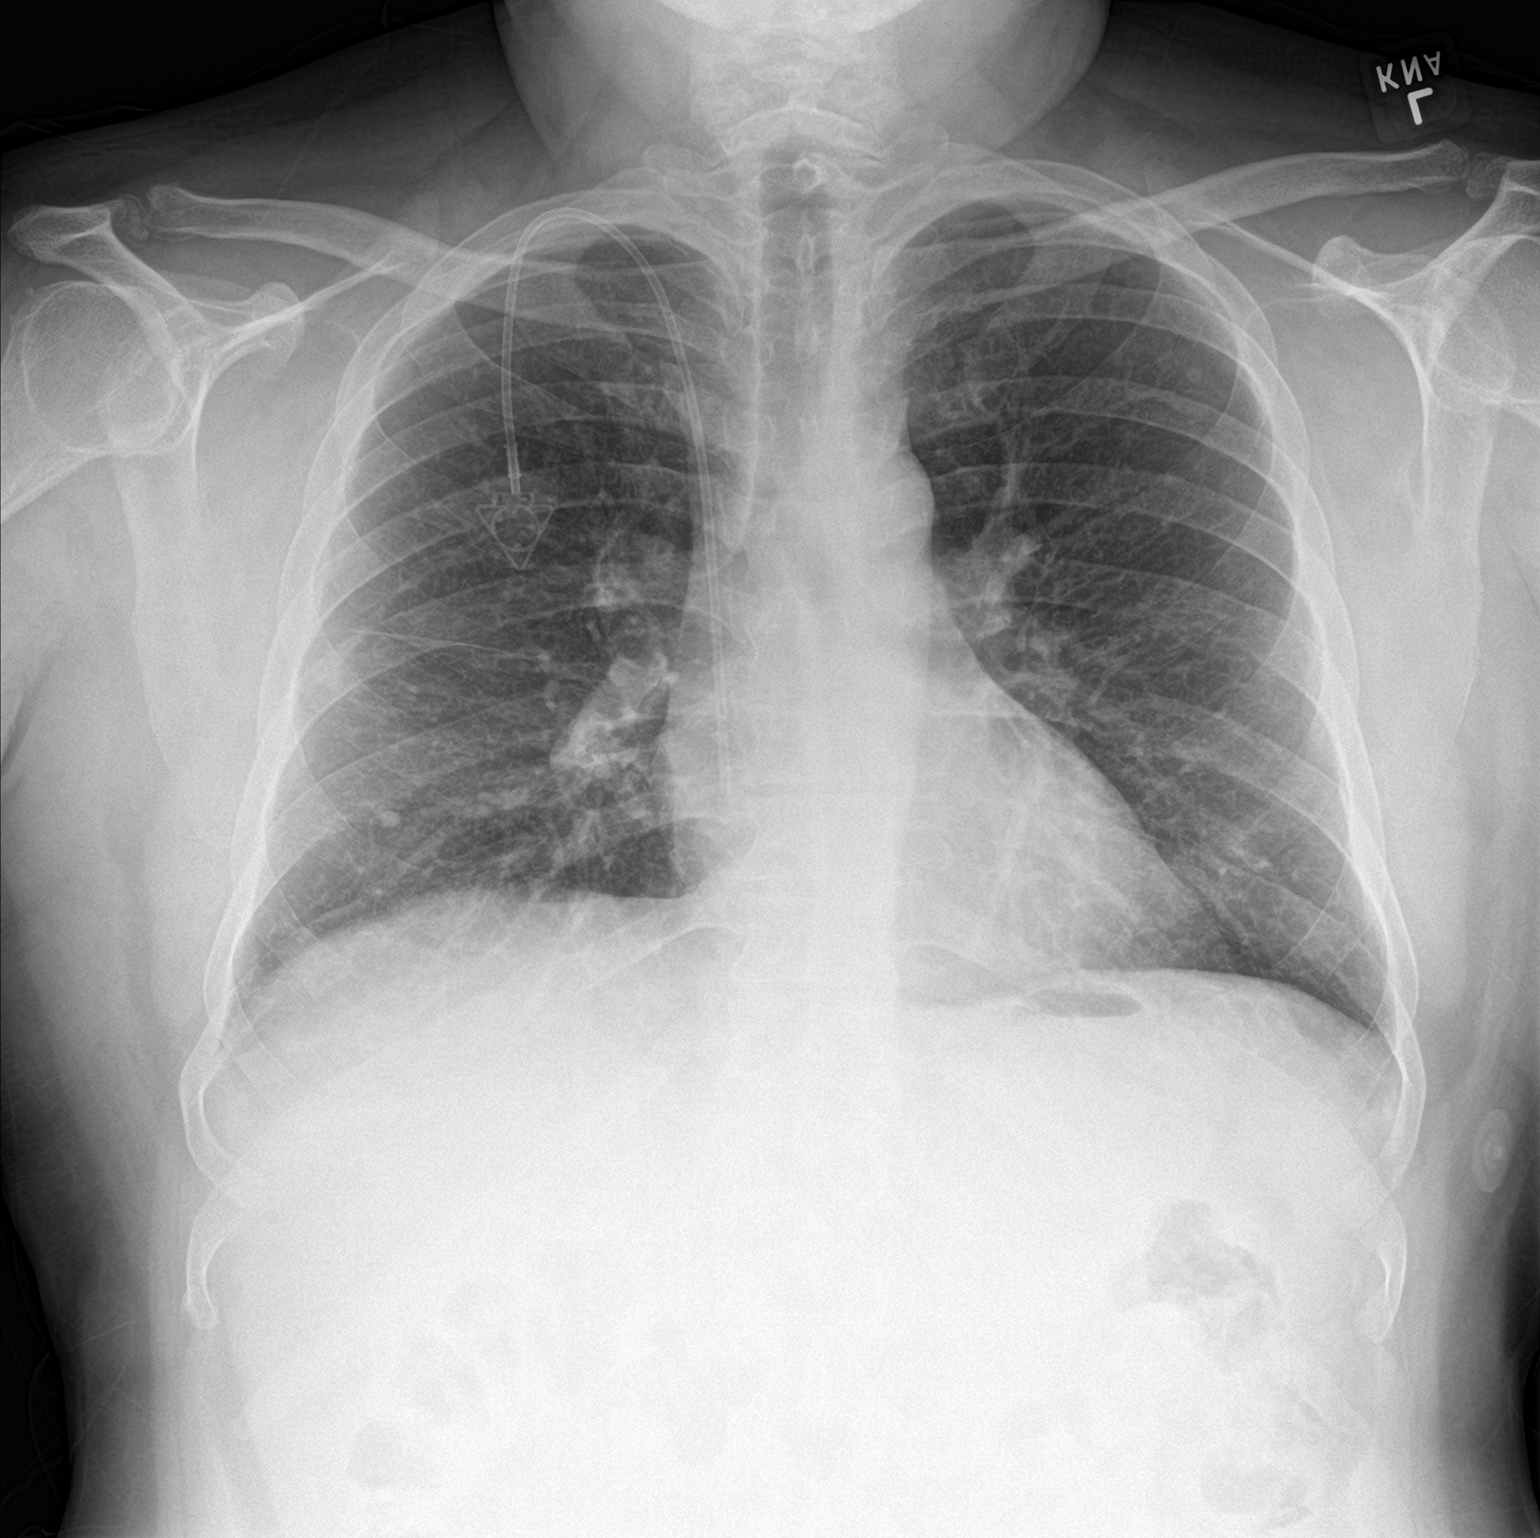

[chest lat]
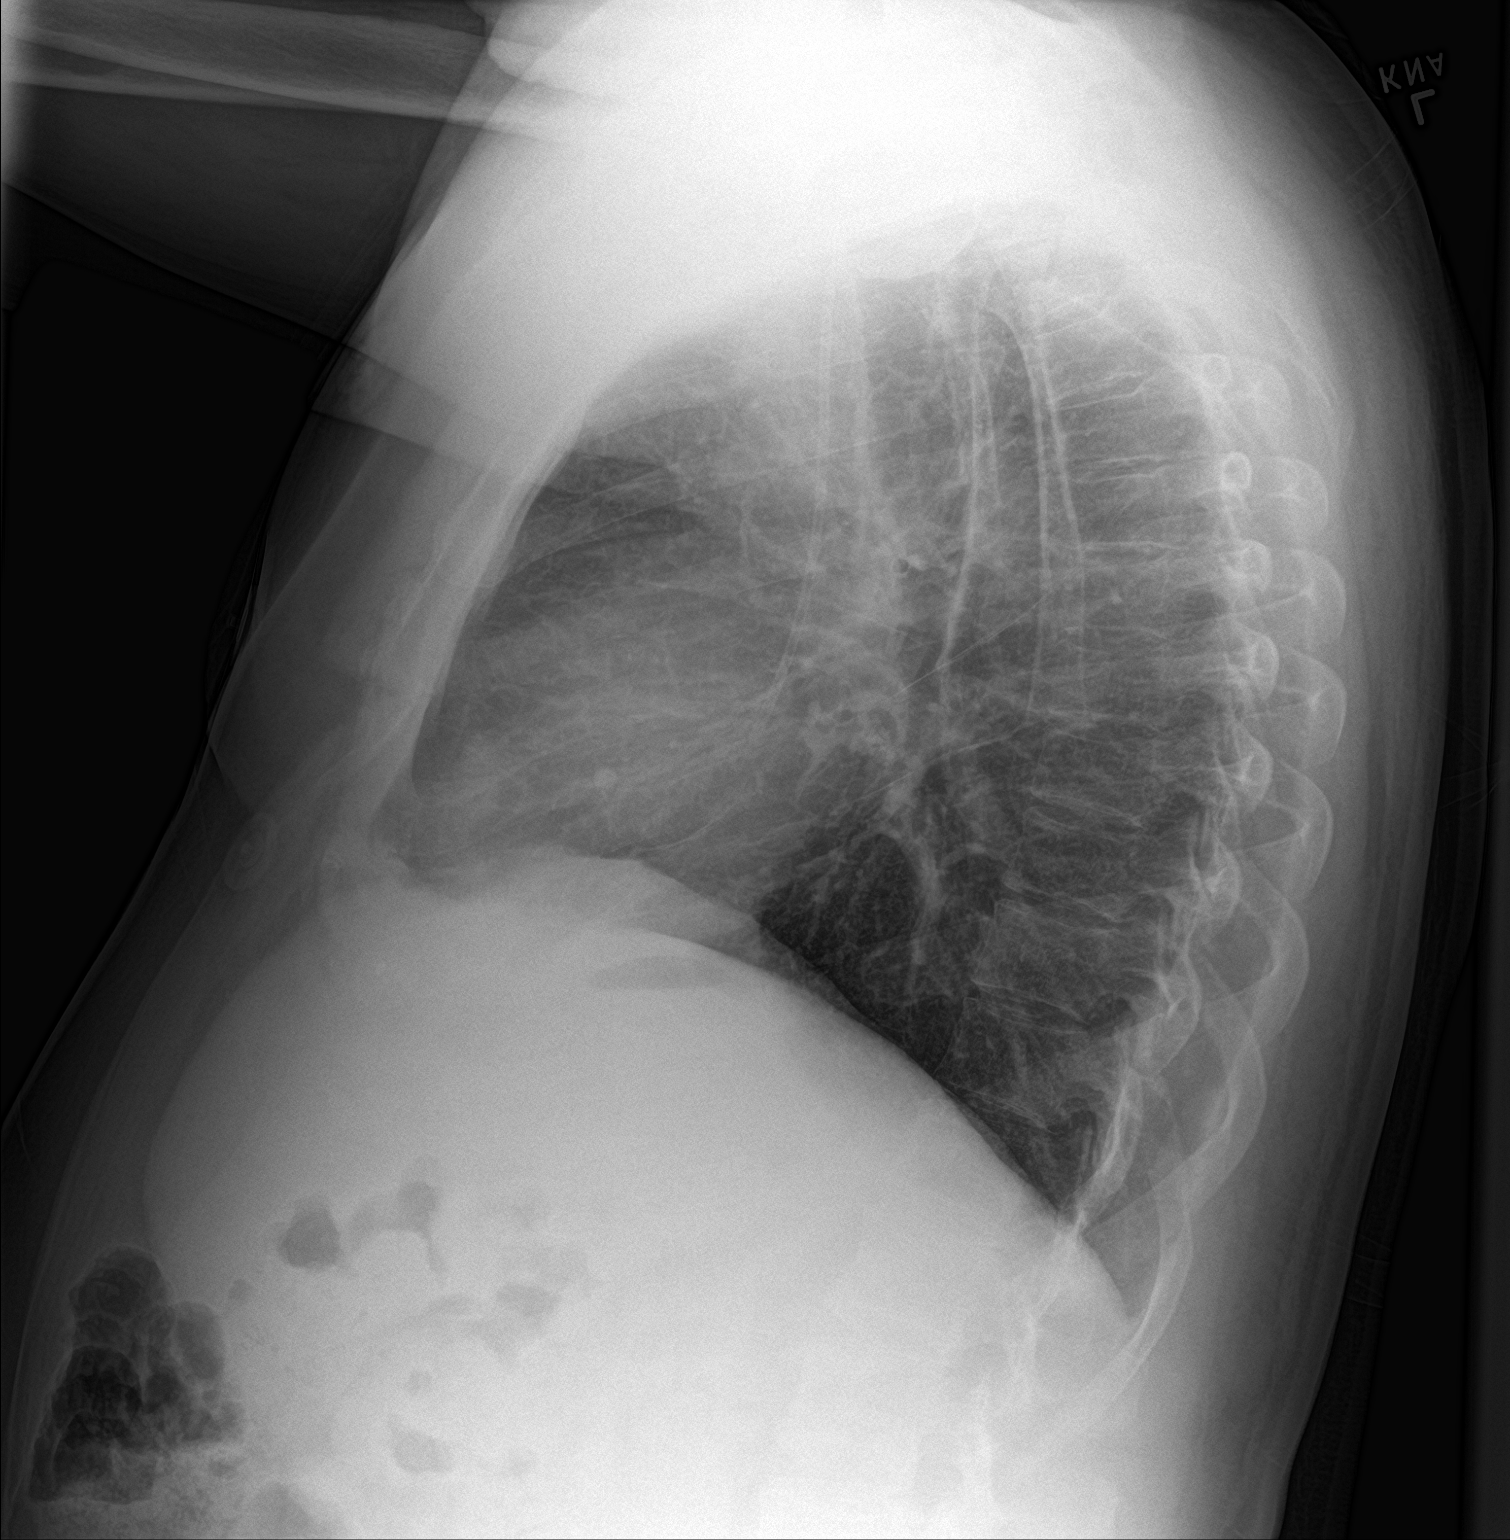

[2 of 2 positions shown; findings below may reference images not displayed]

FINDINGS: Stable position of the right-sided injectable port.

Cardiomediastinal silhouette is normal. Mediastinal contours appear
intact.

There is no evidence of focal airspace consolidation, pleural
effusion or pneumothorax. Stable less than 1 cm right lower lung
granuloma.

Osseous structures are without acute abnormality. Soft tissues are
grossly normal.
IMPRESSION: No active cardiopulmonary disease.

## 2019-12-24 MED ORDER — HYDROXYZINE HCL 25 MG PO TABS: 25.0000 mg | ORAL_TABLET | Freq: Four times a day (QID) | ORAL | 0 refills | Status: DC

## 2019-12-24 MED ORDER — SODIUM CHLORIDE 0.9% FLUSH: 3.0000 mL | Freq: Once | INTRAVENOUS | Status: DC

## 2019-12-24 MED ORDER — LORAZEPAM 1 MG PO TABS
0.5000 mg | ORAL_TABLET | Freq: Once | ORAL | Status: AC
  Administered 2019-12-24: 20:00:00 0.5 mg via ORAL
  Filled 2019-12-24: qty 1

## 2019-12-24 NOTE — ED Triage Notes (Signed)
The pt saw someone in the past 3-4 days and was given some medicine that he has not taken

## 2019-12-24 NOTE — Assessment & Plan Note (Signed)
Restarted on Januvia and glipizide in 07/2019 with a significant improvement in his A1c to 7.4 today (previously 10.9).

## 2019-12-24 NOTE — ED Triage Notes (Signed)
Chest pain for 3-4 hours no sob no nausea   Hx of chest pain  Is seen in the ed for these episodes

## 2019-12-24 NOTE — Discharge Instructions (Signed)
Take your Zoloft daily as prescribed.  Do not miss doses of this medication.  It is only effective if taken every day.  May also use hydroxyzine as needed up to 3 times a day for anxiety.  Please return to ED if you have chest pain or shortness of breath, feel sweaty or have any new or concerning symptoms.

## 2019-12-24 NOTE — Assessment & Plan Note (Addendum)
Episodic, appears to be related to fear of cancer returning (history of follicular lymphoma and right colon cancer s/p colectomy/chemo in 2019/2020). Difficult to obtain a clear understanding of his entire symptomatology due to language barrier despite use of translator. Considered underlying CAD/ACS especially as he endorses feeling "anxious on the left side of his chest," but suspect this is related to palpitations and otherwise would be a very atypical presentation. Reassuringly EKG NSR, low concern for precipitating arrhythmia but could continue to remain on differential. Will rule out thyroid abnormality with TSH today.  Will start with low dose Zoloft (25 > 50mg ) and titrate up over the next several weeks, recommend follow up in approximately 2 weeks to assess and evaluate symptoms further. Could discuss starting behavioral therapy at that time.

## 2019-12-24 NOTE — ED Provider Notes (Signed)
[facility] EMERGENCY DEPARTMENT Provider Note   CSN: [id number] Arrival date & time: 12/24/19  1700     History Chief Complaint  Patient presents with  . Chest Pain    Blake Vaughn is a 59 y.o. male. Spanish Language interpreter used for the entirety of this patient visit Blake Vaughn [id number] HPI  Patient is a Spanish speaking 59 year old male presented today for despair and anxiety.  Triage he was misunderstood and the sense of despair and anxiety that he feels in his chest was interpreted to be chest pain.  For this reason he had lab work drawn.  For me he describes a sense of anxiety and despair has been ongoing for some time for him worsening over the past week.  He was seen by family medicine several days ago and prescribed Zoloft which she states he has taken only once because he is taking it as needed for his despair.  He denies any chest pain, discomfort, tightness, shortness of breath, nausea, diaphoresis lightheadedness or dizziness.  States he feels quite well apart from feeling down depressed and dispirited.      Past Medical History:  Diagnosis Date  . Appendicitis 10/21/2018  . Diabetes mellitus   . 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 GI bleed 11/05/2018  . Hypercholesterolemia   . Hypertension 11/27/2011  . Lymphoma [facility])    gets annual chemo last tx Feb 2013  . TOBACCO USE, QUIT 08/18/2009   Qualifier: Diagnosis of  By: Drue Flirt  MD, Taineisha      Patient Active Problem List   Diagnosis Date Noted  . 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 12/24/2019  . Right shoulder pain 08/16/2019  . Bilateral shoulder pain 08/02/2019  . Neuropathy 07/30/2019  . Port-A-Cath in place 11/25/2018  . Cancer of right colon ([place]) 11/12/2018  . Syncope due to orthostatic hypotension 11/05/2018  . Hypoalbuminemia 11/05/2018  . Hypoproteinemia ([place]) 11/05/2018  . Uses Spanish as primary  spoken language 10/23/2018  . Obesity (BMI 30-39.9) 10/23/2018  . Mass of cecum s/p right colectomy 10/21/2018 10/22/2018  . Nocturnal leg cramps 07/16/2017  . HTN (hypertension) 11/27/2011  . Pure hypercholesterolemia 09/24/2009  . 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 Diabetes type 2, uncontrolled ([place]) 07/09/2009  . 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 ESOPHAGOGASTRODUODENOSCOPY (EGD) WITH PROPOFOL N/A 11/05/2018   Procedure: ESOPHAGOGASTRODUODENOSCOPY (EGD) WITH PROPOFOL;  Surgeon: [name], MD;  Location: [facility];  Service: Endoscopy;  Laterality: N/A;  . IR IMAGING GUIDED PORT INSERTION  11/24/2018  . LAPAROSCOPIC APPENDECTOMY N/A 10/21/2018   Procedure: Exploratory laparotomy right hemicolectomy;  Surgeon: [name], MD;  [facility];  Service: General;  Laterality: N/A;       Family History  Problem Relation Age of Onset  . Diabetes Mother   . Diabetes Father   . Renal Disease Brother   . Diabetes Brother   . Cancer Brother        lymphoma     Social History   Tobacco Use  . Smoking status: Former Smoker    Packs/day: 0.50    Years: 30.00    Pack years: 15.00    Quit date: 12/08/2004    Years since quitting: 15.0  . Smokeless tobacco: Former Systems developer    Quit date: 11/20/2006  Substance Use Topics  .  Alcohol use: No  . Drug use: No    Home Medications Prior to Admission medications   Medication Sig Start Date End Date Taking? Authorizing Provider  Accu-Chek Softclix Lancets lancets Use as instructed 07/30/19   [name], [name], DO  diphenhydrAMINE (BENADRYL) 50 MG tablet Take 1 tablet (50 mg total) by mouth as directed. Take Benadryl 50 mg   1 hr  Before  CT scans. 08/30/19   [name], MD  gabapentin (NEURONTIN) 300 MG capsule Take 1 capsule (300 mg total) by mouth 3 (three) times daily. 07/25/19 08/24/19  [name], [name], DO    glipiZIDE (GLUCOTROL XL) 5 MG 24 hr tablet Take 1 tablet (5 mg total) by mouth 2 (two) times daily. 07/25/19   [name], [name], DO  glucose blood (ACCU-CHEK AVIVA PLUS) test strip Use as instructed 07/30/19   [name], [name], DO  hydrOXYzine (ATARAX/VISTARIL) 25 MG tablet Take 1 tablet (25 mg total) by mouth every 6 (six) hours. 12/24/19   Tedd Sias, PA  lidocaine-prilocaine (EMLA) cream Apply to affected area once 11/25/18   [name], MD  lidocaine-prilocaine (EMLA) cream Apply 1 application topically as needed. 12/05/19   [name], MD  metFORMIN (GLUCOPHAGE) 1000 MG tablet Take 1 tablet (1,000 mg total) by mouth 2 (two) times daily with a meal. 07/25/19 10/23/19  [name], [name], DO  predniSONE (DELTASONE) 50 MG tablet Take Prednisone 50 mg  At   13 hr,  7 hr,  And  1 hr  Before  CT  Scans. 06/02/19   [name], MD  predniSONE (DELTASONE) 50 MG tablet Take first tablet 13 hours before CT scan, second tablet 7 hours before CT scan, and third tablet 1 hour before CT scan 12/05/19   [name], MD  prochlorperazine (COMPAZINE) 10 MG tablet Take 1 tablet (10 mg total) by mouth every 6 (six) hours as needed (Nausea or vomiting). 04/21/19   [name], MD  sertraline (ZOLOFT) 50 MG tablet Take 1 tablet (50 mg total) by mouth daily. 12/20/19   Patriciaann Clan, DO  sitaGLIPtin (JANUVIA) 50 MG tablet Take 1 tablet (50 mg total) by mouth daily. 07/25/19   [name], [name], DO    Allergies    Iohexol  Review of Systems   Review of Systems  Constitutional: Negative for chills and fever.  HENT: Negative for congestion.   Eyes: Negative for pain.  Respiratory: Negative for cough and shortness of breath.   Cardiovascular: Negative for chest pain and leg swelling.  Gastrointestinal: Negative for abdominal pain and vomiting.  Genitourinary: Negative for dysuria.  Musculoskeletal: Negative for myalgias.  Skin: Negative for rash.  Neurological: Negative for dizziness and headaches.    Physical  Exam Updated Vital Signs BP (!) 154/81   Pulse 88   Temp 98.2 F (36.8 C) (Oral)   Resp (!) 25   Ht 5\' 5"  (1.651 m)   Wt 88 kg   SpO2 97%   BMI 32.28 kg/m   Physical Exam Vitals and nursing note reviewed.  Constitutional:      General: He is not in acute distress.    Comments: Patient is anxious appearing 59 year old male appears stated age  HENT:     Head: Normocephalic and atraumatic.     Nose: Nose normal.     Mouth/Throat:     Mouth: Mucous membranes are moist.  Eyes:     General: No scleral icterus. Cardiovascular:     Rate and Rhythm: Normal rate and regular rhythm.  Pulses: Normal pulses.     Heart sounds: Normal heart sounds.  Pulmonary:     Effort: Pulmonary effort is normal. No respiratory distress.     Breath sounds: No wheezing.     Comments: Lungs clear to auscultation bilaterally Abdominal:     Palpations: Abdomen is soft.     Tenderness: There is no abdominal tenderness. There is no guarding.     Comments: Protuberant abdomen no tenderness no guarding no rebound  Musculoskeletal:     Cervical back: Normal range of motion. No rigidity.     Right lower leg: No edema.     Left lower leg: No edema.     Comments: No calf tenderness.  Moves all 4 extremities  Skin:    General: Skin is warm and dry.     Capillary Refill: Capillary refill takes less than 2 seconds.  Neurological:     Mental Status: He is alert. Mental status is at baseline.     Comments: Moves all 4 extremities strength 5/5 in all extremities  Psychiatric:        Mood and Affect: Mood normal.        Behavior: Behavior normal.     ED Results / Procedures / Treatments   Labs (all labs ordered are listed, but only abnormal results are displayed) Labs Reviewed  BASIC METABOLIC PANEL - Abnormal; Notable for the following components:      Result Value   Glucose, Bld 170 (*)    Calcium 8.8 (*)    All other components within normal limits  CBC  TROPONIN I (HIGH SENSITIVITY)  TROPONIN  I (HIGH SENSITIVITY)    EKG EKG Interpretation  Date/Time:  Saturday December 24 2019 17:15:04 EST Ventricular Rate:  92 PR Interval:  138 QRS Duration: 90 QT Interval:  348 QTC Calculation: 430 R Axis:   69 Text Interpretation: Normal sinus rhythm Normal ECG When compared to prior, no significant changes seen No STEMI Confirmed by Antony Blackbird [phone]) on 12/24/2019 6:57:47 PM   Radiology DG Chest 2 View  Result Date: 12/24/2019 CLINICAL DATA:  Chest pain for 3-4 hours. EXAM: CHEST - 2 VIEW COMPARISON:  October 10, 2019 FINDINGS: Stable position of the right-sided injectable port. Cardiomediastinal silhouette is normal. Mediastinal contours appear intact. There is no evidence of focal airspace consolidation, pleural effusion or pneumothorax. Stable less than 1 cm right lower lung granuloma. Osseous structures are without acute abnormality. Soft tissues are grossly normal. IMPRESSION: No active cardiopulmonary disease. Electronically Signed   By: Fidela Salisbury M.D.   On: 12/24/2019 17:44    Procedures Procedures (including critical care time)  Medications Ordered in ED Medications  sodium chloride flush (NS) 0.9 % injection 3 mL (has no administration in time range)  LORazepam (ATIVAN) tablet 0.5 mg (0.5 mg Oral Given 12/24/19 1934)    ED Course  I have reviewed the triage vital signs and the nursing notes.  Pertinent labs & imaging results that were available during my care of the patient were reviewed by me and considered in my medical decision making (see chart for details).    MDM Rules/Calculators/A&P                      Due to misunderstanding in triage patient was triaged as chest pain in fact he is experiencing a sensation of despair that he is feeling in his chest but clarified multiple times he was not having any pain in his chest or shortness of  breath no nausea or diaphoresis no symptoms indicative of ACS.  Patient has been seen by family medicine and placed  on Zoloft recently but patient has not taken it yet took x1 because he thought it was a as needed medication.  He states he is feeling anxious and depressed today but denies any SI HI or AVH.  Is well-appearing has no abnormalities on physical exam and history is consistent with history obtained by family medicine resident several days ago.  We will give patient 0.5 mg of Ativan and discharged with hydroxyzine for home use.  Long conversation with patient discussing Zoloft and that it takes several weeks to be effective.  In the meantime he will use Atarax.  Recommend the patient take it daily and missed no doses as it is effective only when taken long-term.  He is understanding of this also discussed side effects of Zoloft.  The medical records were personally reviewed by myself. I personally reviewed all lab results and interpreted all imaging studies and either concurred with their official read or contacted radiology for clarification.   This patient appears reasonably screened and I doubt any other medical condition requiring further workup, evaluation, or treatment in the ED at this time prior to discharge.   Patient's vitals are WNL apart from vital sign abnormalities discussed above, patient is in NAD, and able to ambulate in the ED at their baseline and able to tolerate PO.  Pain has been managed or a plan has been made for home management and has no complaints prior to discharge. Patient is comfortable with above plan and for discharge at this time. All questions were answered prior to disposition. Results from the ER workup discussed with the patient face to face and all questions answered to the best of my ability. The patient is safe for discharge with strict return precautions. Patient appears safe for discharge with appropriate follow-up. Conveyed my impression with the patient and they voiced understanding and are agreeable to plan.   An After Visit Summary was printed and given to the  patient.  Portions of this note were generated with Lobbyist. Dictation errors may occur despite best attempts at proofreading.    Final Clinical Impression(s) / ED Diagnoses Final diagnoses:  Anxiety  Depression, unspecified depression type    Rx / DC Orders ED Discharge Orders         Ordered    hydrOXYzine (ATARAX/VISTARIL) 25 MG tablet  Every 6 hours     12/24/19 1923           Tedd Sias, [place] 12/24/19 2013    Tegeler, Gwenyth Allegra, MD 12/24/19 2053

## 2019-12-24 NOTE — ED Notes (Signed)
Patient verbalizes understanding of discharge instructions. Opportunity for questioning and answers were provided. Armband removed by staff, pt discharged from ED. Ambulatory by self

## 2019-12-24 NOTE — Assessment & Plan Note (Signed)
Not currently on any hypertensive therapy.  Appears to be elevated consistently.  Would recommend discussing starting an ACE/ARB given concurrent diabetes in follow-up visits.

## 2019-12-28 ENCOUNTER — Telehealth: Payer: Self-pay | Admitting: Family Medicine

## 2019-12-28 NOTE — Telephone Encounter (Signed)
Call patient with Spanish interpreter to discuss results from last week's visit.  Also note he went to the ED for the same complaint on 1/16, started on Atarax in addition to his Zoloft.  Seems to be doing better.  Discussed significant improvement in A1c and congratulated him on his efforts.  Noted hyperlipidemia with mild increase in LDL and triglyceridemia.  Through chart review, can see numerous times where it states he is on statin and aspirin therapy, however it is not listed under his current medications.  He is unaware if he is taking these medicines, states "I take quite a few medicines."  He is not home and cannot look at his bottles right now.  ASCVD 10-year risk 25%, should be on a high intensity statin and aspirin 81 mg, especially with concurrent diabetes.  Suspect if he has this at home he likely has not been taking it as evident by his lipid panel that is largely unchanged from his previous one several years ago.  Instructed him to bring all of his medications with him at his follow-up appointment on 1/26 with Dr. Ouida Sills.  If he does not have a statin, recommend starting Crestor during this visit after discussion with the patient if able.  Baseline AST/ALT normal on recent CMP in 11/2019.   Patriciaann Clan, DO

## 2019-12-29 ENCOUNTER — Other Ambulatory Visit: Payer: Self-pay | Admitting: Family Medicine

## 2020-01-02 NOTE — Progress Notes (Signed)
   Subjective:    Patient ID: Blake Vaughn, male    DOB: Sep 28, 1961, 59 y.o.   MRN: [id number]   CC: f/u diabetes, anxiety/depression  HPI: Diabetes: Patient's last HbA1c improved to 7.4% on 12/20/2019.  Patient is taking Metformin 1000 mg twice daily with meals, glipizide 5 daily and Januvia/sitagliptin 50 mg daily.  He is not currently checking his blood sugar because he does not have a glucometer.  Denies any episodes of shaking, nausea, vomiting, and feeling sweaty.  He does have some neuropathy which could be due to his diabetes, also could be due to patient's previous chemotherapy.  Foot exam performed today.  Patient denies headaches, vision changes, chest pain, shortness of breath, nausea, vomiting, and abdominal pain.  ASCVD risk: Patient's ASCVD risk 25%.  His most recent lipid panel from December 20, 2019 showed elevated cholesterol, triglycerides, and LDL.  HDL was normal at 46.  Patient stated at last appointment that he thinks he is taking a statin for his cholesterol.  Today, he reports he is not taking that medication.  He would benefit from a statin medication as well as aspirin.  Patient is reluctantly in agreement with starting statin today, but not aspirin.  Smoking status reviewed - former smoker, quit 2006  Review of Systems - see HPI   Objective:  BP 128/64   Pulse 78   SpO2 98%  Vitals and nursing note reviewed  PHYSICAL EXAM General: well nourished, in no acute distress,  Cardiac: RRR, clear S1 and S2, no murmurs, rubs, or gallops Respiratory: clear to auscultation bilaterally, no increased work of breathing Abdomen: soft, nontender, nondistended, no masses or organomegaly. Bowel sounds present Extremities: no edema or cyanosis. Warm, well perfused. 2+ radial and PT pulses bilaterally Skin: warm and dry, no rashes noted Neuro: alert and oriented, no focal deficits   Assessment & Plan:   Need for immunization against influenza -Patient in agreement to  receive flu vaccination today  Diabetes type 2, uncontrolled ([place]) Patient's most recent HbA1c on 12/20/2019 improved at 7.4%.  Goal A1c for this patient is about 7. -Continue Metformin 1000 mg twice daily -Continue sitagliptin 50 mg daily -Continue glipizide 5 mg daily -Patient started on atorvastatin for increased ASCVD risk, respectfully declined to start aspirin -Plan to follow-up with patient in April 2021 for diabetes check, repeat HbA1c, follow-up for any side effects on atorvastatin.  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   Dr. Milus [facility], PGY-2

## 2020-01-03 ENCOUNTER — Encounter: Payer: Self-pay | Admitting: Family Medicine

## 2020-01-03 ENCOUNTER — Ambulatory Visit (INDEPENDENT_AMBULATORY_CARE_PROVIDER_SITE_OTHER): Payer: Medicaid Other | Admitting: Family Medicine

## 2020-01-03 ENCOUNTER — Other Ambulatory Visit: Payer: Self-pay

## 2020-01-03 VITALS — BP 128/64 | HR 78

## 2020-01-03 DIAGNOSIS — F068 Other specified mental disorders due to known physiological condition: Secondary | ICD-10-CM

## 2020-01-03 DIAGNOSIS — Z23 Encounter for immunization: Secondary | ICD-10-CM | POA: Diagnosis not present

## 2020-01-03 DIAGNOSIS — E1165 Type 2 diabetes mellitus with hyperglycemia: Secondary | ICD-10-CM

## 2020-01-03 MED ORDER — ATORVASTATIN CALCIUM 40 MG PO TABS: 40.0000 mg | ORAL_TABLET | Freq: Every day | ORAL | 3 refills | Status: DC

## 2020-01-03 MED ORDER — ACCU-CHEK AVIVA PLUS W/DEVICE KIT: 1.0000 | PACK | Freq: Two times a day (BID) | 0 refills | Status: DC

## 2020-01-03 MED ORDER — METFORMIN HCL 1000 MG PO TABS: 1000.0000 mg | ORAL_TABLET | Freq: Two times a day (BID) | ORAL | 3 refills | Status: DC

## 2020-01-03 NOTE — Assessment & Plan Note (Signed)
-  Patient in agreement to receive flu vaccination today

## 2020-01-03 NOTE — Assessment & Plan Note (Signed)
-  Patient to continue Atarax 25 mg every 6 hours as needed for anxiety and Zoloft 50 mg daily

## 2020-01-03 NOTE — Patient Instructions (Signed)
Thank you for coming in to see Blake Vaughn today! Please see below to review our plan for today's visit:  1. We are starting you on Atorvastatin (Lipitor) to help prevent Heart Attack and Stroke. Take one tablet of Lipitor every day at night. 2. We will see you in April 2021!   Please call the clinic at [phone] if your symptoms worsen or you have any concerns. It was our pleasure to serve you!  Dr. Milus Banister [facility]  Atorvastatin tablets Qu es este medicamento? La ATORVASTATINA se conoce como un inhibidor de la HMG-CoA reductasa o 'estatina'. Disminuye el nivel de colesterol y triglicridos en la sangre. Este frmaco tambin puede reducir el riesgo de ataque cardiaco, derrame cerebral, u otros problemas de salud en pacientes con factores de riesgo para enfermedad cardiaca. Este frmaco se [place] con frecuencia en combinacin con una dieta y cambios en el estilo de vida. Este medicamento puede ser utilizado para otros usos; si tiene alguna pregunta consulte con su proveedor de atencin mdica o con su farmacutico. MARCAS COMUNES: Lipitor Qu le debo informar a mi profesional de la salud antes de tomar este medicamento? Necesitan saber si usted presenta alguno de los [employer] o situaciones: diabetes si bebe alcohol con frecuencia antecedentes de accidente cerebrovascular enfermedad renal enfermedad heptica dolores o debilidades musculares enfermedad tiroidea una reaccin alrgica o inusual a la atorvastatina, a otros medicamentos, alimentos, colorantes o conservantes si est embarazada o buscando quedar embarazada si est amamantando a un beb Cmo debo utilizar este medicamento? Tome este medicamento por va oral con un vaso de agua. Siga las instrucciones de la etiqueta del [place]. Puede tomarlo con o sin alimentos. Si el Transport planner, tmelo con alimentos. No lo tome con jugo de toronja. Tome su medicamento a intervalos  regulares. No lo tome con una frecuencia mayor a la indicada. No deje de tomarlo, excepto si as lo indica su mdico. Hable con su pediatra para informarse acerca del uso de este medicamento en nios. Aunque este medicamento se puede recetar a nios tan pequeos como de 10 aos de edad con ciertas afecciones, existen precauciones que deben tomarse. Sobredosis: Pngase en contacto inmediatamente con un centro toxicolgico o una sala de urgencia si usted cree que haya tomado demasiado medicamento. ATENCIN: [employer] es solo para usted. No comparta este medicamento con nadie. Qu sucede si me olvido de una dosis? Si olvida una dosis, tmela lo antes posible. Si debe tomar su prxima dosis en menos de 12 horas, entonces no tome la dosis que olvid. Tome la prxima dosis a la hora habitual. No tome dosis adicionales o dobles. Qu puede interactuar con este medicamento? No use este medicamento con ninguno de los siguientes frmacos: dasabuvir; ombitasvir; paritaprevir; ritonavir ombitasvir; paritaprevir; ritonavir posaconazol arroz de levadura roja Este medicamento tambin puede interactuar con los siguientes frmacos: alcohol pldoras anticonceptivas ciertos antibiticos, tales como eritromicina y claritromicina ciertos medicamentos antivirales para VIH o hepatitis ciertos medicamentos para el colesterol, tales como fenofibrato, gemfibrozil y niacina ciertos medicamentos para infecciones micticas, tales como itraconazol y ketoconazol colchicina ciclosporina digoxina jugo de toronja (pomelo) rifampicina Puede ser que esta lista no menciona todas las posibles interacciones. Informe a su profesional de [employer]	[place] de [employer] productos a base de hierbas, medicamentos de [place] o suplementos nutritivos que est tomando. Si usted fuma, consume bebidas alcohlicas o si utiliza drogas ilegales, indqueselo tambin a su profesional de [employer]	[place]. Algunas sustancias pueden interactuar con su  medicamento. A qu debo estar atento al usar [employer]? Visite a su mdico o a su profesional de la salud para que revise su evolucin peridicamente. Es posible que necesite realizarse pruebas peridicamente para asegurarse de que el hgado est funcionando en forma correcta. Su profesional de la salud puede indicarle que deje de usar este medicamento si desarrolla problemas musculares. Si sus problemas musculares no desaparecen despus de dejar de usar [employer], contacte a su profesional de [employer]	[place]. No debe quedar embarazada mientras est [employer]. Las mujeres deben informar a su profesional de la salud si estn buscando quedar embarazadas o si creen que podran estar embarazadas. Existe la posibilidad de efectos secundarios graves en un beb sin nacer. Para obtener ms informacin, hable con su profesional de la salud o su farmacutico. No debe Economist a un beb mientras est usando este medicamento. Este medicamento puede aumentar los niveles de Dispensing optician. Pregntele a su profesional de la salud si es Chartered loss adjuster cambios en la dieta o en los medicamentos si usted tiene diabetes. Si va a someterse a una operacin o a otro procedimiento, informe a su mdico que est [employer]. Este frmaco es solo parte de un programa completo para Special educational needs teacher. Su mdico o un dietista puede sugerir una dieta baja en colesterol y en grasas para ayudar. Evite beber alcohol y Copy, y siga un programa de ejercicio fsico adecuado. Este medicamento puede causar una disminucin de la coenzima Q10. Debe asegurarse de recibir suficiente coenzima Q10 mientras toma este medicamento. Converse con su profesional de la salud sobre los alimentos que come y las vitaminas que toma. Qu efectos secundarios puedo tener al [employer] este medicamento? Efectos secundarios que debe informar a su mdico o a Barrister's clerk de la salud tan pronto como sea posible: Ecologist, como erupcin cutnea, comezn/picazn o urticaria, e hinchazn de la cara, los labios o la lengua fiebre dolor en las articulaciones prdida de memoria enrojecimiento, formacin de Nurse, children's, descamacin o distensin de la piel, incluso dentro de la boca signos y sntomas de niveles altos de Location manager en la sangre, tales como tener ms sed o apetito, o tener que orinar con mayor frecuencia que lo habitual. Tambin puede sentirse muy cansado o tener visin borrosa. signos y sntomas de lesin al hgado, como orina amarilla oscura o [place]; sensacin general de estar enfermo o sntomas gripales; dolor leve en la regin abdominal; cansancio o debilidad inusuales; color amarillento de los ojos o la piel signos y sntomas de lesin muscular tales como orina oscura; problemas para orinar o cambios en la cantidad de orina; debilidad o cansancio inusual; dolor muscular o dolor en la espalda o en el costado Efectos secundarios que generalmente no requieren atencin mdica (infrmelos a su mdico o a Barrister's clerk de la salud si persisten o si son molestos): diarrea nuseas dolor estomacal dificultad para dormir Engineer, production ser que esta lista no menciona todos los posibles efectos secundarios. Comunquese a su mdico por asesoramiento mdico [employer] Usted puede informar los efectos secundarios a la FDA por telfono al 1-800-FDA-1088. Dnde debo guardar mi medicina? Mantenga fuera del alcance de los nios. Guarde a una temperatura de entre 20 y 12 grados Celsius (37 y 69 grados Fahrenheit). Deseche todo el medicamento que no haya utilizado despus de la fecha de vencimiento.

## 2020-01-03 NOTE — Assessment & Plan Note (Signed)
Patient's most recent HbA1c on 12/20/2019 improved at 7.4%.  Goal A1c for this patient is about 7. -Continue Metformin 1000 mg twice daily -Continue sitagliptin 50 mg daily -Continue glipizide 5 mg daily -Patient started on atorvastatin for increased ASCVD risk, respectfully declined to start aspirin -Plan to follow-up with patient in April 2021 for diabetes check, repeat HbA1c, follow-up for any side effects on atorvastatin.

## 2020-01-10 MED FILL — LIDOCAINE-PRILOCAINE CREAM: 2.5-2.5 | 30 days supply | Qty: 30 | Fill #0

## 2020-01-10 MED FILL — predniSONE 50 MG TABS: 50 | 1 days supply | Qty: 3 | Fill #0

## 2020-01-16 ENCOUNTER — Other Ambulatory Visit: Payer: Self-pay

## 2020-01-16 ENCOUNTER — Inpatient Hospital Stay: Payer: Medicaid Other | Attending: Hematology

## 2020-01-16 DIAGNOSIS — C182 Malignant neoplasm of ascending colon: Secondary | ICD-10-CM | POA: Diagnosis not present

## 2020-01-16 DIAGNOSIS — Z95828 Presence of other vascular implants and grafts: Secondary | ICD-10-CM

## 2020-01-16 DIAGNOSIS — Z452 Encounter for adjustment and management of vascular access device: Secondary | ICD-10-CM | POA: Diagnosis not present

## 2020-01-16 MED ORDER — HEPARIN SOD (PORK) LOCK FLUSH 100 UNIT/ML IV SOLN
500.0000 [IU] | Freq: Once | INTRAVENOUS | Status: AC | PRN
  Administered 2020-01-16: 10:00:00 500 [IU]
  Filled 2020-01-16: qty 5

## 2020-01-16 MED ORDER — SODIUM CHLORIDE 0.9% FLUSH
10.0000 mL | INTRAVENOUS | Status: DC | PRN
  Administered 2020-01-16: 10 mL
  Filled 2020-01-16: qty 10

## 2020-01-20 ENCOUNTER — Telehealth: Payer: Self-pay | Admitting: Hematology

## 2020-01-20 NOTE — Telephone Encounter (Signed)
Rescheduled appt per MD being on PAL,spoke with pt and he is aware of his new scheduled appt date and time.

## 2020-02-20 ENCOUNTER — Telehealth: Payer: Self-pay | Admitting: Hematology

## 2020-02-20 NOTE — Telephone Encounter (Signed)
Scheduled per sch msg. [place] Free Engineer, maintenance (IT)) called and spoke with patient. Confirmed appt

## 2020-02-29 ENCOUNTER — Other Ambulatory Visit: Payer: Self-pay

## 2020-02-29 ENCOUNTER — Telehealth: Payer: Self-pay

## 2020-02-29 DIAGNOSIS — Z91041 Radiographic dye allergy status: Secondary | ICD-10-CM

## 2020-02-29 MED ORDER — PREDNISONE 50 MG PO TABS: 50.0000 mg | ORAL_TABLET | ORAL | Status: DC

## 2020-02-29 MED ORDER — DIPHENHYDRAMINE HCL 50 MG PO TABS: 50.0000 mg | ORAL_TABLET | ORAL | 0 refills | Status: DC

## 2020-02-29 MED ORDER — PREDNISONE 50 MG PO TABS: ORAL_TABLET | ORAL | 0 refills | Status: DC

## 2020-02-29 MED FILL — predniSONE 50 MG TABS: 50 | 1 days supply | Qty: 3 | Fill #0

## 2020-02-29 NOTE — Telephone Encounter (Signed)
Via [place] interpreter pt instructed to take prednisone 50 mg tablet prior to Ct scan.  13hours prior to scan, 7 hours prior to scan, and 1 hour prior to scan.  He is also to take benadryl 50 mg tablet one hour prior to scan. Rx sent to [facility] op pharmacy.

## 2020-03-01 ENCOUNTER — Encounter (HOSPITAL_COMMUNITY): Payer: Self-pay

## 2020-03-01 ENCOUNTER — Other Ambulatory Visit: Payer: Self-pay

## 2020-03-01 ENCOUNTER — Inpatient Hospital Stay: Payer: Medicaid Other | Attending: Hematology

## 2020-03-01 ENCOUNTER — Ambulatory Visit (HOSPITAL_COMMUNITY)
Admission: RE | Admit: 2020-03-01 | Discharge: 2020-03-01 | Disposition: A | Discharge status: Home / Self Care | Payer: Medicaid Other | Source: Ambulatory Visit | Attending: Hematology | Admitting: Hematology

## 2020-03-01 DIAGNOSIS — D500 Iron deficiency anemia secondary to blood loss (chronic): Secondary | ICD-10-CM

## 2020-03-01 DIAGNOSIS — C189 Malignant neoplasm of colon, unspecified: Secondary | ICD-10-CM | POA: Diagnosis not present

## 2020-03-01 DIAGNOSIS — C182 Malignant neoplasm of ascending colon: Secondary | ICD-10-CM

## 2020-03-01 LAB — CBC WITH DIFFERENTIAL (CANCER CENTER ONLY)
Abs Immature Granulocytes: 0.02 10*3/uL (ref 0.00–0.07)
Basophils Absolute: 0 10*3/uL (ref 0.0–0.1)
Basophils Relative: 0 %
Eosinophils Absolute: 0.1 10*3/uL (ref 0.0–0.5)
Eosinophils Relative: 1 %
HCT: 42 % (ref 39.0–52.0)
Hemoglobin: 14.5 g/dL (ref 13.0–17.0)
Immature Granulocytes: 0 %
Lymphocytes Relative: 13 %
Lymphs Abs: 1.1 10*3/uL (ref 0.7–4.0)
MCH: 30.7 pg (ref 26.0–34.0)
MCHC: 34.5 g/dL (ref 30.0–36.0)
MCV: 89 fL (ref 80.0–100.0)
Monocytes Absolute: 0.2 10*3/uL (ref 0.1–1.0)
Monocytes Relative: 2 %
Neutro Abs: 7.4 10*3/uL (ref 1.7–7.7)
Neutrophils Relative %: 84 %
Platelet Count: 204 10*3/uL (ref 150–400)
RBC: 4.72 MIL/uL (ref 4.22–5.81)
RDW: 12.2 % (ref 11.5–15.5)
WBC Count: 8.8 10*3/uL (ref 4.0–10.5)
nRBC: 0 % (ref 0.0–0.2)

## 2020-03-01 LAB — CMP (CANCER CENTER ONLY)
ALT: 26 U/L (ref 0–44)
AST: 18 U/L (ref 15–41)
Albumin: 4 g/dL (ref 3.5–5.0)
Alkaline Phosphatase: 93 U/L (ref 38–126)
Anion gap: 12 (ref 5–15)
BUN: 16 mg/dL (ref 6–20)
CO2: 23 mmol/L (ref 22–32)
Calcium: 9.2 mg/dL (ref 8.9–10.3)
Chloride: 103 mmol/L (ref 98–111)
Creatinine: 1.01 mg/dL (ref 0.61–1.24)
GFR, Est AFR Am: >60 mL/min (ref >60)
GFR, Estimated: >60 mL/min (ref >60)
Glucose, Bld: 374 mg/dL — ABNORMAL HIGH (ref 70–99)
Potassium: 5.1 mmol/L (ref 3.5–5.1)
Sodium: 138 mmol/L (ref 135–145)
Total Bilirubin: 0.7 mg/dL (ref 0.3–1.2)
Total Protein: 6.7 g/dL (ref 6.5–8.1)

## 2020-03-01 LAB — FERRITIN: Ferritin: 114 ng/mL (ref 24–336)

## 2020-03-01 LAB — IRON AND TIBC
Iron: 88 ug/dL (ref 42–163)
Saturation Ratios: 24 % (ref 20–55)
TIBC: 361 ug/dL (ref 202–409)
UIBC: 273 ug/dL (ref 117–376)

## 2020-03-01 LAB — CEA (IN HOUSE-CHCC): CEA (CHCC-In House): 3.86 ng/mL (ref 0.00–5.00)

## 2020-03-01 IMAGING — CT CT CHEST W/ CM
2 of 5 series · 12 of 36 positions shown, 15 images · IV contrast (APPLIED)
Comparison: Abdomen pelvis CT [DATE] chest CT [DATE].

CLINICAL DATA: Colon cancer.

EXAM:
CT CHEST, ABDOMEN, AND PELVIS WITH CONTRAST
TECHNIQUE: Multidetector CT imaging of the chest, abdomen and pelvis was
performed following the standard protocol during bolus
administration of intravenous contrast.
CONTRAST:  100mL OMNIPAQUE IOHEXOL 300 MG/ML  SOLN

[Series 2: cap with · axial · 0.87mm/px · z∈[-615,-135]mm · 9 of 121 slices shown, 12 images]
[im 13/121  mediastinal]
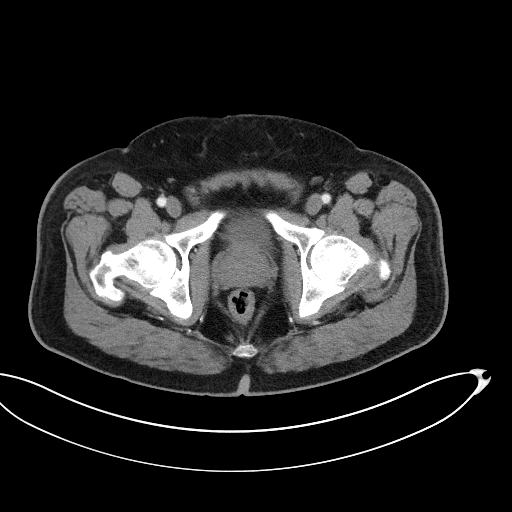
[im 13/121  lung]
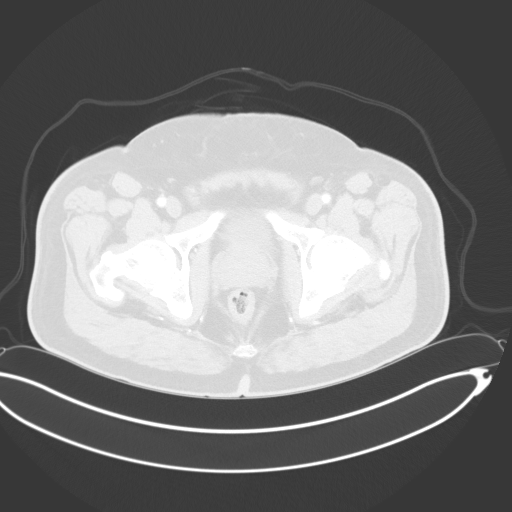
[im 25/121  lung]
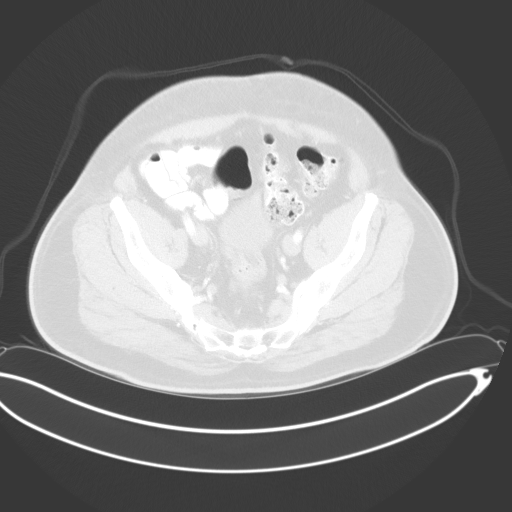
[im 37/121  lung]
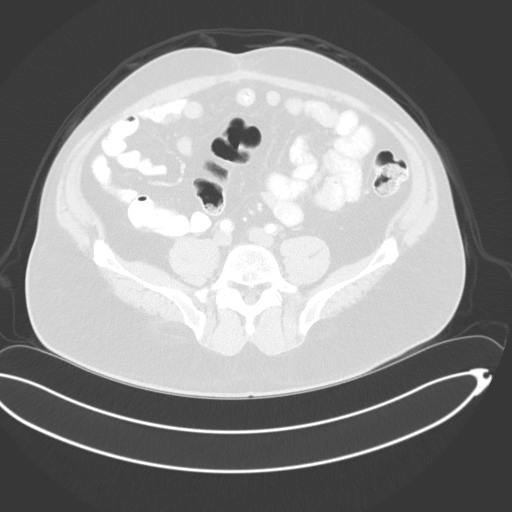
[im 49/121  lung]
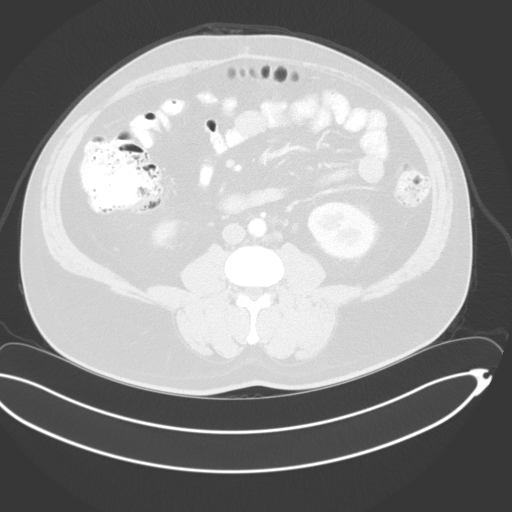
[im 61/121  mediastinal]
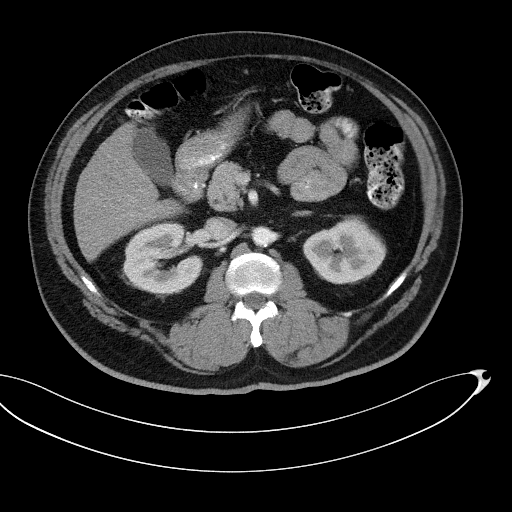
[im 61/121  lung]
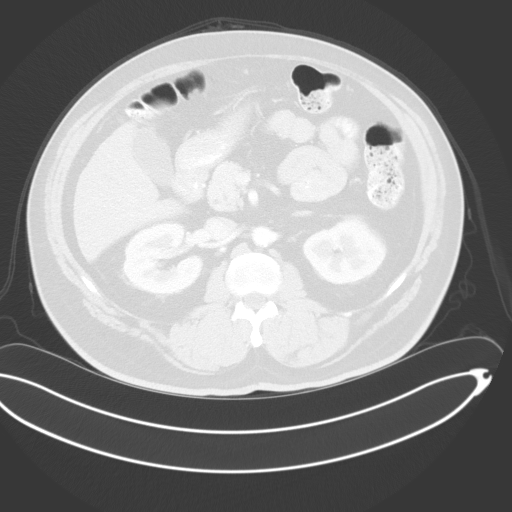
[im 73/121  lung]
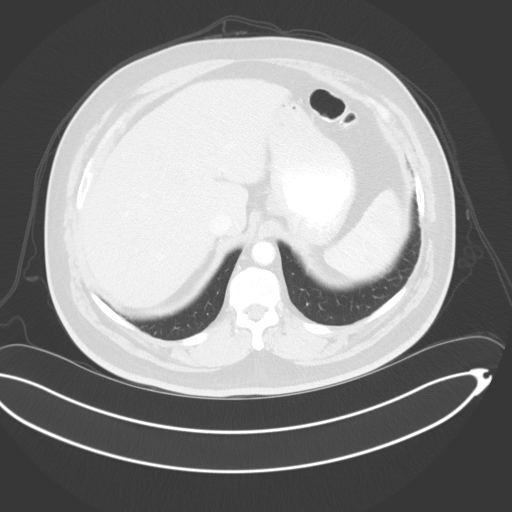
[im 85/121  lung]
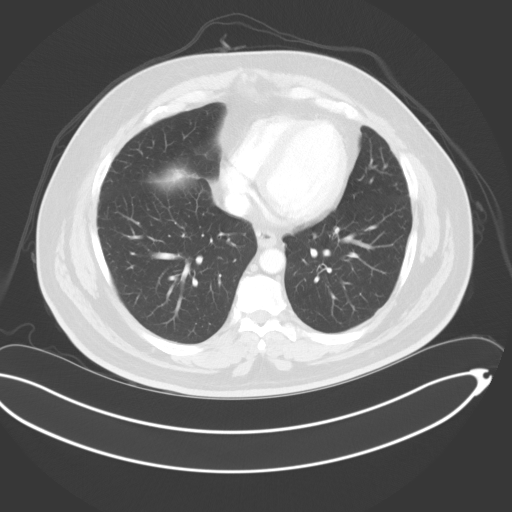
[im 97/121  lung]
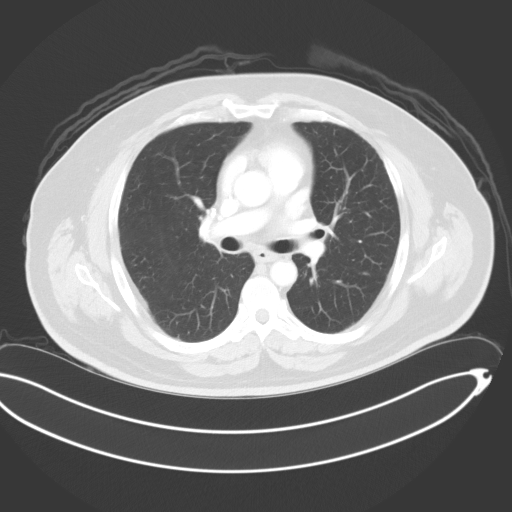
[im 109/121  mediastinal]
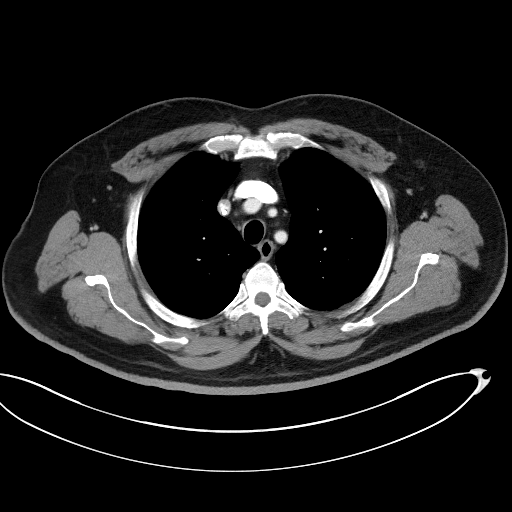
[im 109/121  lung]
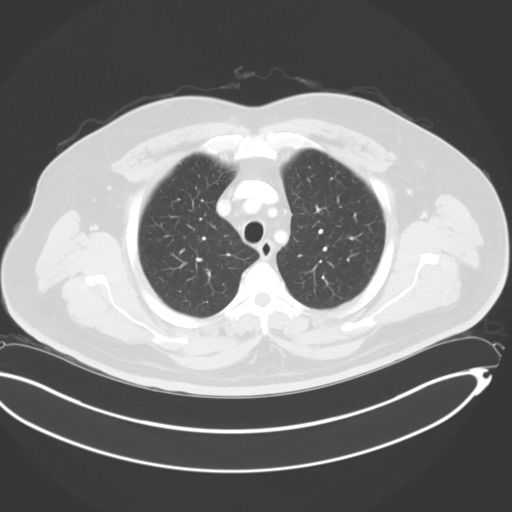

[Series 4: coronals · coronal · 0.82mm/px · 3 of 157 slices shown]
[im 32/157  lung]
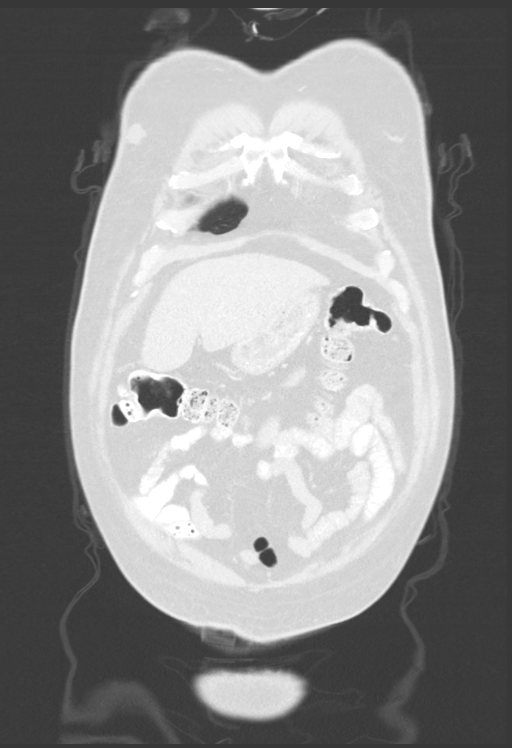
[im 63/157  lung]
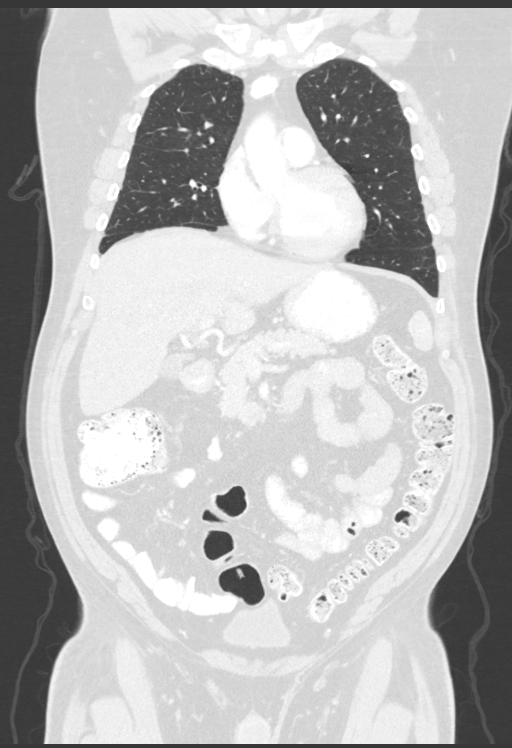
[im 94/157  lung]
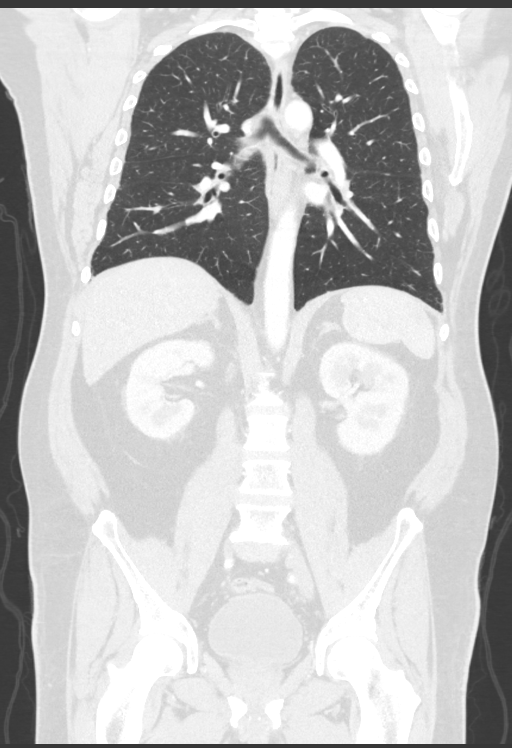

[12 of 36 positions shown; findings below may reference images not displayed]

FINDINGS: CT CHEST FINDINGS

Cardiovascular: The heart size is normal. No substantial pericardial
effusion. Coronary artery calcification is evident. Atherosclerotic
calcification is noted in the wall of the thoracic aorta. Right
Port-A-Cath tip is positioned at the SVC/RA junction.

Mediastinum/Nodes: No mediastinal lymphadenopathy. There is no hilar
lymphadenopathy. The esophagus has normal imaging features. There is
no axillary lymphadenopathy.

Lungs/Pleura: Calcified granuloma noted right middle lobe. Tiny
perifissural nodules in the left lung are stable, compatible with
subpleural lymph nodes. No new suspicious nodule or mass. No focal
airspace consolidation. No pleural effusion.

Musculoskeletal: No worrisome lytic or sclerotic osseous
abnormality.

CT ABDOMEN PELVIS FINDINGS

Hepatobiliary: No suspicious focal abnormality within the liver
parenchyma. There is no evidence for gallstones, gallbladder wall
thickening, or pericholecystic fluid. No intrahepatic or
extrahepatic biliary dilation.

Pancreas: No focal mass lesion. No dilatation of the main duct. No
intraparenchymal cyst. No peripancreatic edema.

Spleen: No splenomegaly. No focal mass lesion.

Adrenals/Urinary Tract: No adrenal nodule or mass. Small exophytic
cyst pole left kidney is similar to prior. No evidence for
hydroureter. The urinary bladder appears normal for the degree of
distention.

Stomach/Bowel: Stomach is unremarkable. No gastric wall thickening.
No evidence of outlet obstruction. Duodenum is normally positioned
as is the ligament of Treitz. No small bowel wall thickening. No
small bowel dilatation. Status post right hemicolectomy No gross
colonic mass. No colonic wall thickening.

Vascular/Lymphatic: There is abdominal aortic atherosclerosis
without aneurysm. There is no gastrohepatic or hepatoduodenal
ligament lymphadenopathy. No retroperitoneal or mesenteric
lymphadenopathy. No pelvic sidewall lymphadenopathy.

Reproductive: The prostate gland and seminal vesicles are
unremarkable.

Other: No intraperitoneal free fluid.

Musculoskeletal: No worrisome lytic or sclerotic osseous
abnormality.
IMPRESSION: 1. Stable exam. No new or progressive findings to suggest recurrent
or metastatic disease.
2. Status post right hemicolectomy.
3. Aortic Atherosclerosis ([UA]-[UA]).

## 2020-03-01 IMAGING — CT CT ABD-PELV W/ CM
2 of 5 series · 12 of 36 positions shown, 15 images · IV contrast (omnipaque)
Comparison: Abdomen pelvis CT [DATE] chest CT [DATE].

CLINICAL DATA: Colon cancer.

EXAM:
CT CHEST, ABDOMEN, AND PELVIS WITH CONTRAST
TECHNIQUE: Multidetector CT imaging of the chest, abdomen and pelvis was
performed following the standard protocol during bolus
administration of intravenous contrast.
CONTRAST:  100mL OMNIPAQUE IOHEXOL 300 MG/ML  SOLN

[Series 2: cap with · axial · 0.87mm/px · z∈[-615,-135]mm · 9 of 121 slices shown, 12 images]
[im 13/121  mediastinal]
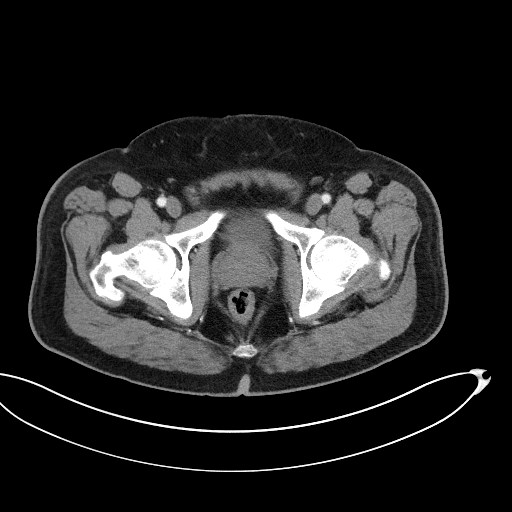
[im 13/121  lung]
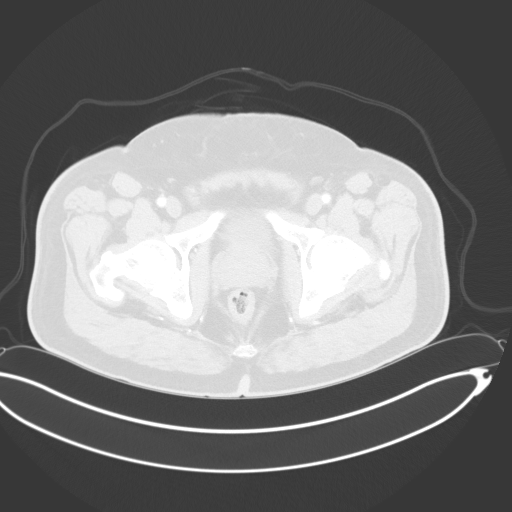
[im 25/121  lung]
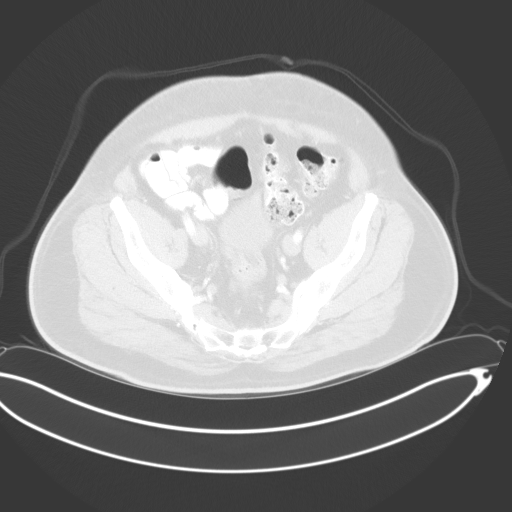
[im 37/121  lung]
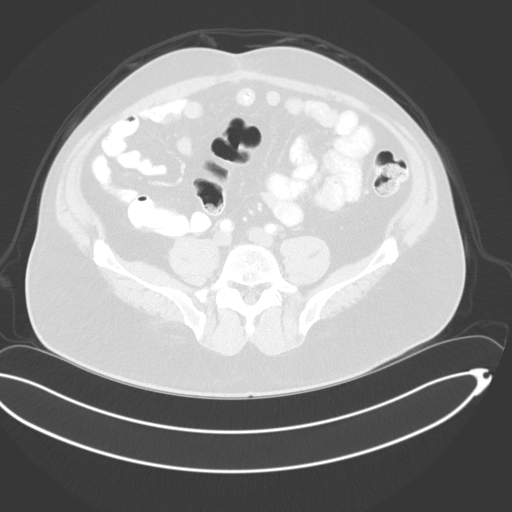
[im 49/121  lung]
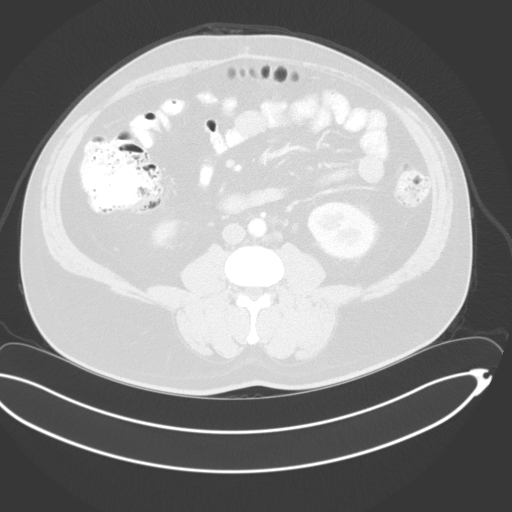
[im 61/121  mediastinal]
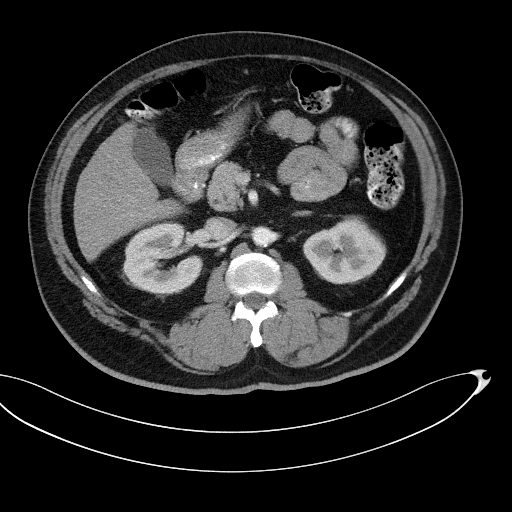
[im 61/121  lung]
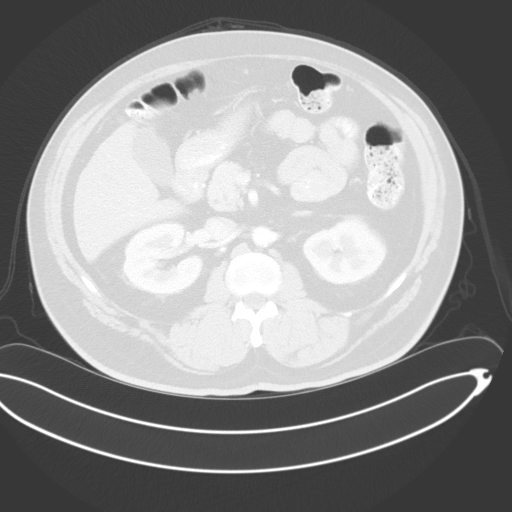
[im 73/121  lung]
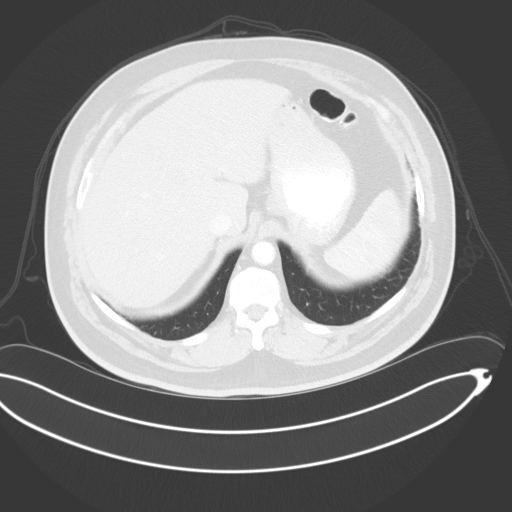
[im 85/121  lung]
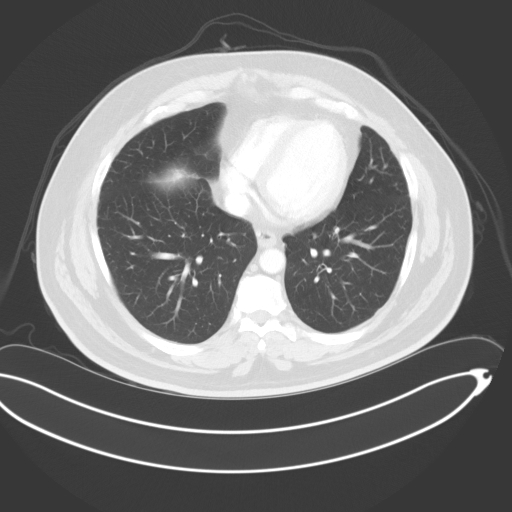
[im 97/121  lung]
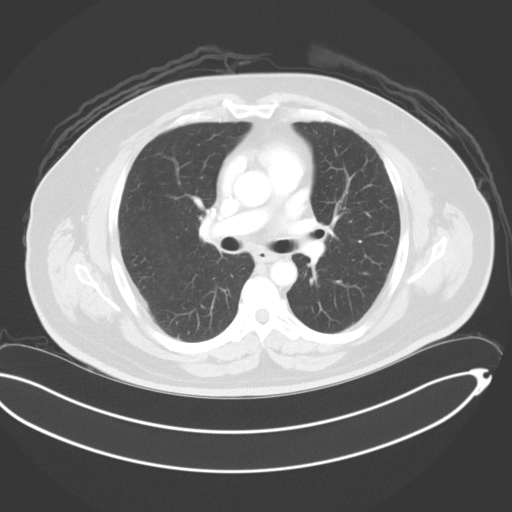
[im 109/121  mediastinal]
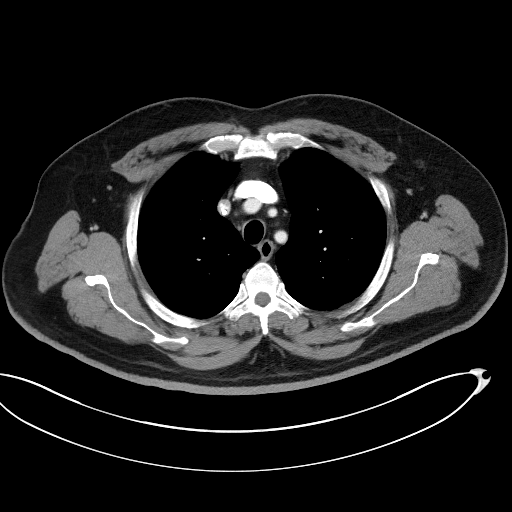
[im 109/121  lung]
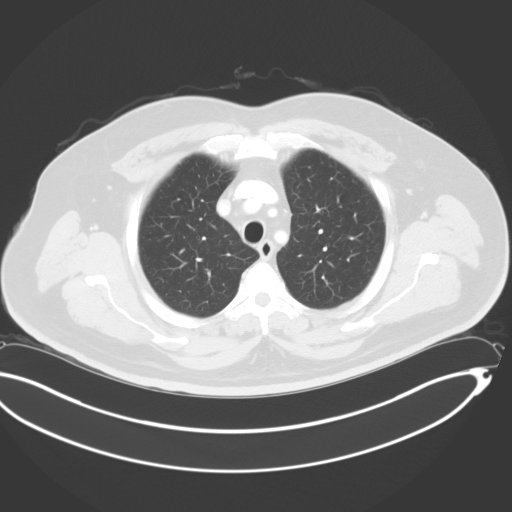

[Series 4: coronals · coronal · 0.82mm/px · 3 of 157 slices shown]
[im 32/157  lung]
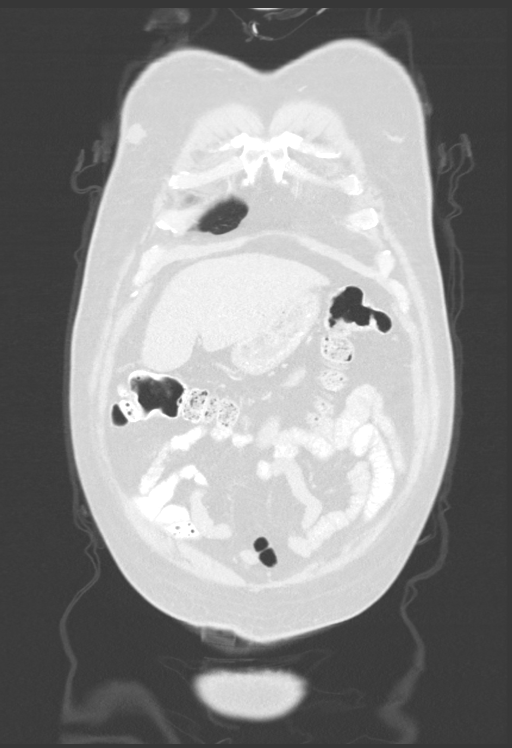
[im 63/157  lung]
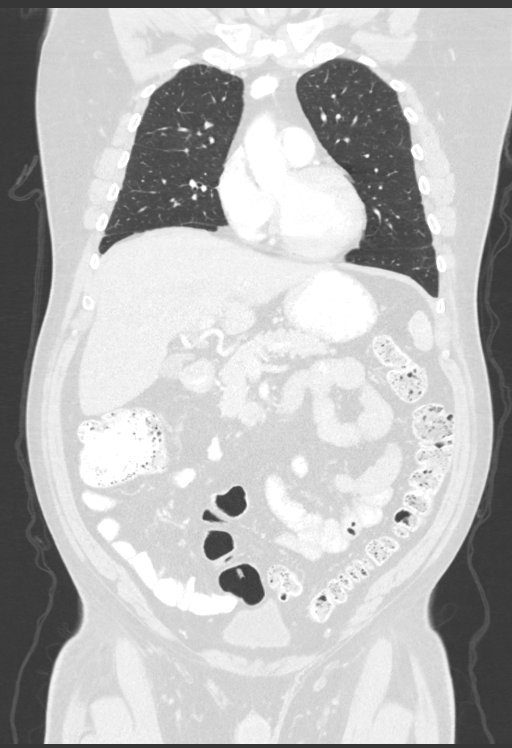
[im 94/157  lung]
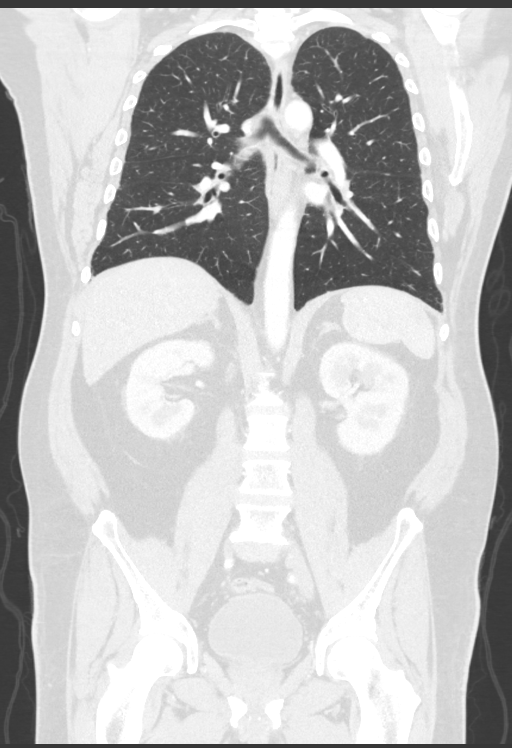

[12 of 36 positions shown; findings below may reference images not displayed]

FINDINGS: CT CHEST FINDINGS

Cardiovascular: The heart size is normal. No substantial pericardial
effusion. Coronary artery calcification is evident. Atherosclerotic
calcification is noted in the wall of the thoracic aorta. Right
Port-A-Cath tip is positioned at the SVC/RA junction.

Mediastinum/Nodes: No mediastinal lymphadenopathy. There is no hilar
lymphadenopathy. The esophagus has normal imaging features. There is
no axillary lymphadenopathy.

Lungs/Pleura: Calcified granuloma noted right middle lobe. Tiny
perifissural nodules in the left lung are stable, compatible with
subpleural lymph nodes. No new suspicious nodule or mass. No focal
airspace consolidation. No pleural effusion.

Musculoskeletal: No worrisome lytic or sclerotic osseous
abnormality.

CT ABDOMEN PELVIS FINDINGS

Hepatobiliary: No suspicious focal abnormality within the liver
parenchyma. There is no evidence for gallstones, gallbladder wall
thickening, or pericholecystic fluid. No intrahepatic or
extrahepatic biliary dilation.

Pancreas: No focal mass lesion. No dilatation of the main duct. No
intraparenchymal cyst. No peripancreatic edema.

Spleen: No splenomegaly. No focal mass lesion.

Adrenals/Urinary Tract: No adrenal nodule or mass. Small exophytic
cyst pole left kidney is similar to prior. No evidence for
hydroureter. The urinary bladder appears normal for the degree of
distention.

Stomach/Bowel: Stomach is unremarkable. No gastric wall thickening.
No evidence of outlet obstruction. Duodenum is normally positioned
as is the ligament of Treitz. No small bowel wall thickening. No
small bowel dilatation. Status post right hemicolectomy No gross
colonic mass. No colonic wall thickening.

Vascular/Lymphatic: There is abdominal aortic atherosclerosis
without aneurysm. There is no gastrohepatic or hepatoduodenal
ligament lymphadenopathy. No retroperitoneal or mesenteric
lymphadenopathy. No pelvic sidewall lymphadenopathy.

Reproductive: The prostate gland and seminal vesicles are
unremarkable.

Other: No intraperitoneal free fluid.

Musculoskeletal: No worrisome lytic or sclerotic osseous
abnormality.
IMPRESSION: 1. Stable exam. No new or progressive findings to suggest recurrent
or metastatic disease.
2. Status post right hemicolectomy.
3. Aortic Atherosclerosis ([UA]-[UA]).

## 2020-03-01 MED ORDER — SODIUM CHLORIDE (PF) 0.9 % IJ SOLN
INTRAMUSCULAR | Status: AC
  Filled 2020-03-01: qty 50

## 2020-03-01 MED ORDER — IOHEXOL 300 MG/ML  SOLN
100.0000 mL | Freq: Once | INTRAMUSCULAR | Status: AC | PRN
  Administered 2020-03-01: 09:00:00 100 mL via INTRAVENOUS

## 2020-03-01 NOTE — Progress Notes (Signed)
[place]   [phone] [phone]) [phone]   Clinic Follow up Note   Patient Care Team: [name], [name], DO as PCP - General [name], MD as Consulting Physician (Colon and Rectal Surgery) [name], MD as Consulting Physician (Hematology)  Date of Service:  03/12/2020  CHIEF COMPLAINT: F/u on colon cancer  SUMMARY OF ONCOLOGIC HISTORY: Oncology History Overview Note  Cancer Staging Cancer of right colon [facility]) Staging form: Colon and Rectum, AJCC 8th Edition - Pathologic stage from 10/21/2018: Stage IVC (pT4b, pN1a, pM1c) - Signed by [name], MD on 91/08/1659  Follicular lymphoma ([place]), History of Staging form: Lymphoid Neoplasms, AJCC 6th Edition - Clinical: Stage II - Signed by Curt Bears, MD on 6/00/4599     Follicular lymphoma [facility]), History of  11/2009 Initial Diagnosis   Follicular lymphoma ([place]), History of    Chemotherapy   1) Status post 6 cycles of systemic chemotherapy with CHOP/Rituxan last dose given 05/01/2009.  2) Maintenance Rituxan at 375 mg per meter square given every 2 months status post 12 cycles    Cancer of right colon ([place])  10/21/2018 Surgery   Exploratory laparotomy right hemicolectomy by Dr. [name] and Dr. [name]  10/21/18   10/21/2018 Pathology Results   Diagnosis 10/21/18 Colon, segmental resection for tumor, right ascending and appendix - ADENOCARCINOMA, MODERATE TO POORLY DIFFERENTIATED (4 CM) - METASTATIC CARCINOMA INVOLVING ONE OF EIGHTEEN LYMPH NODES (1/18) - CARCINOMA EXTENDS INTO THE APPENDIX - TWO TUMOR DEPOSITS PRESENT - SEE ONCOLOGY TABLE AND COMMENT BELOW   10/21/2018 Cancer Staging   Staging form: Colon and Rectum, AJCC 8th Edition - Pathologic stage from 10/21/2018: Stage IVC (pT4b, pN1a, pM1c) - Signed by [name], MD on 11/13/2018   11/04/2018 Imaging   CT AP W Contrast 11/04/18  IMPRESSION: 1. Interval appendectomy. There is residual soft tissue thickening and  stranding in the right lower quadrant adjacent to the cecum which may represent ongoing inflammation versus postsurgical changes. A small soft tissue density adjacent to the surgical sutures may reflect small hematoma or operative collection. No large focal fluid collection to suggest drainable abscess allowing for absence of contrast 2. Fluid-filled colon without wall thickening, could reflect diarrheal process   11/08/2018 Imaging   CT Chest W Contrast 11/08/18  IMPRESSION: 1. No acute findings are noted in the thorax to account for the patient's symptoms. 2. Aortic atherosclerosis, in addition to left main and 3 vessel coronary artery disease. Please note that although the presence of coronary artery calcium documents the presence of coronary artery disease, the severity of this disease and any potential stenosis cannot be assessed on this non-gated CT examination. Assessment for potential risk factor modification, dietary therapy or pharmacologic therapy may be warranted, if clinically indicated. 3. Additional incidental findings, as above. Aortic Atherosclerosis (ICD10-I70.0).   11/12/2018 Initial Diagnosis   Cancer of right colon ([place])   11/25/2018 - 05/05/2019 Chemotherapy   FOLFOX q2weeks starting 11/25/18. Due to moderate thrombocytopenia, I will stop 5-FU bolus, and reduce pump infusion to 2224m/m2 starting with cycle 7.  Plan for last treatment on 05/05/19.    02/10/2019 Imaging   CT AP W Contrast  IMPRESSION: 1. No evidence metastatic disease. 2. Hepatic steatosis. 3. Mildly enlarged prostate. 4. Aortic atherosclerosis (ICD10-170.0). coronary artery calcification.   09/02/2019 Imaging   CT AP W Contrast IMPRESSION: 1. No signs to suggest metastatic disease in the abdomen or pelvis. 2. Aortic atherosclerosis these, as well as right coronary artery disease.  03/01/2020 Imaging   CT CAP W Contrast  IMPRESSION: 1. Stable exam. No new or progressive findings to  suggest recurrent or metastatic disease. 2. Status post right hemicolectomy. 3. Aortic Atherosclerosis (ICD10-I70.0).      CURRENT THERAPY:  Surveillance  INTERVAL HISTORY:  Blake Vaughn is here for a follow up of colon cancer. He presents to the clinic with his Spanish interpretor. He noes he has recovered well but still has neuropathy with numbness. He can still function. He notes he does not monitor his BG lately. He wondered could he use constant glucose meter. He plans to set appointment with his PCP today.     REVIEW OF SYSTEMS:   Constitutional: Denies fevers, chills or abnormal weight loss Eyes: Denies blurriness of vision Ears, nose, mouth, throat, and face: Denies mucositis or sore throat Respiratory: Denies cough, dyspnea or wheezes Cardiovascular: Denies palpitation, chest discomfort or lower extremity swelling Gastrointestinal:  Denies nausea, heartburn or change in bowel habits Skin: Denies abnormal skin rashes Lymphatics: Denies new lymphadenopathy or easy bruising Neurological: (+) Residual numbness of hands and feet Behavioral/Psych: Mood is stable, no new changes  All other systems were reviewed with the patient and are negative.  MEDICAL HISTORY:  Past Medical History:  Diagnosis Date  . Appendicitis 10/21/2018  . Diabetes mellitus   . Follicular lymphoma ([place]), 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   Qualifier: Diagnosis of  By: [name], [name]   . GI bleed 11/05/2018  . Hypercholesterolemia   . Hypertension 11/27/2011  . Lymphoma [facility])    gets annual chemo last tx Feb 2013  . TOBACCO USE, QUIT 08/18/2009   Qualifier: Diagnosis of  By: Drue Flirt  MD, Merrily Brittle      SURGICAL HISTORY: Past Surgical History:  Procedure Laterality Date  . ESOPHAGOGASTRODUODENOSCOPY (EGD) WITH PROPOFOL N/A 11/05/2018   Procedure: ESOPHAGOGASTRODUODENOSCOPY (EGD) WITH PROPOFOL;  Surgeon: [name], MD;  Location: [facility];  Service: Endoscopy;  Laterality: N/A;  . IR  IMAGING GUIDED PORT INSERTION  11/24/2018  . LAPAROSCOPIC APPENDECTOMY N/A 10/21/2018   Procedure: Exploratory laparotomy right hemicolectomy;  Surgeon: [name], MD;  Location: [facility];  Service: General;  Laterality: N/A;    I have reviewed the social history and family history with the patient and they are unchanged from previous note.  ALLERGIES:  is allergic to iohexol.  MEDICATIONS:  Current Outpatient Medications  Medication Sig Dispense Refill  . Accu-Chek Softclix Lancets lancets Use as instructed 100 each 12  . atorvastatin (LIPITOR) 40 MG tablet Take 1 tablet (40 mg total) by mouth daily. 90 tablet 3  . Blood Glucose Monitoring Suppl (ACCU-CHEK AVIVA PLUS) w/Device KIT Inject 1 kit into the skin 2 (two) times daily. 1 kit 0  . diphenhydrAMINE (BENADRYL) 50 MG tablet Take 1 tablet (50 mg total) by mouth as directed. Take 1 tablet 1 hour prior to Ct Scan 1 tablet 0  . glipiZIDE (GLUCOTROL XL) 10 MG 24 hr tablet Take 1 tablet (10 mg total) by mouth daily with breakfast. 90 tablet 0  . glucose blood (ACCU-CHEK AVIVA PLUS) test strip Use as instructed 100 each 12  . hydrOXYzine (ATARAX/VISTARIL) 25 MG tablet Take 1 tablet (25 mg total) by mouth every 6 (six) hours. 12 tablet 0  . lidocaine-prilocaine (EMLA) cream Apply to affected area once 30 g 3  . lidocaine-prilocaine (EMLA) cream Apply 1 application topically as needed. 30 g 1  . metFORMIN (GLUCOPHAGE) 1000 MG tablet Take 1 tablet (1,000 mg total) by mouth  2 (two) times daily with a meal. 180 tablet 3  . predniSONE (DELTASONE) 50 MG tablet Take 1 tablet 13 hours before ct scan, 1 tablet 7 hours before ct scan, and 1 tablet 1 hour before ct scan 3 tablet 0  . sertraline (ZOLOFT) 50 MG tablet Take 1 tablet (50 mg total) by mouth daily. 30 tablet 0  . sitaGLIPtin (JANUVIA) 50 MG tablet Take 1 tablet (50 mg total) by mouth daily. 90 tablet 3   No current facility-administered medications for this visit.    PHYSICAL  EXAMINATION: ECOG PERFORMANCE STATUS: 0 - Asymptomatic  Vitals:   03/12/20 1120  BP: (!) 152/77  Pulse: 77  Resp: 20  Temp: 98.2 F (36.8 C)  SpO2: 98%   Filed Weights   03/12/20 1120  Weight: 202 lb 8 oz (91.9 kg)    Due to COVID19 we will limit examination to appearance. Patient had no complaints.  GENERAL:alert, no distress and comfortable SKIN: skin color normal, no rashes or significant lesions EYES: normal, Conjunctiva are pink and non-injected, sclera clear  NEURO: alert & oriented x 3 with fluent speech   LABORATORY DATA:  I have reviewed the data as listed CBC Latest Ref Rng & Units 03/12/2020 03/01/2020 12/24/2019  WBC 4.0 - 10.5 K/uL 10.5 8.8 9.7  Hemoglobin 13.0 - 17.0 g/dL 13.4 14.5 13.9  Hematocrit 39.0 - 52.0 % 39.0 42.0 40.2  Platelets 150 - 400 K/uL 212 204 201     CMP Latest Ref Rng & Units 03/12/2020 03/01/2020 12/24/2019  Glucose 70 - 99 mg/dL 172(H) 374(H) 170(H)  BUN 6 - 20 mg/dL _0 Creatinine 0.61 - 1.24 mg/dL 0.79 1.01 0.78  Sodium 135 - 145 mmol/L 138 138 137  Potassium 3.5 - 5.1 mmol/L 4.5 5.1 4.1  Chloride 98 - 111 mmol/L 103 103 103  CO2 22 - 32 mmol/L _1 Calcium 8.9 - 10.3 mg/dL 9.4 9.2 8.8(L)  Total Protein 6.5 - 8.1 g/dL 6.6 6.7 -  Total Bilirubin 0.3 - 1.2 mg/dL 0.4 0.7 -  Alkaline Phos 38 - 126 U/L 97 93 -  AST 15 - 41 U/L 15 18 -  ALT 0 - 44 U/L 28 26 -      RADIOGRAPHIC STUDIES: I have personally reviewed the radiological images as listed and agreed with the findings in the report. No results found.   ASSESSMENT & PLAN:  Blake Vaughn is a 59 y.o. male with    1.RightCecal adenocarcinoma,pT4bN1aM1c,Stage IVC,with limited peritoneal metastasis,resected,Grade II-III, MMR normal -Diagnosed in 10/2018. Treated with right hemicolectomy.He was found to have 2 smallperitoneal metastasis during the surgery, which were removed. -He completed 12 cycles of adjuvant FOLFOX. -Given high risk of recurrence, I  recommend he keep his PAC in placefor 1-2 years. Will continue port flush every 6-8 weeks -I personally reviewed and discussed his CT CAP from 03/01/20 which shows NED. I reviewed incidental finding of aortic arthrosclerosis, he will f/u with PCP.  -He is clinically doing well. Labs reviewed from last week, CBC and CMP WNL except BG 374. CEA and Iron panel normal.  -I discussed continuing 5 year close surveillance plan. Repeat CT scan in 6 months given his very high risk of recurrence.  -F/u in 3 months with tumor marker    2. H/o Follicular Lymphoma -Was treated with CHOP/Rituxan in 2010.Then he waspreviouslytreated withMaintenance Rituxancompleted in 2012.Last f/u was in 2017.  3. DM and Hypercholesterolemia, uncontrolled hyperglycemia -On metformin,Glimepiride,Lipitor and metoprolol.   -  His CT AP from 09/02/19 also shows aortic atherosclerosis, which can increase his risk for MI.  -His BG from 03/01/20 shows 374. This could be from Prednisone use prior to scan. His last A1c 7.4 in 12/2019. He has not been using glucometer lately to monitor.  -I strongly encouraged him to follow up with his PCP. He plans to make appointment today.  -I also recommend he work on losing weight  4. Financial Support -Medicaid pending -His wife is in [place]. Has 2 children andabrother that help his at home  5. Insomnia -He is currently takingBenadrylas needed which helps  6. Neuropathy, G1-2 -Secondary toprior chemo Oxaliplatin  -He has Gabapentin.  -He still has residual neuropathy with numbness.  -I previously discussed if DM is not well controlled this can prolong or worsen his neuropathy.  Plan -CT scan reviewed, NED  -flush in 6 weeks -Lab, flush and f/u in 3 months     No problem-specific Assessment & Plan notes found for this encounter.   No orders of the defined types were placed in this encounter.  All questions were answered. The patient knows to call the clinic  with any problems, questions or concerns. No barriers to learning was detected. The total time spent in the appointment was 30 minutes.     [name], MD 03/12/2020   I, Joslyn Devon, am acting as scribe for [name], MD.   I have reviewed the above documentation for accuracy and completeness, and I agree with the above.

## 2020-03-07 ENCOUNTER — Ambulatory Visit: Payer: Medicaid Other | Admitting: Hematology

## 2020-03-07 ENCOUNTER — Other Ambulatory Visit: Payer: Medicaid Other

## 2020-03-07 ENCOUNTER — Other Ambulatory Visit: Payer: Self-pay | Admitting: Family Medicine

## 2020-03-07 DIAGNOSIS — E1165 Type 2 diabetes mellitus with hyperglycemia: Secondary | ICD-10-CM

## 2020-03-07 MED ORDER — GLIPIZIDE ER 10 MG PO TB24: 10.0000 mg | ORAL_TABLET | Freq: Every day | ORAL | 0 refills | Status: DC

## 2020-03-07 MED ORDER — METFORMIN HCL 1000 MG PO TABS: 1000.0000 mg | ORAL_TABLET | Freq: Two times a day (BID) | ORAL | 3 refills | Status: DC

## 2020-03-07 NOTE — Telephone Encounter (Signed)
Patient calling to request refill of: glipiZIDE, and metFORMIN  Name of Medication(s):  glipiZIDE, metFORMIN Last date of OV:  01-03-2020 Pharmacy: [facility].  Will route refill request to Clinic RN.  Discussed with patient policy to call pharmacy for future refills.  Also, discussed refills may take up to 48 hours to approve or deny.  Richrd Humbles

## 2020-03-08 ENCOUNTER — Ambulatory Visit: Payer: Medicaid Other | Admitting: Family Medicine

## 2020-03-12 ENCOUNTER — Inpatient Hospital Stay: Payer: Medicaid Other

## 2020-03-12 ENCOUNTER — Inpatient Hospital Stay: Payer: Medicaid Other | Attending: Hematology

## 2020-03-12 ENCOUNTER — Other Ambulatory Visit: Payer: Self-pay

## 2020-03-12 ENCOUNTER — Encounter: Payer: Self-pay | Admitting: Hematology

## 2020-03-12 ENCOUNTER — Telehealth: Payer: Self-pay | Admitting: Hematology

## 2020-03-12 ENCOUNTER — Inpatient Hospital Stay (HOSPITAL_BASED_OUTPATIENT_CLINIC_OR_DEPARTMENT_OTHER): Payer: Medicaid Other | Admitting: Hematology

## 2020-03-12 VITALS — BP 152/77 | HR 77 | Temp 98.2°F | Resp 20 | Ht 65.0 in | Wt 202.5 lb

## 2020-03-12 DIAGNOSIS — C182 Malignant neoplasm of ascending colon: Secondary | ICD-10-CM | POA: Diagnosis not present

## 2020-03-12 DIAGNOSIS — Z79899 Other long term (current) drug therapy: Secondary | ICD-10-CM | POA: Diagnosis not present

## 2020-03-12 DIAGNOSIS — C786 Secondary malignant neoplasm of retroperitoneum and peritoneum: Secondary | ICD-10-CM | POA: Diagnosis not present

## 2020-03-12 DIAGNOSIS — I10 Essential (primary) hypertension: Secondary | ICD-10-CM | POA: Diagnosis not present

## 2020-03-12 DIAGNOSIS — Z9049 Acquired absence of other specified parts of digestive tract: Secondary | ICD-10-CM | POA: Diagnosis not present

## 2020-03-12 DIAGNOSIS — G620 Drug-induced polyneuropathy: Secondary | ICD-10-CM | POA: Insufficient documentation

## 2020-03-12 DIAGNOSIS — E1165 Type 2 diabetes mellitus with hyperglycemia: Secondary | ICD-10-CM | POA: Diagnosis not present

## 2020-03-12 DIAGNOSIS — Z7984 Long term (current) use of oral hypoglycemic drugs: Secondary | ICD-10-CM | POA: Insufficient documentation

## 2020-03-12 DIAGNOSIS — Z8572 Personal history of non-Hodgkin lymphomas: Secondary | ICD-10-CM | POA: Insufficient documentation

## 2020-03-12 DIAGNOSIS — Z7952 Long term (current) use of systemic steroids: Secondary | ICD-10-CM | POA: Insufficient documentation

## 2020-03-12 DIAGNOSIS — I2510 Atherosclerotic heart disease of native coronary artery without angina pectoris: Secondary | ICD-10-CM | POA: Diagnosis not present

## 2020-03-12 DIAGNOSIS — N400 Enlarged prostate without lower urinary tract symptoms: Secondary | ICD-10-CM | POA: Diagnosis not present

## 2020-03-12 DIAGNOSIS — Z87891 Personal history of nicotine dependence: Secondary | ICD-10-CM | POA: Insufficient documentation

## 2020-03-12 DIAGNOSIS — Z9221 Personal history of antineoplastic chemotherapy: Secondary | ICD-10-CM | POA: Diagnosis not present

## 2020-03-12 DIAGNOSIS — Z95828 Presence of other vascular implants and grafts: Secondary | ICD-10-CM

## 2020-03-12 DIAGNOSIS — E7800 Pure hypercholesterolemia, unspecified: Secondary | ICD-10-CM | POA: Diagnosis not present

## 2020-03-12 DIAGNOSIS — G4700 Insomnia, unspecified: Secondary | ICD-10-CM | POA: Insufficient documentation

## 2020-03-12 LAB — CMP (CANCER CENTER ONLY)
ALT: 28 U/L (ref 0–44)
AST: 15 U/L (ref 15–41)
Albumin: 3.6 g/dL (ref 3.5–5.0)
Alkaline Phosphatase: 97 U/L (ref 38–126)
Anion gap: 11 (ref 5–15)
BUN: 18 mg/dL (ref 6–20)
CO2: 24 mmol/L (ref 22–32)
Calcium: 9.4 mg/dL (ref 8.9–10.3)
Chloride: 103 mmol/L (ref 98–111)
Creatinine: 0.79 mg/dL (ref 0.61–1.24)
GFR, Est AFR Am: >60 mL/min (ref >60)
GFR, Estimated: >60 mL/min (ref >60)
Glucose, Bld: 172 mg/dL — ABNORMAL HIGH (ref 70–99)
Potassium: 4.5 mmol/L (ref 3.5–5.1)
Sodium: 138 mmol/L (ref 135–145)
Total Bilirubin: 0.4 mg/dL (ref 0.3–1.2)
Total Protein: 6.6 g/dL (ref 6.5–8.1)

## 2020-03-12 LAB — CBC WITH DIFFERENTIAL (CANCER CENTER ONLY)
Abs Immature Granulocytes: 0.04 10*3/uL (ref 0.00–0.07)
Basophils Absolute: 0 10*3/uL (ref 0.0–0.1)
Basophils Relative: 0 %
Eosinophils Absolute: 0.8 10*3/uL — ABNORMAL HIGH (ref 0.0–0.5)
Eosinophils Relative: 7 %
HCT: 39 % (ref 39.0–52.0)
Hemoglobin: 13.4 g/dL (ref 13.0–17.0)
Immature Granulocytes: 0 %
Lymphocytes Relative: 24 %
Lymphs Abs: 2.6 10*3/uL (ref 0.7–4.0)
MCH: 30.5 pg (ref 26.0–34.0)
MCHC: 34.4 g/dL (ref 30.0–36.0)
MCV: 88.8 fL (ref 80.0–100.0)
Monocytes Absolute: 0.7 10*3/uL (ref 0.1–1.0)
Monocytes Relative: 7 %
Neutro Abs: 6.4 10*3/uL (ref 1.7–7.7)
Neutrophils Relative %: 62 %
Platelet Count: 212 10*3/uL (ref 150–400)
RBC: 4.39 MIL/uL (ref 4.22–5.81)
RDW: 12.4 % (ref 11.5–15.5)
WBC Count: 10.5 10*3/uL (ref 4.0–10.5)
nRBC: 0 % (ref 0.0–0.2)

## 2020-03-12 MED ORDER — SODIUM CHLORIDE 0.9% FLUSH
10.0000 mL | INTRAVENOUS | Status: DC | PRN
  Administered 2020-03-12: 10 mL
  Filled 2020-03-12: qty 10

## 2020-03-12 MED ORDER — HEPARIN SOD (PORK) LOCK FLUSH 100 UNIT/ML IV SOLN
500.0000 [IU] | Freq: Once | INTRAVENOUS | Status: AC | PRN
  Administered 2020-03-12: 500 [IU]
  Filled 2020-03-12: qty 5

## 2020-03-12 NOTE — Telephone Encounter (Signed)
Scheduled per los. Gave avs and calendar  

## 2020-04-23 ENCOUNTER — Other Ambulatory Visit: Payer: Self-pay

## 2020-04-23 ENCOUNTER — Inpatient Hospital Stay: Payer: Medicaid Other | Attending: Hematology

## 2020-04-23 DIAGNOSIS — Z452 Encounter for adjustment and management of vascular access device: Secondary | ICD-10-CM | POA: Diagnosis present

## 2020-04-23 DIAGNOSIS — C182 Malignant neoplasm of ascending colon: Secondary | ICD-10-CM | POA: Insufficient documentation

## 2020-04-23 DIAGNOSIS — Z95828 Presence of other vascular implants and grafts: Secondary | ICD-10-CM

## 2020-04-23 MED ORDER — HEPARIN SOD (PORK) LOCK FLUSH 100 UNIT/ML IV SOLN
500.0000 [IU] | Freq: Once | INTRAVENOUS | Status: AC | PRN
  Administered 2020-04-23: 500 [IU]
  Filled 2020-04-23: qty 5

## 2020-04-23 MED ORDER — SODIUM CHLORIDE 0.9% FLUSH
10.0000 mL | INTRAVENOUS | Status: DC | PRN
  Administered 2020-04-23: 10 mL
  Filled 2020-04-23: qty 10

## 2020-04-23 NOTE — Patient Instructions (Signed)

## 2020-05-11 ENCOUNTER — Other Ambulatory Visit: Payer: Self-pay | Admitting: Family Medicine

## 2020-05-16 ENCOUNTER — Other Ambulatory Visit: Payer: Self-pay

## 2020-05-16 ENCOUNTER — Ambulatory Visit (INDEPENDENT_AMBULATORY_CARE_PROVIDER_SITE_OTHER): Payer: Medicaid Other | Admitting: Family Medicine

## 2020-05-16 VITALS — BP 136/72 | HR 106 | Wt 197.2 lb

## 2020-05-16 DIAGNOSIS — E1165 Type 2 diabetes mellitus with hyperglycemia: Secondary | ICD-10-CM | POA: Diagnosis not present

## 2020-05-16 DIAGNOSIS — M542 Cervicalgia: Secondary | ICD-10-CM | POA: Diagnosis not present

## 2020-05-16 DIAGNOSIS — R070 Pain in throat: Secondary | ICD-10-CM

## 2020-05-16 LAB — POCT GLYCOSYLATED HEMOGLOBIN (HGB A1C): HbA1c, POC (controlled diabetic range): 7.9 % — AB (ref 0.0–7.0)

## 2020-05-16 MED ORDER — GLIPIZIDE ER 10 MG PO TB24: ORAL_TABLET | ORAL | 0 refills | Status: DC

## 2020-05-16 MED ORDER — SITAGLIPTIN PHOSPHATE 50 MG PO TABS: 50.0000 mg | ORAL_TABLET | Freq: Every day | ORAL | 3 refills | Status: DC

## 2020-05-16 MED ORDER — METFORMIN HCL 1000 MG PO TABS: 1000.0000 mg | ORAL_TABLET | Freq: Two times a day (BID) | ORAL | 3 refills | Status: DC

## 2020-05-16 NOTE — Assessment & Plan Note (Signed)
Unclear etiology. Thyroid non tender and non enlarged on exam. Will get TSH to r/o thyroid pathology. Remote h/o tobacco use so less likely cancer but with h/o colon cancer should be considered. If TSH wnl and patient continues to have pain at f/u could consider ENT referral. Advised to have close f/u with PCP to ensure no worsening of pain. Can consider angioedema given SOB however not on ACEI or other medications that could cause this. Discussed with Dr. Andria Frames

## 2020-05-16 NOTE — Progress Notes (Signed)
° ° °  SUBJECTIVE:   CHIEF COMPLAINT / HPI:   Issue with throat Patient states he has sore throat. States his breathing feels "tired". Reports that he has had pain x1 year. Was seen before for this but reports he received no medications. Thinks it might be his thryoid. He looked at pictures and saw thyroid was where pain was. Pain comes and goes. Pain is worse with kneeling forward. When pain occurs it "shuts down his breathing like asthma". No family history of thyroid concerns. Remote tobacco use >18 years ago.   Diabetes Fasting checks: does not check  Post prandial does not check   Compliance: has been out of medications due to lack of refills.  Diet: chicken, lettuce, tomatoes, fish. Low in sugars and carbohydrates.   Exercise: walks daily  Eye exam: due  Foot exam: UTD  A1C: 7.9 Symptoms: no symptoms of hypoglycemia. no symptoms of  polyuria, polydipsia. no numbness in extremities, and no foot ulcers/trauma Meds: metformin 1000mg  bid, glipizide 5mg  daily, januvia/sitagliptin 50mg  daily Pneumonia vaccine: due  Urine micro albumin:creatine ratio: due  Monitoring Labs and Parameters Last A1C:  Lab Results  Component Value Date   HGBA1C 7.9 (A) 05/16/2020   Last Lipid:     Component Value Date/Time   CHOL 233 (H) 12/20/2019 1638   HDL 46 12/20/2019 1638   LDLDIRECT 138 (H) 11/21/2011 0957   Last Bmet  Potassium  Date Value Ref Range Status  03/12/2020 4.5 3.5 - 5.1 mmol/L Final  09/09/2016 5.1 3.5 - 5.1 mEq/L Final   Sodium  Date Value Ref Range Status  03/12/2020 138 135 - 145 mmol/L Final  07/16/2017 137 134 - 144 mmol/L Final  09/09/2016 133 (L) 136 - 145 mEq/L Final   Creatinine  Date Value Ref Range Status  03/12/2020 0.79 0.61 - 1.24 mg/dL Final  09/09/2016 1.0 0.7 - 1.3 mg/dL Final       PERTINENT  PMH / PSH: colon cancer, obesity, T2DM  OBJECTIVE:   BP 136/72    Pulse (!) 106    Wt 197 lb 3.2 oz (89.4 kg)    SpO2 96%    BMI 32.82 kg/m   Gen: awake  and alert, NAD HEENT: moist mucous membranes, neck non tender, full neck ROM. Thyroid non enlarged, non tender  Cardio: RRR, no MRG Resp: CTAB, no wheezes, rales or rhonchi GI: soft, non tender, non distended, bowel sounds present Ext: no edema   ASSESSMENT/PLAN:   Diabetes type 2, uncontrolled (HCC) Worsening A1C. Has been out of medications which is likely contributing. Refilled all medications. Advised to f/u with PCP in 2 weeks to ensure compliance and better control. Would recommend pneumonia vaccine as well as ophthalmology exam and urine microalbumin at that time.   Neck pain Unclear etiology. Thyroid non tender and non enlarged on exam. Will get TSH to r/o thyroid pathology. Remote h/o tobacco use so less likely cancer but with h/o colon cancer should be considered. If TSH wnl and patient continues to have pain at f/u could consider ENT referral. Advised to have close f/u with PCP to ensure no worsening of pain. Can consider angioedema given SOB however not on ACEI or other medications that could cause this. Discussed with Dr. Moreen Fowler, [place]

## 2020-05-16 NOTE — Assessment & Plan Note (Signed)
Worsening A1C. Has been out of medications which is likely contributing. Refilled all medications. Advised to f/u with PCP in 2 weeks to ensure compliance and better control. Would recommend pneumonia vaccine as well as ophthalmology exam and urine microalbumin at that time.

## 2020-05-16 NOTE — Patient Instructions (Signed)
1. Haremos anlisis de laboratorio para controlar su tiroides. Llamaremos o enviaremos un mensaje de [employer]. 2. Seguimiento en 1 mes para asegurar una mejora en el dolor de garganta. 3. Para su diabetes, le he enviado sus medicamentos. Haga un seguimiento en 2 semanas para ver si sus azcares estn ms controlados con medicamentos.   1. We will get labs to check your thyroid. We will either call or send a mychart message with results 2. Follow up in 1 month to ensure improvement in throat pain  3. For your diabetes, I have sent in your medications. Please follow up in 2 weeks to see if your sugars are more controlled with medications

## 2020-05-17 LAB — TSH: TSH: 1 u[IU]/mL (ref 0.450–4.500)

## 2020-05-21 ENCOUNTER — Ambulatory Visit (INDEPENDENT_AMBULATORY_CARE_PROVIDER_SITE_OTHER): Payer: Medicaid Other | Admitting: Family Medicine

## 2020-05-21 ENCOUNTER — Encounter: Payer: Self-pay | Admitting: Family Medicine

## 2020-05-21 ENCOUNTER — Other Ambulatory Visit: Payer: Self-pay

## 2020-05-21 VITALS — BP 130/78 | HR 83 | Wt 200.0 lb

## 2020-05-21 DIAGNOSIS — M25511 Pain in right shoulder: Secondary | ICD-10-CM

## 2020-05-21 DIAGNOSIS — E1165 Type 2 diabetes mellitus with hyperglycemia: Secondary | ICD-10-CM | POA: Diagnosis not present

## 2020-05-21 DIAGNOSIS — M25512 Pain in left shoulder: Secondary | ICD-10-CM | POA: Diagnosis not present

## 2020-05-21 DIAGNOSIS — G8929 Other chronic pain: Secondary | ICD-10-CM | POA: Diagnosis not present

## 2020-05-21 DIAGNOSIS — G629 Polyneuropathy, unspecified: Secondary | ICD-10-CM | POA: Diagnosis not present

## 2020-05-21 MED ORDER — GABAPENTIN 300 MG PO CAPS
300.0000 mg | ORAL_CAPSULE | Freq: Three times a day (TID) | ORAL | 3 refills | Status: DC
Start: 2020-05-21 — End: 2021-03-28

## 2020-05-21 MED FILL — GABAPENTIN 300 MG CAPSULE: 300 | 30 days supply | Qty: 90 | Fill #0

## 2020-05-21 NOTE — Patient Instructions (Addendum)
Thank you for coming to see me today. It was a pleasure! Today we talked about:   I have placed a referral to sports medicine for your shoulder pain.  You should hear from them within 2 weeks and if you do not reach out to our clinic to let us know.  I have started you on a medication for the numbness in your hands called gabapentin.  Please take this nightly before bed.  If you notice that you are still having numbness and tingling throughout the day then you may take this up to 3 times per day.  Please let us know if you have continued numbness and tingling in 4 to 6 weeks and we can explore other causes at that time.  Please follow-up with our clinic in 4 to 6 weeks if you have continued numbness and tingling or sooner as needed.  Please be sure to follow-up in 3 months for your regular diabetes check.  If you have any questions or concerns, please do not hesitate to call the office at [phone].  Take Care,   [name] Ayzia Day, DO

## 2020-05-21 NOTE — Progress Notes (Signed)
   SUBJECTIVE:   CHIEF COMPLAINT / HPI: Interpreter used for visit.  Shoulders hurt when he is laying down at night. Injections did not help. He reports he occasionally gets numbness in his hands. R worse than the Left. He is able to complete his daily activities, but it wakes him at night. He has tried some tylenol fort he pain and it helps some but he still has pain in the morning. He reports the injections did not help and he takes tylenol which helps temporarily. He would like to see someone else about his pain  Bilateral hand numbness: He has had this in both hands for a couple of months. Worse at night. He reports using his hands regularly and he works in the yard but does not currently have full time job. Notices this every night. He reports that It occurs over his whole hands and has happened for a few years now. He has previously been on gabapentin but has not noticed any improvement when on that medication, but he does not know how long he took it.   PERTINENT  PMH / PSH: U5KY, h/o follicular lymphoma and colon cancer, HTN  OBJECTIVE:  BP 130/78   Pulse 83   Wt 200 lb (90.7 kg)   SpO2 97%   BMI 33.28 kg/m   General: NAD, pleasant Respiratory: normal work of breathing Shoulder, BL: ROM is full in all planes.  Left wrist/hand: No deformity noted. FROM with 5/5 strength digits and wrist. No TTP NVI distally. Negative tinels, phalens.   Right wrist/hand: No deformity noted. FROM with 5/5 strength digits and wrist. No TTP. NVI distally. Negative tinels, phalens.  ASSESSMENT/PLAN:   Bilateral shoulder pain Given hx of continued pain of BL shoulders and little help with injections, will refer to sports medicine. Patient has not done PT but reports meloxicam only worked for short time. Will defer to sports medicine on MRI. R worse than L. 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   Neuropathy Neuropathy in bilateral hands likely 2/2  chemotherapy hx and poorly controlled diabetes. Sx not c/w carpal tunnel, but may also be contributing to pain. Will trial gabapentin again as patient did not try for long previously and cannot remember if it helped at that time. If pain continues, may need further work up to rule out carpal tunnel as cause.  - follow up 4-6 weeks if sx continue    [name] Nazir Hacker, DO PGY-3, [place]

## 2020-05-23 DIAGNOSIS — Z23 Encounter for immunization: Secondary | ICD-10-CM | POA: Diagnosis not present

## 2020-05-23 LAB — MICROALBUMIN, URINE: Microalbumin, Urine: 1237.7 ug/mL

## 2020-05-25 NOTE — Assessment & Plan Note (Addendum)
Neuropathy in bilateral hands likely 2/2 chemotherapy hx and poorly controlled diabetes. Sx not c/w carpal tunnel, but may also be contributing to pain. Will trial gabapentin again as patient did not try for long previously and cannot remember if it helped at that time. If pain continues, may need further work up to rule out carpal tunnel as cause.  - follow up 4-6 weeks if sx continue

## 2020-05-25 NOTE — Assessment & Plan Note (Signed)
Given hx of continued pain of BL shoulders and little help with injections, will refer to sports medicine. Patient has not done PT but reports meloxicam only worked for short time. Will defer to sports medicine on MRI. R worse than L. 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

## 2020-06-05 ENCOUNTER — Encounter: Payer: Self-pay | Admitting: Family Medicine

## 2020-06-05 DIAGNOSIS — R809 Proteinuria, unspecified: Secondary | ICD-10-CM | POA: Insufficient documentation

## 2020-06-06 NOTE — Progress Notes (Signed)
[place]   [phone] Fax:([phone]) [phone]   Clinic Follow up Note   Patient Care Team: [name], DO as PCP - General (Family Medicine) [name], MD as Consulting Physician (Colon and Rectal Surgery) [name], MD as Consulting Physician (Hematology)  Date of Service:  06/13/2020  CHIEF COMPLAINT: F/u on colon cancer  SUMMARY OF ONCOLOGIC HISTORY: Oncology History Overview Note  Cancer Staging Cancer of right colon [facility]) Staging form: Colon and Rectum, AJCC 8th Edition - Pathologic stage from 10/21/2018: Stage IVC (pT4b, pN1a, pM1c) - Signed by [name], MD on 00/07/6760  Follicular lymphoma ([place]), History of Staging form: Lymphoid Neoplasms, AJCC 6th Edition - Clinical: Stage II - Signed by Curt Bears, MD on 9/50/9326     Follicular lymphoma [facility]), History of  11/2009 Initial Diagnosis   Follicular lymphoma ([place]), History of    Chemotherapy   1) Status post 6 cycles of systemic chemotherapy with CHOP/Rituxan last dose given 05/01/2009.  2) Maintenance Rituxan at 375 mg per meter square given every 2 months status post 12 cycles    Cancer of right colon ([place])  10/21/2018 Surgery   Exploratory laparotomy right hemicolectomy by Dr. [name] and Dr. [name]  10/21/18   10/21/2018 Pathology Results   Diagnosis 10/21/18 Colon, segmental resection for tumor, right ascending and appendix - ADENOCARCINOMA, MODERATE TO POORLY DIFFERENTIATED (4 CM) - METASTATIC CARCINOMA INVOLVING ONE OF EIGHTEEN LYMPH NODES (1/18) - CARCINOMA EXTENDS INTO THE APPENDIX - TWO TUMOR DEPOSITS PRESENT - SEE ONCOLOGY TABLE AND COMMENT BELOW   10/21/2018 Cancer Staging   Staging form: Colon and Rectum, AJCC 8th Edition - Pathologic stage from 10/21/2018: Stage IVC (pT4b, pN1a, pM1c) - Signed by [name], MD on 11/13/2018   11/04/2018 Imaging   CT AP W Contrast 11/04/18  IMPRESSION: 1. Interval appendectomy. There is residual soft tissue  thickening and stranding in the right lower quadrant adjacent to the cecum which may represent ongoing inflammation versus postsurgical changes. A small soft tissue density adjacent to the surgical sutures may reflect small hematoma or operative collection. No large focal fluid collection to suggest drainable abscess allowing for absence of contrast 2. Fluid-filled colon without wall thickening, could reflect diarrheal process   11/08/2018 Imaging   CT Chest W Contrast 11/08/18  IMPRESSION: 1. No acute findings are noted in the thorax to account for the patient's symptoms. 2. Aortic atherosclerosis, in addition to left main and 3 vessel coronary artery disease. Please note that although the presence of coronary artery calcium documents the presence of coronary artery disease, the severity of this disease and any potential stenosis cannot be assessed on this non-gated CT examination. Assessment for potential risk factor modification, dietary therapy or pharmacologic therapy may be warranted, if clinically indicated. 3. Additional incidental findings, as above. Aortic Atherosclerosis (ICD10-I70.0).   11/12/2018 Initial Diagnosis   Cancer of right colon ([place])   11/25/2018 - 05/05/2019 Chemotherapy   FOLFOX q2weeks starting 11/25/18. Due to moderate thrombocytopenia, I will stop 5-FU bolus, and reduce pump infusion to 2268m/m2 starting with cycle 7.  Plan for last treatment on 05/05/19.    02/10/2019 Imaging   CT AP W Contrast  IMPRESSION: 1. No evidence metastatic disease. 2. Hepatic steatosis. 3. Mildly enlarged prostate. 4. Aortic atherosclerosis (ICD10-170.0). coronary artery calcification.   09/02/2019 Imaging   CT AP W Contrast IMPRESSION: 1. No signs to suggest metastatic disease in the abdomen or pelvis. 2. Aortic atherosclerosis these, as well as right coronary artery disease.  03/01/2020 Imaging   CT CAP W Contrast  IMPRESSION: 1. Stable exam. No new or progressive  findings to suggest recurrent or metastatic disease. 2. Status post right hemicolectomy. 3. Aortic Atherosclerosis (ICD10-I70.0).      CURRENT THERAPY:  Surveillance  INTERVAL HISTORY:  Blake Vaughn is here for a follow up pf colon cancer. He presents to the clinic with his Spanish interpretor Gregary Signs. He notes he is doing well. He denies abdominal issues or pain unless he eats something that he cannot pass well. He notes having normal BMs. He notes he forgot to take his DM medications this morning. He notes he has only mild residual neuropathy. This is controlled with Gabapentin.    REVIEW OF SYSTEMS:   Constitutional: Denies fevers, chills or abnormal weight loss Eyes: Denies blurriness of vision Ears, nose, mouth, throat, and face: Denies mucositis or sore throat Respiratory: Denies cough, dyspnea or wheezes Cardiovascular: Denies palpitation, chest discomfort or lower extremity swelling Gastrointestinal:  Denies nausea, heartburn or change in bowel habits Skin: Denies abnormal skin rashes Lymphatics: Denies new lymphadenopathy or easy bruising Neurological: (+) residual neuropathy  Behavioral/Psych: Mood is stable, no new changes  All other systems were reviewed with the patient and are negative.  MEDICAL HISTORY:  Past Medical History:  Diagnosis Date  . Appendicitis 10/21/2018  . Diabetes mellitus   . Follicular lymphoma ([place]), 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   Qualifier: Diagnosis of  By: [name], [name]   . GI bleed 11/05/2018  . Hypercholesterolemia   . Hypertension 11/27/2011  . Lymphoma [facility])    gets annual chemo last tx Feb 2013  . TOBACCO USE, QUIT 08/18/2009   Qualifier: Diagnosis of  By: Drue Flirt  MD, Merrily Brittle      SURGICAL HISTORY: Past Surgical History:  Procedure Laterality Date  . ESOPHAGOGASTRODUODENOSCOPY (EGD) WITH PROPOFOL N/A 11/05/2018   Procedure: ESOPHAGOGASTRODUODENOSCOPY (EGD) WITH PROPOFOL;  Surgeon: [name], MD;  Location: [facility];   Service: Endoscopy;  Laterality: N/A;  . IR IMAGING GUIDED PORT INSERTION  11/24/2018  . LAPAROSCOPIC APPENDECTOMY N/A 10/21/2018   Procedure: Exploratory laparotomy right hemicolectomy;  Surgeon: [name], MD;  Location: [facility];  Service: General;  Laterality: N/A;    I have reviewed the social history and family history with the patient and they are unchanged from previous note.  ALLERGIES:  is allergic to iohexol.  MEDICATIONS:  Current Outpatient Medications  Medication Sig Dispense Refill  . Accu-Chek Softclix Lancets lancets Use as instructed 100 each 12  . atorvastatin (LIPITOR) 40 MG tablet Take 1 tablet (40 mg total) by mouth daily. 90 tablet 3  . Blood Glucose Monitoring Suppl (ACCU-CHEK AVIVA PLUS) w/Device KIT Inject 1 kit into the skin 2 (two) times daily. 1 kit 0  . diphenhydrAMINE (BENADRYL) 50 MG tablet Take 1 tablet (50 mg total) by mouth as directed. Take 1 tablet 1 hour prior to Ct Scan 1 tablet 0  . gabapentin (NEURONTIN) 100 MG capsule Take by mouth 3 (three) times daily.    Marland Kitchen gabapentin (NEURONTIN) 300 MG capsule Take 1 capsule (300 mg total) by mouth 3 (three) times daily. 90 capsule 3  . glipiZIDE (GLUCOTROL XL) 10 MG 24 hr tablet TAKE 1 TABLET BY MOUTH ONCE DAILY BREAKFAST. 90 tablet 0  . glucose blood (ACCU-CHEK AVIVA PLUS) test strip Use as instructed 100 each 12  . hydrOXYzine (ATARAX/VISTARIL) 25 MG tablet Take 1 tablet (25 mg total) by mouth every 6 (six) hours. 12 tablet 0  .  lidocaine-prilocaine (EMLA) cream Apply 1 application topically as needed. 30 g 1  . metFORMIN (GLUCOPHAGE) 1000 MG tablet Take 1 tablet (1,000 mg total) by mouth 2 (two) times daily with a meal. 180 tablet 3  . predniSONE (DELTASONE) 50 MG tablet Take 1 tablet 13 hours before ct scan, 1 tablet 7 hours before ct scan, and 1 tablet 1 hour before ct scan 3 tablet 0  . sertraline (ZOLOFT) 50 MG tablet Take 1 tablet (50 mg total) by mouth daily. 30 tablet 0  . sitaGLIPtin  (JANUVIA) 50 MG tablet Take 1 tablet (50 mg total) by mouth daily. 90 tablet 3   No current facility-administered medications for this visit.    PHYSICAL EXAMINATION: ECOG PERFORMANCE STATUS: 0 - Asymptomatic  Vitals:   06/13/20 1009  BP: 127/73  Pulse: 73  Resp: 18  Temp: 98.6 F (37 C)  SpO2: 98%   Filed Weights   06/13/20 1009  Weight: 200 lb (90.7 kg)    GENERAL:alert, no distress and comfortable SKIN: skin color, texture, turgor are normal, no rashes or significant lesions EYES: normal, Conjunctiva are pink and non-injected, sclera clear  NECK: supple, thyroid normal size, non-tender, without nodularity LYMPH:  no palpable lymphadenopathy in the cervical, axillary  LUNGS: clear to auscultation and percussion with normal breathing effort HEART: regular rate & rhythm and no murmurs and no lower extremity edema ABDOMEN:abdomen soft, non-tender and normal bowel sounds Musculoskeletal:no cyanosis of digits and no clubbing  NEURO: alert & oriented x 3 with fluent speech, no focal motor/sensory deficits  LABORATORY DATA:  I have reviewed the data as listed CBC Latest Ref Rng & Units 06/13/2020 03/12/2020 03/01/2020  WBC 4.0 - 10.5 K/uL 10.5 10.5 8.8  Hemoglobin 13.0 - 17.0 g/dL 13.9 13.4 14.5  Hematocrit 39 - 52 % 40.7 39.0 42.0  Platelets 150 - 400 K/uL 179 212 204     CMP Latest Ref Rng & Units 06/13/2020 03/12/2020 03/01/2020  Glucose 70 - 99 mg/dL 271(H) 172(H) 374(H)  BUN 6 - 20 mg/dL 21(H) 18 16  Creatinine 0.61 - 1.24 mg/dL 1.03 0.79 1.01  Sodium 135 - 145 mmol/L 134(L) 138 138  Potassium 3.5 - 5.1 mmol/L 4.7 4.5 5.1  Chloride 98 - 111 mmol/L 103 103 103  CO2 22 - 32 mmol/L 21(L) 24 23  Calcium 8.9 - 10.3 mg/dL 9.0 9.4 9.2  Total Protein 6.5 - 8.1 g/dL 6.5 6.6 6.7  Total Bilirubin 0.3 - 1.2 mg/dL 0.4 0.4 0.7  Alkaline Phos 38 - 126 U/L 73 97 93  AST 15 - 41 U/L '19 15 18  ' ALT 0 - 44 U/L '22 28 26      ' RADIOGRAPHIC STUDIES: I have personally reviewed the  radiological images as listed and agreed with the findings in the report. No results found.   ASSESSMENT & PLAN:  Blake Vaughn is a 59 y.o. male with    1.RightCecal adenocarcinoma,pT4bN1aM1c,Stage IVC,with limited peritoneal metastasis,resected,Grade II-III, MMR normal -Diagnosed in 10/2018. Treated with right hemicolectomy.He was found to have 2 smallperitoneal metastasis during the surgery, which were removed. -He completed 12 cycles of adjuvant FOLFOX.Now on surveillance.  -Given very high risk of recurrence, I recommend he keep his PAC in placefor 1-2 years. Will continue port flush every 6-8 weeks -He is clinically doing well without any concerning symptoms. Labs reviewed, CBC and CMP WNL except BG 271. Physical exam unremarkable.  -He is over 1.5 years since his cancer diagnosis. Will continue surveillance. Next CT AP  before next visit, then yearly. He will take Prednisone and Benadryl before scan as prescribed due to his allergy to iv contrast.  -F/u in 3 months.    2. H/o Follicular Lymphoma -Was treated with CHOP/Rituxan in 2010.Then he waspreviouslytreated withMaintenance Rituxancompleted in 2012.Last f/u was in 2017.  3. DM and Hypercholesterolemia, uncontrolled hyperglycemia -On metformin,Glimepiride,Lipitor and metoprolol.  -His CT AP from 09/02/19 also shows aortic atherosclerosis, which can increase his risk for MI. -He does use Prednisone before CT scans. He is also to hold metformin 2 days before scan. I advised him to closely watch his BG at home.  -He did not take his MD medications this morning yet, BG at 271 today (06/13/20).  -His weight is stable although he has been walking more. I recommend reducing carbohydrates in diet and increase lean proteins.   5. Neuropathy, G1  -Secondary toprior chemo Oxaliplatin  -He still has very mild residual neuropathy.  -Will continue Gabapentin   Plan -I refilled Prednisone today. Will take  with Benadryl before scan and hold Metformin 2 days before scan. He knows to drink more water before and after his CT scan  -F/u in 3 months with lab, flush and CT AP w contrast a few days before  -port flush in 6 weeks.    No problem-specific Assessment & Plan notes found for this encounter.   Orders Placed This Encounter  Procedures  . CT Abdomen Pelvis W Contrast    Standing Status:   Future    Standing Expiration Date:   06/13/2021    Order Specific Question:   If indicated for the ordered procedure, I authorize the administration of contrast media per Radiology protocol    Answer:   Yes    Order Specific Question:   Preferred imaging location?    Answer:   [facility]    Order Specific Question:   Release to patient    Answer:   Immediate    Order Specific Question:   Is Oral Contrast requested for this exam?    Answer:   Yes, Per Radiology protocol    Order Specific Question:   Radiology Contrast Protocol - do NOT remove file path    Answer:   [email or web address]   All questions were answered. The patient knows to call the clinic with any problems, questions or concerns. No barriers to learning was detected. The total time spent in the appointment was 30 minutes.     [name], MD 06/13/2020   I, Joslyn Devon, am acting as scribe for [name], MD.   I have reviewed the above documentation for accuracy and completeness, and I agree with the above.

## 2020-06-13 ENCOUNTER — Telehealth: Payer: Self-pay | Admitting: Hematology

## 2020-06-13 ENCOUNTER — Other Ambulatory Visit: Payer: Self-pay

## 2020-06-13 ENCOUNTER — Inpatient Hospital Stay: Payer: Medicaid Other

## 2020-06-13 ENCOUNTER — Encounter: Payer: Self-pay | Admitting: Hematology

## 2020-06-13 ENCOUNTER — Inpatient Hospital Stay: Payer: Medicaid Other | Attending: Hematology | Admitting: Hematology

## 2020-06-13 VITALS — BP 127/73 | HR 73 | Temp 98.6°F | Resp 18 | Ht 65.0 in | Wt 200.0 lb

## 2020-06-13 DIAGNOSIS — E7800 Pure hypercholesterolemia, unspecified: Secondary | ICD-10-CM | POA: Insufficient documentation

## 2020-06-13 DIAGNOSIS — C182 Malignant neoplasm of ascending colon: Secondary | ICD-10-CM | POA: Diagnosis present

## 2020-06-13 DIAGNOSIS — E1140 Type 2 diabetes mellitus with diabetic neuropathy, unspecified: Secondary | ICD-10-CM | POA: Insufficient documentation

## 2020-06-13 DIAGNOSIS — I700 Atherosclerosis of aorta: Secondary | ICD-10-CM | POA: Insufficient documentation

## 2020-06-13 DIAGNOSIS — Z79899 Other long term (current) drug therapy: Secondary | ICD-10-CM | POA: Insufficient documentation

## 2020-06-13 DIAGNOSIS — E1165 Type 2 diabetes mellitus with hyperglycemia: Secondary | ICD-10-CM | POA: Insufficient documentation

## 2020-06-13 DIAGNOSIS — D500 Iron deficiency anemia secondary to blood loss (chronic): Secondary | ICD-10-CM

## 2020-06-13 DIAGNOSIS — Z7952 Long term (current) use of systemic steroids: Secondary | ICD-10-CM | POA: Diagnosis not present

## 2020-06-13 DIAGNOSIS — Z8572 Personal history of non-Hodgkin lymphomas: Secondary | ICD-10-CM | POA: Insufficient documentation

## 2020-06-13 DIAGNOSIS — Z91041 Radiographic dye allergy status: Secondary | ICD-10-CM

## 2020-06-13 DIAGNOSIS — Z452 Encounter for adjustment and management of vascular access device: Secondary | ICD-10-CM | POA: Diagnosis present

## 2020-06-13 DIAGNOSIS — C786 Secondary malignant neoplasm of retroperitoneum and peritoneum: Secondary | ICD-10-CM | POA: Diagnosis not present

## 2020-06-13 DIAGNOSIS — C8200 Follicular lymphoma grade I, unspecified site: Secondary | ICD-10-CM

## 2020-06-13 DIAGNOSIS — Z95828 Presence of other vascular implants and grafts: Secondary | ICD-10-CM

## 2020-06-13 LAB — CMP (CANCER CENTER ONLY)
ALT: 22 U/L (ref 0–44)
AST: 19 U/L (ref 15–41)
Albumin: 3.6 g/dL (ref 3.5–5.0)
Alkaline Phosphatase: 73 U/L (ref 38–126)
Anion gap: 10 (ref 5–15)
BUN: 21 mg/dL — ABNORMAL HIGH (ref 6–20)
CO2: 21 mmol/L — ABNORMAL LOW (ref 22–32)
Calcium: 9 mg/dL (ref 8.9–10.3)
Chloride: 103 mmol/L (ref 98–111)
Creatinine: 1.03 mg/dL (ref 0.61–1.24)
GFR, Est AFR Am: >60 mL/min (ref >60)
GFR, Estimated: >60 mL/min (ref >60)
Glucose, Bld: 271 mg/dL — ABNORMAL HIGH (ref 70–99)
Potassium: 4.7 mmol/L (ref 3.5–5.1)
Sodium: 134 mmol/L — ABNORMAL LOW (ref 135–145)
Total Bilirubin: 0.4 mg/dL (ref 0.3–1.2)
Total Protein: 6.5 g/dL (ref 6.5–8.1)

## 2020-06-13 LAB — IRON AND TIBC
Iron: 87 ug/dL (ref 42–163)
Saturation Ratios: 23 % (ref 20–55)
TIBC: 370 ug/dL (ref 202–409)
UIBC: 283 ug/dL (ref 117–376)

## 2020-06-13 LAB — CBC WITH DIFFERENTIAL (CANCER CENTER ONLY)
Abs Immature Granulocytes: 0.03 10*3/uL (ref 0.00–0.07)
Basophils Absolute: 0 10*3/uL (ref 0.0–0.1)
Basophils Relative: 0 %
Eosinophils Absolute: 0.4 10*3/uL (ref 0.0–0.5)
Eosinophils Relative: 4 %
HCT: 40.7 % (ref 39.0–52.0)
Hemoglobin: 13.9 g/dL (ref 13.0–17.0)
Immature Granulocytes: 0 %
Lymphocytes Relative: 23 %
Lymphs Abs: 2.4 10*3/uL (ref 0.7–4.0)
MCH: 29.9 pg (ref 26.0–34.0)
MCHC: 34.2 g/dL (ref 30.0–36.0)
MCV: 87.5 fL (ref 80.0–100.0)
Monocytes Absolute: 0.8 10*3/uL (ref 0.1–1.0)
Monocytes Relative: 8 %
Neutro Abs: 6.9 10*3/uL (ref 1.7–7.7)
Neutrophils Relative %: 65 %
Platelet Count: 179 10*3/uL (ref 150–400)
RBC: 4.65 MIL/uL (ref 4.22–5.81)
RDW: 12.3 % (ref 11.5–15.5)
WBC Count: 10.5 10*3/uL (ref 4.0–10.5)
nRBC: 0 % (ref 0.0–0.2)

## 2020-06-13 LAB — FERRITIN: Ferritin: 97 ng/mL (ref 24–336)

## 2020-06-13 LAB — CEA (IN HOUSE-CHCC): CEA (CHCC-In House): 3.26 ng/mL (ref 0.00–5.00)

## 2020-06-13 MED ORDER — PREDNISONE 50 MG PO TABS: ORAL_TABLET | ORAL | 0 refills | Status: DC

## 2020-06-13 MED ORDER — SODIUM CHLORIDE 0.9% FLUSH
10.0000 mL | INTRAVENOUS | Status: DC | PRN
  Administered 2020-06-13: 10 mL
  Filled 2020-06-13: qty 10

## 2020-06-13 MED ORDER — HEPARIN SOD (PORK) LOCK FLUSH 100 UNIT/ML IV SOLN
500.0000 [IU] | Freq: Once | INTRAVENOUS | Status: AC | PRN
  Administered 2020-06-13: 500 [IU]
  Filled 2020-06-13: qty 5

## 2020-06-13 MED FILL — predniSONE 50 MG TABS: 50 | 1 days supply | Qty: 3 | Fill #0

## 2020-06-13 NOTE — Telephone Encounter (Signed)
Scheduled per 07/07 los, patient received updated scheduled calender.

## 2020-06-19 ENCOUNTER — Telehealth: Payer: Self-pay

## 2020-06-19 NOTE — Telephone Encounter (Signed)
-----   Message from [name], MD sent at 06/15/2020  8:10 AM EDT ----- Please let pt know his tumor marker and iron level were all normal, no concerns, thanks   [name]  06/15/2020

## 2020-06-19 NOTE — Telephone Encounter (Signed)
Called Mr. Hilligoss with the interpreter services.  [place] interpreter number [phone] called and left a message that his lab results were normal.  Tumor marker and iron levels were normal and there are no concerns.  Advised if he has questions, he can call us at [phone].  Gardiner Rhyme, RN

## 2020-07-25 ENCOUNTER — Inpatient Hospital Stay: Payer: Medicaid Other | Attending: Hematology

## 2020-07-25 ENCOUNTER — Other Ambulatory Visit: Payer: Self-pay

## 2020-07-25 DIAGNOSIS — Z452 Encounter for adjustment and management of vascular access device: Secondary | ICD-10-CM | POA: Diagnosis not present

## 2020-07-25 DIAGNOSIS — C182 Malignant neoplasm of ascending colon: Secondary | ICD-10-CM | POA: Insufficient documentation

## 2020-07-25 DIAGNOSIS — Z95828 Presence of other vascular implants and grafts: Secondary | ICD-10-CM

## 2020-07-25 MED ORDER — HEPARIN SOD (PORK) LOCK FLUSH 100 UNIT/ML IV SOLN
500.0000 [IU] | Freq: Once | INTRAVENOUS | Status: AC | PRN
  Administered 2020-07-25: 500 [IU]
  Filled 2020-07-25: qty 5

## 2020-07-25 MED ORDER — SODIUM CHLORIDE 0.9% FLUSH
10.0000 mL | INTRAVENOUS | Status: DC | PRN
  Administered 2020-07-25: 10 mL
  Filled 2020-07-25: qty 10

## 2020-07-25 NOTE — Patient Instructions (Signed)

## 2020-08-05 ENCOUNTER — Other Ambulatory Visit: Payer: Self-pay

## 2020-08-05 ENCOUNTER — Emergency Department (HOSPITAL_COMMUNITY)
Admission: EM | Admit: 2020-08-05 | Discharge: 2020-08-06 | Disposition: A | Discharge status: Home / Self Care | Payer: Medicaid Other | Attending: Emergency Medicine | Admitting: Emergency Medicine

## 2020-08-05 DIAGNOSIS — R519 Headache, unspecified: Secondary | ICD-10-CM | POA: Insufficient documentation

## 2020-08-05 DIAGNOSIS — Z5321 Procedure and treatment not carried out due to patient leaving prior to being seen by health care provider: Secondary | ICD-10-CM | POA: Diagnosis not present

## 2020-08-05 LAB — CBG MONITORING, ED: Glucose-Capillary: 292 mg/dL — ABNORMAL HIGH (ref 70–99)

## 2020-08-05 NOTE — ED Triage Notes (Signed)
Per pt he has been having a head ache x 2 days. Pt said hurts on the top of his head. Pt said he is a diabetic and has  been taking his medication but not checking his sugars

## 2020-08-06 NOTE — ED Notes (Signed)
Pt did not answer for vitals x3

## 2020-08-07 ENCOUNTER — Telehealth: Payer: Self-pay | Admitting: *Deleted

## 2020-08-07 NOTE — Telephone Encounter (Signed)
Blake Vaughn presented to the ED and left before being seen by the provider on 08/04/20 The patient has been enrolled in an automated general discharge outreach program and 2 attempts to contact the patient will be made to follow up on their ED visit and subsequent needs. The care management team is available to provide assistance to this patient at any time.   Lenor Coffin, RN, BSN, [facility] [place] [phone]

## 2020-08-31 ENCOUNTER — Encounter (HOSPITAL_COMMUNITY): Payer: Self-pay | Admitting: *Deleted

## 2020-08-31 ENCOUNTER — Emergency Department (HOSPITAL_COMMUNITY)
Admission: EM | Admit: 2020-08-31 | Discharge: 2020-09-01 | Disposition: A | Discharge status: Home / Self Care | Payer: Medicaid Other | Attending: Emergency Medicine | Admitting: Emergency Medicine

## 2020-08-31 ENCOUNTER — Other Ambulatory Visit: Payer: Self-pay

## 2020-08-31 ENCOUNTER — Emergency Department (HOSPITAL_COMMUNITY): Payer: Medicaid Other

## 2020-08-31 DIAGNOSIS — E119 Type 2 diabetes mellitus without complications: Secondary | ICD-10-CM | POA: Diagnosis not present

## 2020-08-31 DIAGNOSIS — R079 Chest pain, unspecified: Secondary | ICD-10-CM | POA: Diagnosis not present

## 2020-08-31 DIAGNOSIS — R109 Unspecified abdominal pain: Secondary | ICD-10-CM | POA: Diagnosis not present

## 2020-08-31 DIAGNOSIS — Z5321 Procedure and treatment not carried out due to patient leaving prior to being seen by health care provider: Secondary | ICD-10-CM | POA: Insufficient documentation

## 2020-08-31 DIAGNOSIS — R519 Headache, unspecified: Secondary | ICD-10-CM | POA: Insufficient documentation

## 2020-08-31 LAB — BASIC METABOLIC PANEL
Anion gap: 9 (ref 5–15)
BUN: 17 mg/dL (ref 6–20)
CO2: 24 mmol/L (ref 22–32)
Calcium: 9.2 mg/dL (ref 8.9–10.3)
Chloride: 102 mmol/L (ref 98–111)
Creatinine, Ser: 1.04 mg/dL (ref 0.61–1.24)
GFR calc Af Amer: >60 mL/min (ref >60)
GFR calc non Af Amer: >60 mL/min (ref >60)
Glucose, Bld: 215 mg/dL — ABNORMAL HIGH (ref 70–99)
Potassium: 4.1 mmol/L (ref 3.5–5.1)
Sodium: 135 mmol/L (ref 135–145)

## 2020-08-31 LAB — CBC
HCT: 39.5 % (ref 39.0–52.0)
Hemoglobin: 13.2 g/dL (ref 13.0–17.0)
MCH: 30 pg (ref 26.0–34.0)
MCHC: 33.4 g/dL (ref 30.0–36.0)
MCV: 89.8 fL (ref 80.0–100.0)
Platelets: 188 10*3/uL (ref 150–400)
RBC: 4.4 MIL/uL (ref 4.22–5.81)
RDW: 12.5 % (ref 11.5–15.5)
WBC: 8.7 10*3/uL (ref 4.0–10.5)
nRBC: 0 % (ref 0.0–0.2)

## 2020-08-31 LAB — CBG MONITORING, ED: Glucose-Capillary: 229 mg/dL — ABNORMAL HIGH (ref 70–99)

## 2020-08-31 LAB — TROPONIN I (HIGH SENSITIVITY): Troponin I (High Sensitivity): 19 ng/L — ABNORMAL HIGH (ref <18)

## 2020-08-31 IMAGING — DX DG CHEST 2V
2 series · 2 of 2 positions shown · non-contrast
Comparison: Chest CT [DATE]

CLINICAL DATA: Chest pain.  Abdominal pain and headache.

EXAM:
CHEST - 2 VIEW

[w chest pa (1 of 2)]
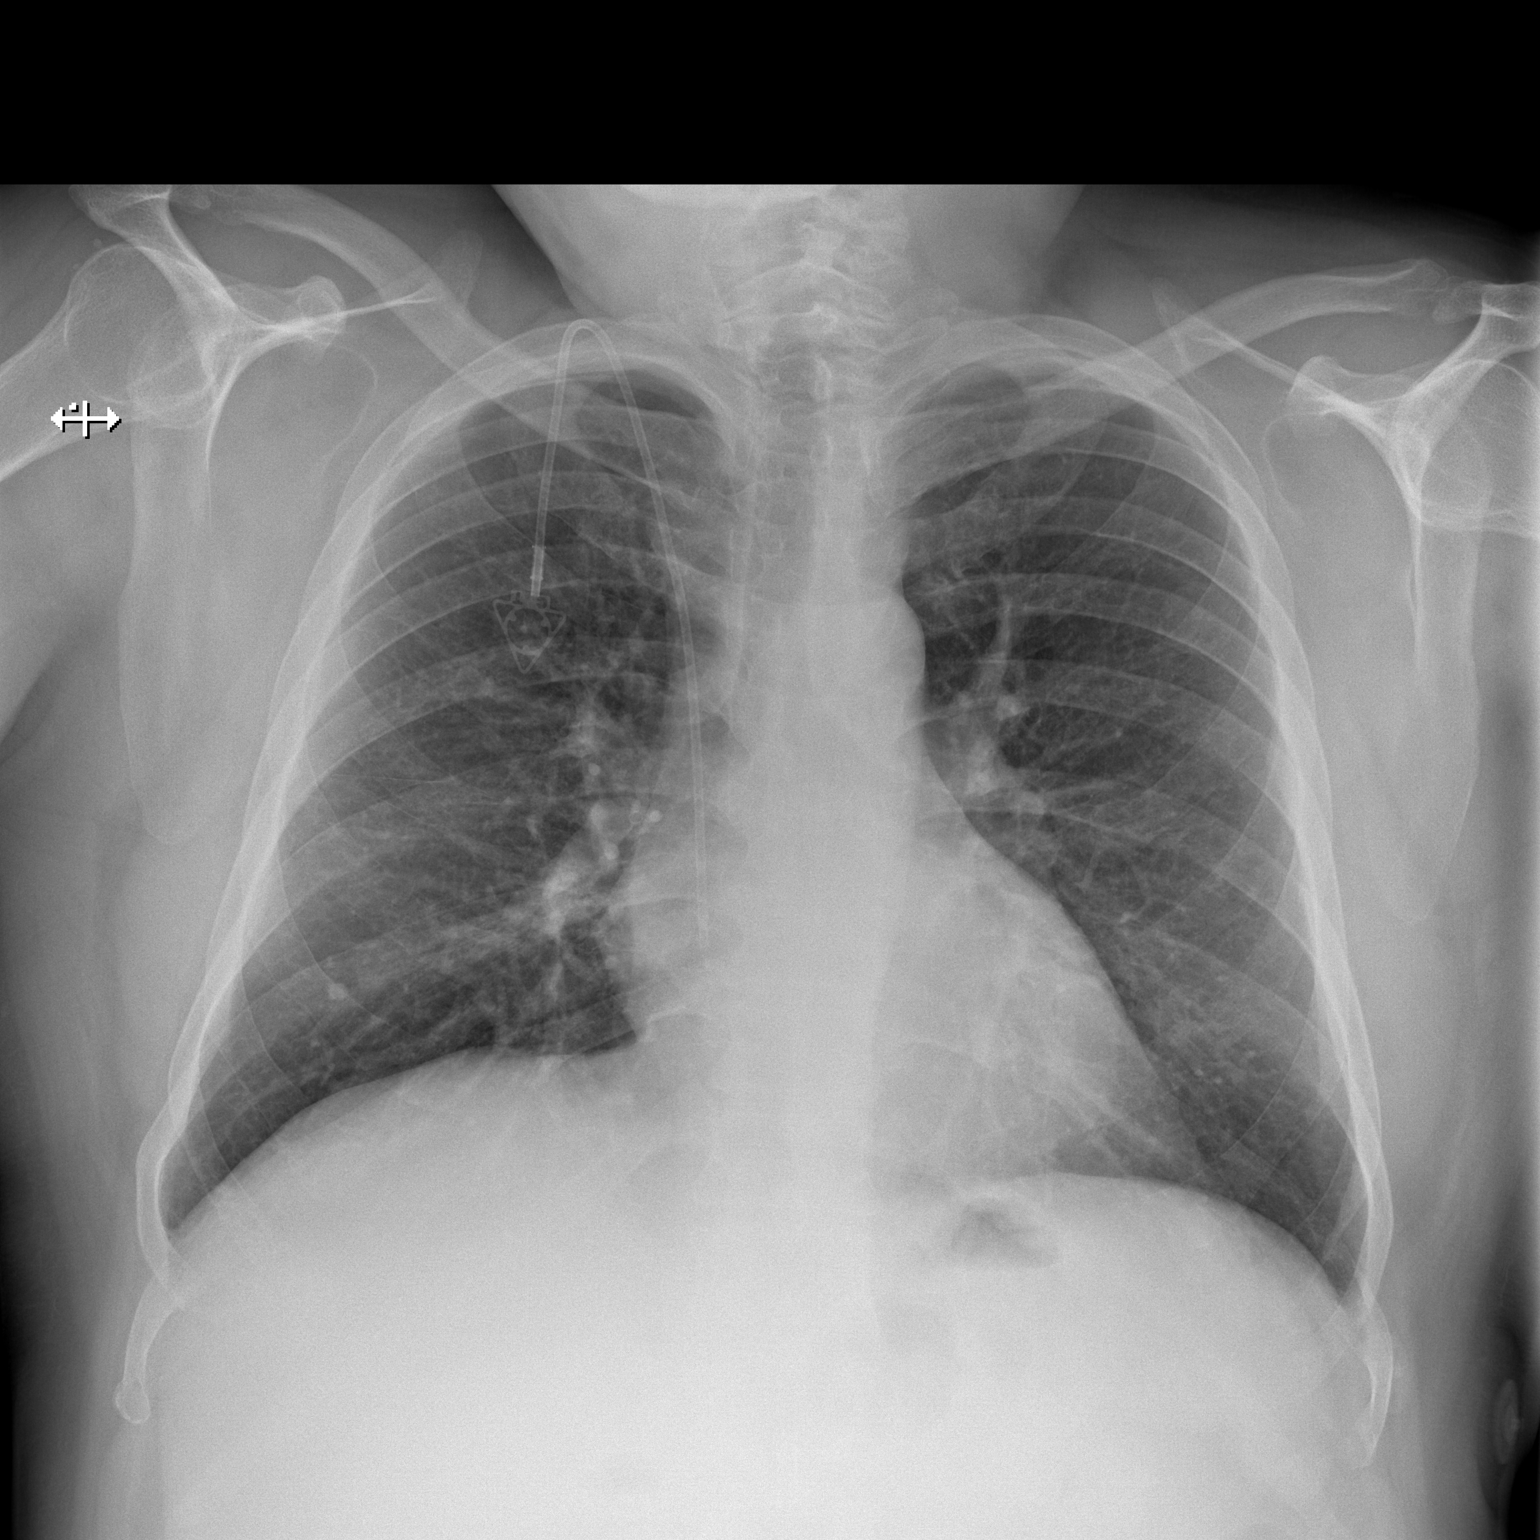

[w chest pa (2 of 2)]
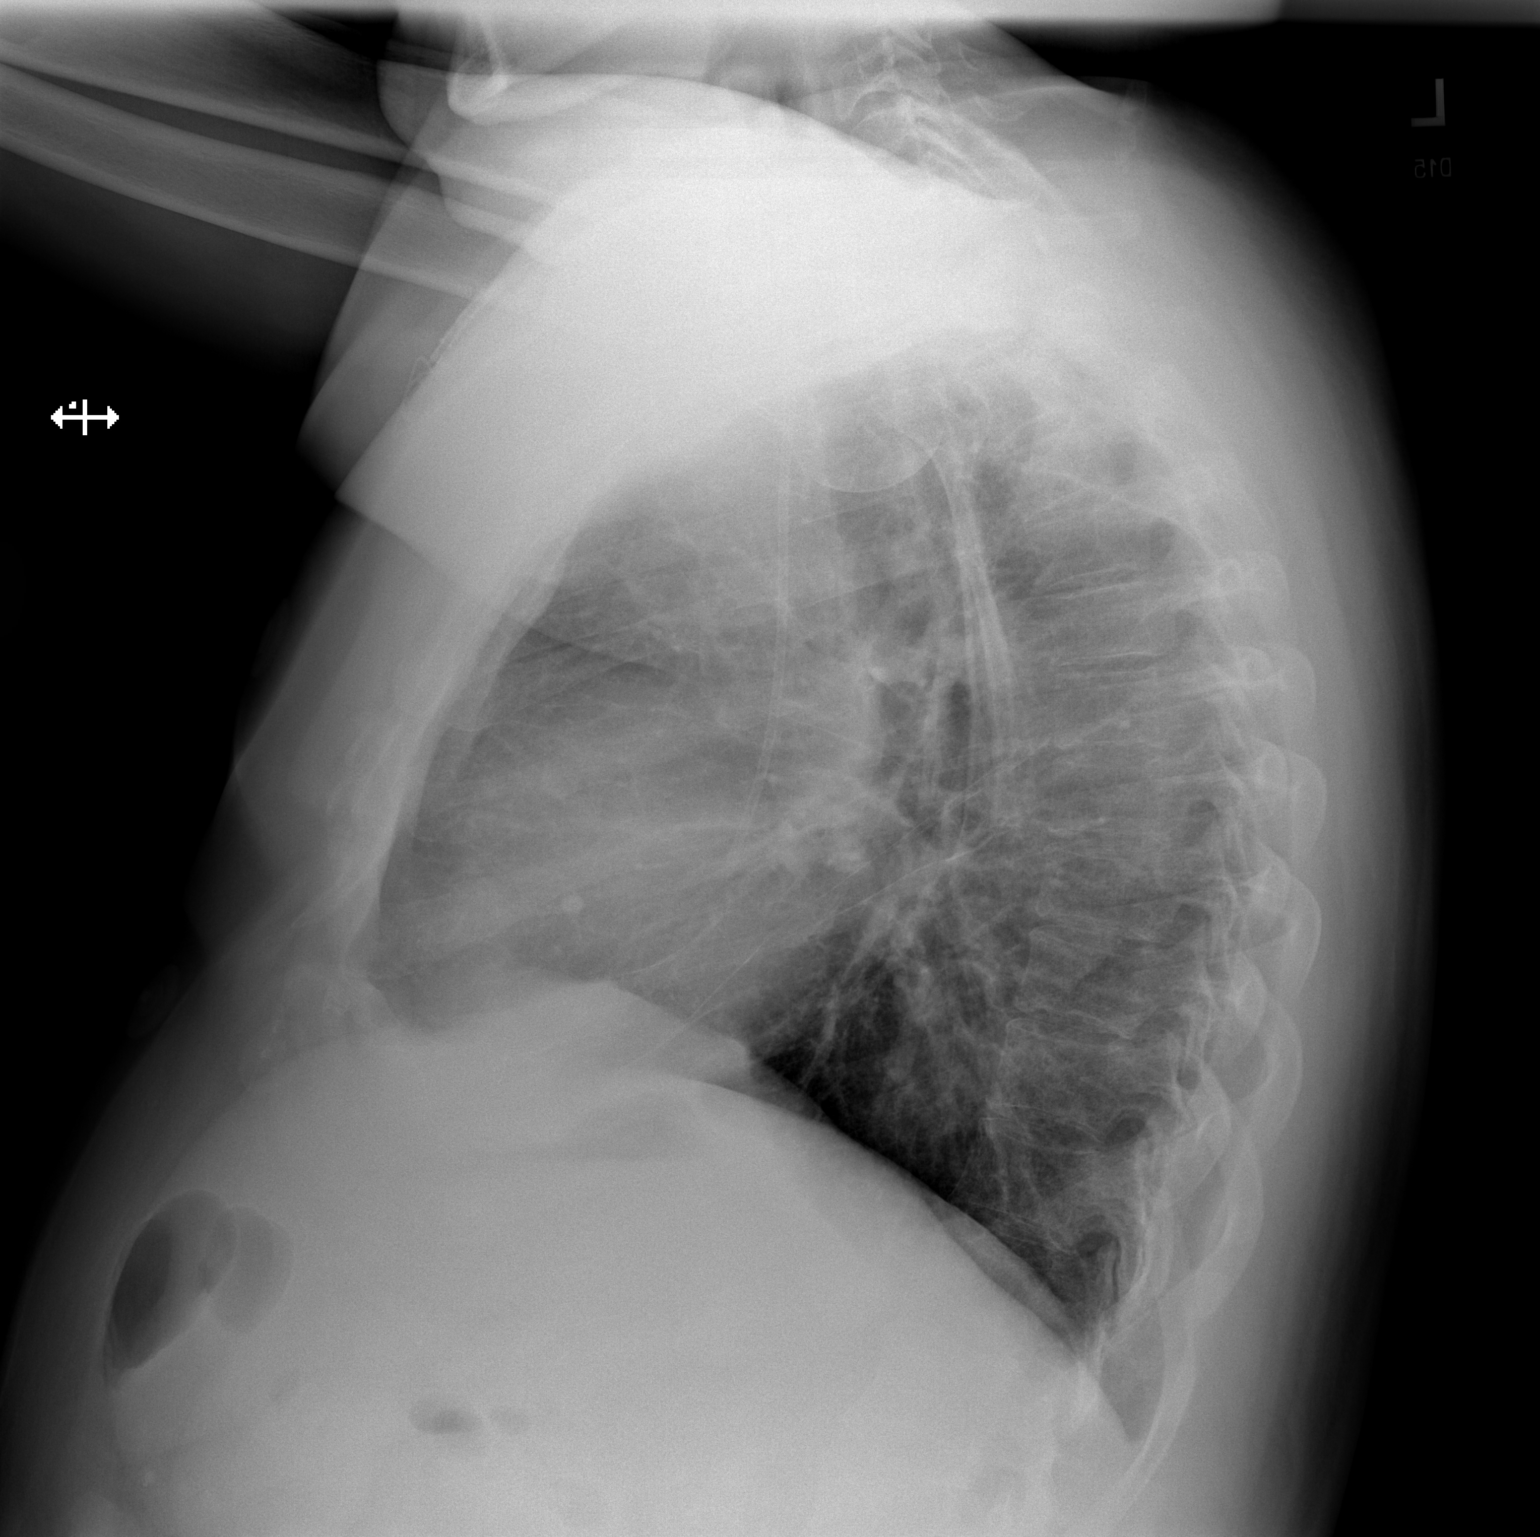

[2 of 2 positions shown; findings below may reference images not displayed]

FINDINGS: Right chest port with tip in the SVC. Calcified granuloma in the
right middle lobe. The cardiomediastinal contours are normal.
Pulmonary vasculature is normal. No consolidation, pleural effusion,
or pneumothorax. No acute osseous abnormalities are seen.
IMPRESSION: No acute chest findings.

## 2020-08-31 NOTE — ED Triage Notes (Signed)
To ED for eval of cp, abd pain, and HA - intermittent. Started yesterday. No pain now. States he has diabetes but unknown how sugars have been - doesn't check. CBG in triage 229. No nausea. No vomiting. No cp now.

## 2020-09-01 NOTE — ED Notes (Signed)
Pt upset and said he was going home.

## 2020-09-01 NOTE — ED Notes (Signed)
Pt seen coming back in the waiting room from outside.

## 2020-09-01 NOTE — ED Notes (Signed)
Pt LWBS 

## 2020-09-03 ENCOUNTER — Telehealth: Payer: Self-pay

## 2020-09-03 NOTE — Telephone Encounter (Signed)
Transition Care Management Follow-up Telephone Call  Date of discharge and from where: 08/31/2020 from [facility] and pt LWBS  How have you been since you were released from the hospital? Patient statest that he is feeling better.   Any questions or concerns? No  Items Reviewed:  Did the pt receive and understand the discharge instructions provided? N/A  Medications obtained and verified? Yes   Any new allergies since your discharge? No   Dietary orders reviewed? Yes  Do you have support at home? Yes   Functional Questionnaire: (I = Independent and D = Dependent) ADLs: I Bathing/Dressing- I Meal Prep- I Eating- I Maintaining continence- I Transferring/Ambulation- I Managing Meds- I  Follow up appointments reviewed:   PCP Hospital f/u appt confirmed?   [facility] f/u appt confirmed? Yes  Scheduled to see Oncology on 09/07/20 @ 02:30pm.  Are transportation arrangements needed? No   If their condition worsens, is the pt aware to call PCP or go to the Emergency Dept.? Yes  Was the patient provided with contact information for the PCP's office or ED? Yes  Was to pt encouraged to call back with questions or concerns? Yes   Interpreter was used for this call - PCP was contacted via interpreter, however, patient hung up before appointment was made.   I called PCP office to inform.

## 2020-09-03 NOTE — Telephone Encounter (Signed)
Received phone call from [facility], regarding patient needing to be scheduled for diabetes follow up.   Called patient with [facility] interpreter, Felicity Coyer [phone], to schedule patient for follow up visit. Patient was recently seen in the ED and LWBS, due to long wait. Of note, patient had elevated troponin level of 19. Patient currently denies chest pain or shortness of breath. Patient voices concerns about unmanaged blood sugar and wants to schedule follow up.   Scheduled patient for next available appointment on Wednesday morning for ED follow up.   Strict ED precautions given.   Talbot Grumbling, RN

## 2020-09-04 NOTE — Progress Notes (Deleted)
    SUBJECTIVE:   CHIEF COMPLAINT / HPI:   Diabetes follow-up Patient seen at ED, CBG elevated to 200s Current regimen: Glipizide 10 mg daily, Metformin 1000 mg twice daily, Januvia 50 mg daily*** CBG*** Last A1c 7.9 on 05/16/2020 Denies polyuria, polydipsia, hypoglycemia***  Elevated troponin at ED Trop 19 on 9/24, left without being seen CXR neg for acute process EKG at that time NSR ***   PERTINENT  PMH / PSH: Hypertension, history of right colon cancer, T2DM, neuropathy, HLD, history of follicular lymphoma  OBJECTIVE:   There were no vitals taken for this visit.   Physical Exam: *** General: 59 y.o. male in NAD Cardio: RRR no m/r/g Lungs: CTAB, no wheezing, no rhonchi, no crackles, no IWOB on *** Abdomen: Soft, non-tender to palpation, non-distended, positive bowel sounds Skin: warm and dry Extremities: No edema   ASSESSMENT/PLAN:   No problem-specific Assessment & Plan notes found for this encounter.     Cleophas Dunker, [place]

## 2020-09-05 ENCOUNTER — Ambulatory Visit: Payer: Medicaid Other

## 2020-09-05 MED FILL — predniSONE 50 MG TABS: 50 | 1 days supply | Qty: 3 | Fill #0

## 2020-09-06 ENCOUNTER — Other Ambulatory Visit: Payer: Self-pay

## 2020-09-06 NOTE — Progress Notes (Signed)
This called to make pt aware of medication regimen for tomorrow accessed and used interpretor line operator steven ID# [phone] made pt aware of 13 hour prep 1 50mg  prednisone at 13 7 and 1 hour before /1 50 mg Benadryl 1 hour before / Drink contrast at 2 hours and 1 hour before scan/ arrive to cancer center at 1430 for labs and port access then go to [facility] for scan at 1530 pt states he understands nothing further call ended

## 2020-09-07 ENCOUNTER — Inpatient Hospital Stay: Payer: Medicaid Other

## 2020-09-07 ENCOUNTER — Inpatient Hospital Stay: Payer: Medicaid Other | Attending: Hematology

## 2020-09-07 ENCOUNTER — Other Ambulatory Visit: Payer: Self-pay

## 2020-09-07 ENCOUNTER — Ambulatory Visit (HOSPITAL_COMMUNITY)
Admission: RE | Admit: 2020-09-07 | Discharge: 2020-09-07 | Disposition: A | Discharge status: Home / Self Care | Payer: Medicaid Other | Source: Ambulatory Visit | Attending: Hematology | Admitting: Hematology

## 2020-09-07 DIAGNOSIS — Z7984 Long term (current) use of oral hypoglycemic drugs: Secondary | ICD-10-CM | POA: Diagnosis not present

## 2020-09-07 DIAGNOSIS — I700 Atherosclerosis of aorta: Secondary | ICD-10-CM | POA: Diagnosis not present

## 2020-09-07 DIAGNOSIS — I10 Essential (primary) hypertension: Secondary | ICD-10-CM | POA: Insufficient documentation

## 2020-09-07 DIAGNOSIS — E119 Type 2 diabetes mellitus without complications: Secondary | ICD-10-CM | POA: Insufficient documentation

## 2020-09-07 DIAGNOSIS — Z8572 Personal history of non-Hodgkin lymphomas: Secondary | ICD-10-CM | POA: Diagnosis present

## 2020-09-07 DIAGNOSIS — Z85038 Personal history of other malignant neoplasm of large intestine: Secondary | ICD-10-CM | POA: Diagnosis not present

## 2020-09-07 DIAGNOSIS — K639 Disease of intestine, unspecified: Secondary | ICD-10-CM | POA: Diagnosis not present

## 2020-09-07 DIAGNOSIS — Z79899 Other long term (current) drug therapy: Secondary | ICD-10-CM | POA: Insufficient documentation

## 2020-09-07 DIAGNOSIS — C182 Malignant neoplasm of ascending colon: Secondary | ICD-10-CM | POA: Diagnosis not present

## 2020-09-07 DIAGNOSIS — Z87891 Personal history of nicotine dependence: Secondary | ICD-10-CM | POA: Insufficient documentation

## 2020-09-07 DIAGNOSIS — E7800 Pure hypercholesterolemia, unspecified: Secondary | ICD-10-CM | POA: Diagnosis not present

## 2020-09-07 DIAGNOSIS — Z95828 Presence of other vascular implants and grafts: Secondary | ICD-10-CM

## 2020-09-07 DIAGNOSIS — K6289 Other specified diseases of anus and rectum: Secondary | ICD-10-CM | POA: Diagnosis not present

## 2020-09-07 DIAGNOSIS — D500 Iron deficiency anemia secondary to blood loss (chronic): Secondary | ICD-10-CM

## 2020-09-07 DIAGNOSIS — C189 Malignant neoplasm of colon, unspecified: Secondary | ICD-10-CM | POA: Diagnosis not present

## 2020-09-07 LAB — CMP (CANCER CENTER ONLY)
ALT: 20 U/L (ref 0–44)
AST: 17 U/L (ref 15–41)
Albumin: 3.7 g/dL (ref 3.5–5.0)
Alkaline Phosphatase: 76 U/L (ref 38–126)
Anion gap: 7 (ref 5–15)
BUN: 19 mg/dL (ref 6–20)
CO2: 22 mmol/L (ref 22–32)
Calcium: 9.3 mg/dL (ref 8.9–10.3)
Chloride: 103 mmol/L (ref 98–111)
Creatinine: 1.03 mg/dL (ref 0.61–1.24)
GFR, Est AFR Am: >60 mL/min (ref >60)
GFR, Estimated: >60 mL/min (ref >60)
Glucose, Bld: 248 mg/dL — ABNORMAL HIGH (ref 70–99)
Potassium: 4.8 mmol/L (ref 3.5–5.1)
Sodium: 132 mmol/L — ABNORMAL LOW (ref 135–145)
Total Bilirubin: 0.4 mg/dL (ref 0.3–1.2)
Total Protein: 6.9 g/dL (ref 6.5–8.1)

## 2020-09-07 LAB — CBC WITH DIFFERENTIAL (CANCER CENTER ONLY)
Abs Immature Granulocytes: 0.03 10*3/uL (ref 0.00–0.07)
Basophils Absolute: 0 10*3/uL (ref 0.0–0.1)
Basophils Relative: 0 %
Eosinophils Absolute: 0 10*3/uL (ref 0.0–0.5)
Eosinophils Relative: 0 %
HCT: 38.2 % — ABNORMAL LOW (ref 39.0–52.0)
Hemoglobin: 13.4 g/dL (ref 13.0–17.0)
Immature Granulocytes: 0 %
Lymphocytes Relative: 7 %
Lymphs Abs: 0.8 10*3/uL (ref 0.7–4.0)
MCH: 30.7 pg (ref 26.0–34.0)
MCHC: 35.1 g/dL (ref 30.0–36.0)
MCV: 87.6 fL (ref 80.0–100.0)
Monocytes Absolute: 0.2 10*3/uL (ref 0.1–1.0)
Monocytes Relative: 2 %
Neutro Abs: 10 10*3/uL — ABNORMAL HIGH (ref 1.7–7.7)
Neutrophils Relative %: 91 %
Platelet Count: 207 10*3/uL (ref 150–400)
RBC: 4.36 MIL/uL (ref 4.22–5.81)
RDW: 12.5 % (ref 11.5–15.5)
WBC Count: 11 10*3/uL — ABNORMAL HIGH (ref 4.0–10.5)
nRBC: 0 % (ref 0.0–0.2)

## 2020-09-07 IMAGING — CT CT ABD-PELV W/ CM
2 of 5 series · 16 of 46 positions shown, 18 images · IV contrast (OMNIPAQUE)
Comparison: [DATE]

CLINICAL DATA: Colon cancer diagnosed [DATE]. Chemotherapy complete.
Non-Hodgkin's lymphoma 11 years ago. Surveillance.

EXAM:
CT ABDOMEN AND PELVIS WITH CONTRAST
TECHNIQUE: Multidetector CT imaging of the abdomen and pelvis was performed
using the standard protocol following bolus administration of
intravenous contrast.
CONTRAST:  100mL OMNIPAQUE IOHEXOL 300 MG/ML  SOLN

[Series 2: axial st · axial · 0.87mm/px · z∈[-553,-143]mm · 13 of 96 slices shown, 15 images]
[im 7/96  soft-tissue]
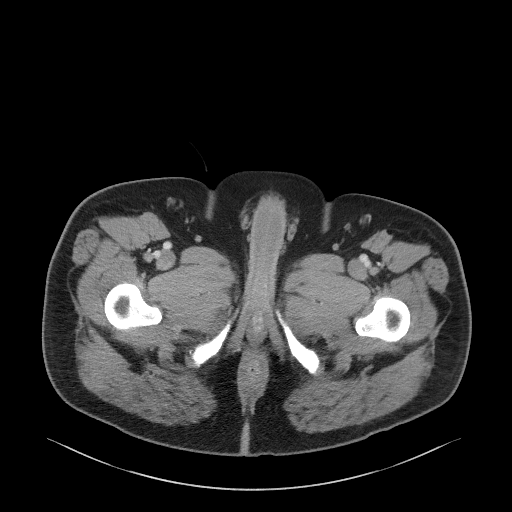
[im 7/96  bone]
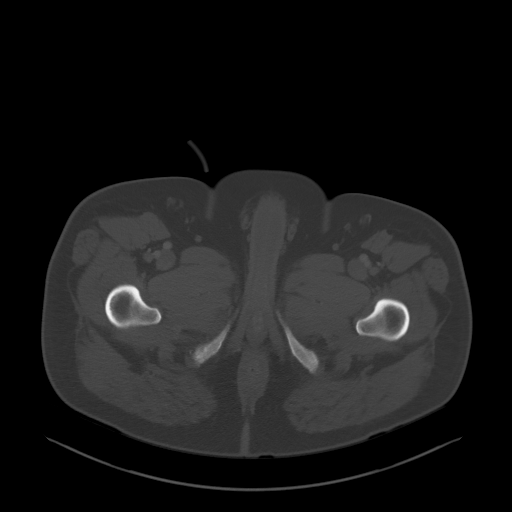
[im 14/96  soft-tissue]
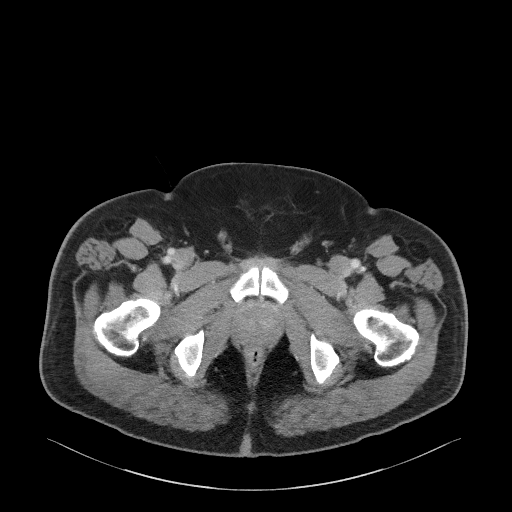
[im 21/96  soft-tissue]
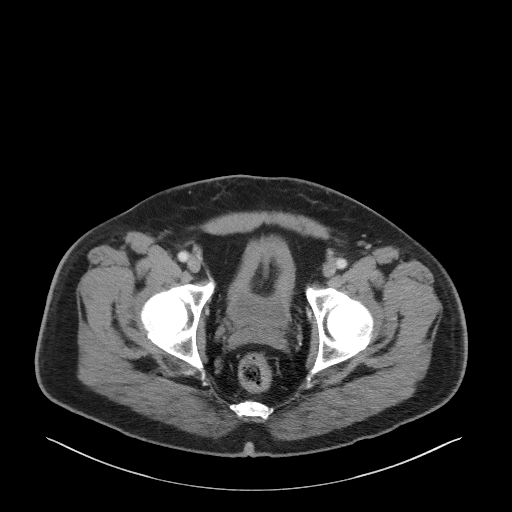
[im 28/96  soft-tissue]
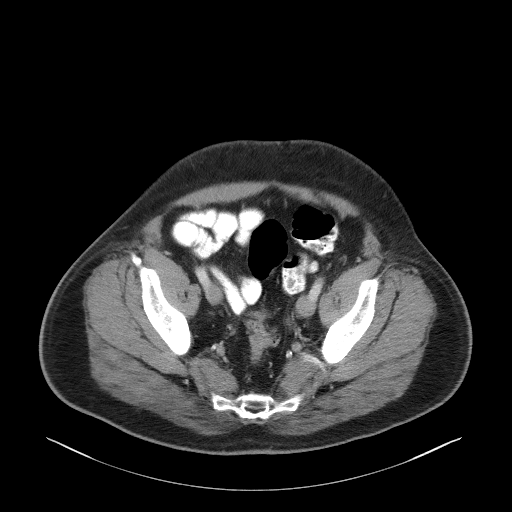
[im 34/96  soft-tissue]
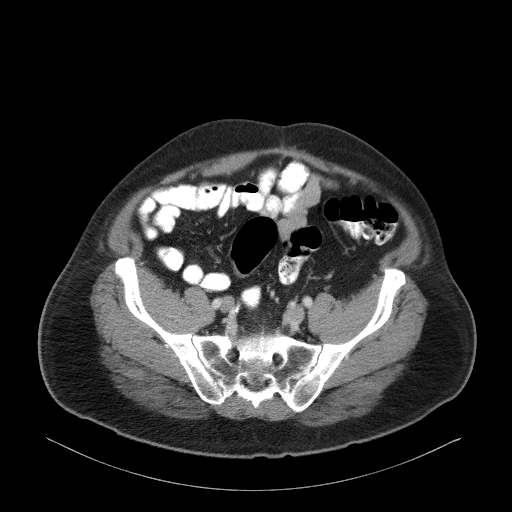
[im 41/96  soft-tissue]
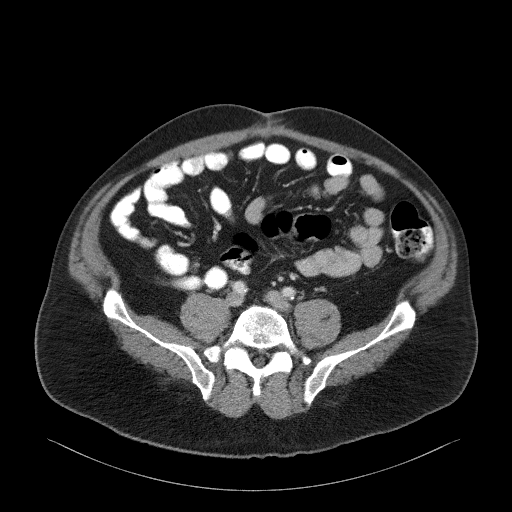
[im 48/96  soft-tissue]
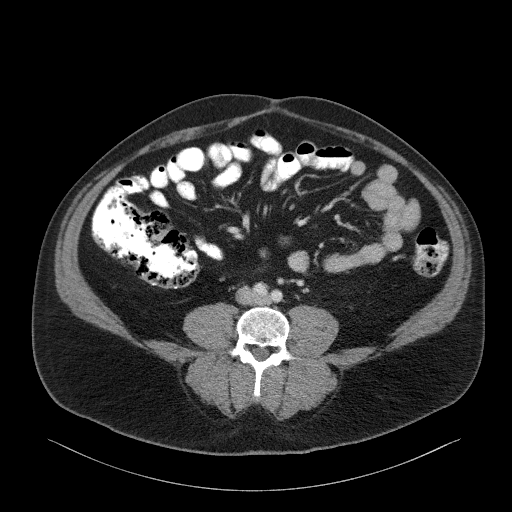
[im 55/96  soft-tissue]
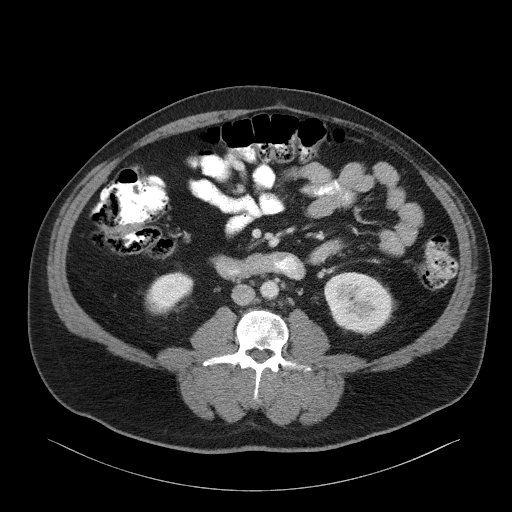
[im 62/96  soft-tissue]
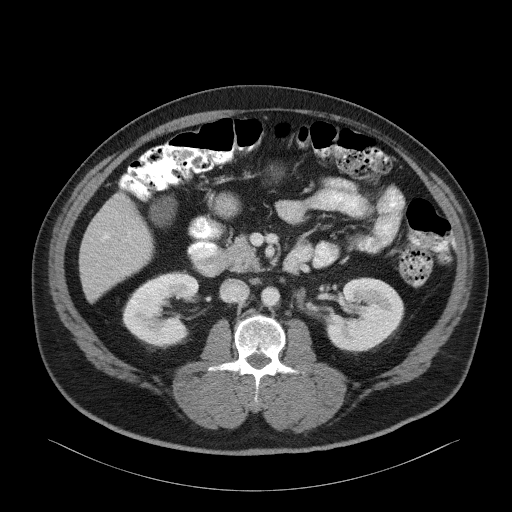
[im 62/96  bone]
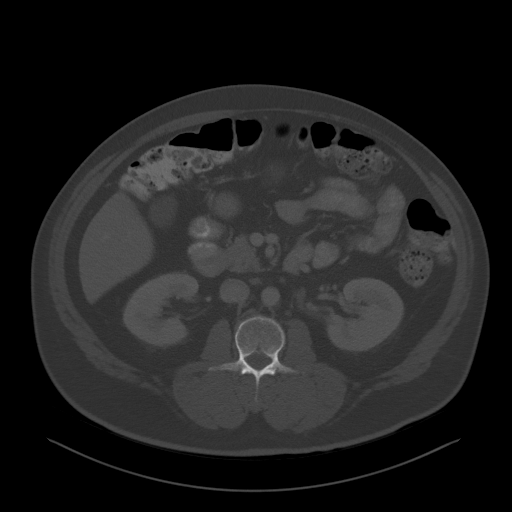
[im 68/96  soft-tissue]
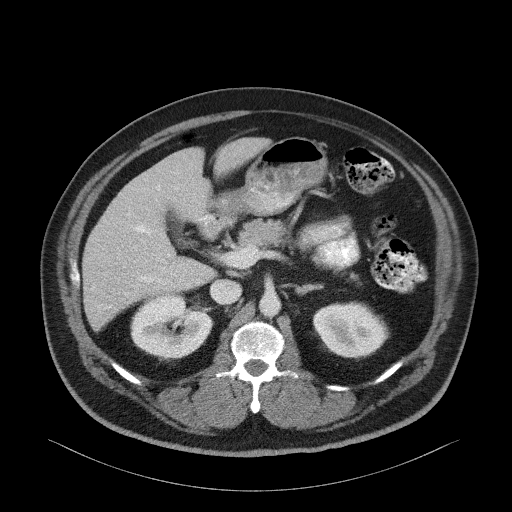
[im 75/96  soft-tissue]
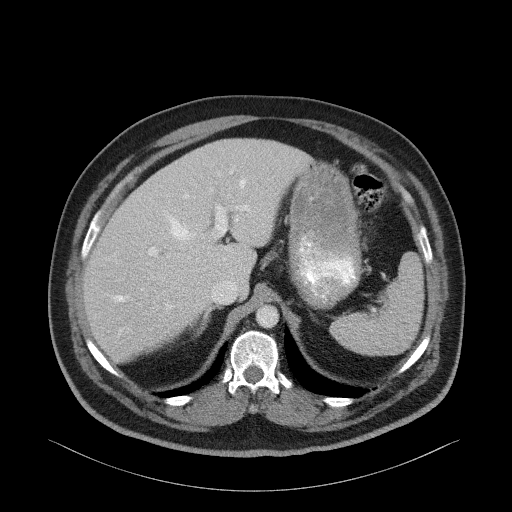
[im 82/96  soft-tissue]
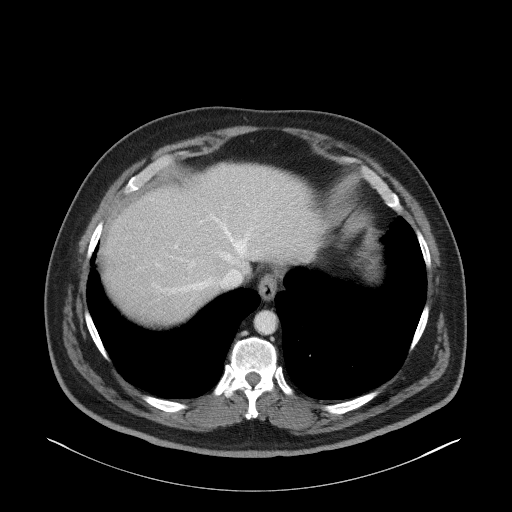
[im 89/96  soft-tissue]
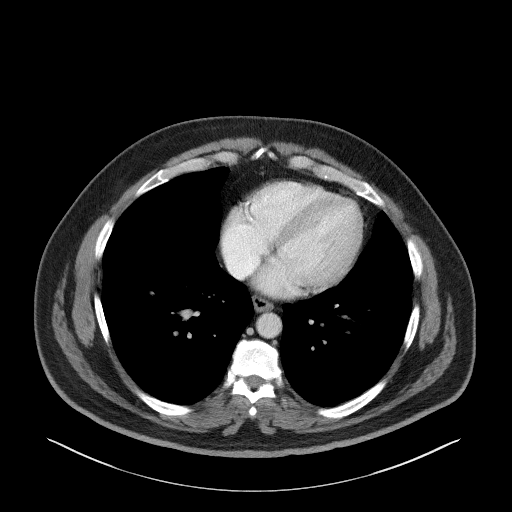

[Series 5: coronal st · coronal · 0.75mm/px · 3 of 101 slices shown]
[im 34/101  soft-tissue]
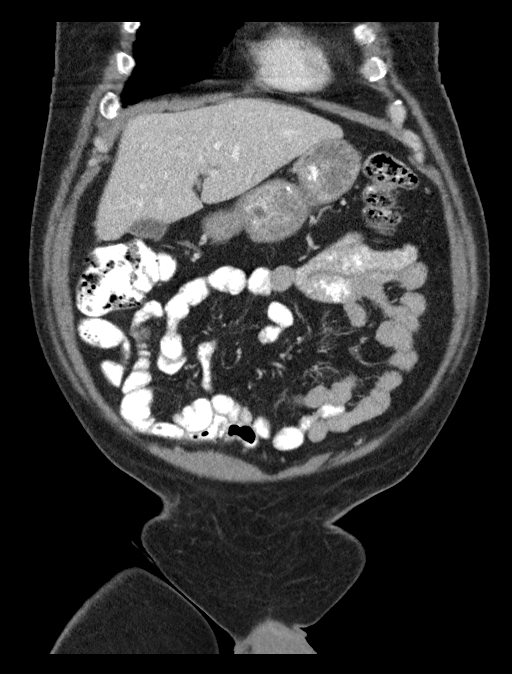
[im 45/101  soft-tissue]
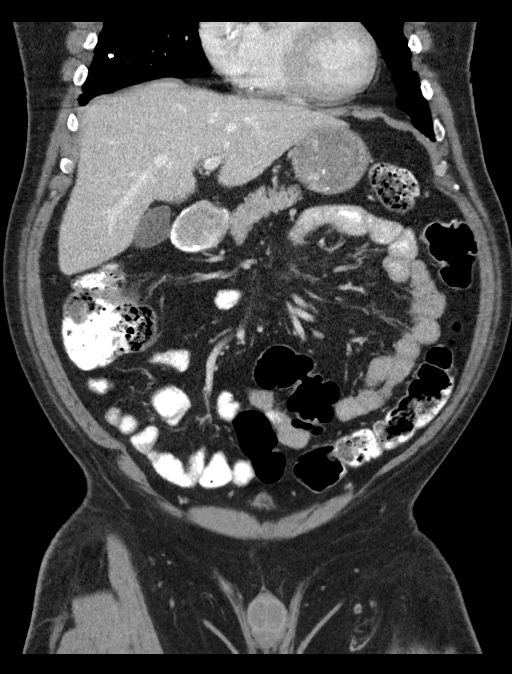
[im 56/101  soft-tissue]
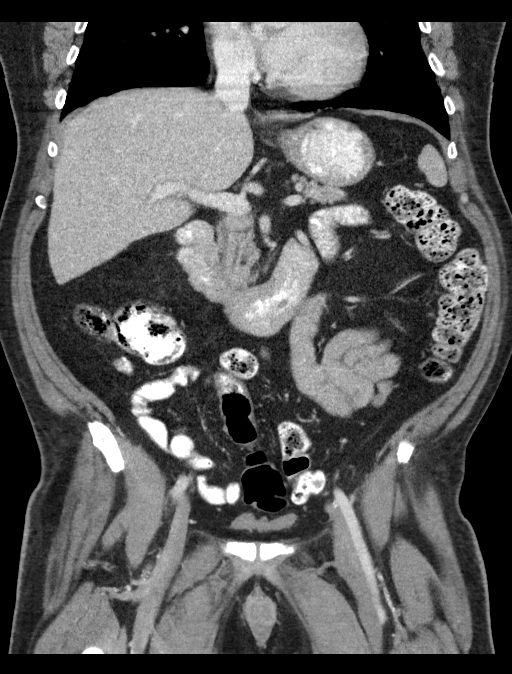

[16 of 46 positions shown; findings below may reference images not displayed]

FINDINGS: Lower chest: Bibasilar calcified granulomas. Incompletely imaged
central line in the right atrium. Normal heart size without
pericardial or pleural effusion. Left circumflex coronary artery
calcification.

Hepatobiliary: Normal liver. Normal gallbladder, without biliary
ductal dilatation.

Pancreas: Normal, without mass or ductal dilatation.

Spleen: Normal in size, without focal abnormality.

Adrenals/Urinary Tract: Normal adrenal glands. Exophytic upper pole
2.0 cm cyst. Normal right kidney, without hydronephrosis. Normal
urinary bladder.

Stomach/Bowel: Normal stomach, without wall thickening. Areas of
underdistention within the upper rectum and rectosigmoid junction.
Right hemicolectomy. Normal small bowel.

Vascular/Lymphatic: Aortic atherosclerosis. Retroaortic left renal
vein. No abdominopelvic adenopathy.

Reproductive: Mild prostatomegaly.

Other: No significant free fluid. No evidence of omental or
peritoneal disease. Mild ventral abdominal wall laxity containing
fat.

Musculoskeletal: Mild degenerate sclerosis of the bilateral
sacroiliac joints. Degenerate disc disease at the lumbosacral
junction.
IMPRESSION: 1. Status post right hemicolectomy, without recurrent or metastatic
disease.
2. Coronary artery atherosclerosis. Aortic Atherosclerosis
([GS]-[GS]).

## 2020-09-07 MED ORDER — SODIUM CHLORIDE 0.9% FLUSH
10.0000 mL | INTRAVENOUS | Status: DC | PRN
  Administered 2020-09-07: 10 mL
  Filled 2020-09-07: qty 10

## 2020-09-07 MED ORDER — IOHEXOL 300 MG/ML  SOLN
100.0000 mL | Freq: Once | INTRAMUSCULAR | Status: AC | PRN
  Administered 2020-09-07: 100 mL via INTRAVENOUS

## 2020-09-07 MED ORDER — HEPARIN SOD (PORK) LOCK FLUSH 100 UNIT/ML IV SOLN
500.0000 [IU] | Freq: Once | INTRAVENOUS | Status: AC | PRN
  Administered 2020-09-07: 500 [IU]
  Filled 2020-09-07: qty 5

## 2020-09-07 MED FILL — LIDOCAINE-PRILOCAINE CREAM: 2.5-2.5 | 14 days supply | Qty: 30 | Fill #1

## 2020-09-07 NOTE — Patient Instructions (Signed)

## 2020-09-10 LAB — IRON AND TIBC
Iron: 60 ug/dL (ref 42–163)
Saturation Ratios: 17 % — ABNORMAL LOW (ref 20–55)
TIBC: 360 ug/dL (ref 202–409)
UIBC: 300 ug/dL (ref 117–376)

## 2020-09-10 LAB — CEA (IN HOUSE-CHCC): CEA (CHCC-In House): 3.04 ng/mL (ref 0.00–5.00)

## 2020-09-10 LAB — FERRITIN: Ferritin: 134 ng/mL (ref 24–336)

## 2020-09-13 ENCOUNTER — Inpatient Hospital Stay: Payer: Medicaid Other | Admitting: Hematology

## 2020-09-13 ENCOUNTER — Other Ambulatory Visit: Payer: Medicaid Other

## 2020-10-17 ENCOUNTER — Other Ambulatory Visit: Payer: Self-pay

## 2020-10-17 MED ORDER — SITAGLIPTIN PHOSPHATE 50 MG PO TABS
50.0000 mg | ORAL_TABLET | Freq: Every day | ORAL | 3 refills | Status: DC
Start: 2020-10-17 — End: 2021-01-22

## 2020-11-23 ENCOUNTER — Other Ambulatory Visit: Payer: Self-pay

## 2020-11-25 NOTE — Progress Notes (Signed)
[place]   [phone] [phone]   Clinic Follow up Note   Patient Care Team: [name], DO as PCP - General (Family Medicine) [name], MD as Consulting Physician (Colon and Rectal Surgery) [name], MD as Consulting Physician (Hematology) 11/26/2020  CHIEF COMPLAINT: Follow up colon cancer   SUMMARY OF ONCOLOGIC HISTORY: Oncology History Overview Note  Cancer Staging Cancer of right colon [facility]) Staging form: Colon and Rectum, AJCC 8th Edition - Pathologic stage from 10/21/2018: Stage IVC (pT4b, pN1a, pM1c) - Signed by [name], MD on 65/04/3747  Follicular lymphoma ([place]), History of Staging form: Lymphoid Neoplasms, AJCC 6th Edition - Clinical: Stage II - Signed by Curt Bears, MD on 2/70/7867     Follicular lymphoma [facility]), History of  11/2009 Initial Diagnosis   Follicular lymphoma ([place]), History of    Chemotherapy   1) Status post 6 cycles of systemic chemotherapy with CHOP/Rituxan last dose given 05/01/2009.  2) Maintenance Rituxan at 375 mg per meter square given every 2 months status post 12 cycles    Cancer of right colon ([place])  10/21/2018 Surgery   Exploratory laparotomy right hemicolectomy by Dr. [name] and Dr. [name]  10/21/18   10/21/2018 Pathology Results   Diagnosis 10/21/18 Colon, segmental resection for tumor, right ascending and appendix - ADENOCARCINOMA, MODERATE TO POORLY DIFFERENTIATED (4 CM) - METASTATIC CARCINOMA INVOLVING ONE OF EIGHTEEN LYMPH NODES (1/18) - CARCINOMA EXTENDS INTO THE APPENDIX - TWO TUMOR DEPOSITS PRESENT - SEE ONCOLOGY TABLE AND COMMENT BELOW   10/21/2018 Cancer Staging   Staging form: Colon and Rectum, AJCC 8th Edition - Pathologic stage from 10/21/2018: Stage IVC (pT4b, pN1a, pM1c) - Signed by [name], MD on 11/13/2018   11/04/2018 Imaging   CT AP W Contrast 11/04/18  IMPRESSION: 1. Interval appendectomy. There is residual soft tissue thickening and  stranding in the right lower quadrant adjacent to the cecum which may represent ongoing inflammation versus postsurgical changes. A small soft tissue density adjacent to the surgical sutures may reflect small hematoma or operative collection. No large focal fluid collection to suggest drainable abscess allowing for absence of contrast 2. Fluid-filled colon without wall thickening, could reflect diarrheal process   11/08/2018 Imaging   CT Chest W Contrast 11/08/18  IMPRESSION: 1. No acute findings are noted in the thorax to account for the patient's symptoms. 2. Aortic atherosclerosis, in addition to left main and 3 vessel coronary artery disease. Please note that although the presence of coronary artery calcium documents the presence of coronary artery disease, the severity of this disease and any potential stenosis cannot be assessed on this non-gated CT examination. Assessment for potential risk factor modification, dietary therapy or pharmacologic therapy may be warranted, if clinically indicated. 3. Additional incidental findings, as above. Aortic Atherosclerosis (ICD10-I70.0).   11/12/2018 Initial Diagnosis   Cancer of right colon ([place])   11/25/2018 - 05/05/2019 Chemotherapy   FOLFOX q2weeks starting 11/25/18. Due to moderate thrombocytopenia, I will stop 5-FU bolus, and reduce pump infusion to 2234m/m2 starting with cycle 7.  Plan for last treatment on 05/05/19.    02/10/2019 Imaging   CT AP W Contrast  IMPRESSION: 1. No evidence metastatic disease. 2. Hepatic steatosis. 3. Mildly enlarged prostate. 4. Aortic atherosclerosis (ICD10-170.0). coronary artery calcification.   09/02/2019 Imaging   CT AP W Contrast IMPRESSION: 1. No signs to suggest metastatic disease in the abdomen or pelvis. 2. Aortic atherosclerosis these, as well as right coronary artery disease.   03/01/2020 Imaging  CT CAP W Contrast  IMPRESSION: 1. Stable exam. No new or progressive findings to  suggest recurrent or metastatic disease. 2. Status post right hemicolectomy. 3. Aortic Atherosclerosis (ICD10-I70.0).   09/07/2020 Imaging   CT AP  IMPRESSION: 1. Status post right hemicolectomy, without recurrent or metastatic disease. 2. Coronary artery atherosclerosis. Aortic Atherosclerosis (ICD10-I70.0).     CURRENT THERAPY: Surveillance   INTERVAL HISTORY: Mr. Geller returns for follow up. He was last seen by Dr. Burr Medico in 06/2020. Surveillance CT in 09/2020 was negative and labs were stable but he did not have a follow up visit.  All of a sudden over the last 3 weeks he developed sensation where his stomach "gets hot" periodically.  He had 1 episode of diarrhea otherwise no change in bowel habits.  He is eating less intentionally but appetite is the same.  He has early satiety, no overt pain or bloating.  The hot feeling is in the upper abdomen, not chest.  He does not cough or burp excessively after meals.  Sensation is worse at night.  He has tried Pepto-Bismol without much relief.  Denies heavy alcohol or spicy food intake.  Denies blood in stool, fever, chills, cough, chest pain, dyspnea or other concerns.   MEDICAL HISTORY:  Past Medical History:  Diagnosis Date  . Appendicitis 10/21/2018  . Diabetes mellitus   . Follicular lymphoma ([place]), 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   Qualifier: Diagnosis of  By: [name], [name]   . GI bleed 11/05/2018  . Hypercholesterolemia   . Hypertension 11/27/2011  . Lymphoma [facility])    gets annual chemo last tx Feb 2013  . TOBACCO USE, QUIT 08/18/2009   Qualifier: Diagnosis of  By: Drue Flirt  MD, Merrily Brittle      SURGICAL HISTORY: Past Surgical History:  Procedure Laterality Date  . ESOPHAGOGASTRODUODENOSCOPY (EGD) WITH PROPOFOL N/A 11/05/2018   Procedure: ESOPHAGOGASTRODUODENOSCOPY (EGD) WITH PROPOFOL;  Surgeon: [name], MD;  Location: [place];  Service: Endoscopy;  Laterality: N/A;  . IR IMAGING GUIDED PORT INSERTION  11/24/2018  .  LAPAROSCOPIC APPENDECTOMY N/A 10/21/2018   Procedure: Exploratory laparotomy right hemicolectomy;  Surgeon: [name], MD;  Location: [place];  Service: General;  Laterality: N/A;    I have reviewed the social history and family history with the patient and they are unchanged from previous note.  ALLERGIES:  is allergic to iohexol.  MEDICATIONS:  Current Outpatient Medications  Medication Sig Dispense Refill  . Accu-Chek Softclix Lancets lancets Use as instructed 100 each 12  . atorvastatin (LIPITOR) 40 MG tablet Take 1 tablet (40 mg total) by mouth daily. 90 tablet 3  . Blood Glucose Monitoring Suppl (ACCU-CHEK AVIVA PLUS) w/Device KIT Inject 1 kit into the skin 2 (two) times daily. 1 kit 0  . diphenhydrAMINE (BENADRYL) 50 MG tablet Take 1 tablet (50 mg total) by mouth as directed. Take 1 tablet 1 hour prior to Ct Scan 1 tablet 0  . gabapentin (NEURONTIN) 100 MG capsule Take by mouth 3 (three) times daily.    Marland Kitchen gabapentin (NEURONTIN) 300 MG capsule Take 1 capsule (300 mg total) by mouth 3 (three) times daily. 90 capsule 3  . glipiZIDE (GLUCOTROL XL) 10 MG 24 hr tablet TAKE 1 TABLET BY MOUTH ONCE DAILY BREAKFAST. 90 tablet 0  . glucose blood (ACCU-CHEK AVIVA PLUS) test strip Use as instructed 100 each 12  . hydrOXYzine (ATARAX/VISTARIL) 25 MG tablet Take 1 tablet (25 mg total) by mouth every 6 (six) hours. 12 tablet 0  .  lidocaine-prilocaine (EMLA) cream Apply 1 application topically as needed. 30 g 1  . metFORMIN (GLUCOPHAGE) 1000 MG tablet Take 1 tablet (1,000 mg total) by mouth 2 (two) times daily with a meal. 180 tablet 3  . pantoprazole (PROTONIX) 20 MG tablet Take 1 tablet (20 mg total) by mouth daily. 30 tablet 3  . predniSONE (DELTASONE) 50 MG tablet Take 1 tablet 13 hours before ct scan, 1 tablet 7 hours before ct scan, and 1 tablet 1 hour before ct scan 3 tablet 0  . sertraline (ZOLOFT) 50 MG tablet Take 1 tablet (50 mg total) by mouth daily. 30 tablet 0  . sitaGLIPtin  (JANUVIA) 50 MG tablet Take 1 tablet (50 mg total) by mouth daily. 90 tablet 3   No current facility-administered medications for this visit.    PHYSICAL EXAMINATION: ECOG PERFORMANCE STATUS: 0 - Asymptomatic  Vitals:   11/26/20 1313  BP: 139/66  Pulse: 80  Resp: 18  Temp: (!) 96.3 F (35.7 C)  SpO2: 100%   Filed Weights   11/26/20 1313  Weight: 196 lb 12.8 oz (89.3 kg)    GENERAL:alert, no distress and comfortable SKIN: no rash  EYES: sclera clear LUNGS: clear with normal breathing effort HEART: regular rate & rhythm, no lower extremity edema ABDOMEN:abdomen soft, non-tender and normal bowel sounds NEURO: alert & oriented x 3 with fluent speech PAC without erythema   LABORATORY DATA:  I have reviewed the data as listed CBC Latest Ref Rng & Units 11/26/2020 09/07/2020 08/31/2020  WBC 4.0 - 10.5 K/uL 10.5 11.0(H) 8.7  Hemoglobin 13.0 - 17.0 g/dL 14.0 13.4 13.2  Hematocrit 39.0 - 52.0 % 40.9 38.2(L) 39.5  Platelets 150 - 400 K/uL 218 207 188     CMP Latest Ref Rng & Units 11/26/2020 09/07/2020 08/31/2020  Glucose 70 - 99 mg/dL 143(H) 248(H) 215(H)  BUN 6 - 20 mg/dL '19 19 17  ' Creatinine 0.61 - 1.24 mg/dL 0.91 1.03 1.04  Sodium 135 - 145 mmol/L 138 132(L) 135  Potassium 3.5 - 5.1 mmol/L 4.6 4.8 4.1  Chloride 98 - 111 mmol/L 106 103 102  CO2 22 - 32 mmol/L '23 22 24  ' Calcium 8.9 - 10.3 mg/dL 9.4 9.3 9.2  Total Protein 6.5 - 8.1 g/dL 6.5 6.9 -  Total Bilirubin 0.3 - 1.2 mg/dL 0.4 0.4 -  Alkaline Phos 38 - 126 U/L 74 76 -  AST 15 - 41 U/L 15 17 -  ALT 0 - 44 U/L 22 20 -      RADIOGRAPHIC STUDIES: I have personally reviewed the radiological images as listed and agreed with the findings in the report. No results found.   ASSESSMENT & PLAN: 59 year old male  1. GERD -onset 3 weeks ago he developed intermittent epigastric "hot" feeling, worse at night -exam is benign, CBC, iron, CEA and CMP all normal except BG 143 -this is likely reflux, will start 20 mg po once  daily protonix.  -I am referring him back to Dr. Cristina Gong to discuss EGD (was benign in 10/2018), he is also overdue for colonoscopy, not done since colon cancer resection in 2019  -surveillance CT 09/2020 was negative.  -I have low suspicion this is related to cancer recurrence, but given his high recurrence risk will follow closely.   2.RightCecal adenocarcinoma,pT4bN1aM1c,Stage IVC,with limited peritoneal metastasis,resected,Grade II-III, MMR normal -Diagnosed in 10/2018. S/p right hemicolectomy.He was found to have 2 smallperitoneal metastasis during the surgery, which were removed.S/p 12 cycles of adjuvant FOLFOX. -Now on surveillance. CT 09/2020  NED, next due in 09/2021 unless clinically indicated sooner -needs benadryl and prednisone pre-med before CT contrast   3. H/o follicular lymphoma -s/p R-CHOP in 2010 and maintenance Rituxan through 2012  4. DM, HL -per PCP    Disposition:  Mr. Ploch appears stable.  He is clinically doing well from a colon cancer standpoint.  Exam is benign, all labs today normal except hyperglycemia, surveillance CT 09/2020 NED.  He has developed what appears to be new onset gastroesophageal reflux disease.  I recommend trial of Protonix p.o. once daily.  I have very low suspicion for cancer recurrence.  I am referring him back to Dr. Cristina Gong to discuss EGD and colonoscopy.  We will hold on repeating CT for now.   If above work-up is negative and symptoms are managed on Protonix he will return for routine surveillance visit in 4 months.  estions were answered. The patient knows to call the clinic with any problems, questions or concerns. No barriers to learning were detected using remote Spanish interpreter service. Total encounter time was 30 minutes.      Alla Feeling, NP 11/26/20

## 2020-11-26 ENCOUNTER — Inpatient Hospital Stay (HOSPITAL_BASED_OUTPATIENT_CLINIC_OR_DEPARTMENT_OTHER): Payer: Medicaid Other | Admitting: Nurse Practitioner

## 2020-11-26 ENCOUNTER — Other Ambulatory Visit: Payer: Self-pay

## 2020-11-26 ENCOUNTER — Inpatient Hospital Stay: Payer: Medicaid Other | Attending: Hematology

## 2020-11-26 ENCOUNTER — Encounter: Payer: Self-pay | Admitting: Nurse Practitioner

## 2020-11-26 VITALS — BP 139/66 | HR 80 | Temp 96.3°F | Resp 18 | Ht 65.0 in | Wt 196.8 lb

## 2020-11-26 DIAGNOSIS — Z8572 Personal history of non-Hodgkin lymphomas: Secondary | ICD-10-CM | POA: Diagnosis not present

## 2020-11-26 DIAGNOSIS — C182 Malignant neoplasm of ascending colon: Secondary | ICD-10-CM

## 2020-11-26 DIAGNOSIS — D500 Iron deficiency anemia secondary to blood loss (chronic): Secondary | ICD-10-CM

## 2020-11-26 DIAGNOSIS — Z85038 Personal history of other malignant neoplasm of large intestine: Secondary | ICD-10-CM | POA: Insufficient documentation

## 2020-11-26 DIAGNOSIS — E7800 Pure hypercholesterolemia, unspecified: Secondary | ICD-10-CM | POA: Insufficient documentation

## 2020-11-26 DIAGNOSIS — I10 Essential (primary) hypertension: Secondary | ICD-10-CM | POA: Insufficient documentation

## 2020-11-26 DIAGNOSIS — Z7984 Long term (current) use of oral hypoglycemic drugs: Secondary | ICD-10-CM | POA: Insufficient documentation

## 2020-11-26 DIAGNOSIS — E119 Type 2 diabetes mellitus without complications: Secondary | ICD-10-CM | POA: Diagnosis not present

## 2020-11-26 DIAGNOSIS — K219 Gastro-esophageal reflux disease without esophagitis: Secondary | ICD-10-CM | POA: Diagnosis not present

## 2020-11-26 DIAGNOSIS — Z79899 Other long term (current) drug therapy: Secondary | ICD-10-CM | POA: Diagnosis not present

## 2020-11-26 LAB — CMP (CANCER CENTER ONLY)
ALT: 22 U/L (ref 0–44)
AST: 15 U/L (ref 15–41)
Albumin: 3.6 g/dL (ref 3.5–5.0)
Alkaline Phosphatase: 74 U/L (ref 38–126)
Anion gap: 9 (ref 5–15)
BUN: 19 mg/dL (ref 6–20)
CO2: 23 mmol/L (ref 22–32)
Calcium: 9.4 mg/dL (ref 8.9–10.3)
Chloride: 106 mmol/L (ref 98–111)
Creatinine: 0.91 mg/dL (ref 0.61–1.24)
GFR, Estimated: >60 mL/min (ref >60)
Glucose, Bld: 143 mg/dL — ABNORMAL HIGH (ref 70–99)
Potassium: 4.6 mmol/L (ref 3.5–5.1)
Sodium: 138 mmol/L (ref 135–145)
Total Bilirubin: 0.4 mg/dL (ref 0.3–1.2)
Total Protein: 6.5 g/dL (ref 6.5–8.1)

## 2020-11-26 LAB — CBC WITH DIFFERENTIAL (CANCER CENTER ONLY)
Abs Immature Granulocytes: 0.03 10*3/uL (ref 0.00–0.07)
Basophils Absolute: 0 10*3/uL (ref 0.0–0.1)
Basophils Relative: 0 %
Eosinophils Absolute: 0.4 10*3/uL (ref 0.0–0.5)
Eosinophils Relative: 3 %
HCT: 40.9 % (ref 39.0–52.0)
Hemoglobin: 14 g/dL (ref 13.0–17.0)
Immature Granulocytes: 0 %
Lymphocytes Relative: 22 %
Lymphs Abs: 2.3 10*3/uL (ref 0.7–4.0)
MCH: 30 pg (ref 26.0–34.0)
MCHC: 34.2 g/dL (ref 30.0–36.0)
MCV: 87.6 fL (ref 80.0–100.0)
Monocytes Absolute: 1 10*3/uL (ref 0.1–1.0)
Monocytes Relative: 9 %
Neutro Abs: 6.8 10*3/uL (ref 1.7–7.7)
Neutrophils Relative %: 66 %
Platelet Count: 218 10*3/uL (ref 150–400)
RBC: 4.67 MIL/uL (ref 4.22–5.81)
RDW: 12.4 % (ref 11.5–15.5)
WBC Count: 10.5 10*3/uL (ref 4.0–10.5)
nRBC: 0 % (ref 0.0–0.2)

## 2020-11-26 LAB — IRON AND TIBC
Iron: 61 ug/dL (ref 42–163)
Saturation Ratios: 27 % (ref 20–55)
TIBC: 225 ug/dL (ref 202–409)
UIBC: 165 ug/dL (ref 117–376)

## 2020-11-26 LAB — CEA (IN HOUSE-CHCC): CEA (CHCC-In House): 4.62 ng/mL (ref 0.00–5.00)

## 2020-11-26 LAB — FERRITIN: Ferritin: 96 ng/mL (ref 24–336)

## 2020-11-26 MED ORDER — PANTOPRAZOLE SODIUM 20 MG PO TBEC: 20.0000 mg | DELAYED_RELEASE_TABLET | Freq: Every day | ORAL | 3 refills | Status: DC

## 2020-11-26 MED FILL — PANTOPRAZOLE SOD DR 20 MG T: 20 | 30 days supply | Qty: 30 | Fill #0

## 2020-11-27 ENCOUNTER — Telehealth: Payer: Self-pay | Admitting: Nurse Practitioner

## 2020-11-27 NOTE — Telephone Encounter (Signed)
Scheduled appts per 12/21 los. Unable to leave voicemail. Mailed appt reminder and calendar.

## 2020-11-29 NOTE — Telephone Encounter (Signed)
Attempted to reach pt through interpreter [facility] [phone]. No answer. LVM for pt to call the office to make appt for follow up and future med refills. Salvatore Marvel, CMA

## 2020-12-06 NOTE — Telephone Encounter (Signed)
2ND attempt to reach pt through interpreter Melanie#[phone]. No answer. LVM for pt to call the office for appt that's needed before any future med refills can be made. Aquilla Solian, CMA

## 2020-12-27 ENCOUNTER — Other Ambulatory Visit (HOSPITAL_COMMUNITY): Payer: Self-pay | Admitting: Physician Assistant

## 2020-12-27 DIAGNOSIS — K219 Gastro-esophageal reflux disease without esophagitis: Secondary | ICD-10-CM | POA: Diagnosis not present

## 2020-12-27 DIAGNOSIS — Z85038 Personal history of other malignant neoplasm of large intestine: Secondary | ICD-10-CM | POA: Diagnosis not present

## 2020-12-27 MED FILL — PANTOPRAZOLE SOD DR 40 MG T: 40 | 90 days supply | Qty: 90 | Fill #0

## 2021-01-11 ENCOUNTER — Other Ambulatory Visit: Payer: Self-pay

## 2021-01-11 ENCOUNTER — Inpatient Hospital Stay: Payer: Medicaid Other | Attending: Hematology | Admitting: Medical

## 2021-01-11 ENCOUNTER — Other Ambulatory Visit: Payer: Self-pay | Admitting: Medical

## 2021-01-11 ENCOUNTER — Telehealth: Payer: Self-pay | Admitting: Medical

## 2021-01-11 VITALS — BP 142/76 | HR 75 | Temp 98.6°F | Resp 18 | Wt 199.0 lb

## 2021-01-11 DIAGNOSIS — C182 Malignant neoplasm of ascending colon: Secondary | ICD-10-CM

## 2021-01-11 MED ORDER — METHOCARBAMOL 500 MG PO TABS: 500.0000 mg | ORAL_TABLET | Freq: Four times a day (QID) | ORAL | 0 refills | Status: DC

## 2021-01-11 MED FILL — METHOCARBAMOL 500 MG TABS: 500 | 10 days supply | Qty: 40 | Fill #0

## 2021-01-11 NOTE — Telephone Encounter (Signed)
Scheduled walk-in appointment per 2/4 schedule message.

## 2021-01-11 NOTE — Progress Notes (Signed)
This patient was seen as a walk-in today.  He reported that he had a rash on his hands.  He initially reported that he had bleeding of his hands.  He also reports having pain in his upper back.  On examination it was determined that the rash on his hands was consistent with an exposure to paint or a stain.  The area was scrubbed with an alcohol prep pad with the "rash" being removed.  On exam the patient was noted to have muscle spasms in his upper back and was given a prescription for Robaxin.  After talking with the patient further he admitted that he had been tenths as he was worried about the rash on his hands.  Sandi Mealy, MHS, [name] Physician Assistant

## 2021-01-18 ENCOUNTER — Other Ambulatory Visit: Payer: Self-pay

## 2021-01-22 ENCOUNTER — Ambulatory Visit: Payer: Medicaid Other | Admitting: Family Medicine

## 2021-01-22 ENCOUNTER — Ambulatory Visit (INDEPENDENT_AMBULATORY_CARE_PROVIDER_SITE_OTHER): Payer: Medicaid Other

## 2021-01-22 ENCOUNTER — Encounter: Payer: Self-pay | Admitting: Family Medicine

## 2021-01-22 ENCOUNTER — Other Ambulatory Visit: Payer: Self-pay

## 2021-01-22 VITALS — BP 153/91 | HR 88 | Ht 65.0 in | Wt 195.4 lb

## 2021-01-22 DIAGNOSIS — Z789 Other specified health status: Secondary | ICD-10-CM | POA: Diagnosis not present

## 2021-01-22 DIAGNOSIS — E7800 Pure hypercholesterolemia, unspecified: Secondary | ICD-10-CM | POA: Diagnosis not present

## 2021-01-22 DIAGNOSIS — E1165 Type 2 diabetes mellitus with hyperglycemia: Secondary | ICD-10-CM | POA: Diagnosis not present

## 2021-01-22 DIAGNOSIS — Z23 Encounter for immunization: Secondary | ICD-10-CM

## 2021-01-22 DIAGNOSIS — I10 Essential (primary) hypertension: Secondary | ICD-10-CM | POA: Diagnosis not present

## 2021-01-22 DIAGNOSIS — M25511 Pain in right shoulder: Secondary | ICD-10-CM | POA: Diagnosis not present

## 2021-01-22 DIAGNOSIS — M25512 Pain in left shoulder: Secondary | ICD-10-CM

## 2021-01-22 LAB — POCT GLYCOSYLATED HEMOGLOBIN (HGB A1C): Hemoglobin A1C: 8.3 % — AB (ref 4.0–5.6)

## 2021-01-22 MED ORDER — BACLOFEN 10 MG PO TABS: 5.0000 mg | ORAL_TABLET | Freq: Three times a day (TID) | ORAL | 0 refills | Status: DC

## 2021-01-22 MED ORDER — GLIPIZIDE ER 10 MG PO TB24: ORAL_TABLET | ORAL | 0 refills | Status: DC

## 2021-01-22 MED ORDER — SITAGLIPTIN PHOSPHATE 50 MG PO TABS: 50.0000 mg | ORAL_TABLET | Freq: Every day | ORAL | 3 refills | Status: DC

## 2021-01-22 MED ORDER — METFORMIN HCL 1000 MG PO TABS: 1000.0000 mg | ORAL_TABLET | Freq: Two times a day (BID) | ORAL | 3 refills | Status: DC

## 2021-01-22 NOTE — Progress Notes (Signed)
    SUBJECTIVE:   CHIEF COMPLAINT / HPI:   Spanish interpreter present during entire encounter.  Type 2 diabetes Previous medications: Metformin 1000 BID, glipizide 10mg , Sitagliptin 50mg  daily. Fasting blood sugars: No blood checks  Complications: Patient reports some recent blurry vision (chart review states neuropathy present) Statin: yes (Atorvastatin 40mg )  HTN: Patient reports that he is not taking any medication for his blood pressure.   B/l shoulder pain Patient states that he was recently seen by another physician a few weeks ago after he lifted heavy items and started having bilateral shoulder pain.  Reports that he was prescribed Robaxin and would like a refill.   PERTINENT  PMH / PSH: HTN, type 2 diabetes, neuropathy, HTN, HLD, obesity, right colon cancer  OBJECTIVE:   Pulse 88   Ht 5\' 5"  (1.651 m)   Wt 195 lb 6.4 oz (88.6 kg)   SpO2 99%   BMI 32.52 kg/m   Gen: well-appearing, NAD Pulm: comfortable on room air, no increased WOB GI: non-distended, obese MSK: limitation of shoulder ROM due to pain  ASSESSMENT/PLAN:   Type 2 diabetes HbA1c 8.3. Medications include Metformin, glipizide, Januvia.  Due to CV and renal protective benefits, recommended switching from glipizide (which has no extra benefit besides lowering A1c) to an SGLTi or GLP-1 as patient's ASCVD risk is 25.4%.  Patient refuses any medication changes.  Discussed at length the benefits of the other medications in regards to heart protection and patient prefer to stay on glipizide.  I am concerned with patient taking glipizide as he refuses to do blood sugar checks at home, glipizide has an increased risk of hypoglycemic episodes-discussed with patient at length the risks. - Refill metformin, januvia, glipizide - At next appointment, readdress provider preference to replace glipizide  - Ophthalmology referral for diabetic eye exam  B/l shoulder pain from acute injury Patient has persistent pain in  shoulders that limits ROM. Previously getting Robaxin, which is not ideal for patient's age range. Do not recommend further refills of medications without addressing possible need for physical therapy if persistent pain.  - Prescribing short course of baclofen.  Hyperlipidemia Patient last lipid panel was on 12/20/2019. Patient currently on Atorvastatin 40mg , which he states he is taking but did not know the name of the medication. Patient requested lipid panel be completed at next visit. - Obtain lipid panel at next visit  HTN BP today 153/91. Patient currently does not have any medications for HTN and was unwilling to start or change any of his current medications. Patient needs further discussion about blood pressure control at next visit.  Health maintenance - Patient received Covid booster - Flu shot today  [name], [place]

## 2021-01-22 NOTE — Patient Instructions (Addendum)
It was a remediated!  I respect the following:  Diabetes: I am sending in refills for your medications now. Your A1c was 8.3, your goal is <7. Please follow up in 3 months to check how your diabetes is doing, we will also check a lipid panel at that time to monitor your cholesterol.  I am sending you a prescription for baclofen instead of robaxin as this is a safer medication.

## 2021-01-25 DIAGNOSIS — Z01812 Encounter for preprocedural laboratory examination: Secondary | ICD-10-CM | POA: Diagnosis not present

## 2021-01-28 ENCOUNTER — Inpatient Hospital Stay: Payer: Medicaid Other

## 2021-03-19 ENCOUNTER — Other Ambulatory Visit (HOSPITAL_COMMUNITY): Payer: Self-pay

## 2021-03-27 NOTE — Progress Notes (Signed)
[facility]   [phone] [phone]   Clinic Follow up Note   Patient Care Team: Lilland, Alana, DO as PCP - General (Family Medicine) White, Christopher M, MD as Consulting Physician (Colon and Rectal Surgery) Feng, Yan, MD as Consulting Physician (Hematology)  Date of Service:  03/28/2021  CHIEF COMPLAINT: f/u of colon cancer  SUMMARY OF ONCOLOGIC HISTORY: Oncology History Overview Note  Cancer Staging Cancer of right colon (HCC) Staging form: Colon and Rectum, AJCC 8th Edition - Pathologic stage from 10/21/2018: Stage IVC (pT4b, pN1a, pM1c) - Signed by Feng, Yan, MD on 11/13/2018  Follicular lymphoma (HCC), History of Staging form: Lymphoid Neoplasms, AJCC 6th Edition - Clinical: Stage II - Signed by Mohamed, Mohamed, MD on 02/01/2014     Follicular lymphoma (HCC), History of  11/2009 Initial Diagnosis   Follicular lymphoma (HCC), History of    Chemotherapy   1) Status post 6 cycles of systemic chemotherapy with CHOP/Rituxan last dose given 05/01/2009.  2) Maintenance Rituxan at 375 mg per meter square given every 2 months status post 12 cycles    Cancer of right colon (HCC)  10/21/2018 Surgery   Exploratory laparotomy right hemicolectomy by Dr. White and Dr. Blackman  10/21/18   10/21/2018 Pathology Results   Diagnosis 10/21/18 Colon, segmental resection for tumor, right ascending and appendix - ADENOCARCINOMA, MODERATE TO POORLY DIFFERENTIATED (4 CM) - METASTATIC CARCINOMA INVOLVING ONE OF EIGHTEEN LYMPH NODES (1/18) - CARCINOMA EXTENDS INTO THE APPENDIX - TWO TUMOR DEPOSITS PRESENT - SEE ONCOLOGY TABLE AND COMMENT BELOW   10/21/2018 Cancer Staging   Staging form: Colon and Rectum, AJCC 8th Edition - Pathologic stage from 10/21/2018: Stage IVC (pT4b, pN1a, pM1c) - Signed by Feng, Yan, MD on 11/13/2018   11/04/2018 Imaging   CT AP W Contrast 11/04/18  IMPRESSION: 1. Interval appendectomy. There is residual soft tissue  thickening and stranding in the right lower quadrant adjacent to the cecum which may represent ongoing inflammation versus postsurgical changes. A small soft tissue density adjacent to the surgical sutures may reflect small hematoma or operative collection. No large focal fluid collection to suggest drainable abscess allowing for absence of contrast 2. Fluid-filled colon without wall thickening, could reflect diarrheal process   11/08/2018 Imaging   CT Chest W Contrast 11/08/18  IMPRESSION: 1. No acute findings are noted in the thorax to account for the patient's symptoms. 2. Aortic atherosclerosis, in addition to left main and 3 vessel coronary artery disease. Please note that although the presence of coronary artery calcium documents the presence of coronary artery disease, the severity of this disease and any potential stenosis cannot be assessed on this non-gated CT examination. Assessment for potential risk factor modification, dietary therapy or pharmacologic therapy may be warranted, if clinically indicated. 3. Additional incidental findings, as above. Aortic Atherosclerosis (ICD10-I70.0).   11/12/2018 Initial Diagnosis   Cancer of right colon (HCC)   11/25/2018 - 05/05/2019 Chemotherapy   FOLFOX q2weeks starting 11/25/18. Due to moderate thrombocytopenia, I will stop 5-FU bolus, and reduce pump infusion to 2200mg/m2 starting with cycle 7.  Plan for last treatment on 05/05/19.    02/10/2019 Imaging   CT AP W Contrast  IMPRESSION: 1. No evidence metastatic disease. 2. Hepatic steatosis. 3. Mildly enlarged prostate. 4. Aortic atherosclerosis (ICD10-170.0). coronary artery calcification.   09/02/2019 Imaging   CT AP W Contrast IMPRESSION: 1. No signs to suggest metastatic disease in the abdomen or pelvis. 2. Aortic atherosclerosis these, as well as right coronary artery disease.     03/01/2020 Imaging   CT CAP W Contrast  IMPRESSION: 1. Stable exam. No new or progressive  findings to suggest recurrent or metastatic disease. 2. Status post right hemicolectomy. 3. Aortic Atherosclerosis (ICD10-I70.0).   09/07/2020 Imaging   CT AP  IMPRESSION: 1. Status post right hemicolectomy, without recurrent or metastatic disease. 2. Coronary artery atherosclerosis. Aortic Atherosclerosis (ICD10-I70.0).      CURRENT THERAPY:  Surveillance  INTERVAL HISTORY:  Blake Vaughn is here for a follow up of colon cancer. He was last seen by me on 06/13/20 and by NP Lacie on 11/26/20 in the interim. She presents to the clinic alone. Our pharmacist student Pam acted as a translator today. He notes he is scheduled for colonoscopy and endoscopy on 04/28/21 at [facility]. He notes some cramping/shooting pain in the lower abdomen; it happens "every so often" and started about a week ago. He notes this was a symptom leading to his first cancer. He denies any bowel changes, no blood.  REVIEW OF SYSTEMS:   Constitutional: Denies fevers, chills or abnormal weight loss Eyes: Denies blurriness of vision Ears, nose, mouth, throat, and face: Denies mucositis or sore throat Respiratory: Denies cough, dyspnea or wheezes Cardiovascular: Denies palpitation, chest discomfort or lower extremity swelling Gastrointestinal:  Denies nausea, heartburn or change in bowel habits, (+) intermittent cramping Skin: Denies abnormal skin rashes Lymphatics: Denies new lymphadenopathy or easy bruising Neurological:Denies numbness, tingling or new weaknesses Behavioral/Psych: Mood is stable, no new changes  All other systems were reviewed with the patient and are negative.  MEDICAL HISTORY:  Past Medical History:  Diagnosis Date  . Appendicitis 10/21/2018  . Diabetes mellitus   . Follicular lymphoma (HCC), 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   Qualifier: Diagnosis of  By: Kaplan MD, Robert D   . GI bleed 11/05/2018  . Hypercholesterolemia   . Hypertension 11/27/2011  . Lymphoma (HCC)    gets annual chemo last tx Feb  2013  . TOBACCO USE, QUIT 08/18/2009   Qualifier: Diagnosis of  By: Bolden  MD, Taineisha      SURGICAL HISTORY: Past Surgical History:  Procedure Laterality Date  . ESOPHAGOGASTRODUODENOSCOPY (EGD) WITH PROPOFOL N/A 11/05/2018   Procedure: ESOPHAGOGASTRODUODENOSCOPY (EGD) WITH PROPOFOL;  Surgeon: Buccini, Robert, MD;  Location: [facility] ENDOSCOPY;  Service: Endoscopy;  Laterality: N/A;  . IR IMAGING GUIDED PORT INSERTION  11/24/2018  . LAPAROSCOPIC APPENDECTOMY N/A 10/21/2018   Procedure: Exploratory laparotomy right hemicolectomy;  Surgeon: White, Christopher M, MD;  Location: [facility] OR;  Service: General;  Laterality: N/A;    I have reviewed the social history and family history with the patient and they are unchanged from previous note.  ALLERGIES:  is allergic to iohexol.  MEDICATIONS:  Current Outpatient Medications  Medication Sig Dispense Refill  . Accu-Chek Softclix Lancets lancets Use as instructed 100 each 12  . atorvastatin (LIPITOR) 40 MG tablet Take 1 tablet (40 mg total) by mouth daily. 90 tablet 3  . baclofen (LIORESAL) 10 MG tablet Take 0.5 tablets (5 mg total) by mouth 3 (three) times daily. 30 tablet 0  . Blood Glucose Monitoring Suppl (ACCU-CHEK AVIVA PLUS) w/Device KIT Inject 1 kit into the skin 2 (two) times daily. 1 kit 0  . diphenhydrAMINE (BENADRYL) 50 MG tablet Take 1 tablet (50 mg total) by mouth as directed. Take 1 tablet 1 hour prior to Ct Scan (Patient not taking: Reported on 01/22/2021) 1 tablet 0  . glipiZIDE (GLUCOTROL XL) 10 MG 24 hr tablet TAKE 1 TABLET BY MOUTH ONCE DAILY BREAKFAST.   90 tablet 0  . glucose blood (ACCU-CHEK AVIVA PLUS) test strip Use as instructed 100 each 12  . hydrOXYzine (ATARAX/VISTARIL) 25 MG tablet Take 1 tablet (25 mg total) by mouth every 6 (six) hours. (Patient not taking: Reported on 01/22/2021) 12 tablet 0  . lidocaine-prilocaine (EMLA) cream Apply 1 application topically as needed. 30 g 1  . metFORMIN (GLUCOPHAGE) 1000 MG tablet Take 1  tablet (1,000 mg total) by mouth 2 (two) times daily with a meal. 180 tablet 3  . predniSONE (DELTASONE) 50 MG tablet Take 1 tablet 13 hours before ct scan, 1 tablet 7 hours before ct scan, and 1 tablet 1 hour before ct scan (Patient not taking: Reported on 01/22/2021) 3 tablet 0  . sitaGLIPtin (JANUVIA) 50 MG tablet Take 1 tablet (50 mg total) by mouth daily. 90 tablet 3   No current facility-administered medications for this visit.    PHYSICAL EXAMINATION: ECOG PERFORMANCE STATUS: 1 - Symptomatic but completely ambulatory  Vitals:   03/28/21 1105  BP: (!) 148/90  Pulse: 82  Resp: 17  Temp: (!) 97.1 F (36.2 C)  SpO2: 100%   Filed Weights   03/28/21 1105  Weight: 204 lb 4.8 oz (92.7 kg)    GENERAL:alert, no distress and comfortable SKIN: skin color, texture, turgor are normal, no rashes or significant lesions EYES: normal, Conjunctiva are pink and non-injected, sclera clear  NECK: supple, thyroid normal size, non-tender, without nodularity LYMPH:  no palpable lymphadenopathy in the cervical, axillary  LUNGS: clear to auscultation and percussion with normal breathing effort HEART: regular rate & rhythm and no murmurs and no lower extremity edema ABDOMEN:abdomen soft, and normal bowel sounds; some pain to right abdomen Musculoskeletal:no cyanosis of digits and no clubbing  NEURO: alert & oriented x 3 with fluent speech, no focal motor/sensory deficits  LABORATORY DATA:  I have reviewed the data as listed CBC Latest Ref Rng & Units 03/28/2021 11/26/2020 09/07/2020  WBC 4.0 - 10.5 K/uL 9.9 10.5 11.0(H)  Hemoglobin 13.0 - 17.0 g/dL 13.8 14.0 13.4  Hematocrit 39.0 - 52.0 % 40.4 40.9 38.2(L)  Platelets 150 - 400 K/uL 199 218 207     CMP Latest Ref Rng & Units 03/28/2021 11/26/2020 09/07/2020  Glucose 70 - 99 mg/dL 407(H) 143(H) 248(H)  BUN 6 - 20 mg/dL 24(H) 19 19  Creatinine 0.61 - 1.24 mg/dL 1.23 0.91 1.03  Sodium 135 - 145 mmol/L 133(L) 138 132(L)  Potassium 3.5 - 5.1 mmol/L  4.7 4.6 4.8  Chloride 98 - 111 mmol/L 99 106 103  CO2 22 - 32 mmol/L _0 Calcium 8.9 - 10.3 mg/dL 9.5 9.4 9.3  Total Protein 6.5 - 8.1 g/dL 6.4(L) 6.5 6.9  Total Bilirubin 0.3 - 1.2 mg/dL 0.4 0.4 0.4  Alkaline Phos 38 - 126 U/L 87 74 76  AST 15 - 41 U/L _1 ALT 0 - 44 U/L _2 RADIOGRAPHIC STUDIES: I have personally reviewed the radiological images as listed and agreed with the findings in the report. No results found.   ASSESSMENT & PLAN:  Blake Vaughn is a 60 y.o. male with   1.RightCecal adenocarcinoma,pT4bN1aM1c,Stage IVC,with limited peritoneal metastasis,resected,Grade II-III, MMR normal -Diagnosed in 10/2018. Treated with right hemicolectomy.He was found to have 2 smallperitoneal metastasis during the surgery, which were removed. -He completed 12 cycles of adjuvant FOLFOX.Now on surveillance. -CT A/P on 09/07/20 was negative -He is scheduled for endo/colonoscopy on 04/28/21 under Dr. Cristina Gong  at [facility]. -He is having some abdominal cramping. I recommended he try a laxative to see if that relieves the pain. -He is now 2.5 years out from his cancer diagnosis. He can have his port removed now, if he wants. He would like to wait until after his colonoscopy. -He will undergo one more routine scan in 6 months and then just follow up every 6-12 month for a total of 5 years.   2. H/o Follicular Lymphoma -Was treated with CHOP/Rituxan in 2010.Then he waspreviouslytreated withMaintenance Rituxancompleted in 2012.Last f/u was in 2017.  3. DM and Hypercholesterolemia, uncontrolled hyperglycemia -On metformin,Glimepiride,Lipitor and metoprolol.  -His CT AP from 09/02/19 also shows aortic atherosclerosis, which can increase his risk for MI. -He does use Prednisone before CT scans. He is also to hold metformin 2 days before scan. I advised him to closely watch his BG at home.  -His BG is 407 today. He did not take his medicine this  morning. I recommended he f/u with his PCP  5. Neuropathy, G1  -Secondary toprior chemo Oxaliplatin -He still has very mild residual neuropathy.  -Will continue Gabapentin    Plan -Proceed with endo/colonoscopy on 04/28/21 at [facility]  -f/u in 6 weeks, if he has persistent abdominal pain, will order CT scan on next visit    No problem-specific Assessment & Plan notes found for this encounter.   Orders Placed This Encounter  Procedures  . CT CHEST ABDOMEN PELVIS W CONTRAST    Standing Status:   Future    Standing Expiration Date:   03/28/2022    Order Specific Question:   If indicated for the ordered procedure, I authorize the administration of contrast media per Radiology protocol    Answer:   Yes    Order Specific Question:   Preferred imaging location?    Answer:   [facility]    Order Specific Question:   Release to patient    Answer:   Immediate    Order Specific Question:   Is Oral Contrast requested for this exam?    Answer:   Yes, Per Radiology protocol    Order Specific Question:   Reason for Exam (SYMPTOM  OR DIAGNOSIS REQUIRED)    Answer:   rule out cancer recurrence   All questions were answered. The patient knows to call the clinic with any problems, questions or concerns. No barriers to learning was detected. The total time spent in the appointment was 30 minutes.     Yan Feng, MD 03/28/2021   I, Katie Daubenspeck, am acting as scribe for Yan Feng, MD.   I have reviewed the above documentation for accuracy and completeness, and I agree with the above.     

## 2021-03-28 ENCOUNTER — Inpatient Hospital Stay: Payer: Medicaid Other | Attending: Hematology

## 2021-03-28 ENCOUNTER — Encounter: Payer: Self-pay | Admitting: Hematology

## 2021-03-28 ENCOUNTER — Inpatient Hospital Stay: Payer: Medicaid Other

## 2021-03-28 ENCOUNTER — Other Ambulatory Visit: Payer: Self-pay

## 2021-03-28 ENCOUNTER — Inpatient Hospital Stay (HOSPITAL_BASED_OUTPATIENT_CLINIC_OR_DEPARTMENT_OTHER): Payer: Medicaid Other | Admitting: Hematology

## 2021-03-28 VITALS — BP 148/90 | HR 82 | Temp 97.1°F | Resp 17 | Ht 65.0 in | Wt 204.3 lb

## 2021-03-28 DIAGNOSIS — E1165 Type 2 diabetes mellitus with hyperglycemia: Secondary | ICD-10-CM | POA: Insufficient documentation

## 2021-03-28 DIAGNOSIS — E1142 Type 2 diabetes mellitus with diabetic polyneuropathy: Secondary | ICD-10-CM | POA: Insufficient documentation

## 2021-03-28 DIAGNOSIS — C182 Malignant neoplasm of ascending colon: Secondary | ICD-10-CM | POA: Insufficient documentation

## 2021-03-28 DIAGNOSIS — G620 Drug-induced polyneuropathy: Secondary | ICD-10-CM | POA: Insufficient documentation

## 2021-03-28 DIAGNOSIS — Z8572 Personal history of non-Hodgkin lymphomas: Secondary | ICD-10-CM | POA: Insufficient documentation

## 2021-03-28 DIAGNOSIS — Z7984 Long term (current) use of oral hypoglycemic drugs: Secondary | ICD-10-CM | POA: Insufficient documentation

## 2021-03-28 DIAGNOSIS — Z79899 Other long term (current) drug therapy: Secondary | ICD-10-CM | POA: Diagnosis not present

## 2021-03-28 DIAGNOSIS — Z95828 Presence of other vascular implants and grafts: Secondary | ICD-10-CM

## 2021-03-28 DIAGNOSIS — C786 Secondary malignant neoplasm of retroperitoneum and peritoneum: Secondary | ICD-10-CM | POA: Insufficient documentation

## 2021-03-28 DIAGNOSIS — D500 Iron deficiency anemia secondary to blood loss (chronic): Secondary | ICD-10-CM

## 2021-03-28 DIAGNOSIS — C8200 Follicular lymphoma grade I, unspecified site: Secondary | ICD-10-CM | POA: Diagnosis not present

## 2021-03-28 LAB — CMP (CANCER CENTER ONLY)
ALT: 17 U/L (ref 0–44)
AST: 17 U/L (ref 15–41)
Albumin: 3.5 g/dL (ref 3.5–5.0)
Alkaline Phosphatase: 87 U/L (ref 38–126)
Anion gap: 10 (ref 5–15)
BUN: 24 mg/dL — ABNORMAL HIGH (ref 6–20)
CO2: 24 mmol/L (ref 22–32)
Calcium: 9.5 mg/dL (ref 8.9–10.3)
Chloride: 99 mmol/L (ref 98–111)
Creatinine: 1.23 mg/dL (ref 0.61–1.24)
GFR, Estimated: >60 mL/min (ref >60)
Glucose, Bld: 407 mg/dL — ABNORMAL HIGH (ref 70–99)
Potassium: 4.7 mmol/L (ref 3.5–5.1)
Sodium: 133 mmol/L — ABNORMAL LOW (ref 135–145)
Total Bilirubin: 0.4 mg/dL (ref 0.3–1.2)
Total Protein: 6.4 g/dL — ABNORMAL LOW (ref 6.5–8.1)

## 2021-03-28 LAB — CBC WITH DIFFERENTIAL (CANCER CENTER ONLY)
Abs Immature Granulocytes: 0.03 10*3/uL (ref 0.00–0.07)
Basophils Absolute: 0 10*3/uL (ref 0.0–0.1)
Basophils Relative: 0 %
Eosinophils Absolute: 0.4 10*3/uL (ref 0.0–0.5)
Eosinophils Relative: 4 %
HCT: 40.4 % (ref 39.0–52.0)
Hemoglobin: 13.8 g/dL (ref 13.0–17.0)
Immature Granulocytes: 0 %
Lymphocytes Relative: 21 %
Lymphs Abs: 2.1 10*3/uL (ref 0.7–4.0)
MCH: 30.3 pg (ref 26.0–34.0)
MCHC: 34.2 g/dL (ref 30.0–36.0)
MCV: 88.8 fL (ref 80.0–100.0)
Monocytes Absolute: 0.7 10*3/uL (ref 0.1–1.0)
Monocytes Relative: 7 %
Neutro Abs: 6.6 10*3/uL (ref 1.7–7.7)
Neutrophils Relative %: 68 %
Platelet Count: 199 10*3/uL (ref 150–400)
RBC: 4.55 MIL/uL (ref 4.22–5.81)
RDW: 12.6 % (ref 11.5–15.5)
WBC Count: 9.9 10*3/uL (ref 4.0–10.5)
nRBC: 0 % (ref 0.0–0.2)

## 2021-03-28 LAB — FERRITIN: Ferritin: 110 ng/mL (ref 24–336)

## 2021-03-28 LAB — IRON AND TIBC
Iron: 90 ug/dL (ref 42–163)
Saturation Ratios: 26 % (ref 20–55)
TIBC: 350 ug/dL (ref 202–409)
UIBC: 260 ug/dL (ref 117–376)

## 2021-03-28 LAB — CEA (IN HOUSE-CHCC): CEA (CHCC-In House): 3.94 ng/mL (ref 0.00–5.00)

## 2021-03-28 MED ORDER — HEPARIN SOD (PORK) LOCK FLUSH 100 UNIT/ML IV SOLN
500.0000 [IU] | Freq: Once | INTRAVENOUS | Status: AC | PRN
  Administered 2021-03-28: 500 [IU]
  Filled 2021-03-28: qty 5

## 2021-03-28 MED ORDER — SODIUM CHLORIDE 0.9% FLUSH
10.0000 mL | INTRAVENOUS | Status: DC | PRN
  Administered 2021-03-28: 10 mL
  Filled 2021-03-28: qty 10

## 2021-04-24 ENCOUNTER — Other Ambulatory Visit (HOSPITAL_COMMUNITY): Payer: Self-pay

## 2021-04-24 MED ORDER — PEG 3350-KCL-NA BICARB-NACL 420 G PO SOLR
ORAL | 0 refills | Status: DC
  Filled 2021-04-24: qty 4000, 1d supply, fill #0

## 2021-04-25 ENCOUNTER — Other Ambulatory Visit (HOSPITAL_COMMUNITY): Payer: Self-pay

## 2021-04-25 MED ORDER — PANTOPRAZOLE SODIUM 40 MG PO TBEC
DELAYED_RELEASE_TABLET | ORAL | 3 refills | Status: DC
  Filled 2021-04-25: qty 90, 90d supply, fill #0

## 2021-04-30 ENCOUNTER — Other Ambulatory Visit: Payer: Self-pay

## 2021-04-30 ENCOUNTER — Encounter: Payer: Self-pay | Admitting: Hematology

## 2021-04-30 ENCOUNTER — Ambulatory Visit (INDEPENDENT_AMBULATORY_CARE_PROVIDER_SITE_OTHER): Payer: Medicare Other | Admitting: Family Medicine

## 2021-04-30 ENCOUNTER — Encounter: Payer: Self-pay | Admitting: Family Medicine

## 2021-04-30 VITALS — BP 123/64 | HR 94 | Ht 65.0 in | Wt 202.2 lb

## 2021-04-30 DIAGNOSIS — H538 Other visual disturbances: Secondary | ICD-10-CM | POA: Diagnosis not present

## 2021-04-30 NOTE — Progress Notes (Signed)
    SUBJECTIVE:   CHIEF COMPLAINT / HPI:   [place] - interpreter  Blurred vision: this has been ongoing for two weeks.  Both eyes are blurred. Far away vision is blurred.  Vision gradually worsened.  He has some readers he bought at [employer] with a  1.75+ prescription.  No peripheral vision changes. No eye pain or headaches.  He was sent a referral to the ophthalmologist at last visit but hasn't heard from anyone yet.   PERTINENT  PMH / PSH: DM, stage IV colon cancer.   OBJECTIVE:   BP 123/64   Pulse 94   Ht 5\' 5"  (1.651 m)   Wt 202 lb 3.2 oz (91.7 kg)   SpO2 96%   BMI 33.65 kg/m   Gen: alert, oriented. No acute distress.  HEENT: constricted pupils b/l.  Reactive pupils.  EOMI.  Normal sclera.  Normal peripheral vision.   ASSESSMENT/PLAN:   Blurred vision Appears to be myopia.  Pt also has degree of preexisting hyperopia.  No other vision changes noted.  Exam normal.  Will forward to referral coordinator to see if the ophthalmology referral has been received.  Have scheduled appt with pcp for next week to discuss DM.       Benay Pike, MD [place]

## 2021-04-30 NOTE — Assessment & Plan Note (Signed)
Appears to be myopia.  Pt also has degree of preexisting hyperopia.  No other vision changes noted.  Exam normal.  Will forward to referral coordinator to see if the ophthalmology referral has been received.  Have scheduled appt with pcp for next week to discuss DM.

## 2021-04-30 NOTE — Patient Instructions (Signed)
It was nice to see you today,  I will resend your ophthalmology referral.  Someone should call you within the week.  I have also scheduled you an appointment with your PCP, Dr. Oleh Genin, for 1 week from today on May 31.  Have a great day,  Clemetine Marker, MD  Fue agradable verte hoy,  Reenviar su derivacin de oftalmologa. Alguien debera llamarte dentro de la semana.  [facility] program una cita con su PCP, el Dr. Oleh Genin, para 1 semana a partir de hoy, [place].  Qu tengas un lindo da,  Dr. Clemetine Marker

## 2021-05-01 ENCOUNTER — Other Ambulatory Visit: Payer: Self-pay | Admitting: Family Medicine

## 2021-05-01 DIAGNOSIS — E1165 Type 2 diabetes mellitus with hyperglycemia: Secondary | ICD-10-CM

## 2021-05-01 NOTE — Progress Notes (Signed)
Sent new referral.

## 2021-05-03 ENCOUNTER — Other Ambulatory Visit (HOSPITAL_COMMUNITY): Payer: Self-pay

## 2021-05-07 ENCOUNTER — Ambulatory Visit (INDEPENDENT_AMBULATORY_CARE_PROVIDER_SITE_OTHER): Payer: Medicare Other | Admitting: Family Medicine

## 2021-05-07 ENCOUNTER — Encounter: Payer: Self-pay | Admitting: Family Medicine

## 2021-05-07 ENCOUNTER — Other Ambulatory Visit: Payer: Self-pay

## 2021-05-07 VITALS — BP 149/68 | HR 74 | Ht 65.0 in | Wt 204.2 lb

## 2021-05-07 DIAGNOSIS — E7800 Pure hypercholesterolemia, unspecified: Secondary | ICD-10-CM | POA: Diagnosis not present

## 2021-05-07 DIAGNOSIS — H538 Other visual disturbances: Secondary | ICD-10-CM | POA: Diagnosis not present

## 2021-05-07 DIAGNOSIS — E1165 Type 2 diabetes mellitus with hyperglycemia: Secondary | ICD-10-CM

## 2021-05-07 LAB — POCT GLYCOSYLATED HEMOGLOBIN (HGB A1C): HbA1c, POC (controlled diabetic range): 8.7 % — AB (ref 0.0–7.0)

## 2021-05-07 MED ORDER — ATORVASTATIN CALCIUM 40 MG PO TABS: 40.0000 mg | ORAL_TABLET | Freq: Every day | ORAL | 3 refills | Status: DC

## 2021-05-07 NOTE — Progress Notes (Signed)
    SUBJECTIVE:   CHIEF COMPLAINT / HPI:   Type 2 diabetes  Blurred vision Patient checks his sugars about once per week, can measure anywhere between 118-230. Currently on glipizide, Januvia, metformin, and atorvastatin. Has been having blurred vision and is wanting to make sure his opthalmology referral has been placed.  PERTINENT  PMH / PSH: Reviewed  OBJECTIVE:   BP (!) 149/68   Pulse 74   Ht 5\' 5"  (1.651 m)   Wt 204 lb 3.2 oz (92.6 kg)   SpO2 99%   BMI 33.98 kg/m   General: NAD, well-appearing, well-nourished Respiratory: No respiratory distress, breathing comfortably, able to speak in full sentences Skin: warm and dry, no rashes noted on exposed skin Psych: Appropriate affect and mood   ASSESSMENT/PLAN:   Diabetes type 2, uncontrolled (HCC) Current medications include glipizide 10 mg, metformin 1000 mg twice daily, Januvia 50 mg daily, atorvastatin 40 mg.  A1c today was 8.7, which is increased from 8.43 months ago.  Patient does not think he would benefit from a nutrition referral.  He is open to discussing medication changes with pharmacy team.  Previously discussed discontinuing glipizide and exchanging for SGLT2 or GLP-1. - Patient to follow-up with pharmacy team in the next 1 to 2 weeks to discuss to diabetic medications.  Blurred vision Discussed with patient that ophthalmology referral was placed at last visit.  Patient given contact information for [facility] and told to call if he does not hear back in the next 1 to 2 weeks.  Pure hypercholesterolemia Refilled atorvastatin 40 mg daily     Depaul Arizpe, [place]

## 2021-05-07 NOTE — Patient Instructions (Addendum)
Su A1c de hoy fue de 8,7, que aument de 8,3 en su ltima visita. Me gustara que programe una cita con nuestro equipo de farmacia para obtener los mejores medicamentos para su diabetes. Su clnica tiene citas los lunes y [place].  Ya se ha enviado una derivacin al especialista de la vista y deberan estar comunicndose con usted. aqu est su nmero de telfono en caso de que no tenga noticias de ellos en las prximas 1-2 semanas   [place]: Address: [address], [place], [place] [address] Phone: [phone]

## 2021-05-07 NOTE — Assessment & Plan Note (Signed)
Refilled atorvastatin 40 mg daily

## 2021-05-07 NOTE — Assessment & Plan Note (Signed)
Discussed with patient that ophthalmology referral was placed at last visit.  Patient given contact information for [facility] and told to call if he does not hear back in the next 1 to 2 weeks.

## 2021-05-07 NOTE — Assessment & Plan Note (Addendum)
Current medications include glipizide 10 mg, metformin 1000 mg twice daily, Januvia 50 mg daily, atorvastatin 40 mg.  A1c today was 8.7, which is increased from 8.43 months ago.  Patient does not think he would benefit from a nutrition referral.  He is open to discussing medication changes with pharmacy team.  Previously discussed discontinuing glipizide and exchanging for SGLT2 or GLP-1. - Patient to follow-up with pharmacy team in the next 1 to 2 weeks to discuss to diabetic medications.

## 2021-05-09 ENCOUNTER — Encounter: Payer: Self-pay | Admitting: Hematology

## 2021-05-09 ENCOUNTER — Inpatient Hospital Stay: Payer: Medicare Other | Attending: Hematology | Admitting: Hematology

## 2021-05-09 ENCOUNTER — Other Ambulatory Visit: Payer: Self-pay

## 2021-05-09 ENCOUNTER — Telehealth: Payer: Self-pay | Admitting: Hematology

## 2021-05-09 VITALS — BP 138/74 | HR 76 | Temp 97.9°F | Resp 18 | Ht 65.0 in | Wt 203.3 lb

## 2021-05-09 DIAGNOSIS — E7800 Pure hypercholesterolemia, unspecified: Secondary | ICD-10-CM | POA: Diagnosis not present

## 2021-05-09 DIAGNOSIS — E119 Type 2 diabetes mellitus without complications: Secondary | ICD-10-CM | POA: Insufficient documentation

## 2021-05-09 DIAGNOSIS — G620 Drug-induced polyneuropathy: Secondary | ICD-10-CM | POA: Insufficient documentation

## 2021-05-09 DIAGNOSIS — R109 Unspecified abdominal pain: Secondary | ICD-10-CM | POA: Diagnosis not present

## 2021-05-09 DIAGNOSIS — C8200 Follicular lymphoma grade I, unspecified site: Secondary | ICD-10-CM | POA: Diagnosis not present

## 2021-05-09 DIAGNOSIS — Z79899 Other long term (current) drug therapy: Secondary | ICD-10-CM | POA: Insufficient documentation

## 2021-05-09 DIAGNOSIS — R739 Hyperglycemia, unspecified: Secondary | ICD-10-CM | POA: Insufficient documentation

## 2021-05-09 DIAGNOSIS — C182 Malignant neoplasm of ascending colon: Secondary | ICD-10-CM | POA: Insufficient documentation

## 2021-05-09 DIAGNOSIS — T451X5A Adverse effect of antineoplastic and immunosuppressive drugs, initial encounter: Secondary | ICD-10-CM | POA: Diagnosis not present

## 2021-05-09 MED ORDER — GABAPENTIN 300 MG PO CAPS: 300.0000 mg | ORAL_CAPSULE | Freq: Three times a day (TID) | ORAL | 3 refills | Status: DC

## 2021-05-09 NOTE — Telephone Encounter (Signed)
Rescheduled upcoming appointment per 6/2 los. Patient is aware of changes.

## 2021-05-09 NOTE — Progress Notes (Signed)
[place]   [phone] Fax:(336) [phone]   Clinic Follow up Note   Patient Care Team: [name], DO as PCP - General (Family Medicine) [name], MD as Consulting Physician (Colon and Rectal Surgery) [name], MD as Consulting Physician (Hematology)  Date of Service:  05/09/2021  CHIEF COMPLAINT: f/u of colon cancer  SUMMARY OF ONCOLOGIC HISTORY: Oncology History Overview Note  Cancer Staging Cancer of right colon [facility]) Staging form: Colon and Rectum, AJCC 8th Edition - Pathologic stage from 10/21/2018: Stage IVC (pT4b, pN1a, pM1c) - Signed by [name], MD on 63/07/4664  Follicular lymphoma ([place]), History of Staging form: Lymphoid Neoplasms, AJCC 6th Edition - Clinical: Stage II - Signed by Curt Bears, MD on 9/93/5701     Follicular lymphoma [facility]), History of  11/2009 Initial Diagnosis   Follicular lymphoma ([place]), History of    Chemotherapy   1) Status post 6 cycles of systemic chemotherapy with CHOP/Rituxan last dose given 05/01/2009.  2) Maintenance Rituxan at 375 mg per meter square given every 2 months status post 12 cycles    Cancer of right colon ([place])  10/21/2018 Surgery   Exploratory laparotomy right hemicolectomy by Dr. [name] and Dr. [name]  10/21/18   10/21/2018 Pathology Results   Diagnosis 10/21/18 Colon, segmental resection for tumor, right ascending and appendix - ADENOCARCINOMA, MODERATE TO POORLY DIFFERENTIATED (4 CM) - METASTATIC CARCINOMA INVOLVING ONE OF EIGHTEEN LYMPH NODES (1/18) - CARCINOMA EXTENDS INTO THE APPENDIX - TWO TUMOR DEPOSITS PRESENT - SEE ONCOLOGY TABLE AND COMMENT BELOW   10/21/2018 Cancer Staging   Staging form: Colon and Rectum, AJCC 8th Edition - Pathologic stage from 10/21/2018: Stage IVC (pT4b, pN1a, pM1c) - Signed by [name], MD on 11/13/2018   11/04/2018 Imaging   CT AP W Contrast 11/04/18  IMPRESSION: 1. Interval appendectomy. There is residual soft tissue  thickening and stranding in the right lower quadrant adjacent to the cecum which may represent ongoing inflammation versus postsurgical changes. A small soft tissue density adjacent to the surgical sutures may reflect small hematoma or operative collection. No large focal fluid collection to suggest drainable abscess allowing for absence of contrast 2. Fluid-filled colon without wall thickening, could reflect diarrheal process   11/08/2018 Imaging   CT Chest W Contrast 11/08/18  IMPRESSION: 1. No acute findings are noted in the thorax to account for the patient's symptoms. 2. Aortic atherosclerosis, in addition to left main and 3 vessel coronary artery disease. Please note that although the presence of coronary artery calcium documents the presence of coronary artery disease, the severity of this disease and any potential stenosis cannot be assessed on this non-gated CT examination. Assessment for potential risk factor modification, dietary therapy or pharmacologic therapy may be warranted, if clinically indicated. 3. Additional incidental findings, as above. Aortic Atherosclerosis (ICD10-I70.0).   11/12/2018 Initial Diagnosis   Cancer of right colon ([place])   11/25/2018 - 05/05/2019 Chemotherapy   FOLFOX q2weeks starting 11/25/18. Due to moderate thrombocytopenia, I will stop 5-FU bolus, and reduce pump infusion to 2245m/m2 starting with cycle 7.  Plan for last treatment on 05/05/19.    02/10/2019 Imaging   CT AP W Contrast  IMPRESSION: 1. No evidence metastatic disease. 2. Hepatic steatosis. 3. Mildly enlarged prostate. 4. Aortic atherosclerosis (ICD10-170.0). coronary artery calcification.   09/02/2019 Imaging   CT AP W Contrast IMPRESSION: 1. No signs to suggest metastatic disease in the abdomen or pelvis. 2. Aortic atherosclerosis these, as well as right coronary artery disease.  03/01/2020 Imaging   CT CAP W Contrast  IMPRESSION: 1. Stable exam. No new or progressive  findings to suggest recurrent or metastatic disease. 2. Status post right hemicolectomy. 3. Aortic Atherosclerosis (ICD10-I70.0).   09/07/2020 Imaging   CT AP  IMPRESSION: 1. Status post right hemicolectomy, without recurrent or metastatic disease. 2. Coronary artery atherosclerosis. Aortic Atherosclerosis (ICD10-I70.0).      CURRENT THERAPY:  Surveillance  INTERVAL HISTORY:  Blake Vaughn is here for a follow up of colon cancer. He was last seen by me on 03/28/21. He presents to the clinic accompanied by our translator Almyra Free. He is doing okay, waiting on results of his colonoscopy. We requested the results from [facility] this morning. He notes some residual abdominal pain, but it has improved since. He has no more abdominal tenderness. He notes continued neuropathy in his feet, which he feels has worsened. He notes he does okay while walking, but once he relaxes, the symptoms return. He has also fallen since his last visit. He notes he experiences a fall about once a month.   All other systems were reviewed with the patient and are negative.  MEDICAL HISTORY:  Past Medical History:  Diagnosis Date  . Appendicitis 10/21/2018  . Diabetes mellitus   . Follicular lymphoma ([place]), 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   Qualifier: Diagnosis of  By: [name], [name]   . GI bleed 11/05/2018  . Hypercholesterolemia   . Hypertension 11/27/2011  . Lymphoma [facility])    gets annual chemo last tx Feb 2013  . TOBACCO USE, QUIT 08/18/2009   Qualifier: Diagnosis of  By: Drue Flirt  MD, Merrily Brittle      SURGICAL HISTORY: Past Surgical History:  Procedure Laterality Date  . ESOPHAGOGASTRODUODENOSCOPY (EGD) WITH PROPOFOL N/A 11/05/2018   Procedure: ESOPHAGOGASTRODUODENOSCOPY (EGD) WITH PROPOFOL;  Surgeon: [name], MD;  Location: [place];  Service: Endoscopy;  Laterality: N/A;  . IR IMAGING GUIDED PORT INSERTION  11/24/2018  . LAPAROSCOPIC APPENDECTOMY N/A 10/21/2018   Procedure: Exploratory  laparotomy right hemicolectomy;  Surgeon: [name], MD;  Location: [place];  Service: General;  Laterality: N/A;    I have reviewed the social history and family history with the patient and they are unchanged from previous note.  ALLERGIES:  is allergic to iohexol.  MEDICATIONS:  Current Outpatient Medications  Medication Sig Dispense Refill  . Accu-Chek Softclix Lancets lancets Use as instructed 100 each 12  . atorvastatin (LIPITOR) 40 MG tablet Take 1 tablet (40 mg total) by mouth daily. 90 tablet 3  . Blood Glucose Monitoring Suppl (ACCU-CHEK AVIVA PLUS) w/Device KIT Inject 1 kit into the skin 2 (two) times daily. 1 kit 0  . gabapentin (NEURONTIN) 300 MG capsule Take 1 capsule (300 mg total) by mouth 3 (three) times daily. 90 capsule 3  . glipiZIDE (GLUCOTROL XL) 10 MG 24 hr tablet TAKE 1 TABLET BY MOUTH ONCE DAILY BREAKFAST. 90 tablet 0  . glucose blood (ACCU-CHEK AVIVA PLUS) test strip Use as instructed 100 each 12  . lidocaine-prilocaine (EMLA) cream Apply 1 application topically as needed. 30 g 1  . metFORMIN (GLUCOPHAGE) 1000 MG tablet Take 1 tablet (1,000 mg total) by mouth 2 (two) times daily with a meal. 180 tablet 3  . sitaGLIPtin (JANUVIA) 50 MG tablet Take 1 tablet (50 mg total) by mouth daily. 90 tablet 3   No current facility-administered medications for this visit.    PHYSICAL EXAMINATION: ECOG PERFORMANCE STATUS: 0 - Asymptomatic  Vitals:   05/09/21 1120  BP: 138/74  Pulse: 76  Resp: 18  Temp: 97.9 F (36.6 C)  SpO2: 100%   Filed Weights   05/09/21 1120  Weight: 203 lb 4.8 oz (92.2 kg)    GENERAL:alert, no distress and comfortable SKIN: skin color, texture, turgor are normal, no rashes or significant lesions EYES: normal, Conjunctiva are pink and non-injected, sclera clear  NECK: supple, thyroid normal size, non-tender, without nodularity LYMPH:  no palpable lymphadenopathy in the cervical, axillary  LUNGS: clear to auscultation and percussion  with normal breathing effort HEART: regular rate & rhythm and no murmurs and no lower extremity edema ABDOMEN:abdomen soft, non-tender and normal bowel sounds Musculoskeletal:no cyanosis of digits and no clubbing  NEURO: alert & oriented x 3 with fluent speech, no focal motor/sensory deficits  LABORATORY DATA:  I have reviewed the data as listed CBC Latest Ref Rng & Units 03/28/2021 11/26/2020 09/07/2020  WBC 4.0 - 10.5 K/uL 9.9 10.5 11.0(H)  Hemoglobin 13.0 - 17.0 g/dL 13.8 14.0 13.4  Hematocrit 39.0 - 52.0 % 40.4 40.9 38.2(L)  Platelets 150 - 400 K/uL 199 218 207     CMP Latest Ref Rng & Units 03/28/2021 11/26/2020 09/07/2020  Glucose 70 - 99 mg/dL 407(H) 143(H) 248(H)  BUN 6 - 20 mg/dL 24(H) 19 19  Creatinine 0.61 - 1.24 mg/dL 1.23 0.91 1.03  Sodium 135 - 145 mmol/L 133(L) 138 132(L)  Potassium 3.5 - 5.1 mmol/L 4.7 4.6 4.8  Chloride 98 - 111 mmol/L 99 106 103  CO2 22 - 32 mmol/L '24 23 22  ' Calcium 8.9 - 10.3 mg/dL 9.5 9.4 9.3  Total Protein 6.5 - 8.1 g/dL 6.4(L) 6.5 6.9  Total Bilirubin 0.3 - 1.2 mg/dL 0.4 0.4 0.4  Alkaline Phos 38 - 126 U/L 87 74 76  AST 15 - 41 U/L '17 15 17  ' ALT 0 - 44 U/L '17 22 20      ' RADIOGRAPHIC STUDIES: I have personally reviewed the radiological images as listed and agreed with the findings in the report. No results found.   ASSESSMENT & PLAN:  EOIN WILLDEN is a 60 y.o. male with   1.RightCecal adenocarcinoma, pT4bN1aM1c,Stage IVC,with limited peritoneal metastasis,resected,Grade II-III, MMR normal -Diagnosed in 10/2018. Treated with right hemicolectomy.He was found to have 2 smallperitoneal metastasis during the surgery, which were removed. -He completed 12 cycles of adjuvant FOLFOX.Now on surveillance. -CT A/P on 09/07/20 was negative -He underwent endo/colonoscopy on 05/03/21 under Dr. Cristina Gong at [place]. He notes they saw two suspicious spots. He isn't sure if they took a sample. He is still waiting to hear the results. We also  requested results. -He is clinically doing well, previous abdominal pain has resolved.  Exam unremarkable today. -We will continue cancer surveillance.  He will undergo next surveillance CT scan in 15/7262.  2. H/o Follicular Lymphoma -Was treated with CHOP/Rituxan in 2010.Then he waspreviouslytreated withMaintenance Rituxancompleted in 2012.Last f/u was in 2017.  3. DM and Hypercholesterolemia, uncontrolled hyperglycemia -On metformin,Glimepiride,Lipitor and metoprolol. -His CT AP from 09/02/19 also shows aortic atherosclerosis, which can increase his risk for MI. -He does use Prednisone before CT scans. He is also to hold metformin 2 days before scan. I advised him to closely watch his BG at home.   5. Neuropathy, G1  -Secondary toprior chemo Oxaliplatin -He reports it is worsening, and he falls about once a month. -I recommend he take B vitamins. We can also try gabapentin again, which he states he last took over a year ago. He is agreeable. -  I informed him that his DM can also cause issues with his feet.   Plan -I refilled gabapentin for him today. -we will requestolonoscopy results. I will call him (through [place]) with the results. -Follow-up in 3 months with lab and restaging CT a few days before.  CT scan was ordered on last visit. -Port flush every 2 months    No problem-specific Assessment & Plan notes found for this encounter.   No orders of the defined types were placed in this encounter.  All questions were answered. The patient knows to call the clinic with any problems, questions or concerns. No barriers to learning was detected. The total time spent in the appointment was 30 minutes.     [name], MD 05/09/2021   I, Wilburn Mylar, am acting as scribe for [name], MD.   I have reviewed the above documentation for accuracy and completeness, and I agree with the above.

## 2021-05-12 NOTE — Progress Notes (Deleted)
    SUBJECTIVE:   CHIEF COMPLAINT / HPI:   Medication management: Patient is a 60 year old male who presents today for medication management. He was previously noted to have an A1c which increased from 8.3-8.7.  Current diabetic medications include glipizide 10 mg daily, metformin 1000 mg twice daily, Januvia 50 mg daily. At his last appointment with his PCP it was discussed on potentially exchanging glipizide for a SGLT2 inhibitor.  Today the patient states***.  ***Spanish interpreter used for the duration of the patient encounter  PERTINENT  PMH / PSH: Type 2 diabetes  OBJECTIVE:   There were no vitals taken for this visit.   General: NAD, pleasant, able to participate in exam Cardiac: RRR, no murmurs. Respiratory: CTAB, normal effort Abdomen: Bowel sounds present, nontender, nondistended Psych: Normal affect and mood  ASSESSMENT/PLAN:   No problem-specific Assessment & Plan notes found for this encounter.     Lurline Del, [place]    This note was prepared using Dragon voice recognition software and may include unintentional dictation errors due to the inherent limitations of voice recognition software.  {    This will disappear when note is signed, click to select method of visit    :1}

## 2021-05-13 ENCOUNTER — Ambulatory Visit: Payer: Medicare Other | Admitting: Family Medicine

## 2021-05-29 ENCOUNTER — Other Ambulatory Visit (HOSPITAL_COMMUNITY): Payer: Self-pay

## 2021-05-30 ENCOUNTER — Inpatient Hospital Stay: Payer: Medicare Other

## 2021-05-30 ENCOUNTER — Other Ambulatory Visit: Payer: Self-pay

## 2021-05-30 DIAGNOSIS — C182 Malignant neoplasm of ascending colon: Secondary | ICD-10-CM

## 2021-05-30 DIAGNOSIS — Z95828 Presence of other vascular implants and grafts: Secondary | ICD-10-CM

## 2021-05-30 DIAGNOSIS — D500 Iron deficiency anemia secondary to blood loss (chronic): Secondary | ICD-10-CM

## 2021-05-30 LAB — CBC WITH DIFFERENTIAL (CANCER CENTER ONLY)
Abs Immature Granulocytes: 0.02 10*3/uL (ref 0.00–0.07)
Basophils Absolute: 0 10*3/uL (ref 0.0–0.1)
Basophils Relative: 0 %
Eosinophils Absolute: 0.6 10*3/uL — ABNORMAL HIGH (ref 0.0–0.5)
Eosinophils Relative: 7 %
HCT: 39.7 % (ref 39.0–52.0)
Hemoglobin: 13.9 g/dL (ref 13.0–17.0)
Immature Granulocytes: 0 %
Lymphocytes Relative: 25 %
Lymphs Abs: 2 10*3/uL (ref 0.7–4.0)
MCH: 30.6 pg (ref 26.0–34.0)
MCHC: 35 g/dL (ref 30.0–36.0)
MCV: 87.4 fL (ref 80.0–100.0)
Monocytes Absolute: 0.8 10*3/uL (ref 0.1–1.0)
Monocytes Relative: 9 %
Neutro Abs: 4.8 10*3/uL (ref 1.7–7.7)
Neutrophils Relative %: 59 %
Platelet Count: 174 10*3/uL (ref 150–400)
RBC: 4.54 MIL/uL (ref 4.22–5.81)
RDW: 12.3 % (ref 11.5–15.5)
WBC Count: 8.2 10*3/uL (ref 4.0–10.5)
nRBC: 0 % (ref 0.0–0.2)

## 2021-05-30 LAB — CMP (CANCER CENTER ONLY)
ALT: 21 U/L (ref 0–44)
AST: 19 U/L (ref 15–41)
Albumin: 3.4 g/dL — ABNORMAL LOW (ref 3.5–5.0)
Alkaline Phosphatase: 65 U/L (ref 38–126)
Anion gap: 9 (ref 5–15)
BUN: 19 mg/dL (ref 6–20)
CO2: 24 mmol/L (ref 22–32)
Calcium: 9.5 mg/dL (ref 8.9–10.3)
Chloride: 105 mmol/L (ref 98–111)
Creatinine: 0.97 mg/dL (ref 0.61–1.24)
GFR, Estimated: >60 mL/min (ref >60)
Glucose, Bld: 224 mg/dL — ABNORMAL HIGH (ref 70–99)
Potassium: 4.7 mmol/L (ref 3.5–5.1)
Sodium: 138 mmol/L (ref 135–145)
Total Bilirubin: 0.6 mg/dL (ref 0.3–1.2)
Total Protein: 6.5 g/dL (ref 6.5–8.1)

## 2021-05-30 LAB — IRON AND TIBC
Iron: 122 ug/dL (ref 42–163)
Saturation Ratios: 35 % (ref 20–55)
TIBC: 348 ug/dL (ref 202–409)
UIBC: 225 ug/dL (ref 117–376)

## 2021-05-30 LAB — CEA (IN HOUSE-CHCC): CEA (CHCC-In House): 4.29 ng/mL (ref 0.00–5.00)

## 2021-05-30 LAB — FERRITIN: Ferritin: 150 ng/mL (ref 24–336)

## 2021-05-30 MED ORDER — SODIUM CHLORIDE 0.9% FLUSH
10.0000 mL | INTRAVENOUS | Status: DC | PRN
  Administered 2021-05-30: 10 mL
  Filled 2021-05-30: qty 10

## 2021-05-30 MED ORDER — HEPARIN SOD (PORK) LOCK FLUSH 100 UNIT/ML IV SOLN
500.0000 [IU] | Freq: Once | INTRAVENOUS | Status: AC | PRN
  Administered 2021-05-30: 500 [IU]
  Filled 2021-05-30: qty 5

## 2021-06-11 ENCOUNTER — Telehealth: Payer: Self-pay

## 2021-06-11 NOTE — Telephone Encounter (Signed)
I contacted [employer] and spoke with translator Eh [phone] in order to relay the patient's lab results. The patient answered the phone, was made aware of lab results, and verbalized understanding. The patient confirmed that he will follow up with his PCP regarding the elevated glucose.    Blake [name], MD  P [facility] Mo Pod 1 Please let pt know his lab results, CBC, CMP, iron study and tumor marker are all WNL except glucose level was high, f/u with PCP, no other concerns. I also reviewed his last colonoscopy report, he has two polyps removed but otherwise good, thanks   Blake Vaughn  06/08/2021

## 2021-06-12 ENCOUNTER — Other Ambulatory Visit: Payer: Self-pay

## 2021-06-12 DIAGNOSIS — E1165 Type 2 diabetes mellitus with hyperglycemia: Secondary | ICD-10-CM

## 2021-06-12 MED ORDER — GLIPIZIDE ER 10 MG PO TB24
ORAL_TABLET | ORAL | 0 refills | Status: DC
Start: 2021-06-12 — End: 2021-10-23

## 2021-06-14 ENCOUNTER — Other Ambulatory Visit: Payer: Self-pay

## 2021-06-14 DIAGNOSIS — E1165 Type 2 diabetes mellitus with hyperglycemia: Secondary | ICD-10-CM

## 2021-08-01 ENCOUNTER — Other Ambulatory Visit: Payer: Self-pay

## 2021-08-01 ENCOUNTER — Inpatient Hospital Stay: Payer: Medicare Other | Attending: Hematology

## 2021-08-01 DIAGNOSIS — Z95828 Presence of other vascular implants and grafts: Secondary | ICD-10-CM

## 2021-08-01 DIAGNOSIS — D500 Iron deficiency anemia secondary to blood loss (chronic): Secondary | ICD-10-CM

## 2021-08-01 DIAGNOSIS — C182 Malignant neoplasm of ascending colon: Secondary | ICD-10-CM | POA: Insufficient documentation

## 2021-08-01 DIAGNOSIS — D509 Iron deficiency anemia, unspecified: Secondary | ICD-10-CM | POA: Diagnosis present

## 2021-08-01 LAB — IRON AND TIBC
Iron: 127 ug/dL (ref 42–163)
Saturation Ratios: 35 % (ref 20–55)
TIBC: 365 ug/dL (ref 202–409)
UIBC: 238 ug/dL (ref 117–376)

## 2021-08-01 LAB — CMP (CANCER CENTER ONLY)
ALT: 22 U/L (ref 0–44)
AST: 17 U/L (ref 15–41)
Albumin: 3.7 g/dL (ref 3.5–5.0)
Alkaline Phosphatase: 83 U/L (ref 38–126)
Anion gap: 9 (ref 5–15)
BUN: 21 mg/dL — ABNORMAL HIGH (ref 6–20)
CO2: 24 mmol/L (ref 22–32)
Calcium: 9.5 mg/dL (ref 8.9–10.3)
Chloride: 100 mmol/L (ref 98–111)
Creatinine: 1.4 mg/dL — ABNORMAL HIGH (ref 0.61–1.24)
GFR, Estimated: 58 mL/min — ABNORMAL LOW (ref >60)
Glucose, Bld: 329 mg/dL — ABNORMAL HIGH (ref 70–99)
Potassium: 5 mmol/L (ref 3.5–5.1)
Sodium: 133 mmol/L — ABNORMAL LOW (ref 135–145)
Total Bilirubin: 0.7 mg/dL (ref 0.3–1.2)
Total Protein: 6.7 g/dL (ref 6.5–8.1)

## 2021-08-01 LAB — CBC WITH DIFFERENTIAL (CANCER CENTER ONLY)
Abs Immature Granulocytes: 0.02 10*3/uL (ref 0.00–0.07)
Basophils Absolute: 0 10*3/uL (ref 0.0–0.1)
Basophils Relative: 0 %
Eosinophils Absolute: 0.4 10*3/uL (ref 0.0–0.5)
Eosinophils Relative: 5 %
HCT: 41.9 % (ref 39.0–52.0)
Hemoglobin: 14.9 g/dL (ref 13.0–17.0)
Immature Granulocytes: 0 %
Lymphocytes Relative: 22 %
Lymphs Abs: 1.8 10*3/uL (ref 0.7–4.0)
MCH: 30.6 pg (ref 26.0–34.0)
MCHC: 35.6 g/dL (ref 30.0–36.0)
MCV: 86 fL (ref 80.0–100.0)
Monocytes Absolute: 0.6 10*3/uL (ref 0.1–1.0)
Monocytes Relative: 7 %
Neutro Abs: 5.3 10*3/uL (ref 1.7–7.7)
Neutrophils Relative %: 66 %
Platelet Count: 219 10*3/uL (ref 150–400)
RBC: 4.87 MIL/uL (ref 4.22–5.81)
RDW: 12.3 % (ref 11.5–15.5)
WBC Count: 8 10*3/uL (ref 4.0–10.5)
nRBC: 0 % (ref 0.0–0.2)

## 2021-08-01 LAB — FERRITIN: Ferritin: 141 ng/mL (ref 24–336)

## 2021-08-01 LAB — CEA (IN HOUSE-CHCC): CEA (CHCC-In House): 3.41 ng/mL (ref 0.00–5.00)

## 2021-08-01 MED ORDER — SODIUM CHLORIDE 0.9% FLUSH
10.0000 mL | INTRAVENOUS | Status: DC | PRN
  Administered 2021-08-01: 10 mL

## 2021-08-01 MED ORDER — HEPARIN SOD (PORK) LOCK FLUSH 100 UNIT/ML IV SOLN
500.0000 [IU] | Freq: Once | INTRAVENOUS | Status: AC | PRN
  Administered 2021-08-01: 500 [IU]

## 2021-08-05 LAB — HM DIABETES EYE EXAM

## 2021-08-13 NOTE — Progress Notes (Deleted)
    SUBJECTIVE:   CHIEF COMPLAINT / HPI:   Spanish interpreter # ***used for entirety of visit  Type 2 diabetes mellitus: Uncontrolled, with neuropathy Patient presents to the clinic for medication concerns. He was last seen 05/07/21 by PCP for diabetes and concerns about continued blurred vision.  He was recommended to follow-up with our pharmacy team in regards to his diabetes medications. Unfortunately, it appears this was not scheduled. Currently, he is taking glipizide 10 mg, metformin 1000 mg twice daily, Januvia 50 mg daily, atorvastatin 40 mg.***  His last hemoglobin A1c in May was 8.7.  Hypertension Not on antihypertensives.  His PCP previously discussed with him the importance and benefits of starting an ACE-I or ARB.  Patient was very reluctant to make any medication changes.    PERTINENT  PMH / PSH:  History of right cecal adenocarcinoma diagnosed in 10/2018 status post right hemicolectomy and 12 cycles of adjuvant FOLFOX.  Next surveillance CT scan is in 09/2021. Follows with Dr. Burr Medico Past Medical History:  Diagnosis Date   Appendicitis 10/21/2018   Diabetes mellitus    Follicular lymphoma ([place]), 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   Qualifier: Diagnosis of  By: [name], [name]    GI bleed 11/05/2018   Hypercholesterolemia    Hypertension 11/27/2011   Lymphoma ([place])    gets annual chemo last tx Feb 2013   TOBACCO USE, QUIT 08/18/2009   Qualifier: Diagnosis of  By: Drue Flirt  MD, Merrily Brittle       OBJECTIVE:   There were no vitals taken for this visit. ***  General: NAD, pleasant, able to participate in exam Cardiac: RRR, no murmurs. Respiratory: CTAB, normal effort, No wheezes, rales or rhonchi Abdomen: Bowel sounds present, nontender, nondistended, no hepatosplenomegaly. Extremities: no edema or cyanosis. Skin: warm and dry, no rashes noted Neuro: alert, no obvious focal deficits Psych: Normal affect and mood  ASSESSMENT/PLAN:   No problem-specific Assessment & Plan notes  found for this encounter.     Sharion Settler, [place]

## 2021-08-14 ENCOUNTER — Telehealth: Payer: Self-pay | Admitting: Family Medicine

## 2021-08-14 ENCOUNTER — Ambulatory Visit: Payer: Medicare Other

## 2021-08-14 NOTE — Telephone Encounter (Signed)
Patient did not show for appointment today.  Two unsuccessful attempts made to reach patient via telephone using [place] interpreter.  No option to leave voicemail.  Patient should reschedule at his convenience.

## 2021-08-19 ENCOUNTER — Encounter: Payer: Self-pay | Admitting: Family Medicine

## 2021-09-18 ENCOUNTER — Other Ambulatory Visit (HOSPITAL_COMMUNITY): Payer: Self-pay

## 2021-09-18 ENCOUNTER — Encounter: Payer: Self-pay | Admitting: Hematology

## 2021-09-18 ENCOUNTER — Other Ambulatory Visit: Payer: Self-pay

## 2021-09-18 DIAGNOSIS — Z91041 Radiographic dye allergy status: Secondary | ICD-10-CM

## 2021-09-18 MED ORDER — DIPHENHYDRAMINE HCL 50 MG PO TABS
50.0000 mg | ORAL_TABLET | Freq: Once | ORAL | 0 refills | Status: DC
  Filled 2021-09-18: qty 1, 1d supply, fill #0

## 2021-09-18 MED ORDER — PREDNISONE 50 MG PO TABS
ORAL_TABLET | ORAL | 0 refills | Status: DC
  Filled 2021-09-18: qty 3, 1d supply, fill #0

## 2021-09-23 ENCOUNTER — Other Ambulatory Visit: Payer: Self-pay

## 2021-09-23 ENCOUNTER — Telehealth: Payer: Self-pay

## 2021-09-24 ENCOUNTER — Inpatient Hospital Stay: Payer: Medicare Other | Attending: Hematology

## 2021-09-24 ENCOUNTER — Other Ambulatory Visit: Payer: Self-pay

## 2021-09-24 ENCOUNTER — Ambulatory Visit (HOSPITAL_COMMUNITY)
Admission: RE | Admit: 2021-09-24 | Discharge: 2021-09-24 | Disposition: A | Discharge status: Home / Self Care | Payer: Medicare Other | Source: Ambulatory Visit | Attending: Hematology | Admitting: Hematology

## 2021-09-24 DIAGNOSIS — C182 Malignant neoplasm of ascending colon: Secondary | ICD-10-CM

## 2021-09-24 DIAGNOSIS — Z95828 Presence of other vascular implants and grafts: Secondary | ICD-10-CM

## 2021-09-24 DIAGNOSIS — D500 Iron deficiency anemia secondary to blood loss (chronic): Secondary | ICD-10-CM

## 2021-09-24 LAB — CBC WITH DIFFERENTIAL (CANCER CENTER ONLY)
Abs Immature Granulocytes: 0.04 10*3/uL (ref 0.00–0.07)
Basophils Absolute: 0 10*3/uL (ref 0.0–0.1)
Basophils Relative: 0 %
Eosinophils Absolute: 0 10*3/uL (ref 0.0–0.5)
Eosinophils Relative: 0 %
HCT: 40.1 % (ref 39.0–52.0)
Hemoglobin: 14.2 g/dL (ref 13.0–17.0)
Immature Granulocytes: 0 %
Lymphocytes Relative: 9 %
Lymphs Abs: 1 10*3/uL (ref 0.7–4.0)
MCH: 30.4 pg (ref 26.0–34.0)
MCHC: 35.4 g/dL (ref 30.0–36.0)
MCV: 85.9 fL (ref 80.0–100.0)
Monocytes Absolute: 0.1 10*3/uL (ref 0.1–1.0)
Monocytes Relative: 1 %
Neutro Abs: 10 10*3/uL — ABNORMAL HIGH (ref 1.7–7.7)
Neutrophils Relative %: 90 %
Platelet Count: 229 10*3/uL (ref 150–400)
RBC: 4.67 MIL/uL (ref 4.22–5.81)
RDW: 12.5 % (ref 11.5–15.5)
WBC Count: 11.1 10*3/uL — ABNORMAL HIGH (ref 4.0–10.5)
nRBC: 0 % (ref 0.0–0.2)

## 2021-09-24 LAB — CMP (CANCER CENTER ONLY)
ALT: 20 U/L (ref 0–44)
AST: 16 U/L (ref 15–41)
Albumin: 4 g/dL (ref 3.5–5.0)
Alkaline Phosphatase: 77 U/L (ref 38–126)
Anion gap: 13 (ref 5–15)
BUN: 19 mg/dL (ref 6–20)
CO2: 21 mmol/L — ABNORMAL LOW (ref 22–32)
Calcium: 9.6 mg/dL (ref 8.9–10.3)
Chloride: 103 mmol/L (ref 98–111)
Creatinine: 1.14 mg/dL (ref 0.61–1.24)
GFR, Estimated: >60 mL/min (ref >60)
Glucose, Bld: 396 mg/dL — ABNORMAL HIGH (ref 70–99)
Potassium: 4.6 mmol/L (ref 3.5–5.1)
Sodium: 137 mmol/L (ref 135–145)
Total Bilirubin: 0.6 mg/dL (ref 0.3–1.2)
Total Protein: 7.2 g/dL (ref 6.5–8.1)

## 2021-09-24 LAB — CEA (IN HOUSE-CHCC): CEA (CHCC-In House): 4.54 ng/mL (ref 0.00–5.00)

## 2021-09-24 LAB — IRON AND TIBC
Iron: 137 ug/dL (ref 42–163)
Saturation Ratios: 37 % (ref 20–55)
TIBC: 370 ug/dL (ref 202–409)
UIBC: 233 ug/dL (ref 117–376)

## 2021-09-24 LAB — FERRITIN: Ferritin: 172 ng/mL (ref 24–336)

## 2021-09-24 IMAGING — CT CT CHEST-ABD-PELV W/ CM
3 of 5 series · 14 of 36 positions shown, 16 images · IV contrast (APPLIED)
Comparison: CT abdomen pelvis, [DATE] 7 CT chest 7 9 isthmus
[REDACTED]

CLINICAL DATA: Colon cancer surveillance, status post right
hemicolectomy and chemotherapy

EXAM:
CT CHEST, ABDOMEN, AND PELVIS WITH CONTRAST
TECHNIQUE: Multidetector CT imaging of the chest, abdomen and pelvis was
performed following the standard protocol during bolus
administration of intravenous contrast.
CONTRAST:  80mL OMNIPAQUE IOHEXOL 350 MG/ML SOLN, additional oral
enteric contrast

[Series 2: cap with · axial · 0.87mm/px · z∈[-516,-0]mm · 10 of 127 slices shown, 12 images]
[im 12/127  mediastinal]
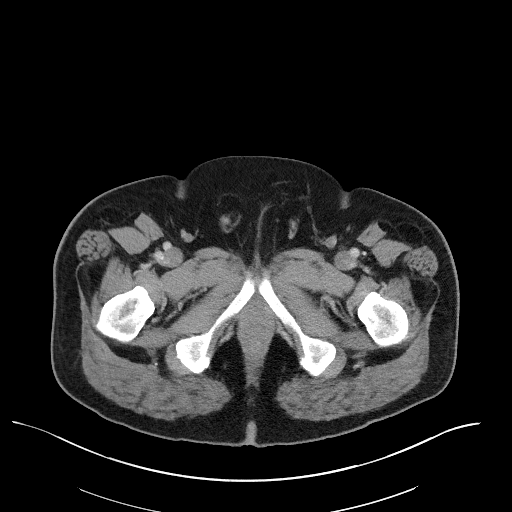
[im 12/127  bone]
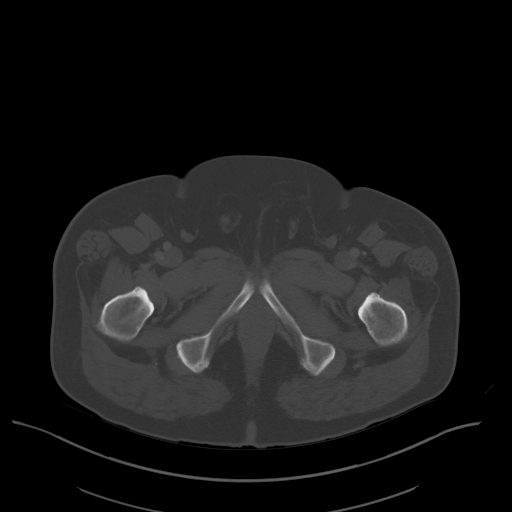
[im 23/127  mediastinal]
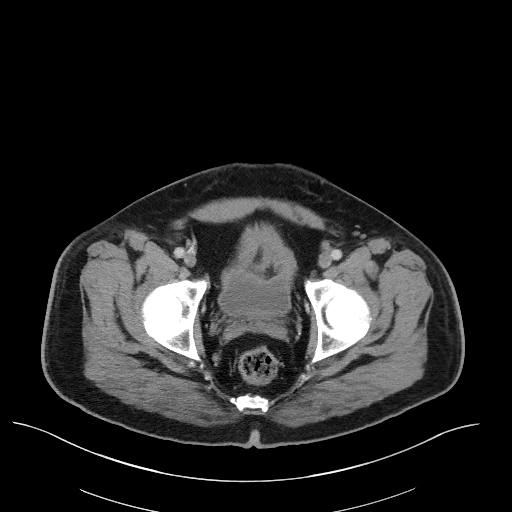
[im 35/127  mediastinal]
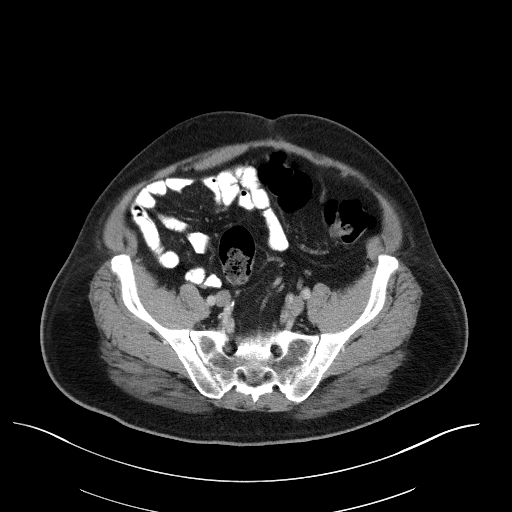
[im 46/127  mediastinal]
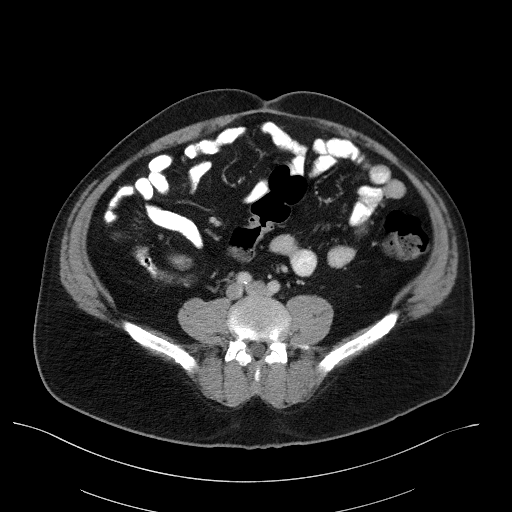
[im 58/127  mediastinal]
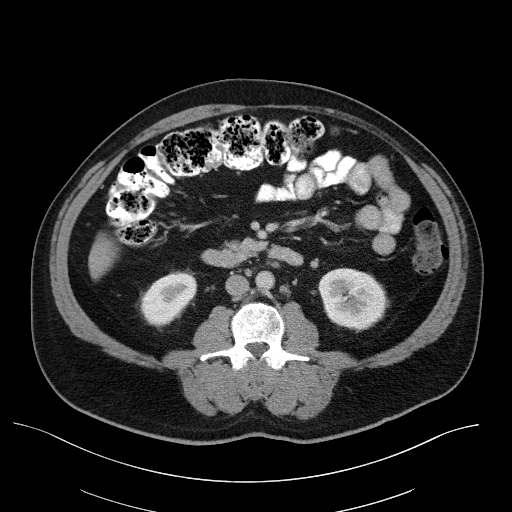
[im 69/127  mediastinal]
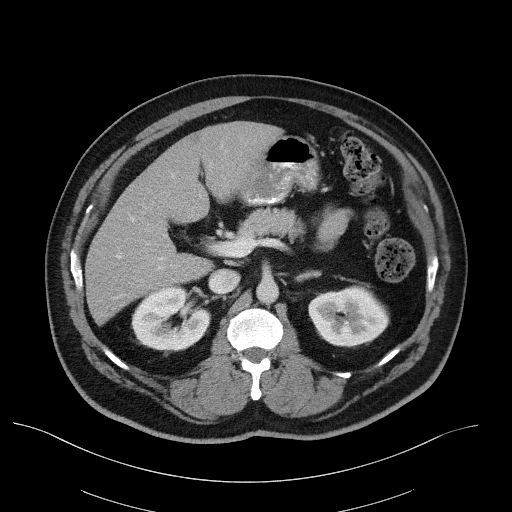
[im 81/127  mediastinal]
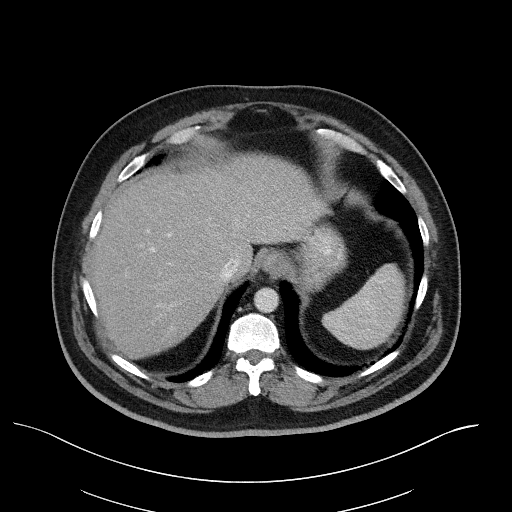
[im 92/127  mediastinal]
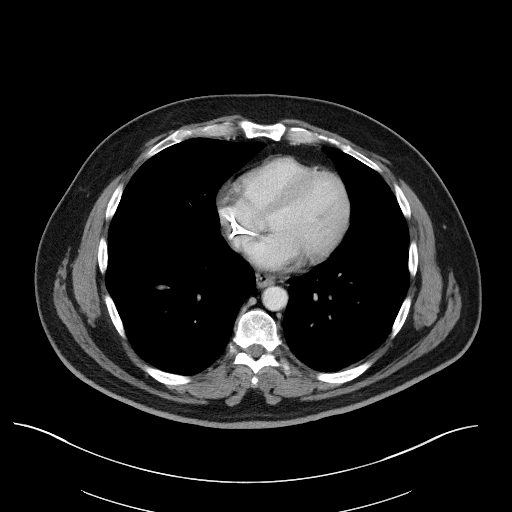
[im 104/127  mediastinal]
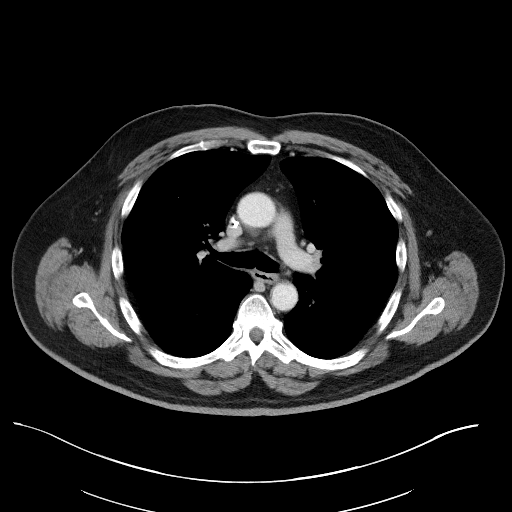
[im 104/127  bone]
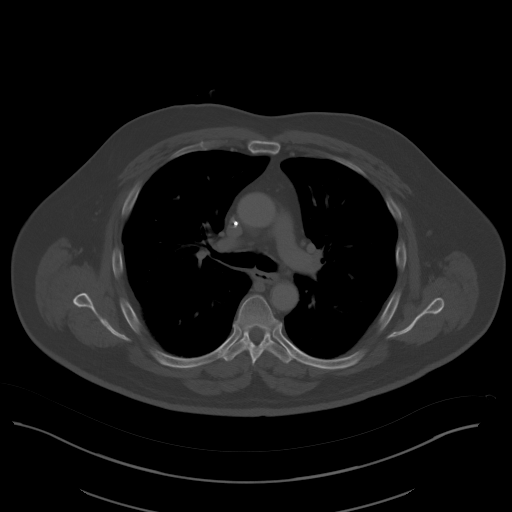
[im 115/127  mediastinal]
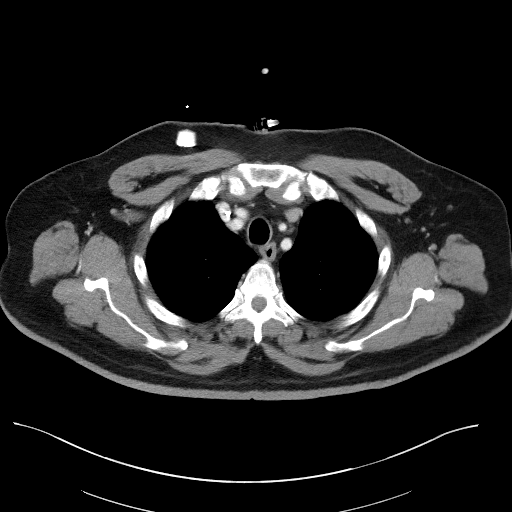

[Series 4: lung · axial · 0.74mm/px · 1 of 149 slices shown]
[im 13/149  bone]
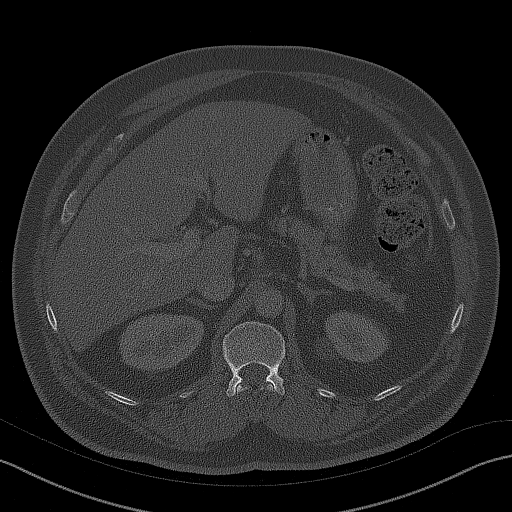

[Series 5: coronals · coronal · 0.82mm/px · 3 of 161 slices shown]
[im 33/161  mediastinal]
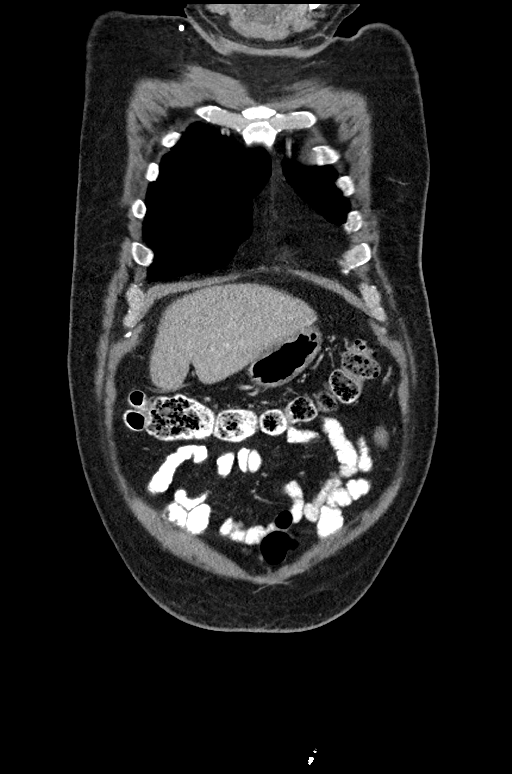
[im 65/161  mediastinal]
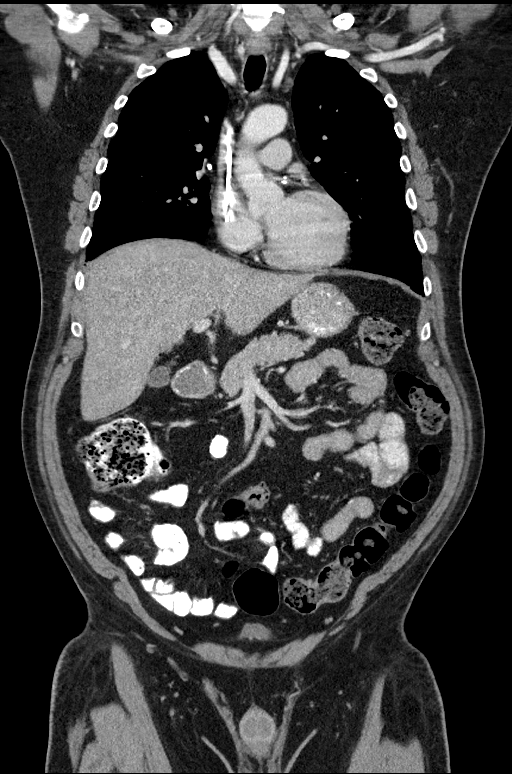
[im 97/161  mediastinal]
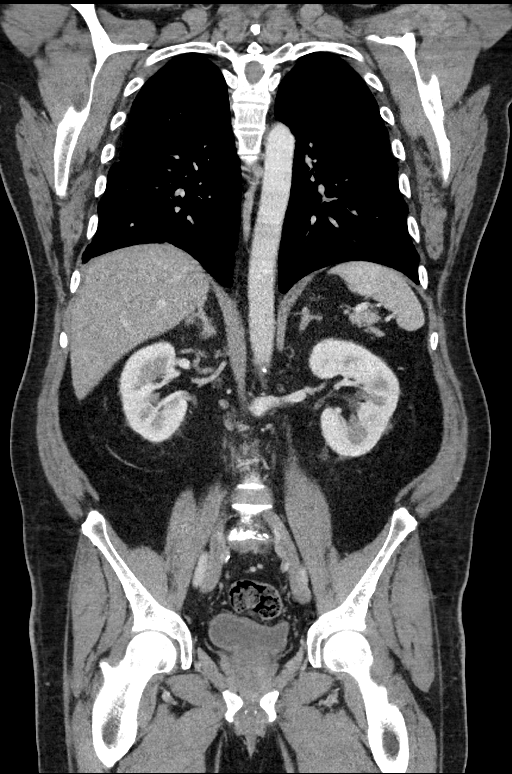

[14 of 36 positions shown; findings below may reference images not displayed]

FINDINGS: CT CHEST FINDINGS

Cardiovascular: Right chest port catheter. Aortic atherosclerosis.
Normal heart size. Three-vessel coronary artery calcifications. No
pericardial effusion.

Mediastinum/Nodes: No enlarged mediastinal, hilar, or axillary lymph
nodes. Benign, calcified right hilar lymph. Thyroid gland, trachea,
and esophagus demonstrate no significant findings.

Lungs/Pleura: Mild, diffuse bilateral bronchial wall thickening.
Benign, calcified nodule of the right upper lobe. Stable, benign
fissural nodularity of the superior segment left lower lobe (series
4, image 53). No pleural effusion or pneumothorax.

Musculoskeletal: No chest wall mass or suspicious bone lesions
identified.

CT ABDOMEN PELVIS FINDINGS

Hepatobiliary: No solid liver abnormality is seen. Hepatic
steatosis. No gallstones, gallbladder wall thickening, or biliary
dilatation.

Pancreas: Unremarkable. No pancreatic ductal dilatation or
surrounding inflammatory changes.

Spleen: Normal in size without significant abnormality.

Adrenals/Urinary Tract: Adrenal glands are unremarkable. Kidneys are
normal, without renal calculi, solid lesion, or hydronephrosis.
Thickening of the decompressed urinary bladder, likely secondary to
chronic outlet obstruction.

Stomach/Bowel: Stomach is within normal limits. Status post partial
right hemicolectomy and ileocolic anastomosis. No evidence of bowel
wall thickening, distention, or inflammatory changes.

Vascular/Lymphatic: Aortic atherosclerosis. No enlarged abdominal or
pelvic lymph nodes.

Reproductive: Prostatomegaly.

Other: No abdominal wall hernia or abnormality. No abdominopelvic
ascites.

Musculoskeletal: No acute or significant osseous findings.
IMPRESSION: 1. Status post partial right hemicolectomy and ileocolic
anastomosis.
2. No evidence of recurrent or metastatic disease in the chest,
abdomen, or pelvis.
3. Mild, diffuse bilateral bronchial wall thickening, consistent
with nonspecific infectious or inflammatory bronchitis.
4. Hepatic steatosis.
5. Prostatomegaly. Thickening of the decompressed urinary bladder,
likely secondary to chronic outlet obstruction.
6. Coronary artery disease.

Aortic Atherosclerosis ([PK]-[PK]).

## 2021-09-24 MED ORDER — HEPARIN SOD (PORK) LOCK FLUSH 100 UNIT/ML IV SOLN: 500.0000 [IU] | Freq: Once | INTRAVENOUS | Status: AC

## 2021-09-24 MED ORDER — SODIUM CHLORIDE 0.9% FLUSH
10.0000 mL | INTRAVENOUS | Status: DC | PRN
  Administered 2021-09-24: 10 mL

## 2021-09-24 MED ORDER — IOHEXOL 350 MG/ML SOLN
100.0000 mL | Freq: Once | INTRAVENOUS | Status: AC | PRN
  Administered 2021-09-24: 80 mL via INTRAVENOUS

## 2021-09-24 MED ORDER — HEPARIN SOD (PORK) LOCK FLUSH 100 UNIT/ML IV SOLN
INTRAVENOUS | Status: AC
  Administered 2021-09-24: 500 [IU] via INTRAVENOUS
  Filled 2021-09-24: qty 5

## 2021-09-25 ENCOUNTER — Encounter: Payer: Self-pay | Admitting: Hematology

## 2021-09-25 NOTE — Telephone Encounter (Signed)
Open for chart review no changes made. 

## 2021-09-26 ENCOUNTER — Other Ambulatory Visit: Payer: Medicaid Other

## 2021-09-26 ENCOUNTER — Inpatient Hospital Stay: Payer: Medicare Other | Admitting: Hematology

## 2021-09-30 ENCOUNTER — Telehealth: Payer: Self-pay | Admitting: Hematology

## 2021-09-30 NOTE — Telephone Encounter (Signed)
Rescheduled per 10/24 in basket, message was left with pt via interpreter

## 2021-10-07 ENCOUNTER — Other Ambulatory Visit: Payer: Self-pay

## 2021-10-07 ENCOUNTER — Inpatient Hospital Stay (HOSPITAL_BASED_OUTPATIENT_CLINIC_OR_DEPARTMENT_OTHER): Payer: Medicare Other | Admitting: Hematology

## 2021-10-07 ENCOUNTER — Telehealth: Payer: Self-pay

## 2021-10-07 ENCOUNTER — Inpatient Hospital Stay: Payer: Medicare Other

## 2021-10-07 VITALS — BP 136/82 | HR 76 | Temp 98.1°F | Resp 18 | Ht 65.0 in | Wt 196.9 lb

## 2021-10-07 DIAGNOSIS — E119 Type 2 diabetes mellitus without complications: Secondary | ICD-10-CM | POA: Insufficient documentation

## 2021-10-07 DIAGNOSIS — Z9221 Personal history of antineoplastic chemotherapy: Secondary | ICD-10-CM | POA: Diagnosis not present

## 2021-10-07 DIAGNOSIS — C8200 Follicular lymphoma grade I, unspecified site: Secondary | ICD-10-CM | POA: Diagnosis not present

## 2021-10-07 DIAGNOSIS — I700 Atherosclerosis of aorta: Secondary | ICD-10-CM | POA: Diagnosis not present

## 2021-10-07 DIAGNOSIS — C182 Malignant neoplasm of ascending colon: Secondary | ICD-10-CM

## 2021-10-07 DIAGNOSIS — E7800 Pure hypercholesterolemia, unspecified: Secondary | ICD-10-CM | POA: Diagnosis not present

## 2021-10-07 DIAGNOSIS — Z85038 Personal history of other malignant neoplasm of large intestine: Secondary | ICD-10-CM | POA: Diagnosis present

## 2021-10-07 DIAGNOSIS — D509 Iron deficiency anemia, unspecified: Secondary | ICD-10-CM | POA: Insufficient documentation

## 2021-10-07 NOTE — Telephone Encounter (Signed)
Called pt but got his voicemail.  Left voicemail stating Port Flush w/Lab appt is canceled for today since he had labs drawn 2 wks ago per Dr. Ernestina Penna request.  Instructed pt to please give [place] a call at [phone] should he have questions.

## 2021-10-07 NOTE — Progress Notes (Addendum)
[place]   [phone] [phone]) [phone]   Clinic Follow up Note   Patient Care Team: Blake Patience, DO as PCP - General (Family Medicine) Blake Roup, MD as Consulting Physician (Colon and Rectal Surgery) Blake [name], MD as Consulting Physician (Hematology)  Date of Service:  10/07/2021  CHIEF COMPLAINT: f/u of colon cancer  CURRENT THERAPY:  Surveillance  ASSESSMENT & PLAN:  Blake Vaughn is a 60 y.o. male with   1. Right Cecal adenocarcinoma, pT4bN1aM1c, Stage IVC, with limited peritoneal metastasis, resected, Grade II-III, MMR normal  -Diagnosed in 10/2018. Treated with right hemicolectomy. He was found to have 2 small peritoneal metastasis during the surgery, which were removed.   -He completed 12 cycles of adjuvant FOLFOX. Now on surveillance. -CT A/P on 09/07/20 was negative -He underwent endo/colonoscopy on 05/03/21 under Dr. Cristina Vaughn at [facility]. Pathology showed a tubular adenoma in the transverse colon, otherwise negative. Repeat colonoscopy recommended in 3 years, repeat endoscopy not necessary. -CT CAP on 09/24/21 was NED, I reviewed the images and discussed with him  -He is clinically doing well. Labs from 09/24/21 reviewed, his sugar remains uncontrolled. -We will continue cancer surveillance. He can have his port removed at this point.   2. H/o Follicular Lymphoma  -Was treated with CHOP/Rituxan in 2010. Then he was previously treated with Maintenance Rituxan completed in 2012. Last f/u was in 2017.   3. DM and Hypercholesterolemia, uncontrolled hyperglycemia   -On metformin, Glimepiride, Lipitor and metoprolol.  -His CT AP from 09/02/19 also shows aortic atherosclerosis, which can increase his risk for MI.  -He does use Prednisone before CT scans. He is also to hold metformin 2 days before scan. I advised him to closely watch his BG at home.    5. 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   SUMMARY OF ONCOLOGIC HISTORY: Oncology History Overview Note  Cancer Staging Cancer of right colon [facility]) Staging form: Colon and Rectum, AJCC 8th Edition - Pathologic stage from 10/21/2018: Stage IVC (pT4b, pN1a, pM1c) - Signed by Blake [name], MD on 11/13/2018 Total positive nodes: 1 Histologic grading system: 4 grade system Histologic grade (G): G2 Residual tumor (R): R0 - None Sites of metastasis: Peritoneum Lymph-vascular invasion (LVI): LVI present/identified, NOS  Follicular lymphoma ([place]), History of Staging form: Lymphoid Neoplasms, AJCC 6th Edition - Clinical: Stage II - Signed by Blake Bears, MD on 5/63/8756     Follicular lymphoma [facility]), History of  11/2009 Initial Diagnosis   Follicular lymphoma ([place]), History of    Chemotherapy   1) Status post 6 cycles of systemic chemotherapy with CHOP/Rituxan last dose given 05/01/2009.  2) Maintenance Rituxan at 375 mg per meter square given every 2 months status post 12 cycles    Cancer of right colon ([place])  10/21/2018 Surgery   Exploratory laparotomy right hemicolectomy by Dr. Dema Vaughn and Dr. Ninfa Vaughn  10/21/18   10/21/2018 Pathology Results   Diagnosis 10/21/18 Colon, segmental resection for tumor, right ascending and appendix - ADENOCARCINOMA, MODERATE TO POORLY DIFFERENTIATED (4 CM) - METASTATIC CARCINOMA INVOLVING ONE OF EIGHTEEN LYMPH NODES (1/18) - CARCINOMA EXTENDS INTO THE APPENDIX - TWO TUMOR DEPOSITS PRESENT - SEE ONCOLOGY TABLE AND COMMENT BELOW   10/21/2018 Cancer Staging   Staging  form: Colon and Rectum, AJCC 8th Edition - Pathologic stage from 10/21/2018: Stage IVC (pT4b, pN1a, pM1c) - Signed by Blake [name], MD on 11/13/2018    11/04/2018 Imaging   CT AP W Contrast 11/04/18  IMPRESSION: 1. Interval appendectomy. There is  residual soft tissue thickening and stranding in the right lower quadrant adjacent to the cecum which may represent ongoing inflammation versus postsurgical changes. A small soft tissue density adjacent to the surgical sutures may reflect small hematoma or operative collection. No large focal fluid collection to suggest drainable abscess allowing for absence of contrast 2. Fluid-filled colon without wall thickening, could reflect diarrheal process   11/08/2018 Imaging   CT Chest W Contrast 11/08/18  IMPRESSION: 1. No acute findings are noted in the thorax to account for the patient's symptoms. 2. Aortic atherosclerosis, in addition to left main and 3 vessel coronary artery disease. Please note that although the presence of coronary artery calcium documents the presence of coronary artery disease, the severity of this disease and any potential stenosis cannot be assessed on this non-gated CT examination. Assessment for potential risk factor modification, dietary therapy or pharmacologic therapy may be warranted, if clinically indicated. 3. Additional incidental findings, as above. Aortic Atherosclerosis (ICD10-I70.0).   11/12/2018 Initial Diagnosis   Cancer of right colon ([place])   11/25/2018 - 05/05/2019 Chemotherapy   FOLFOX q2weeks starting 11/25/18. Due to moderate thrombocytopenia, I will stop 5-FU bolus, and reduce pump infusion to 2213m/m2 starting with cycle 7.  Plan for last treatment on 05/05/19.    02/10/2019 Imaging   CT AP W Contrast   IMPRESSION: 1. No evidence metastatic disease. 2. Hepatic steatosis. 3. Mildly enlarged prostate. 4. Aortic atherosclerosis (ICD10-170.0). coronary artery calcification.   09/02/2019 Imaging   CT AP W Contrast IMPRESSION: 1. No signs to suggest metastatic disease in the abdomen or pelvis. 2. Aortic atherosclerosis these, as well as right coronary artery disease.   03/01/2020 Imaging   CT CAP W Contrast  IMPRESSION: 1. Stable exam.  No new or progressive findings to suggest recurrent or metastatic disease. 2. Status post right hemicolectomy. 3. Aortic Atherosclerosis (ICD10-I70.0).   09/07/2020 Imaging   CT AP  IMPRESSION: 1. Status post right hemicolectomy, without recurrent or metastatic disease. 2. Coronary artery atherosclerosis. Aortic Atherosclerosis (ICD10-I70.0).   09/24/2021 Imaging   CT CAP  IMPRESSION: 1. Status post partial right hemicolectomy and ileocolic anastomosis. 2. No evidence of recurrent or metastatic disease in the chest, abdomen, or pelvis. 3. Mild, diffuse bilateral bronchial wall thickening, consistent with nonspecific infectious or inflammatory bronchitis. 4. Hepatic steatosis. 5. Prostatomegaly. Thickening of the decompressed urinary bladder, likely secondary to chronic outlet obstruction. 6. Coronary artery disease.   Aortic Atherosclerosis (ICD10-I70.0).      INTERVAL HISTORY:  Blake JAKEis here for a follow up of colon cancer. He was last seen by me on 05/09/21. He presents to the clinic accompanied by our interpreter JAlmyra Free He reports he still has some residual neuropathy in his fingers.   All other systems were reviewed with the patient and are negative.  MEDICAL HISTORY:  Past Medical History:  Diagnosis Date   Appendicitis 10/21/2018   Diabetes mellitus    [place]licular 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   Qualifier: Diagnosis of  By: KDeatra InaMD, RSandy Salaam   GI bleed 11/05/2018   Hypercholesterolemia    Hypertension 11/27/2011   [place]ymphoma ([place]    gets annual chemo last tx Feb 2013   TOBACCO USE, QUIT 08/18/2009  Qualifier: Diagnosis of  By: Drue Flirt  MD, Merrily Brittle      SURGICAL HISTORY: Past Surgical History:  Procedure Laterality Date   ESOPHAGOGASTRODUODENOSCOPY (EGD) WITH PROPOFOL N/A 11/05/2018   Procedure: ESOPHAGOGASTRODUODENOSCOPY (EGD) WITH PROPOFOL;  Surgeon: [name], MD;  Location: [facility] ENDOSCOPY;  Service: Endoscopy;  Laterality: N/A;    IR IMAGING GUIDED PORT INSERTION  11/24/2018   LAPAROSCOPIC APPENDECTOMY N/A 10/21/2018   Procedure: Exploratory laparotomy right hemicolectomy;  Surgeon: Blake Roup, MD;  Location: [facility];  Service: General;  Laterality: N/A;    I have reviewed the social history and family history with the patient and they are unchanged from previous note.  ALLERGIES:  is allergic to iohexol.  MEDICATIONS:  Current Outpatient Medications  Medication Sig Dispense Refill   Accu-Chek Softclix Lancets lancets Use as instructed 100 each 12   Blood Glucose Monitoring Suppl (ACCU-CHEK AVIVA PLUS) w/Device KIT Inject 1 kit into the skin 2 (two) times daily. 1 kit 0   diphenhydrAMINE (BENADRYL) 50 MG tablet Take 1 tablet (50 mg total) by mouth once for 1 dose. Take 1 tablet 1 hour before CT scan 1 tablet 0   glipiZIDE (GLUCOTROL XL) 10 MG 24 hr tablet TAKE 1 TABLET BY MOUTH ONCE DAILY BREAKFAST. 90 tablet 0   glucose blood (ACCU-CHEK AVIVA PLUS) test strip Use as instructed 100 each 12   lidocaine-prilocaine (EMLA) cream Apply 1 application topically as needed. 30 g 1   metFORMIN (GLUCOPHAGE) 1000 MG tablet Take 1 tablet (1,000 mg total) by mouth 2 (two) times daily with a meal. 180 tablet 3   sitaGLIPtin (JANUVIA) 50 MG tablet Take 1 tablet (50 mg total) by mouth daily. 90 tablet 3   No current facility-administered medications for this visit.    PHYSICAL EXAMINATION: ECOG PERFORMANCE STATUS: 0 - Asymptomatic  Vitals:   10/07/21 1129  BP: 136/82  Pulse: 76  Resp: 18  Temp: 98.1 F (36.7 C)  SpO2: 100%   Wt Readings from Last 3 Encounters:  10/07/21 196 lb 14.4 oz (89.3 kg)  05/09/21 203 lb 4.8 oz (92.2 kg)  05/07/21 204 lb 3.2 oz (92.6 kg)     GENERAL:alert, no distress and comfortable SKIN: skin color normal, no rashes or significant lesions EYES: normal, Conjunctiva are pink and non-injected, sclera clear  NEURO: alert & oriented x 3 with fluent speech  LABORATORY DATA:  I have  reviewed the data as listed CBC Latest Ref Rng & Units 09/24/2021 08/01/2021 05/30/2021  WBC 4.0 - 10.5 K/uL 11.1(H) 8.0 8.2  Hemoglobin 13.0 - 17.0 g/dL 14.2 14.9 13.9  Hematocrit 39.0 - 52.0 % 40.1 41.9 39.7  Platelets 150 - 400 K/uL 229 219 174     CMP Latest Ref Rng & Units 09/24/2021 08/01/2021 05/30/2021  Glucose 70 - 99 mg/dL 396(H) 329(H) 224(H)  BUN 6 - 20 mg/dL 19 21(H) 19  Creatinine 0.61 - 1.24 mg/dL 1.14 1.40(H) 0.97  Sodium 135 - 145 mmol/L 137 133(L) 138  Potassium 3.5 - 5.1 mmol/L 4.6 5.0 4.7  Chloride 98 - 111 mmol/L 103 100 105  CO2 22 - 32 mmol/L 21(L) 24 24  Calcium 8.9 - 10.3 mg/dL 9.6 9.5 9.5  Total Protein 6.5 - 8.1 g/dL 7.2 6.7 6.5  Total Bilirubin 0.3 - 1.2 mg/dL 0.6 0.7 0.6  Alkaline Phos 38 - 126 U/L 77 83 65  AST 15 - 41 U/L '16 17 19  ' ALT 0 - 44 U/L 20 22 21  RADIOGRAPHIC STUDIES: I have personally reviewed the radiological images as listed and agreed with the findings in the report. No results found.    Orders Placed This Encounter  Procedures   IR REMOVAL TUN ACCESS W/ PORT W/O FL MOD SED    Standing Status:   Future    Standing Expiration Date:   10/07/2022    Order Specific Question:   Reason for Exam (SYMPTOM  OR DIAGNOSIS REQUIRED)    Answer:   no needed anymore    Order Specific Question:   Preferred Imaging Location?    Answer:   [facility]    All questions were answered. The patient knows to call the clinic with any problems, questions or concerns. No barriers to learning was detected. The total time spent in the appointment was 30 minutes.     Blake [name], MD 10/07/2021   I, Wilburn Mylar, am acting as scribe for Blake [name], MD.   I have reviewed the above documentation for accuracy and completeness, and I agree with the above.

## 2021-10-09 ENCOUNTER — Other Ambulatory Visit: Payer: Self-pay

## 2021-10-09 ENCOUNTER — Emergency Department (HOSPITAL_COMMUNITY): Payer: Medicare Other

## 2021-10-09 ENCOUNTER — Encounter (HOSPITAL_COMMUNITY): Payer: Self-pay | Admitting: *Deleted

## 2021-10-09 ENCOUNTER — Emergency Department (HOSPITAL_COMMUNITY)
Admission: EM | Admit: 2021-10-09 | Discharge: 2021-10-09 | Disposition: A | Discharge status: Home / Self Care | Payer: Medicare Other | Attending: Student | Admitting: Student

## 2021-10-09 DIAGNOSIS — Z20822 Contact with and (suspected) exposure to covid-19: Secondary | ICD-10-CM | POA: Diagnosis not present

## 2021-10-09 DIAGNOSIS — Z8673 Personal history of transient ischemic attack (TIA), and cerebral infarction without residual deficits: Secondary | ICD-10-CM | POA: Insufficient documentation

## 2021-10-09 DIAGNOSIS — F419 Anxiety disorder, unspecified: Secondary | ICD-10-CM | POA: Diagnosis not present

## 2021-10-09 DIAGNOSIS — I10 Essential (primary) hypertension: Secondary | ICD-10-CM | POA: Diagnosis not present

## 2021-10-09 DIAGNOSIS — R519 Headache, unspecified: Secondary | ICD-10-CM | POA: Diagnosis not present

## 2021-10-09 DIAGNOSIS — Z7984 Long term (current) use of oral hypoglycemic drugs: Secondary | ICD-10-CM | POA: Diagnosis not present

## 2021-10-09 DIAGNOSIS — R930 Abnormal findings on diagnostic imaging of skull and head, not elsewhere classified: Secondary | ICD-10-CM | POA: Diagnosis not present

## 2021-10-09 DIAGNOSIS — E119 Type 2 diabetes mellitus without complications: Secondary | ICD-10-CM | POA: Insufficient documentation

## 2021-10-09 DIAGNOSIS — Z85038 Personal history of other malignant neoplasm of large intestine: Secondary | ICD-10-CM | POA: Diagnosis not present

## 2021-10-09 DIAGNOSIS — R002 Palpitations: Secondary | ICD-10-CM | POA: Insufficient documentation

## 2021-10-09 DIAGNOSIS — Z87891 Personal history of nicotine dependence: Secondary | ICD-10-CM | POA: Diagnosis not present

## 2021-10-09 LAB — CBC WITH DIFFERENTIAL/PLATELET
Abs Immature Granulocytes: 0.03 10*3/uL (ref 0.00–0.07)
Basophils Absolute: 0 10*3/uL (ref 0.0–0.1)
Basophils Relative: 0 %
Eosinophils Absolute: 0.3 10*3/uL (ref 0.0–0.5)
Eosinophils Relative: 3 %
HCT: 38.4 % — ABNORMAL LOW (ref 39.0–52.0)
Hemoglobin: 13.8 g/dL (ref 13.0–17.0)
Immature Granulocytes: 0 %
Lymphocytes Relative: 24 %
Lymphs Abs: 2.4 10*3/uL (ref 0.7–4.0)
MCH: 31.3 pg (ref 26.0–34.0)
MCHC: 35.9 g/dL (ref 30.0–36.0)
MCV: 87.1 fL (ref 80.0–100.0)
Monocytes Absolute: 1.1 10*3/uL — ABNORMAL HIGH (ref 0.1–1.0)
Monocytes Relative: 11 %
Neutro Abs: 6 10*3/uL (ref 1.7–7.7)
Neutrophils Relative %: 62 %
Platelets: 236 10*3/uL (ref 150–400)
RBC: 4.41 MIL/uL (ref 4.22–5.81)
RDW: 12.7 % (ref 11.5–15.5)
WBC: 9.9 10*3/uL (ref 4.0–10.5)
nRBC: 0 % (ref 0.0–0.2)

## 2021-10-09 LAB — COMPREHENSIVE METABOLIC PANEL
ALT: 20 U/L (ref 0–44)
AST: 22 U/L (ref 15–41)
Albumin: 3.9 g/dL (ref 3.5–5.0)
Alkaline Phosphatase: 54 U/L (ref 38–126)
Anion gap: 10 (ref 5–15)
BUN: 21 mg/dL — ABNORMAL HIGH (ref 6–20)
CO2: 20 mmol/L — ABNORMAL LOW (ref 22–32)
Calcium: 9.4 mg/dL (ref 8.9–10.3)
Chloride: 101 mmol/L (ref 98–111)
Creatinine, Ser: 1.26 mg/dL — ABNORMAL HIGH (ref 0.61–1.24)
GFR, Estimated: >60 mL/min (ref >60)
Glucose, Bld: 96 mg/dL (ref 70–99)
Potassium: 3.8 mmol/L (ref 3.5–5.1)
Sodium: 131 mmol/L — ABNORMAL LOW (ref 135–145)
Total Bilirubin: 0.7 mg/dL (ref 0.3–1.2)
Total Protein: 6.4 g/dL — ABNORMAL LOW (ref 6.5–8.1)

## 2021-10-09 LAB — RESP PANEL BY RT-PCR (FLU A&B, COVID) ARPGX2
Influenza A by PCR: NEGATIVE
Influenza B by PCR: NEGATIVE
SARS Coronavirus 2 by RT PCR: NEGATIVE

## 2021-10-09 LAB — CBG MONITORING, ED: Glucose-Capillary: 111 mg/dL — ABNORMAL HIGH (ref 70–99)

## 2021-10-09 LAB — TROPONIN I (HIGH SENSITIVITY): Troponin I (High Sensitivity): 9 ng/L (ref <18)

## 2021-10-09 LAB — LIPASE, BLOOD: Lipase: 45 U/L (ref 11–51)

## 2021-10-09 IMAGING — CR DG CHEST 2V
2 series · 2 of 2 positions shown · non-contrast
Comparison: Chest x-ray [DATE].

CLINICAL DATA: Palpitations.

EXAM:
CHEST - 2 VIEW

[chest pa]
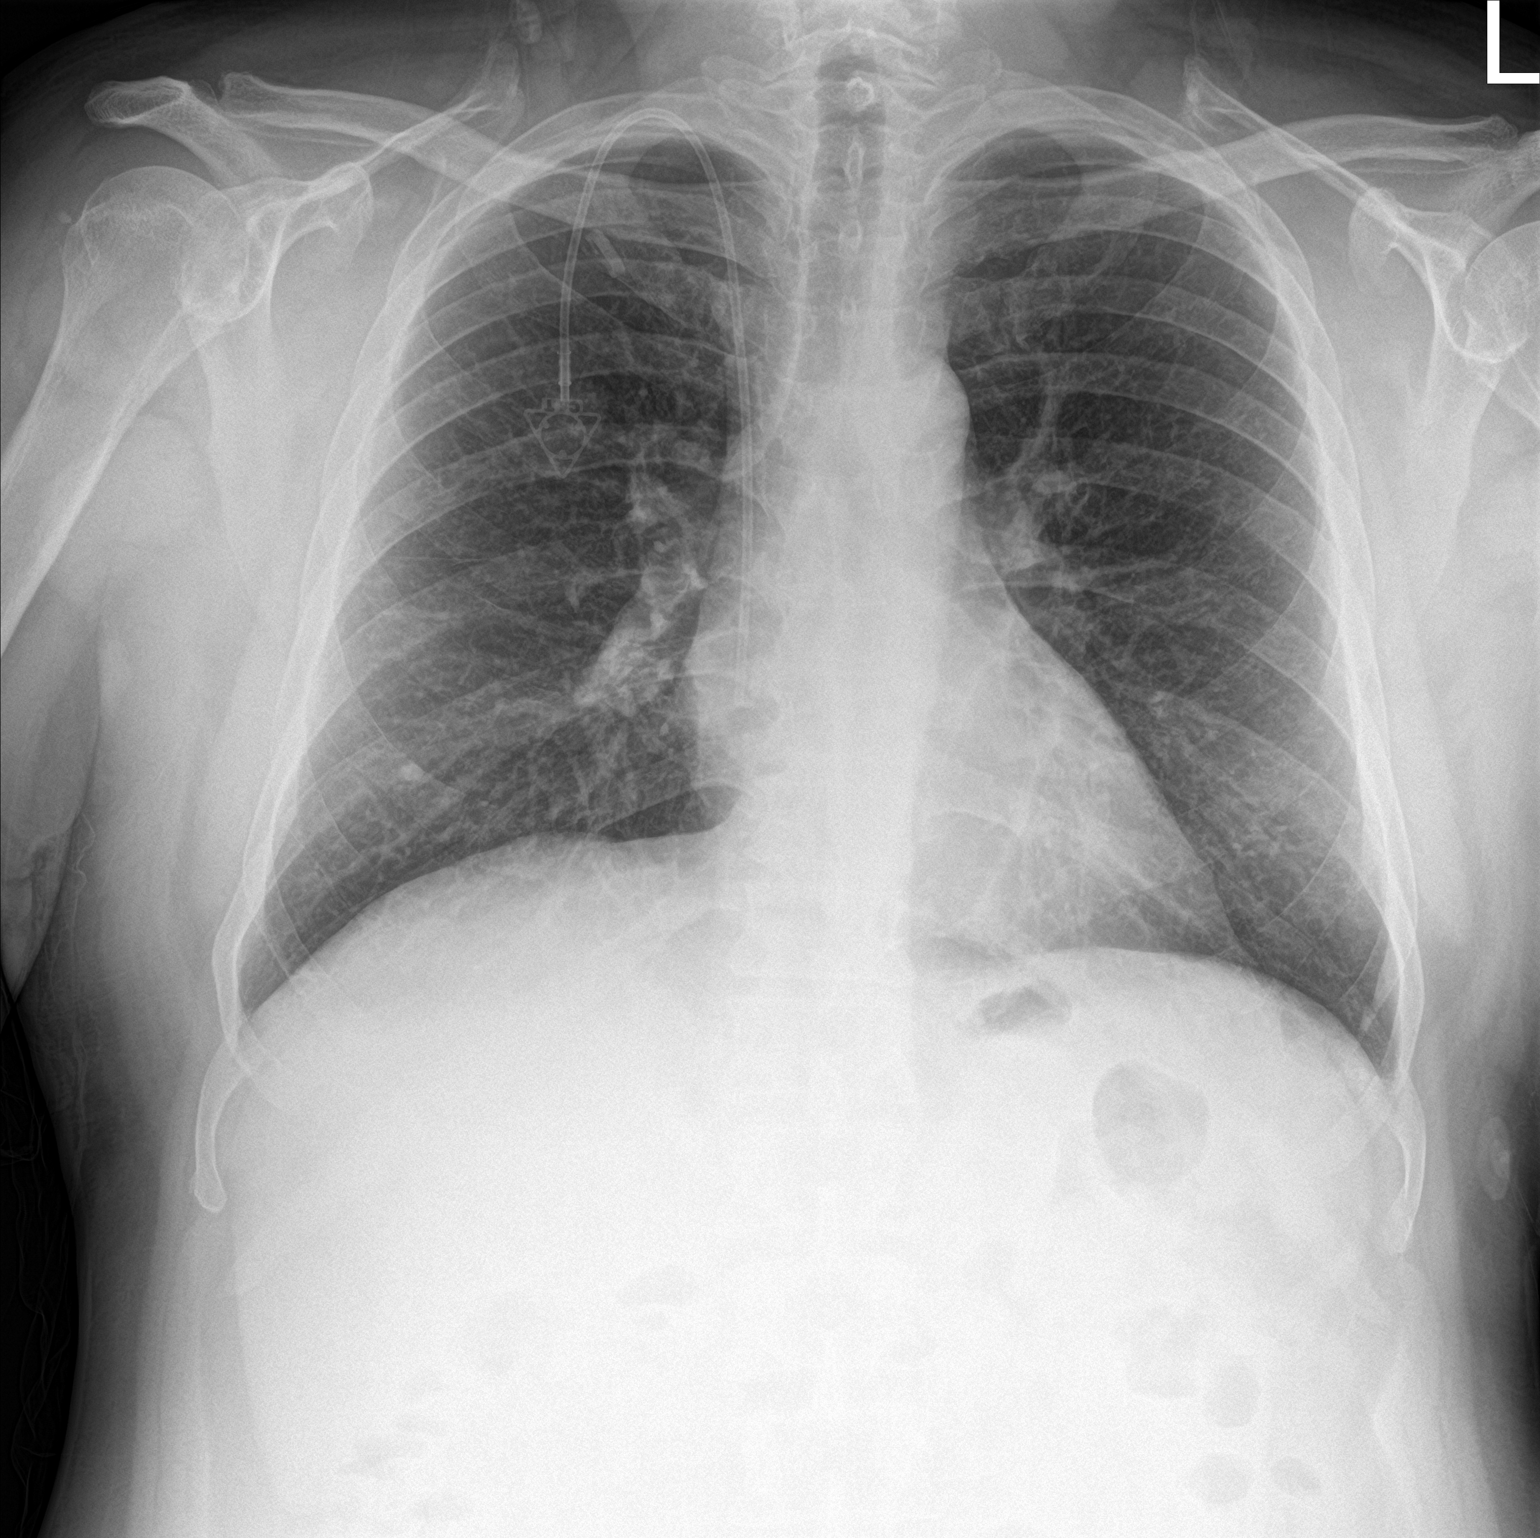

[chest lat]
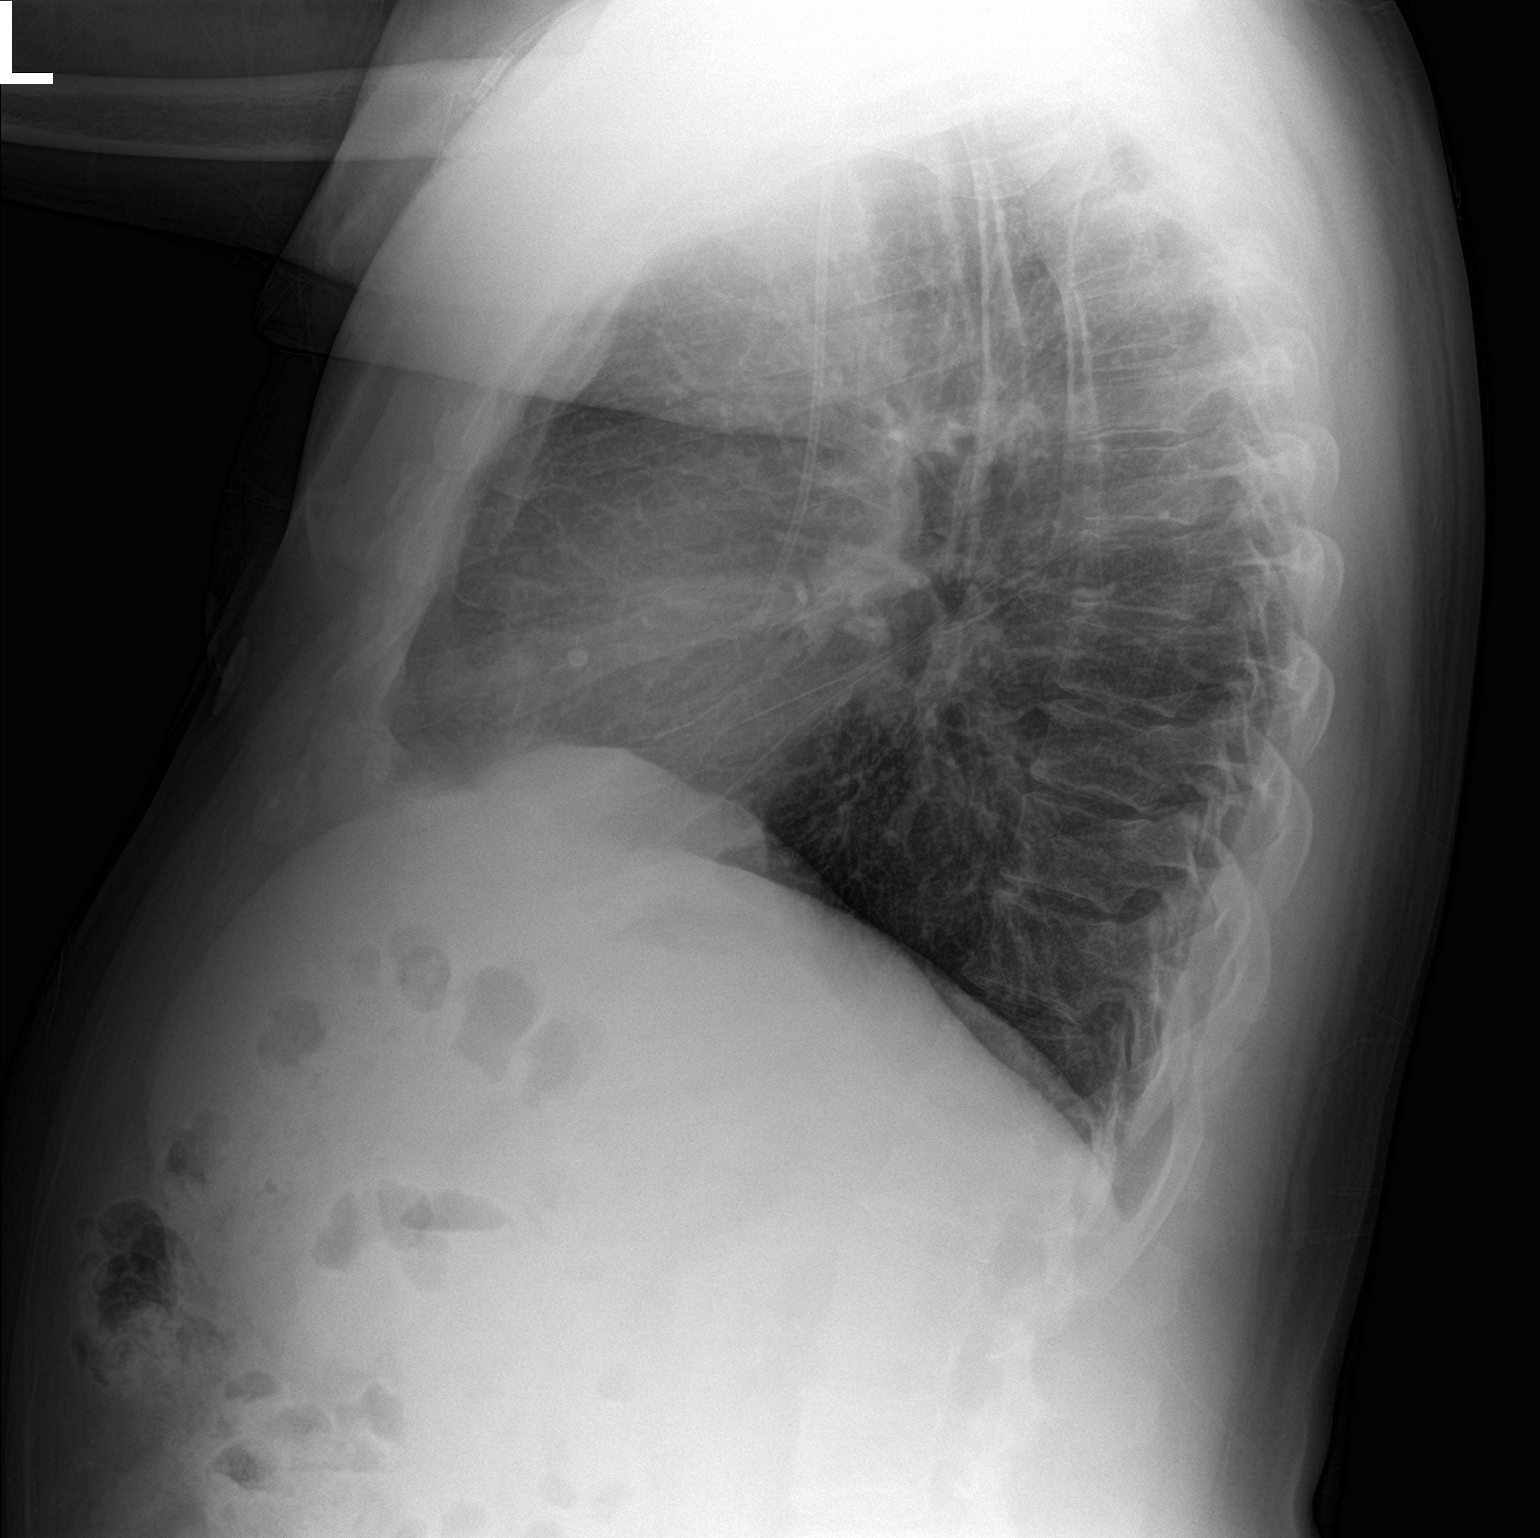

[2 of 2 positions shown; findings below may reference images not displayed]

FINDINGS: Right chest port catheter PICC tip projects over the caval atrial
junction. The heart size and mediastinal contours are within normal
limits. There is a stable calcified nodule in the right lower lung.
Lungs otherwise appear clear. The visualized skeletal structures are
unremarkable.
IMPRESSION: No active cardiopulmonary disease.

## 2021-10-09 IMAGING — MR MR HEAD W/O CM
6 of 9 series · 29 of 48 positions shown · non-contrast
Comparison: Head CT [DATE]

CLINICAL DATA: Palpitations and abnormal head CT

EXAM:
MRI HEAD WITHOUT CONTRAST
TECHNIQUE: Multiplanar, multiecho pulse sequences of the brain and surrounding
structures were obtained without intravenous contrast.

[Series 2: DWI · axial · 3.0mm · 0.94mm/px · z∈[-70,+80]mm · 8 of 101 slices shown (1 of 2)]
[im 1/101]
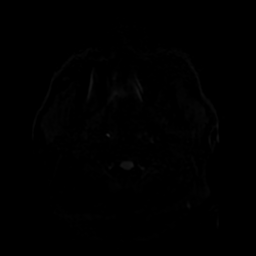
[im 12/101]
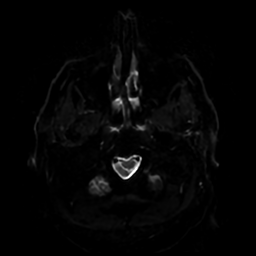
[im 34/101]
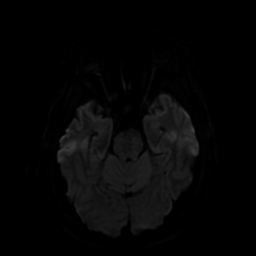
[im 45/101]
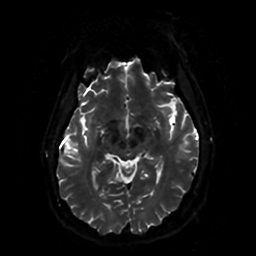
[im 56/101]
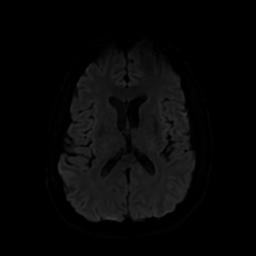
[im 67/101]
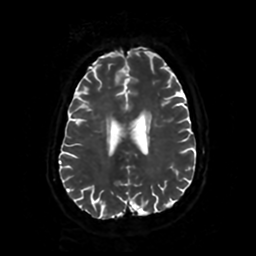
[im 89/101]
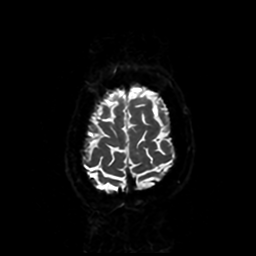
[im 101/101]
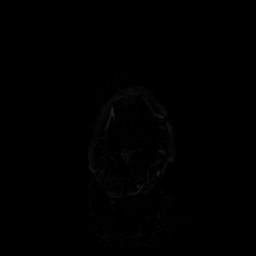

[Series 3: DWI · coronal · 4.0mm · 0.94mm/px · 7 of 74 slices shown (2 of 2)]
[im 1/74]
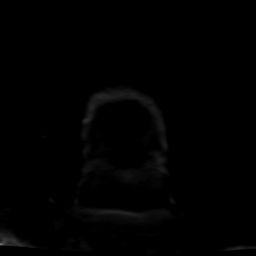
[im 13/74]
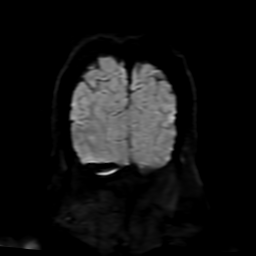
[im 25/74]
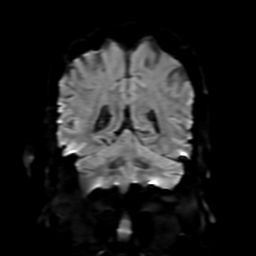
[im 37/74]
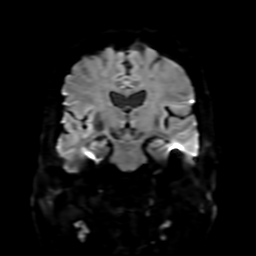
[im 49/74]
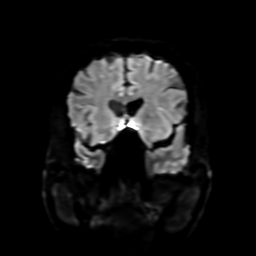
[im 61/74]
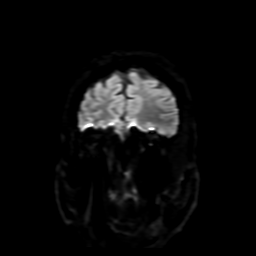
[im 74/74]
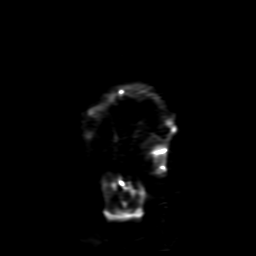

[Series 4: FLAIR · sagittal · 5.0mm · 0.23mm/px · 2 of 25 slices shown (1 of 2)]
[im 1/25]
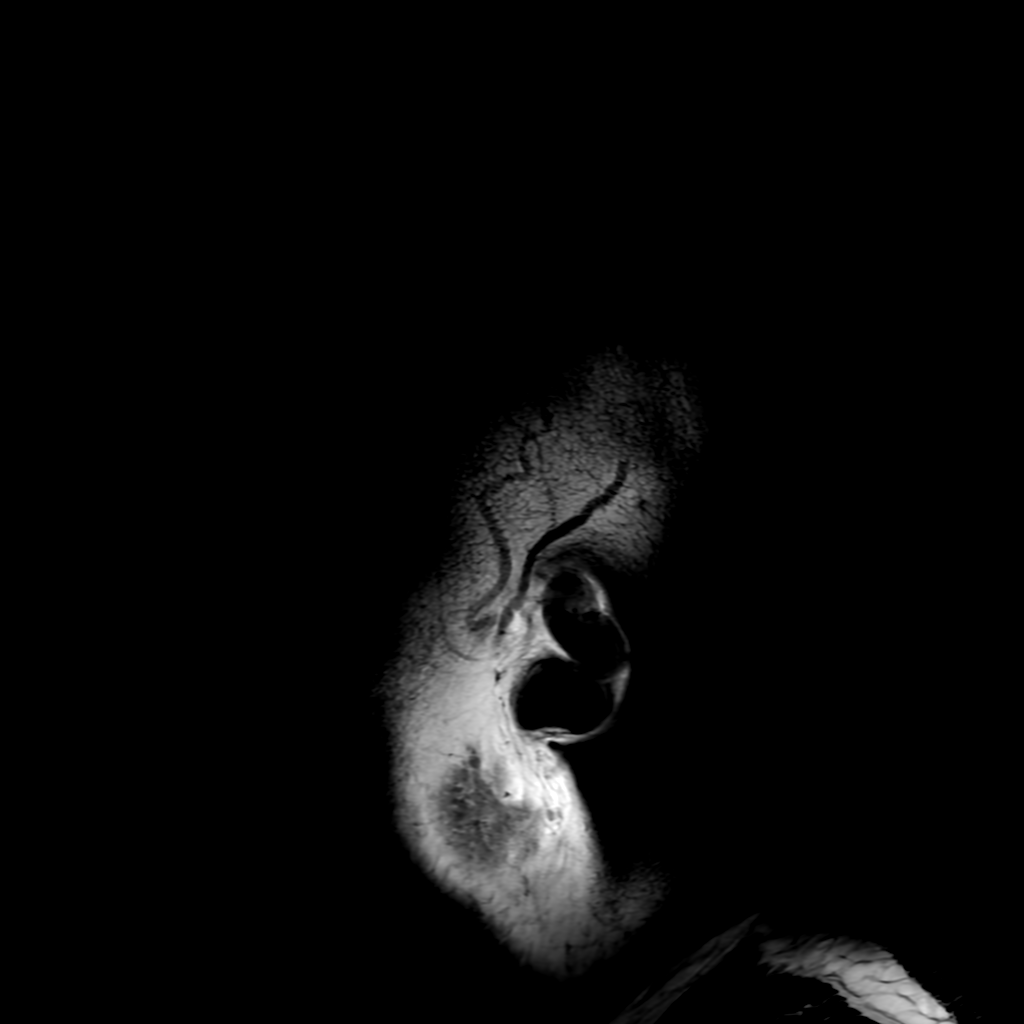
[im 25/25]
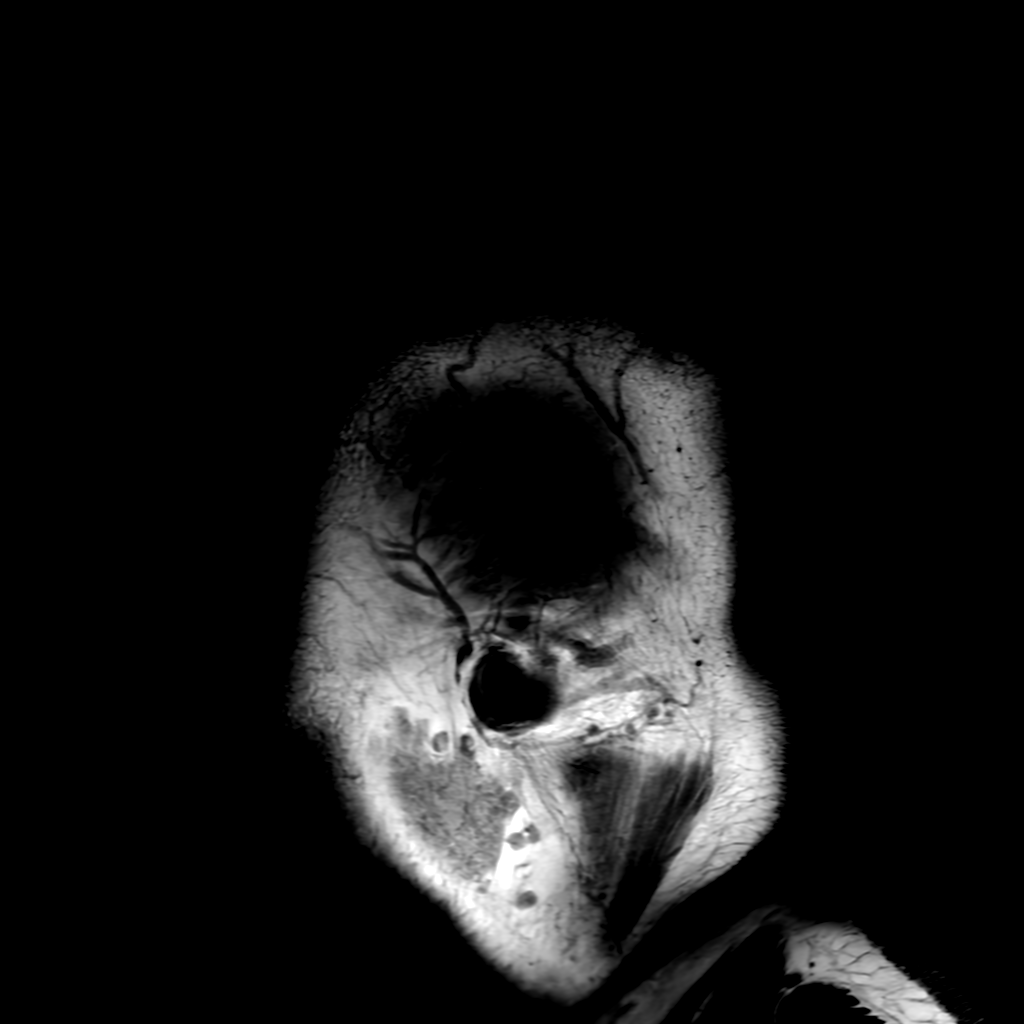

[Series 6: FLAIR · axial · 4.0mm · 0.45mm/px · z∈[-69,+81]mm · 3 of 35 slices shown (2 of 2)]
[im 1/35]
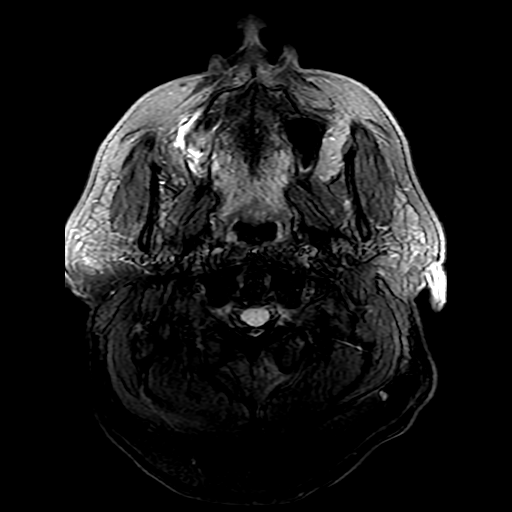
[im 18/35]
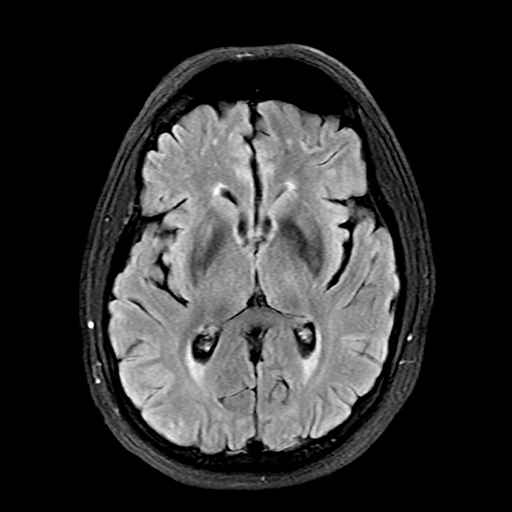
[im 35/35]
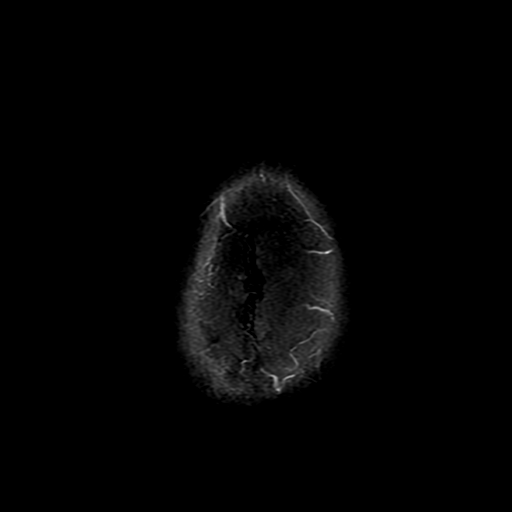

[Series 250: ADC · axial · 3.0mm · 0.94mm/px · z∈[-70,+80]mm · 5 of 51 slices shown (1 of 2)]
[im 1/51]
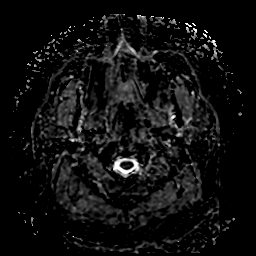
[im 13/51]
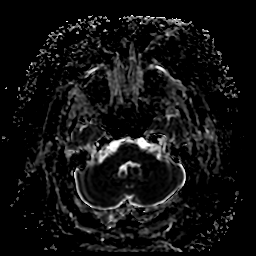
[im 26/51]
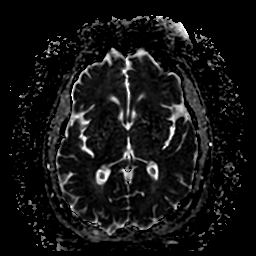
[im 38/51]
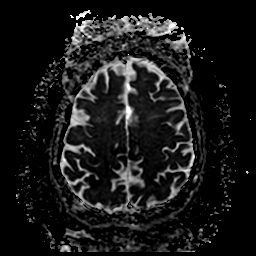
[im 51/51]
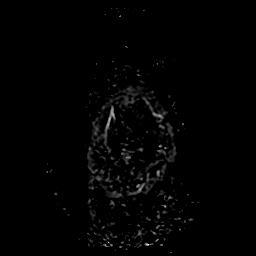

[Series 350: ADC · coronal · 4.0mm · 0.94mm/px · 4 of 37 slices shown (2 of 2)]
[im 1/37]
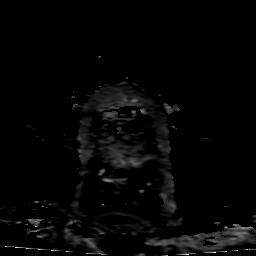
[im 13/37]
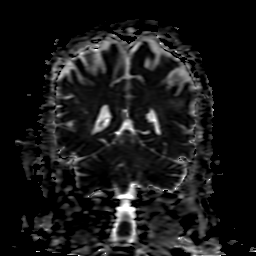
[im 25/37]
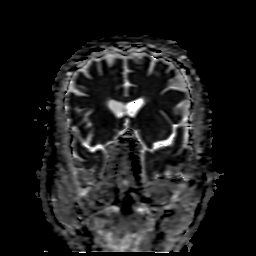
[im 37/37]
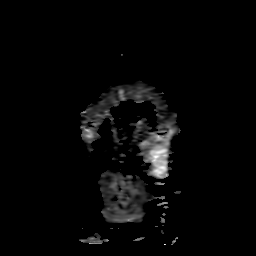

[29 of 48 positions shown; findings below may reference images not displayed]

FINDINGS: Brain: No acute infarct, mass effect or extra-axial collection. No
acute or chronic hemorrhage. There is multifocal hyperintense
T2-weighted signal within the white matter. Generalized volume loss
without a clear lobar predilection. Multiple old subcortical
infarcts noted in the left parietal, right occipital and right
frontal lobes. This includes the area of hypodensity described on
the earlier head CT. The midline structures are normal.

Vascular: Major flow voids are preserved.

Skull and upper cervical spine: Normal calvarium and skull base.
Visualized upper cervical spine and soft tissues are normal.

Sinuses/Orbits:No paranasal sinus fluid levels or advanced mucosal
thickening. No mastoid or middle ear effusion. Normal orbits.
IMPRESSION: 1. No acute intracranial abnormality.
2. Multiple old subcortical infarcts in the left parietal, right
occipital and right frontal lobes. This includes the area of
hypodensity described on the earlier head CT.

## 2021-10-09 IMAGING — CT CT HEAD W/O CM
4 series · 16 of 47 positions shown, 18 images · non-contrast
Comparison: CT head [DATE]

CLINICAL DATA: Headache

EXAM:
CT HEAD WITHOUT CONTRAST
TECHNIQUE: Contiguous axial images were obtained from the base of the skull
through the vertex without intravenous contrast.

[Series 3: head wo · axial · 0.39mm/px · z∈[+1176,+1286]mm · 7 of 30 slices shown, 9 images]
[im 4/30  brain]
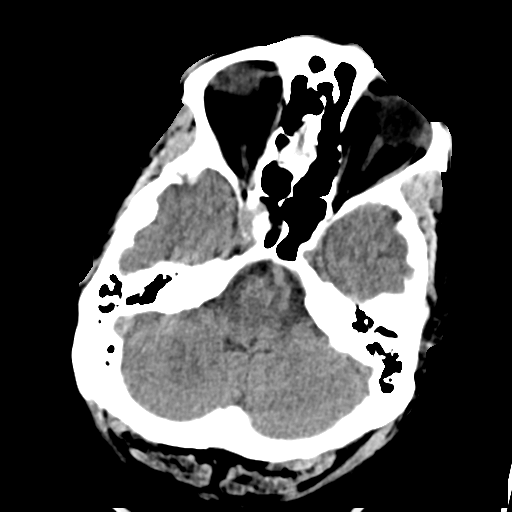
[im 4/30  bone]
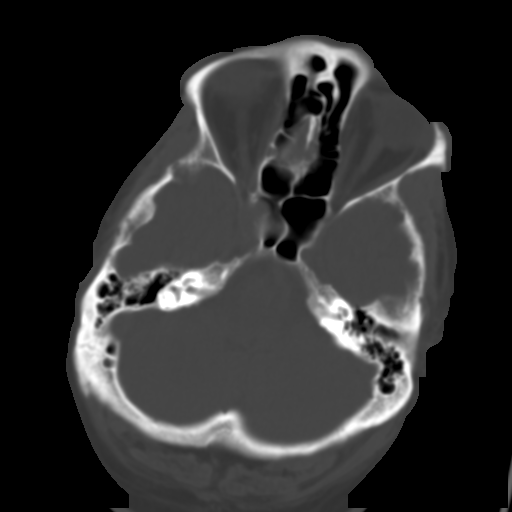
[im 8/30  brain]
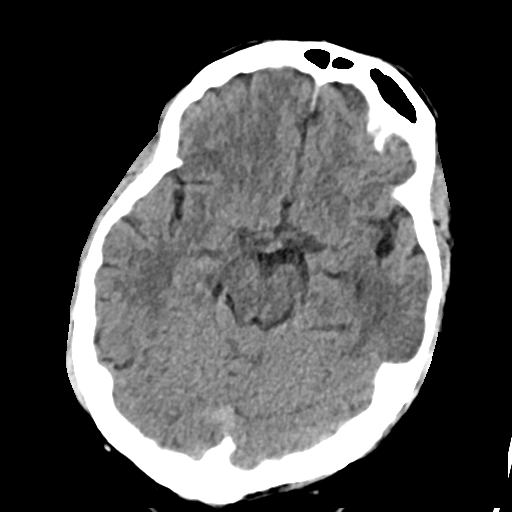
[im 11/30  brain]
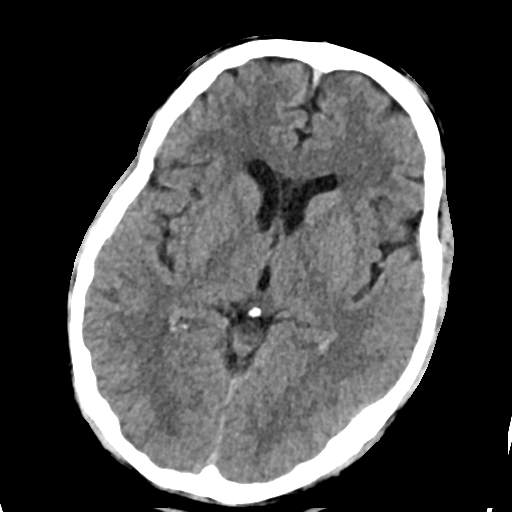
[im 15/30  brain]
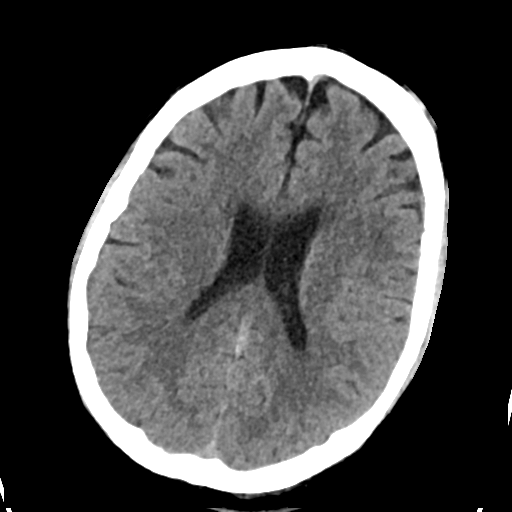
[im 19/30  brain]
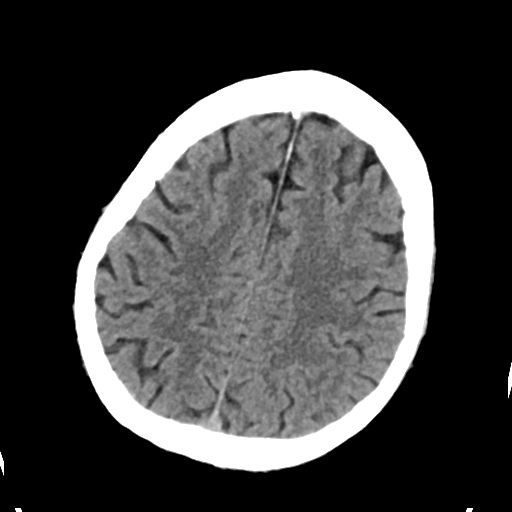
[im 19/30  bone]
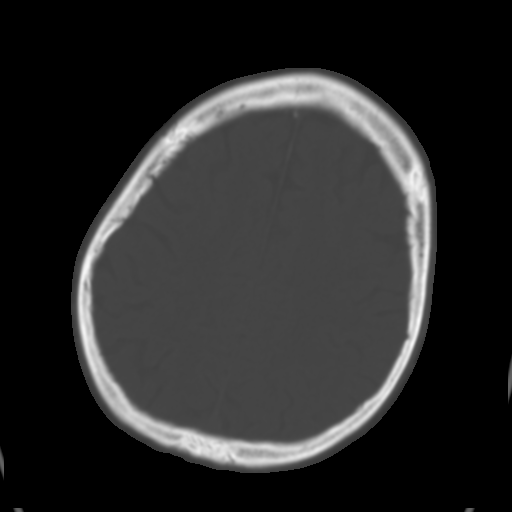
[im 22/30  brain]
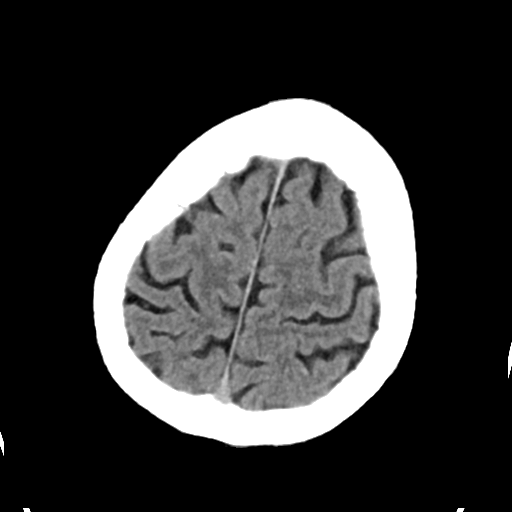
[im 26/30  brain]
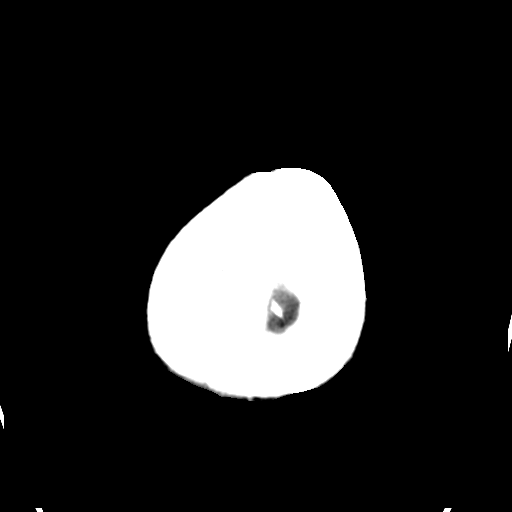

[Series 4: head bone · axial · 0.39mm/px · z∈[+1176,+1204]mm · 3 of 74 slices shown]
[im 8/74  bone]
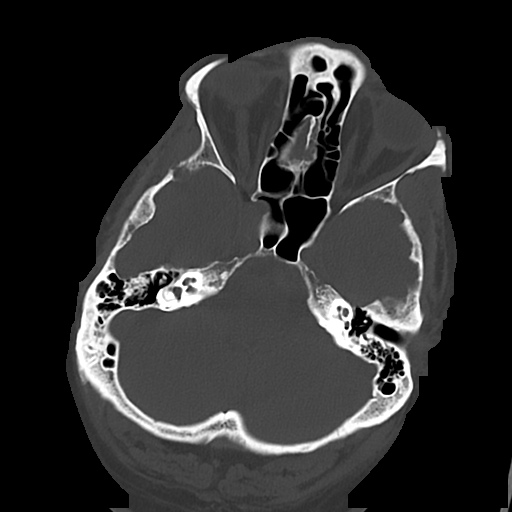
[im 15/74  bone]
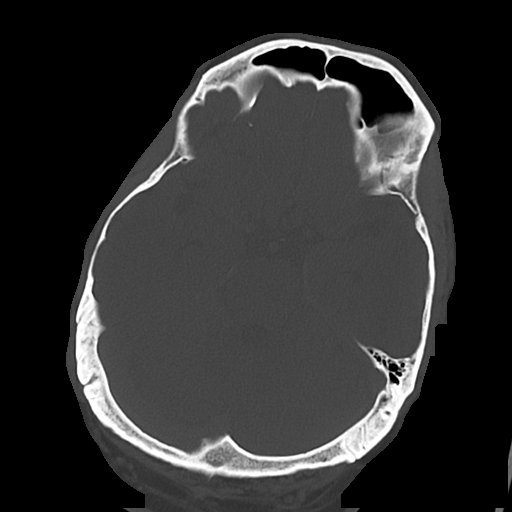
[im 22/74  bone]
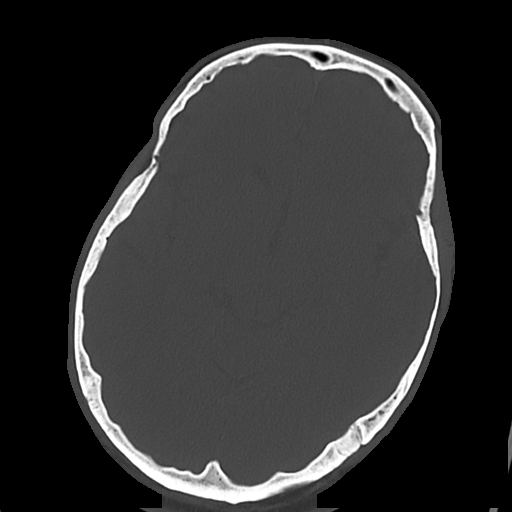

[Series 5: cor soft · coronal · 0.29mm/px · 3 of 62 slices shown]
[im 21/62  brain]
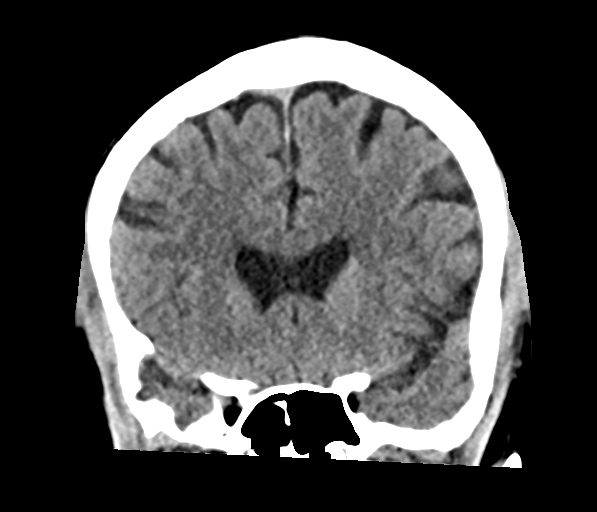
[im 28/62  brain]
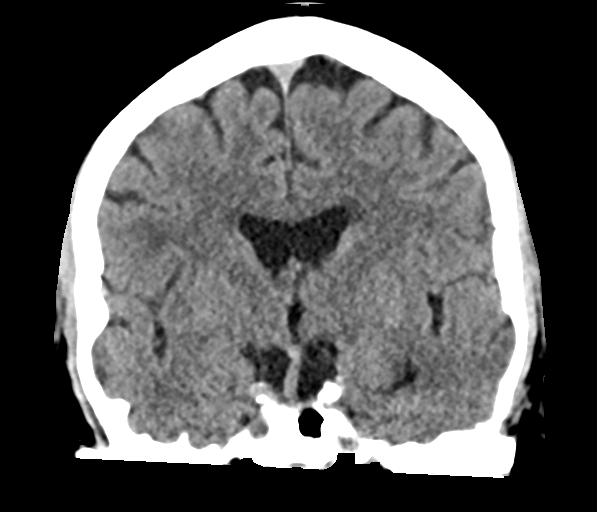
[im 34/62  brain]
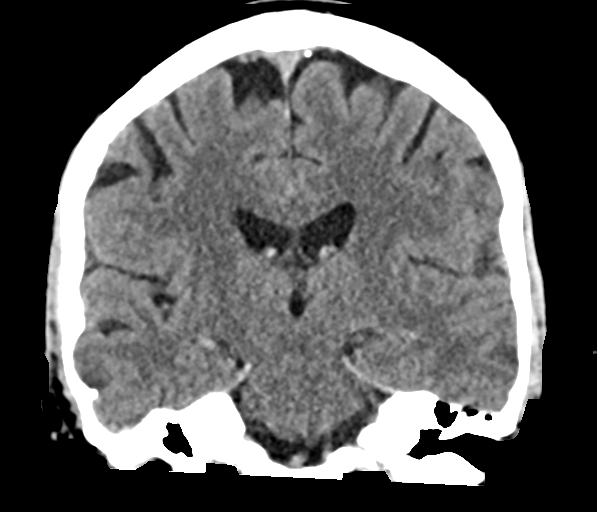

[Series 6: sag soft · sagittal · 0.29mm/px · 3 of 51 slices shown]
[im 17/51  brain]
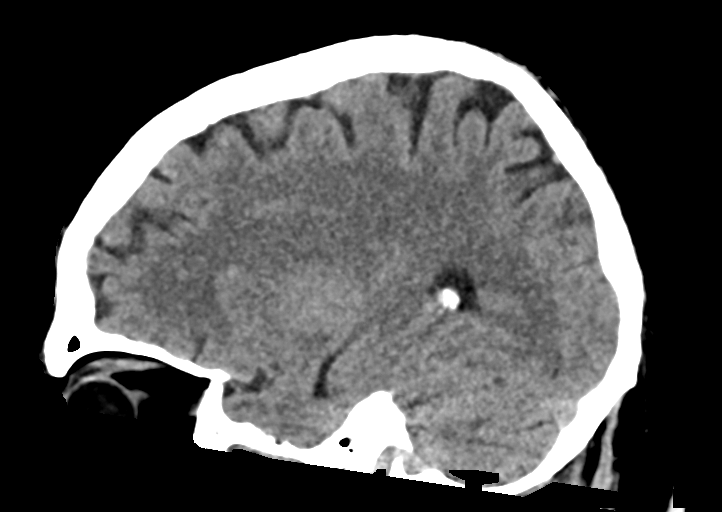
[im 26/51  brain]
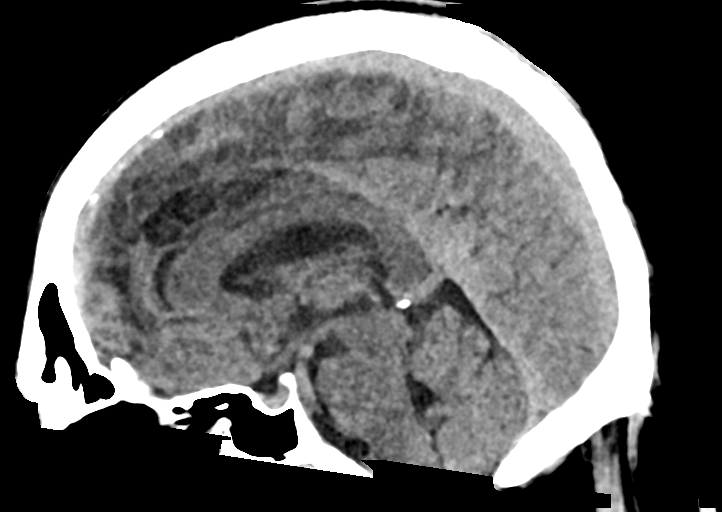
[im 34/51  brain]
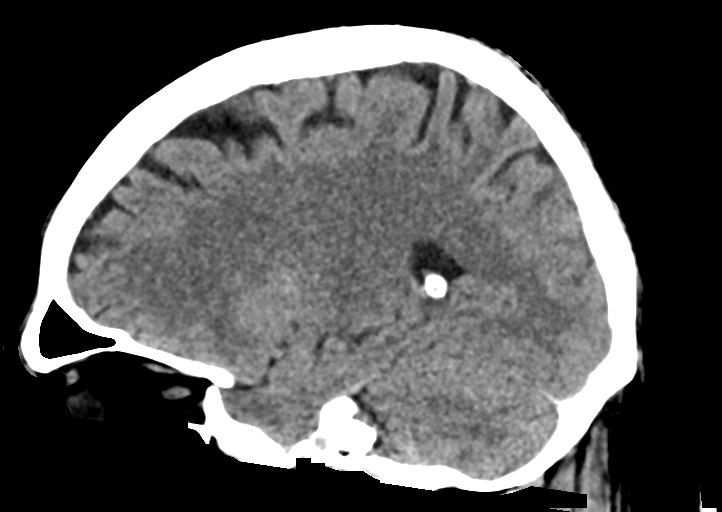

[16 of 47 positions shown; findings below may reference images not displayed]

FINDINGS: Brain: Ventricle size normal.  Cerebral volume normal.

13 mm hypodensity left parietal subcortical white matter. This shows
mild growth since [VV] and [VV] CT.

Negative for acute infarct, or  hemorrhage

Vascular: Negative for hyperdense vessel

Skull: Negative

Sinuses/Orbits: Negative

Other: None
IMPRESSION: Negative for hemorrhage or acute infarct

13 mm hypodensity left parietal subcortical white matter shows
slight interval growth since [VV] and [VV]. This could represent
chronic ischemia. Indolent tumor possible. Recommend MRI brain
without with contrast.

## 2021-10-09 IMAGING — MR MR MRV HEAD W/O CM
2 of 3 series · 13 of 48 positions shown · non-contrast
Comparison: Head CT [DATE]

CLINICAL DATA: Lightheadedness and dizziness.  Headache.

EXAM:
MR VENOGRAM HEAD WITHOUT CONTRAST
TECHNIQUE: Angiographic images of the intracranial venous structures were
acquired using MRV technique without intravenous contrast.

[Series 9: TOF · coronal · 1.8mm · 0.43mm/px · 12 of 108 slices shown (1 of 2)]
[im 1/108]
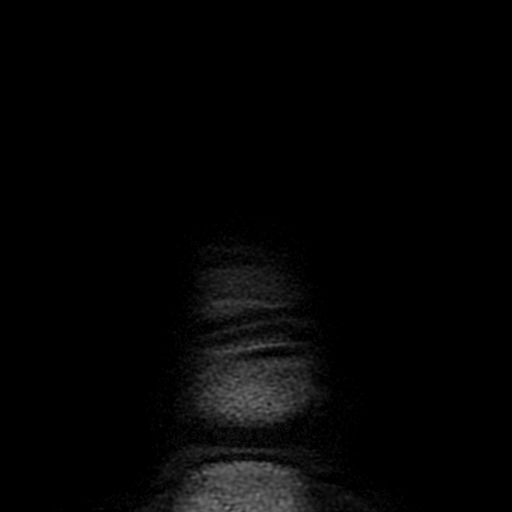
[im 10/108]
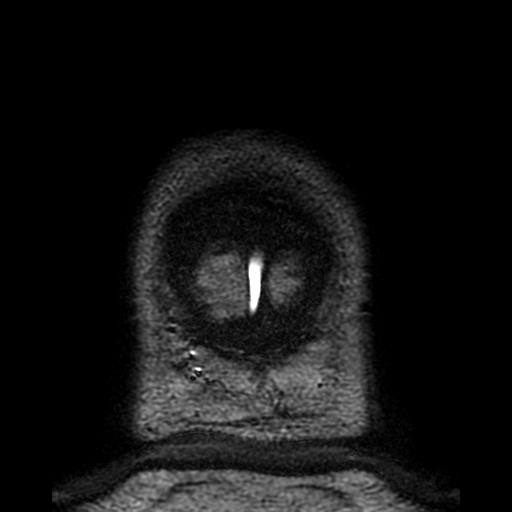
[im 20/108]
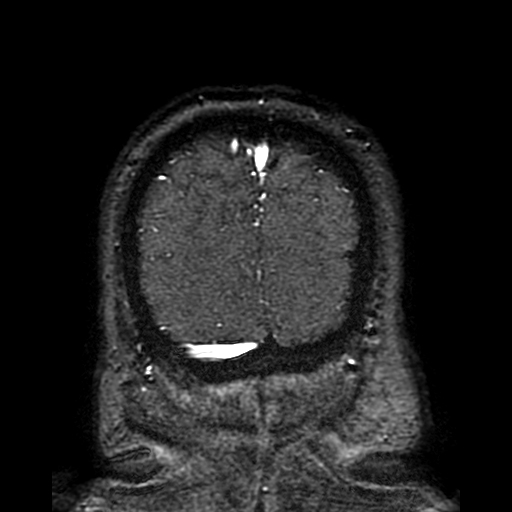
[im 30/108]
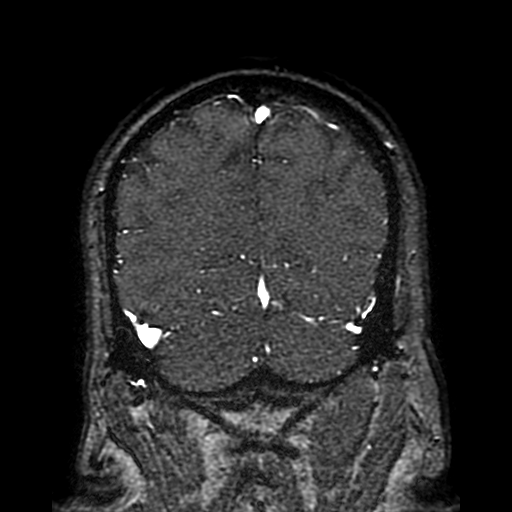
[im 39/108]
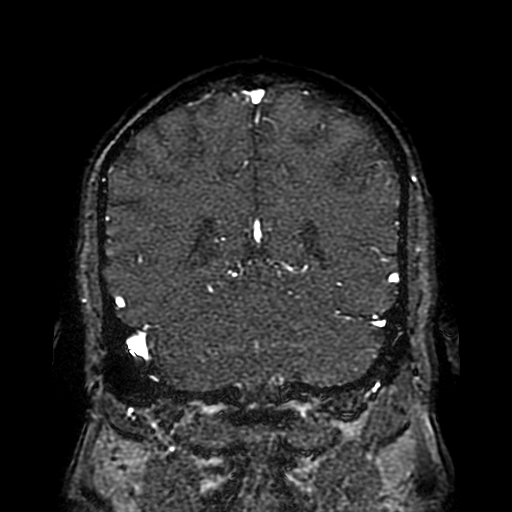
[im 49/108]
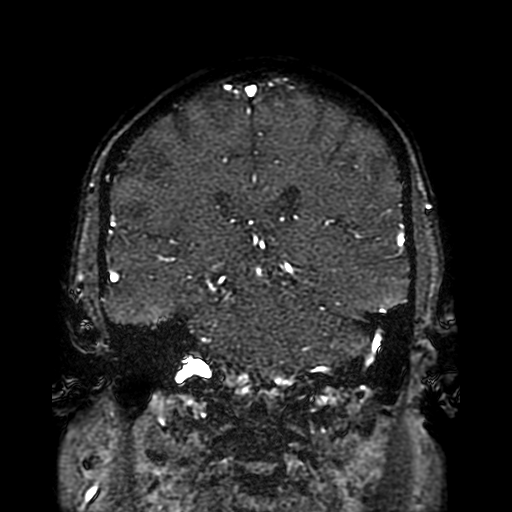
[im 59/108]
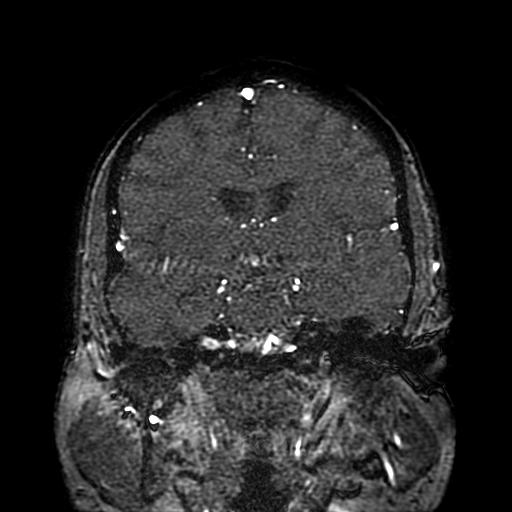
[im 69/108]
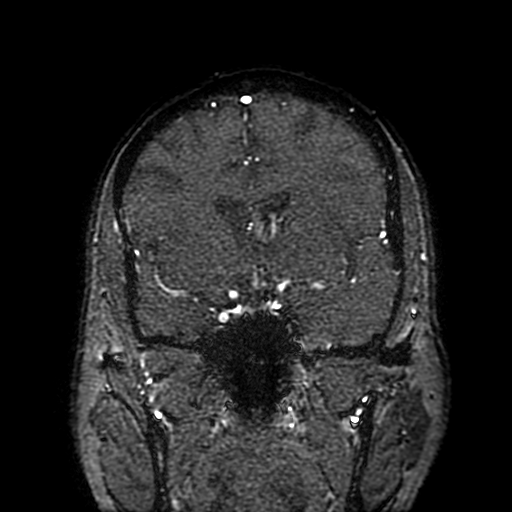
[im 78/108]
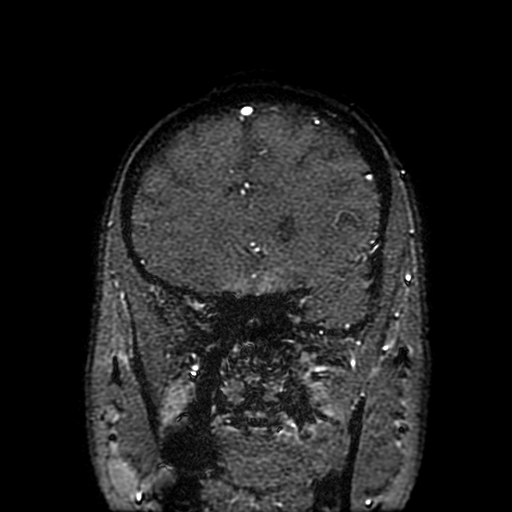
[im 88/108]
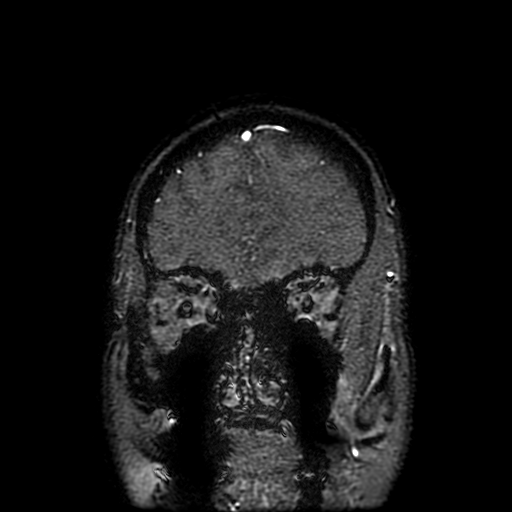
[im 98/108]
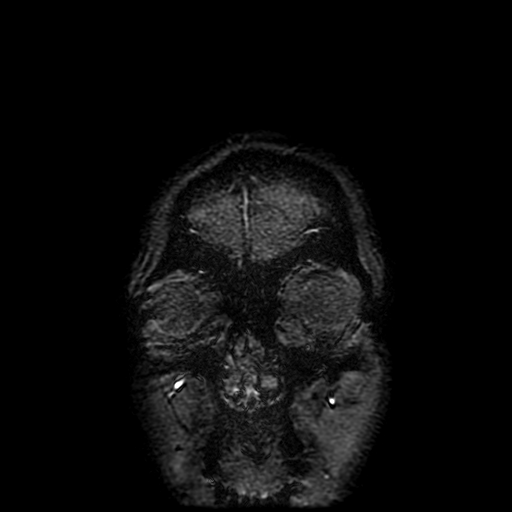
[im 108/108]
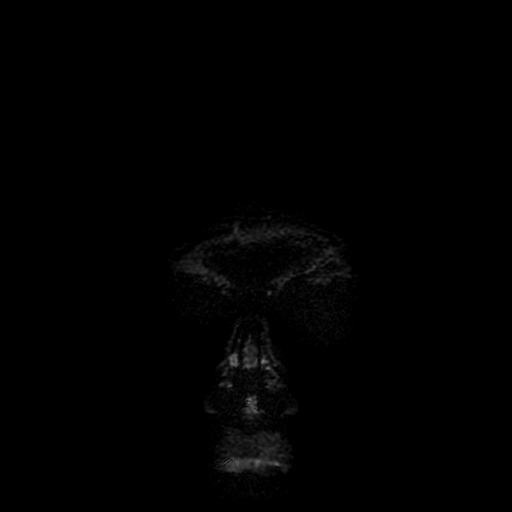

[Series 901: TOF · sagittal · 1.8mm · 0.43mm/px · 1 of 4 slices shown (2 of 2)]
[im 1/4]
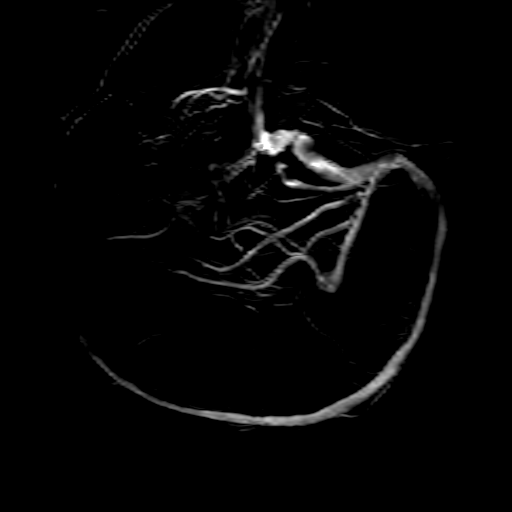

[13 of 48 positions shown; findings below may reference images not displayed]

FINDINGS: Superior sagittal sinus: Normal.

Straight sinus: Normal.

Inferior sagittal sinus, vein of BASKAN and internal cerebral veins:
Normal.

Transverse sinuses: Normal right transverse sinus. No flow related
enhancement in, which is likely congenitally diminutive. There is no
corresponding hyperdensity at this location on the earlier head CT
and no abnormality the earlier MRI.

Sigmoid sinuses: Normal.

Visualized jugular veins: Normal.
IMPRESSION: 1. No dural venous sinus thrombosis.
2. Suspected congenitally diminutive right transverse sinus.

## 2021-10-09 MED ORDER — LORAZEPAM 2 MG/ML IJ SOLN: 0.5000 mg | Freq: Once | INTRAMUSCULAR | Status: DC

## 2021-10-09 MED ORDER — GADOBUTROL 1 MMOL/ML IV SOLN: 9.0000 mL | Freq: Once | INTRAVENOUS | Status: DC | PRN

## 2021-10-09 NOTE — ED Triage Notes (Signed)
Pt states palpitations, light-headed and dizzy when heart is racing. Has a headache, as if he is having stabbing pain in his head.

## 2021-10-09 NOTE — ED Notes (Signed)
Pt is still at MRI at this time

## 2021-10-09 NOTE — ED Provider Notes (Signed)
[facility] EMERGENCY DEPARTMENT Provider Note   CSN: [id number] Arrival date & time: 10/09/21  1549     History Chief Complaint  Patient presents with   Palpitations   Headache    Blake Vaughn is a 60 y.o. male with PMH diabetes, follicular lymphoma, previous GI bleed, colon cancer status postchemotherapy who presents emergency department for evaluation of headache, palpitations and anxiety.  Dates that approximately 11 AM this morning he had a sudden onset headache with associated anxiety and palpitations.  The symptoms have since resolved.  He currently denies headache, shortness of breath, chest pain, abdominal pain, nausea, vomiting, numbness, tingling, weakness or other systemic symptoms.   Palpitations Associated symptoms: no back pain, no chest pain, no cough, no shortness of breath and no vomiting   Headache Associated symptoms: no abdominal pain, no back pain, no cough, no ear pain, no eye pain, no fever, no seizures, no sore throat and no vomiting       Past Medical History:  Diagnosis Date   Appendicitis 10/21/2018   Diabetes mellitus    Follicular lymphoma ([place]), 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   Qualifier: Diagnosis of  By: [name], Robert D    GI bleed 11/05/2018   Hypercholesterolemia    Hypertension 11/27/2011   Lymphoma ([place])    gets annual chemo last tx Feb 2013   TOBACCO USE, QUIT 08/18/2009   Qualifier: Diagnosis of  By: Drue Flirt  MD, Taineisha      Patient Active Problem List   Diagnosis Date Noted   Blurred vision 04/30/2021   Microalbuminuria 06/05/2020   Need for immunization against influenza 01/03/2020   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 12/24/2019   Right shoulder pain 08/16/2019   Bilateral shoulder pain 08/02/2019   Neuropathy 07/30/2019   Port-A-Cath in place 11/25/2018   Cancer of right colon ([place]) 11/12/2018   Syncope due to orthostatic hypotension 11/05/2018   Hypoalbuminemia 11/05/2018   Hypoproteinemia  ([place]) 11/05/2018   Uses Spanish as primary spoken language 10/23/2018   Obesity (BMI 30-39.9) 10/23/2018   Mass of cecum s/p right colectomy 10/21/2018 10/22/2018   Nocturnal leg cramps 07/16/2017   HTN (hypertension) 11/27/2011   Pure hypercholesterolemia 09/24/2009   ERECTILE DYSFUNCTION, 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   Diabetes type 2, uncontrolled 07/09/2009   DIABETIC CATARACT 62/95/2841   Follicular lymphoma ([place]), 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    Past Surgical History:  Procedure Laterality Date   ESOPHAGOGASTRODUODENOSCOPY (EGD) WITH PROPOFOL N/A 11/05/2018   Procedure: ESOPHAGOGASTRODUODENOSCOPY (EGD) WITH PROPOFOL;  Surgeon: [name], MD;  Location: [facility];  Service: Endoscopy;  Laterality: N/A;   IR IMAGING GUIDED PORT INSERTION  11/24/2018   LAPAROSCOPIC APPENDECTOMY N/A 10/21/2018   Procedure: Exploratory laparotomy right hemicolectomy;  Surgeon: [name], MD;  Location: [facility];  Service: General;  Laterality: N/A;       Family History  Problem Relation Age of Onset   Diabetes Mother    Diabetes Father    Renal Disease Brother    Diabetes Brother    Cancer Brother        lymphoma     Social History   Tobacco Use   Smoking status: Former    Packs/day: 0.50    Years: 30.00    Pack years: 15.00    Types: Cigarettes    Quit date: 12/08/2004    Years since quitting: 16.8   Smokeless tobacco: Former    Quit date: 11/20/2006  Vaping Use   Vaping Use: Never used  Substance Use Topics   Alcohol use: No   Drug use: No    Home Medications Prior to Admission medications   Medication Sig Start Date End Date Taking? Authorizing Provider  Accu-Chek Softclix Lancets lancets Use as instructed 07/30/19   [name], [name], DO  Blood Glucose Monitoring Suppl (ACCU-CHEK AVIVA PLUS) w/Device KIT Inject 1 kit into the skin 2 (two) times daily. 01/03/20   Daisy Floro, DO  diphenhydrAMINE (BENADRYL) 50 MG tablet Take 1 tablet (50 mg total) by mouth once for 1  dose. Take 1 tablet 1 hour before CT scan 09/18/21 09/18/21  [name], MD  glipiZIDE (GLUCOTROL XL) 10 MG 24 hr tablet TAKE 1 TABLET BY MOUTH ONCE DAILY BREAKFAST. 06/12/21   Lilland, Alana, DO  glucose blood (ACCU-CHEK AVIVA PLUS) test strip Use as instructed 07/30/19   [name], [name], DO  lidocaine-prilocaine (EMLA) cream Apply 1 application topically as needed. 12/05/19   [name], MD  metFORMIN (GLUCOPHAGE) 1000 MG tablet Take 1 tablet (1,000 mg total) by mouth 2 (two) times daily with a meal. 01/22/21   Lilland, Alana, DO  sitaGLIPtin (JANUVIA) 50 MG tablet Take 1 tablet (50 mg total) by mouth daily. 01/22/21   Lilland, Alana, DO    Allergies    Iohexol  Review of Systems   Review of Systems  Constitutional:  Negative for chills and fever.  HENT:  Negative for ear pain and sore throat.   Eyes:  Negative for pain and visual disturbance.  Respiratory:  Negative for cough and shortness of breath.   Cardiovascular:  Positive for palpitations. Negative for chest pain.  Gastrointestinal:  Negative for abdominal pain and vomiting.  Genitourinary:  Negative for dysuria and hematuria.  Musculoskeletal:  Negative for arthralgias and back pain.  Skin:  Negative for color change and rash.  Neurological:  Positive for headaches. Negative for seizures and syncope.  All other systems reviewed and are negative.  Physical Exam Updated Vital Signs BP 137/76   Pulse 80   Temp 98 F (36.7 C)   Resp 20   Ht _0  (1.651 m)   Wt 88.9 kg   SpO2 100%   BMI 32.62 kg/m   Physical Exam Vitals and nursing note reviewed.  Constitutional:      Appearance: He is well-developed.  HENT:     Head: Normocephalic and atraumatic.  Eyes:     Conjunctiva/sclera: Conjunctivae normal.  Cardiovascular:     Rate and Rhythm: Normal rate and regular rhythm.     Heart sounds: No murmur heard. Pulmonary:     Effort: Pulmonary effort is normal. No respiratory distress.     Breath sounds: Normal breath sounds.   Abdominal:     Palpations: Abdomen is soft.     Tenderness: There is no abdominal tenderness.  Musculoskeletal:     Cervical back: Neck supple.  Skin:    General: Skin is warm and dry.  Neurological:     Mental Status: He is alert.    ED Results / Procedures / Treatments   Labs (all labs ordered are listed, but only abnormal results are displayed) Labs Reviewed  COMPREHENSIVE METABOLIC PANEL - Abnormal; Notable for the following components:      Result Value   Sodium 131 (*)    CO2 20 (*)    BUN 21 (*)    Creatinine, Ser 1.26 (*)    Total Protein 6.4 (*)    All other components within normal limits  CBC WITH DIFFERENTIAL/PLATELET -  Abnormal; Notable for the following components:   HCT 38.4 (*)    Monocytes Absolute 1.1 (*)    All other components within normal limits  CBG MONITORING, ED - Abnormal; Notable for the following components:   Glucose-Capillary 111 (*)    All other components within normal limits  RESP PANEL BY RT-PCR (FLU A&B, COVID) ARPGX2  LIPASE, BLOOD  TROPONIN I (HIGH SENSITIVITY)  TROPONIN I (HIGH SENSITIVITY)    EKG None  Radiology DG Chest 2 View  Result Date: 10/09/2021 CLINICAL DATA:  Palpitations. EXAM: CHEST - 2 VIEW COMPARISON:  Chest x-ray 08/31/2020. FINDINGS: Right chest port catheter PICC tip projects over the caval atrial junction. The heart size and mediastinal contours are within normal limits. There is a stable calcified nodule in the right lower lung. Lungs otherwise appear clear. The visualized skeletal structures are unremarkable. IMPRESSION: No active cardiopulmonary disease. Electronically Signed   By: Ronney Asters M.D.   On: 10/09/2021 18:10   CT HEAD WO CONTRAST (5MM)  Result Date: 10/09/2021 CLINICAL DATA:  Headache EXAM: CT HEAD WITHOUT CONTRAST TECHNIQUE: Contiguous axial images were obtained from the base of the skull through the vertex without intravenous contrast. COMPARISON:  CT head 10/11/2019 FINDINGS: Brain: Ventricle  size normal.  Cerebral volume normal. 13 mm hypodensity left parietal subcortical white matter. This shows mild growth since 2017 and 2020 CT. Negative for acute infarct, or  hemorrhage Vascular: Negative for hyperdense vessel Skull: Negative Sinuses/Orbits: Negative Other: None IMPRESSION: Negative for hemorrhage or acute infarct 13 mm hypodensity left parietal subcortical white matter shows slight interval growth since 2020 and 2017. This could represent chronic ischemia. Indolent tumor possible. Recommend MRI brain without with contrast. Electronically Signed   By: Franchot Gallo M.D.   On: 10/09/2021 18:40    Procedures Procedures   Medications Ordered in ED Medications - No data to display  ED Course  I have reviewed the triage vital signs and the nursing notes.  Pertinent labs & imaging results that were available during my care of the patient were reviewed by me and considered in my medical decision making (see chart for details).    MDM Rules/Calculators/A&P                           Patient seen in the emergency department for evaluation of palpitations and headache.  At the time my evaluation, the symptoms have completely resolved.  Physical exam is unremarkable with no focal motor or sensory deficits.  No cranial nerve deficits.  Laboratory evaluation with a mild hyponatremia to 131, creatinine 1.26 but is otherwise unremarkable.  COVID and flu negative.  Chest x-ray unremarkable.  CT head shows a 13 mm hypodensity on the left parietal subcortical white matter with slight interval growth since 2020.  Unable to determine if old stroke versus tumor.  Radiology recommended MRI and an MRI was obtained along with an MRV to rule out dural venous sinus thrombosis which was reassuringly negative for this pathology or tumor.  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  I reevaluation, patient symptoms remain resolved and the  patient was discharged with outpatient primary care follow-up. Final Clinical Impression(s) / ED Diagnoses Final diagnoses:  None    Rx / DC Orders ED Discharge Orders     None        Akaya Proffit, Debe Coder, MD 10/14/21 [phone]

## 2021-10-09 NOTE — Discharge Instructions (Signed)
You were seen in the emergency department for evaluation of a headache and heart palpitations.  A CAT scan was performed that showed a possible lesion in the brain and MRI was performed that reassuringly shows that this lesion is an old stroke.  At this time you do not need hospital admission and will be safe for discharge but it is important that you follow-up with your primary care physician.  Return the emergency department if you have new or worsening headaches, nausea, vomiting, weight loss, fever, numbness, tingling or any other concerning symptoms.

## 2021-10-09 NOTE — ED Provider Notes (Signed)
Emergency Medicine Provider Triage Evaluation Note  COSMO TETREAULT , a 60 y.o. male  was evaluated in triage.  Pt complains of palpitations and head aches.  This started yesterday.  He feels like his heart is racing.  This lasts for a minute or two.  It happens every half an hour.  He feels like he is going to pass out when it happens.  No fevers.  No leg swelling.   The headache feels like a stabbing pain.  Review of Systems  Positive: Headache, palpitations/feels like heart is racing, lightheaded/dizzy Negative: Syncope, fever  Physical Exam  BP (!) 143/70 (BP Location: Right Arm)   Pulse 87   Temp 98 F (36.7 C)   Resp 18   SpO2 100%  Gen:   Awake, no distress   Resp:  Normal effort  MSK:   Moves extremities without difficulty  Other:  Normal speech.  Moves all extremities without difficulty.  Medical Decision Making  Medically screening exam initiated at 5:05 PM.  Appropriate orders placed.  JONNATAN HANNERS was informed that the remainder of the evaluation will be completed by another provider, this initial triage assessment does not replace that evaluation, and the importance of remaining in the ED until their evaluation is complete.  Patient with intermittent feelings of rapid heart rate and headache.  Will obtain labs.  Will send flu and COVID testing.  He is afebrile.  Will obtain CT head in addition to EKG and chest x-rays.   Lorin Glass, [name] 10/09/21 1722    Hayden Rasmussen, MD 10/10/21 1020

## 2021-10-10 ENCOUNTER — Ambulatory Visit: Payer: Medicare Other

## 2021-10-22 ENCOUNTER — Other Ambulatory Visit: Payer: Self-pay | Admitting: Family Medicine

## 2021-10-22 DIAGNOSIS — E1165 Type 2 diabetes mellitus with hyperglycemia: Secondary | ICD-10-CM

## 2021-11-08 ENCOUNTER — Other Ambulatory Visit: Payer: Self-pay | Admitting: Internal Medicine

## 2021-11-08 ENCOUNTER — Other Ambulatory Visit (HOSPITAL_COMMUNITY): Payer: Self-pay | Admitting: Physician Assistant

## 2021-11-08 NOTE — H&P (Signed)
 Chief Complaint: Patient was seen in consultation today for port-a-cath removal   Referring Physician(s): Feng,Yan  Supervising Physician: Henn, Adam  Patient Status: [facility] - Out-pt  History of Present Illness: Blake Vaughn is a 60 y.o. male with a medical history significant for HTN, DM, lymphoma currently in remission and colon cancer s/p hemicolectomy and chemotherapy. He is familiar to IR from port placement 11/24/18. He has been under cancer surveillance and has had no evidence of disease since October 2021.  Interventional Radiology has been asked to evaluate this patient for port-a-catheter removal.   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   Qualifier: Diagnosis of  By: Kaplan MD, Robert D    GI bleed 11/05/2018   Hypercholesterolemia    Hypertension 11/27/2011   Lymphoma ([facility])    gets annual chemo last tx Feb 2013   TOBACCO USE, QUIT 08/18/2009   Qualifier: Diagnosis of  By: Bolden  MD, Taineisha      Past Surgical History:  Procedure Laterality Date   ESOPHAGOGASTRODUODENOSCOPY (EGD) WITH PROPOFOL N/A 11/05/2018   Procedure: ESOPHAGOGASTRODUODENOSCOPY (EGD) WITH PROPOFOL;  Surgeon: Buccini, Robert, MD;  Location: [facility];  Service: Endoscopy;  Laterality: N/A;   IR IMAGING GUIDED PORT INSERTION  11/24/2018   LAPAROSCOPIC APPENDECTOMY N/A 10/21/2018   Procedure: Exploratory laparotomy right hemicolectomy;  Surgeon: White, Christopher M, MD;  Location: [facility];  Service: General;  Laterality: N/A;    Allergies: Iohexol  Medications: Prior to Admission medications   Medication Sig Start Date End Date Taking? Authorizing Provider  Accu-Chek Softclix Lancets lancets Use as instructed 07/30/19   [name], Jordan, DO  Blood Glucose Monitoring Suppl (ACCU-CHEK AVIVA PLUS) w/Device KIT Inject 1 kit into the skin 2 (two) times daily. 01/03/20   Anderson, Hannah C, DO   diphenhydrAMINE (BENADRYL) 50 MG tablet Take 1 tablet (50 mg total) by mouth once for 1 dose. Take 1 tablet 1 hour before CT scan 09/18/21 09/18/21  Feng, Yan, MD  glipiZIDE (GLUCOTROL XL) 10 MG 24 hr tablet Take 1 tablet by mouth once daily with breakfast 10/23/21   Lilland, Alana, DO  glucose blood (ACCU-CHEK AVIVA PLUS) test strip Use as instructed 07/30/19   [name], Jordan, DO  lidocaine-prilocaine (EMLA) cream Apply 1 application topically as needed. 12/05/19   Feng, Yan, MD  metFORMIN (GLUCOPHAGE) 1000 MG tablet Take 1 tablet (1,000 mg total) by mouth 2 (two) times daily with a meal. 01/22/21   Lilland, Alana, DO  sitaGLIPtin (JANUVIA) 50 MG tablet Take 1 tablet (50 mg total) by mouth daily. 01/22/21   Lilland, Alana, DO     Family History  Problem Relation Age of Onset   Diabetes Mother    Diabetes Father    Renal Disease Brother    Diabetes Brother    Cancer Brother        lymphoma     Social History   Socioeconomic History   Marital status: Married    Spouse name: Not on file   Number of children: 2   Years of education: Not on file   Highest education level: Not on file  Occupational History   Not on file  Tobacco Use   Smoking status: Former    Packs/day: 0.50    Years: 30.00    Pack years: 15.00    Types: Cigarettes    Quit date: 12/08/2004    Years since quitting: 16.9     Smokeless tobacco: Former    Quit date: 11/20/2006  Vaping Use   Vaping Use: Never used  Substance and Sexual Activity   Alcohol use: No   Drug use: No   Sexual activity: Not on file  Other Topics Concern   Not on file  Social History Narrative   Not on file   Social Determinants of Health   Financial Resource Strain: Not on file  Food Insecurity: Not on file  Transportation Needs: Not on file  Physical Activity: Not on file  Stress: Not on file  Social Connections: Not on file    Review of Systems: A 12 point ROS discussed and pertinent positives are indicated in the HPI above.   All other systems are negative.  Review of Systems  Constitutional:  Negative for chills and fatigue.  Respiratory:  Negative for cough and shortness of breath.   Cardiovascular:  Negative for chest pain and leg swelling.  Gastrointestinal:  Negative for abdominal pain, diarrhea, nausea and vomiting.  Neurological:  Positive for numbness. Negative for headaches.       Chemo-related neuropathy   Vital Signs: BP (!) 144/80   Pulse 70   Temp 98 F (36.7 C) (Oral)   Resp 17   SpO2 100%   Physical Exam Constitutional:      General: He is not in acute distress.    Appearance: He is not ill-appearing.  HENT:     Mouth/Throat:     Mouth: Mucous membranes are moist.     Pharynx: Oropharynx is clear.  Cardiovascular:     Rate and Rhythm: Normal rate and regular rhythm.     Comments: Right upper chest port-a-catheter; de-accessed. Site unremarkable.  Pulmonary:     Effort: Pulmonary effort is normal.     Breath sounds: Normal breath sounds.  Abdominal:     General: Bowel sounds are normal.     Palpations: Abdomen is soft.     Tenderness: There is no abdominal tenderness.  Musculoskeletal:     Right lower leg: No edema.     Left lower leg: No edema.  Skin:    General: Skin is warm and dry.  Neurological:     Mental Status: He is alert and oriented to person, place, and time.    Imaging: No results found.  Labs:  CBC: Recent Labs    05/30/21 1029 08/01/21 1053 09/24/21 0902 10/09/21 1717  WBC 8.2 8.0 11.1* 9.9  HGB 13.9 14.9 14.2 13.8  HCT 39.7 41.9 40.1 38.4*  PLT 174 219 229 236    COAGS: No results for input(s): INR, APTT in the last 8760 hours.  BMP: Recent Labs    05/30/21 1029 08/01/21 1053 09/24/21 0902 10/09/21 1717  NA 138 133* 137 131*  K 4.7 5.0 4.6 3.8  CL 105 100 103 101  CO2 24 24 21* 20*  GLUCOSE 224* 329* 396* 96  BUN 19 21* 19 21*  CALCIUM 9.5 9.5 9.6 9.4  CREATININE 0.97 1.40* 1.14 1.26*  GFRNONAA >60 58* >60 >60    LIVER  FUNCTION TESTS: Recent Labs    05/30/21 1029 08/01/21 1053 09/24/21 0902 10/09/21 1717  BILITOT 0.6 0.7 0.6 0.7  AST 19 17 16 22  ALT 21 22 20 20  ALKPHOS 65 83 77 54  PROT 6.5 6.7 7.2 6.4*  ALBUMIN 3.4* 3.7 4.0 3.9    TUMOR MARKERS: No results for input(s): AFPTM, CEA, CA199, CHROMGRNA in the last 8760 hours.  Assessment and Plan:    Colon cancer currently in remission; port-a-catheter no longer needed: Blake Vaughn, 61 year old male, presents today to the [facility] department for port-a-catheter removal. The procedure was discussed and consent obtained with the assistance of a Spanish-speaking interpreter who was in the room.   Risks and benefits of image-guided Port-a-catheter placement were discussed with the patient including, but not limited to bleeding and infection.  All of the patient's questions were answered, patient is agreeable to proceed. He had a few small sips of orange juice around 0700 today but has otherwise been NPO. Sedation RN made aware.   Consent signed and in chart.  Thank you for this interesting consult.  I greatly enjoyed meeting Blake Vaughn and look forward to participating in their care.  A copy of this report was sent to the requesting provider on this date.  Electronically Signed: Soyla Dryer, AGACNP-BC [phone] 11/11/2021, 10:21 AM   I spent a total of  30 Minutes   in face to face in clinical consultation, greater than 50% of which was counseling/coordinating care for port-a-catheter removal.

## 2021-11-11 ENCOUNTER — Encounter (HOSPITAL_COMMUNITY): Payer: Self-pay

## 2021-11-11 ENCOUNTER — Ambulatory Visit (HOSPITAL_COMMUNITY)
Admission: RE | Admit: 2021-11-11 | Discharge: 2021-11-11 | Disposition: A | Discharge status: Home / Self Care | Payer: Medicare Other | Source: Ambulatory Visit | Attending: Hematology | Admitting: Hematology

## 2021-11-11 ENCOUNTER — Other Ambulatory Visit: Payer: Self-pay

## 2021-11-11 DIAGNOSIS — I10 Essential (primary) hypertension: Secondary | ICD-10-CM | POA: Insufficient documentation

## 2021-11-11 DIAGNOSIS — E119 Type 2 diabetes mellitus without complications: Secondary | ICD-10-CM | POA: Diagnosis not present

## 2021-11-11 DIAGNOSIS — Z85038 Personal history of other malignant neoplasm of large intestine: Secondary | ICD-10-CM | POA: Insufficient documentation

## 2021-11-11 DIAGNOSIS — C182 Malignant neoplasm of ascending colon: Secondary | ICD-10-CM

## 2021-11-11 DIAGNOSIS — C189 Malignant neoplasm of colon, unspecified: Secondary | ICD-10-CM | POA: Insufficient documentation

## 2021-11-11 DIAGNOSIS — Z452 Encounter for adjustment and management of vascular access device: Secondary | ICD-10-CM | POA: Diagnosis present

## 2021-11-11 HISTORY — PX: IR REMOVAL TUN ACCESS W/ PORT W/O FL MOD SED: IMG2290

## 2021-11-11 LAB — GLUCOSE, CAPILLARY: Glucose-Capillary: 193 mg/dL — ABNORMAL HIGH (ref 70–99)

## 2021-11-11 MED ORDER — NALOXONE HCL 0.4 MG/ML IJ SOLN
INTRAMUSCULAR | Status: AC
  Filled 2021-11-11: qty 1

## 2021-11-11 MED ORDER — FENTANYL CITRATE (PF) 100 MCG/2ML IJ SOLN
INTRAMUSCULAR | Status: AC | PRN
  Administered 2021-11-11: 50 ug via INTRAVENOUS

## 2021-11-11 MED ORDER — SODIUM CHLORIDE 0.9 % IV SOLN: INTRAVENOUS | Status: DC

## 2021-11-11 MED ORDER — FLUMAZENIL 0.5 MG/5ML IV SOLN
INTRAVENOUS | Status: AC
  Filled 2021-11-11: qty 5

## 2021-11-11 MED ORDER — FENTANYL CITRATE (PF) 100 MCG/2ML IJ SOLN
INTRAMUSCULAR | Status: AC
  Filled 2021-11-11: qty 2

## 2021-11-11 MED ORDER — MIDAZOLAM HCL 2 MG/2ML IJ SOLN
INTRAMUSCULAR | Status: AC | PRN
  Administered 2021-11-11: 1 mg via INTRAVENOUS

## 2021-11-11 MED ORDER — MIDAZOLAM HCL 2 MG/2ML IJ SOLN
INTRAMUSCULAR | Status: AC
  Filled 2021-11-11: qty 4

## 2021-11-11 NOTE — Procedures (Signed)
Interventional Radiology Procedure:   Indications: History of colon cancer, port is no longer needed.  Procedure: Port removal  Findings: Complete removal of right chest port  Complications: No immediate complications noted.     EBL: Minimal  Plan: Keep incision dry for 24 hours   Shuna Tabor R. Anselm Pancoast, MD  Pager: [phone]

## 2022-01-03 DIAGNOSIS — H34812 Central retinal vein occlusion, left eye, with macular edema: Secondary | ICD-10-CM | POA: Diagnosis not present

## 2022-01-20 ENCOUNTER — Other Ambulatory Visit: Payer: Self-pay

## 2022-01-20 ENCOUNTER — Ambulatory Visit (INDEPENDENT_AMBULATORY_CARE_PROVIDER_SITE_OTHER): Payer: Medicare Other | Admitting: Family Medicine

## 2022-01-20 VITALS — BP 137/92 | HR 78 | Wt 196.0 lb

## 2022-01-20 DIAGNOSIS — R519 Headache, unspecified: Secondary | ICD-10-CM

## 2022-01-20 DIAGNOSIS — E1165 Type 2 diabetes mellitus with hyperglycemia: Secondary | ICD-10-CM

## 2022-01-20 LAB — POCT GLYCOSYLATED HEMOGLOBIN (HGB A1C): HbA1c, POC (controlled diabetic range): 7.8 % — AB (ref 0.0–7.0)

## 2022-01-20 NOTE — Progress Notes (Signed)
° ° °  SUBJECTIVE:   CHIEF COMPLAINT / HPI:  A Spanish translator was used for this evaluation  Headache Patient presents complaining of headache.  Has been evaluated in the emergency department for this in the past.  Most recently was 10/09/2021 her imaging was obtained.  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  There was a hypodense area shown on head CT which was also evaluated via the MRI and it was included in these old infarcts.  Patient reports that since last Thursday he has had intermittent headache.  He says that after taking Tylenol the headache will resolve for 4 to 6 hours but then returned.  Denies any photophobia.  When asked to point to where the head he feels the pain he covers his whole head.  Denies any red flag symptoms such as slurred speech, weakness in upper or lower extremities, decreased sensation, dizziness.  Reports that he has been drinking water as well as a healthy drink with lemon in it.  On clear what this drink is.  Diabetes Currently on glipizide 10 mg, metformin 1000 mg twice daily, Januvia 50 mg daily.  Patient reports good compliance with his medications but does acknowledge that he does not check his blood sugars regularly because he does not like pricking his fingers.  Denies any hypoglycemic episodes.  OBJECTIVE:   BP (!) 137/92    Pulse 78    Wt 196 lb (88.9 kg)    SpO2 100%    BMI 32.62 kg/m   General: Well-appearing 61 year old male HEENT: Pupils equal and reactive to light, patient had difficulty following commands for extraocular eye movement but is intact, no nystagmus appreciated Cardiac: Regular rate and rhythm Respiratory: Normal work of breathing, speaking full sentences MSK: No gross abnormalities Neuro: Cranial nerves intact, upper and lower extremity strength 5/5, sensation intact bilaterally ASSESSMENT/PLAN:   Diabetes type 2, uncontrolled ([facility]) Hemoglobin A1c  today down to 7.8 from 8.7 at previous check.  Patient will continue current medication regimen glipizide, metformin, Januvia. - Follow-up in 3 months for repeat hemoglobin A1c - Monitor for hypoglycemic episodes and encourage patient to check his blood sugars more regularly.  Nonintractable headache Patient complaining of headache intermittently since last Thursday.  It does respond to Tylenol but he reports that he returns in 6 to 8 hours.  Was seen in the emergency department approximately 3 months ago for similar symptoms.  Neurologic exam today showing no gross abnormalities.  Discussed adequate hydration and continuation of Tylenol/NSAIDs as needed for pain.  Discussed strict return precautions and ED precautions.  Patient in agreement and will follow-up as needed.     Gifford Shave, MD [place]

## 2022-01-20 NOTE — Assessment & Plan Note (Signed)
Hemoglobin A1c today down to 7.8 from 8.7 at previous check.  Patient will continue current medication regimen glipizide, metformin, Januvia. - Follow-up in 3 months for repeat hemoglobin A1c - Monitor for hypoglycemic episodes and encourage patient to check his blood sugars more regularly.

## 2022-01-20 NOTE — Assessment & Plan Note (Signed)
Patient complaining of headache intermittently since last Thursday.  It does respond to Tylenol but he reports that he returns in 6 to 8 hours.  Was seen in the emergency department approximately 3 months ago for similar symptoms.  Neurologic exam today showing no gross abnormalities.  Discussed adequate hydration and continuation of Tylenol/NSAIDs as needed for pain.  Discussed strict return precautions and ED precautions.  Patient in agreement and will follow-up as needed.

## 2022-01-20 NOTE — Patient Instructions (Addendum)
It was good seeing you today.  Regarding your headaches I want you to continue using Tylenol as needed and you can also try occasional use of ibuprofen 200-400 mg.  This is an over-the-counter medication.  If you have any worsening symptoms, weakness in your arms or legs, slurred speech, sudden changes in vision be evaluated in the emergency department.  Regarding your diabetes your hemoglobin A1c is down to 7.8.  I want to increase your Januvia to 100 mg daily but I want to check your kidney function first.  If your kidneys are doing well at this check I will send a prescription for the increased Januvia.  If you have any questions or concerns please call the clinic.  I hope you have a great afternoon!  Fue bueno verte hoy. Con respecto a sus dolores de [place], quiero que contine usando Tylenol segn sea necesario y [place] puede probar el uso ocasional de ibuprofeno 200-400 mg. Este es un medicamento de [place]. Si tiene algn sntoma que [place], debilidad en los brazos o las piernas, dificultad para hablar, cambios repentinos en la visin, debe evaluarse en el departamento de emergencias. Con respecto a su diabetes, su hemoglobina A1c ha bajado a 7,8. Quiero aumentar su Januvia a 100 mg diarios, pero primero quiero Chief Technology Officer su funcin renal. Si sus riones estn funcionando [employer] control, le enviar una receta para aumentar Januvia. Si tiene alguna pregunta o inquietud, llame a la clnica. Espero que tengas una gran tarde!

## 2022-01-21 LAB — BASIC METABOLIC PANEL
BUN/Creatinine Ratio: 23 (ref 10–24)
BUN: 20 mg/dL (ref 8–27)
CO2: 23 mmol/L (ref 20–29)
Calcium: 9.4 mg/dL (ref 8.6–10.2)
Chloride: 105 mmol/L (ref 96–106)
Creatinine, Ser: 0.86 mg/dL (ref 0.76–1.27)
Glucose: 202 mg/dL — ABNORMAL HIGH (ref 70–99)
Potassium: 4.7 mmol/L (ref 3.5–5.2)
Sodium: 140 mmol/L (ref 134–144)
eGFR: 99 mL/min/{1.73_m2} (ref >59)

## 2022-01-27 ENCOUNTER — Emergency Department (HOSPITAL_COMMUNITY)
Admission: EM | Admit: 2022-01-27 | Discharge: 2022-01-27 | Disposition: A | Discharge status: Home / Self Care | Payer: Medicare Other | Attending: Emergency Medicine | Admitting: Emergency Medicine

## 2022-01-27 ENCOUNTER — Other Ambulatory Visit: Payer: Self-pay

## 2022-01-27 ENCOUNTER — Encounter (HOSPITAL_COMMUNITY): Payer: Self-pay

## 2022-01-27 ENCOUNTER — Emergency Department (HOSPITAL_COMMUNITY): Payer: Medicare Other

## 2022-01-27 DIAGNOSIS — R197 Diarrhea, unspecified: Secondary | ICD-10-CM | POA: Diagnosis not present

## 2022-01-27 DIAGNOSIS — Z7984 Long term (current) use of oral hypoglycemic drugs: Secondary | ICD-10-CM | POA: Insufficient documentation

## 2022-01-27 DIAGNOSIS — I700 Atherosclerosis of aorta: Secondary | ICD-10-CM | POA: Diagnosis not present

## 2022-01-27 DIAGNOSIS — R112 Nausea with vomiting, unspecified: Secondary | ICD-10-CM | POA: Diagnosis not present

## 2022-01-27 DIAGNOSIS — E119 Type 2 diabetes mellitus without complications: Secondary | ICD-10-CM | POA: Diagnosis not present

## 2022-01-27 DIAGNOSIS — K5900 Constipation, unspecified: Secondary | ICD-10-CM | POA: Diagnosis not present

## 2022-01-27 DIAGNOSIS — R748 Abnormal levels of other serum enzymes: Secondary | ICD-10-CM | POA: Insufficient documentation

## 2022-01-27 DIAGNOSIS — K529 Noninfective gastroenteritis and colitis, unspecified: Secondary | ICD-10-CM | POA: Diagnosis not present

## 2022-01-27 DIAGNOSIS — Z85038 Personal history of other malignant neoplasm of large intestine: Secondary | ICD-10-CM | POA: Insufficient documentation

## 2022-01-27 DIAGNOSIS — R109 Unspecified abdominal pain: Secondary | ICD-10-CM | POA: Diagnosis not present

## 2022-01-27 DIAGNOSIS — R1031 Right lower quadrant pain: Secondary | ICD-10-CM | POA: Diagnosis present

## 2022-01-27 LAB — COMPREHENSIVE METABOLIC PANEL
ALT: 18 U/L (ref 0–44)
AST: 16 U/L (ref 15–41)
Albumin: 3.9 g/dL (ref 3.5–5.0)
Alkaline Phosphatase: 69 U/L (ref 38–126)
Anion gap: 6 (ref 5–15)
BUN: 30 mg/dL — ABNORMAL HIGH (ref 8–23)
CO2: 23 mmol/L (ref 22–32)
Calcium: 8.9 mg/dL (ref 8.9–10.3)
Chloride: 108 mmol/L (ref 98–111)
Creatinine, Ser: 0.92 mg/dL (ref 0.61–1.24)
GFR, Estimated: >60 mL/min (ref >60)
Glucose, Bld: 182 mg/dL — ABNORMAL HIGH (ref 70–99)
Potassium: 4.8 mmol/L (ref 3.5–5.1)
Sodium: 137 mmol/L (ref 135–145)
Total Bilirubin: 0.3 mg/dL (ref 0.3–1.2)
Total Protein: 6.3 g/dL — ABNORMAL LOW (ref 6.5–8.1)

## 2022-01-27 LAB — CBC WITH DIFFERENTIAL/PLATELET
Abs Immature Granulocytes: 0.01 10*3/uL (ref 0.00–0.07)
Basophils Absolute: 0 10*3/uL (ref 0.0–0.1)
Basophils Relative: 0 %
Eosinophils Absolute: 0.3 10*3/uL (ref 0.0–0.5)
Eosinophils Relative: 4 %
HCT: 39.5 % (ref 39.0–52.0)
Hemoglobin: 13.5 g/dL (ref 13.0–17.0)
Immature Granulocytes: 0 %
Lymphocytes Relative: 26 %
Lymphs Abs: 2 10*3/uL (ref 0.7–4.0)
MCH: 30.5 pg (ref 26.0–34.0)
MCHC: 34.2 g/dL (ref 30.0–36.0)
MCV: 89.2 fL (ref 80.0–100.0)
Monocytes Absolute: 0.8 10*3/uL (ref 0.1–1.0)
Monocytes Relative: 11 %
Neutro Abs: 4.7 10*3/uL (ref 1.7–7.7)
Neutrophils Relative %: 59 %
Platelets: 212 10*3/uL (ref 150–400)
RBC: 4.43 MIL/uL (ref 4.22–5.81)
RDW: 12.5 % (ref 11.5–15.5)
WBC: 7.9 10*3/uL (ref 4.0–10.5)
nRBC: 0 % (ref 0.0–0.2)

## 2022-01-27 LAB — URINALYSIS, ROUTINE W REFLEX MICROSCOPIC
Bilirubin Urine: NEGATIVE
Glucose, UA: 50 mg/dL — AB
Ketones, ur: NEGATIVE mg/dL
Nitrite: NEGATIVE
Protein, ur: 100 mg/dL — AB
Specific Gravity, Urine: 1.018 (ref 1.005–1.030)
pH: 5 (ref 5.0–8.0)

## 2022-01-27 LAB — LIPASE, BLOOD: Lipase: 62 U/L — ABNORMAL HIGH (ref 11–51)

## 2022-01-27 IMAGING — CT CT ABD-PELV W/ CM
2 of 5 series · 15 of 46 positions shown, 17 images · IV contrast (agent unspecified)
Comparison: Chest, abdomen and pelvis CT with contrast [DATE],
abdomen and pelvis CT with contrast [DATE].

CLINICAL DATA: Intermittent abdominal pain for 3 days. History of
colon cancer.

EXAM:
CT ABDOMEN AND PELVIS WITH CONTRAST
TECHNIQUE: Multidetector CT imaging of the abdomen and pelvis was performed
using the standard protocol following bolus administration of
intravenous contrast.

[Series 2: axial st · axial · 0.87mm/px · z∈[-664,-199]mm · 12 of 111 slices shown, 14 images]
[im 9/111  soft-tissue]
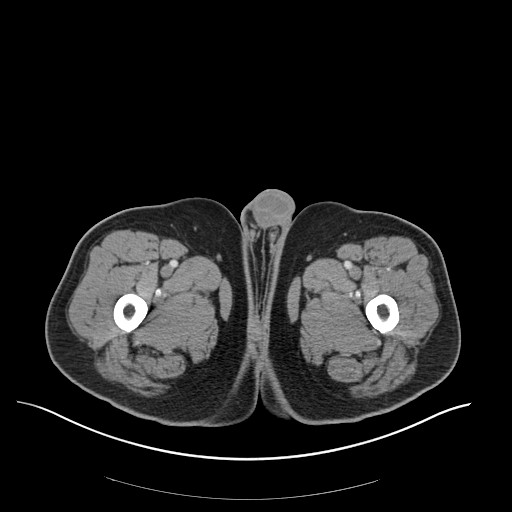
[im 9/111  bone]
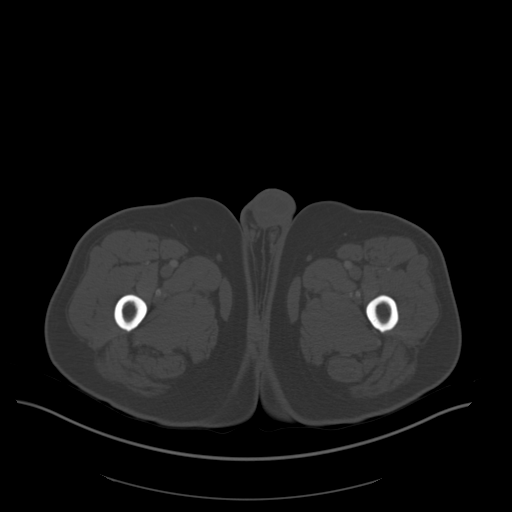
[im 17/111  soft-tissue]
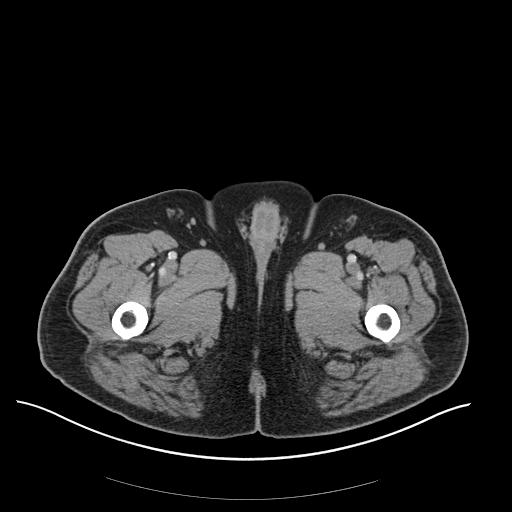
[im 26/111  soft-tissue]
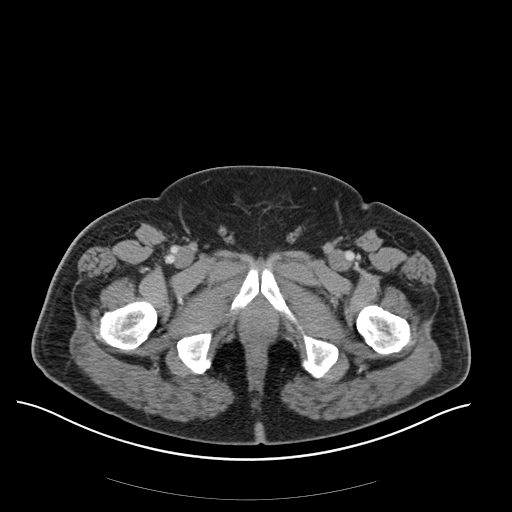
[im 34/111  soft-tissue]
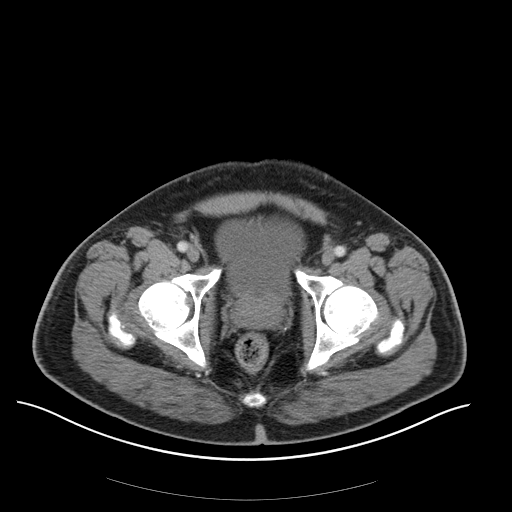
[im 43/111  soft-tissue]
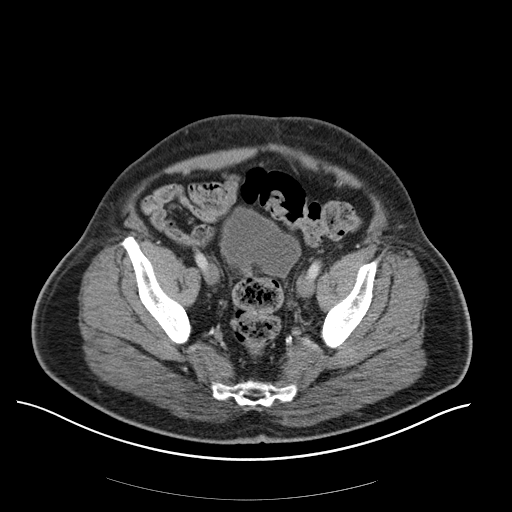
[im 51/111  soft-tissue]
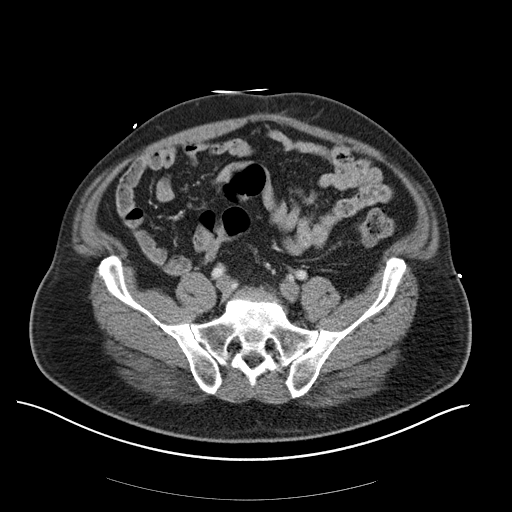
[im 60/111  soft-tissue]
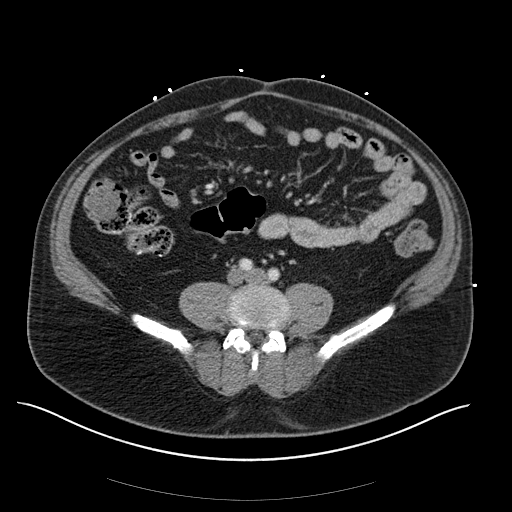
[im 68/111  soft-tissue]
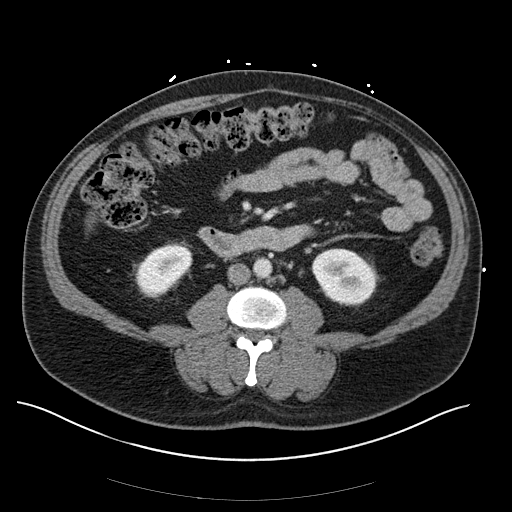
[im 77/111  soft-tissue]
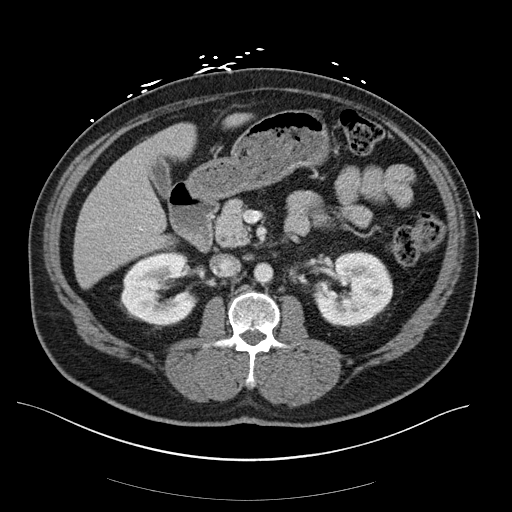
[im 77/111  bone]
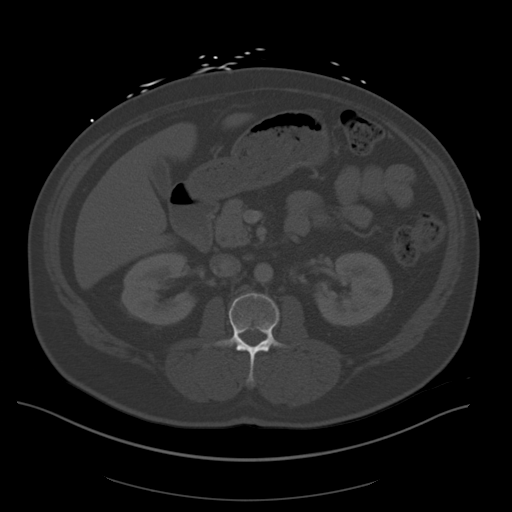
[im 85/111  soft-tissue]
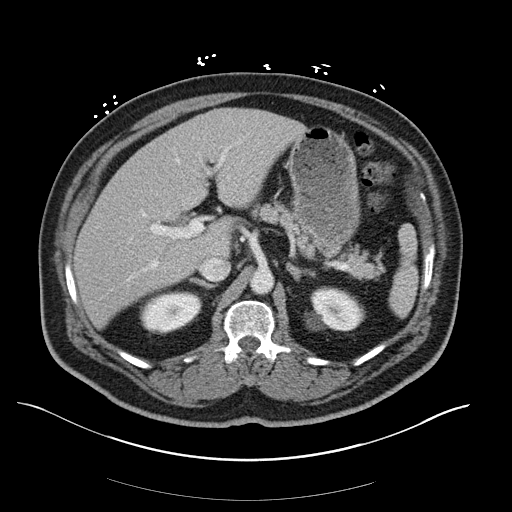
[im 94/111  soft-tissue]
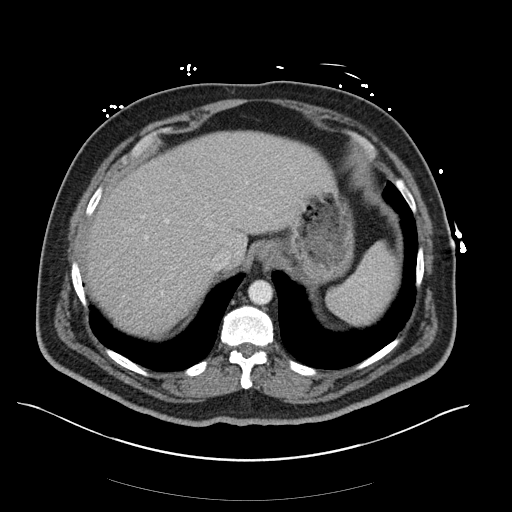
[im 102/111  soft-tissue]
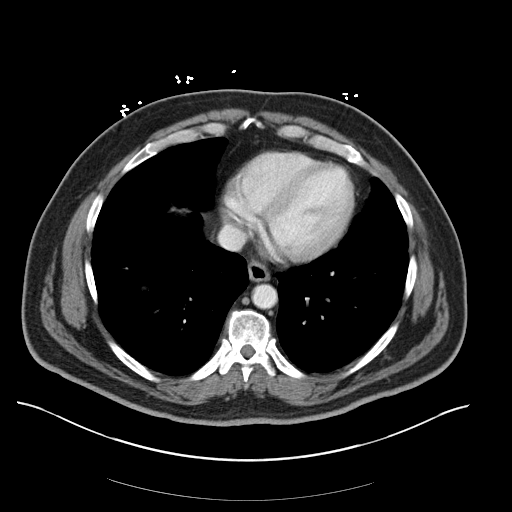

[Series 4: coronal st · coronal · 0.90mm/px · 3 of 156 slices shown]
[im 52/156  soft-tissue]
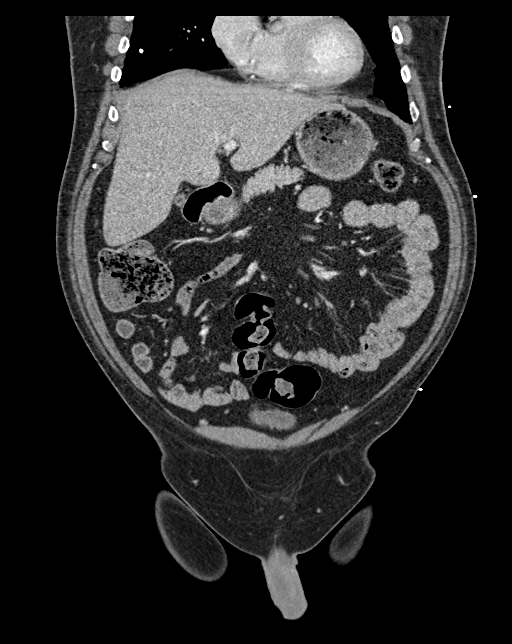
[im 69/156  soft-tissue]
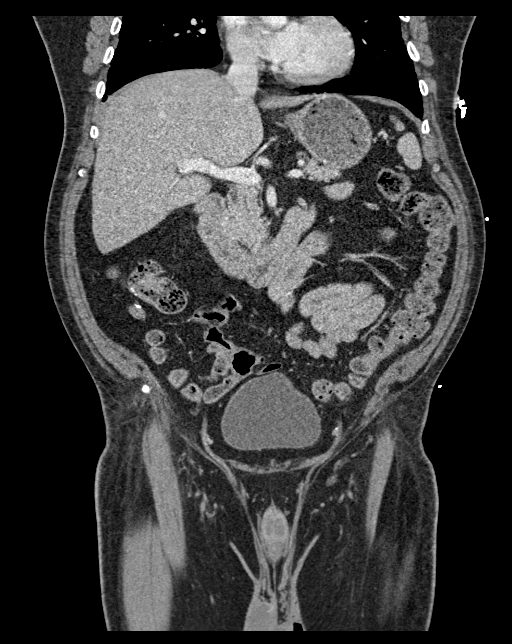
[im 87/156  soft-tissue]
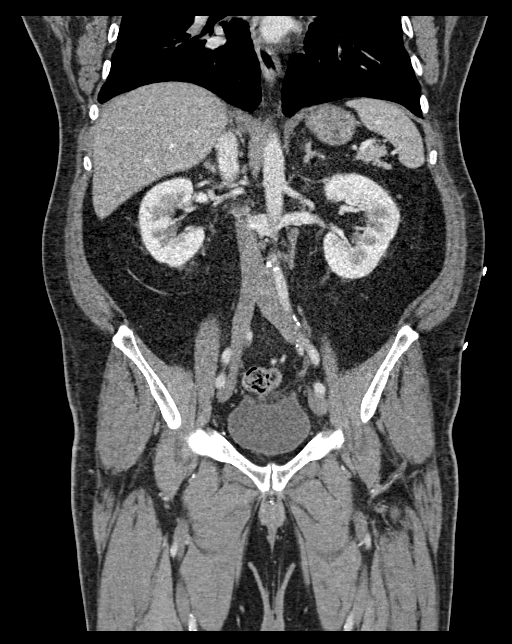

[15 of 46 positions shown; findings below may reference images not displayed]

RADIATION DOSE REDUCTION: This exam was performed according to the
departmental dose-optimization program which includes automated
exposure control, adjustment of the mA and/or kV according to
patient size and/or use of iterative reconstruction technique.

CONTRAST:  100mL OMNIPAQUE IOHEXOL 300 MG/ML  SOLN
FINDINGS: Lower chest: There is minimal scattered subpleural reticulation both
lung bases. Calcified granulomas right middle and left lower lobes.
No lung base infiltrate. Cardiac size is normal. There are
three-vessel coronary artery calcifications. No pericardial
effusion.

Hepatobiliary: 19 cm length mildly steatotic liver without mass
enhancement. Small stable flash filling hemangioma left lobe
anteriorly.

The gallbladder is contracted and not well seen. No biliary
dilatation or pericholecystic edema is evident , no calcified
gallstones.

Pancreas: Unremarkable.

Spleen: No mass enhancement or splenomegaly.

Adrenals/Urinary Tract: There is no adrenal mass. There is a 2 cm
cyst of the superior pole left kidney without other focal renal
cortical abnormality. There are no stones or hydronephrosis. No
bladder thickening is seen.

Stomach/Bowel: Partial right hemicolectomy with a primary ileocolic
surgical anastomosis is again noted, mild-to-moderate stool
retention in the transverse and proximal descending colon is seen
with fecal back up into the terminal ileum.

No gastric or small bowel dilatation or wall thickening is seen
apart from mild mucosal thickening in some of the left abdominal
small bowel. There is no mesenteric inflammatory change. Scattered
sigmoid diverticula without diverticulitis.

Vascular/Lymphatic: Circumaortic left renal vein. There is mild
aortoiliac atherosclerosis without AAA. No adenopathy is seen in the
abdomen or pelvis.

Reproductive: Enlarged prostate, 5 cm with bladder base impression,
unchanged.

Other: Small left paraumbilical fat hernia. There is no free air,
hemorrhage or fluid.

Musculoskeletal: No worrisome regional bone lesion is seen. There
are few bone islands in the lumbar spine. Degenerative disc disease
with spondylosis and vacuum phenomenon, L5-S1.
IMPRESSION: 1. Evidence of a nonspecific left abdominal small bowel enteritis,
without obstructive or inflammatory changes.
2. Constipation and diverticulosis. Old partial right hemicolectomy.
3. Mild hepatic enlargement and steatosis.
4. Coronary artery and aortic atherosclerosis.
5. Prostatomegaly.

## 2022-01-27 MED ORDER — DIPHENHYDRAMINE HCL 50 MG/ML IJ SOLN
50.0000 mg | Freq: Once | INTRAMUSCULAR | Status: AC
  Administered 2022-01-27: 50 mg via INTRAVENOUS
  Filled 2022-01-27: qty 1

## 2022-01-27 MED ORDER — IOHEXOL 300 MG/ML  SOLN
100.0000 mL | Freq: Once | INTRAMUSCULAR | Status: AC | PRN
  Administered 2022-01-27: 100 mL via INTRAVENOUS

## 2022-01-27 NOTE — ED Provider Notes (Signed)
[facility] DEPT Provider Note   CSN: [id number] Arrival date & time: 01/27/22  1318     History  Chief Complaint  Patient presents with   Abdominal Pain   Interview in further discussion with this patient assisted by Spanish interpreter [phone] and [phone].  Blake Vaughn is a 61 y.o. male who presents with concern for 3 days of intermittent bilateral lower abdominal pain without nausea, vomiting, or diarrhea.  Pain is similar to gas pain and comes and goes.  He denies any cramping type pain or persistent pain.  Denies any urinary symptoms, melena, or hematochezia.  Patient states that he presents only because he has history of colon cancer and was directed to come to the emergency department height by his oncologist should he develop recurrent abdominal pain.  I have personally reviewed patient's medical records.  He is history of follicular lymphoma, type 2 diabetes, colon cancer status post right hemicolectomy, hypercholesterolemia, and history of GI bleed.  He is not anticoagulated.  HPI     Home Medications Prior to Admission medications   Medication Sig Start Date End Date Taking? Authorizing Provider  Accu-Chek Softclix Lancets lancets Use as instructed 07/30/19   [name], [name], DO  Blood Glucose Monitoring Suppl (ACCU-CHEK AVIVA PLUS) w/Device KIT Inject 1 kit into the skin 2 (two) times daily. 01/03/20   Daisy Floro, DO  diphenhydrAMINE (BENADRYL) 50 MG tablet Take 1 tablet (50 mg total) by mouth once for 1 dose. Take 1 tablet 1 hour before CT scan 09/18/21 09/18/21  [name], MD  glipiZIDE (GLUCOTROL XL) 10 MG 24 hr tablet Take 1 tablet by mouth once daily with breakfast 10/23/21   Lilland, Alana, DO  glucose blood (ACCU-CHEK AVIVA PLUS) test strip Use as instructed 07/30/19   [name], [name], DO  lidocaine-prilocaine (EMLA) cream Apply 1 application topically as needed. 12/05/19   [name], MD  metFORMIN (GLUCOPHAGE) 1000 MG  tablet Take 1 tablet (1,000 mg total) by mouth 2 (two) times daily with a meal. 01/22/21   Lilland, Alana, DO  sitaGLIPtin (JANUVIA) 50 MG tablet Take 1 tablet (50 mg total) by mouth daily. 01/22/21   Lilland, Alana, DO      Allergies    Iohexol    Review of Systems   Review of Systems  Gastrointestinal:  Positive for abdominal pain.  All other systems reviewed and are negative.  Physical Exam Updated Vital Signs BP (!) 146/78    Pulse 69    Temp 98.2 F (36.8 C) (Oral)    Resp (!) 22    Ht '5\' 2"'  (1.575 m)    Wt 88.9 kg    SpO2 97%    BMI 35.85 kg/m  Physical Exam Vitals and nursing note reviewed.  Constitutional:      Appearance: He is obese. He is not ill-appearing or toxic-appearing.  HENT:     Head: Normocephalic and atraumatic.     Mouth/Throat:     Mouth: Mucous membranes are moist.     Pharynx: No oropharyngeal exudate or posterior oropharyngeal erythema.  Eyes:     General:        Right eye: No discharge.        Left eye: No discharge.     Extraocular Movements: Extraocular movements intact.     Conjunctiva/sclera: Conjunctivae normal.     Pupils: Pupils are equal, round, and reactive to light.  Cardiovascular:     Rate and Rhythm: Normal rate and regular rhythm.  Pulses: Normal pulses.     Heart sounds: Normal heart sounds. No murmur heard. Pulmonary:     Effort: Pulmonary effort is normal. No respiratory distress.     Breath sounds: Normal breath sounds. No wheezing or rales.  Abdominal:     General: Bowel sounds are normal. There is no distension.     Palpations: Abdomen is soft.     Tenderness: There is abdominal tenderness in the right lower quadrant, periumbilical area and left lower quadrant. There is no right CVA tenderness, left CVA tenderness, guarding or rebound.     Hernia: A hernia is present. Hernia is present in the ventral area.     Comments: Mild abdominal tenderness palpation in the lower quadrants and periumbilical area without  rebound/guarding.  Musculoskeletal:        General: No deformity.     Cervical back: Neck supple.  Skin:    General: Skin is warm and dry.     Capillary Refill: Capillary refill takes less than 2 seconds.  Neurological:     General: No focal deficit present.     Mental Status: He is alert. Mental status is at baseline.  Psychiatric:        Mood and Affect: Mood normal.    ED Results / Procedures / Treatments   Labs (all labs ordered are listed, but only abnormal results are displayed) Labs Reviewed  COMPREHENSIVE METABOLIC PANEL - Abnormal; Notable for the following components:      Result Value   Glucose, Bld 182 (*)    BUN 30 (*)    Total Protein 6.3 (*)    All other components within normal limits  LIPASE, BLOOD - Abnormal; Notable for the following components:   Lipase 62 (*)    All other components within normal limits  URINALYSIS, ROUTINE W REFLEX MICROSCOPIC - Abnormal; Notable for the following components:   Glucose, UA 50 (*)    Hgb urine dipstick SMALL (*)    Protein, ur 100 (*)    Leukocytes,Ua SMALL (*)    Bacteria, UA RARE (*)    All other components within normal limits  CBC WITH DIFFERENTIAL/PLATELET    EKG None  Radiology CT ABDOMEN PELVIS W CONTRAST  Result Date: 01/27/2022 CLINICAL DATA:  Intermittent abdominal pain for 3 days. History of colon cancer. EXAM: CT ABDOMEN AND PELVIS WITH CONTRAST TECHNIQUE: Multidetector CT imaging of the abdomen and pelvis was performed using the standard protocol following bolus administration of intravenous contrast. RADIATION DOSE REDUCTION: This exam was performed according to the departmental dose-optimization program which includes automated exposure control, adjustment of the mA and/or kV according to patient size and/or use of iterative reconstruction technique. CONTRAST:  149m OMNIPAQUE IOHEXOL 300 MG/ML  SOLN COMPARISON:  Chest, abdomen and pelvis CT with contrast 09/24/2021, abdomen and pelvis CT with contrast  09/07/2020. FINDINGS: Lower chest: There is minimal scattered subpleural reticulation both lung bases. Calcified granulomas right middle and left lower lobes. No lung base infiltrate. Cardiac size is normal. There are three-vessel coronary artery calcifications. No pericardial effusion. Hepatobiliary: 19 cm length mildly steatotic liver without mass enhancement. Small stable flash filling hemangioma left lobe anteriorly. The gallbladder is contracted and not well seen. No biliary dilatation or pericholecystic edema is evident , no calcified gallstones. Pancreas: Unremarkable. Spleen: No mass enhancement or splenomegaly. Adrenals/Urinary Tract: There is no adrenal mass. There is a 2 cm cyst of the superior pole left kidney without other focal renal cortical abnormality. There are no stones or  hydronephrosis. No bladder thickening is seen. Stomach/Bowel: Partial right hemicolectomy with a primary ileocolic surgical anastomosis is again noted, mild-to-moderate stool retention in the transverse and proximal descending colon is seen with fecal back up into the terminal ileum. No gastric or small bowel dilatation or wall thickening is seen apart from mild mucosal thickening in some of the left abdominal small bowel. There is no mesenteric inflammatory change. Scattered sigmoid diverticula without diverticulitis. Vascular/Lymphatic: Circumaortic left renal vein. There is mild aortoiliac atherosclerosis without AAA. No adenopathy is seen in the abdomen or pelvis. Reproductive: Enlarged prostate, 5 cm with bladder base impression, unchanged. Other: Small left paraumbilical fat hernia. There is no free air, hemorrhage or fluid. Musculoskeletal: No worrisome regional bone lesion is seen. There are few bone islands in the lumbar spine. Degenerative disc disease with spondylosis and vacuum phenomenon, L5-S1. IMPRESSION: 1. Evidence of a nonspecific left abdominal small bowel enteritis, without obstructive or inflammatory  changes. 2. Constipation and diverticulosis. Old partial right hemicolectomy. 3. Mild hepatic enlargement and steatosis. 4. Coronary artery and aortic atherosclerosis. 5. Prostatomegaly. Electronically Signed   By: Telford Nab M.D.   On: 01/27/2022 22:28    Procedures Procedures    Medications Ordered in ED Medications  diphenhydrAMINE (BENADRYL) injection 50 mg (50 mg Intravenous Given 01/27/22 2114)  iohexol (OMNIPAQUE) 300 MG/ML solution 100 mL (100 mLs Intravenous Contrast Given 01/27/22 2156)    ED Course/ Medical Decision Making/ A&P                           Medical Decision Making 61 year old male with history of colon cancer presents with concern for abdominal pain x3 days.  Hypertensive on intake, vital signs otherwise normal.  Cardiopulmonary exam is normal.  Abdominal exam with very mild tenderness palpation in the periumbilical area and lower quadrants bilaterally without focal tenderness or rebound/guarding.  No CVAT.  Neurovascular intact all 4 extremities and well-appearing.  Differential diagnosis includes but is not limited to bowel obstruction, mass, enteritis, constipation, diverticulitis, appendicitis, nephrolithiasis.  Amount and/or Complexity of Data Reviewed Labs:     Details: CBC without leukocytosis or anemia.  CMP unremarkable.  Lipase is very mildly elevated to 62.  UA with small hemoglobinuria, proteinuria, but no concern for infectious etiology at this time. Radiology: ordered.    Details: CT of the abdomen pelvis obtained with premedication with IV Benadryl.  Patient with history of localized urticarial reaction several years ago but has undergone multiple contrasted IV studies since that time with only Benadryl premedication without reaction.  He discussed with on-call radiologist that she was agreeable to proceeding as planned with IV Benadryl immediately prior to scan.  CT of the abdomen pelvis revealed nonspecific left abdominal bowel enteritis without  obstructive or inflammatory changes.  There is constipation and diverticulosis without acute diverticulitis.  Risk Prescription drug management.   Clinical picture and work-up today most consistent with constipation versus developing viral etiology for patient's symptoms.  Clinical concern for underlying emergent etiology that would warrant further ED work-up or inpatient management is low. Qu[place]ious voiced understanding his medical evaluation and treatment plan.  Each of his questions was answered to his expressed satisfaction.  Return precautions given.  Patient is well-appearing, stable, and was discharged in good condition.  This chart was dictated using voice recognition software, Dragon. Despite the best efforts of this provider to proofread and correct errors, errors may still occur which can change documentation meaning.   Final Clinical  Impression(s) / ED Diagnoses Final diagnoses:  Constipation, unspecified constipation type    Rx / DC Orders ED Discharge Orders     None         Aura Dials 01/28/22 0015    Godfrey Pick, MD 01/28/22 [phone]

## 2022-01-27 NOTE — ED Triage Notes (Signed)
Patient c/o  intermittent abdominal pain x 3 days Patient denies any N/v/D. Patient has a history of colon cancer. Patient states he has been taking Tylenol at home for the pain.

## 2022-01-27 NOTE — ED Notes (Signed)
Pt returned from CT °

## 2022-01-27 NOTE — ED Provider Triage Note (Signed)
Emergency Medicine Provider Triage Evaluation Note  Blake Vaughn , a 61 y.o. male  was evaluated in triage.  Pt complains of abdominal pain x3 days.  Patient has a history of colon cancer and is followed by Dr. Morey Hummingbird and now receives a CT scan every 3 months.  Has been taking Tylenol at home with no relief in his symptoms.  Denies nausea, vomiting, diarrhea, blood in stool, fever, abdominal bloating, chills, dysuria, hematuria.  Review of Systems  Positive: As per HPI above Negative:  Physical Exam  BP (!) 156/76 (BP Location: Left Arm)    Pulse 83    Temp 98.2 F (36.8 C) (Oral)    Resp 18    Ht 5\' 2"  (1.575 m)    Wt 88.9 kg    SpO2 98%    BMI 35.85 kg/m  Gen:   Awake, no distress   Resp:  Normal effort  MSK:   Moves extremities without difficulty  Other:  Abdominal distention with mild tenderness to palpation noted to the lower abdominal region.  Medical Decision Making  Medically screening exam initiated at 2:38 PM.  Appropriate orders placed.  Blake Vaughn was informed that the remainder of the evaluation will be completed by another provider, this initial triage assessment does not replace that evaluation, and the importance of remaining in the ED until their evaluation is complete.   Christoffer Currier A, [name] 01/27/22 1444

## 2022-01-27 NOTE — Discharge Instructions (Addendum)
Your CT scan is reassuring.  It did not show anything dangerous occurring in your abdomen at this time.  You do appear to be somewhat constipated.  You may try warm prune juice or over-the-counter MiraLAX as needed.  Return to the ER with any severe symptoms follow-up closely with your outpatient oncology team

## 2022-03-07 DIAGNOSIS — H34812 Central retinal vein occlusion, left eye, with macular edema: Secondary | ICD-10-CM | POA: Diagnosis not present

## 2022-03-25 ENCOUNTER — Other Ambulatory Visit: Payer: Self-pay | Admitting: Family Medicine

## 2022-03-25 DIAGNOSIS — E1165 Type 2 diabetes mellitus with hyperglycemia: Secondary | ICD-10-CM

## 2022-04-05 ENCOUNTER — Other Ambulatory Visit: Payer: Self-pay | Admitting: Family Medicine

## 2022-04-05 DIAGNOSIS — E1165 Type 2 diabetes mellitus with hyperglycemia: Secondary | ICD-10-CM

## 2022-04-07 ENCOUNTER — Inpatient Hospital Stay: Payer: Medicare Other | Admitting: Hematology

## 2022-04-07 ENCOUNTER — Inpatient Hospital Stay: Payer: Medicare Other | Attending: Family Medicine

## 2022-04-21 ENCOUNTER — Ambulatory Visit (HOSPITAL_COMMUNITY)
Admission: RE | Admit: 2022-04-21 | Discharge: 2022-04-21 | Disposition: A | Discharge status: Home / Self Care | Payer: Medicare Other | Source: Ambulatory Visit | Attending: Internal Medicine | Admitting: Internal Medicine

## 2022-04-21 ENCOUNTER — Ambulatory Visit (INDEPENDENT_AMBULATORY_CARE_PROVIDER_SITE_OTHER): Payer: Medicare Other

## 2022-04-21 ENCOUNTER — Encounter (HOSPITAL_COMMUNITY): Payer: Self-pay

## 2022-04-21 VITALS — BP 151/81 | HR 67 | Temp 98.1°F | Resp 18

## 2022-04-21 DIAGNOSIS — S0990XA Unspecified injury of head, initial encounter: Secondary | ICD-10-CM

## 2022-04-21 DIAGNOSIS — M25511 Pain in right shoulder: Secondary | ICD-10-CM

## 2022-04-21 DIAGNOSIS — M19011 Primary osteoarthritis, right shoulder: Secondary | ICD-10-CM | POA: Diagnosis not present

## 2022-04-21 IMAGING — DX DG SHOULDER 2+V*R*
4 series · 4 of 4 positions shown · non-contrast
Comparison: [DATE]

CLINICAL DATA: shoulder pain

EXAM:
RIGHT SHOULDER - 2+ VIEW

[shoulder ap]
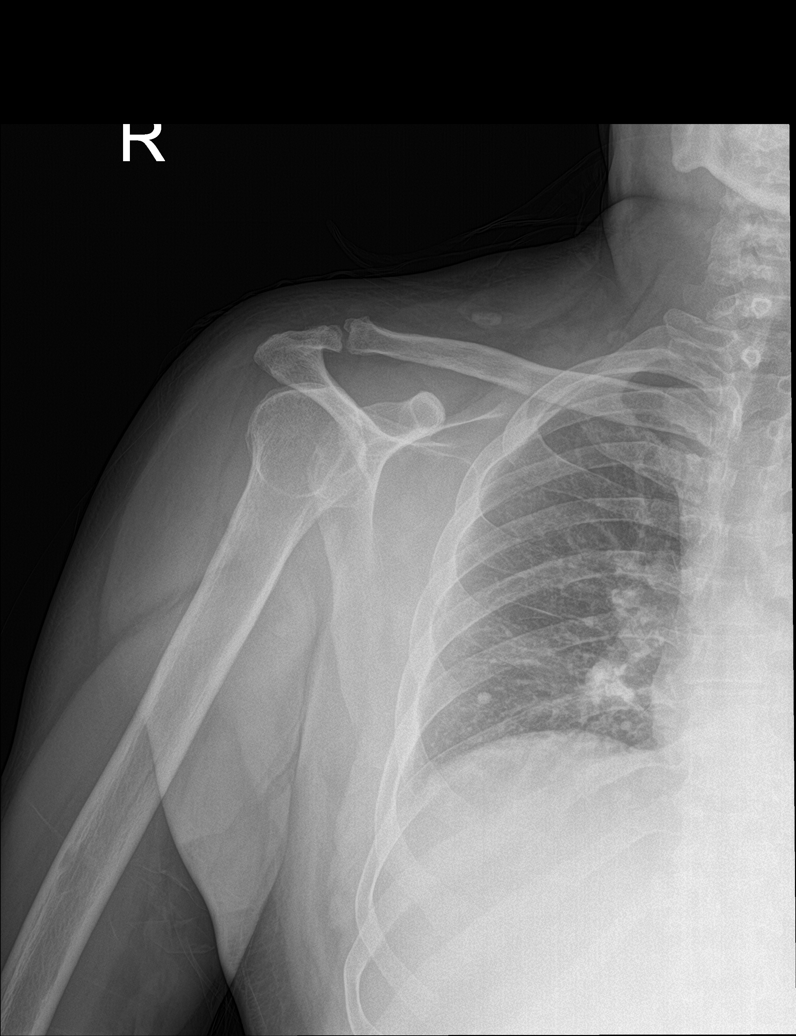

[shoulder grashey]
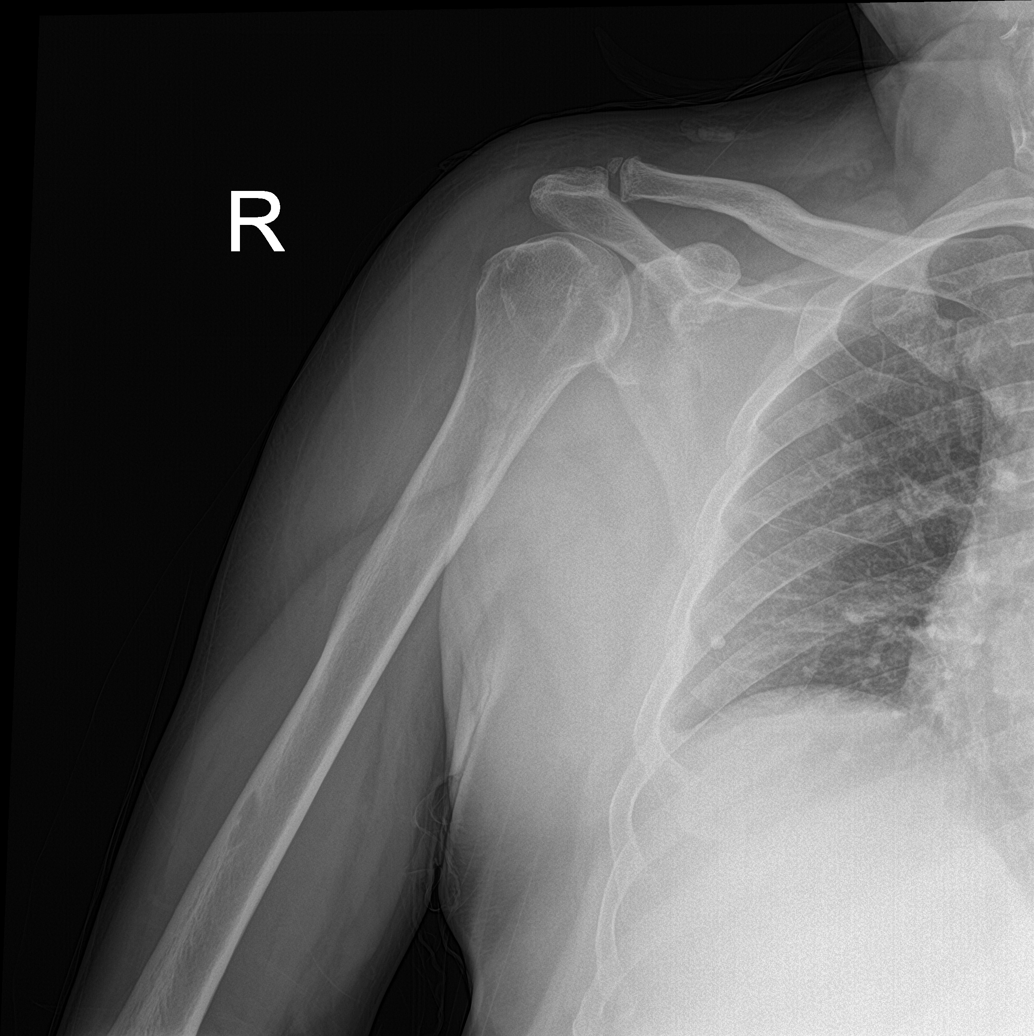

[shoulder y-view]
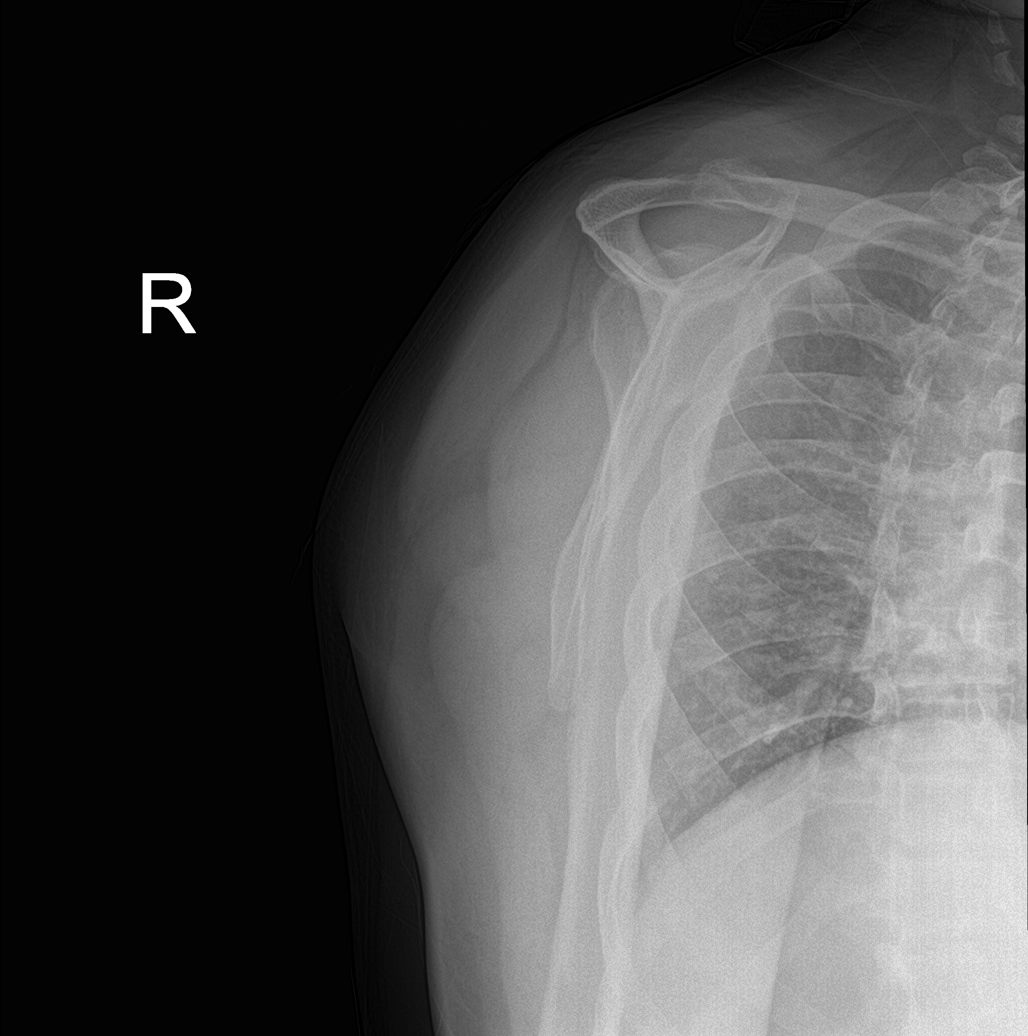

[shoulder axial]
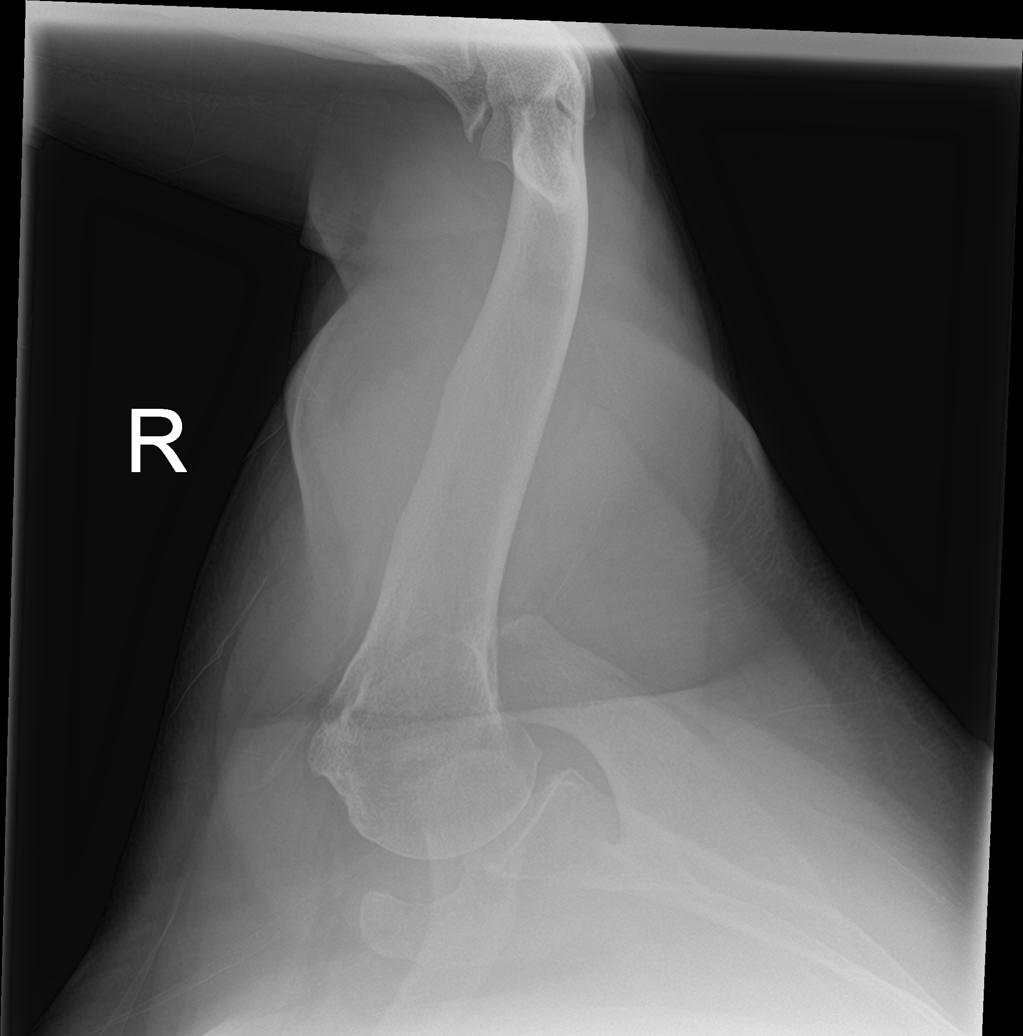

[4 of 4 positions shown; findings below may reference images not displayed]

FINDINGS: There is no evidence of fracture or dislocation. Again seen is the
mild generalized osteoarthritic change including the AC joint. There
has been interval removal of the right IJ Port-A-Cath. No focal bone
abnormality. Soft tissues are unremarkable.
IMPRESSION: Mild generalized osteoarthritis without significant interval change.

## 2022-04-21 MED ORDER — METHOCARBAMOL 500 MG PO TABS: 500.0000 mg | ORAL_TABLET | Freq: Two times a day (BID) | ORAL | 0 refills | Status: DC | PRN

## 2022-04-21 NOTE — Discharge Instructions (Signed)
Your x-ray shows arthritis in your shoulder which could be contributing to your symptoms.  Although, I do think that you are having a muscle strain in your back as well.  This is being treated with a muscle relaxer.  Please be advised that this can cause drowsiness.  Also may alternate ice and heat to affected area of pain.  Follow-up if symptoms persist or worsen.  You may follow-up with provided contact information for orthopedist if pain persists or worsens.  Recommend going to the ER for your head injury if headache becomes worse, you develop dizziness or blurred vision, or nausea vomiting occurs. ?

## 2022-04-21 NOTE — ED Triage Notes (Signed)
Pt is present today with right shoulder and rib pain. Pt states that the pain started yesterday. Denies any injury. Describes the pain as ' sharp" ?

## 2022-04-21 NOTE — ED Provider Notes (Signed)
?[place] ? ? ? ?CSN: [id number] ?Arrival date & time: 04/21/22  1442 ? ? ?  ? ?History   ?Chief Complaint ?Chief Complaint  ?Patient presents with  ? Shoulder Pain  ?  Right rib pain  ? ? ?HPI ?Blake Vaughn is a 60 y.o. male.  ? ?Patient presents with right shoulder pain that radiates into right upper back that has been present for a few days.  Denies any apparent injury to the area.  Patient reports that this occurred 1 time a few years prior.  Denies any numbness or tingling.  Range of motion is limited due to pain.  Patient has taken Tylenol for pain with minimal improvement.  Patient also states that he hit his head on the shower door yesterday and is having pain in the lateral left portion of the head.  He states that he was bent over drying his head with a towel when he lifted up and hit the left side of his head on the shower.  Denies any associated dizziness, blurred vision, nausea, vomiting. Patient does not take blood thinners.  ? ? ?Shoulder Pain ? ?Past Medical History:  ?Diagnosis Date  ? Appendicitis 10/21/2018  ? Diabetes mellitus   ? 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 Qualifier: Diagnosis of  By: [name], [name]   ? GI bleed 11/05/2018  ? Hypercholesterolemia   ? Hypertension 11/27/2011  ? Lymphoma ([place])   ? gets annual chemo last tx Feb 2013  ? TOBACCO USE, QUIT 08/18/2009  ? Qualifier: Diagnosis of  By: Drue Flirt  MD, Merrily Brittle    ? ? ?Patient Active Problem List  ? Diagnosis Date Noted  ? Blurred vision 04/30/2021  ? Microalbuminuria 06/05/2020  ? Need for immunization against influenza 01/03/2020  ? 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 Right shoulder pain 08/16/2019  ? Bilateral shoulder pain 08/02/2019  ? Neuropathy 07/30/2019  ? Port-A-Cath in place 11/25/2018  ? 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 Syncope due to orthostatic hypotension 11/05/2018  ? Hypoalbuminemia 11/05/2018  ? Hypoproteinemia ([place]) 11/05/2018  ? Uses Spanish  as primary spoken language 10/23/2018  ? Obesity (BMI 30-39.9) 10/23/2018  ? Mass of cecum s/p right colectomy 10/21/2018 10/22/2018  ? Nocturnal leg cramps 07/16/2017  ? HTN (hypertension) 11/27/2011  ? Nonintractable headache 10/16/2011  ? Pure hypercholesterolemia 09/24/2009  ? 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 Diabetes type 2, uncontrolled 07/09/2009  ? 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/09/2009  ? 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 ? ?Past Surgical History:  ?Procedure Laterality Date  ? ESOPHAGOGASTRODUODENOSCOPY (EGD) WITH PROPOFOL N/A 11/05/2018  ? Procedure: ESOPHAGOGASTRODUODENOSCOPY (EGD) WITH PROPOFOL;  Surgeon: [name], MD;  Location: [facility];  Service: Endoscopy;  Laterality: N/A;  ? IR IMAGING GUIDED PORT INSERTION  11/24/2018  ? IR REMOVAL TUN ACCESS W/ PORT W/O FL MOD SED  11/11/2021  ? LAPAROSCOPIC APPENDECTOMY N/A 10/21/2018  ? Procedure: Exploratory laparotomy right hemicolectomy;  Surgeon: [name], MD;  Location: [place];  Service: General;  Laterality: N/A;  ? ? ? ? ? ?Home Medications   ? ?Prior to Admission medications   ?Medication Sig Start Date End Date Taking? Authorizing Provider  ?methocarbamol (ROBAXIN) 500 MG tablet Take 1 tablet (500 mg total) by mouth 2 (two) times daily as needed for muscle spasms. 04/21/22  Yes Teodora Medici, FNP  ?Accu-Chek Softclix Lancets lancets Use as instructed 07/30/19   [name], [name], DO  ?Blood  Glucose Monitoring Suppl (ACCU-CHEK AVIVA PLUS) w/Device KIT Inject 1 kit into the skin 2 (two) times daily. 01/03/20   Daisy Floro, DO  ?diphenhydrAMINE (BENADRYL) 50 MG tablet Take 1 tablet (50 mg total) by mouth once for 1 dose. Take 1 tablet 1 hour before CT scan 09/18/21 09/18/21  [name], MD  ?glipiZIDE (GLUCOTROL XL) 10 MG 24 hr tablet Take 1 tablet by mouth once daily with breakfast 03/26/22   Lilland, Alana, DO  ?glucose blood (ACCU-CHEK AVIVA PLUS) test strip Use as instructed 07/30/19   [name], [name], DO   ?JANUVIA 50 MG tablet Take 1 tablet by mouth once daily 04/07/22   Lilland, Alana, DO  ?lidocaine-prilocaine (EMLA) cream Apply 1 application topically as needed. 12/05/19   [name], MD  ?metFORMIN (GLUCOPHAGE) 1000 MG tablet Take 1 tablet (1,000 mg total) by mouth 2 (two) times daily with a meal. 01/22/21   Lilland, Alana, DO  ? ? ?Family History ?Family History  ?Problem Relation Age of Onset  ? Diabetes Mother   ? Diabetes Father   ? Renal Disease Brother   ? Diabetes Brother   ? Cancer Brother   ?     lymphoma   ? ? ?Social History ?Social History  ? ?Tobacco Use  ? Smoking status: Former  ?  Packs/day: 0.50  ?  Years: 30.00  ?  Pack years: 15.00  ?  Types: Cigarettes  ?  Quit date: 12/08/2004  ?  Years since quitting: 17.3  ? Smokeless tobacco: Former  ?  Quit date: 11/20/2006  ?Vaping Use  ? Vaping Use: Never used  ?Substance Use Topics  ? Alcohol use: No  ? Drug use: No  ? ? ? ?Allergies   ?Iohexol ? ? ?Review of Systems ?Review of Systems ?Per HPI ? ?Physical Exam ?Triage Vital Signs ?ED Triage Vitals  ?Enc Vitals Group  ?   BP 04/21/22 1526 (!) 151/81  ?   Pulse Rate 04/21/22 1526 67  ?   Resp 04/21/22 1526 18  ?   Temp 04/21/22 1527 98.1 ?F (36.7 ?C)  ?   Temp src --   ?   SpO2 04/21/22 1526 99 %  ?   Weight --   ?   Height --   ?   Head Circumference --   ?   Peak Flow --   ?   Pain Score 04/21/22 1525 8  ?   Pain Loc --   ?   Pain Edu? --   ?   Excl. in [place]? --   ? ?No data found. ? ?Updated Vital Signs ?BP (!) 151/81   Pulse 67   Temp 98.1 ?F (36.7 ?C)   Resp 18   SpO2 99%  ? ?Visual Acuity ?Right Eye Distance:   ?Left Eye Distance:   ?Bilateral Distance:   ? ?Right Eye Near:   ?Left Eye Near:    ?Bilateral Near:    ? ?Physical Exam ?Constitutional:   ?   General: He is not in acute distress. ?   Appearance: Normal appearance. He is not toxic-appearing or diaphoretic.  ?HENT:  ?   Head: Normocephalic and atraumatic.  ?Eyes:  ?   Extraocular Movements: Extraocular movements intact.  ?    Conjunctiva/sclera: Conjunctivae normal.  ?Pulmonary:  ?   Effort: Pulmonary effort is normal.  ?Musculoskeletal:  ?   Comments: Tenderness to palpation generalized throughout right shoulder.  No obvious swelling, discoloration, crepitus noted.  Also tenderness palpation to  right upper back and trapezius muscle.  Pain with range of motion at 90 degrees with shoulder abduction.  Grip strength is 5/5.  Neurovascular intact.  ?Skin: ?   Comments: No obvious abrasion, laceration, swelling noted to left lateral portion of the head.  There is tenderness to palpation to left lateral portion of head directly above the ear.  ?Neurological:  ?   General: No focal deficit present.  ?   Mental Status: He is alert and oriented to person, place, and time. Mental status is at baseline.  ?   Cranial Nerves: Cranial nerves 2-12 are intact.  ?   Sensory: Sensation is intact.  ?   Motor: Motor function is intact.  ?   Coordination: Coordination is intact.  ?   Gait: Gait is intact.  ?Psychiatric:     ?   Mood and Affect: Mood normal.     ?   Behavior: Behavior normal.     ?   Thought Content: Thought content normal.     ?   Judgment: Judgment normal.  ? ? ? ?UC Treatments / Results  ?Labs ?(all labs ordered are listed, but only abnormal results are displayed) ?Labs Reviewed - No data to display ? ?EKG ? ? ?Radiology ?DG Shoulder Right ? ?Result Date: 04/21/2022 ?CLINICAL DATA:  shoulder pain EXAM: RIGHT SHOULDER - 2+ VIEW COMPARISON:  August 02, 2019 FINDINGS: There is no evidence of fracture or dislocation. Again seen is the mild generalized osteoarthritic change including the [facility] joint. There has been interval removal of the right IJ Port-A-Cath. No focal bone abnormality. Soft tissues are unremarkable. 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 Electronically Signed   By: Frazier Richards M.D.   On: 04/21/2022 16:16   ? ?Procedures ?Procedures (including critical care time) ? ?Medications Ordered in  UC ?Medications - No data to display ? ?Initial Impression / Assessment and Plan / UC Course  ?I have reviewed the triage vital signs and the nursing notes. ? ?Pertinent labs & imaging results that were available during my c

## 2022-05-06 ENCOUNTER — Other Ambulatory Visit: Payer: Self-pay

## 2022-05-15 ENCOUNTER — Other Ambulatory Visit: Payer: Self-pay

## 2022-06-03 ENCOUNTER — Other Ambulatory Visit: Payer: Self-pay

## 2022-06-03 DIAGNOSIS — C182 Malignant neoplasm of ascending colon: Secondary | ICD-10-CM

## 2022-06-03 NOTE — Progress Notes (Signed)
Blake Vaughn   Telephone:(336) [phone] Fax:([phone]) [phone]   Clinic Follow up Note   Patient Care Team: [name], DO as PCP - General (Family Medicine) [name], MD as Consulting Physician (Colon and Rectal Surgery) [name], MD as Consulting Physician (Hematology) 06/04/2022  CHIEF COMPLAINT: Colon cancer  SUMMARY OF ONCOLOGIC HISTORY: Oncology History Overview Note  Cancer Staging Cancer of right colon [facility]) Staging form: Colon and Rectum, AJCC 8th Edition - Pathologic stage from 10/21/2018: Stage IVC (pT4b, pN1a, pM1c) - Signed by [name], MD on 11/13/2018 Total positive nodes: 1 Histologic grading system: 4 grade system Histologic grade (G): G2 Residual tumor (R): R0 - None Sites of metastasis: Peritoneum Lymph-vascular invasion (LVI): LVI present/identified, NOS  Follicular lymphoma ([place]), History of Staging form: Lymphoid Neoplasms, AJCC 6th Edition - Clinical: Stage II - Signed by Curt Bears, MD on 2/95/2841     Follicular lymphoma [facility]), History of  11/2009 Initial Diagnosis   Follicular lymphoma ([place]), History of    Chemotherapy   1) Status post 6 cycles of systemic chemotherapy with CHOP/Rituxan last dose given 05/01/2009.  2) Maintenance Rituxan at 375 mg per meter square given every 2 months status post 12 cycles    Cancer of right colon ([place])  10/21/2018 Surgery   Exploratory laparotomy right hemicolectomy by Dr. [name] and Dr. [name]  10/21/18   10/21/2018 Pathology Results   Diagnosis 10/21/18 Colon, segmental resection for tumor, right ascending and appendix - ADENOCARCINOMA, MODERATE TO POORLY DIFFERENTIATED (4 CM) - METASTATIC CARCINOMA INVOLVING ONE OF EIGHTEEN LYMPH NODES (1/18) - CARCINOMA EXTENDS INTO THE APPENDIX - TWO TUMOR DEPOSITS PRESENT - SEE ONCOLOGY TABLE AND COMMENT BELOW   10/21/2018 Cancer Staging   Staging form: Colon and Rectum, AJCC 8th Edition - Pathologic stage from 10/21/2018: Stage  IVC (pT4b, pN1a, pM1c) - Signed by [name], MD on 11/13/2018   11/04/2018 Imaging   CT AP W Contrast 11/04/18  IMPRESSION: 1. Interval appendectomy. There is residual soft tissue thickening and stranding in the right lower quadrant adjacent to the cecum which may represent ongoing inflammation versus postsurgical changes. A small soft tissue density adjacent to the surgical sutures may reflect small hematoma or operative collection. No large focal fluid collection to suggest drainable abscess allowing for absence of contrast 2. Fluid-filled colon without wall thickening, could reflect diarrheal process   11/08/2018 Imaging   CT Chest W Contrast 11/08/18  IMPRESSION: 1. No acute findings are noted in the thorax to account for the patient's symptoms. 2. Aortic atherosclerosis, in addition to left main and 3 vessel coronary artery disease. Please note that although the presence of coronary artery calcium documents the presence of coronary artery disease, the severity of this disease and any potential stenosis cannot be assessed on this non-gated CT examination. Assessment for potential risk factor modification, dietary therapy or pharmacologic therapy may be warranted, if clinically indicated. 3. Additional incidental findings, as above. Aortic Atherosclerosis (ICD10-I70.0).   11/12/2018 Initial Diagnosis   Cancer of right colon ([place])   11/25/2018 - 05/05/2019 Chemotherapy   FOLFOX q2weeks starting 11/25/18. Due to moderate thrombocytopenia, I will stop 5-FU bolus, and reduce pump infusion to 2220m/m2 starting with cycle 7.  Plan for last treatment on 05/05/19.    02/10/2019 Imaging   CT AP W Contrast   IMPRESSION: 1. No evidence metastatic disease. 2. Hepatic steatosis. 3. Mildly enlarged prostate. 4. Aortic atherosclerosis (ICD10-170.0). coronary artery calcification.   09/02/2019 Imaging   CT AP W Contrast  IMPRESSION: 1. No signs to suggest metastatic disease in the abdomen  or pelvis. 2. Aortic atherosclerosis these, as well as right coronary artery disease.   03/01/2020 Imaging   CT CAP W Contrast  IMPRESSION: 1. Stable exam. No new or progressive findings to suggest recurrent or metastatic disease. 2. Status post right hemicolectomy. 3. Aortic Atherosclerosis (ICD10-I70.0).   09/07/2020 Imaging   CT AP  IMPRESSION: 1. Status post right hemicolectomy, without recurrent or metastatic disease. 2. Coronary artery atherosclerosis. Aortic Atherosclerosis (ICD10-I70.0).   09/24/2021 Imaging   CT CAP  IMPRESSION: 1. Status post partial right hemicolectomy and ileocolic anastomosis. 2. No evidence of recurrent or metastatic disease in the chest, abdomen, or pelvis. 3. Mild, diffuse bilateral bronchial wall thickening, consistent with nonspecific infectious or inflammatory bronchitis. 4. Hepatic steatosis. 5. Prostatomegaly. Thickening of the decompressed urinary bladder, likely secondary to chronic outlet obstruction. 6. Coronary artery disease.   Aortic Atherosclerosis (ICD10-I70.0).     CURRENT THERAPY: Surveillance  INTERVAL HISTORY: Blake Vaughn returns for follow-up as scheduled, last seen by Dr. Burr Medico 10/07/2021.  He presented to ED 10/09/2021 for palpitations and headache, CT head showed 13 mm hypodensity in the left parietal subcortical white matter which showed mild growth since 2017 and 2022 CTs.  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  MR venogram same day showed no corresponding hyperdensity at the location on the earlier head CT and no abnormality on the image. Port removed by IR 11/11/2021.  He developed abdominal pain and went to ED, CT AP with contrast 01/27/2022 showed left abdominal small bowel enteritis without obstruction, constipation, and mild hepatic enlargement and steatosis.  He was diagnosed with  constipation.  Most recently presented to ED 04/21/2022 with right shoulder pain, x-ray showed OA.  Today he presents by himself, he feels well overall.  Energy and appetite are normal, no significant weight loss.  Bowels moving normally, denies blood in stool.  He has intermittent "heat in the stomach" but not bad.  This occasionally wakes him up at night and last 2-3 minutes, resolves once he drinks milk.  This started 5 months after he completed chemo.  Denies new or worsening abdominal pain, bloating.  He has persistent numbness and tingling in the fingers and toes, occasionally drops things and feels off balance but no fall.  He has not seen a provider lately for his DM but gets meds from a clinic every 3 months.  All other systems were reviewed with the patient and are negative.  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   Qualifier: Diagnosis of  By: [name], [name]    GI bleed 11/05/2018   Hypercholesterolemia    Hypertension 11/27/2011   Lymphoma ([place])    gets annual chemo last tx Feb 2013   TOBACCO USE, QUIT 08/18/2009   Qualifier: Diagnosis of  By: Drue Flirt  MD, Merrily Brittle      SURGICAL HISTORY: Past Surgical History:  Procedure Laterality Date   ESOPHAGOGASTRODUODENOSCOPY (EGD) WITH PROPOFOL N/A 11/05/2018   Procedure: ESOPHAGOGASTRODUODENOSCOPY (EGD) WITH PROPOFOL;  Surgeon: [name], MD;  [facility];  Service: Endoscopy;  Laterality: N/A;   IR IMAGING GUIDED PORT INSERTION  11/24/2018   IR REMOVAL TUN ACCESS W/ PORT W/O FL MOD SED  11/11/2021  LAPAROSCOPIC APPENDECTOMY N/A 10/21/2018   Procedure: Exploratory laparotomy right hemicolectomy;  Surgeon: [name], MD;  Location: [place];  Service: General;  Laterality: N/A;    I have reviewed the social history and family history with the patient and they are unchanged from previous  note.  ALLERGIES:  is allergic to iohexol.  MEDICATIONS:  Current Outpatient Medications  Medication Sig Dispense Refill   gabapentin (NEURONTIN) 300 MG capsule Take 1 capsule (300 mg total) by mouth at bedtime. 30 capsule 1   Accu-Chek Softclix Lancets lancets Use as instructed 100 each 12   Blood Glucose Monitoring Suppl (ACCU-CHEK AVIVA PLUS) w/Device KIT Inject 1 kit into the skin 2 (two) times daily. 1 kit 0   diphenhydrAMINE (BENADRYL) 50 MG tablet Take 1 tablet (50 mg total) by mouth once for 1 dose. Take 1 tablet 1 hour before CT scan 1 tablet 0   glipiZIDE (GLUCOTROL XL) 10 MG 24 hr tablet Take 1 tablet by mouth once daily with breakfast 90 tablet 0   glucose blood (ACCU-CHEK AVIVA PLUS) test strip Use as instructed 100 each 12   JANUVIA 50 MG tablet Take 1 tablet by mouth once daily 90 tablet 0   lidocaine-prilocaine (EMLA) cream Apply 1 application topically as needed. 30 g 1   metFORMIN (GLUCOPHAGE) 1000 MG tablet Take 1 tablet (1,000 mg total) by mouth 2 (two) times daily with a meal. 180 tablet 3   methocarbamol (ROBAXIN) 500 MG tablet Take 1 tablet (500 mg total) by mouth 2 (two) times daily as needed for muscle spasms. 20 tablet 0   No current facility-administered medications for this visit.    PHYSICAL EXAMINATION: ECOG PERFORMANCE STATUS: 1 - Symptomatic but completely ambulatory  Vitals:   06/04/22 0933  BP: 127/65  Pulse: 79  Resp: 18  Temp: 97.8 F (36.6 C)  SpO2: 99%   Filed Weights   06/04/22 0933  Weight: 191 lb 8 oz (86.9 kg)    GENERAL:alert, no distress and comfortable SKIN: No rash EYES: sclera clear NECK: Without mass LYMPH:  no palpable cervical or supraclavicular lymphadenopathy  LUNGS: clear with normal breathing effort HEART: regular rate & rhythm, no lower extremity edema ABDOMEN:abdomen soft, non-tender and normal bowel sounds NEURO: alert & oriented x 3 with fluent speech, no focal motor/sensory deficits  LABORATORY DATA:  I have  reviewed the data as listed    Latest Ref Rng & Units 06/04/2022    9:04 AM 01/27/2022    2:59 PM 10/09/2021    5:17 PM  CBC  WBC 4.0 - 10.5 K/uL 8.7  7.9  9.9   Hemoglobin 13.0 - 17.0 g/dL 14.7  13.5  13.8   Hematocrit 39.0 - 52.0 % 42.1  39.5  38.4   Platelets 150 - 400 K/uL 221  212  236         Latest Ref Rng & Units 06/04/2022    9:04 AM 01/27/2022    2:59 PM 01/20/2022    9:23 AM  CMP  Glucose 70 - 99 mg/dL 303  182  202   BUN 8 - 23 mg/dL _0 Creatinine 0.61 - 1.24 mg/dL 1.01  0.92  0.86   Sodium 135 - 145 mmol/L 134  137  140   Potassium 3.5 - 5.1 mmol/L 5.3  4.8  4.7   Chloride 98 - 111 mmol/L 101  108  105   CO2 22 - 32 mmol/L 27  23  23  Calcium 8.9 - 10.3 mg/dL 9.7  8.9  9.4   Total Protein 6.5 - 8.1 g/dL 6.5  6.3    Total Bilirubin 0.3 - 1.2 mg/dL 0.6  0.3    Alkaline Phos 38 - 126 U/L 71  69    AST 15 - 41 U/L 15  16    ALT 0 - 44 U/L 15  18        RADIOGRAPHIC STUDIES: I have personally reviewed the radiological images as listed and agreed with the findings in the report. No results found.   ASSESSMENT & PLAN: Blake Vaughn is a 61 y.o. male with    1. Right Cecal adenocarcinoma, pT4bN1aM1c, Stage IVC, with limited peritoneal metastasis, resected, Grade II-III, MMR normal  -Diagnosed in 10/2018. Treated with right hemicolectomy. He was found to have 2 small peritoneal metastasis during the surgery, which were removed.   -He completed 12 cycles of adjuvant FOLFOX. Now on surveillance. -CT A/P on 09/07/20 and 09/24/2021 were NED -Surveillance endo/colonoscopy on 05/03/21 under Dr. Cristina Gong at [place]. Pathology showed a tubular adenoma in the transverse colon, otherwise negative. Repeat colonoscopy recommended in 3 years, repeat endoscopy not necessary. -Blake Vaughn is clinically doing well.  Exam is benign, CBC is normal.  We will follow-up on the pending CEA from today.  Overall no clinical concern for colon cancer recurrence -He is 3.5 years from  initial diagnosis, continue surveillance.  Next routine surveillance CT 09/2022 which will likely be his last, I will call him with results -We reviewed signs and symptoms of recurrence.  Follow-up in 6 months, or sooner if needed  2.  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  Still functions normally for the most part -We reviewed the potential benefit and side effects of gabapentin, he would like to try.  Rx sent today   3. H/o Follicular Lymphoma  -Was treated with CHOP/Rituxan in 2010. Then he was previously treated with Maintenance Rituxan completed in 2012. Last f/u was in 2017.   4.  Recurrent ED visits -I reviewed his ED visits, labs, and imaging since last visit with [place] -CT AP 01/2022 showed constipation, no evidence of recurrence  5. DM and Hypercholesterolemia, uncontrolled hyperglycemia, GERD-On metformin, Glimepiride, Lipitor and metoprolol.  -His CT AP from 09/02/19 also shows aortic atherosclerosis, which can increase his risk for MI.  -He does use Prednisone before CT scans. He is also to hold metformin 2 days before scan. I advised him to closely watch his BG at home.  -CMP today shows K 5.3 and BG 303, I strongly encouraged him to follow-up with PCP   Plan: -Labs reviewed -Surveillance CT with allergy protocol (prednisone, benadryl) in 09/2022, I will call with results -Routine follow-up in 6 months, or sooner if needed -Follow-up PCP for DM and other chronic conditions -Rx gabapentin for neuropathy     Orders Placed This Encounter  Procedures   CT Abdomen Pelvis W Contrast    Standing Status:   Future    Standing Expiration Date:   06/04/2023    Order Specific Question:   If indicated for the ordered procedure, I authorize the administration of contrast media per Radiology protocol    Answer:   Yes    Order Specific Question:   Preferred imaging location?    Answer:    [facility]    Order Specific Question:   Is Oral Contrast requested  for this exam?    Answer:   Yes, Per Radiology protocol   CEA (IN HOUSE-[facility])   All questions were answered. The patient knows to call the clinic with any problems, questions or concerns. No barriers to learning were detected with the use of remote [employer] Spanish speaking interpreter. I spent 20 minutes counseling the patient face to face. The total time spent in the appointment was 30 minutes and more than 50% was on counseling and review of test results and coordination of care.     Alla Feeling, NP 06/04/22

## 2022-06-04 ENCOUNTER — Inpatient Hospital Stay: Payer: Medicare Other

## 2022-06-04 ENCOUNTER — Other Ambulatory Visit: Payer: Self-pay

## 2022-06-04 ENCOUNTER — Encounter: Payer: Self-pay | Admitting: Nurse Practitioner

## 2022-06-04 ENCOUNTER — Inpatient Hospital Stay: Payer: Medicare Other | Attending: Family Medicine | Admitting: Nurse Practitioner

## 2022-06-04 VITALS — BP 127/65 | HR 79 | Temp 97.8°F | Resp 18 | Ht 62.0 in | Wt 191.5 lb

## 2022-06-04 DIAGNOSIS — E119 Type 2 diabetes mellitus without complications: Secondary | ICD-10-CM | POA: Diagnosis not present

## 2022-06-04 DIAGNOSIS — Z91041 Radiographic dye allergy status: Secondary | ICD-10-CM

## 2022-06-04 DIAGNOSIS — Z79899 Other long term (current) drug therapy: Secondary | ICD-10-CM | POA: Diagnosis not present

## 2022-06-04 DIAGNOSIS — Z8572 Personal history of non-Hodgkin lymphomas: Secondary | ICD-10-CM | POA: Diagnosis present

## 2022-06-04 DIAGNOSIS — G629 Polyneuropathy, unspecified: Secondary | ICD-10-CM | POA: Insufficient documentation

## 2022-06-04 DIAGNOSIS — Z85038 Personal history of other malignant neoplasm of large intestine: Secondary | ICD-10-CM | POA: Diagnosis present

## 2022-06-04 DIAGNOSIS — C182 Malignant neoplasm of ascending colon: Secondary | ICD-10-CM

## 2022-06-04 LAB — CBC WITH DIFFERENTIAL/PLATELET
Abs Immature Granulocytes: 0.03 10*3/uL (ref 0.00–0.07)
Basophils Absolute: 0 10*3/uL (ref 0.0–0.1)
Basophils Relative: 0 %
Eosinophils Absolute: 0.3 10*3/uL (ref 0.0–0.5)
Eosinophils Relative: 4 %
HCT: 42.1 % (ref 39.0–52.0)
Hemoglobin: 14.7 g/dL (ref 13.0–17.0)
Immature Granulocytes: 0 %
Lymphocytes Relative: 24 %
Lymphs Abs: 2.1 10*3/uL (ref 0.7–4.0)
MCH: 30.6 pg (ref 26.0–34.0)
MCHC: 34.9 g/dL (ref 30.0–36.0)
MCV: 87.5 fL (ref 80.0–100.0)
Monocytes Absolute: 0.8 10*3/uL (ref 0.1–1.0)
Monocytes Relative: 9 %
Neutro Abs: 5.5 10*3/uL (ref 1.7–7.7)
Neutrophils Relative %: 63 %
Platelets: 221 10*3/uL (ref 150–400)
RBC: 4.81 MIL/uL (ref 4.22–5.81)
RDW: 12.6 % (ref 11.5–15.5)
WBC: 8.7 10*3/uL (ref 4.0–10.5)
nRBC: 0 % (ref 0.0–0.2)

## 2022-06-04 LAB — COMPREHENSIVE METABOLIC PANEL
ALT: 15 U/L (ref 0–44)
AST: 15 U/L (ref 15–41)
Albumin: 3.9 g/dL (ref 3.5–5.0)
Alkaline Phosphatase: 71 U/L (ref 38–126)
Anion gap: 6 (ref 5–15)
BUN: 18 mg/dL (ref 8–23)
CO2: 27 mmol/L (ref 22–32)
Calcium: 9.7 mg/dL (ref 8.9–10.3)
Chloride: 101 mmol/L (ref 98–111)
Creatinine, Ser: 1.01 mg/dL (ref 0.61–1.24)
GFR, Estimated: >60 mL/min (ref >60)
Glucose, Bld: 303 mg/dL — ABNORMAL HIGH (ref 70–99)
Potassium: 5.3 mmol/L — ABNORMAL HIGH (ref 3.5–5.1)
Sodium: 134 mmol/L — ABNORMAL LOW (ref 135–145)
Total Bilirubin: 0.6 mg/dL (ref 0.3–1.2)
Total Protein: 6.5 g/dL (ref 6.5–8.1)

## 2022-06-04 LAB — FERRITIN: Ferritin: 150 ng/mL (ref 24–336)

## 2022-06-04 LAB — CEA (IN HOUSE-CHCC): CEA (CHCC-In House): 3.86 ng/mL (ref 0.00–5.00)

## 2022-06-04 MED ORDER — GABAPENTIN 300 MG PO CAPS: 300.0000 mg | ORAL_CAPSULE | Freq: Every day | ORAL | 1 refills | Status: DC

## 2022-06-04 MED ORDER — DIPHENHYDRAMINE HCL 50 MG PO TABS: 50.0000 mg | ORAL_TABLET | Freq: Once | ORAL | 0 refills | Status: DC

## 2022-06-25 DIAGNOSIS — H34812 Central retinal vein occlusion, left eye, with macular edema: Secondary | ICD-10-CM | POA: Diagnosis not present

## 2022-07-01 NOTE — Patient Instructions (Signed)
Good to see you today - Thank you for coming in  Things we discussed today:  Jardiance Start one tab a day Keep taking all the other medications  Make an appointment to see our Dr Valentina Lucks for diabetes review Bring all your other medications  Make an appointment to see your  regular doctor about your throat

## 2022-07-01 NOTE — Progress Notes (Addendum)
    SUBJECTIVE:   CHIEF COMPLAINT / HPI:   Video interpreter used for entire visit  Diabetes type 2, uncontrolled ([place]) Here for follow up.  Did not bring his medications. Reports he takes them regularly and seems to know the names and frequency.  Does not check his blood sugars at home. Does not like to stick himself.   Very interested in CBG and states will pay for it himself. Does not feel he has had low blood sugar      HYPERTENSION On his problem list - discussed ARB in 2021.  On no antithn medications now BP Readings from Last 3 Encounters:  07/02/22 128/74  06/04/22 127/65  04/21/22 (!) 151/81      Throat Discomfort Has a dry throat or discomfort frequently     PERTINENT  PMH / PSH: Colon Cancer,   No history of dehydration or pancreatitis Labs by Oncology in 05/2022 Mild hyperK  OBJECTIVE:   BP 128/74   Pulse 78   Ht '5\' 2"'$  (1.575 m)   Wt 187 lb (84.8 kg)   SpO2 97%   BMI 34.20 kg/m   Alert NAD   ASSESSMENT/PLAN:   HTN (hypertension) Seems at goal today.  On no medications   Type 2 diabetes mellitus with hyperglycemia ([place]) Not controlled.  Had long discussion about fasting blood sugar checks, CBG monitors, A1Cs.  He seems very convinced that CBG is what he needs. Skeptical about A1c.  Will refer to pharmacy for further discussions and perhaps CBG.  He reluctantly agreed to start Jardiance.   Discussed keeping hydrated and to continue his other medications and to try to check fasting blood sugars and bring in the readings   Throat discomfort Did not address today due to focus on diabetes.  Recommend he follow up with his PCP for a specific visit for this   Microalbuminuria Still elevated.  Hopefully he started the SGLT2 as directed.  Would check he is taking on follow up    Patient Instructions  Good to see you today - Thank you for coming in  Things we discussed today:  Jardiance Start one tab a day Keep taking all the other medications  Make an  appointment to see our Dr Valentina Lucks for diabetes review Bring all your other medications  Make an appointment to see your  regular doctor about your throat      Lind Covert, MD [place]

## 2022-07-02 ENCOUNTER — Ambulatory Visit (INDEPENDENT_AMBULATORY_CARE_PROVIDER_SITE_OTHER): Payer: Medicare Other | Admitting: Family Medicine

## 2022-07-02 VITALS — BP 128/74 | HR 78 | Ht 62.0 in | Wt 187.0 lb

## 2022-07-02 DIAGNOSIS — R070 Pain in throat: Secondary | ICD-10-CM

## 2022-07-02 DIAGNOSIS — E1165 Type 2 diabetes mellitus with hyperglycemia: Secondary | ICD-10-CM | POA: Diagnosis not present

## 2022-07-02 DIAGNOSIS — R809 Proteinuria, unspecified: Secondary | ICD-10-CM

## 2022-07-02 DIAGNOSIS — I10 Essential (primary) hypertension: Secondary | ICD-10-CM

## 2022-07-02 LAB — POCT GLYCOSYLATED HEMOGLOBIN (HGB A1C): HbA1c, POC (controlled diabetic range): 8.9 % — AB (ref 0.0–7.0)

## 2022-07-02 MED ORDER — EMPAGLIFLOZIN 10 MG PO TABS: 10.0000 mg | ORAL_TABLET | Freq: Every day | ORAL | 3 refills | Status: DC

## 2022-07-02 NOTE — Assessment & Plan Note (Signed)
Not controlled.  Had long discussion about fasting blood sugar checks, CBG monitors, A1Cs.  He seems very convinced that CBG is what he needs. Skeptical about A1c.  Will refer to pharmacy for further discussions and perhaps CBG.  He reluctantly agreed to start Jardiance.   Discussed keeping hydrated and to continue his other medications and to try to check fasting blood sugars and bring in the readings

## 2022-07-02 NOTE — Assessment & Plan Note (Addendum)
Seems at goal today.  On no medications

## 2022-07-02 NOTE — Assessment & Plan Note (Signed)
Did not address today due to focus on diabetes.  Recommend he follow up with his PCP for a specific visit for this

## 2022-07-03 LAB — MICROALBUMIN / CREATININE URINE RATIO
Creatinine, Urine: 104.5 mg/dL
Microalb/Creat Ratio: 846 mg/g creat — ABNORMAL HIGH (ref 0–29)
Microalbumin, Urine: 884 ug/mL

## 2022-07-08 ENCOUNTER — Ambulatory Visit: Payer: Medicare Other | Admitting: Pharmacist

## 2022-07-08 NOTE — Assessment & Plan Note (Signed)
Still elevated.  Hopefully he started the SGLT2 as directed.  Would check he is taking on follow up

## 2022-07-09 ENCOUNTER — Ambulatory Visit (INDEPENDENT_AMBULATORY_CARE_PROVIDER_SITE_OTHER): Payer: Medicare Other | Admitting: Family Medicine

## 2022-07-09 ENCOUNTER — Encounter: Payer: Self-pay | Admitting: Family Medicine

## 2022-07-09 VITALS — BP 138/57 | HR 77 | Temp 98.3°F | Ht 62.0 in | Wt 189.0 lb

## 2022-07-09 DIAGNOSIS — E1165 Type 2 diabetes mellitus with hyperglycemia: Secondary | ICD-10-CM | POA: Diagnosis not present

## 2022-07-09 DIAGNOSIS — R0609 Other forms of dyspnea: Secondary | ICD-10-CM

## 2022-07-09 DIAGNOSIS — J029 Acute pharyngitis, unspecified: Secondary | ICD-10-CM

## 2022-07-09 DIAGNOSIS — R070 Pain in throat: Secondary | ICD-10-CM | POA: Diagnosis not present

## 2022-07-09 LAB — GLUCOSE, POCT (MANUAL RESULT ENTRY): POC Glucose: 413 mg/dl — AB (ref 70–99)

## 2022-07-09 LAB — POCT RAPID STREP A (OFFICE): Rapid Strep A Screen: NEGATIVE

## 2022-07-09 MED ORDER — ATORVASTATIN CALCIUM 20 MG PO TABS: 20.0000 mg | ORAL_TABLET | Freq: Every day | ORAL | 3 refills | Status: DC

## 2022-07-09 MED ORDER — OMEPRAZOLE 20 MG PO CPDR: 20.0000 mg | DELAYED_RELEASE_CAPSULE | Freq: Every day | ORAL | 3 refills | Status: DC

## 2022-07-09 NOTE — Progress Notes (Signed)
    SUBJECTIVE:   CHIEF COMPLAINT / HPI:   Throat discomfort with dyspnea - Present for at least 5 years - Mainly occurs with exertional things (pushing lawn mower, moving things around the house) but can be very minimal - Has some associated shortness of breath - Denies chest pain or wheezing sensation - Walks 30 minutes daily before bed and has 30 minutes of dyspneic recovery afterwards - No true smoking history, only tried a cigarette once  PERTINENT  PMH / PSH: T2DM, colonic cancer, HTN, neuropathy  OBJECTIVE:   BP (!) 138/57   Pulse 77   Ht '5\' 2"'$  (1.575 m)   Wt 189 lb (85.7 kg)   SpO2 96%   BMI 34.57 kg/m   Gen: well-appearing, NAD CV: RRR, no m/r/g appreciated, no peripheral edema Pulm: CTAB, no wheezes/crackles GI: soft, non-tender, non-distended  ASSESSMENT/PLAN:   Throat discomfort  Dyspnea on exertion Given dyspnea on exertion with minimal effort, concern for a cardiac etiology to patient's symptoms. Did consider allergies, asthma but doesn't fit clinical picture. Differential includes cardiac and reflux, given risk factors with HTN and DM need further evaluation - Restart atorvastatin - Cardiology referral placed - Lipid panel - CBC - Trial omeprazole    [name], [place]

## 2022-07-09 NOTE — Patient Instructions (Signed)
Your symptoms right now could be possible reflux, but could also be cardiac in etiology.  Given your diabetes and other risk factors, I am going to send in a referral to cardiologist just to have you evaluated to make sure that this is not related to your heart.  I am restarting a medication called atorvastatin for you, this is something that is also used to protect your heart and diabetics.  I am also sending in a medication called omeprazole, if it is related to any kind of reflux then that should also help those symptoms as well.     Sus sntomas en este momento podran ser un posible reflujo, pero tambin podran ser de Multimedia programmer. Dada su diabetes y 25 factores de riesgo, enviar una remisin a un cardilogo solo para que lo evalen y asegurarme de que esto no est relacionado con su corazn.  Estoy reiniciando un medicamento llamado atorvastatina para usted, esto es algo que tambin se [place] para proteger su corazn y los diabticos.  Tambin estoy enviando un medicamento llamado omeprazol, si est relacionado con algn tipo de reflujo, eso tambin debera ayudar con esos sntomas.

## 2022-07-10 ENCOUNTER — Other Ambulatory Visit: Payer: Self-pay | Admitting: Family Medicine

## 2022-07-10 ENCOUNTER — Telehealth: Payer: Self-pay | Admitting: Family Medicine

## 2022-07-10 DIAGNOSIS — E1165 Type 2 diabetes mellitus with hyperglycemia: Secondary | ICD-10-CM

## 2022-07-10 LAB — CBC
Hematocrit: 43 % (ref 37.5–51.0)
Hemoglobin: 14.6 g/dL (ref 13.0–17.7)
MCH: 30.8 pg (ref 26.6–33.0)
MCHC: 34 g/dL (ref 31.5–35.7)
MCV: 91 fL (ref 79–97)
Platelets: 154 10*3/uL (ref 150–450)
RBC: 4.74 x10E6/uL (ref 4.14–5.80)
RDW: 12.3 % (ref 11.6–15.4)
WBC: 6.5 10*3/uL (ref 3.4–10.8)

## 2022-07-10 LAB — LIPID PANEL
Chol/HDL Ratio: 5.8 ratio — ABNORMAL HIGH (ref 0.0–5.0)
Cholesterol, Total: 272 mg/dL — ABNORMAL HIGH (ref 100–199)
HDL: 47 mg/dL (ref >39)
LDL Chol Calc (NIH): 152 mg/dL — ABNORMAL HIGH (ref 0–99)
Triglycerides: 389 mg/dL — ABNORMAL HIGH (ref 0–149)
VLDL Cholesterol Cal: 73 mg/dL — ABNORMAL HIGH (ref 5–40)

## 2022-07-10 NOTE — Telephone Encounter (Signed)
Patient came in stating that he has some questions about his medication and would like for someone to call him to discuss please. Best contact number is [phone]

## 2022-07-10 NOTE — Telephone Encounter (Signed)
Routed message to PCP. Kory Rains, CMA  

## 2022-07-11 MED ORDER — SITAGLIPTIN PHOSPHATE 50 MG PO TABS: 50.0000 mg | ORAL_TABLET | Freq: Every day | ORAL | 0 refills | Status: DC

## 2022-07-11 MED ORDER — METFORMIN HCL 1000 MG PO TABS: 1000.0000 mg | ORAL_TABLET | Freq: Two times a day (BID) | ORAL | 3 refills | Status: DC

## 2022-07-11 MED ORDER — GLIPIZIDE ER 10 MG PO TB24: 10.0000 mg | ORAL_TABLET | Freq: Every day | ORAL | 0 refills | Status: DC

## 2022-07-11 NOTE — Telephone Encounter (Signed)
Called patient using interpreter [phone]  Patient was confused about his medications as he only had 1 medication (Jardiance) that was at the pharmacy when he went to go pick up his prescriptions.  He thought his medications may have changed and he was only supposed to be taking 1 pill for his diabetes.  Discussed with them that no we have him on 4 medications, he had been unclear in his visit if he needed further refills on those.  Refills have not been provided.   Rickesha Veracruz, DO

## 2022-08-06 ENCOUNTER — Ambulatory Visit: Payer: Medicare Other | Admitting: Interventional Cardiology

## 2022-09-08 NOTE — Progress Notes (Deleted)
Cardiology Office Note:    Date:  09/08/2022   ID:  Blake Vaughn, DOB 1961/07/14, MRN [id number]  PCP:  Rise Vaughn, [place] Providers Cardiologist:  None { Click to update primary MD,subspecialty MD or APP then REFRESH:1}    Referring MD: [name], DO   CC: *** Consulted for the evaluation of DOE at the behest of Dr. Clayborne Dana  History of Present Illness:    Blake Vaughn is a 61 y.o. male with a hx of HTN with DM, HLD, complicated by orthostatic hypotension, colon cancer, lymphoma with prior chemotherapy (CHOP Rituxan and FOLFOX after) , aortic atherosclerosis and RCA CAC.  Patient notes that he is feeling ***.   Was last feeling well ***. Able to ***  Has had no chest pain, chest pressure, chest tightness, chest stinging ***.  Discomfort occurs with ***, worsens with ***, and improves with ***.    Patient exertion notable for *** with *** and feels no symptoms.    No shortness of breath, DOE ***.  No PND or orthopnea***.  No weight gain***, leg swelling ***, or abdominal swelling***.  No syncope or near syncope ***. Notes *** no palpitations or funny heart beats.     Patient reports prior cardiac testing including ***  No history of ***pre-eclampsia, gestation HTN or gestational DM.  No Fen-Phen or drug use***.  Ambulatory BP ***.   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   Qualifier: Diagnosis of  By: [name], [name]    GI bleed 11/05/2018   Hypercholesterolemia    Hypertension 11/27/2011   Lymphoma ([place])    gets annual chemo last tx Feb 2013   TOBACCO USE, QUIT 08/18/2009   Qualifier: Diagnosis of  By: Drue Flirt  MD, Merrily Brittle      Past Surgical History:  Procedure Laterality Date   ESOPHAGOGASTRODUODENOSCOPY (EGD) WITH PROPOFOL N/A 11/05/2018   Procedure: ESOPHAGOGASTRODUODENOSCOPY (EGD) WITH PROPOFOL;  Surgeon:  [name], MD;  Location: [facility];  Service: Endoscopy;  Laterality: N/A;   IR IMAGING GUIDED PORT INSERTION  11/24/2018   IR REMOVAL TUN ACCESS W/ PORT W/O FL MOD SED  11/11/2021   LAPAROSCOPIC APPENDECTOMY N/A 10/21/2018   Procedure: Exploratory laparotomy right hemicolectomy;  Surgeon: [name], MD;  Location: [facility];  Service: General;  Laterality: N/A;    Current Medications: No outpatient medications have been marked as taking for the 09/09/22 encounter (Appointment) with Werner Lean, MD.     Allergies:   Iohexol   Social History   Socioeconomic History   Marital status: Married    Spouse name: Not on file   Number of children: 2   Years of education: Not on file   Highest education level: Not on file  Occupational History   Not on file  Tobacco Use   Smoking status: Former    Packs/day: 0.50    Years: 30.00    Total pack years: 15.00    Types: Cigarettes    Quit date: 12/08/2004    Years since quitting: 17.7   Smokeless tobacco: Former    Quit date: 11/20/2006  Vaping Use   Vaping Use: Never used  Substance and Sexual Activity   Alcohol use: No   Drug use: No   Sexual activity: Not on file  Other Topics Concern   Not on file  Social History Narrative  Not on file   Social Determinants of Health   Financial Resource Strain: Not on file  Food Insecurity: Not on file  Transportation Needs: Not on file  Physical Activity: Not on file  Stress: Not on file  Social Connections: Not on file     Family History: The patient's ***family history includes Cancer in his brother; Diabetes in his brother, father, and mother; Renal Disease in his brother.  ROS:   Please see the history of present illness.    *** All other systems reviewed and are negative.  EKGs/Labs/Other Studies Reviewed:    The following studies were reviewed today: ***  EKG:  EKG is *** ordered today.  The ekg ordered today demonstrates *** 09/09/22: ***  No  results found for this or any previous visit from the past 3650 days.     Recent Labs: 06/04/2022: ALT 15; BUN 18; Creatinine, Ser 1.01; Potassium 5.3; Sodium 134 07/09/2022: Hemoglobin 14.6; Platelets 154  Recent Lipid Panel    Component Value Date/Time   CHOL 272 (H) 07/09/2022 1041   TRIG 389 (H) 07/09/2022 1041   HDL 47 07/09/2022 1041   CHOLHDL 5.8 (H) 07/09/2022 1041   CHOLHDL 7.5 (H) 04/02/2016 1229   VLDL NOT CALC 04/02/2016 1229   LDLCALC 152 (H) 07/09/2022 1041   LDLDIRECT 138 (H) 11/21/2011 0957     Risk Assessment/Calculations:   {Does this patient have ATRIAL FIBRILLATION?[phone]}  No BP recorded.  {Refresh Note OR Click here to enter BP  :1}***         Physical Exam:    VS:  There were no vitals taken for this visit.    Wt Readings from Last 3 Encounters:  07/09/22 189 lb (85.7 kg)  07/02/22 187 lb (84.8 kg)  06/04/22 191 lb 8 oz (86.9 kg)    Gen: *** distress, *** obese/well nourished/malnourished   Neck: No JVD, *** carotid bruit Ears: *** Frank Sign Cardiac: No Rubs or Gallops, *** Murmur, ***cardia, *** radial pulses Respiratory: Clear to auscultation bilaterally, *** effort, ***  respiratory rate GI: Soft, nontender, non-distended *** MS: No *** edema; *** moves all extremities Integument: Skin feels *** Neuro:  At time of evaluation, alert and oriented to person/place/time/situation *** Psych: Normal affect, patient feels ***   ASSESSMENT:    No diagnosis found. PLAN:    DOE Prior Malignancy with Cardiotoxic Chemotherapy - with Age ?87 y*** - relevant biomarkers *** - ASCVD *** - LV Function including GLS or other Strain, 3D Eval, MAPSE, or CMR: ***  - Mild/moderate severe CTR-CD (EF < 40%, 40-49%, 15% reduction in GLS) ***  - with Lower?dose radiotherapy (<30 Gy) where the heart is in the treatment field   - with High?dose radiotherapy (?30 Gy) where the heart is in the treatment field   - with High?dose anthracycline (eg,  doxorubicin ?250 mg/m2, epirubicin ?600 mg/m2) - with Lower?dose anthracycline (eg, doxorubicin <250 mg/m2, epirubicin <600 mg/m2)   - on HER2 inhibitor requirring 3 month Echo +/- post infusion troponin*** - VEGF/Proeastome/BCR-ABl inhibitors 3 month echo for 1 year, then 6 month until done with treatment***  - on Daratumumab with blood pressure ***< 180/110 mm Hg  - Ibrutinib needed ZioPatch based on palpitations and concern for atrial fibrillation  - Immune Checkpoint Inhibitor with concern for myocarditis *** or QTc prolongation*** (Fridericia Formula)  - on anthracycline based therapy for lymphoma, discussed atorvastatin as a potential protective factor ***  - *** on VEGFi, Immuno therpay, or had 30+ Gy  radiation in a chest field, LDL goal < 70  ***  (5-FU) and capecitabine without evidence of angina sx or evidence of coronary vasospasm  - Myeloma Bleeding Risk:   Body mass index >25, Age >75, Personal or family history of VTE, Central venous catheter, Acute infection or Hospitalization, Blood clotting disorders or Thrombophilia, Immobility with performance status of >1, Comorbidities (liver, renal impairment, chronic obstructive pulmonary disorder, diabetes mellitus, chronic inflammatory bowel disease), Race (Caucasian is a risk factor) - ASA (+1) vs AC (+2) - thromboprophylaxis per primary  - Last chemo ***, needs echo 6 mo, 1 year, and 2 years after *** - Encouraged exercise on a regular basis and healthy dietary habits   https://www.ahajournals.org/doi/10.1161/JAHA.[email or web address]       {Are you ordering a CV Procedure (e.g. stress test, cath, DCCV, TEE, etc)?   Press F2        :[id number]}    Medication Adjustments/Labs and Tests Ordered: Current medicines are reviewed at length with the patient today.  Concerns regarding medicines are outlined above.  No orders of the defined types were placed in this encounter.  No orders of the defined types were placed in this  encounter.   There are no Patient Instructions on file for this visit.   Signed, Werner Lean, MD  09/08/2022 8:02 AM    [place]

## 2022-09-09 ENCOUNTER — Ambulatory Visit: Payer: Medicare Other | Attending: Internal Medicine | Admitting: Internal Medicine

## 2022-10-01 ENCOUNTER — Other Ambulatory Visit (HOSPITAL_COMMUNITY): Payer: Self-pay

## 2022-10-01 ENCOUNTER — Other Ambulatory Visit: Payer: Self-pay

## 2022-10-01 ENCOUNTER — Telehealth: Payer: Self-pay

## 2022-10-01 ENCOUNTER — Encounter: Payer: Self-pay | Admitting: Hematology

## 2022-10-01 DIAGNOSIS — E113313 Type 2 diabetes mellitus with moderate nonproliferative diabetic retinopathy with macular edema, bilateral: Secondary | ICD-10-CM | POA: Diagnosis not present

## 2022-10-01 DIAGNOSIS — H35372 Puckering of macula, left eye: Secondary | ICD-10-CM | POA: Diagnosis not present

## 2022-10-01 DIAGNOSIS — H25813 Combined forms of age-related cataract, bilateral: Secondary | ICD-10-CM | POA: Diagnosis not present

## 2022-10-01 DIAGNOSIS — Z91041 Radiographic dye allergy status: Secondary | ICD-10-CM

## 2022-10-01 DIAGNOSIS — H34812 Central retinal vein occlusion, left eye, with macular edema: Secondary | ICD-10-CM | POA: Diagnosis not present

## 2022-10-01 MED ORDER — DIPHENHYDRAMINE HCL 50 MG PO TABS
50.0000 mg | ORAL_TABLET | Freq: Once | ORAL | 0 refills | Status: DC
  Filled 2022-10-01: qty 1, 1d supply, fill #0

## 2022-10-01 MED ORDER — PREDNISONE 50 MG PO TABS
ORAL_TABLET | ORAL | 0 refills | Status: DC
  Filled 2022-10-01: qty 3, 1d supply, fill #0

## 2022-10-01 NOTE — Telephone Encounter (Signed)
Spoke with patient using [employer]. Detailed instructions reviewed for administration of Prednisone and Benadryl prior to CT scan on 10/03/22 for contrast medium allergy. Patient requested prescriptions be sent to [facility] outpatient pharmacy. Reviewed CT scan pre-instructions and time to arrive. All questions answered.

## 2022-10-02 ENCOUNTER — Other Ambulatory Visit (HOSPITAL_COMMUNITY): Payer: Self-pay

## 2022-10-02 ENCOUNTER — Encounter: Payer: Self-pay | Admitting: Hematology

## 2022-10-03 ENCOUNTER — Other Ambulatory Visit: Payer: Self-pay

## 2022-10-03 ENCOUNTER — Ambulatory Visit (HOSPITAL_COMMUNITY)
Admission: RE | Admit: 2022-10-03 | Discharge: 2022-10-03 | Disposition: A | Discharge status: Home / Self Care | Payer: Medicare Other | Source: Ambulatory Visit | Attending: Nurse Practitioner | Admitting: Nurse Practitioner

## 2022-10-03 DIAGNOSIS — E1165 Type 2 diabetes mellitus with hyperglycemia: Secondary | ICD-10-CM | POA: Insufficient documentation

## 2022-10-03 DIAGNOSIS — Z91041 Radiographic dye allergy status: Secondary | ICD-10-CM

## 2022-10-03 DIAGNOSIS — C189 Malignant neoplasm of colon, unspecified: Secondary | ICD-10-CM | POA: Diagnosis not present

## 2022-10-03 DIAGNOSIS — K760 Fatty (change of) liver, not elsewhere classified: Secondary | ICD-10-CM | POA: Diagnosis not present

## 2022-10-03 DIAGNOSIS — D500 Iron deficiency anemia secondary to blood loss (chronic): Secondary | ICD-10-CM

## 2022-10-03 DIAGNOSIS — C182 Malignant neoplasm of ascending colon: Secondary | ICD-10-CM | POA: Diagnosis not present

## 2022-10-03 LAB — POCT I-STAT CREATININE: Creatinine, Ser: 1 mg/dL (ref 0.61–1.24)

## 2022-10-03 MED ORDER — DIPHENHYDRAMINE HCL 50 MG/ML IJ SOLN: 50.0000 mg | Freq: Once | INTRAMUSCULAR | Status: DC

## 2022-10-03 MED ORDER — SODIUM CHLORIDE (PF) 0.9 % IJ SOLN
INTRAMUSCULAR | Status: AC
  Filled 2022-10-03: qty 50

## 2022-10-03 MED ORDER — PREDNISONE 50 MG PO TABS: 50.0000 mg | ORAL_TABLET | Freq: Four times a day (QID) | ORAL | Status: DC

## 2022-10-03 MED ORDER — IOHEXOL 300 MG/ML  SOLN
100.0000 mL | Freq: Once | INTRAMUSCULAR | Status: AC | PRN
  Administered 2022-10-03: 100 mL via INTRAVENOUS

## 2022-10-03 MED ORDER — DIPHENHYDRAMINE HCL 25 MG PO CAPS: 50.0000 mg | ORAL_CAPSULE | Freq: Once | ORAL | Status: DC

## 2022-10-06 ENCOUNTER — Ambulatory Visit: Payer: Medicare Other | Admitting: Hematology

## 2022-10-06 ENCOUNTER — Other Ambulatory Visit: Payer: Medicare Other

## 2022-10-15 ENCOUNTER — Telehealth: Payer: Self-pay

## 2022-10-15 ENCOUNTER — Other Ambulatory Visit: Payer: Self-pay

## 2022-10-15 ENCOUNTER — Encounter: Payer: Self-pay | Admitting: Hematology

## 2022-10-15 ENCOUNTER — Inpatient Hospital Stay (HOSPITAL_BASED_OUTPATIENT_CLINIC_OR_DEPARTMENT_OTHER): Payer: Medicare Other | Admitting: Hematology

## 2022-10-15 ENCOUNTER — Inpatient Hospital Stay: Payer: Medicare Other | Attending: Hematology

## 2022-10-15 VITALS — BP 157/82 | HR 86 | Temp 97.9°F | Resp 18 | Ht 62.0 in | Wt 191.2 lb

## 2022-10-15 DIAGNOSIS — C182 Malignant neoplasm of ascending colon: Secondary | ICD-10-CM | POA: Diagnosis not present

## 2022-10-15 DIAGNOSIS — Z85038 Personal history of other malignant neoplasm of large intestine: Secondary | ICD-10-CM | POA: Diagnosis not present

## 2022-10-15 DIAGNOSIS — Z9221 Personal history of antineoplastic chemotherapy: Secondary | ICD-10-CM | POA: Insufficient documentation

## 2022-10-15 DIAGNOSIS — D500 Iron deficiency anemia secondary to blood loss (chronic): Secondary | ICD-10-CM

## 2022-10-15 DIAGNOSIS — E1165 Type 2 diabetes mellitus with hyperglycemia: Secondary | ICD-10-CM | POA: Insufficient documentation

## 2022-10-15 DIAGNOSIS — Z8572 Personal history of non-Hodgkin lymphomas: Secondary | ICD-10-CM | POA: Insufficient documentation

## 2022-10-15 LAB — CBC WITH DIFFERENTIAL (CANCER CENTER ONLY)
Abs Immature Granulocytes: 0.03 10*3/uL (ref 0.00–0.07)
Basophils Absolute: 0 10*3/uL (ref 0.0–0.1)
Basophils Relative: 0 %
Eosinophils Absolute: 0.4 10*3/uL (ref 0.0–0.5)
Eosinophils Relative: 4 %
HCT: 41.4 % (ref 39.0–52.0)
Hemoglobin: 15 g/dL (ref 13.0–17.0)
Immature Granulocytes: 0 %
Lymphocytes Relative: 23 %
Lymphs Abs: 2.3 10*3/uL (ref 0.7–4.0)
MCH: 31 pg (ref 26.0–34.0)
MCHC: 36.2 g/dL — ABNORMAL HIGH (ref 30.0–36.0)
MCV: 85.5 fL (ref 80.0–100.0)
Monocytes Absolute: 0.7 10*3/uL (ref 0.1–1.0)
Monocytes Relative: 7 %
Neutro Abs: 6.5 10*3/uL (ref 1.7–7.7)
Neutrophils Relative %: 66 %
Platelet Count: 186 10*3/uL (ref 150–400)
RBC: 4.84 MIL/uL (ref 4.22–5.81)
RDW: 12 % (ref 11.5–15.5)
WBC Count: 9.8 10*3/uL (ref 4.0–10.5)
nRBC: 0 % (ref 0.0–0.2)

## 2022-10-15 LAB — CMP (CANCER CENTER ONLY)
ALT: 15 U/L (ref 0–44)
AST: 13 U/L — ABNORMAL LOW (ref 15–41)
Albumin: 3.9 g/dL (ref 3.5–5.0)
Alkaline Phosphatase: 76 U/L (ref 38–126)
Anion gap: 10 (ref 5–15)
BUN: 26 mg/dL — ABNORMAL HIGH (ref 8–23)
CO2: 22 mmol/L (ref 22–32)
Calcium: 9.2 mg/dL (ref 8.9–10.3)
Chloride: 97 mmol/L — ABNORMAL LOW (ref 98–111)
Creatinine: 1.24 mg/dL (ref 0.61–1.24)
GFR, Estimated: >60 mL/min (ref >60)
Glucose, Bld: 542 mg/dL (ref 70–99)
Potassium: 4.6 mmol/L (ref 3.5–5.1)
Sodium: 129 mmol/L — ABNORMAL LOW (ref 135–145)
Total Bilirubin: 0.6 mg/dL (ref 0.3–1.2)
Total Protein: 6.4 g/dL — ABNORMAL LOW (ref 6.5–8.1)

## 2022-10-15 LAB — FERRITIN: Ferritin: 169 ng/mL (ref 24–336)

## 2022-10-15 LAB — CEA (ACCESS): CEA (CHCC): 5.15 ng/mL — ABNORMAL HIGH (ref 0.00–5.00)

## 2022-10-15 NOTE — Telephone Encounter (Signed)
Critical Lab value report: BG [phone].  Verbally notified Dr. Burr Medico.

## 2022-10-15 NOTE — Progress Notes (Signed)
Blake Vaughn   [phone] [phone]   Clinic Follow up Note   Patient Care Team: [name], DO as PCP - General (Family Medicine) [name], MD as Consulting Physician (Colon and Rectal Surgery) [name], MD as Consulting Physician (Hematology)  Date of Service:  10/15/2022  CHIEF COMPLAINT: f/u of colon cancer  CURRENT THERAPY:  Surveillance  ASSESSMENT & PLAN:  Blake Vaughn is a 61 y.o. male with   1. Right Cecal adenocarcinoma, pT4bN1aM1c, Stage IVC, with limited peritoneal metastasis, resected, Grade II-III, MMR normal  -Diagnosed in 10/2018. S/p right hemicolectomy. He was found to have 2 small peritoneal metastasis during the surgery, which were removed.   -He completed 12 cycles of adjuvant FOLFOX. -Last endo/colonoscopy on 05/03/21 under Dr. Cristina Gong at [place] showed a tubular adenoma, otherwise negative. Repeat colonoscopy recommended in 3 years, repeat EGD not necessary. -CT AP on 10/03/22 showed NED. I reviewed the results with him today. -He is clinically doing well. Labs reviewed, his sugar is 542, otherwise unremarkable. -We will continue cancer surveillance. He can have his port removed at this point.   2. H/o Follicular Lymphoma  -Was treated with CHOP/Rituxan in 2010. Then he was previously treated with Maintenance Rituxan completed in 2012. Last f/u was in 2017.   3. DM and Hypercholesterolemia, uncontrolled hyperglycemia   -On metformin, Glimepiride, Lipitor and metoprolol.  -His CT AP from 09/02/19 also shows aortic atherosclerosis, which can increase his risk for MI.  -He does use Prednisone before CT scans. He is also to hold metformin 2 days before scan. I advised him to closely watch his BG at home.  -I had a long conversation about diabetic diet control and medication compliance. We reviewed diabetic complications from hyperglycemia. -I strongly encouraged him to follow-up with PCP   5. Neuropathy, G1   -Secondary to prior chemo Oxaliplatin  -overall stable      Plan  -lab and scan reviewed, NED -lab and f/u in 6 months -he will call his PCP today for a close f/u of his hyperglycemia and restart meds when he gets home    No problem-specific Assessment & Plan notes found for this encounter.   SUMMARY OF ONCOLOGIC HISTORY: Oncology History Overview Note  Cancer Staging Cancer of right colon [facility]) Staging form: Colon and Rectum, AJCC 8th Edition - Pathologic stage from 10/21/2018: Stage IVC (pT4b, pN1a, pM1c) - Signed by [name], MD on 11/13/2018 Total positive nodes: 1 Histologic grading system: 4 grade system Histologic grade (G): G2 Residual tumor (R): R0 - None Sites of metastasis: Peritoneum Lymph-vascular invasion (LVI): LVI present/identified, NOS  Follicular lymphoma ([place]), History of Staging form: Lymphoid Neoplasms, AJCC 6th Edition - Clinical: Stage II - Signed by Curt Bears, MD on 0/94/0768     Follicular lymphoma [facility]), History of  11/2009 Initial Diagnosis   Follicular lymphoma ([place]), History of    Chemotherapy   1) Status post 6 cycles of systemic chemotherapy with CHOP/Rituxan last dose given 05/01/2009.  2) Maintenance Rituxan at 375 mg per meter square given every 2 months status post 12 cycles    Cancer of right colon ([place])  10/21/2018 Surgery   Exploratory laparotomy right hemicolectomy by Dr. [name] and Dr. [name]  10/21/18   10/21/2018 Pathology Results   Diagnosis 10/21/18 Colon, segmental resection for tumor, right ascending and appendix - ADENOCARCINOMA, MODERATE TO POORLY DIFFERENTIATED (4 CM) - METASTATIC CARCINOMA INVOLVING ONE OF EIGHTEEN LYMPH NODES (1/18) - CARCINOMA EXTENDS INTO THE  APPENDIX - TWO TUMOR DEPOSITS PRESENT - SEE ONCOLOGY TABLE AND COMMENT BELOW   10/21/2018 Cancer Staging   Staging form: Colon and Rectum, AJCC 8th Edition - Pathologic stage from 10/21/2018: Stage IVC (pT4b, pN1a, pM1c) - Signed by [name],  MD on 11/13/2018   11/04/2018 Imaging   CT AP W Contrast 11/04/18  IMPRESSION: 1. Interval appendectomy. There is residual soft tissue thickening and stranding in the right lower quadrant adjacent to the cecum which may represent ongoing inflammation versus postsurgical changes. A small soft tissue density adjacent to the surgical sutures may reflect small hematoma or operative collection. No large focal fluid collection to suggest drainable abscess allowing for absence of contrast 2. Fluid-filled colon without wall thickening, could reflect diarrheal process   11/08/2018 Imaging   CT Chest W Contrast 11/08/18  IMPRESSION: 1. No acute findings are noted in the thorax to account for the patient's symptoms. 2. Aortic atherosclerosis, in addition to left main and 3 vessel coronary artery disease. Please note that although the presence of coronary artery calcium documents the presence of coronary artery disease, the severity of this disease and any potential stenosis cannot be assessed on this non-gated CT examination. Assessment for potential risk factor modification, dietary therapy or pharmacologic therapy may be warranted, if clinically indicated. 3. Additional incidental findings, as above. Aortic Atherosclerosis (ICD10-I70.0).   11/12/2018 Initial Diagnosis   Cancer of right colon ([place])   11/25/2018 - 05/05/2019 Chemotherapy   FOLFOX q2weeks starting 11/25/18. Due to moderate thrombocytopenia, I will stop 5-FU bolus, and reduce pump infusion to 2220m/m2 starting with cycle 7.  Plan for last treatment on 05/05/19.    02/10/2019 Imaging   CT AP W Contrast   IMPRESSION: 1. No evidence metastatic disease. 2. Hepatic steatosis. 3. Mildly enlarged prostate. 4. Aortic atherosclerosis (ICD10-170.0). coronary artery calcification.   09/02/2019 Imaging   CT AP W Contrast IMPRESSION: 1. No signs to suggest metastatic disease in the abdomen or pelvis. 2. Aortic atherosclerosis these,  as well as right coronary artery disease.   03/01/2020 Imaging   CT CAP W Contrast  IMPRESSION: 1. Stable exam. No new or progressive findings to suggest recurrent or metastatic disease. 2. Status post right hemicolectomy. 3. Aortic Atherosclerosis (ICD10-I70.0).   09/07/2020 Imaging   CT AP  IMPRESSION: 1. Status post right hemicolectomy, without recurrent or metastatic disease. 2. Coronary artery atherosclerosis. Aortic Atherosclerosis (ICD10-I70.0).   09/24/2021 Imaging   CT CAP  IMPRESSION: 1. Status post partial right hemicolectomy and ileocolic anastomosis. 2. No evidence of recurrent or metastatic disease in the chest, abdomen, or pelvis. 3. Mild, diffuse bilateral bronchial wall thickening, consistent with nonspecific infectious or inflammatory bronchitis. 4. Hepatic steatosis. 5. Prostatomegaly. Thickening of the decompressed urinary bladder, likely secondary to chronic outlet obstruction. 6. Coronary artery disease.   Aortic Atherosclerosis (ICD10-I70.0).      INTERVAL HISTORY:  Blake OLAZABALis here for a follow up of colon cancer. He was last seen by NP Lacie on 06/04/22. He presents to the clinic accompanied by interpreter JGregary Signs  He is clinically doing well, denies any pain, abdominal discomfort, or other new complaints.  He reports that he has been drinking a new "juice" which he saw on internet for his diabetes, sounds like it contains some sugar and carbohydrate.  He was noticed to have blood glucose 542 today, he forgot to take the diabetic medicine this morning.  He did take prednisone before his CT scan yesterday.  He denies any new symptoms.  All other systems were reviewed with the patient and are negative.  MEDICAL HISTORY:  Past Medical History:  Diagnosis Date   Appendicitis 10/21/2018   Diabetes mellitus    Follicular lymphoma ([place]), 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   Qualifier: Diagnosis of  By: [name], [name]    GI bleed 11/05/2018    Hypercholesterolemia    Hypertension 11/27/2011   Lymphoma ([place])    gets annual chemo last tx Feb 2013   TOBACCO USE, QUIT 08/18/2009   Qualifier: Diagnosis of  By: Drue Flirt  MD, Merrily Brittle      SURGICAL HISTORY: Past Surgical History:  Procedure Laterality Date   ESOPHAGOGASTRODUODENOSCOPY (EGD) WITH PROPOFOL N/A 11/05/2018   Procedure: ESOPHAGOGASTRODUODENOSCOPY (EGD) WITH PROPOFOL;  Surgeon: [name], MD;  Location[facility] ENDOSCOPY;  Service: Endoscopy;  Laterality: N/A;   IR IMAGING GUIDED PORT INSERTION  11/24/2018   IR REMOVAL TUN ACCESS W/ PORT W/O FL MOD SED  11/11/2021   LAPAROSCOPIC APPENDECTOMY N/A 10/21/2018   Procedure: Exploratory laparotomy right hemicolectomy;  Surgeon: [name], MD;  Location: [facility];  Service: General;  Laterality: N/A;    I have reviewed the social history and family history with the patient and they are unchanged from previous note.  ALLERGIES:  is allergic to iohexol.  MEDICATIONS:  Current Outpatient Medications  Medication Sig Dispense Refill   Accu-Chek Softclix Lancets lancets Use as instructed 100 each 12   atorvastatin (LIPITOR) 20 MG tablet Take 1 tablet (20 mg total) by mouth daily. 90 tablet 3   Blood Glucose Monitoring Suppl (ACCU-CHEK AVIVA PLUS) w/Device KIT Inject 1 kit into the skin 2 (two) times daily. 1 kit 0   diphenhydrAMINE (BENADRYL) 50 MG tablet Take 1 tablet (50 mg total) by mouth once for 1 dose. Take 1 tab 1 hour before CT scan., 1 tablet 0   empagliflozin (JARDIANCE) 10 MG TABS tablet Take 1 tablet (10 mg total) by mouth daily. 90 tablet 3   glipiZIDE (GLUCOTROL XL) 10 MG 24 hr tablet Take 1 tablet (10 mg total) by mouth daily with breakfast. 90 tablet 0   glucose blood (ACCU-CHEK AVIVA PLUS) test strip Use as instructed 100 each 12   metFORMIN (GLUCOPHAGE) 1000 MG tablet Take 1 tablet (1,000 mg total) by mouth 2 (two) times daily with a meal. 180 tablet 3   omeprazole (PRILOSEC) 20 MG capsule Take 1 capsule  (20 mg total) by mouth daily. 30 capsule 3   predniSONE (DELTASONE) 50 MG tablet Take 1 tablet by mouth 13 hours, 7 hours and 1 hour before CT scan. 3 tablet 0   sitaGLIPtin (JANUVIA) 50 MG tablet Take 1 tablet (50 mg total) by mouth daily. 90 tablet 0   No current facility-administered medications for this visit.    PHYSICAL EXAMINATION: ECOG PERFORMANCE STATUS: 0 - Asymptomatic  Vitals:   10/15/22 1223  BP: (!) 157/82  Pulse: 86  Resp: 18  Temp: 97.9 F (36.6 C)  SpO2: 100%   Wt Readings from Last 3 Encounters:  10/15/22 191 lb 3.2 oz (86.7 kg)  07/09/22 189 lb (85.7 kg)  07/02/22 187 lb (84.8 kg)     GENERAL:alert, no distress and comfortable SKIN: skin color, texture, turgor are normal, no rashes or significant lesions EYES: normal, Conjunctiva are pink and non-injected, sclera clear Musculoskeletal:no cyanosis of digits and no clubbing  NEURO: alert & oriented x 3 with fluent speech, no focal motor/sensory deficits  LABORATORY DATA:  I have reviewed the data as listed  Latest Ref Rng & Units 10/15/2022   11:18 AM 07/09/2022   10:41 AM 06/04/2022    9:04 AM  CBC  WBC 4.0 - 10.5 K/uL 9.8  6.5  8.7   Hemoglobin 13.0 - 17.0 g/dL 15.0  14.6  14.7   Hematocrit 39.0 - 52.0 % 41.4  43.0  42.1   Platelets 150 - 400 K/uL 186  154  221         Latest Ref Rng & Units 10/15/2022   11:18 AM 10/03/2022    4:43 PM 06/04/2022    9:04 AM  CMP  Glucose 70 - 99 mg/dL 542   303   BUN 8 - 23 mg/dL 26   18   Creatinine 0.61 - 1.24 mg/dL 1.24  1.00  1.01   Sodium 135 - 145 mmol/L 129   134   Potassium 3.5 - 5.1 mmol/L 4.6   5.3   Chloride 98 - 111 mmol/L 97   101   CO2 22 - 32 mmol/L 22   27   Calcium 8.9 - 10.3 mg/dL 9.2   9.7   Total Protein 6.5 - 8.1 g/dL 6.4   6.5   Total Bilirubin 0.3 - 1.2 mg/dL 0.6   0.6   Alkaline Phos 38 - 126 U/L 76   71   AST 15 - 41 U/L 13   15   ALT 0 - 44 U/L 15   15       RADIOGRAPHIC STUDIES: I have personally reviewed the radiological  images as listed and agreed with the findings in the report. No results found.    No orders of the defined types were placed in this encounter.  All questions were answered. The patient knows to call the clinic with any problems, questions or concerns. No barriers to learning was detected. The total time spent in the appointment was 30 minutes.     [name], MD 10/15/2022   I, Wilburn Mylar, am acting as scribe for [name], MD.   I have reviewed the above documentation for accuracy and completeness, and I agree with the above.

## 2022-10-16 ENCOUNTER — Encounter: Payer: Self-pay | Admitting: Family Medicine

## 2022-10-16 ENCOUNTER — Ambulatory Visit (INDEPENDENT_AMBULATORY_CARE_PROVIDER_SITE_OTHER): Payer: Medicare Other | Admitting: Family Medicine

## 2022-10-16 VITALS — BP 112/68 | HR 91 | Ht 62.0 in | Wt 190.8 lb

## 2022-10-16 DIAGNOSIS — E1165 Type 2 diabetes mellitus with hyperglycemia: Secondary | ICD-10-CM | POA: Diagnosis present

## 2022-10-16 LAB — POCT GLYCOSYLATED HEMOGLOBIN (HGB A1C): HbA1c, POC (controlled diabetic range): 10.4 % — AB (ref 0.0–7.0)

## 2022-10-16 LAB — GLUCOSE, POCT (MANUAL RESULT ENTRY): POC Glucose: 322 mg/dl — AB (ref 70–99)

## 2022-10-16 MED ORDER — EMPAGLIFLOZIN 10 MG PO TABS: 10.0000 mg | ORAL_TABLET | Freq: Every day | ORAL | 1 refills | Status: DC

## 2022-10-16 NOTE — Patient Instructions (Addendum)
It was nice seeing you today!  Take metformin 1000 mg twice a day, glipizide 10 mg once a day. Start Jardiance 10 mg once a day.  Tome metformina 1000 mg [employer], glipizida 10 mg una Goff. Comience con Jardiance 10 mg una vez al da.  Programe una visita con el Dr. Oleh Genin en las prximas semanas para analizar su documentacin sobre diabetes y discapacidad.  Stay well, Blake Button, MD [place] [phone]  --  Make sure to check out at the front desk before you leave today.  Please arrive at least 15 minutes prior to your scheduled appointments.  If you had blood work today, I will send you a MyChart message or a letter if results are normal. Otherwise, I will give you a call.  If you had a referral placed, they will call you to set up an appointment. Please give [place] a call if you don't hear back in the next 2 weeks.  If you need additional refills before your next appointment, please call your pharmacy first.

## 2022-10-16 NOTE — Assessment & Plan Note (Addendum)
Uncontrolled, worsening.  Likely due to medication nonadherence.  Unsure why he stopped Sitagliptin.  He is hesitant to start injectable medication. - continue metformin 1000 mg BID - continue glipizide 10 mg - start empagliflozin 10 mg  (new rx sent) - d/c sitagliptin (does not have high A1c lowering efficacy) - at follow-up consider uptitrating empagliflozin or starting Rybelsus - may benefit from pharmacy clinic follow-up, will send message

## 2022-10-16 NOTE — Progress Notes (Signed)
    SUBJECTIVE:   CHIEF COMPLAINT / HPI:  Chief Complaint  Patient presents with   Sugar levels    Patient was seen by oncologist yesterday and noted to have significantly elevated blood sugar and was advised to be seen today.  He was also requesting handicap paperwork filled out today.  He does not have any paperwork with him.  T2DM Medications: metformin 1000 mg BID, sitagliptin 50 mg, glipizide 10 mg, empagliflozin 10 mg. He reports he is only taking the metformin twice daily and glipizide. Says he was switched off of the sitagliptin. He doesn't know anything about the empagliflozin. He does not want anything injectable.  PERTINENT  PMH / PSH: T2DM, HTN, colon cancer, follicular lymphoma  Patient Care Team: [name], DO as PCP - General (Family Medicine) [name], MD as Consulting Physician (Colon and Rectal Surgery) [name], MD as Consulting Physician (Hematology)   OBJECTIVE:   BP 112/68   Pulse 91   Ht '5\' 2"'$  (1.575 m)   Wt 190 lb 12.8 oz (86.5 kg)   SpO2 99%   BMI 34.90 kg/m   Physical Exam Constitutional:      General: He is not in acute distress. Cardiovascular:     Rate and Rhythm: Normal rate and regular rhythm.  Pulmonary:     Effort: Pulmonary effort is normal. No respiratory distress.     Breath sounds: Normal breath sounds.  Neurological:     Mental Status: He is alert.         10/16/2022    9:12 AM  Depression screen PHQ 2/9  Moving slowly or fidgety/restless 0  Suicidal thoughts 0  Difficult doing work/chores Very difficult     {Show previous vital signs (optional):23777}  Last hemoglobin A1c Lab Results  Component Value Date   HGBA1C 10.4 (A) 10/16/2022      ASSESSMENT/PLAN:   Type 2 diabetes mellitus with hyperglycemia (HCC) Uncontrolled, worsening.  Likely due to medication nonadherence.  Unsure why he stopped Sitagliptin.  He is hesitant to start injectable medication. - continue metformin 1000 mg BID -  continue glipizide 10 mg - start empagliflozin 10 mg  (new rx sent) - d/c sitagliptin (does not have high A1c lowering efficacy) - at follow-up consider uptitrating empagliflozin or starting Rybelsus - may benefit from pharmacy clinic follow-up, will send message   Advised to follow-up with PCP to discuss handicap paperwork.  Return in about 4 weeks (around 11/13/2022) for f/u diabetes, handicap paperwork.   Zola Button, MD [place]

## 2022-10-20 ENCOUNTER — Other Ambulatory Visit: Payer: Self-pay

## 2022-10-20 ENCOUNTER — Telehealth: Payer: Self-pay

## 2022-10-20 DIAGNOSIS — C182 Malignant neoplasm of ascending colon: Secondary | ICD-10-CM

## 2022-10-20 NOTE — Telephone Encounter (Signed)
Notified patient of results w/ pac[place] interpreters

## 2022-10-20 NOTE — Telephone Encounter (Signed)
-----   Message from [name], MD sent at 10/18/2022 10:13 AM EST ----- Please let pt know that his tumor marker was slightly elevated, likley non-specific since he just had a negative CT scan. Please schedule him to come in for a repeated lab including CEA in 2 months, thanks  [name]

## 2022-12-05 ENCOUNTER — Encounter: Payer: Self-pay | Admitting: Family Medicine

## 2022-12-05 ENCOUNTER — Ambulatory Visit (INDEPENDENT_AMBULATORY_CARE_PROVIDER_SITE_OTHER): Payer: Medicare Other | Admitting: Family Medicine

## 2022-12-05 VITALS — BP 139/67 | HR 72 | Ht 62.0 in | Wt 192.2 lb

## 2022-12-05 DIAGNOSIS — E1165 Type 2 diabetes mellitus with hyperglycemia: Secondary | ICD-10-CM

## 2022-12-05 DIAGNOSIS — R109 Unspecified abdominal pain: Secondary | ICD-10-CM | POA: Diagnosis not present

## 2022-12-05 DIAGNOSIS — G629 Polyneuropathy, unspecified: Secondary | ICD-10-CM | POA: Diagnosis not present

## 2022-12-05 DIAGNOSIS — R051 Acute cough: Secondary | ICD-10-CM | POA: Diagnosis not present

## 2022-12-05 MED ORDER — ACCU-CHEK GUIDE VI STRP: ORAL_STRIP | 12 refills | Status: DC

## 2022-12-05 MED ORDER — ACCU-CHEK GUIDE W/DEVICE KIT: 1.0000 | PACK | Freq: Every day | 0 refills | Status: DC

## 2022-12-05 MED ORDER — ACCU-CHEK SOFTCLIX LANCETS MISC: 12 refills | Status: DC

## 2022-12-05 MED ORDER — OMEPRAZOLE 20 MG PO CPDR: 20.0000 mg | DELAYED_RELEASE_CAPSULE | Freq: Every day | ORAL | 3 refills | Status: DC

## 2022-12-05 MED ORDER — BENZONATATE 100 MG PO CAPS: 100.0000 mg | ORAL_CAPSULE | Freq: Three times a day (TID) | ORAL | 0 refills | Status: DC | PRN

## 2022-12-05 MED ORDER — GABAPENTIN 100 MG PO CAPS: 100.0000 mg | ORAL_CAPSULE | Freq: Every day | ORAL | 1 refills | Status: DC

## 2022-12-05 NOTE — Progress Notes (Cosign Needed)
    SUBJECTIVE:   CHIEF COMPLAINT / HPI:   Stomach pain - Patient feels like he has an infection in his stomach - He is having pain and burning sensation  - Gets somewhat better with drinking water - Has some "[place] pills" that he thinks may have penicillin - Denies diarrhea or chest pains - Now having regular bowel movements after 1 day of constipation  Cough - Started Tuesday, dry cough without other symptoms than headache - Denies any fever or sick exposures - Taking Tylenol with some improvement  Leg Pain - Feels that it is due to the diabetes  - Is able to walk at least a block on his own - Was wondering if he would be able to get a disability placard  - Feels it on his knees and feet at night  Diabetes - Wanted to have his A1c checked today - Does not check his sugars at home as he doesn't have a meter - Would like to have a glucometer  PERTINENT  PMH / PSH: Reviewed  OBJECTIVE:   BP 139/67   Pulse 72   Ht '5\' 2"'$  (1.575 m)   Wt 192 lb 3.2 oz (87.2 kg)   SpO2 100%   BMI 35.15 kg/m   Gen: well-appearing, NAD CV: RRR, no m/r/g appreciated, no peripheral edema Pulm: CTAB, no wheezes/crackles GI: soft, non-tender, non-distended, no peritoneal signs, normal bowel sounds MSK: difficulty with getting up on exam table, no acute pain in b/l knee joints, full ROM of BLE, no obvious swelling or deformity, normal gait pattern observed with ambulation in the room  ASSESSMENT/PLAN:   Neuropathy Patient reporting BLE pain that keeps him up at night. Unclear based on description if this is related to neuropathy, but given poorly controlled diabetes, do feel that gabapentin would be an appropriate medication to trial as physical exam unremarkable. - Gabapentin '100mg'$  QHS PRN - Follow-up if no improvement or if worsening - Encouraged diabetic diet control  Type 2 diabetes mellitus with hyperglycemia ([place]) Counseled patient on medication compliance - No changes to glucose  lowering medications at this time - A1c in 2 months - Glucometer prescription sent   Cough Likely viral in nature, no focal consolidations on examination. Low suspicion for concerning etiology and lack of other symptoms including fevers.  - Tessalon '100mg'$  TID PRN - OTC medication recommendations given if unable to afford Tessalon - Return and ER precautions given   Stomach pain Possibly related to the viral process instigating his cough but cannot be sure. Pain is not severe and doesn't seem to be related to constipation or any type of gastroenteritis given lack of other symptoms. Offered H. Pylori testing based on patient's description of symptoms, but patient prefers to trial medication instead. - Trial of omeprazole - Return precautions given   [name], [place]

## 2022-12-05 NOTE — Assessment & Plan Note (Signed)
Counseled patient on medication compliance - No changes to glucose lowering medications at this time - A1c in 2 months - Glucometer prescription sent

## 2022-12-05 NOTE — Assessment & Plan Note (Signed)
Patient reporting BLE pain that keeps him up at night. Unclear based on description if this is related to neuropathy, but given poorly controlled diabetes, do feel that gabapentin would be an appropriate medication to trial as physical exam unremarkable. - Gabapentin '100mg'$  QHS PRN - Follow-up if no improvement or if worsening - Encouraged diabetic diet control

## 2022-12-05 NOTE — Patient Instructions (Addendum)
For your cough:  - I sent in a prescription for medication called Tessalon (benzonatate) - Make sure to keep this medication away from children - If insurance doesn't cover it, I recommend getting Robitussin or Mucinex over the counter  For your stomach pain - I am sending in a medication called omeprazole - Let our office know if you start having worsening pain or diarrhea  For your leg pain - I am sending in a medication called gabapentin - You can take one at night to see if it helps with the pain you are having that interrupts your sleep -  If it doesn't help, you don't have to take it  For your diabetes - I will work on getting a glucometer sent in that your insurance will cover  - If your insurance does not cover this glucometer, please let our office know      Para tu tos: - Envi una receta para un medicamento llamado Tessalon (benzonatato) - Asegrese de Theatre manager este medicamento fuera del alcance de los nios. - Si el seguro no lo cubre, recomiendo conseguir Robitussin o Mucinex sin receta  Para tu dolor de estmago - Le envo un medicamento llamado omeprazol. - Informe a nuestra oficina si comienza a Patent attorney o diarrea que empeoran.  Para tu dolor de piernas - Le envo un medicamento llamado gabapentina. - Puedes tomar uno por la noche para ver si te ayuda con el dolor que ests teniendo y que interrumpe tu sueo. - Si no ayuda, no es necesario que lo tomes.  Para tu diabetes - Trabajar para que me enven un glucmetro que su seguro [place]. - Si su seguro no cubre Tour manager, infrmelo a nuestra oficina.

## 2022-12-11 ENCOUNTER — Other Ambulatory Visit: Payer: Self-pay

## 2022-12-11 DIAGNOSIS — E1165 Type 2 diabetes mellitus with hyperglycemia: Secondary | ICD-10-CM

## 2022-12-11 MED ORDER — ACCU-CHEK SOFTCLIX LANCETS MISC: 12 refills | Status: DC

## 2022-12-11 MED ORDER — ACCU-CHEK GUIDE VI STRP: ORAL_STRIP | 12 refills | Status: DC

## 2022-12-11 MED ORDER — ACCU-CHEK AVIVA PLUS W/DEVICE KIT: 1.0000 | PACK | Freq: Two times a day (BID) | 0 refills | Status: DC

## 2022-12-15 ENCOUNTER — Inpatient Hospital Stay: Payer: Medicare Other | Attending: Hematology

## 2023-01-21 DIAGNOSIS — E113313 Type 2 diabetes mellitus with moderate nonproliferative diabetic retinopathy with macular edema, bilateral: Secondary | ICD-10-CM | POA: Diagnosis not present

## 2023-01-21 DIAGNOSIS — H34812 Central retinal vein occlusion, left eye, with macular edema: Secondary | ICD-10-CM | POA: Diagnosis not present

## 2023-01-21 DIAGNOSIS — H25813 Combined forms of age-related cataract, bilateral: Secondary | ICD-10-CM | POA: Diagnosis not present

## 2023-01-21 DIAGNOSIS — H35372 Puckering of macula, left eye: Secondary | ICD-10-CM | POA: Diagnosis not present

## 2023-02-23 ENCOUNTER — Ambulatory Visit: Payer: Self-pay | Admitting: *Deleted

## 2023-02-23 NOTE — Patient Outreach (Signed)
  Care Coordination   Initial Visit Note  Initial call without interpreter, second call with an interpreter ([place])   02/23/2023 Name: Blake Vaughn MRN: [id number] DOB: [birth year]  Blake Vaughn is a 62 y.o. year old male who sees Lilland, Lorrin Goodell, DO for primary care. I spoke with  Blake Vaughn by phone today.  Pt listened to NP introduction en Spanish, but he said he did not know me and preferred not to talk to me. NP then enlisted the services of a translator who called. The pt did not pick up the phone and he left another message in  Spanish for NP to please contact. (The interpreter, Blake Vaughn and NP had a conversation in [place] and he advised he thought my Spanish was good enough to talk to pts sufficiently.  What matters to the patients health and wellness today?  UNABLE TO ASK, PT DISCONNECTED THE PHONE.    Goals Addressed   None     SDOH assessments and interventions completed:  No     Care Coordination Interventions:  No, not indicated   Follow up plan: Follow up call scheduled for LATER THIS WEEK.    Encounter Outcome:  Pt. Scheduled   Taja Pentland C. Myrtie Neither, MSN, [facility] Gerontological Nurse Practitioner [facility] Care Management [phone]

## 2023-02-27 ENCOUNTER — Ambulatory Visit: Payer: Self-pay | Admitting: *Deleted

## 2023-02-27 NOTE — Patient Outreach (Signed)
  Care Coordination   02/27/2023 Name: Blake Vaughn MRN: [id number] DOB: [birth year]   Care Coordination Outreach Attempts:  A second unsuccessful outreach was attempted today to offer the patient with information about available care coordination services as a benefit of their health plan.     Follow Up Plan:  Additional outreach attempts will be made to offer the patient care coordination information and services.   Encounter Outcome:  An interpreter was engaged for this call. A message was left and a request for a return call was left. No Answer THN Tip this will not be part of the note when signed-REQUIRED REPORT FIELD DO NOT DELETE (Optional):[id number]}  Care Coordination Interventions:  No, not indicated    Prapti Grussing C. Myrtie Neither, MSN, [facility] Gerontological Nurse Practitioner [facility] Care Management [phone]

## 2023-03-14 ENCOUNTER — Ambulatory Visit (HOSPITAL_COMMUNITY)
Admission: EM | Admit: 2023-03-14 | Discharge: 2023-03-14 | Disposition: A | Discharge status: Home / Self Care | Payer: Medicare Other

## 2023-03-14 ENCOUNTER — Encounter (HOSPITAL_COMMUNITY): Payer: Self-pay

## 2023-03-14 DIAGNOSIS — G44209 Tension-type headache, unspecified, not intractable: Secondary | ICD-10-CM

## 2023-03-14 MED ORDER — BACLOFEN 5 MG PO TABS: 5.0000 mg | ORAL_TABLET | Freq: Every evening | ORAL | 0 refills | Status: DC | PRN

## 2023-03-14 MED ORDER — KETOROLAC TROMETHAMINE 30 MG/ML IJ SOLN
INTRAMUSCULAR | Status: AC
  Filled 2023-03-14: qty 1

## 2023-03-14 MED ORDER — KETOROLAC TROMETHAMINE 30 MG/ML IJ SOLN
30.0000 mg | Freq: Once | INTRAMUSCULAR | Status: AC
  Administered 2023-03-14: 30 mg via INTRAMUSCULAR

## 2023-03-14 NOTE — ED Triage Notes (Signed)
Pt is here for headache x 1wk . Pt has been taking Excedrin for the headaches nothing taken today. Pt  states has not been checking sugars . Pt states the headaches comes and goes and last for at least , and knees feels weak.

## 2023-03-14 NOTE — ED Provider Notes (Signed)
[facility]    CSN: [id number] Arrival date & time: 03/14/23  1120      History   Chief Complaint Chief Complaint  Patient presents with   Headache    HPI Blake Vaughn is a 62 y.o. male.   Patient presents today with an 8-day history of intermittent headache.  He is Spanish-speaking video interpreter was utilized during visit.  He reports that headache occurs randomly without identifiable trigger.  Pain is rated 3 on a 0-10 pain scale, described as throbbing, worse no aggravating relieving factors identified.  He has been taking Excedrin every 8 hours for the last several days which has provided temporary relief of symptoms.  Denies additional over-the-counter's.  Denies any recent medication changes.  Denies any recent head trauma.  He does have a past medical history of lymphoma but is followed closely by oncology.  He does have a history of headaches with similar presentation.  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  He has not had any additional brain imaging since that time.  He has not followed with a neurologist.  He denies any associated focal weakness, dysarthria, nausea, vomiting, photophobia.  He does report occasionally feeling generally weak when he has these episodes.    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   Qualifier: Diagnosis of  By: Arlyce DiceKaplan MD, Barbette Hairobert D    GI bleed 11/05/2018   Hypercholesterolemia    Hypertension 11/27/2011   Lymphoma    gets annual chemo last tx Feb 2013   TOBACCO USE, QUIT 08/18/2009   Qualifier: Diagnosis of  By: Lanier PrudeBolden  MD, Taineisha      Patient Active Problem List   Diagnosis Date Noted   Blurred vision 04/30/2021   Microalbuminuria 06/05/2020   Anxiety  disorder due to multiple medical problems 12/24/2019   Neuropathy 07/30/2019   Port-A-Cath in place 11/25/2018   Cancer of right colon 11/12/2018   Syncope due to orthostatic hypotension 11/05/2018   Hypoalbuminemia 11/05/2018   Hypoproteinemia 11/05/2018   Uses Spanish as primary spoken language 10/23/2018   Obesity (BMI 30-39.9) 10/23/2018   Mass of cecum s/p right colectomy 10/21/2018 10/22/2018   Nocturnal leg cramps 07/16/2017   HTN (hypertension) 11/27/2011   Pure hypercholesterolemia 09/24/2009   ERECTILE DYSFUNCTION, 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   Type 2 diabetes mellitus with hyperglycemia 07/09/2009   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/09/2009   Follicular lymphoma (HCC), 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    Past Surgical History:  Procedure Laterality Date   ESOPHAGOGASTRODUODENOSCOPY (EGD) WITH PROPOFOL N/A 11/05/2018   Procedure: ESOPHAGOGASTRODUODENOSCOPY (EGD) WITH PROPOFOL;  Surgeon: Bernette RedbirdBuccini, Robert, MD;  [facility];  Service: Endoscopy;  Laterality: N/A;   IR IMAGING GUIDED PORT INSERTION  11/24/2018   IR REMOVAL TUN ACCESS W/ PORT W/O FL MOD SED  11/11/2021   LAPAROSCOPIC APPENDECTOMY N/A 10/21/2018   Procedure: Exploratory laparotomy right hemicolectomy;  Surgeon: Andria MeuseWhite, Christopher M, MD;  Location: [facility];  Service: General;  Laterality: N/A;       Home Medications    Prior to Admission medications   Medication Sig Start Date End Date Taking? Authorizing Provider  Accu-Chek Softclix Lancets lancets Use as instructed 12/05/22  Yes Lilland, Alana, DO  Accu-Chek Softclix Lancets lancets Use lancets to  check sugars 2-3 times per day 12/11/22  Yes Lilland, Alana, DO  baclofen 5 MG TABS Take 1 tablet (5 mg total) by mouth at bedtime as needed for muscle spasms. 03/14/23  Yes Khalil Szczepanik K, [name]  empagliflozin (JARDIANCE) 10 MG TABS tablet Take 1 tablet (10 mg total) by mouth daily. 10/16/22  Yes Littie Deeds, MD  glipiZIDE (GLUCOTROL XL) 10 MG 24 hr tablet Take 1 tablet (10 mg total) by  mouth daily with breakfast. 07/11/22  Yes Lilland, Alana, DO  glucose blood (ACCU-CHEK AVIVA PLUS) test strip Use as instructed 07/30/19  Yes Talbert Forest, [place], DO  glucose blood (ACCU-CHEK GUIDE) test strip Use strips to check blood sugar 2-3 times per day 12/11/22  Yes Lilland, Alana, DO  metFORMIN (GLUCOPHAGE) 1000 MG tablet Take 1 tablet (1,000 mg total) by mouth 2 (two) times daily with a meal. 07/11/22  Yes Lilland, Alana, DO  omeprazole (PRILOSEC) 20 MG capsule Take 1 capsule (20 mg total) by mouth daily. 12/05/22  Yes Lilland, Alana, DO  atorvastatin (LIPITOR) 20 MG tablet Take 1 tablet (20 mg total) by mouth daily. 07/09/22   Lilland, Alana, DO  Blood Glucose Monitoring Suppl (ACCU-CHEK AVIVA PLUS) w/Device KIT Inject 1 kit into the skin 2 (two) times daily. Use kit to check sugars 2-3 times per day 12/11/22   Lilland, Alana, DO  Blood Glucose Monitoring Suppl (ACCU-CHEK GUIDE) w/Device KIT 1 kit by Does not apply route daily. 12/05/22   Lilland, Alana, DO  diphenhydrAMINE (BENADRYL) 50 MG tablet Take 1 tablet (50 mg total) by mouth once for 1 dose. Take 1 tab 1 hour before CT scan., 10/01/22 10/01/22  Pollyann Samples, NP  gabapentin (NEURONTIN) 100 MG capsule Take 1 capsule (100 mg total) by mouth at bedtime. 12/05/22   [name], DO    Family History Family History  Problem Relation Age of Onset   Diabetes Mother    Diabetes Father    Renal Disease Brother    Diabetes Brother    Cancer Brother        lymphoma     Social History Social History   Tobacco Use   Smoking status: Former    Packs/day: 0.50    Years: 30.00    Additional pack years: 0.00    Total pack years: 15.00    Types: Cigarettes    Quit date: 12/08/2004    Years since quitting: 18.2   Smokeless tobacco: Former    Quit date: 11/20/2006  Vaping Use   Vaping Use: Never used  Substance Use Topics   Alcohol use: No   Drug use: No     Allergies   Iohexol   Review of Systems Review of Systems   Constitutional:  Positive for activity change. Negative for appetite change, fatigue and fever.  Eyes:  Negative for photophobia and visual disturbance.  Respiratory:  Negative for cough and shortness of breath.   Cardiovascular:  Negative for chest pain, palpitations and leg swelling.  Gastrointestinal:  Negative for abdominal pain, diarrhea, nausea and vomiting.  Musculoskeletal:  Negative for arthralgias, myalgias and neck pain.  Neurological:  Positive for headaches. Negative for dizziness, seizures, syncope, speech difficulty, weakness, light-headedness and numbness.     Physical Exam Triage Vital Signs ED Triage Vitals  Enc Vitals Group     BP 03/14/23 1129 (!) 150/84     Pulse Rate 03/14/23 1129 83     Resp 03/14/23 1129 16     Temp 03/14/23 1129 98 F (  36.7 C)     Temp Source 03/14/23 1129 Oral     SpO2 03/14/23 1129 97 %     Weight --      Height --      Head Circumference --      Peak Flow --      Pain Score 03/14/23 1144 7     Pain Loc --      Pain Edu? --      Excl. in GC? --    No data found.  Updated Vital Signs BP 130/82   Pulse 83   Temp 98 F (36.7 C) (Oral)   Resp 16   SpO2 97%   Visual Acuity Right Eye Distance:   Left Eye Distance:   Bilateral Distance:    Right Eye Near:   Left Eye Near:    Bilateral Near:     Physical Exam Vitals reviewed.  Constitutional:      General: He is awake.     Appearance: Normal appearance. He is well-developed. He is not ill-appearing.     Comments: Very pleasant male appears stated age in no acute distress sitting comfortably in exam room  HENT:     Head: Normocephalic and atraumatic.     Right Ear: Tympanic membrane, ear canal and external ear normal. No hemotympanum.     Left Ear: Tympanic membrane, ear canal and external ear normal. No hemotympanum.     Nose: Nose normal.     Mouth/Throat:     Pharynx: Uvula midline. No oropharyngeal exudate or posterior oropharyngeal erythema.  Eyes:      Extraocular Movements: Extraocular movements intact.     Conjunctiva/sclera: Conjunctivae normal.     Pupils: Pupils are equal, round, and reactive to light.     Comments: Arcus senilis bilaterally  Cardiovascular:     Rate and Rhythm: Normal rate and regular rhythm.     Heart sounds: Normal heart sounds, S1 normal and S2 normal. No murmur heard. Pulmonary:     Effort: Pulmonary effort is normal. No accessory muscle usage or respiratory distress.     Breath sounds: Normal breath sounds. No stridor. No wheezing, rhonchi or rales.     Comments: Clear to auscultation bilaterally Abdominal:     General: Bowel sounds are normal.     Palpations: Abdomen is soft.     Tenderness: There is no abdominal tenderness. There is no right CVA tenderness, left CVA tenderness, guarding or rebound.  Musculoskeletal:     Cervical back: Normal range of motion and neck supple. No spinous process tenderness or muscular tenderness.     Comments: Strength 5/5 bilateral upper and lower extremities  Neurological:     General: No focal deficit present.     Mental Status: He is alert and oriented to person, place, and time.     Cranial Nerves: Cranial nerves 2-12 are intact.     Motor: Motor function is intact.     Coordination: Coordination is intact. Romberg sign negative.     Gait: Gait is intact.     Comments: Cranial nerves II through XII grossly intact.  Psychiatric:        Behavior: Behavior is cooperative.      UC Treatments / Results  Labs (all labs ordered are listed, but only abnormal results are displayed) Labs Reviewed - No data to display  EKG   Radiology No results found.  Procedures Procedures (including critical care time)  Medications Ordered in UC Medications  ketorolac (TORADOL) 30  MG/ML injection 30 mg (30 mg Intramuscular Given 03/14/23 1216)    Initial Impression / Assessment and Plan / UC Course  I have reviewed the triage vital signs and the nursing notes.  Pertinent  labs & imaging results that were available during my care of the patient were reviewed by me and considered in my medical decision making (see chart for details).     Patient is well-appearing, afebrile, nontoxic, nontachycardic.  Vital signs and physical exam are reassuring today with no acute neurological defect that would warrant emergent evaluation or imaging.  Patient reports that this is a mild headache and not to the worst of his life.  He was given 30 mg of Toradol with significant improvement and almost complete resolution of headache.  He was instructed to take additional NSAIDs for the next 12 hours but can use acetaminophen/Tylenol for breakthrough pain.  Recommend that he rest and drink plenty fluid.  We discussed that his symptoms could be related to medication overuse and so encouraged him to discontinue or decrease at least the use of over-the-counter medications.  He was given baclofen to help with headache pain with instruction to use this only at night as it can be sedating and he is not to drive or drink alcohol with taking it.  Recommended follow-up with his primary care.  We discussed that if he has any changing or worsening symptoms including worsening headache, worsening of his life, dizziness, nausea, vomiting, visual disturbance, weakness he needs to go to the emergency room.  Strict return precautions given.  All questions answered to patient satisfaction.  Final Clinical Impressions(s) / UC Diagnoses   Final diagnoses:  Tension headache     Discharge Instructions      Me alegro que te sientas mejor despus de tu medicacin. No tome ningn AINE durante las prximas 12 horas, incluidos aspirina, ibuprofeno/Advil, naproxeno/Aleve, Excedrin. Puede usar acetaminofn/Tylenol para Chief Technology Officer irruptivo. Tome baclofeno por la noche. Esto le produce sueo, as que no conduzca ni beba alcohol mientras lo toma. Asegrese de descansar y beber muchos lquidos. Haga un seguimiento con su  PCP la prxima semana. Si en algn momento algo empeora y tiene dolor de cabeza intenso, nuseas, vmitos, dificultad para hablar, confusin, el peor dolor de cabeza de su vida, debe acudir a la sala de emergencias de inmediato.     ED Prescriptions     Medication Sig Dispense Auth. Provider   baclofen 5 MG TABS Take 1 tablet (5 mg total) by mouth at bedtime as needed for muscle spasms. 7 tablet Nohelia Valenza, Noberto Retort, [name]      PDMP not reviewed this encounter.   Jeani Hawking, [name] 03/14/23 1309

## 2023-03-14 NOTE — Discharge Instructions (Signed)
Me alegro que te sientas mejor despus de tu medicacin. No tome ningn AINE durante las prximas 12 horas, incluidos aspirina, ibuprofeno/Advil, naproxeno/Aleve, Excedrin. Puede usar acetaminofn/Tylenol para Chief Technology Officer irruptivo. Tome baclofeno por la noche. Esto le produce sueo, as que no conduzca ni beba alcohol mientras lo toma. Asegrese de descansar y beber muchos lquidos. Haga un seguimiento con su PCP la prxima semana. Si en algn momento algo empeora y tiene dolor de cabeza intenso, nuseas, vmitos, dificultad para hablar, confusin, el peor dolor de cabeza de su vida, debe acudir a la sala de emergencias de inmediato.

## 2023-03-23 ENCOUNTER — Telehealth: Payer: Self-pay | Admitting: *Deleted

## 2023-03-23 NOTE — Patient Outreach (Signed)
  Care Coordination  Potential Health Equity Plan Participant  03/23/2023 Name: Blake Vaughn MRN: [id number] DOB: 21-Aug-1961   Care Coordination Outreach Attempts:  A third unsuccessful outreach was attempted today to offer the patient with information about available care coordination services as a benefit of their health plan.   Follow Up Plan:  No further outreach attempts will be made at this time. We have been unable to contact the patient to offer or enroll patient in care coordination services  Encounter Outcome:  No Answer   Care Coordination Interventions:  Yes, provided     Interventions Today    Flowsheet Row Most Recent Value  General Interventions   General Interventions Discussed/Reviewed Communication with  [A Spanish speaking interpreter was engaged for this final call.  Pt hung up as soon as interpreter introduced the purpose of the call.]      Wetzel Meester C. Burgess Estelle, MSN, [facility] Gerontological Nurse Practitioner [facility] Care Management [phone]

## 2023-03-24 DIAGNOSIS — H35372 Puckering of macula, left eye: Secondary | ICD-10-CM | POA: Diagnosis not present

## 2023-03-27 ENCOUNTER — Telehealth: Payer: Self-pay | Admitting: Internal Medicine

## 2023-03-27 NOTE — Telephone Encounter (Signed)
Rescheduled 05/08 appointment due to scheduling conflict, patient has been called and notified. 

## 2023-04-02 ENCOUNTER — Encounter: Payer: Self-pay | Admitting: Hematology

## 2023-04-02 ENCOUNTER — Ambulatory Visit (INDEPENDENT_AMBULATORY_CARE_PROVIDER_SITE_OTHER): Payer: Medicare Other | Admitting: Student

## 2023-04-02 VITALS — BP 153/84 | HR 88 | Ht 62.0 in | Wt 188.0 lb

## 2023-04-02 DIAGNOSIS — R519 Headache, unspecified: Secondary | ICD-10-CM | POA: Diagnosis not present

## 2023-04-02 DIAGNOSIS — G8929 Other chronic pain: Secondary | ICD-10-CM | POA: Diagnosis not present

## 2023-04-02 DIAGNOSIS — K219 Gastro-esophageal reflux disease without esophagitis: Secondary | ICD-10-CM

## 2023-04-02 DIAGNOSIS — E1165 Type 2 diabetes mellitus with hyperglycemia: Secondary | ICD-10-CM | POA: Diagnosis not present

## 2023-04-02 DIAGNOSIS — I10 Essential (primary) hypertension: Secondary | ICD-10-CM | POA: Diagnosis not present

## 2023-04-02 LAB — GLUCOSE, POCT (MANUAL RESULT ENTRY): POC Glucose: 206 mg/dL — AB (ref 70–99)

## 2023-04-02 MED ORDER — KETOROLAC TROMETHAMINE 15 MG/ML IJ SOLN: 15.0000 mg | Freq: Once | INTRAMUSCULAR | Status: DC

## 2023-04-02 MED ORDER — PANTOPRAZOLE SODIUM 40 MG PO TBEC: 40.0000 mg | DELAYED_RELEASE_TABLET | Freq: Every day | ORAL | 0 refills | Status: DC

## 2023-04-02 MED ORDER — KETOROLAC TROMETHAMINE 30 MG/ML IJ SOLN
30.0000 mg | Freq: Once | INTRAMUSCULAR | Status: AC
  Administered 2023-04-02: 15 mg via INTRAMUSCULAR

## 2023-04-02 NOTE — Progress Notes (Signed)
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 based on MRI and MRV  No new symptoms, headache is the same as previous  No blurry vision No dizziness or LOC   Stomach Pain Burning generalized abdominal pain that has been present before  Rx Omeprazole previously, but unsure if taken  Associates this with his headache  No nausea or vomiting  No bleeding   PERTINENT  PMH / PSH:   Hypertension Type 2 diabetes Neuropathy Follicular lymphoma, sees oncology regularly, had elevated CEA slightly on last check with next appointment scheduled already on May 7 Anxiety Obesity Spanish speaking HLD  Patient Care Team: [name], DO as PCP - General (Family Medicine) Andria Meuse, MD as Consulting Physician (Colon and Rectal Surgery) Malachy Mood, MD as Consulting Physician (Hematology) OBJECTIVE:  BP (!) 153/84   Pulse 88   Ht  (1.575 m)   Wt 188 lb (85.3 kg)   SpO2 95%   BMI 34.39 kg/m  Physical Exam  General: Alert and oriented in no apparent distress Heart: Regular rate and rhythm with no murmurs appreciated Lungs: CTA bilaterally, no wheezing Abdomen: Bowel sounds present, no abdominal pain, no distension, soft  Skin: Warm and dry Extremities: No lower extremity edema Neuro: CN II: PERRL CN III, IV,VI: EOMI CV V: Normal sensation in V1, V2, V3 CVII: Symmetric smile and brow raise CN VIII: Normal hearing CN XI: 5/5 shoulder shrug UE and LE strength 5/5 Normal sensation in UE and LE bilaterally  No ataxia with finger to nose, normal heel to shin   ASSESSMENT/PLAN:  Type 2 diabetes mellitus  with hyperglycemia, unspecified whether long term insulin use Assessment & Plan: Checked point-of-care glucose as he had abdominal pain/headache and has a history of very elevated sugars in the past.  CBG today was 222, seems to run high.  Instructed the patient to make a follow-up visit with Dr. Raymondo Band to discuss options, may be a good GLP-1 candidate, for DM control.  Orders: -     POCT glucose (manual entry)  Chronic nonintractable headache, unspecified headache type Assessment & Plan: Chronic headache without any new neurologic findings or new symptoms of note.  Possibly with relation to chronic ischemic changes.  Continue with Tylenol, give Toradol shot today.  If symptoms persist, patient instructed to return in 1 month for further discussion.  He may benefit from a trip to the headache clinic for magnesium oxide.  No need for up-to-date CT at this moment with no change in symptoms and completely normal exam.  Orders: -     Ketorolac Tromethamine  Gastroesophageal reflux disease without esophagitis Assessment & Plan: PPI 1x daily for next month Monitor symptoms  H pylori testing, call with results   Orders: -     Pantoprazole Sodium; Take 1 tablet (40 mg total) by mouth daily.  Dispense: 30 tablet; Refill: 0 -     H. pylori breath test  Primary hypertension Assessment & Plan: Elevated pressure today, previous at goal not on medications. Nursing visit in 1 week, start medication if persistently elevated.     Return for With Dr.  Griffin Basil, MD 04/02/2023, 3:14 PM PGY-2, [facility]

## 2023-04-02 NOTE — Assessment & Plan Note (Signed)
Elevated pressure today, previous at goal not on medications. Nursing visit in 1 week, start medication if persistently elevated.

## 2023-04-02 NOTE — Assessment & Plan Note (Signed)
Chronic headache without any new neurologic findings or new symptoms of note.  Possibly with relation to chronic ischemic changes.  Continue with Tylenol, give Toradol shot today.  If symptoms persist, patient instructed to return in 1 month for further discussion.  He may benefit from a trip to the headache clinic for magnesium oxide.  No need for up-to-date CT at this moment with no change in symptoms and completely normal exam.

## 2023-04-02 NOTE — Patient Instructions (Addendum)
It was great to see you today! Thank you for choosing [facility] for your primary care. Blake Vaughn was seen for follow up.  Today we addressed: PLEASE see your oncologist for your appt  WE will continue with a toradol shot  Take Tylenol as needed  Follow up with pcp about trulicity  Take pantoprazole once a day for the next thirty days    If you haven't already, sign up for My Chart to have easy access to your labs results, and communication with your primary care physician.  I recommend that you always bring your medications to each appointment as this makes it easy to ensure you are on the correct medications and helps [place] not miss refills when you need them. Call the clinic at [phone] if your symptoms worsen or you have any concerns.  You should return to our clinic Return for With Dr. Raymondo Band . Please arrive 15 minutes before your appointment to ensure smooth check in process.  We appreciate your efforts in making this happen.  Thank you for allowing me to participate in your care, Alfredo Martinez, MD 04/02/2023, 2:21 PM PGY-2, [facility]

## 2023-04-02 NOTE — Assessment & Plan Note (Signed)
Checked point-of-care glucose as he had abdominal pain/headache and has a history of very elevated sugars in the past.  CBG today was 222, seems to run high.  Instructed the patient to make a follow-up visit with Dr. Raymondo Band to discuss options, may be a good GLP-1 candidate, for DM control.

## 2023-04-02 NOTE — Assessment & Plan Note (Signed)
PPI 1x daily for next month Monitor symptoms  H pylori testing, call with results

## 2023-04-04 LAB — H. PYLORI BREATH TEST: H pylori Breath Test: NEGATIVE

## 2023-04-07 DIAGNOSIS — H35372 Puckering of macula, left eye: Secondary | ICD-10-CM | POA: Diagnosis not present

## 2023-04-10 ENCOUNTER — Ambulatory Visit (INDEPENDENT_AMBULATORY_CARE_PROVIDER_SITE_OTHER): Payer: Medicare Other | Admitting: Pharmacist

## 2023-04-10 ENCOUNTER — Encounter: Payer: Self-pay | Admitting: Pharmacist

## 2023-04-10 VITALS — BP 139/70 | HR 72 | Wt 187.6 lb

## 2023-04-10 DIAGNOSIS — E1165 Type 2 diabetes mellitus with hyperglycemia: Secondary | ICD-10-CM

## 2023-04-10 LAB — POCT GLYCOSYLATED HEMOGLOBIN (HGB A1C): HbA1c, POC (controlled diabetic range): 8.3 % — AB (ref 0.0–7.0)

## 2023-04-10 NOTE — Progress Notes (Signed)
Reviewed and agree with Dr Koval's plan.   

## 2023-04-10 NOTE — Assessment & Plan Note (Signed)
LIBERATE Study:  -Patient provided verbal consent to participate in the study. Consent documented in electronic medical record.  -Provided education on Libre 3 CGM. Collaborated to ensure Josephine Igo 3 app was downloaded on patient's phone. Educated on how to place sensor every 14 days, patient placed first sensor correctly and verbalized understanding of use, removal, and how to place next sensor. Discussed alarms. 8 sensors provided for a 3 month supply. Educated to contact the office if the sensor falls off early and replacements are needed before their next [employer].  Diabetes since 2010 an currently managed with three drug regimen. Medication adherence appears fair - reports not taking diabetes medications therapy for 3 days related to a belief that they may be causing a persistent headache lasting a few days.  -Continued SGLT2-I Jardianc (empagliflozin) at 10mg . Counseled on sick day rules. -Continued metformin 1000mg  BID.  Continued glipizide 10mg  XL daily. -Extensively discussed pathophysiology of diabetes, recommended lifestyle interventions, dietary effects on blood sugar control.

## 2023-04-10 NOTE — Progress Notes (Signed)
S:     Chief Complaint  Patient presents with   Medication Management    Diabetes and Hypertension - liberate enrollment   62 y.o. male who presents for diabetes evaluation, education, and management in the context of the LIBERATE Study.   PMH is significant for diabetes and hypertension.  Patient was referred and last seen by Primary Care Provider, Dr. Jena Gauss, on 04/02/2023.   At last visit, patient had elevated blood sugar of 222mg /dl while in office.  Today, patient arrives in good spirits and presents without any assistance.     O:   Review of Systems  Neurological:  Positive for headaches.    Physical Exam Constitutional:      Appearance: Normal appearance.  Pulmonary:     Effort: Pulmonary effort is normal.  Neurological:     Mental Status: He is alert.  Psychiatric:        Mood and Affect: Mood normal.        Behavior: Behavior normal.        Thought Content: Thought content normal.     Lab Results  Component Value Date   HGBA1C 8.3 (A) 04/10/2023     Vitals:   04/10/23 1059 04/10/23 1100  BP: (!) 144/70 139/70  Pulse: 72   SpO2: 99%      Lipid Panel     Component Value Date/Time   CHOL 272 (H) 07/09/2022 1041   TRIG 389 (H) 07/09/2022 1041   HDL 47 07/09/2022 1041   CHOLHDL 5.8 (H) 07/09/2022 1041   CHOLHDL 7.5 (H) 04/02/2016 1229   VLDL NOT CALC 04/02/2016 1229   LDLCALC 152 (H) 07/09/2022 1041   LDLDIRECT 138 (H) 11/21/2011 0957    Clinical Atherosclerotic Cardiovascular Disease (ASCVD): No  The 10-year ASCVD risk score (Arnett DK, et al., 2019) is: 27.7%   Values used to calculate the score:     Age: 73 years     Sex: Male     Is Non-Hispanic African American: No     Diabetic: Yes     Tobacco smoker: No     Systolic Blood Pressure: 139 mmHg     Is BP treated: No     HDL Cholesterol: 47 mg/dL     Total Cholesterol: 272 mg/dL   Patient is participating in a Managed Medicaid Plan:  Dual eligible    A/P:  LIBERATE Study:   -Patient provided verbal consent to participate in the study. Consent documented in electronic medical record.  -Provided education on Libre 3 CGM. Collaborated to ensure Josephine Igo 3 app was downloaded on patient's phone. Educated on how to place sensor every 14 days, patient placed first sensor correctly and verbalized understanding of use, removal, and how to place next sensor. Discussed alarms. 8 sensors provided for a 3 month supply. Educated to contact the office if the sensor falls off early and replacements are needed before their next [employer].  Diabetes since 2010 an currently managed with three drug regimen. Medication adherence appears fair - reports not taking diabetes medications therapy for 3 days related to a belief that they may be causing a persistent headache lasting a few days.  -Continued SGLT2-I Jardianc (empagliflozin) at 10mg . Counseled on sick day rules. -Continued metformin 1000mg  BID.  Continued glipizide 10mg  XL daily. -Extensively discussed pathophysiology of diabetes, recommended lifestyle interventions, dietary effects on blood sugar control.   Written patient instructions provided. Patient verbalized understanding of treatment plan.  Total time in face to face counseling 48  minutes.    Follow-up:  Pharmacist 3weeks for CGM review. Patient seen with Haze Boyden PharmD Candidate and Bing Plume, PharmD Candidate.

## 2023-04-10 NOTE — Patient Instructions (Addendum)
Un placer conocerte hoy. El estudio LIBERATE te proporcionar sensores para Chief Operating Officer tus niveles de glucosa durante los prximos 6 meses. Le pediremos que vuelva a reunirse con nosotros en las visitas durante los prximos 6 meses para trabajar en el ajuste de los medicamentos para mejorar su control del azcar.  Contine con todos sus medicamentos para la diabetes en las mismas dosis en este momento. Espero verte en la prxima.    Aplicacin de sensores Si [place] la aplicacin, puede tocar [employer] men principal para acceder a un tutorial en la aplicacin sobre cmo aplicar un sensor. Consulte a continuacin las instrucciones sobre cmo Chartered loss adjuster. 1. Aplique los sensores solo en la parte posterior de la parte superior del brazo. Si se coloca en otras reas, es posible que el sensor no funcione correctamente y proporcione lecturas inexactas. Evite reas con cicatrices, lunares, estras o bultos.   2. Seleccione un rea de piel que generalmente permanezca plana durante sus actividades diarias normales (sin doblarse ni doblarse). Elija un lugar que est al menos a 2,5 cm (1 pulgada) de distancia de los lugares de inyeccin. Para evitar molestias o irritacin de la piel, debe seleccionar un sitio diferente al utilizado ms recientemente. 3. Ricci Barker lugar de aplicacin con un jabn comn, squelo y luego lmpielo con una toallita con alcohol. Esto ayudar a eliminar cualquier residuo aceitoso que pueda impedir que el sensor se Careers information officer. Deje que el sitio se seque al aire antes de continuar. Nota: El rea DEBE estar limpia y seca, o es posible que el sensor no permanezca encendido durante todo el tiempo de uso especificado en el inserto del sensor. 4. Desenrosque la tapa del aplicador de sensor y djela a un lado. 5. Coloque el aplicador del sensor sobre el sitio preparado y presione firmemente hacia abajo para aplicar el sensor a su cuerpo. 6. Retire suavemente el aplicador  del sensor de su cuerpo. El sensor ahora debera estar adherido a su piel. 7. Asegrese de que el sensor est seguro despus de la aplicacin. Vuelva a colocar la tapa en el aplicador del sensor. Deseche el aplicador de sensor usado de acuerdo con las regulaciones locales     [place] to meet you today.  The LIBERATE study will provide you with sensors for monitoring your glucose levels for the next 6 months.  We will ask you to return to meet with [place] at visits over the next 6 months to work on adjusting medications to improve your sugar control.   Continue all of your diabetes medications at the same doses at this time.  I look forward to seeing you at the next.     Sensor Application If using the App, you can tap Help in the Main Menu to access an in-app tutorial on applying a Sensor. See below for instructions on how to download the app. Apply Sensors only on the back of your upper arm. If placed in other areas, the Sensor may not function properly and could give you inaccurate readings. Avoid areas with scars, moles, stretch marks, or lumps.   Select an area of skin that generally stays flat during your normal daily activities (no bending or folding). Choose a site that is at least 1 inch (2.5 cm) away from any injection sites. To prevent discomfort or skin irritation, you should select a different site other than the one most recently used. Wash application site using a plain soap, dry, and then clean with an alcohol wipe. This will  help remove any oily residue that may prevent the sensor from sticking properly. Allow site to air dry before proceeding. Note: The area MUST be clean and dry, or the Sensor may not stay on for the full wear duration specified by your Sensor insert. 4. Unscrew the cap from the Sensor Applicator and set the cap aside.  5. Place the Sensor Applicator over the prepared site and push down firmly to apply the Sensor to your body. 6. Gently pull the Sensor Applicator  away from your body. The Sensor should now be attached to your skin. 7. Make sure the Sensor is secure after application. Put the cap back on the Sensor Applicator. Discard the used Engineer, agricultural according to local regulations.  What If My Sensor Falls Off or What If My Sensor Isn't Working? Call [employer] Customer Care Team at [phone] Available 7 days a week from 8AM-8PM EST, excluding holidays If yo have multiple sensors fall off prior to 14 days of use, contact [facility] at [phone]   The App Download the FreeStyle [place] 3 App in your phone's app store   Load the app and select get started now Create an account  Tap scan new sensor Follow the prompts on the screen. If your sensor does not sync, try moving your phone slowly around the sensor. Phone cases may affect scanning. This will be the only time you have to scan the sensor until you apply a new sensor in 14 days.  There will be a 60 minute start up period until the app will display your glucose reading   How To Share Your Readings With [place] Once in the app, go to settings -> connected apps -> LibreView -> Enter Practice ID -> [id number]

## 2023-04-11 ENCOUNTER — Other Ambulatory Visit: Payer: Self-pay | Admitting: Family Medicine

## 2023-04-11 DIAGNOSIS — E1165 Type 2 diabetes mellitus with hyperglycemia: Secondary | ICD-10-CM

## 2023-04-11 NOTE — Progress Notes (Unsigned)
[facility] OFFICE PROGRESS NOTE  Blake Leyden, DO [address] [place] [address]  DIAGNOSIS: f/u of colon cancer   Oncology History Overview Note  Cancer Staging Cancer of right colon [facility]) Staging form: Colon and Rectum, AJCC 8th Edition - Pathologic stage from 10/21/2018: Stage IVC (pT4b, pN1a, pM1c) - Signed by Blake Mood, MD on 11/13/2018 Total positive nodes: 1 Histologic grading system: 4 grade system Histologic grade (G): G2 Residual tumor (R): R0 - None Sites of metastasis: Peritoneum Lymph-vascular invasion (LVI): LVI present/identified, NOS  Follicular lymphoma (HCC), History of Staging form: Lymphoid Neoplasms, AJCC 6th Edition - Clinical: Stage II - Signed by Blake Gaul, MD on 02/01/2014     Follicular lymphoma [facility]), History of  11/2009 Initial Diagnosis   Follicular lymphoma (HCC), History of    Chemotherapy   1) Status post 6 cycles of systemic chemotherapy with CHOP/Rituxan last dose given 05/01/2009.  2) Maintenance Rituxan at 375 mg per meter square given every 2 months status post 12 cycles    Cancer of right colon (HCC)  10/21/2018 Surgery   Exploratory laparotomy right hemicolectomy by Dr. Cliffton Asters and Dr. Magnus Ivan  10/21/18   10/21/2018 Pathology Results   Diagnosis 10/21/18 Colon, segmental resection for tumor, right ascending and appendix - ADENOCARCINOMA, MODERATE TO POORLY DIFFERENTIATED (4 CM) - METASTATIC CARCINOMA INVOLVING ONE OF EIGHTEEN LYMPH NODES (1/18) - CARCINOMA EXTENDS INTO THE APPENDIX - TWO TUMOR DEPOSITS PRESENT - SEE ONCOLOGY TABLE AND COMMENT BELOW   10/21/2018 Cancer Staging   Staging form: Colon and Rectum, AJCC 8th Edition - Pathologic stage from 10/21/2018: Stage IVC (pT4b, pN1a, pM1c) - Signed by Blake Mood, MD on 11/13/2018   11/04/2018 Imaging   CT AP W Contrast 11/04/18  IMPRESSION: 1. Interval appendectomy. There is residual soft tissue thickening and stranding in the right lower quadrant  adjacent to the cecum which may represent ongoing inflammation versus postsurgical changes. A small soft tissue density adjacent to the surgical sutures may reflect small hematoma or operative collection. No large focal fluid collection to suggest drainable abscess allowing for absence of contrast 2. Fluid-filled colon without wall thickening, could reflect diarrheal process   11/08/2018 Imaging   CT Chest W Contrast 11/08/18  IMPRESSION: 1. No acute findings are noted in the thorax to account for the patient's symptoms. 2. Aortic atherosclerosis, in addition to left main and 3 vessel coronary artery disease. Please note that although the presence of coronary artery calcium documents the presence of coronary artery disease, the severity of this disease and any potential stenosis cannot be assessed on this non-gated CT examination. Assessment for potential risk factor modification, dietary therapy or pharmacologic therapy may be warranted, if clinically indicated. 3. Additional incidental findings, as above. Aortic Atherosclerosis (ICD10-I70.0).   11/12/2018 Initial Diagnosis   Cancer of right colon (HCC)   11/25/2018 - 05/05/2019 Chemotherapy   FOLFOX q2weeks starting 11/25/18. Due to moderate thrombocytopenia, I will stop 5-FU bolus, and reduce pump infusion to 2200mg /m2 starting with cycle 7.  Plan for last treatment on 05/05/19.    02/10/2019 Imaging   CT AP W Contrast   IMPRESSION: 1. No evidence metastatic disease. 2. Hepatic steatosis. 3. Mildly enlarged prostate. 4. Aortic atherosclerosis (ICD10-170.0). coronary artery calcification.   09/02/2019 Imaging   CT AP W Contrast IMPRESSION: 1. No signs to suggest metastatic disease in the abdomen or pelvis. 2. Aortic atherosclerosis these, as well as right coronary artery disease.   03/01/2020 Imaging   CT CAP W  Contrast  IMPRESSION: 1. Stable exam. No new or progressive findings to suggest recurrent or metastatic  disease. 2. Status post right hemicolectomy. 3. Aortic Atherosclerosis (ICD10-I70.0).   09/07/2020 Imaging   CT AP  IMPRESSION: 1. Status post right hemicolectomy, without recurrent or metastatic disease. 2. Coronary artery atherosclerosis. Aortic Atherosclerosis (ICD10-I70.0).   09/24/2021 Imaging   CT CAP  IMPRESSION: 1. Status post partial right hemicolectomy and ileocolic anastomosis. 2. No evidence of recurrent or metastatic disease in the chest, abdomen, or pelvis. 3. Mild, diffuse bilateral bronchial wall thickening, consistent with nonspecific infectious or inflammatory bronchitis. 4. Hepatic steatosis. 5. Prostatomegaly. Thickening of the decompressed urinary bladder, likely secondary to chronic outlet obstruction. 6. Coronary artery disease.   Aortic Atherosclerosis (ICD10-I70.0).     CURRENT THERAPY: Surveillance   INTERVAL HISTORY: Blake Vaughn 62 y.o. male returns to clinic today for 30-month follow-up visit.  The patient was last seen on 10/15/2022 by Dr. Mosetta Putt.  The patient is currently on surveillance for his history of colorectal cancer which was diagnosed in 2019.  He is status post with 2 small peritoneal metastases during surgery which were removed, and 12 cycles of FOLFOX.  He has been on observation and feeling well.  His last colonoscopy was by Dr. Jeannetta Ellis in May 2022 and it was recommended that he have a repeat in 3 years..  He also has a history of follicular lymphoma status postchemotherapy in 2010 followed by maintenance Rituxan in 2012.    When he was last seen by Dr. Mosetta Putt, he had a slight increase in his CEA which was nonspecific given his negative CT scan at that time however, she recommended  a follow-up CEA in 2 months was recommended although I do not see that he had lab work performed.  Since last being seen he denies any major changes in his health.  Today he denies any fever, chills, night sweats, or unexplained weight loss.  Denies any  odynophagia or dysphagia.  Denies any lymphadenopathy.  Denies any nausea, vomiting, diarrhea, or constipation.  Denies any.  Denies any chest pain, shortness of breath, cough, or hemoptysis.  Denies any jaundice or itching.  He denies any recent signs or symptoms of infection.  He denies any abnormal bleeding or bruising.  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 He is here today for evaluation repeat blood work.   MEDICAL HISTORY: 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   Qualifier: Diagnosis of  By: Arlyce Dice MD, Barbette Hair    GI bleed 11/05/2018   Hypercholesterolemia    Hypertension 11/27/2011   Lymphoma (HCC)    gets annual chemo last tx Feb 2013   TOBACCO USE, QUIT 08/18/2009   Qualifier: Diagnosis of  By: Lanier Prude  MD, Cathrine Muster      ALLERGIES:  is allergic to iohexol.  MEDICATIONS:  Current Outpatient Medications  Medication Sig Dispense Refill   Accu-Chek Softclix Lancets lancets Use lancets to check sugars 2-3 times per day 100 each 12   atorvastatin (LIPITOR) 20 MG tablet Take 1 tablet (20 mg total) by mouth daily. (Patient not taking: Reported on 04/10/2023) 90 tablet 3   baclofen 5 MG TABS Take 1 tablet (5 mg total) by mouth at bedtime as needed for muscle spasms. (Patient not taking: Reported on 04/10/2023) 7 tablet 0   Blood Glucose Monitoring Suppl (ACCU-CHEK GUIDE) w/Device KIT 1 kit by  Does not apply route daily. 1 kit 0   empagliflozin (JARDIANCE) 10 MG TABS tablet Take 1 tablet (10 mg total) by mouth daily. 30 tablet 1   gabapentin (NEURONTIN) 100 MG capsule Take 1 capsule (100 mg total) by mouth at bedtime. (Patient not taking: Reported on 04/10/2023) 30 capsule 1   glipiZIDE (GLUCOTROL XL) 10 MG 24 hr tablet Take 1 tablet (10 mg total) by mouth daily with breakfast. 90 tablet 0   glucose blood (ACCU-CHEK GUIDE) test strip Use strips to check blood  sugar 2-3 times per day 100 each 12   metFORMIN (GLUCOPHAGE) 1000 MG tablet Take 1 tablet (1,000 mg total) by mouth 2 (two) times daily with a meal. 180 tablet 3   pantoprazole (PROTONIX) 40 MG tablet Take 1 tablet (40 mg total) by mouth daily. 30 tablet 0   No current facility-administered medications for this visit.    SURGICAL HISTORY:  Past Surgical History:  Procedure Laterality Date   ESOPHAGOGASTRODUODENOSCOPY (EGD) WITH PROPOFOL N/A 11/05/2018   Procedure: ESOPHAGOGASTRODUODENOSCOPY (EGD) WITH PROPOFOL;  Surgeon: Bernette Redbird, MD;  Location[facility] ENDOSCOPY;  Service: Endoscopy;  Laterality: N/A;   IR IMAGING GUIDED PORT INSERTION  11/24/2018   IR REMOVAL TUN ACCESS W/ PORT W/O FL MOD SED  11/11/2021   LAPAROSCOPIC APPENDECTOMY N/A 10/21/2018   Procedure: Exploratory laparotomy right hemicolectomy;  Surgeon: Andria Meuse, MD;  Location: [facility] OR;  Service: General;  Laterality: N/A;    REVIEW OF SYSTEMS:   Review of Systems  Constitutional: Negative for appetite change, chills, fatigue, fever and unexpected weight change.  HENT:   Negative for mouth sores, nosebleeds, sore throat and trouble swallowing.   Eyes: Negative for eye problems and icterus.  Respiratory: Negative for cough, hemoptysis, shortness of breath and wheezing.   Cardiovascular: Negative for chest pain and leg swelling.  Gastrointestinal: Negative for abdominal pain, constipation, diarrhea, nausea and vomiting.  Genitourinary: Negative for bladder incontinence, difficulty urinating, dysuria, frequency and hematuria.   Musculoskeletal: Negative for back pain, gait problem, neck pain and neck stiffness.  Skin: Negative for itching and rash.  Neurological: Negative for dizziness, extremity weakness, gait problem, headaches, light-headedness and seizures.  Hematological: Negative for adenopathy. Does not bruise/bleed easily.  Psychiatric/Behavioral: Negative for confusion, depression and sleep disturbance. The  patient is not nervous/anxious.     PHYSICAL EXAMINATION:  There were no vitals taken for this visit.  ECOG PERFORMANCE STATUS: {CHL ONC ECOG [id number]  Physical Exam  Constitutional: Oriented to person, place, and time and well-developed, well-nourished, and in no distress. No distress.  HENT:  Head: Normocephalic and atraumatic.  Mouth/Throat: Oropharynx is clear and moist. No oropharyngeal exudate.  Eyes: Conjunctivae are normal. Right eye exhibits no discharge. Left eye exhibits no discharge. No scleral icterus.  Neck: Normal range of motion. Neck supple.  Cardiovascular: Normal rate, regular rhythm, normal heart sounds and intact distal pulses.   Pulmonary/Chest: Effort normal and breath sounds normal. No respiratory distress. No wheezes. No rales.  Abdominal: Soft. Bowel sounds are normal. Exhibits no distension and no mass. There is no tenderness.  Musculoskeletal: Normal range of motion. Exhibits no edema.  Lymphadenopathy:    No cervical adenopathy.  Neurological: Alert and oriented to person, place, and time. Exhibits normal muscle tone. Gait normal. Coordination normal.  Skin: Skin is warm and dry. No rash noted. Not diaphoretic. No erythema. No pallor.  Psychiatric: Vaughn, memory and judgment normal.  Vitals reviewed.  LABORATORY DATA: Lab Results  Component Value Date  WBC 9.8 10/15/2022   HGB 15.0 10/15/2022   HCT 41.4 10/15/2022   MCV 85.5 10/15/2022   PLT 186 10/15/2022      Chemistry      Component Value Date/Time   NA 129 (L) 10/15/2022 1118   NA 140 01/20/2022 0923   NA 133 (L) 09/09/2016 1338   K 4.6 10/15/2022 1118   K 5.1 09/09/2016 1338   CL 97 (L) 10/15/2022 1118   CL 102 01/26/2013 0805   CO2 22 10/15/2022 1118   CO2 20 (L) 09/09/2016 1338   BUN 26 (H) 10/15/2022 1118   BUN 20 01/20/2022 0923   BUN 18.9 09/09/2016 1338   CREATININE 1.24 10/15/2022 1118   CREATININE 1.0 09/09/2016 1338   GLU 206 (H) 02/06/2009 0836      Component  Value Date/Time   CALCIUM 9.2 10/15/2022 1118   CALCIUM 9.8 09/09/2016 1338   ALKPHOS 76 10/15/2022 1118   ALKPHOS 83 09/09/2016 1338   AST 13 (L) 10/15/2022 1118   AST 14 09/09/2016 1338   ALT 15 10/15/2022 1118   ALT 22 09/09/2016 1338   BILITOT 0.6 10/15/2022 1118   BILITOT 0.40 09/09/2016 1338       RADIOGRAPHIC STUDIES:  No results found.   ASSESSMENT/PLAN:  Blake Vaughn is a 62 y.o. male with    1. Right Cecal adenocarcinoma, pT4bN1aM1c, Stage IVC, with limited peritoneal metastasis, resected, Grade II-III, MMR normal  -Diagnosed in 10/2018. S/p right hemicolectomy. He was found to have 2 small peritoneal metastasis during the surgery, which were removed.   -He completed 12 cycles of adjuvant FOLFOX. -Last endo/colonoscopy on 05/03/21 under Dr. Matthias Hughs at [place] showed a tubular adenoma, otherwise negative. Repeat colonoscopy recommended in 3 years, repeat EGD not necessary. -CT AP on 10/03/22 showed NED. His tumor marker was slightly elevated. Dr. Mosetta Putt recommended recheck in 2 months.  -Labs were reviewed. He is clinically doing well. His labs today show ***. His CEA is pending. Will monitor for results. If elevated, will discuss with Dr. Mosetta Putt -Scans***   2. H/o Follicular Lymphoma  -Was treated with CHOP/Rituxan in 2010. Then he was previously treated with Maintenance Rituxan completed in 2012. Last f/u was in 2017.   3. DM and Hypercholesterolemia, uncontrolled hyperglycemia   -On metformin, Glimepiride, Lipitor and metoprolol.  -His CT AP from 09/02/19 also shows aortic atherosclerosis, which can increase his risk for MI.  -He does use Prednisone before CT scans. He is also to hold metformin 2 days before scan. I advised him to closely watch his BG at home.  -His blood sugar is *** today   5. 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    I spent {CHL ONC TIME VISIT - [id number]} counseling the patient face to face. The total time spent in the appointment was {CHL ONC TIME VISIT - BJYNW:[id number]}.  Oluwadarasimi Redmon L Abdel Effinger, [name] 04/11/23

## 2023-04-13 ENCOUNTER — Encounter: Payer: Self-pay | Admitting: Student

## 2023-04-13 NOTE — Progress Notes (Unsigned)
Patient Care Team: [name], DO as PCP - General (Family Medicine) Andria Meuse, MD as Consulting Physician (Colon and Rectal Surgery) Malachy Mood, MD as Consulting Physician (Hematology)   CHIEF COMPLAINT: Follow up h/o colon cancer and remote h/o follicular lymphoma   Oncology History Overview Note  Cancer Staging Cancer of right colon [facility]) Staging form: Colon and Rectum, AJCC 8th Edition - Pathologic stage from 10/21/2018: Stage IVC (pT4b, pN1a, pM1c) - Signed by Malachy Mood, MD on 11/13/2018 Total positive nodes: 1 Histologic grading system: 4 grade system Histologic grade (G): G2 Residual tumor (R): R0 - None Sites of metastasis: Peritoneum Lymph-vascular invasion (LVI): LVI present/identified, NOS  Follicular lymphoma (HCC), History of Staging form: Lymphoid Neoplasms, AJCC 6th Edition - Clinical: Stage II - Signed by Si Gaul, MD on 02/01/2014     Follicular lymphoma [facility]), History of  11/2009 Initial Diagnosis   Follicular lymphoma (HCC), History of    Chemotherapy   1) Status post 6 cycles of systemic chemotherapy with CHOP/Rituxan last dose given 05/01/2009.  2) Maintenance Rituxan at 375 mg per meter square given every 2 months status post 12 cycles    Cancer of right colon (HCC)  10/21/2018 Surgery   Exploratory laparotomy right hemicolectomy by Dr. Cliffton Asters and Dr. Magnus Ivan  10/21/18   10/21/2018 Pathology Results   Diagnosis 10/21/18 Colon, segmental resection for tumor, right ascending and appendix - ADENOCARCINOMA, MODERATE TO POORLY DIFFERENTIATED (4 CM) - METASTATIC CARCINOMA INVOLVING ONE OF EIGHTEEN LYMPH NODES (1/18) - CARCINOMA EXTENDS INTO THE APPENDIX - TWO TUMOR DEPOSITS PRESENT - SEE ONCOLOGY TABLE AND COMMENT BELOW   10/21/2018 Cancer Staging   Staging form: Colon and Rectum, AJCC 8th Edition - Pathologic stage from 10/21/2018: Stage IVC (pT4b, pN1a, pM1c) - Signed by Malachy Mood, MD on 11/13/2018   11/04/2018 Imaging    CT AP W Contrast 11/04/18  IMPRESSION: 1. Interval appendectomy. There is residual soft tissue thickening and stranding in the right lower quadrant adjacent to the cecum which may represent ongoing inflammation versus postsurgical changes. A small soft tissue density adjacent to the surgical sutures may reflect small hematoma or operative collection. No large focal fluid collection to suggest drainable abscess allowing for absence of contrast 2. Fluid-filled colon without wall thickening, could reflect diarrheal process   11/08/2018 Imaging   CT Chest W Contrast 11/08/18  IMPRESSION: 1. No acute findings are noted in the thorax to account for the patient's symptoms. 2. Aortic atherosclerosis, in addition to left main and 3 vessel coronary artery disease. Please note that although the presence of coronary artery calcium documents the presence of coronary artery disease, the severity of this disease and any potential stenosis cannot be assessed on this non-gated CT examination. Assessment for potential risk factor modification, dietary therapy or pharmacologic therapy may be warranted, if clinically indicated. 3. Additional incidental findings, as above. Aortic Atherosclerosis (ICD10-I70.0).   11/12/2018 Initial Diagnosis   Cancer of right colon (HCC)   11/25/2018 - 05/05/2019 Chemotherapy   FOLFOX q2weeks starting 11/25/18. Due to moderate thrombocytopenia, I will stop 5-FU bolus, and reduce pump infusion to 2200mg /m2 starting with cycle 7.  Plan for last treatment on 05/05/19.    02/10/2019 Imaging   CT AP W Contrast   IMPRESSION: 1. No evidence metastatic disease. 2. Hepatic steatosis. 3. Mildly enlarged prostate. 4. Aortic atherosclerosis (ICD10-170.0). coronary artery calcification.   09/02/2019 Imaging   CT AP W Contrast IMPRESSION: 1. No signs to suggest metastatic disease in the abdomen  or pelvis. 2. Aortic atherosclerosis these, as well as right coronary  artery disease.   03/01/2020 Imaging   CT CAP W Contrast  IMPRESSION: 1. Stable exam. No new or progressive findings to suggest recurrent or metastatic disease. 2. Status post right hemicolectomy. 3. Aortic Atherosclerosis (ICD10-I70.0).   09/07/2020 Imaging   CT AP  IMPRESSION: 1. Status post right hemicolectomy, without recurrent or metastatic disease. 2. Coronary artery atherosclerosis. Aortic Atherosclerosis (ICD10-I70.0).   09/24/2021 Imaging   CT CAP  IMPRESSION: 1. Status post partial right hemicolectomy and ileocolic anastomosis. 2. No evidence of recurrent or metastatic disease in the chest, abdomen, or pelvis. 3. Mild, diffuse bilateral bronchial wall thickening, consistent with nonspecific infectious or inflammatory bronchitis. 4. Hepatic steatosis. 5. Prostatomegaly. Thickening of the decompressed urinary bladder, likely secondary to chronic outlet obstruction. 6. Coronary artery disease.   Aortic Atherosclerosis (ICD10-I70.0).      CURRENT THERAPY: Surveillance   INTERVAL HISTORY Blake Vaughn returns for follow up as scheduled. Last seen by me 06/04/22 and Dr. Mosetta Putt 10/15/2022.  For 3 months he has generalized epigastric and middle abdominal pain that comes and goes for 10 minutes each time over period of 3 days, then will go away but comes back.  Water helps it "feel fresh."  Not associated with eating or bowel movements.  Bowels have been normal, denies bleeding.  He was prescribed Protonix but not sure if he is taking it.  Does not take NSAIDs.  He has been dealing with headaches for "quite a while," PCP thinks related to blood sugar.  He has had headaches in the past which are similar but not always exactly like this.  Headaches will last 4 to 5 days at a time.  These are concerning to him.  He still has neuropathy in his fingers/toes and cramping in his toes, was on gabapentin which ran out and he never refilled, he does not think it helped much.  He is losing weight  intentionally by eating less and walking.  Denies fever or night sweats.  ROS  All other systems reviewed and negative  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   Qualifier: Diagnosis of  By: Arlyce Dice MD, Barbette Hair    GI bleed 11/05/2018   Hypercholesterolemia    Hypertension 11/27/2011   Lymphoma (HCC)    gets annual chemo last tx Feb 2013   TOBACCO USE, QUIT 08/18/2009   Qualifier: Diagnosis of  By: Lanier Prude  MD, Cathrine Muster       Past Surgical History:  Procedure Laterality Date   ESOPHAGOGASTRODUODENOSCOPY (EGD) WITH PROPOFOL N/A 11/05/2018   Procedure: ESOPHAGOGASTRODUODENOSCOPY (EGD) WITH PROPOFOL;  Surgeon: Bernette Redbird, MD;  Location: [facility] ENDOSCOPY;  Service: Endoscopy;  Laterality: N/A;   IR IMAGING GUIDED PORT INSERTION  11/24/2018   IR REMOVAL TUN ACCESS W/ PORT W/O FL MOD SED  11/11/2021   LAPAROSCOPIC APPENDECTOMY N/A 10/21/2018   Procedure: Exploratory laparotomy right hemicolectomy;  Surgeon: Andria Meuse, MD;  Location: [facility] OR;  Service: General;  Laterality: N/A;     Outpatient Encounter Medications as of 04/14/2023  Medication Sig Note   Accu-Chek Softclix Lancets lancets Use lancets to check sugars 2-3 times per day    empagliflozin (JARDIANCE) 10 MG TABS tablet Take 1 tablet (10 mg total) by mouth daily.    glipiZIDE (GLUCOTROL XL) 10 MG 24 hr tablet Take 1 tablet by mouth once daily with breakfast  metFORMIN (GLUCOPHAGE) 1000 MG tablet Take 1 tablet (1,000 mg total) by mouth 2 (two) times daily with a meal.    atorvastatin (LIPITOR) 20 MG tablet Take 1 tablet (20 mg total) by mouth daily. (Patient not taking: Reported on 04/10/2023) 03/14/2023: PT NOT TAKING   baclofen 5 MG TABS Take 1 tablet (5 mg total) by mouth at bedtime as needed for muscle spasms. (Patient not taking: Reported on 04/10/2023)    Blood Glucose Monitoring Suppl (ACCU-CHEK GUIDE) w/Device KIT 1 kit by  Does not apply route daily. (Patient not taking: Reported on 04/14/2023) 03/14/2023: Pt states he has not been checking   gabapentin (NEURONTIN) 100 MG capsule Take 1 capsule (100 mg total) by mouth at bedtime. (Patient not taking: Reported on 04/10/2023) 03/14/2023: NOT TAKING   glucose blood (ACCU-CHEK GUIDE) test strip Use strips to check blood sugar 2-3 times per day (Patient not taking: Reported on 04/14/2023)    pantoprazole (PROTONIX) 40 MG tablet Take 1 tablet (40 mg total) by mouth daily.    [DISCONTINUED] glipiZIDE (GLUCOTROL XL) 10 MG 24 hr tablet Take 1 tablet (10 mg total) by mouth daily with breakfast.    [DISCONTINUED] pantoprazole (PROTONIX) 40 MG tablet Take 1 tablet (40 mg total) by mouth daily. (Patient not taking: Reported on 04/14/2023)    No facility-administered encounter medications on file as of 04/14/2023.     Today's Vitals   04/14/23 0929  BP: 133/74  Pulse: 76  Resp: 17  Temp: 98.6 F (37 C)  TempSrc: Oral  SpO2: 97%  Weight: 185 lb 4.8 oz (84.1 kg)   Body mass index is 33.89 kg/m.   PHYSICAL EXAM GENERAL:alert, no distress and comfortable SKIN: no rash  EYES: sclera clear NECK: without mass LYMPH:  no palpable cervical, supraclavicular, or axillary lymphadenopathy  LUNGS: clear with normal breathing effort HEART: regular rate & rhythm, no lower extremity edema ABDOMEN: abdomen soft, non-tender and normal bowel sounds NEURO: alert & oriented x 3 with fluent speech, no focal motor/sensory deficits.  Mildly decreased vibratory sense over the fingertips per tuning fork exam     CBC    Component Value Date/Time   WBC 6.9 04/14/2023 0857   WBC 8.7 06/04/2022 0904   RBC 5.17 04/14/2023 0857   HGB 15.7 04/14/2023 0857   HGB 14.6 07/09/2022 1041   HGB 15.2 09/09/2016 1338   HCT 44.5 04/14/2023 0857   HCT 43.0 07/09/2022 1041   HCT 42.4 09/09/2016 1338   PLT 179 04/14/2023 0857   PLT 154 07/09/2022 1041   MCV 86.1 04/14/2023 0857   MCV 91 07/09/2022 1041    MCV 85.1 09/09/2016 1338   MCH 30.4 04/14/2023 0857   MCHC 35.3 04/14/2023 0857   RDW 12.3 04/14/2023 0857   RDW 12.3 07/09/2022 1041   RDW 12.6 09/09/2016 1338   LYMPHSABS 1.7 04/14/2023 0857   LYMPHSABS 0.8 (L) 09/09/2016 1338   MONOABS 0.5 04/14/2023 0857   MONOABS 0.1 09/09/2016 1338   EOSABS 0.2 04/14/2023 0857   EOSABS 0.0 09/09/2016 1338   BASOSABS 0.0 04/14/2023 0857   BASOSABS 0.0 09/09/2016 1338     CMP     Component Value Date/Time   NA 133 (L) 04/14/2023 0857   NA 140 01/20/2022 0923   NA 133 (L) 09/09/2016 1338   K 3.9 04/14/2023 0857   K 5.1 09/09/2016 1338   CL 103 04/14/2023 0857   CL 102 01/26/2013 0805   CO2 22 04/14/2023 0857   CO2 20 (L)  09/09/2016 1338   GLUCOSE 240 (H) 04/14/2023 0857   GLUCOSE 293 (H) 09/09/2016 1338   GLUCOSE 268 (H) 01/26/2013 0805   BUN 30 (H) 04/14/2023 0857   BUN 20 01/20/2022 0923   BUN 18.9 09/09/2016 1338   CREATININE 0.99 04/14/2023 0857   CREATININE 1.0 09/09/2016 1338   CALCIUM 9.2 04/14/2023 0857   CALCIUM 9.8 09/09/2016 1338   PROT 6.6 04/14/2023 0857   PROT 5.9 (L) 07/16/2017 1502   PROT 6.7 09/09/2016 1338   ALBUMIN 3.8 04/14/2023 0857   ALBUMIN 4.0 07/16/2017 1502   ALBUMIN 3.6 09/09/2016 1338   AST 18 04/14/2023 0857   AST 14 09/09/2016 1338   ALT 20 04/14/2023 0857   ALT 22 09/09/2016 1338   ALKPHOS 62 04/14/2023 0857   ALKPHOS 83 09/09/2016 1338   BILITOT 1.0 04/14/2023 0857   BILITOT 0.40 09/09/2016 1338   GFRNONAA >60 04/14/2023 0857   GFRNONAA >89 04/02/2016 1229   GFRAA >60 09/07/2020 1413   GFRAA >89 04/02/2016 1229     ASSESSMENT & PLAN:Tarrance B Stepler is a 63 y.o. male with    1. Right Cecal adenocarcinoma, pT4bN1aM1c, Stage IVC, with limited peritoneal metastasis, resected, Grade II-III, MMR normal  -Diagnosed in 10/2018. Treated with right hemicolectomy. He was found to have 2 small peritoneal metastasis during the surgery, which were removed.   -He completed 12 cycles of adjuvant  FOLFOX. Now on surveillance. -CT A/P on 09/07/20 and 09/24/2021, and 10/03/2022 were NED -Surveillance endo/colonoscopy on 05/03/21 under Dr. Matthias Hughs at [place]. Pathology showed a tubular adenoma in the transverse colon, otherwise negative. Repeat colonoscopy recommended in 3 years, repeat endoscopy not necessary. -Mr. Stauss appears stable. He has intermittent epigastric/abdominal pain for 3 months, not related to po/BM. H Pylori negative. PCP prescribed PPI but he doesn't think he's taking. I refilled for him to start -While this could be GERD or any number of benign things, given his h/o cecal cancer with peritoneal deposits at time of surgery, will obtain CT to r/o recurrence. Pt agrees.  -Otherwise doing well. Labs stable. CEA 5.15 in 10/2022, today's level is pending.  -F/up after scan  2.  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  Still functions normally for the most part, difficulty with buttons -Exam with decreased peripheral vibratory sense over the fingertips -Gabapentin was not very helpful and did not refill -I offered to refer to neuro onc Dr. Barbaraann Cao, pt accepted and I referred him  3. 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 Barbaraann Cao to see for this as well   3. H/o Follicular Lymphoma  -Was treated with CHOP/Rituxan in 2010. Then he was previously treated with Maintenance Rituxan completed in 2012. Last f/u was in 2017.   5. DM and Hypercholesterolemia,  uncontrolled hyperglycemia, GERD -On metformin, Glimepiride, Lipitor and metoprolol.  -His CT AP from 09/02/19 also shows aortic atherosclerosis, which can increase his risk for MI.  -He does use Prednisone before CT scans. He is also to hold metformin 2 days  before scan. I advised him to closely watch his BG at home.    PLAN: -Labs reviewed, CEA pending -CT AP in 1-2 weeks with allergy protocol -Pt to start PPI -Referral to neuro onc Dr. Barbaraann Cao for neuropathy and headaches  -F/up after CT  Orders Placed This Encounter  Procedures   CT Abdomen Pelvis W Contrast    Standing Status:   Future    Standing Expiration Date:   04/13/2024    Order Specific Question:   If indicated for the ordered procedure, I authorize the administration of contrast media per Radiology protocol    Answer:   Yes    Order Specific Question:   Does the patient have a contrast media/X-ray dye allergy?    Answer:   Yes    Order Specific Question:   Preferred imaging location?    Answer:   [facility]    Order Specific Question:   If indicated for the ordered procedure, I authorize the administration of oral contrast media per Radiology protocol    Answer:   Yes   Amb Referral to Neuro Oncology    Referral Priority:   Routine    Referral Type:   Consultation    Referral Reason:   Specialty Services Required    Number of Visits Requested:   1      All questions were answered. The patient knows to call the clinic with any problems, questions or concerns. No barriers to learning were detected. I spent 20 minutes counseling the patient face to face. The total time spent in the appointment was 30 minutes and more than 50% was on counseling, review of test results, and coordination of care.   Santiago Glad, NP-C 04/14/2023

## 2023-04-14 ENCOUNTER — Inpatient Hospital Stay (HOSPITAL_BASED_OUTPATIENT_CLINIC_OR_DEPARTMENT_OTHER): Payer: Medicare Other | Admitting: Nurse Practitioner

## 2023-04-14 ENCOUNTER — Encounter: Payer: Self-pay | Admitting: Nurse Practitioner

## 2023-04-14 ENCOUNTER — Other Ambulatory Visit: Payer: Self-pay

## 2023-04-14 ENCOUNTER — Inpatient Hospital Stay: Payer: Medicare Other | Attending: Nurse Practitioner

## 2023-04-14 VITALS — BP 133/74 | HR 76 | Temp 98.6°F | Resp 17 | Wt 185.3 lb

## 2023-04-14 DIAGNOSIS — Z8572 Personal history of non-Hodgkin lymphomas: Secondary | ICD-10-CM | POA: Insufficient documentation

## 2023-04-14 DIAGNOSIS — Z7984 Long term (current) use of oral hypoglycemic drugs: Secondary | ICD-10-CM | POA: Diagnosis not present

## 2023-04-14 DIAGNOSIS — Z8673 Personal history of transient ischemic attack (TIA), and cerebral infarction without residual deficits: Secondary | ICD-10-CM | POA: Diagnosis not present

## 2023-04-14 DIAGNOSIS — Z9221 Personal history of antineoplastic chemotherapy: Secondary | ICD-10-CM | POA: Diagnosis not present

## 2023-04-14 DIAGNOSIS — D500 Iron deficiency anemia secondary to blood loss (chronic): Secondary | ICD-10-CM

## 2023-04-14 DIAGNOSIS — C182 Malignant neoplasm of ascending colon: Secondary | ICD-10-CM

## 2023-04-14 DIAGNOSIS — D696 Thrombocytopenia, unspecified: Secondary | ICD-10-CM | POA: Diagnosis not present

## 2023-04-14 DIAGNOSIS — K219 Gastro-esophageal reflux disease without esophagitis: Secondary | ICD-10-CM

## 2023-04-14 DIAGNOSIS — G629 Polyneuropathy, unspecified: Secondary | ICD-10-CM

## 2023-04-14 DIAGNOSIS — R519 Headache, unspecified: Secondary | ICD-10-CM

## 2023-04-14 DIAGNOSIS — E1140 Type 2 diabetes mellitus with diabetic neuropathy, unspecified: Secondary | ICD-10-CM | POA: Diagnosis not present

## 2023-04-14 DIAGNOSIS — Z79899 Other long term (current) drug therapy: Secondary | ICD-10-CM | POA: Insufficient documentation

## 2023-04-14 LAB — CMP (CANCER CENTER ONLY)
ALT: 20 U/L (ref 0–44)
AST: 18 U/L (ref 15–41)
Albumin: 3.8 g/dL (ref 3.5–5.0)
Alkaline Phosphatase: 62 U/L (ref 38–126)
Anion gap: 8 (ref 5–15)
BUN: 30 mg/dL — ABNORMAL HIGH (ref 8–23)
CO2: 22 mmol/L (ref 22–32)
Calcium: 9.2 mg/dL (ref 8.9–10.3)
Chloride: 103 mmol/L (ref 98–111)
Creatinine: 0.99 mg/dL (ref 0.61–1.24)
GFR, Estimated: >60 mL/min (ref >60)
Glucose, Bld: 240 mg/dL — ABNORMAL HIGH (ref 70–99)
Potassium: 3.9 mmol/L (ref 3.5–5.1)
Sodium: 133 mmol/L — ABNORMAL LOW (ref 135–145)
Total Bilirubin: 1 mg/dL (ref 0.3–1.2)
Total Protein: 6.6 g/dL (ref 6.5–8.1)

## 2023-04-14 LAB — CBC WITH DIFFERENTIAL (CANCER CENTER ONLY)
Abs Immature Granulocytes: 0.02 10*3/uL (ref 0.00–0.07)
Basophils Absolute: 0 10*3/uL (ref 0.0–0.1)
Basophils Relative: 0 %
Eosinophils Absolute: 0.2 10*3/uL (ref 0.0–0.5)
Eosinophils Relative: 3 %
HCT: 44.5 % (ref 39.0–52.0)
Hemoglobin: 15.7 g/dL (ref 13.0–17.0)
Immature Granulocytes: 0 %
Lymphocytes Relative: 24 %
Lymphs Abs: 1.7 10*3/uL (ref 0.7–4.0)
MCH: 30.4 pg (ref 26.0–34.0)
MCHC: 35.3 g/dL (ref 30.0–36.0)
MCV: 86.1 fL (ref 80.0–100.0)
Monocytes Absolute: 0.5 10*3/uL (ref 0.1–1.0)
Monocytes Relative: 7 %
Neutro Abs: 4.5 10*3/uL (ref 1.7–7.7)
Neutrophils Relative %: 66 %
Platelet Count: 179 10*3/uL (ref 150–400)
RBC: 5.17 MIL/uL (ref 4.22–5.81)
RDW: 12.3 % (ref 11.5–15.5)
WBC Count: 6.9 10*3/uL (ref 4.0–10.5)
nRBC: 0 % (ref 0.0–0.2)

## 2023-04-14 LAB — FERRITIN: Ferritin: 124 ng/mL (ref 24–336)

## 2023-04-14 LAB — CEA (ACCESS): CEA (CHCC): 2.5 ng/mL (ref 0.00–5.00)

## 2023-04-14 MED ORDER — PANTOPRAZOLE SODIUM 40 MG PO TBEC: 40.0000 mg | DELAYED_RELEASE_TABLET | Freq: Every day | ORAL | 2 refills | Status: DC

## 2023-04-15 ENCOUNTER — Other Ambulatory Visit: Payer: Self-pay

## 2023-04-15 ENCOUNTER — Other Ambulatory Visit: Payer: Medicare Other

## 2023-04-15 ENCOUNTER — Ambulatory Visit: Payer: Medicare Other | Admitting: Physician Assistant

## 2023-04-15 MED ORDER — PREDNISONE 50 MG PO TABS: ORAL_TABLET | ORAL | 0 refills | Status: DC

## 2023-04-15 MED ORDER — DIPHENHYDRAMINE HCL 50 MG PO TABS: 50.0000 mg | ORAL_TABLET | ORAL | 0 refills | Status: DC | PRN

## 2023-04-16 ENCOUNTER — Other Ambulatory Visit: Payer: Self-pay | Admitting: Nurse Practitioner

## 2023-04-16 MED ORDER — GABAPENTIN 100 MG PO CAPS: 100.0000 mg | ORAL_CAPSULE | Freq: Every day | ORAL | 0 refills | Status: DC

## 2023-04-16 NOTE — Progress Notes (Signed)
Pt's interpreter Raynelle Fanning notified me that patient is requesting refill of gabapentin. He still has neuropathy and cramps are worse. I refilled previous dose of 100 mg qHS. Sent to pharmacy on file. She will update the patient.   Santiago Glad, NP

## 2023-04-20 ENCOUNTER — Ambulatory Visit (HOSPITAL_COMMUNITY)
Admission: RE | Admit: 2023-04-20 | Discharge: 2023-04-20 | Disposition: A | Discharge status: Home / Self Care | Payer: Medicare Other | Source: Ambulatory Visit | Attending: Nurse Practitioner | Admitting: Nurse Practitioner

## 2023-04-20 DIAGNOSIS — C182 Malignant neoplasm of ascending colon: Secondary | ICD-10-CM | POA: Diagnosis not present

## 2023-04-20 DIAGNOSIS — C189 Malignant neoplasm of colon, unspecified: Secondary | ICD-10-CM | POA: Diagnosis not present

## 2023-04-20 MED ORDER — DIPHENHYDRAMINE HCL 25 MG PO CAPS: 50.0000 mg | ORAL_CAPSULE | Freq: Four times a day (QID) | ORAL | Status: DC | PRN

## 2023-04-20 MED ORDER — IOHEXOL 300 MG/ML  SOLN
100.0000 mL | Freq: Once | INTRAMUSCULAR | Status: AC | PRN
  Administered 2023-04-20: 100 mL via INTRAVENOUS

## 2023-04-20 MED ORDER — IOHEXOL 9 MG/ML PO SOLN
ORAL | Status: AC
  Filled 2023-04-20: qty 1000

## 2023-04-20 MED ORDER — SODIUM CHLORIDE (PF) 0.9 % IJ SOLN
INTRAMUSCULAR | Status: AC
  Filled 2023-04-20: qty 50

## 2023-04-20 MED ORDER — DIPHENHYDRAMINE HCL 25 MG PO CAPS
ORAL_CAPSULE | ORAL | Status: AC
  Filled 2023-04-20: qty 2

## 2023-04-20 MED ORDER — IOHEXOL 9 MG/ML PO SOLN: 500.0000 mL | ORAL | Status: AC

## 2023-04-21 NOTE — Assessment & Plan Note (Deleted)
pT4bN1aM1c, Stage IVC, with limited peritoneal metastasis, resected, Grade II-III, MMR normal  -Diagnosed in 10/2018. Treated with right hemicolectomy. He was found to have 2 small peritoneal metastasis during the surgery, which were removed.   -He completed 12 cycles of adjuvant FOLFOX. Now on surveillance. -CT A/P on 09/07/20 and 09/24/2021, and 10/03/2022 were NED -Surveillance endo/colonoscopy on 05/03/21 under Dr. Matthias Hughs at [place]. Pathology showed a tubular adenoma in the transverse colon, otherwise negative. Repeat colonoscopy recommended in 3 years, repeat endoscopy not necessary. -Surveillance CT scan of abdomen pelvis with contrast from Apr 24, 2023 showed no evidence of recurrence.

## 2023-04-22 ENCOUNTER — Inpatient Hospital Stay: Payer: Medicare Other | Admitting: Hematology

## 2023-04-22 DIAGNOSIS — C182 Malignant neoplasm of ascending colon: Secondary | ICD-10-CM

## 2023-04-23 ENCOUNTER — Telehealth: Payer: Self-pay | Admitting: Hematology

## 2023-04-23 ENCOUNTER — Telehealth: Payer: Self-pay | Admitting: Internal Medicine

## 2023-04-23 NOTE — Telephone Encounter (Signed)
Contacted patient to scheduled appointments. Left message with appointment details and a call back number if patient had any questions or could not accommodate the time we provided.   

## 2023-04-23 NOTE — Telephone Encounter (Signed)
Scheduled per 05/15 scheduled message, called and left a voicemail.

## 2023-04-28 ENCOUNTER — Telehealth: Payer: Self-pay

## 2023-04-28 NOTE — Telephone Encounter (Addendum)
Called patient and relayed message below as per Santiago Glad NP. Patient voiced full understanding.   ----- Message from Pollyann Samples, NP sent at 04/23/2023 12:06 PM EDT ----- This patient missed f/up with Dr. Mosetta Putt to review CT. Not sure if you already sent a schedule message to r/s.   If not, Anothony Bursch, please let him know scan is negative, no recurrence/metastatic disease, and no explanation for epigastric pain. I recommend to take PPI and f/up with GI. Chelsea, please schedule next lab and surveillance visit in 6 months.   Thanks, Clayborn Heron NP

## 2023-04-30 ENCOUNTER — Ambulatory Visit (HOSPITAL_COMMUNITY)
Admission: EM | Admit: 2023-04-30 | Discharge: 2023-04-30 | Disposition: A | Discharge status: Home / Self Care | Payer: Medicare Other | Attending: Urgent Care | Admitting: Urgent Care

## 2023-04-30 ENCOUNTER — Encounter (HOSPITAL_COMMUNITY): Payer: Self-pay

## 2023-04-30 DIAGNOSIS — L255 Unspecified contact dermatitis due to plants, except food: Secondary | ICD-10-CM | POA: Diagnosis not present

## 2023-04-30 MED ORDER — PREDNISONE 10 MG (21) PO TBPK: ORAL_TABLET | Freq: Every day | ORAL | 0 refills | Status: DC

## 2023-04-30 MED ORDER — DEXAMETHASONE SODIUM PHOSPHATE 10 MG/ML IJ SOLN
10.0000 mg | Freq: Once | INTRAMUSCULAR | Status: AC
  Administered 2023-04-30: 10 mg via INTRAMUSCULAR

## 2023-04-30 MED ORDER — DEXAMETHASONE SODIUM PHOSPHATE 10 MG/ML IJ SOLN
INTRAMUSCULAR | Status: AC
  Filled 2023-04-30: qty 1

## 2023-04-30 MED ORDER — TRIAMCINOLONE ACETONIDE 0.5 % EX OINT: 1.0000 | TOPICAL_OINTMENT | Freq: Two times a day (BID) | CUTANEOUS | 0 refills | Status: DC

## 2023-04-30 NOTE — ED Triage Notes (Signed)
Pt is here for a rash all over his body x 1 week. Pt is using over the counter itch cream.

## 2023-04-30 NOTE — Discharge Instructions (Addendum)
Your rash is due to poison ivy.  Please avoid itching your rash as this can cause secondary bacterial infections in severe cases. You can start the topical steroid cream, but for no longer than 14 days consecutively. Avoid use on the face. Start taking the prednisone taper as directed. Best to take in the mornings to prevent insomnia. (Start tomorrow 05/01/23 since you received a shot in our office tonight). Avoid alcohol wipes or topical sprays as this is drying and can make it more itchy. Follow up if rash worsens, or systemic symptoms develop including wheezing.   PLEASE MONITOR YOUR BLOOD SUGAR WHILE ON THE STEROIDS! Try to cut back on carbohydrates while taking steroids to prevent blood sugar spikes.

## 2023-04-30 NOTE — ED Provider Notes (Signed)
[facility]    CSN: [id number] Arrival date & time: 04/30/23  1951      History   Chief Complaint Chief Complaint  Patient presents with   Rash    HPI Blake Vaughn is a 62 y.o. male.   Pleasant 62 year old male presents today due to concerns of poison ivy dermatitis.  He states it is all over his trunk and bilateral upper extremities.  He denies any involvement of his groin or legs.  He has a known history of reaction such as this to poison ivy in the past.  He states this has been positive for 1 week.  He has been using oral Benadryl but states this is not helped with the itch or the rash.  He is a diabetic, blood sugar in office is 189 thirty minutes after eating dinner. (Checked with pt's Dexcom)  States he has been on prednisone in the past without any significant glycemic issues. Pt denies any systemic allergic symptoms.   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   Qualifier: Diagnosis of  By: Arlyce Dice MD, Barbette Hair    GI bleed 11/05/2018   Hypercholesterolemia    Hypertension 11/27/2011   Lymphoma (HCC)    gets annual chemo last tx Feb 2013   TOBACCO USE, QUIT 08/18/2009   Qualifier: Diagnosis of  By: Lanier Prude  MD, Taineisha      Patient Active Problem List   Diagnosis Date Noted   Gastroesophageal reflux disease without esophagitis 04/02/2023   Blurred vision 04/30/2021   Microalbuminuria 06/05/2020   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 12/24/2019   Neuropathy 07/30/2019   Port-A-Cath in place 11/25/2018   Cancer of right colon (HCC) 11/12/2018   Syncope due to orthostatic hypotension 11/05/2018   Hypoalbuminemia 11/05/2018   Hypoproteinemia (HCC) 11/05/2018   Uses Spanish as primary spoken language 10/23/2018   Obesity (BMI 30-39.9) 10/23/2018   Mass of cecum s/p right colectomy 10/21/2018 10/22/2018   Nocturnal leg cramps  07/16/2017   HTN (hypertension) 11/27/2011   Chronic nonintractable headache 10/16/2011   Pure hypercholesterolemia 09/24/2009   ERECTILE DYSFUNCTION, 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   Type 2 diabetes mellitus with hyperglycemia (HCC) 07/09/2009   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/09/2009   Follicular lymphoma (HCC), 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    Past Surgical History:  Procedure Laterality Date   ESOPHAGOGASTRODUODENOSCOPY (EGD) WITH PROPOFOL N/A 11/05/2018   Procedure: ESOPHAGOGASTRODUODENOSCOPY (EGD) WITH PROPOFOL;  Surgeon: Bernette Redbird, MD;  Location: [facility] ENDOSCOPY;  Service: Endoscopy;  Laterality: N/A;   IR IMAGING GUIDED PORT INSERTION  11/24/2018   IR REMOVAL TUN ACCESS W/ PORT W/O FL MOD SED  11/11/2021   LAPAROSCOPIC APPENDECTOMY N/A 10/21/2018   Procedure: Exploratory laparotomy right hemicolectomy;  Surgeon: Andria Meuse, MD;  Location: [facility] OR;  Service: General;  Laterality: N/A;       Home Medications    Prior to Admission medications   Medication Sig Start Date End Date Taking? Authorizing Provider  diphenhydrAMINE (BENADRYL) 50 MG tablet Take 1 tablet (50 mg total) by mouth as needed for itching. 1 hour before injection 04/15/23   Pollyann Samples, NP  predniSONE (STERAPRED UNI-PAK 21 TAB) 10 MG (21) TBPK tablet Take by mouth daily. Take 6 tabs by mouth daily  for 1 days, then 5 tabs for 1 days, then 4 tabs for 1 days, then 3 tabs for 1 days, 2 tabs for  1 days, then 1 tab by mouth daily for 1 days 04/30/23  Yes Shenell Rogalski L, PA  triamcinolone ointment (KENALOG) 0.5 % Apply 1 Application topically 2 (two) times daily. 04/30/23  Yes Shukri Nistler L, PA  Accu-Chek Softclix Lancets lancets Use lancets to check sugars 2-3 times per day 12/11/22   Lilland, Alana, DO  atorvastatin (LIPITOR) 20 MG tablet Take 1 tablet (20 mg total) by mouth daily. Patient not taking: Reported on 04/10/2023 07/09/22   Lilland, Alana, DO  baclofen 5 MG TABS Take 1 tablet (5 mg total) by mouth at bedtime as needed for  muscle spasms. Patient not taking: Reported on 04/10/2023 03/14/23   Raspet, Noberto Retort, [name]  Blood Glucose Monitoring Suppl (ACCU-CHEK GUIDE) w/Device KIT 1 kit by Does not apply route daily. Patient not taking: Reported on 04/14/2023 12/05/22   Lilland, Alana, DO  empagliflozin (JARDIANCE) 10 MG TABS tablet Take 1 tablet (10 mg total) by mouth daily. 10/16/22   Littie Deeds, MD  gabapentin (NEURONTIN) 100 MG capsule Take 1 capsule (100 mg total) by mouth at bedtime. 04/16/23   Pollyann Samples, NP  glipiZIDE (GLUCOTROL XL) 10 MG 24 hr tablet Take 1 tablet by mouth once daily with breakfast 04/14/23   Lilland, Alana, DO  glucose blood (ACCU-CHEK GUIDE) test strip Use strips to check blood sugar 2-3 times per day Patient not taking: Reported on 04/14/2023 12/11/22   Lilland, Alana, DO  metFORMIN (GLUCOPHAGE) 1000 MG tablet Take 1 tablet (1,000 mg total) by mouth 2 (two) times daily with a meal. 07/11/22   Lilland, Alana, DO  pantoprazole (PROTONIX) 40 MG tablet Take 1 tablet (40 mg total) by mouth daily. 04/14/23   Pollyann Samples, NP    Family History Family History  Problem Relation Age of Onset   Diabetes Mother    Diabetes Father    Renal Disease Brother    Diabetes Brother    Cancer Brother        lymphoma     Social History Social History   Tobacco Use   Smoking status: Former    Packs/day: 0.50    Years: 30.00    Additional pack years: 0.00    Total pack years: 15.00    Types: Cigarettes    Quit date: 12/08/2004    Years since quitting: 18.4   Smokeless tobacco: Former    Quit date: 11/20/2006  Vaping Use   Vaping Use: Never used  Substance Use Topics   Alcohol use: No   Drug use: No     Allergies   Iohexol   Review of Systems Review of Systems  Skin:  Positive for rash.  As per HPI   Physical Exam Triage Vital Signs ED Triage Vitals  Enc Vitals Group     BP 04/30/23 1957 (!) 148/92     Pulse Rate 04/30/23 1957 81     Resp 04/30/23 1958 18     Temp 04/30/23 1957 98.2 F  (36.8 C)     Temp Source 04/30/23 1957 Oral     SpO2 04/30/23 1957 98 %     Weight --      Height --      Head Circumference --      Peak Flow --      Pain Score --      Pain Loc --      Pain Edu? --      Excl. in GC? --    No data found.  Updated Vital Signs BP (!) 148/92 (BP Location: Left Arm)   Pulse 81   Temp 98.2 F (36.8 C) (Oral)   Resp 18   SpO2 98%   Visual Acuity Right Eye Distance:   Left Eye Distance:   Bilateral Distance:    Right Eye Near:   Left Eye Near:    Bilateral Near:     Physical Exam Vitals and nursing note reviewed.  Constitutional:      General: He is not in acute distress.    Appearance: Normal appearance. He is obese. He is not ill-appearing, toxic-appearing or diaphoretic.  HENT:     Head: Normocephalic and atraumatic.     Nose: Nose normal.     Mouth/Throat:     Mouth: Mucous membranes are moist.     Pharynx: Oropharynx is clear. No oropharyngeal exudate or posterior oropharyngeal erythema.  Eyes:     General: No scleral icterus.       Right eye: No discharge.        Left eye: No discharge.     Extraocular Movements: Extraocular movements intact.     Pupils: Pupils are equal, round, and reactive to light.  Cardiovascular:     Rate and Rhythm: Normal rate.  Pulmonary:     Effort: Pulmonary effort is normal. No respiratory distress.  Musculoskeletal:     Cervical back: Normal range of motion.  Lymphadenopathy:     Cervical: No cervical adenopathy.  Skin:    General: Skin is warm and dry.     Findings: Rash (generalized contact dermatitis rash, most severe to R upper extremity, however generalized across trunk, back and BUE) present.  Neurological:     General: No focal deficit present.     Mental Status: He is alert and oriented to person, place, and time.     Cranial Nerves: No cranial nerve deficit.     Sensory: No sensory deficit.      UC Treatments / Results  Labs (all labs ordered are listed, but only abnormal  results are displayed) Labs Reviewed - No data to display  EKG   Radiology No results found.  Procedures Procedures (including critical care time)  Medications Ordered in UC Medications  dexamethasone (DECADRON) injection 10 mg (10 mg Intramuscular Given 04/30/23 2016)    Initial Impression / Assessment and Plan / UC Course  I have reviewed the triage vital signs and the nursing notes.  Pertinent labs & imaging results that were available during my care of the patient were reviewed by me and considered in my medical decision making (see chart for details).     Dermatitis due to plants -cutaneous reaction to poison ivy.  Patient without any systemic symptoms of allergic reaction.  Due to severity and timeframe of rash, will give Dex Methasone injection here in the office.  Patient understands that steroids have a possible glycemic complication.  He does have a meter in his arm and can check it often.  He has taken prednisone in the past and denies any significant glucose issues.  He will therefore start the 6-day taper tomorrow.  Will also use a short course of topical triamcinolone.   Final Clinical Impressions(s) / UC Diagnoses   Final diagnoses:  Dermatitis due to plants, including poison ivy, sumac, and oak     Discharge Instructions      Your rash is due to poison ivy.  Please avoid itching your rash as this can cause secondary bacterial infections in severe cases. You  can start the topical steroid cream, but for no longer than 14 days consecutively. Avoid use on the face. Start taking the prednisone taper as directed. Best to take in the mornings to prevent insomnia. (Start tomorrow 05/01/23 since you received a shot in our office tonight). Avoid alcohol wipes or topical sprays as this is drying and can make it more itchy. Follow up if rash worsens, or systemic symptoms develop including wheezing.   PLEASE MONITOR YOUR BLOOD SUGAR WHILE ON THE STEROIDS! Try to cut back  on carbohydrates while taking steroids to prevent blood sugar spikes.     ED Prescriptions     Medication Sig Dispense Auth. Provider   triamcinolone ointment (KENALOG) 0.5 % Apply 1 Application topically 2 (two) times daily. 30 g Wray Goehring L, PA   predniSONE (STERAPRED UNI-PAK 21 TAB) 10 MG (21) TBPK tablet Take by mouth daily. Take 6 tabs by mouth daily  for 1 days, then 5 tabs for 1 days, then 4 tabs for 1 days, then 3 tabs for 1 days, 2 tabs for 1 days, then 1 tab by mouth daily for 1 days 21 tablet Elvia Aydin L, PA      PDMP not reviewed this encounter.   Maretta Bees, [place] 04/30/23 2054

## 2023-05-01 ENCOUNTER — Ambulatory Visit: Payer: Medicare Other | Admitting: Hematology

## 2023-05-05 ENCOUNTER — Ambulatory Visit: Payer: Medicare Other | Admitting: Internal Medicine

## 2023-05-06 ENCOUNTER — Ambulatory Visit: Payer: Medicare Other | Admitting: Pharmacist

## 2023-05-12 ENCOUNTER — Inpatient Hospital Stay: Payer: Medicare Other | Admitting: Internal Medicine

## 2023-05-14 ENCOUNTER — Telehealth: Payer: Self-pay | Admitting: Internal Medicine

## 2023-05-14 NOTE — Telephone Encounter (Signed)
Called patient regarding rescheduling cancelled appointment, left a voicemail.,

## 2023-05-15 ENCOUNTER — Ambulatory Visit (INDEPENDENT_AMBULATORY_CARE_PROVIDER_SITE_OTHER): Payer: Medicare Other | Admitting: Student

## 2023-05-15 VITALS — BP 132/69 | HR 78 | Ht 62.0 in | Wt 185.2 lb

## 2023-05-15 DIAGNOSIS — E1165 Type 2 diabetes mellitus with hyperglycemia: Secondary | ICD-10-CM | POA: Diagnosis present

## 2023-05-15 NOTE — Progress Notes (Signed)
    SUBJECTIVE:   CHIEF COMPLAINT / HPI:   T2DM Patient enrolled in [place] study with pharmacy.  States around 3 AM his phone beeped and disconnected with Josephine Igo, can no longer see blood sugar recordings.  He does have glucose meter at home states he does not want to prick his finger if he does not have to.  Really wants connection to be fixed in the office today.  PERTINENT  PMH / PSH: T2DM, HTN, neuropathy  OBJECTIVE:   BP 132/69 (BP Location: Left Arm, Patient Position: Sitting, Cuff Size: Normal)   Pulse 78   Ht 5\' 2"  (1.575 m)   Wt 185 lb 3.2 oz (84 kg)   SpO2 97%   BMI 33.87 kg/m    General: NAD, pleasant, able to participate in exam Cardiac: Well-perfused Respiratory: Breathing comfortably on room air Skin: warm and dry, Libre present on right upper arm Neuro: alert, no obvious focal deficits Psych: Normal affect and mood  ASSESSMENT/PLAN:   Type 2 diabetes mellitus with hyperglycemia (HCC) Patient's device is in Spanish and I cannot read the instructions.  I was not sure how to connect with the Josephine Igo to his medical device.  He is advised to check his blood sugars with his glucose meter at home until he can schedule an appointment with the pharmacy team for troubleshooting.     Dr. Erick Alley, DO [facility]

## 2023-05-17 NOTE — Assessment & Plan Note (Signed)
Patient's device is in Spanish and I cannot read the instructions.  I was not sure how to connect with the Blake Vaughn to his medical device.  He is advised to check his blood sugars with his glucose meter at home until he can schedule an appointment with the pharmacy team for troubleshooting.

## 2023-05-18 ENCOUNTER — Telehealth: Payer: Self-pay | Admitting: Internal Medicine

## 2023-05-18 NOTE — Telephone Encounter (Signed)
Left msg for pt via interpreter

## 2023-05-20 DIAGNOSIS — H35372 Puckering of macula, left eye: Secondary | ICD-10-CM | POA: Diagnosis not present

## 2023-05-28 ENCOUNTER — Inpatient Hospital Stay: Payer: Medicare Other | Admitting: Internal Medicine

## 2023-05-28 ENCOUNTER — Telehealth: Payer: Self-pay | Admitting: *Deleted

## 2023-05-28 NOTE — Telephone Encounter (Signed)
PC to patient along with Raynelle Fanning, Spanish interpreter, regarding his missed appointment today.  Patient states he was unaware of his appointment & that he has trouble retrieving his voice mails.  Informed patient scheduling will contact him.  He verbalizes understanding.  Scheduling message sent to contact patient using spanish interpreter Raynelle Fanning.

## 2023-06-01 ENCOUNTER — Inpatient Hospital Stay: Payer: Medicare Other | Attending: Nurse Practitioner | Admitting: Internal Medicine

## 2023-06-01 VITALS — BP 150/79 | HR 80 | Temp 97.9°F | Resp 18 | Ht 62.0 in | Wt 185.9 lb

## 2023-06-01 DIAGNOSIS — G620 Drug-induced polyneuropathy: Secondary | ICD-10-CM | POA: Diagnosis not present

## 2023-06-01 DIAGNOSIS — Z79899 Other long term (current) drug therapy: Secondary | ICD-10-CM | POA: Diagnosis not present

## 2023-06-01 DIAGNOSIS — I10 Essential (primary) hypertension: Secondary | ICD-10-CM | POA: Insufficient documentation

## 2023-06-01 DIAGNOSIS — Z7984 Long term (current) use of oral hypoglycemic drugs: Secondary | ICD-10-CM | POA: Insufficient documentation

## 2023-06-01 DIAGNOSIS — Z87891 Personal history of nicotine dependence: Secondary | ICD-10-CM | POA: Insufficient documentation

## 2023-06-01 DIAGNOSIS — G43009 Migraine without aura, not intractable, without status migrainosus: Secondary | ICD-10-CM | POA: Diagnosis not present

## 2023-06-01 DIAGNOSIS — G629 Polyneuropathy, unspecified: Secondary | ICD-10-CM | POA: Insufficient documentation

## 2023-06-01 DIAGNOSIS — Z85038 Personal history of other malignant neoplasm of large intestine: Secondary | ICD-10-CM | POA: Insufficient documentation

## 2023-06-01 DIAGNOSIS — T451X5S Adverse effect of antineoplastic and immunosuppressive drugs, sequela: Secondary | ICD-10-CM | POA: Insufficient documentation

## 2023-06-01 DIAGNOSIS — E119 Type 2 diabetes mellitus without complications: Secondary | ICD-10-CM | POA: Diagnosis not present

## 2023-06-01 DIAGNOSIS — Z7952 Long term (current) use of systemic steroids: Secondary | ICD-10-CM | POA: Insufficient documentation

## 2023-06-01 MED ORDER — AMITRIPTYLINE HCL 50 MG PO TABS: 50.0000 mg | ORAL_TABLET | Freq: Every day | ORAL | 2 refills | Status: DC

## 2023-06-01 MED ORDER — NAPROXEN 500 MG PO TABS: 500.0000 mg | ORAL_TABLET | Freq: Two times a day (BID) | ORAL | 1 refills | Status: DC | PRN

## 2023-06-01 NOTE — Progress Notes (Signed)
[facility] [address]  [place], [place] [address] [phone]   New Patient Evaluation  Date of Service: 06/01/23 Patient Name: Blake Vaughn Patient MRN: [id number] Patient DOB: [birth year] Provider: Henreitta Leber, MD  Identifying Statement:  Blake Vaughn is a 62 y.o. male with Neuropathy  Migraine without aura and without status migrainosus, not intractable who presents for initial consultation and evaluation regarding cancer associated neurologic deficits.    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[facility]) Staging form: Colon and Rectum, AJCC 8th Edition - Pathologic stage from 10/21/2018: Stage IVC (pT4b, pN1a, pM1c) - Signed by Malachy Mood, MD on 11/13/2018 Total positive nodes: 1 Histologic grading system: 4 grade system Histologic grade (G): G2 Residual tumor (R): R0 - None Sites of metastasis: Peritoneum Lymph-vascular invasion (LVI): LVI present/identified, NOS  Follicular lymphoma (HCC), History of Staging form: Lymphoid Neoplasms, AJCC 6th Edition - Clinical: Stage II - Signed by Si Gaul, MD on 02/01/2014     Follicular lymphoma [facility]), History of  11/2009 Initial Diagnosis   Follicular lymphoma (HCC), History of    Chemotherapy   1) Status post 6 cycles of systemic chemotherapy with CHOP/Rituxan last dose given 05/01/2009.  2) Maintenance Rituxan at 375 mg per meter square given every 2 months status post 12 cycles    Cancer of right colon (HCC)  10/21/2018 Surgery   Exploratory laparotomy right hemicolectomy by Dr. Cliffton Asters and Dr. Magnus Ivan  10/21/18   10/21/2018 Pathology Results   Diagnosis 10/21/18 Colon, segmental resection for tumor, right ascending and appendix - ADENOCARCINOMA, MODERATE TO POORLY DIFFERENTIATED (4 CM) - METASTATIC CARCINOMA  INVOLVING ONE OF EIGHTEEN LYMPH NODES (1/18) - CARCINOMA EXTENDS INTO THE APPENDIX - TWO TUMOR DEPOSITS PRESENT - SEE ONCOLOGY TABLE AND COMMENT BELOW   10/21/2018 Cancer Staging   Staging form: Colon and Rectum, AJCC 8th Edition - Pathologic stage from 10/21/2018: Stage IVC (pT4b, pN1a, pM1c) - Signed by Malachy Mood, MD on 11/13/2018   11/04/2018 Imaging   CT AP W Contrast 11/04/18  IMPRESSION: 1. Interval appendectomy. There is residual soft tissue thickening and stranding in the right lower quadrant adjacent to the cecum which may represent ongoing inflammation versus postsurgical changes. A small soft tissue density adjacent to the surgical sutures may reflect small hematoma or operative collection. No large focal fluid collection to suggest drainable abscess allowing for absence of contrast 2. Fluid-filled colon without wall thickening, could reflect diarrheal process   11/08/2018 Imaging   CT Chest W Contrast 11/08/18  IMPRESSION: 1. No acute findings are noted in the thorax to account for the patient's symptoms. 2. Aortic atherosclerosis, in addition to left main and 3 vessel coronary artery disease. Please note that although the presence of coronary artery calcium documents the presence of coronary artery disease, the severity of this disease and any potential stenosis cannot be assessed on this non-gated CT examination. Assessment for potential risk factor modification, dietary therapy or pharmacologic therapy may be warranted, if clinically indicated. 3. Additional incidental findings, as above. Aortic Atherosclerosis (ICD10-I70.0).   11/12/2018 Initial Diagnosis   Cancer of right colon (HCC)   11/25/2018 - 05/05/2019 Chemotherapy   FOLFOX q2weeks starting 11/25/18. Due to moderate thrombocytopenia, I will stop 5-FU bolus, and reduce pump infusion to 2200mg /m2 starting with cycle 7.  Plan for last treatment on 05/05/19.  02/10/2019 Imaging   CT AP W Contrast    IMPRESSION: 1. No evidence metastatic disease. 2. Hepatic steatosis. 3. Mildly enlarged prostate. 4. Aortic atherosclerosis (ICD10-170.0). coronary artery calcification.   09/02/2019 Imaging   CT AP W Contrast IMPRESSION: 1. No signs to suggest metastatic disease in the abdomen or pelvis. 2. Aortic atherosclerosis these, as well as right coronary artery disease.   03/01/2020 Imaging   CT CAP W Contrast  IMPRESSION: 1. Stable exam. No new or progressive findings to suggest recurrent or metastatic disease. 2. Status post right hemicolectomy. 3. Aortic Atherosclerosis (ICD10-I70.0).   09/07/2020 Imaging   CT AP  IMPRESSION: 1. Status post right hemicolectomy, without recurrent or metastatic disease. 2. Coronary artery atherosclerosis. Aortic Atherosclerosis (ICD10-I70.0).   09/24/2021 Imaging   CT CAP  IMPRESSION: 1. Status post partial right hemicolectomy and ileocolic anastomosis. 2. No evidence of recurrent or metastatic disease in the chest, abdomen, or pelvis. 3. Mild, diffuse bilateral bronchial wall thickening, consistent with nonspecific infectious or inflammatory bronchitis. 4. Hepatic steatosis. 5. Prostatomegaly. Thickening of the decompressed urinary bladder, likely secondary to chronic outlet obstruction. 6. Coronary artery disease.   Aortic Atherosclerosis (ICD10-I70.0).     History of Present Illness: The patient's records from the referring physician were obtained and reviewed and the patient interviewed to confirm this HPI.  Blake Vaughn presents today to review headache syndrome.  He describes 5 years history of headaches "all over the head, lasting all day, associated with photophobia".  Pain might last for an entire week, but only ~1 week per month on average.  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  Sleep has  been very poor, he only sleeps 5-6 hours per night.   He is tired during the day, isn't aware of any snoring.  Takes tylenol for headaches when needed but this is ineffective.  Medications: Current Outpatient Medications on File Prior to Visit  Medication Sig Dispense Refill   diphenhydrAMINE (BENADRYL) 50 MG tablet Take 1 tablet (50 mg total) by mouth as needed for itching. 1 hour before injection (Patient not taking: Reported on 05/15/2023) 30 tablet 0   Accu-Chek Softclix Lancets lancets Use lancets to check sugars 2-3 times per day (Patient not taking: Reported on 05/15/2023) 100 each 12   atorvastatin (LIPITOR) 20 MG tablet Take 1 tablet (20 mg total) by mouth daily. (Patient not taking: Reported on 04/10/2023) 90 tablet 3   baclofen 5 MG TABS Take 1 tablet (5 mg total) by mouth at bedtime as needed for muscle spasms. (Patient not taking: Reported on 04/10/2023) 7 tablet 0   Blood Glucose Monitoring Suppl (ACCU-CHEK GUIDE) w/Device KIT 1 kit by Does not apply route daily. (Patient not taking: Reported on 04/14/2023) 1 kit 0   empagliflozin (JARDIANCE) 10 MG TABS tablet Take 1 tablet (10 mg total) by mouth daily. 30 tablet 1   gabapentin (NEURONTIN) 100 MG capsule Take 1 capsule (100 mg total) by mouth at bedtime. (Patient not taking: Reported on 05/15/2023) 30 capsule 0   glipiZIDE (GLUCOTROL XL) 10 MG 24 hr tablet Take 1 tablet by mouth once daily with breakfast 90 tablet 0   glucose blood (ACCU-CHEK GUIDE) test strip Use strips to check blood sugar 2-3 times per day (Patient not taking: Reported on 05/15/2023) 100 each 12   metFORMIN (GLUCOPHAGE) 1000 MG tablet Take 1 tablet (1,000 mg total) by mouth 2 (two) times daily with a meal. 180  tablet 3   pantoprazole (PROTONIX) 40 MG tablet Take 1 tablet (40 mg total) by mouth daily. (Patient not taking: Reported on 05/15/2023) 30 tablet 2   predniSONE (STERAPRED UNI-PAK 21 TAB) 10 MG (21) TBPK tablet Take by mouth daily. Take 6 tabs by mouth daily  for 1 days, then 5 tabs for 1 days, then 4 tabs for 1 days, then 3  tabs for 1 days, 2 tabs for 1 days, then 1 tab by mouth daily for 1 days (Patient not taking: Reported on 05/15/2023) 21 tablet 0   triamcinolone ointment (KENALOG) 0.5 % Apply 1 Application topically 2 (two) times daily. 30 g 0   No current facility-administered medications on file prior to visit.    Allergies:  Allergies  Allergen Reactions   Iohexol Other (See Comments)     Code: HIVES, Desc: pt had itching 8 hrs after 6/10 scan;and now was today 08/16/09 was given 50mg  benadryl 1 hr before scan and he did fine.Amy Rogal, Onset Date: [id number] *PLEASE HAVE PT TAKE 50MG  OF BENADRYL 1HR PRIOR TO SCAN*    Past Medical History:  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   Qualifier: Diagnosis of  By: Arlyce Dice MD, Barbette Hair    GI bleed 11/05/2018   Hypercholesterolemia    Hypertension 11/27/2011   Lymphoma (HCC)    gets annual chemo last tx Feb 2013   TOBACCO USE, QUIT 08/18/2009   Qualifier: Diagnosis of  By: Lanier Prude  MD, Cathrine Muster     Past Surgical History:  Past Surgical History:  Procedure Laterality Date   ESOPHAGOGASTRODUODENOSCOPY (EGD) WITH PROPOFOL N/A 11/05/2018   Procedure: ESOPHAGOGASTRODUODENOSCOPY (EGD) WITH PROPOFOL;  Surgeon: Bernette Redbird, MD;  Location: [facility] ENDOSCOPY;  Service: Endoscopy;  Laterality: N/A;   IR IMAGING GUIDED PORT INSERTION  11/24/2018   IR REMOVAL TUN ACCESS W/ PORT W/O FL MOD SED  11/11/2021   LAPAROSCOPIC APPENDECTOMY N/A 10/21/2018   Procedure: Exploratory laparotomy right hemicolectomy;  Surgeon: Andria Meuse, MD;  Location: [facility] OR;  Service: General;  Laterality: N/A;   Social History:  Social History   Socioeconomic History   Marital status: Married    Spouse name: Not on file   Number of children: 2   Years of education: Not on file   Highest education level: Not on file  Occupational History   Not on file  Tobacco Use   Smoking status:  Former    Packs/day: 0.50    Years: 30.00    Additional pack years: 0.00    Total pack years: 15.00    Types: Cigarettes    Quit date: 12/08/2004    Years since quitting: 18.4   Smokeless tobacco: Former    Quit date: 11/20/2006  Vaping Use   Vaping Use: Never used  Substance and Sexual Activity   Alcohol use: No   Drug use: No   Sexual activity: Not on file  Other Topics Concern   Not on file  Social History Narrative   Not on file   Social Determinants of Health   Financial Resource Strain: Not on file  Food Insecurity: Not on file  Transportation Needs: Not on file  Physical Activity: Not on file  Stress: Not on file  Social Connections: Not on file  Intimate Partner Violence: Not on file   Family History:  Family History  Problem Relation Age of Onset   Diabetes Mother  Diabetes Father    Renal Disease Brother    Diabetes Brother    Cancer Brother        lymphoma     Review of Systems: Constitutional: Doesn't report fevers, chills or abnormal weight loss Eyes: Doesn't report blurriness of vision Ears, nose, mouth, throat, and face: Doesn't report sore throat Respiratory: Doesn't report cough, dyspnea or wheezes Cardiovascular: Doesn't report palpitation, chest discomfort  Gastrointestinal:  Doesn't report nausea, constipation, diarrhea GU: Doesn't report incontinence Skin: Doesn't report skin rashes Neurological: Per HPI Musculoskeletal: Doesn't report joint pain Behavioral/Psych: Doesn't report anxiety  Physical Exam: Vitals:   06/01/23 0903  BP: (!) 150/79  Pulse: 80  Resp: 18  Temp: 97.9 F (36.6 C)  SpO2: 98%   KPS: 80. General: Alert, cooperative, pleasant, in no acute distress Head: Normal EENT: No conjunctival injection or scleral icterus.  Lungs: Resp effort normal Cardiac: Regular rate Abdomen: Non-distended abdomen Skin: No rashes cyanosis or petechiae. Extremities: No clubbing or edema  Neurologic Exam: Mental Status: Awake,  alert, attentive to examiner. Oriented to self and environment. Language is fluent with intact comprehension.  Cranial Nerves: Visual acuity is grossly normal. Visual fields are full. Extra-ocular movements intact. No ptosis. Face is symmetric Motor: Tone and bulk are normal. Power is full in both arms and legs. Reflexes are symmetric, no pathologic reflexes present.  Sensory: Impaired in stocking pattern Gait: Sensory dystaxia   Labs: I have reviewed the data as listed    Component Value Date/Time   NA 133 (L) 04/14/2023 0857   NA 140 01/20/2022 0923   NA 133 (L) 09/09/2016 1338   K 3.9 04/14/2023 0857   K 5.1 09/09/2016 1338   CL 103 04/14/2023 0857   CL 102 01/26/2013 0805   CO2 22 04/14/2023 0857   CO2 20 (L) 09/09/2016 1338   GLUCOSE 240 (H) 04/14/2023 0857   GLUCOSE 293 (H) 09/09/2016 1338   GLUCOSE 268 (H) 01/26/2013 0805   BUN 30 (H) 04/14/2023 0857   BUN 20 01/20/2022 0923   BUN 18.9 09/09/2016 1338   CREATININE 0.99 04/14/2023 0857   CREATININE 1.0 09/09/2016 1338   CALCIUM 9.2 04/14/2023 0857   CALCIUM 9.8 09/09/2016 1338   PROT 6.6 04/14/2023 0857   PROT 5.9 (L) 07/16/2017 1502   PROT 6.7 09/09/2016 1338   ALBUMIN 3.8 04/14/2023 0857   ALBUMIN 4.0 07/16/2017 1502   ALBUMIN 3.6 09/09/2016 1338   AST 18 04/14/2023 0857   AST 14 09/09/2016 1338   ALT 20 04/14/2023 0857   ALT 22 09/09/2016 1338   ALKPHOS 62 04/14/2023 0857   ALKPHOS 83 09/09/2016 1338   BILITOT 1.0 04/14/2023 0857   BILITOT 0.40 09/09/2016 1338   GFRNONAA >60 04/14/2023 0857   GFRNONAA >89 04/02/2016 1229   GFRAA >60 09/07/2020 1413   GFRAA >89 04/02/2016 1229   Lab Results  Component Value Date   WBC 6.9 04/14/2023   NEUTROABS 4.5 04/14/2023   HGB 15.7 04/14/2023   HCT 44.5 04/14/2023   MCV 86.1 04/14/2023   PLT 179 04/14/2023    Imaging: CLINICAL DATA:  Palpitations and abnormal head CT   EXAM: MRI HEAD WITHOUT CONTRAST   TECHNIQUE: Multiplanar, multiecho pulse sequences of  the brain and surrounding structures were obtained without intravenous contrast.   COMPARISON:  Head CT 10/09/2021   FINDINGS: Brain: No acute infarct, mass effect or extra-axial collection. No acute or chronic hemorrhage. There is multifocal hyperintense T2-weighted signal within the white matter. Generalized volume  loss without a clear lobar predilection. 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 This includes the area of hypodensity described on the earlier head CT. The midline structures are normal.   Vascular: Major flow voids are preserved.   Skull and upper cervical spine: Normal calvarium and skull base. Visualized upper cervical spine and soft tissues are normal.   Sinuses/Orbits:No paranasal sinus fluid levels or advanced mucosal thickening. No mastoid or middle ear effusion. Normal orbits.   IMPRESSION: 1. No acute intracranial abnormality. 2. 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 This includes the area of hypodensity described on the earlier head CT.     Electronically Signed   By: Deatra Robinson M.D.   On: 10/09/2021 22:21  Assessment/Plan Neuropathy  Migraine without aura and without status migrainosus, not intractable  ANTUAN LIMES presents with syndrome consistent with chronic daily headache with migrainous features.  This is likely exacerbated or provoked by chronic sleep issues.  Brain MRI from 2022 (done for headache) was reviewed.  Discussed and recommended initiating headache prophylaxis with trial of Elavil 50mg  HS.  This may help with sleep issues and neuropathy as well.  Also considered propanolol given lack of hypertension regimen.  He is agreeable with this.  For acute headaches, recommended naprosyn 500mg  PRN at earliest sign of pain.  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  Main limitations are  somatosensory and gait, which are less amenable to neuropathic pain medications.  Will defer gabapentin at this time.  We spent twenty additional minutes teaching regarding the natural history, biology, and historical experience in the treatment of neurologic complications of cancer.   We appreciate the opportunity to participate in the care of Blake Vaughn.   We ask that Blake Vaughn return to clinic in 1 months, or sooner as needed.  All questions were answered. The patient knows to call the clinic with any problems, questions or concerns. No barriers to learning were detected.  The total time spent in the encounter was 40 minutes and more than 50% was on counseling and review of test results   Henreitta Leber, MD Medical Director of Neuro-Oncology [facility] 06/01/23 9:02 AM

## 2023-06-02 ENCOUNTER — Ambulatory Visit (INDEPENDENT_AMBULATORY_CARE_PROVIDER_SITE_OTHER): Payer: Medicare Other | Admitting: Student

## 2023-06-02 VITALS — BP 138/72 | HR 73 | Ht 62.0 in | Wt 185.6 lb

## 2023-06-02 DIAGNOSIS — Z23 Encounter for immunization: Secondary | ICD-10-CM

## 2023-06-02 DIAGNOSIS — S91331A Puncture wound without foreign body, right foot, initial encounter: Secondary | ICD-10-CM | POA: Diagnosis present

## 2023-06-02 MED ORDER — LEVOFLOXACIN 500 MG PO TABS
500.0000 mg | ORAL_TABLET | Freq: Every day | ORAL | 0 refills | Status: DC
Start: 2023-06-02 — End: 2023-10-14

## 2023-06-02 NOTE — Progress Notes (Signed)
    SUBJECTIVE:   CHIEF COMPLAINT / HPI:   Penetrating Wound of the R Foot Was cutting grass in his yard this weekend when he stepped on a nail that punctured his foot through his shoe. He thinks it was rather shallow but is concerned because of his diabetes. He tells me that he does not know when his last tetanus vaccine was but is confident that he had his normal childhood series as a youth in [place]. No pain or fevers/chills.   PERTINENT  PMH / PSH: Hx Colon Cancer s/p hemicolectomy; Follicular lymphoma s/p chemo; diabetes ; diabetic retinopathy   OBJECTIVE:   BP 138/72   Pulse 73   Ht 5\' 2"  (1.575 m)   Wt 185 lb 9.6 oz (84.2 kg)   SpO2 97%   BMI 33.95 kg/m   Gen: In good spirits, NAD Ext: Right foot with intact sensation to five point testing, no pain over the puncture site. No erythema, warmth, or underlying fluctuance.     ASSESSMENT/PLAN:   Penetrating wound of right foot No evidence of infection at present, however given underlying diabetes and mechanism of injury through an intact shoe, shared decision to cover with three day of prophylactic abx with pseudomonal coverage. - Levofloxacin 500mg  daily x3 days - Patient to perform daily foot exams until healed, return if worsening - TDaP administered in clinic, Tetanus Ig not indicated     J Dorothyann Gibbs, MD [facility]

## 2023-06-02 NOTE — Patient Instructions (Addendum)
Quiero que mires la planta de tu pie todos los das hasta que la pequea herida sane por completo. Con su diabetes, corre un mayor riesgo de que esta herida no sane. Si notas que empeora, vuelve a vernos.  Te envo un antibitico para [employer]. Esto est destinado a ayudar a evitar que esta herida se infecte. NO tome glipizida los [employer] est tomando el antibitico. Puede reiniciar su glipizida al da siguiente de su ltima dosis de antibitico.   I want you to look at the bottom of your foot every day until the small wound heals entirely. With your diabetes, you are at higher risk of this wound not healing. If you notice it getting worse, please come back to see [place].  I am sending three days of an antibiotic for you. This is meant to help prevent this wound from becoming infected. DO NOT take your glipizide on days that you are taking the antibiotic. You can restart your glipizide the day after your last antibiotic dose.  Eliezer Mccoy, MD

## 2023-06-02 NOTE — Assessment & Plan Note (Signed)
No evidence of infection at present, however given underlying diabetes and mechanism of injury through an intact shoe, shared decision to cover with three day of prophylactic abx with pseudomonal coverage. - Levofloxacin 500mg  daily x3 days - Patient to perform daily foot exams until healed, return if worsening - TDaP administered in clinic, Tetanus Ig not indicated

## 2023-06-03 ENCOUNTER — Ambulatory Visit: Payer: Medicare Other | Admitting: Pharmacist

## 2023-06-03 ENCOUNTER — Telehealth: Payer: Self-pay | Admitting: Internal Medicine

## 2023-06-03 NOTE — Telephone Encounter (Signed)
Called patient regarding July appointments, left a voicemail. 

## 2023-06-05 ENCOUNTER — Other Ambulatory Visit: Payer: Self-pay

## 2023-06-05 ENCOUNTER — Telehealth: Payer: Self-pay | Admitting: Medical Oncology

## 2023-06-05 NOTE — Telephone Encounter (Signed)
Medication education-naproxen and Amitriptyline  Pt presented to the lobby with his pill bottles . He asked for instructions on taking his Naproxen and Amitriptyline. Using a Spanish interpretor I reviewed each drug , dose , instructions  and indication. He voiced  understanding and repeated back to me the correct instructions.  He said he took one Amitriptyline Wednesday night at 8 pm and was sleepy /groggy until 11 am . He said at that time he felt relaxed and felt like good.  He did not take it last night.  "It may take me a few days to get used to it". He is going to take it again tonight.   Gabapentin-this bottle was empty .I told him I will talk to [place] mid week.  I asked him to call Monday with an update on how he tolerated his medications over the weekend.

## 2023-06-27 ENCOUNTER — Encounter (HOSPITAL_COMMUNITY): Payer: Self-pay

## 2023-06-27 ENCOUNTER — Ambulatory Visit (HOSPITAL_COMMUNITY)
Admission: EM | Admit: 2023-06-27 | Discharge: 2023-06-27 | Disposition: A | Discharge status: Home / Self Care | Payer: Medicare Other

## 2023-06-27 DIAGNOSIS — K429 Umbilical hernia without obstruction or gangrene: Secondary | ICD-10-CM | POA: Diagnosis not present

## 2023-06-27 NOTE — Discharge Instructions (Signed)
You have an umbilical hernia.  Review the attached information about umbilical hernias that I have provided in your discharge packet. Take tylenol as needed for pain. Warm compresses.  Schedule an appointment with family medicine provider for follow-up.  Schedule appointment with [facility] for further evaluation as they will likely do an ultrasound and discuss ways to fix this hernia.  If you develop any new or worsening symptoms or if your symptoms do not start to improve, pleases return here or follow-up with your primary care provider. If your symptoms are severe, please go to the emergency room.

## 2023-06-27 NOTE — ED Triage Notes (Signed)
Pt presents to office for lower abdominal pain that started today. Denies any vomiting or diarrhea.

## 2023-06-27 NOTE — ED Provider Notes (Addendum)
[facility]    CSN: [id number] Arrival date & time: 06/27/23  1613      History   Chief Complaint Chief Complaint  Patient presents with   Abdominal Cramping    HPI Blake Vaughn is a 62 y.o. male.   Patient presents to urgent care for evaluation of generalized mid abdominal pain that started 14 hours ago at 3am this morning. Pain woke him up out of his sleep at 3am and he wasn't able to go back to sleep due to abdominal pain. Describes pain as stabbing and sharp that comes and goes in severity but never completely goes away. Unsure of any triggering or relieving factors for pain.  No nausea, vomiting, diarrhea, fever, chills, urinary symptoms, or blood/mucous to the stools. He was able to have a normal bowel movement today without blood or mucous to the stool. No personal or family history of kidney stones. History of appendectomy, no other history of abdominal surgeries. History of colon cancer and type 2 diabetes. Sugars have been well controlled. Has not attempted any OTC treatments for pain PTA.   The history is provided by the patient. The history is limited by a language barrier. A language interpreter was used (Spanish medical interpreter used for entirety of patient encounter).  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   Qualifier: Diagnosis of  By: Arlyce Dice MD, Barbette Hair    GI bleed 11/05/2018   Hypercholesterolemia    Hypertension 11/27/2011   Lymphoma (HCC)    gets annual chemo last tx Feb 2013   TOBACCO USE, QUIT 08/18/2009   Qualifier: Diagnosis of  By: Lanier Prude  MD, Taineisha      Patient Active Problem List   Diagnosis Date Noted   Penetrating wound of right foot 06/02/2023   Migraine headache without aura 06/01/2023   Gastroesophageal reflux disease without esophagitis 04/02/2023   Blurred vision 04/30/2021   Microalbuminuria  06/05/2020   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 12/24/2019   Neuropathy 07/30/2019   Port-A-Cath in place 11/25/2018   Cancer of right colon (HCC) 11/12/2018   Syncope due to orthostatic hypotension 11/05/2018   Hypoalbuminemia 11/05/2018   Hypoproteinemia (HCC) 11/05/2018   Uses Spanish as primary spoken language 10/23/2018   Obesity (BMI 30-39.9) 10/23/2018   Mass of cecum s/p right colectomy 10/21/2018 10/22/2018   Nocturnal leg cramps 07/16/2017   HTN (hypertension) 11/27/2011   Chronic nonintractable headache 10/16/2011   Pure hypercholesterolemia 09/24/2009   ERECTILE DYSFUNCTION, 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   Type 2 diabetes mellitus with hyperglycemia (HCC) 07/09/2009   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/09/2009   Follicular lymphoma (HCC), 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    Past Surgical History:  Procedure Laterality Date   ESOPHAGOGASTRODUODENOSCOPY (EGD) WITH PROPOFOL N/A 11/05/2018   Procedure: ESOPHAGOGASTRODUODENOSCOPY (EGD) WITH PROPOFOL;  Surgeon: Bernette Redbird, MD;  Location: [facility] ENDOSCOPY;  Service: Endoscopy;  Laterality: N/A;   IR IMAGING GUIDED PORT INSERTION  11/24/2018   IR REMOVAL TUN ACCESS W/ PORT W/O FL MOD SED  11/11/2021   LAPAROSCOPIC APPENDECTOMY N/A 10/21/2018   Procedure: Exploratory laparotomy right hemicolectomy;  Surgeon: Andria Meuse, MD;  Location: [facility] OR;  Service: General;  Laterality: N/A;       Home Medications    Prior to Admission medications   Medication Sig Start Date End Date Taking? Authorizing Provider  Accu-Chek Softclix Lancets lancets Use lancets to check  sugars 2-3 times per day 12/11/22  Yes Lilland, Alana, DO  amitriptyline (ELAVIL) 50 MG tablet Take 1 tablet (50 mg total) by mouth at bedtime. 06/01/23  Yes Vaslow, Georgeanna Lea, MD  Blood Glucose Monitoring Suppl (ACCU-CHEK GUIDE) w/Device KIT 1 kit by Does not apply route daily. 12/05/22  Yes Lilland, Alana, DO  empagliflozin (JARDIANCE) 10 MG TABS tablet Take 1 tablet (10 mg total) by  mouth daily. 10/16/22  Yes Littie Deeds, MD  glipiZIDE (GLUCOTROL XL) 10 MG 24 hr tablet Take 1 tablet by mouth once daily with breakfast 04/14/23  Yes Lilland, Alana, DO  glucose blood (ACCU-CHEK GUIDE) test strip Use strips to check blood sugar 2-3 times per day 12/11/22  Yes Lilland, Alana, DO  levofloxacin (LEVAQUIN) 500 MG tablet Take 1 tablet (500 mg total) by mouth daily. 06/02/23  Yes Alicia Amel, MD  metFORMIN (GLUCOPHAGE) 1000 MG tablet Take 1 tablet (1,000 mg total) by mouth 2 (two) times daily with a meal. 07/11/22  Yes Lilland, Alana, DO  naproxen (NAPROSYN) 500 MG tablet Take 1 tablet (500 mg total) by mouth 2 (two) times daily as needed for headache. 06/01/23  Yes Vaslow, Georgeanna Lea, MD    Family History Family History  Problem Relation Age of Onset   Diabetes Mother    Diabetes Father    Renal Disease Brother    Diabetes Brother    Cancer Brother        lymphoma     Social History Social History   Tobacco Use   Smoking status: Former    Current packs/day: 0.00    Average packs/day: 0.5 packs/day for 30.0 years (15.0 ttl pk-yrs)    Types: Cigarettes    Start date: 12/08/1974    Quit date: 12/08/2004    Years since quitting: 18.5   Smokeless tobacco: Former    Quit date: 11/20/2006  Vaping Use   Vaping status: Never Used  Substance Use Topics   Alcohol use: No   Drug use: No     Allergies   Iohexol   Review of Systems Review of Systems Per HPI  Physical Exam Triage Vital Signs ED Triage Vitals [06/27/23 1639]  Encounter Vitals Group     BP 139/83     Systolic BP Percentile      Diastolic BP Percentile      Pulse Rate 91     Resp 16     Temp 99.1 F (37.3 C)     Temp Source Oral     SpO2 98 %     Weight      Height      Head Circumference      Peak Flow      Pain Score      Pain Loc      Pain Education      Exclude from Growth Chart    No data found.  Updated Vital Signs BP 139/83 (BP Location: Left Arm)   Pulse 91   Temp 99.1 F (37.3  C) (Oral)   Resp 16   SpO2 98%   Visual Acuity Right Eye Distance:   Left Eye Distance:   Bilateral Distance:    Right Eye Near:   Left Eye Near:    Bilateral Near:     Physical Exam Vitals and nursing note reviewed.  Constitutional:      Appearance: He is not ill-appearing or toxic-appearing.  HENT:     Head: Normocephalic and atraumatic.     Right  Ear: Hearing and external ear normal.     Left Ear: Hearing and external ear normal.     Nose: Nose normal.     Mouth/Throat:     Lips: Pink.  Eyes:     General: Lids are normal. Vision grossly intact. Gaze aligned appropriately.     Extraocular Movements: Extraocular movements intact.     Conjunctiva/sclera: Conjunctivae normal.  Pulmonary:     Effort: Pulmonary effort is normal.  Abdominal:     General: Abdomen is protuberant. Bowel sounds are normal. There is no distension or abdominal bruit. There are no signs of injury.     Palpations: Abdomen is soft. There is no shifting dullness, fluid wave, hepatomegaly, splenomegaly, mass or pulsatile mass.     Tenderness: There is abdominal tenderness (Diffuse tenderness to abdomen, no peritoneal signs). There is no right CVA tenderness, left CVA tenderness, guarding or rebound.     Hernia: A hernia (Umbilical hernia present) is present. Hernia is present in the umbilical area.  Musculoskeletal:     Cervical back: Neck supple.  Skin:    General: Skin is warm and dry.     Capillary Refill: Capillary refill takes less than 2 seconds.     Findings: No rash.  Neurological:     General: No focal deficit present.     Mental Status: He is alert and oriented to person, place, and time. Mental status is at baseline.     Cranial Nerves: No dysarthria or facial asymmetry.  Psychiatric:        Mood and Affect: Mood normal.        Speech: Speech normal.        Behavior: Behavior normal.        Thought Content: Thought content normal.        Judgment: Judgment normal.      UC  Treatments / Results  Labs (all labs ordered are listed, but only abnormal results are displayed) Labs Reviewed - No data to display  EKG   Radiology No results found.  Procedures Procedures (including critical care time)  Medications Ordered in UC Medications - No data to display  Initial Impression / Assessment and Plan / UC Course  I have reviewed the triage vital signs and the nursing notes.  Pertinent labs & imaging results that were available during my care of the patient were reviewed by me and considered in my medical decision making (see chart for details).   1. Umbilical hernia without obstruction and without gangrene Presentation consistent with umbilical hernia. No red flag signs/symptoms indicating need for emergent workup in the ED. Stooling and voiding normally. Information given for follow-up with [facility] for follow-up general surgery consult to have this repaired.  May also follow-up with primary care provider (family medicine) in the next few days for reassessment. Strict ER return precautions discussed should he develop any new or worsening symptoms over the next 24 to 48 hours.  Otherwise, may follow-up with PCP.  Tylenol as needed for pain.  Warm compresses may soothe pain as well.  Advised to avoid lifting heavy objects as this may exacerbate symptoms.   Counseled patient on potential for adverse effects with medications prescribed/recommended today, strict ER and return-to-clinic precautions discussed, patient verbalized understanding.    Final Clinical Impressions(s) / UC Diagnoses   Final diagnoses:  Umbilical hernia without obstruction and without gangrene     Discharge Instructions      You have an umbilical hernia.  Review  the attached information about umbilical hernias that I have provided in your discharge packet. Take tylenol as needed for pain. Warm compresses.  Schedule an appointment with family medicine provider  for follow-up.  Schedule appointment with [facility] for further evaluation as they will likely do an ultrasound and discuss ways to fix this hernia.  If you develop any new or worsening symptoms or if your symptoms do not start to improve, pleases return here or follow-up with your primary care provider. If your symptoms are severe, please go to the emergency room.     ED Prescriptions   None    PDMP not reviewed this encounter.   Carlisle Beers, FNP 06/27/23 1733    Carlisle Beers, FNP 06/27/23 1734

## 2023-06-29 ENCOUNTER — Other Ambulatory Visit: Payer: Self-pay

## 2023-06-29 ENCOUNTER — Inpatient Hospital Stay: Payer: Medicare Other | Attending: Nurse Practitioner | Admitting: Internal Medicine

## 2023-06-29 VITALS — BP 133/73 | HR 73 | Temp 97.3°F | Resp 14 | Wt 184.0 lb

## 2023-06-29 DIAGNOSIS — Z9221 Personal history of antineoplastic chemotherapy: Secondary | ICD-10-CM | POA: Insufficient documentation

## 2023-06-29 DIAGNOSIS — Z8572 Personal history of non-Hodgkin lymphomas: Secondary | ICD-10-CM | POA: Diagnosis not present

## 2023-06-29 DIAGNOSIS — G620 Drug-induced polyneuropathy: Secondary | ICD-10-CM | POA: Diagnosis present

## 2023-06-29 DIAGNOSIS — Z87891 Personal history of nicotine dependence: Secondary | ICD-10-CM | POA: Insufficient documentation

## 2023-06-29 DIAGNOSIS — G43009 Migraine without aura, not intractable, without status migrainosus: Secondary | ICD-10-CM | POA: Insufficient documentation

## 2023-06-29 DIAGNOSIS — Z85038 Personal history of other malignant neoplasm of large intestine: Secondary | ICD-10-CM | POA: Diagnosis present

## 2023-06-29 MED ORDER — AMITRIPTYLINE HCL 25 MG PO TABS: 25.0000 mg | ORAL_TABLET | Freq: Every day | ORAL | 1 refills | Status: DC

## 2023-06-29 NOTE — Progress Notes (Signed)
[facility] [address]  [place], [place] [address] [phone]   Interval Evaluation  Date of Service: 06/29/23 Patient Name: Blake Vaughn Patient MRN: [id number] Patient DOB: 09/19/1961 Provider: Henreitta Leber, MD  Identifying Statement:  Blake Vaughn is a 62 y.o. 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[facility]) Staging form: Colon and Rectum, AJCC 8th Edition - Pathologic stage from 10/21/2018: Stage IVC (pT4b, pN1a, pM1c) - Signed by Malachy Mood, MD on 11/13/2018 Total positive nodes: 1 Histologic grading system: 4 grade system Histologic grade (G): G2 Residual tumor (R): R0 - None Sites of metastasis: Peritoneum Lymph-vascular invasion (LVI): LVI present/identified, NOS  Follicular lymphoma (HCC), History of Staging form: Lymphoid Neoplasms, AJCC 6th Edition - Clinical: Stage II - Signed by Si Gaul, MD on 02/01/2014     Follicular lymphoma [facility]), History of  11/2009 Initial Diagnosis   Follicular lymphoma (HCC), History of    Chemotherapy   1) Status post 6 cycles of systemic chemotherapy with CHOP/Rituxan last dose given 05/01/2009.  2) Maintenance Rituxan at 375 mg per meter square given every 2 months status post 12 cycles    Cancer of right colon (HCC)  10/21/2018 Surgery   Exploratory laparotomy right hemicolectomy by Dr. Cliffton Asters and Dr. Magnus Ivan  10/21/18   10/21/2018 Pathology Results   Diagnosis 10/21/18 Colon, segmental resection for tumor, right ascending and appendix - ADENOCARCINOMA, MODERATE TO POORLY DIFFERENTIATED (4 CM) - METASTATIC CARCINOMA INVOLVING ONE OF EIGHTEEN LYMPH NODES (1/18) - CARCINOMA EXTENDS INTO THE APPENDIX - TWO TUMOR DEPOSITS PRESENT - SEE ONCOLOGY TABLE AND COMMENT BELOW   10/21/2018 Cancer Staging   Staging form: Colon  and Rectum, AJCC 8th Edition - Pathologic stage from 10/21/2018: Stage IVC (pT4b, pN1a, pM1c) - Signed by Malachy Mood, MD on 11/13/2018   11/04/2018 Imaging   CT AP W Contrast 11/04/18  IMPRESSION: 1. Interval appendectomy. There is residual soft tissue thickening and stranding in the right lower quadrant adjacent to the cecum which may represent ongoing inflammation versus postsurgical changes. A small soft tissue density adjacent to the surgical sutures may reflect small hematoma or operative collection. No large focal fluid collection to suggest drainable abscess allowing for absence of contrast 2. Fluid-filled colon without wall thickening, could reflect diarrheal process   11/08/2018 Imaging   CT Chest W Contrast 11/08/18  IMPRESSION: 1. No acute findings are noted in the thorax to account for the patient's symptoms. 2. Aortic atherosclerosis, in addition to left main and 3 vessel coronary artery disease. Please note that although the presence of coronary artery calcium documents the presence of coronary artery disease, the severity of this disease and any potential stenosis cannot be assessed on this non-gated CT examination. Assessment for potential risk factor modification, dietary therapy or pharmacologic therapy may be warranted, if clinically indicated. 3. Additional incidental findings, as above. Aortic Atherosclerosis (ICD10-I70.0).   11/12/2018 Initial Diagnosis   Cancer of right colon (HCC)   11/25/2018 - 05/05/2019 Chemotherapy   FOLFOX q2weeks starting 11/25/18. Due to moderate thrombocytopenia, I will stop 5-FU bolus, and reduce pump infusion to 2200mg /m2 starting with cycle 7.  Plan for last treatment on 05/05/19.    02/10/2019 Imaging   CT AP W Contrast   IMPRESSION: 1. No evidence metastatic disease. 2. Hepatic steatosis. 3. Mildly enlarged prostate. 4. Aortic atherosclerosis (ICD10-170.0).  coronary artery calcification.   09/02/2019 Imaging   CT AP W  Contrast IMPRESSION: 1. No signs to suggest metastatic disease in the abdomen or pelvis. 2. Aortic atherosclerosis these, as well as right coronary artery disease.   03/01/2020 Imaging   CT CAP W Contrast  IMPRESSION: 1. Stable exam. No new or progressive findings to suggest recurrent or metastatic disease. 2. Status post right hemicolectomy. 3. Aortic Atherosclerosis (ICD10-I70.0).   09/07/2020 Imaging   CT AP  IMPRESSION: 1. Status post right hemicolectomy, without recurrent or metastatic disease. 2. Coronary artery atherosclerosis. Aortic Atherosclerosis (ICD10-I70.0).   09/24/2021 Imaging   CT CAP  IMPRESSION: 1. Status post partial right hemicolectomy and ileocolic anastomosis. 2. No evidence of recurrent or metastatic disease in the chest, abdomen, or pelvis. 3. Mild, diffuse bilateral bronchial wall thickening, consistent with nonspecific infectious or inflammatory bronchitis. 4. Hepatic steatosis. 5. Prostatomegaly. Thickening of the decompressed urinary bladder, likely secondary to chronic outlet obstruction. 6. Coronary artery disease.   Aortic Atherosclerosis (ICD10-I70.0).     Interval History: Blake Vaughn presents today for headache follow up.  He has been dosing his Elavil 50mg  but not every night, only when he is having headaches.  He has avoided dosing it each night because it has been causing drowsiness extending into the next day.  He had also dosed gabapentin as well which he thinks could have contributed to the drowsiness.  Also doses Naprosyn as needed when he has severe pain.  No changes with the neuropathy.  H+P (06/01/23) Patient presents today to review headache syndrome.  He describes 5 years history of headaches "all over the head, lasting all day, associated with photophobia".  Pain might last for an entire week, but only ~1 week per month on average.  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  Sleep has  been very poor, he only sleeps 5-6 hours per night.  He is tired during the day, isn't aware of any snoring.  Takes tylenol for headaches when needed but this is ineffective.  Medications: Current Outpatient Medications on File Prior to Visit  Medication Sig Dispense Refill   Accu-Chek Softclix Lancets lancets Use lancets to check sugars 2-3 times per day 100 each 12   amitriptyline (ELAVIL) 50 MG tablet Take 1 tablet (50 mg total) by mouth at bedtime. 30 tablet 2   Blood Glucose Monitoring Suppl (ACCU-CHEK GUIDE) w/Device KIT 1 kit by Does not apply route daily. 1 kit 0   empagliflozin (JARDIANCE) 10 MG TABS tablet Take 1 tablet (10 mg total) by mouth daily. 30 tablet 1   glipiZIDE (GLUCOTROL XL) 10 MG 24 hr tablet Take 1 tablet by mouth once daily with breakfast 90 tablet 0   glucose blood (ACCU-CHEK GUIDE) test strip Use strips to check blood sugar 2-3 times per day 100 each 12   levofloxacin (LEVAQUIN) 500 MG tablet Take 1 tablet (500 mg total) by mouth daily. 3 tablet 0   metFORMIN (GLUCOPHAGE) 1000 MG tablet Take 1 tablet (1,000 mg total) by mouth 2 (two) times daily with a meal. 180 tablet 3   naproxen (NAPROSYN) 500 MG tablet Take 1 tablet (500 mg total) by mouth 2 (two) times daily as needed for headache. 60 tablet 1   No current facility-administered medications on file prior to visit.    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   Qualifier: Diagnosis of  By: Arlyce Dice MD, Barbette Hair    GI bleed 11/05/2018    Hypercholesterolemia    Hypertension 11/27/2011   Lymphoma (HCC)    gets annual chemo last tx Feb 2013   TOBACCO USE, QUIT 08/18/2009   Qualifier: Diagnosis of  By: Lanier Prude  MD, Cathrine Muster     Past Surgical History:  Past Surgical History:  Procedure Laterality Date   ESOPHAGOGASTRODUODENOSCOPY (EGD) WITH PROPOFOL N/A 11/05/2018   Procedure: ESOPHAGOGASTRODUODENOSCOPY (EGD) WITH PROPOFOL;  Surgeon: Bernette Redbird, MD;  Location: [facility] ENDOSCOPY;  Service: Endoscopy;  Laterality: N/A;   IR IMAGING GUIDED PORT INSERTION  11/24/2018   IR REMOVAL TUN ACCESS W/ PORT W/O FL MOD SED  11/11/2021   LAPAROSCOPIC APPENDECTOMY N/A 10/21/2018   Procedure: Exploratory laparotomy right hemicolectomy;  Surgeon: Andria Meuse, MD;  Location: [facility] OR;  Service: General;  Laterality: N/A;   Social History:  Social History   Socioeconomic History   Marital status: Married    Spouse name: Not on file   Number of children: 2   Years of education: Not on file   Highest education level: Not on file  Occupational History   Not on file  Tobacco Use   Smoking status: Former    Current packs/day: 0.00    Average packs/day: 0.5 packs/day for 30.0 years (15.0 ttl pk-yrs)    Types: Cigarettes    Start date: 12/08/1974    Quit date: 12/08/2004    Years since quitting: 18.5   Smokeless tobacco: Former    Quit date: 11/20/2006  Vaping Use   Vaping status: Never Used  Substance and Sexual Activity   Alcohol use: No   Drug use: No   Sexual activity: Not on file  Other Topics Concern   Not on file  Social History Narrative   Not on file   Social Determinants of Health   Financial Resource Strain: Not on file  Food Insecurity: Not on file  Transportation Needs: Not on file  Physical Activity: Not on file  Stress: Not on file  Social Connections: Not on file  Intimate Partner Violence: Not on file   Family History:  Family History  Problem Relation Age of Onset   Diabetes Mother    Diabetes Father     Renal Disease Brother    Diabetes Brother    Cancer Brother        lymphoma     Review of Systems: Constitutional: Doesn't report fevers, chills or abnormal weight loss Eyes: Doesn't report blurriness of vision Ears, nose, mouth, throat, and face: Doesn't report sore throat Respiratory: Doesn't report cough, dyspnea or wheezes Cardiovascular: Doesn't report palpitation, chest discomfort  Gastrointestinal:  Doesn't report nausea, constipation, diarrhea GU: Doesn't report incontinence Skin: Doesn't report skin rashes Neurological: Per HPI Musculoskeletal: Doesn't report joint pain Behavioral/Psych: Doesn't report anxiety  Physical Exam: Vitals:   06/29/23 0905  BP: 133/73  Pulse: 73  Resp: 14  Temp: (!) 97.3 F (36.3 C)  SpO2: 100%    KPS: 80. General: Alert, cooperative, pleasant, in no acute distress  Head: Normal EENT: No conjunctival injection or scleral icterus.  Lungs: Resp effort normal Cardiac: Regular rate Abdomen: Non-distended abdomen Skin: No rashes cyanosis or petechiae. Extremities: No clubbing or edema  Neurologic Exam: Mental Status: Awake, alert, attentive to examiner. Oriented to self and environment. Language is fluent with intact comprehension.  Cranial Nerves: Visual acuity is grossly normal. Visual fields are full. Extra-ocular movements intact. No ptosis. Face is symmetric Motor: Tone and bulk are normal. Power is full in both arms and legs. Reflexes are symmetric, no pathologic reflexes present.  Sensory: Impaired in stocking pattern Gait: Sensory dystaxia   Labs: I have reviewed the data as listed    Component Value Date/Time   NA 133 (L) 04/14/2023 0857   NA 140 01/20/2022 0923   NA 133 (L) 09/09/2016 1338   K 3.9 04/14/2023 0857   K 5.1 09/09/2016 1338   CL 103 04/14/2023 0857   CL 102 01/26/2013 0805   CO2 22 04/14/2023 0857   CO2 20 (L) 09/09/2016 1338   GLUCOSE 240 (H) 04/14/2023 0857   GLUCOSE 293 (H) 09/09/2016 1338    GLUCOSE 268 (H) 01/26/2013 0805   BUN 30 (H) 04/14/2023 0857   BUN 20 01/20/2022 0923   BUN 18.9 09/09/2016 1338   CREATININE 0.99 04/14/2023 0857   CREATININE 1.0 09/09/2016 1338   CALCIUM 9.2 04/14/2023 0857   CALCIUM 9.8 09/09/2016 1338   PROT 6.6 04/14/2023 0857   PROT 5.9 (L) 07/16/2017 1502   PROT 6.7 09/09/2016 1338   ALBUMIN 3.8 04/14/2023 0857   ALBUMIN 4.0 07/16/2017 1502   ALBUMIN 3.6 09/09/2016 1338   AST 18 04/14/2023 0857   AST 14 09/09/2016 1338   ALT 20 04/14/2023 0857   ALT 22 09/09/2016 1338   ALKPHOS 62 04/14/2023 0857   ALKPHOS 83 09/09/2016 1338   BILITOT 1.0 04/14/2023 0857   BILITOT 0.40 09/09/2016 1338   GFRNONAA >60 04/14/2023 0857   GFRNONAA >89 04/02/2016 1229   GFRAA >60 09/07/2020 1413   GFRAA >89 04/02/2016 1229   Lab Results  Component Value Date   WBC 6.9 04/14/2023   NEUTROABS 4.5 04/14/2023   HGB 15.7 04/14/2023   HCT 44.5 04/14/2023   MCV 86.1 04/14/2023   PLT 179 04/14/2023    Assessment/Plan Migraine without aura and without status migrainosus, not intractable  Blake Vaughn presents with syndrome consistent with chronic daily headache with migrainous features.  This is likely exacerbated or provoked by chronic sleep issues.    Recommended reducing Elavil to 25mg  HS.  He understands that he need to dose this every night even when he doesn't have any pain.    For acute headaches, recommended naprosyn 500mg  PRN at earliest sign of pain.  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  Main limitations are somatosensory and gait, which are less amenable to neuropathic pain medications.  Will defer gabapentin at this time.  We appreciate the opportunity to participate in the care of Blake Vaughn.   We ask that Blake Vaughn return to clinic in 2 months, or sooner as needed.  All questions were answered. The patient knows to call the clinic with any problems, questions or concerns. No barriers to  learning were detected.  The total time spent in the encounter was 30 minutes and more than 50% was on counseling and review of test results   Henreitta Leber, MD Medical Director of Neuro-Oncology [facility] [place] 06/29/23 8:57 AM

## 2023-07-30 ENCOUNTER — Other Ambulatory Visit: Payer: Self-pay

## 2023-07-30 MED ORDER — EMPAGLIFLOZIN 10 MG PO TABS: 10.0000 mg | ORAL_TABLET | Freq: Every day | ORAL | 1 refills | Status: DC

## 2023-08-01 ENCOUNTER — Encounter: Payer: Self-pay | Admitting: Hematology

## 2023-08-24 ENCOUNTER — Encounter: Payer: Self-pay | Admitting: Hematology

## 2023-08-25 ENCOUNTER — Ambulatory Visit: Payer: Medicare HMO

## 2023-08-26 ENCOUNTER — Other Ambulatory Visit: Payer: Self-pay

## 2023-08-26 DIAGNOSIS — E1165 Type 2 diabetes mellitus with hyperglycemia: Secondary | ICD-10-CM

## 2023-08-26 MED ORDER — GLIPIZIDE ER 10 MG PO TB24: 10.0000 mg | ORAL_TABLET | Freq: Every day | ORAL | 0 refills | Status: DC

## 2023-08-31 ENCOUNTER — Inpatient Hospital Stay: Payer: Self-pay | Attending: Nurse Practitioner | Admitting: Internal Medicine

## 2023-08-31 VITALS — BP 137/75 | HR 74 | Temp 97.9°F | Resp 18 | Ht 62.0 in | Wt 189.5 lb

## 2023-08-31 DIAGNOSIS — Z85038 Personal history of other malignant neoplasm of large intestine: Secondary | ICD-10-CM | POA: Insufficient documentation

## 2023-08-31 DIAGNOSIS — G629 Polyneuropathy, unspecified: Secondary | ICD-10-CM | POA: Insufficient documentation

## 2023-08-31 DIAGNOSIS — G43009 Migraine without aura, not intractable, without status migrainosus: Secondary | ICD-10-CM | POA: Diagnosis not present

## 2023-08-31 MED ORDER — AMITRIPTYLINE HCL 25 MG PO TABS: 25.0000 mg | ORAL_TABLET | Freq: Every day | ORAL | 5 refills | Status: DC

## 2023-08-31 NOTE — Progress Notes (Signed)
[facility] at [facility] [address]  [place], [place] [address] [phone]   Interval Evaluation  Date of Service: 08/31/23 Patient Name: Blake Vaughn Patient MRN: [id number] Patient DOB: 1961/03/29 Provider: Henreitta Leber, MD  Identifying Statement:  Blake Vaughn is a 62 y.o. 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[facility]) Staging form: Colon and Rectum, AJCC 8th Edition - Pathologic stage from 10/21/2018: Stage IVC (pT4b, pN1a, pM1c) - Signed by Malachy Mood, MD on 11/13/2018 Total positive nodes: 1 Histologic grading system: 4 grade system Histologic grade (G): G2 Residual tumor (R): R0 - None Sites of metastasis: Peritoneum Lymph-vascular invasion (LVI): LVI present/identified, NOS  Follicular lymphoma (HCC), History of Staging form: Lymphoid Neoplasms, AJCC 6th Edition - Clinical: Stage II - Signed by Si Gaul, MD on 02/01/2014     Follicular lymphoma [facility]), History of  11/2009 Initial Diagnosis   Follicular lymphoma (HCC), History of    Chemotherapy   1) Status post 6 cycles of systemic chemotherapy with CHOP/Rituxan last dose given 05/01/2009.  2) Maintenance Rituxan at 375 mg per meter square given every 2 months status post 12 cycles    Cancer of right colon (HCC)  10/21/2018 Surgery   Exploratory laparotomy right hemicolectomy by Dr. Cliffton Asters and Dr. Magnus Ivan  10/21/18   10/21/2018 Pathology Results   Diagnosis 10/21/18 Colon, segmental resection for tumor, right ascending and appendix - ADENOCARCINOMA, MODERATE TO POORLY DIFFERENTIATED (4 CM) - METASTATIC CARCINOMA INVOLVING ONE OF EIGHTEEN LYMPH NODES (1/18) - CARCINOMA EXTENDS INTO THE APPENDIX - TWO TUMOR DEPOSITS PRESENT - SEE ONCOLOGY TABLE AND COMMENT BELOW   10/21/2018 Cancer Staging   Staging form: Colon  and Rectum, AJCC 8th Edition - Pathologic stage from 10/21/2018: Stage IVC (pT4b, pN1a, pM1c) - Signed by Malachy Mood, MD on 11/13/2018   11/04/2018 Imaging   CT AP W Contrast 11/04/18  IMPRESSION: 1. Interval appendectomy. There is residual soft tissue thickening and stranding in the right lower quadrant adjacent to the cecum which may represent ongoing inflammation versus postsurgical changes. A small soft tissue density adjacent to the surgical sutures may reflect small hematoma or operative collection. No large focal fluid collection to suggest drainable abscess allowing for absence of contrast 2. Fluid-filled colon without wall thickening, could reflect diarrheal process   11/08/2018 Imaging   CT Chest W Contrast 11/08/18  IMPRESSION: 1. No acute findings are noted in the thorax to account for the patient's symptoms. 2. Aortic atherosclerosis, in addition to left main and 3 vessel coronary artery disease. Please note that although the presence of coronary artery calcium documents the presence of coronary artery disease, the severity of this disease and any potential stenosis cannot be assessed on this non-gated CT examination. Assessment for potential risk factor modification, dietary therapy or pharmacologic therapy may be warranted, if clinically indicated. 3. Additional incidental findings, as above. Aortic Atherosclerosis (ICD10-I70.0).   11/12/2018 Initial Diagnosis   Cancer of right colon (HCC)   11/25/2018 - 05/05/2019 Chemotherapy   FOLFOX q2weeks starting 11/25/18. Due to moderate thrombocytopenia, I will stop 5-FU bolus, and reduce pump infusion to 2200mg /m2 starting with cycle 7.  Plan for last treatment on 05/05/19.    02/10/2019 Imaging   CT AP W Contrast   IMPRESSION: 1. No evidence metastatic disease. 2. Hepatic steatosis. 3. Mildly enlarged prostate. 4. Aortic atherosclerosis (ICD10-170.0).  coronary artery calcification.   09/02/2019 Imaging   CT AP W  Contrast IMPRESSION: 1. No signs to suggest metastatic disease in the abdomen or pelvis. 2. Aortic atherosclerosis these, as well as right coronary artery disease.   03/01/2020 Imaging   CT CAP W Contrast  IMPRESSION: 1. Stable exam. No new or progressive findings to suggest recurrent or metastatic disease. 2. Status post right hemicolectomy. 3. Aortic Atherosclerosis (ICD10-I70.0).   09/07/2020 Imaging   CT AP  IMPRESSION: 1. Status post right hemicolectomy, without recurrent or metastatic disease. 2. Coronary artery atherosclerosis. Aortic Atherosclerosis (ICD10-I70.0).   09/24/2021 Imaging   CT CAP  IMPRESSION: 1. Status post partial right hemicolectomy and ileocolic anastomosis. 2. No evidence of recurrent or metastatic disease in the chest, abdomen, or pelvis. 3. Mild, diffuse bilateral bronchial wall thickening, consistent with nonspecific infectious or inflammatory bronchitis. 4. Hepatic steatosis. 5. Prostatomegaly. Thickening of the decompressed urinary bladder, likely secondary to chronic outlet obstruction. 6. Coronary artery disease.   Aortic Atherosclerosis (ICD10-I70.0).     Interval History: Blake Vaughn presents today for headache follow up.  His headaches have improved significantly.  He endorses fewer events and shorter duration.  Has  been dosing the Elavil 25mg  at night without the issues he had with the 50mg  dose.  Also doses Naprosyn as needed when he has severe pain.  No changes with the neuropathy.  H+P (06/01/23) Patient presents today to review headache syndrome.  He describes 5 years history of headaches "all over the head, lasting all day, associated with photophobia".  Pain might last for an entire week, but only ~1 week per month on average.  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  Sleep has  been very poor, he only sleeps 5-6 hours per night.  He is  tired during the day, isn't aware of any snoring.  Takes tylenol for headaches when needed but this is ineffective.  Medications: Current Outpatient Medications on File Prior to Visit  Medication Sig Dispense Refill   Accu-Chek Softclix Lancets lancets Use lancets to check sugars 2-3 times per day 100 each 12   amitriptyline (ELAVIL) 25 MG tablet Take 1 tablet (25 mg total) by mouth at bedtime. 60 tablet 1   Blood Glucose Monitoring Suppl (ACCU-CHEK GUIDE) w/Device KIT 1 kit by Does not apply route daily. (Patient not taking: Reported on 06/29/2023) 1 kit 0   empagliflozin (JARDIANCE) 10 MG TABS tablet Take 1 tablet (10 mg total) by mouth daily. 30 tablet 1   glipiZIDE (GLUCOTROL XL) 10 MG 24 hr tablet Take 1 tablet (10 mg total) by mouth daily with breakfast. 90 tablet 0   glucose blood (ACCU-CHEK GUIDE) test strip Use strips to check blood sugar 2-3 times per day 100 each 12   levofloxacin (LEVAQUIN) 500 MG tablet Take 1 tablet (500 mg total) by mouth daily. 3 tablet 0   metFORMIN (GLUCOPHAGE) 1000 MG tablet Take 1 tablet (1,000 mg total) by mouth 2 (two) times daily with a meal. 180 tablet 3   naproxen (NAPROSYN) 500 MG tablet Take 1 tablet (500 mg total) by mouth 2 (two) times daily as needed for headache. 60 tablet 1   No current facility-administered medications on file prior to visit.    Allergies:  Allergies  Allergen Reactions   Iohexol Other (See Comments)     Code: HIVES, Desc: pt had itching 8 hrs after 6/10 scan;and now was today 08/16/09  was given 50mg  benadryl 1 hr before scan and he did fine.Amy Rogal, Onset Date: [id number] *PLEASE HAVE PT TAKE 50MG  OF BENADRYL 1HR PRIOR TO SCAN*    Past Medical History:  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   Qualifier: Diagnosis of  By: Arlyce Dice MD, Barbette Hair    GI bleed 11/05/2018   Hypercholesterolemia    Hypertension 11/27/2011   Lymphoma  (HCC)    gets annual chemo last tx Feb 2013   TOBACCO USE, QUIT 08/18/2009   Qualifier: Diagnosis of  By: Lanier Prude  MD, Cathrine Muster     Past Surgical History:  Past Surgical History:  Procedure Laterality Date   ESOPHAGOGASTRODUODENOSCOPY (EGD) WITH PROPOFOL N/A 11/05/2018   Procedure: ESOPHAGOGASTRODUODENOSCOPY (EGD) WITH PROPOFOL;  Surgeon: Bernette Redbird, MD;  Location: [facility] ENDOSCOPY;  Service: Endoscopy;  Laterality: N/A;   IR IMAGING GUIDED PORT INSERTION  11/24/2018   IR REMOVAL TUN ACCESS W/ PORT W/O FL MOD SED  11/11/2021   LAPAROSCOPIC APPENDECTOMY N/A 10/21/2018   Procedure: Exploratory laparotomy right hemicolectomy;  Surgeon: Andria Meuse, MD;  Location: [facility] OR;  Service: General;  Laterality: N/A;   Social History:  Social History   Socioeconomic History   Marital status: Married    Spouse name: Not on file   Number of children: 2   Years of education: Not on file   Highest education level: Not on file  Occupational History   Not on file  Tobacco Use   Smoking status: Former    Current packs/day: 0.00    Average packs/day: 0.5 packs/day for 30.0 years (15.0 ttl pk-yrs)    Types: Cigarettes    Start date: 12/08/1974    Quit date: 12/08/2004    Years since quitting: 18.7   Smokeless tobacco: Former    Quit date: 11/20/2006  Vaping Use   Vaping status: Never Used  Substance and Sexual Activity   Alcohol use: No   Drug use: No   Sexual activity: Not on file  Other Topics Concern   Not on file  Social History Narrative   Not on file   Social Determinants of Health   Financial Resource Strain: Not on file  Food Insecurity: Not on file  Transportation Needs: Not on file  Physical Activity: Not on file  Stress: Not on file  Social Connections: Not on file  Intimate Partner Violence: Not on file   Family History:  Family History  Problem Relation Age of Onset   Diabetes Mother    Diabetes Father    Renal Disease Brother    Diabetes Brother    Cancer  Brother        lymphoma     Review of Systems: Constitutional: Doesn't report fevers, chills or abnormal weight loss Eyes: Doesn't report blurriness of vision Ears, nose, mouth, throat, and face: Doesn't report sore throat Respiratory: Doesn't report cough, dyspnea or wheezes Cardiovascular: Doesn't report palpitation, chest discomfort  Gastrointestinal:  Doesn't report nausea, constipation, diarrhea GU: Doesn't report incontinence Skin: Doesn't report skin rashes Neurological: Per HPI Musculoskeletal: Doesn't report joint pain Behavioral/Psych: Doesn't report anxiety  Physical Exam: There were no vitals filed for this visit.   KPS: 80. General: Alert, cooperative, pleasant, in no acute distress Head: Normal EENT: No conjunctival injection or scleral icterus.  Lungs: Resp effort normal Cardiac: Regular rate Abdomen: Non-distended abdomen Skin: No rashes cyanosis or petechiae. Extremities: No clubbing or  edema  Neurologic Exam: Mental Status: Awake, alert, attentive to examiner. Oriented to self and environment. Language is fluent with intact comprehension.  Cranial Nerves: Visual acuity is grossly normal. Visual fields are full. Extra-ocular movements intact. No ptosis. Face is symmetric Motor: Tone and bulk are normal. Power is full in both arms and legs. Reflexes are symmetric, no pathologic reflexes present.  Sensory: Impaired in stocking pattern Gait: Sensory dystaxia   Labs: I have reviewed the data as listed    Component Value Date/Time   NA 133 (L) 04/14/2023 0857   NA 140 01/20/2022 0923   NA 133 (L) 09/09/2016 1338   K 3.9 04/14/2023 0857   K 5.1 09/09/2016 1338   CL 103 04/14/2023 0857   CL 102 01/26/2013 0805   CO2 22 04/14/2023 0857   CO2 20 (L) 09/09/2016 1338   GLUCOSE 240 (H) 04/14/2023 0857   GLUCOSE 293 (H) 09/09/2016 1338   GLUCOSE 268 (H) 01/26/2013 0805   BUN 30 (H) 04/14/2023 0857   BUN 20 01/20/2022 0923   BUN 18.9 09/09/2016 1338    CREATININE 0.99 04/14/2023 0857   CREATININE 1.0 09/09/2016 1338   CALCIUM 9.2 04/14/2023 0857   CALCIUM 9.8 09/09/2016 1338   PROT 6.6 04/14/2023 0857   PROT 5.9 (L) 07/16/2017 1502   PROT 6.7 09/09/2016 1338   ALBUMIN 3.8 04/14/2023 0857   ALBUMIN 4.0 07/16/2017 1502   ALBUMIN 3.6 09/09/2016 1338   AST 18 04/14/2023 0857   AST 14 09/09/2016 1338   ALT 20 04/14/2023 0857   ALT 22 09/09/2016 1338   ALKPHOS 62 04/14/2023 0857   ALKPHOS 83 09/09/2016 1338   BILITOT 1.0 04/14/2023 0857   BILITOT 0.40 09/09/2016 1338   GFRNONAA >60 04/14/2023 0857   GFRNONAA >89 04/02/2016 1229   GFRAA >60 09/07/2020 1413   GFRAA >89 04/02/2016 1229   Lab Results  Component Value Date   WBC 6.9 04/14/2023   NEUTROABS 4.5 04/14/2023   HGB 15.7 04/14/2023   HCT 44.5 04/14/2023   MCV 86.1 04/14/2023   PLT 179 04/14/2023    Assessment/Plan Migraine without aura and without status migrainosus, not intractable  Blake Vaughn presents with syndrome consistent with chronic daily headache with migrainous features.  This is likely exacerbated or provoked by chronic sleep issues.    He is clinically stable today.  Recommended continuing Elavil 25mg  HS.  He understands that he need to dose this every night even when he doesn't have any pain.    For acute headaches, recommended naprosyn 500mg  PRN at earliest sign of pain.  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  Main limitations are somatosensory and gait, which are less amenable to neuropathic pain medications.  Will defer gabapentin at this time.  We appreciate the opportunity to participate in the care of Blake Vaughn.   We ask that Blake Vaughn return to clinic as needed with recurrence of headaches.  All questions were answered. The patient knows to call the clinic with any problems, questions or concerns. No barriers to learning were detected.  The total time spent in the encounter was 30 minutes  and more than 50% was on counseling and review of test results   Henreitta Leber, MD Medical Director of Neuro-Oncology [facility] at [place] 08/31/23 8:56 AM

## 2023-09-02 ENCOUNTER — Encounter: Payer: Self-pay | Admitting: Hematology

## 2023-09-09 ENCOUNTER — Ambulatory Visit (HOSPITAL_COMMUNITY)
Admission: EM | Admit: 2023-09-09 | Discharge: 2023-09-09 | Disposition: A | Discharge status: Home / Self Care | Payer: Medicare HMO | Attending: Emergency Medicine | Admitting: Emergency Medicine

## 2023-09-09 ENCOUNTER — Encounter (HOSPITAL_COMMUNITY): Payer: Self-pay | Admitting: Emergency Medicine

## 2023-09-09 DIAGNOSIS — M546 Pain in thoracic spine: Secondary | ICD-10-CM

## 2023-09-09 MED ORDER — KETOROLAC TROMETHAMINE 30 MG/ML IJ SOLN
INTRAMUSCULAR | Status: AC
  Filled 2023-09-09: qty 1

## 2023-09-09 MED ORDER — KETOROLAC TROMETHAMINE 60 MG/2ML IM SOLN
30.0000 mg | Freq: Once | INTRAMUSCULAR | Status: AC
  Administered 2023-09-09: 30 mg via INTRAMUSCULAR

## 2023-09-09 NOTE — Discharge Instructions (Addendum)
Hoy le hemos administrado una inyeccin de Toradol en la clnica para ayudarle a Human resources officer. Tambin puede probar con compresas tibias y estiramientos suaves. Puede tomar 500 mg de Tylenol cada 8 horas segn sea necesario y continuar tomando su medicacin para la diabetes segn lo prescrito.  Vuelva a la clnica si no mejora el dolor durante la prxima semana o si desarrolla sntomas nuevos o preocupantes. Busque atencin inmediata en el servicio de urgencias ms cercano si presenta fiebre, dificultad para respirar o sntomas de emergencia.  We have given you a Toradol injection today in clinic to help manage your pain.  You can also try warm compresses and gentle stretching.  You can take 500 mg of Tylenol every 8 hours as needed, continue to take your diabetes medication as prescribed.  Return to clinic if no improvement over your pain over the next week, or you develop any new or concerning symptoms.  Seek immediate care at the nearest emergency department if you develop any fever, shortness of breath, or emergent symptoms.

## 2023-09-09 NOTE — ED Provider Notes (Signed)
[facility]    CSN: [id number] Arrival date & time: 09/09/23  1910      History   Chief Complaint No chief complaint on file.   HPI Blake Vaughn is a 62 y.o. male.   Patient presents to clinic for complaints of thoracic back pain that has been ongoing for the past week.  He denies neck pain, denies cough, denies fever, sore throat, nasal congestion or rhinorrhea.  Tylenol helps with his pain.  Denies pain increasing with bending or twisting.  No recent falls or trauma.  No recent sick contacts.  Overall he has had a normal appetite.  Denies any dysuria.  No weakness, numbness or tingling.   He is concerned that the pain is his lungs.   Medical interpreter used for this visit.   The history is provided by the patient and medical records. The history is limited by a language barrier. A language interpreter was used.    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   Qualifier: Diagnosis of  By: Arlyce Dice MD, Barbette Hair    GI bleed 11/05/2018   Hypercholesterolemia    Hypertension 11/27/2011   Lymphoma (HCC)    gets annual chemo last tx Feb 2013   TOBACCO USE, QUIT 08/18/2009   Qualifier: Diagnosis of  By: Lanier Prude  MD, Taineisha      Patient Active Problem List   Diagnosis Date Noted   Penetrating wound of right foot 06/02/2023   Migraine headache without aura 06/01/2023   Gastroesophageal reflux disease without esophagitis 04/02/2023   Blurred vision 04/30/2021   Microalbuminuria 06/05/2020   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 12/24/2019   Neuropathy 07/30/2019   Port-A-Cath in place 11/25/2018   Cancer of right colon (HCC) 11/12/2018   Syncope due to orthostatic hypotension 11/05/2018   Hypoalbuminemia 11/05/2018   Hypoproteinemia (HCC) 11/05/2018   Uses Spanish as primary spoken language 10/23/2018   Obesity (BMI 30-39.9) 10/23/2018   Mass of  cecum s/p right colectomy 10/21/2018 10/22/2018   Nocturnal leg cramps 07/16/2017   HTN (hypertension) 11/27/2011   Chronic nonintractable headache 10/16/2011   Pure hypercholesterolemia 09/24/2009   ERECTILE DYSFUNCTION, 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   Type 2 diabetes mellitus with hyperglycemia (HCC) 07/09/2009   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/09/2009   Follicular lymphoma (HCC), 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    Past Surgical History:  Procedure Laterality Date   ESOPHAGOGASTRODUODENOSCOPY (EGD) WITH PROPOFOL N/A 11/05/2018   Procedure: ESOPHAGOGASTRODUODENOSCOPY (EGD) WITH PROPOFOL;  Surgeon: Bernette Redbird, MD;  Location: [facility] ENDOSCOPY;  Service: Endoscopy;  Laterality: N/A;   IR IMAGING GUIDED PORT INSERTION  11/24/2018   IR REMOVAL TUN ACCESS W/ PORT W/O FL MOD SED  11/11/2021   LAPAROSCOPIC APPENDECTOMY N/A 10/21/2018   Procedure: Exploratory laparotomy right hemicolectomy;  Surgeon: Andria Meuse, MD;  Location: [facility] OR;  Service: General;  Laterality: N/A;       Home Medications    Prior to Admission medications   Medication Sig Start Date End Date Taking? Authorizing Provider  Accu-Chek Softclix Lancets lancets Use lancets to check sugars 2-3 times per day 12/11/22   Lilland, Alana, DO  amitriptyline (ELAVIL) 25 MG tablet Take 1 tablet (25 mg total) by mouth at bedtime. 08/31/23   Henreitta Leber, MD  Blood Glucose Monitoring Suppl (ACCU-CHEK GUIDE) w/Device KIT 1 kit by Does not apply route daily. Patient not taking: Reported on 06/29/2023 12/05/22  Lilland, Alana, DO  empagliflozin (JARDIANCE) 10 MG TABS tablet Take 1 tablet (10 mg total) by mouth daily. 07/30/23   Glendale Chard, DO  glipiZIDE (GLUCOTROL XL) 10 MG 24 hr tablet Take 1 tablet (10 mg total) by mouth daily with breakfast. 08/26/23   Glendale Chard, DO  glucose blood (ACCU-CHEK GUIDE) test strip Use strips to check blood sugar 2-3 times per day 12/11/22   Lilland, Alana, DO  levofloxacin (LEVAQUIN) 500 MG tablet Take 1 tablet (500 mg  total) by mouth daily. 06/02/23   Alicia Amel, MD  metFORMIN (GLUCOPHAGE) 1000 MG tablet Take 1 tablet (1,000 mg total) by mouth 2 (two) times daily with a meal. 07/11/22   Lilland, Alana, DO  naproxen (NAPROSYN) 500 MG tablet Take 1 tablet (500 mg total) by mouth 2 (two) times daily as needed for headache. 06/01/23   Henreitta Leber, MD    Family History Family History  Problem Relation Age of Onset   Diabetes Mother    Diabetes Father    Renal Disease Brother    Diabetes Brother    Cancer Brother        lymphoma     Social History Social History   Tobacco Use   Smoking status: Former    Current packs/day: 0.00    Average packs/day: 0.5 packs/day for 30.0 years (15.0 ttl pk-yrs)    Types: Cigarettes    Start date: 12/08/1974    Quit date: 12/08/2004    Years since quitting: 18.7   Smokeless tobacco: Former    Quit date: 11/20/2006  Vaping Use   Vaping status: Never Used  Substance Use Topics   Alcohol use: No   Drug use: No     Allergies   Iohexol   Review of Systems Review of Systems  Constitutional:  Negative for fatigue and fever.  HENT:  Negative for congestion, postnasal drip, rhinorrhea and sore throat.   Respiratory:  Negative for cough, shortness of breath and wheezing.   Cardiovascular:  Negative for chest pain.  Gastrointestinal:  Negative for abdominal pain.  Genitourinary:  Negative for dysuria.  Musculoskeletal:  Positive for back pain. Negative for gait problem.  Neurological:  Negative for weakness and numbness.     Physical Exam Triage Vital Signs ED Triage Vitals [09/09/23 1949]  Encounter Vitals Group     BP (!) 151/97     Systolic BP Percentile      Diastolic BP Percentile      Pulse Rate 89     Resp 16     Temp 98.4 F (36.9 C)     Temp Source Oral     SpO2 94 %     Weight      Height      Head Circumference      Peak Flow      Pain Score      Pain Loc      Pain Education      Exclude from Growth Chart    No data  found.  Updated Vital Signs BP (!) 151/97 (BP Location: Right Arm)   Pulse 89   Temp 98.4 F (36.9 C) (Oral)   Resp 16   SpO2 94%   Visual Acuity Right Eye Distance:   Left Eye Distance:   Bilateral Distance:    Right Eye Near:   Left Eye Near:    Bilateral Near:     Physical Exam Vitals and nursing note reviewed.  Constitutional:  Appearance: Normal appearance.  HENT:     Head: Normocephalic and atraumatic.     Right Ear: External ear normal.     Left Ear: External ear normal.     Nose: Nose normal.     Mouth/Throat:     Mouth: Mucous membranes are moist.  Eyes:     Conjunctiva/sclera: Conjunctivae normal.  Cardiovascular:     Rate and Rhythm: Normal rate and regular rhythm.     Heart sounds: Normal heart sounds. No murmur heard. Pulmonary:     Effort: Pulmonary effort is normal. No respiratory distress.     Breath sounds: Normal breath sounds.  Abdominal:     Tenderness: There is no right CVA tenderness or left CVA tenderness.  Musculoskeletal:        General: Normal range of motion.     Cervical back: Normal.     Thoracic back: Normal.     Lumbar back: Normal.       Back:     Comments: Bilateral thoracic back pain that is nontender to palpation.  Spine without step-off or deformity.  Skin:    General: Skin is warm and dry.  Neurological:     General: No focal deficit present.     Mental Status: He is alert and oriented to person, place, and time.  Psychiatric:        Mood and Affect: Mood normal.        Behavior: Behavior normal. Behavior is cooperative.      UC Treatments / Results  Labs (all labs ordered are listed, but only abnormal results are displayed) Labs Reviewed - No data to display  EKG   Radiology No results found.  Procedures Procedures (including critical care time)  Medications Ordered in UC Medications  ketorolac (TORADOL) injection 30 mg (has no administration in time range)    Initial Impression / Assessment and  Plan / UC Course  I have reviewed the triage vital signs and the nursing notes.  Pertinent labs & imaging results that were available during my care of the patient were reviewed by me and considered in my medical decision making (see chart for details).  Vitals and triage reviewed, patient is hemodynamically stable. Lungs are vesicular, oxygenation 97% RA.  Bilateral thoracic back pain that is nontender to palpation.  Suspect musculoskeletal etiology, given IM Toradol injection in clinic and advised to continue with Tylenol.  Plan of care, follow-up care and emergency precautions discussed, no questions at this time.     Final Clinical Impressions(s) / UC Diagnoses   Final diagnoses:  Acute bilateral thoracic back pain     Discharge Instructions      Hoy le hemos administrado una inyeccin de Toradol en la clnica para ayudarle a Human resources officer. Tambin puede probar con compresas tibias y estiramientos suaves. Puede tomar 500 mg de Tylenol cada 8 horas segn sea necesario y continuar tomando su medicacin para la diabetes segn lo prescrito.  Vuelva a la clnica si no mejora el dolor durante la prxima semana o si desarrolla sntomas nuevos o preocupantes. Busque atencin inmediata en el servicio de urgencias ms cercano si presenta fiebre, dificultad para respirar o sntomas de emergencia.  We have given you a Toradol injection today in clinic to help manage your pain.  You can also try warm compresses and gentle stretching.  You can take 500 mg of Tylenol every 8 hours as needed, continue to take your diabetes medication as prescribed.  Return to clinic if no  improvement over your pain over the next week, or you develop any new or concerning symptoms.  Seek immediate care at the nearest emergency department if you develop any fever, shortness of breath, or emergent symptoms.      ED Prescriptions   None    PDMP not reviewed this encounter.   Zebulun Deman, [place] N,  [place] 09/09/23 2018

## 2023-09-09 NOTE — ED Triage Notes (Signed)
Bilateral back pain, mid back, x 1 week. "I feel like it's in my lungs." Reports he's been having a wet cough with this. Taking tylenol to help symptoms. Denies other URI symptoms, runny nose, nasal congestion, sore throat. No sick contacts. Denies any changes to his urine.

## 2023-09-11 ENCOUNTER — Encounter (HOSPITAL_COMMUNITY): Payer: Self-pay

## 2023-09-11 ENCOUNTER — Other Ambulatory Visit: Payer: Self-pay

## 2023-09-11 ENCOUNTER — Emergency Department (HOSPITAL_COMMUNITY): Payer: Medicare HMO

## 2023-09-11 ENCOUNTER — Emergency Department (HOSPITAL_COMMUNITY)
Admission: EM | Admit: 2023-09-11 | Discharge: 2023-09-11 | Disposition: A | Discharge status: Home / Self Care | Payer: Medicare HMO | Attending: Emergency Medicine | Admitting: Emergency Medicine

## 2023-09-11 DIAGNOSIS — R1013 Epigastric pain: Secondary | ICD-10-CM | POA: Diagnosis not present

## 2023-09-11 DIAGNOSIS — I10 Essential (primary) hypertension: Secondary | ICD-10-CM | POA: Diagnosis not present

## 2023-09-11 DIAGNOSIS — Z7984 Long term (current) use of oral hypoglycemic drugs: Secondary | ICD-10-CM | POA: Diagnosis not present

## 2023-09-11 DIAGNOSIS — C182 Malignant neoplasm of ascending colon: Secondary | ICD-10-CM | POA: Diagnosis not present

## 2023-09-11 DIAGNOSIS — N400 Enlarged prostate without lower urinary tract symptoms: Secondary | ICD-10-CM | POA: Diagnosis not present

## 2023-09-11 DIAGNOSIS — N281 Cyst of kidney, acquired: Secondary | ICD-10-CM | POA: Diagnosis not present

## 2023-09-11 DIAGNOSIS — E119 Type 2 diabetes mellitus without complications: Secondary | ICD-10-CM | POA: Insufficient documentation

## 2023-09-11 DIAGNOSIS — Z79899 Other long term (current) drug therapy: Secondary | ICD-10-CM | POA: Insufficient documentation

## 2023-09-11 DIAGNOSIS — Z8572 Personal history of non-Hodgkin lymphomas: Secondary | ICD-10-CM | POA: Diagnosis not present

## 2023-09-11 DIAGNOSIS — Z85038 Personal history of other malignant neoplasm of large intestine: Secondary | ICD-10-CM | POA: Insufficient documentation

## 2023-09-11 DIAGNOSIS — R109 Unspecified abdominal pain: Secondary | ICD-10-CM | POA: Diagnosis not present

## 2023-09-11 LAB — COMPREHENSIVE METABOLIC PANEL
ALT: 19 U/L (ref 0–44)
AST: 20 U/L (ref 15–41)
Albumin: 3.7 g/dL (ref 3.5–5.0)
Alkaline Phosphatase: 61 U/L (ref 38–126)
Anion gap: 6 (ref 5–15)
BUN: 21 mg/dL (ref 8–23)
CO2: 22 mmol/L (ref 22–32)
Calcium: 8.5 mg/dL — ABNORMAL LOW (ref 8.9–10.3)
Chloride: 105 mmol/L (ref 98–111)
Creatinine, Ser: 0.91 mg/dL (ref 0.61–1.24)
GFR, Estimated: >60 mL/min (ref >60)
Glucose, Bld: 232 mg/dL — ABNORMAL HIGH (ref 70–99)
Potassium: 4.1 mmol/L (ref 3.5–5.1)
Sodium: 133 mmol/L — ABNORMAL LOW (ref 135–145)
Total Bilirubin: 0.6 mg/dL (ref 0.3–1.2)
Total Protein: 6.3 g/dL — ABNORMAL LOW (ref 6.5–8.1)

## 2023-09-11 LAB — URINALYSIS, ROUTINE W REFLEX MICROSCOPIC
Bacteria, UA: NONE SEEN
Bilirubin Urine: NEGATIVE
Glucose, UA: >=500 mg/dL — AB
Ketones, ur: NEGATIVE mg/dL
Leukocytes,Ua: NEGATIVE
Nitrite: NEGATIVE
Protein, ur: 30 mg/dL — AB
Specific Gravity, Urine: 1.026 (ref 1.005–1.030)
pH: 5 (ref 5.0–8.0)

## 2023-09-11 LAB — CBC
HCT: 45.1 % (ref 39.0–52.0)
Hemoglobin: 15.1 g/dL (ref 13.0–17.0)
MCH: 30 pg (ref 26.0–34.0)
MCHC: 33.5 g/dL (ref 30.0–36.0)
MCV: 89.5 fL (ref 80.0–100.0)
Platelets: 177 10*3/uL (ref 150–400)
RBC: 5.04 MIL/uL (ref 4.22–5.81)
RDW: 13.2 % (ref 11.5–15.5)
WBC: 7.3 10*3/uL (ref 4.0–10.5)
nRBC: 0 % (ref 0.0–0.2)

## 2023-09-11 LAB — LIPASE, BLOOD: Lipase: 36 U/L (ref 11–51)

## 2023-09-11 MED ORDER — PANTOPRAZOLE SODIUM 20 MG PO TBEC: 20.0000 mg | DELAYED_RELEASE_TABLET | Freq: Every day | ORAL | 0 refills | Status: DC

## 2023-09-11 MED ORDER — ALUM & MAG HYDROXIDE-SIMETH 200-200-20 MG/5ML PO SUSP
30.0000 mL | Freq: Once | ORAL | Status: AC
  Administered 2023-09-11: 30 mL via ORAL
  Filled 2023-09-11: qty 30

## 2023-09-11 MED ORDER — MORPHINE SULFATE (PF) 4 MG/ML IV SOLN
4.0000 mg | Freq: Once | INTRAVENOUS | Status: AC
  Administered 2023-09-11: 4 mg via INTRAVENOUS
  Filled 2023-09-11: qty 1

## 2023-09-11 NOTE — ED Provider Notes (Signed)
[place] Provider Note   CSN: [id number] Arrival date & time: 09/11/23  8469     History  Chief Complaint  Patient presents with   Abdominal Pain    Blake Vaughn is a 62 y.o. male.  The history is provided by the patient and medical records. The history is limited by a language barrier. A language interpreter was used.  Abdominal Pain    62 year old Hispanic speaking male with significant history of diabetes, hypertension, hyperlipoidemia, lymphoma, colon cancer, presenting with complaint of abdominal pain.  History obtained using language interpreter.  Patient report for the past 3 days he has had intermittent mid abdominal pain.  Pain is described as a sharp sensation radiates towards his back.  Pain is moderate intensity not associate with eating or drinking.  No fever or chills no chest pain or shortness of breath or productive cough no nausea vomiting diarrhea last bowel movement was this morning and was normal no urinary problem no blood in urine.  He mention he has had similar symptoms like this in the past and was told that he had a hernia.  At home he is trying taking Tylenol for his symptoms without relief.  His oncologist is Dr. Mosetta Putt  Home Medications Prior to Admission medications   Medication Sig Start Date End Date Taking? Authorizing Provider  Accu-Chek Softclix Lancets lancets Use lancets to check sugars 2-3 times per day 12/11/22   Lilland, Alana, DO  amitriptyline (ELAVIL) 25 MG tablet Take 1 tablet (25 mg total) by mouth at bedtime. 08/31/23   Henreitta Leber, MD  Blood Glucose Monitoring Suppl (ACCU-CHEK GUIDE) w/Device KIT 1 kit by Does not apply route daily. Patient not taking: Reported on 06/29/2023 12/05/22   Lilland, Percival Spanish, DO  empagliflozin (JARDIANCE) 10 MG TABS tablet Take 1 tablet (10 mg total) by mouth daily. 07/30/23   Glendale Chard, DO  glipiZIDE (GLUCOTROL XL) 10 MG 24 hr tablet Take 1 tablet (10 mg  total) by mouth daily with breakfast. 08/26/23   Glendale Chard, DO  glucose blood (ACCU-CHEK GUIDE) test strip Use strips to check blood sugar 2-3 times per day 12/11/22   Lilland, Alana, DO  levofloxacin (LEVAQUIN) 500 MG tablet Take 1 tablet (500 mg total) by mouth daily. 06/02/23   Alicia Amel, MD  metFORMIN (GLUCOPHAGE) 1000 MG tablet Take 1 tablet (1,000 mg total) by mouth 2 (two) times daily with a meal. 07/11/22   Lilland, Alana, DO  naproxen (NAPROSYN) 500 MG tablet Take 1 tablet (500 mg total) by mouth 2 (two) times daily as needed for headache. 06/01/23   Henreitta Leber, MD      Allergies    Iohexol    Review of Systems   Review of Systems  Gastrointestinal:  Positive for abdominal pain.  All other systems reviewed and are negative.   Physical Exam Updated Vital Signs BP (!) 153/79 (BP Location: Left Arm)   Pulse 76   Temp 97.7 F (36.5 C) (Oral)   Resp 16   Ht 5\' 2"  (1.575 m)   Wt 83.9 kg   SpO2 99%   BMI 33.84 kg/m  Physical Exam Vitals and nursing note reviewed.  Constitutional:      General: He is not in acute distress.    Appearance: He is well-developed.  HENT:     Head: Atraumatic.  Eyes:     Conjunctiva/sclera: Conjunctivae normal.  Cardiovascular:     Rate and Rhythm: Normal  rate and regular rhythm.     Pulses: Normal pulses.     Heart sounds: Normal heart sounds.  Pulmonary:     Effort: Pulmonary effort is normal.     Breath sounds: Normal breath sounds.  Abdominal:     General: Bowel sounds are normal.     Tenderness: There is abdominal tenderness (Abdomen is distended, tenderness to mid abdomen on palpation no guarding no rebound tenderness no hernia noted.).  Musculoskeletal:     Cervical back: Neck supple.  Skin:    Findings: No rash.  Neurological:     Mental Status: He is alert.     ED Results / Procedures / Treatments   Labs (all labs ordered are listed, but only abnormal results are displayed) Labs Reviewed  COMPREHENSIVE  METABOLIC PANEL - Abnormal; Notable for the following components:      Result Value   Sodium 133 (*)    Glucose, Bld 232 (*)    Calcium 8.5 (*)    Total Protein 6.3 (*)    All other components within normal limits  URINALYSIS, ROUTINE W REFLEX MICROSCOPIC - Abnormal; Notable for the following components:   Color, Urine STRAW (*)    Glucose, UA >=500 (*)    Hgb urine dipstick SMALL (*)    Protein, ur 30 (*)    All other components within normal limits  LIPASE, BLOOD  CBC    EKG None  Radiology CT ABDOMEN PELVIS WO CONTRAST  Result Date: 09/11/2023 CLINICAL DATA:  Abdominal pain, acute, nonlocalized. History of right colon carcinoma. * Tracking Code: BO * EXAM: CT ABDOMEN AND PELVIS WITHOUT CONTRAST TECHNIQUE: Multidetector CT imaging of the abdomen and pelvis was performed following the standard protocol without IV contrast. RADIATION DOSE REDUCTION: This exam was performed according to the departmental dose-optimization program which includes automated exposure control, adjustment of the mA and/or kV according to patient size and/or use of iterative reconstruction technique. COMPARISON:  CT scan abdomen and pelvis from 04/20/2023. FINDINGS: Lower chest: There is a stable 6 mm calcified granuloma in the middle lobe. The lung bases are otherwise clear. No pleural effusion. The heart is normal in size. No pericardial effusion. Hepatobiliary: The liver is normal in size. Non-cirrhotic configuration. No suspicious mass. No intrahepatic or extrahepatic bile duct dilation. No calcified gallstones. Normal gallbladder wall thickness. No pericholecystic inflammatory changes. Pancreas: Unremarkable. No pancreatic ductal dilatation or surrounding inflammatory changes. Spleen: Within normal limits. No focal lesion. Adrenals/Urinary Tract: Adrenal glands are unremarkable. No suspicious renal mass. There is a stable nearly completely exophytic 1.8 x 2.0 cm cyst arising from the left kidney upper pole,  medially. No hydronephrosis. No renal or ureteric calculi. Unremarkable urinary bladder. Stomach/Bowel: Stomach is distended with ingested food/fluid. Patient is status post right hemicolectomy with ileocolonic side-to-side anastomosis in the right upper quadrant. No disproportionate dilation of the small or large bowel loops. No evidence of abnormal bowel wall thickening or inflammatory changes. Vascular/Lymphatic: No ascites or pneumoperitoneum. No abdominal or pelvic lymphadenopathy, by size criteria. No aneurysmal dilation of the major abdominal arteries. There are minimal peripheral atherosclerotic vascular calcifications of the aorta and its major branches. Reproductive: Enlarged prostate. Symmetric seminal vesicles. Other: Infraumbilical midline surgical scar noted. The soft tissues and abdominal wall are otherwise unremarkable. Musculoskeletal: No suspicious osseous lesions. There are mild multilevel degenerative changes in the visualized spine, most pronounced at L5-S1 level. IMPRESSION: 1. No acute inflammatory process identified within the abdomen or pelvis. 2. Status post right hemicolectomy with ileocolonic side-to-side  anastomosis in the right upper quadrant. No evidence of recurrent or metastatic disease. 3. Enlarged prostate. 4. Multiple other nonacute observations, as described above. Aortic Atherosclerosis (ICD10-I70.0). Electronically Signed   By: Jules Schick M.D.   On: 09/11/2023 11:17    Procedures Procedures    Medications Ordered in ED Medications  morphine (PF) 4 MG/ML injection 4 mg (4 mg Intravenous Given 09/11/23 0954)  alum & mag hydroxide-simeth (MAALOX/MYLANTA) 200-200-20 MG/5ML suspension 30 mL (30 mLs Oral Given 09/11/23 1238)    ED Course/ Medical Decision Making/ A&P                                 Medical Decision Making Amount and/or Complexity of Data Reviewed Labs: ordered. Radiology: ordered.  Risk OTC drugs. Prescription drug management.   BP (!)  153/79 (BP Location: Left Arm)   Pulse 76   Temp 97.7 F (36.5 C) (Oral)   Resp 16   Ht 5\' 2"  (1.575 m)   Wt 83.9 kg   SpO2 99%   BMI 33.84 kg/m   18:28 AM  62 year old Hispanic speaking male with significant history of diabetes, hypertension, hyperlipoidemia, lymphoma, colon cancer, presenting with complaint of abdominal pain.  History obtained using language interpreter.  Patient report for the past 3 days he has had intermittent mid abdominal pain.  Pain is described as a sharp sensation radiates towards his back.  Pain is moderate intensity not associate with eating or drinking.  No fever or chills no chest pain or shortness of breath or productive cough no nausea vomiting diarrhea last bowel movement was this morning and was normal no urinary problem no blood in urine.  He mention he has had similar symptoms like this in the past and was told that he had a hernia.  At home he is trying taking Tylenol for his symptoms without relief.  His oncologist is Dr. Mosetta Putt  On exam, patient is laying in bed resting comfortably appears to be in no acute discomfort.  Heart with normal rate and rhythm, lungs clear to auscultation bilaterally abdomen is distended with tenderness to mid abdomen no guarding no rebound tenderness.  Bowel sounds present.  Vital signs notable for elevated blood pressure of 153/79.  Patient is afebrile no hypoxia.  Patient would likely benefit from advanced imaging including abdominal pelvis CT scan for further assessment.  Blood work ordered.  Will provide supportive care including morphine  -Labs ordered, independently viewed and interpreted by me.  Labs remarkable for reassuring labs.  CBG 232 without DKA -The patient was maintained on a cardiac monitor.  I personally viewed and interpreted the cardiac monitored which showed an underlying rhythm of: NSR -Imaging independently viewed and interpreted by me and I agree with radiologist's interpretation.  Result remarkable for  abd/pelvis CT without acute finding -This patient presents to the ED for concern of abd pain, this involves an extensive number of treatment options, and is a complaint that carries with it a high risk of complications and morbidity.  The differential diagnosis includes GERD, gastritis, colitis, pancreatitis, cholecystitis, appendicitis -Co morbidities that complicate the patient evaluation includes hx of CA -Treatment includes morphine and GI cocktail -Reevaluation of the patient after these medicines showed that the patient improved -PCP office notes or outside notes reviewed -Escalation to admission/observation considered: patients feels much better, is comfortable with discharge, and will follow up with PCP -Prescription medication considered, patient comfortable with protonix -Social  Determinant of Health considered which includes language barrier  Workup overall reassuring.  Patient with improvement of symptoms after receiving morphine and GI cocktail.  CT scan of the abdomen pelvis without any concerning finding.  Suspect elements of gastritis/GERD causing his discomfort.  Will discharge home with Protonix and recommend outpatient follow-up.  Return precaution given.        Final Clinical Impression(s) / ED Diagnoses Final diagnoses:  Epigastric pain    Rx / DC Orders ED Discharge Orders          Ordered    pantoprazole (PROTONIX) 20 MG tablet  Daily        09/11/23 1316              Fayrene Helper, [name] 09/11/23 1316    Arby Barrette, MD 09/11/23 1625

## 2023-09-11 NOTE — ED Notes (Signed)
Patient transported to CT 

## 2023-09-11 NOTE — ED Triage Notes (Signed)
Pt complaining of upper quadrant  abd pain that's been present for apprx 1 wk that radiates around his back intermittently. Pt seen at an urgent care on 10/2 and received some treatment without relief. Pt was dx with a hernia about 1 month ago, and was told he needed surgery for it. Last BM yesterday, denies N/V/D.

## 2023-09-11 NOTE — Discharge Instructions (Addendum)
You have been evaluated for your symptoms.  Fortunately CT scan today did not show any concerning finding.  Your symptom may be due to heartburn.  Take medication prescribed.  Monitoring of blood sugar closely and follow-up with your doctor for further care.  Return if you have any concern.

## 2023-10-14 ENCOUNTER — Ambulatory Visit (INDEPENDENT_AMBULATORY_CARE_PROVIDER_SITE_OTHER): Payer: Medicare HMO | Admitting: Student

## 2023-10-14 ENCOUNTER — Encounter: Payer: Self-pay | Admitting: Student

## 2023-10-14 ENCOUNTER — Encounter: Payer: Medicare HMO | Admitting: Pharmacist

## 2023-10-14 ENCOUNTER — Other Ambulatory Visit: Payer: Self-pay | Admitting: Family Medicine

## 2023-10-14 ENCOUNTER — Encounter: Payer: Self-pay | Admitting: Pharmacist

## 2023-10-14 VITALS — BP 136/80 | HR 66 | Ht 62.0 in | Wt 191.0 lb

## 2023-10-14 DIAGNOSIS — E1165 Type 2 diabetes mellitus with hyperglycemia: Secondary | ICD-10-CM

## 2023-10-14 LAB — POCT GLYCOSYLATED HEMOGLOBIN (HGB A1C): HbA1c, POC (controlled diabetic range): 7.7 % — AB (ref 0.0–7.0)

## 2023-10-14 MED ORDER — ACCU-CHEK SOFTCLIX LANCETS MISC
12 refills | Status: DC
Start: 2023-10-14 — End: 2023-10-14

## 2023-10-14 MED ORDER — METFORMIN HCL 1000 MG PO TABS
1000.0000 mg | ORAL_TABLET | Freq: Two times a day (BID) | ORAL | 3 refills | Status: DC
Start: 2023-10-14 — End: 2024-03-01

## 2023-10-14 MED ORDER — GLIPIZIDE ER 10 MG PO TB24: 10.0000 mg | ORAL_TABLET | Freq: Every day | ORAL | 0 refills | Status: DC

## 2023-10-14 MED ORDER — ACCU-CHEK GUIDE VI STRP
ORAL_STRIP | 12 refills | Status: DC
Start: 2023-10-14 — End: 2023-10-14

## 2023-10-14 MED ORDER — EMPAGLIFLOZIN 25 MG PO TABS
25.0000 mg | ORAL_TABLET | Freq: Every day | ORAL | 2 refills | Status: DC
Start: 2023-10-14 — End: 2024-01-07

## 2023-10-14 MED ORDER — ACCU-CHEK GUIDE W/DEVICE KIT: 1.0000 | PACK | Freq: Every day | 0 refills | Status: AC

## 2023-10-14 NOTE — Research (Signed)
 S:     Chief Complaint  Patient presents with  . Medication Management    Liberate 6 month end of  study   62 y.o. male who presents for diabetes evaluation, education, and management in the context of the LIBERATE study.  This visit was added to my schedule today as patient has been overdue to planned follow-up. Patient reports he had to leave country for emergency in [place] during 3 month planned visit.  Additionally, patient reports he lost his phone and is unable use CGM app.    Patient was also seen by Primary Care Provider, Dr. Elliot Gurney today.  At last visit, for Liberate study, medications were reviewed and adjusted, sensors were placed and education was provided. Unfortunately patient has not followed up during the subsequent 3 months for multiple reasons.    Today, patient arrives in fair spirits and presents without  any assistance. Entire visit was conducted with help of video interpreter - Thyra Breed.   Current diabetes medications include: Jardiance (empagliflozin) 10mg , Glipizide XL 10mg  daily and Metformin  1000mg  BID.   Patient denies adherence with medications, reports recently running out of all medications and requests refills to restart his diabetes medications.    Do you feel that your medications are working for you? yes  Patient denies hypoglycemic events.   O:   Review of Systems  All other systems reviewed and are negative.   Physical Exam Vitals reviewed.  Pulmonary:     Effort: Pulmonary effort is normal.  Neurological:     Mental Status: He is alert.  Psychiatric:        Mood and Affect: Mood normal.        Behavior: Behavior normal.        Thought Content: Thought content normal.   Lab Results  Component Value Date   HGBA1C 7.7 (A) 10/14/2023   Lipid Panel     Component Value Date/Time   CHOL 272 (H) 07/09/2022 1041   TRIG 389 (H) 07/09/2022 1041   HDL 47 07/09/2022 1041   CHOLHDL 5.8 (H) 07/09/2022 1041   CHOLHDL 7.5 (H) 04/02/2016  1229   VLDL NOT CALC 04/02/2016 1229   LDLCALC 152 (H) 07/09/2022 1041   LDLDIRECT 138 (H) 11/21/2011 0957    Clinical Atherosclerotic Cardiovascular Disease (ASCVD):  The 10-year ASCVD risk score (Arnett DK, et al., 2019) is: 26.8%   Values used to calculate the score:     Age: 54 years     Sex: Male     Is Non-Hispanic African American: No     Diabetic: Yes     Tobacco smoker: No     Systolic Blood Pressure: 136 mmHg     Is BP treated: No     HDL Cholesterol: 47 mg/dL     Total Cholesterol: 272 mg/dL    A/P:  LIBERATE Study:  - End of study visit - 6 month post-initiation.   Diabetes longstanding currently slight improvement in glucose control since start of Libre 3 Liberate study.  Medication adherence appears fair until recently when he ran out of his medications.  Control remains suboptimal. -Increased dose of SGLT2-I Jardiance (empagliflozin) from 10 to 25mg  daily. Counseled on sick day rules. -Continued metformin 1000mg  BID -Continued Glipizide XL 10mg  daily  -Extensively discussed pathophysiology of diabetes, recommended lifestyle interventions, dietary effects on blood sugar control.    Written patient instructions provided. Patient verbalized understanding of treatment plan.  Total time in face to face counseling 27 minutes.  Follow-up:  Pharmacist PRN. PCP clinic visit 12/19 with Dr. Elliot Gurney Patient seen with Lendon Ka, PharmD Candidate and Shona Simpson, PharmD Candidate. Marland Kitchen

## 2023-10-14 NOTE — Assessment & Plan Note (Addendum)
Much improved diabetes with A1c of 7.7 today.  Looks to be tolerating his current regimen and with good compliance.  Exam completed today was normal no sign of lesions and good palpable pulse bilaterally.  Continue patient on medication regimen and increase his Jardiance from 10 mg daily to 25 mg daily.  Will continue to work with Dr. Raymondo Band for CGM study.  Lipid panel to assess need for statin. -Increase Jardiance to 25 mg -Continue metformin 1000 mg daily, loperamide 10 mg daily -Encourage continued diet and and improving exercise -Obtain lab for lipid panel, BMP. -Follow-up in 3 months with Dr. Raymondo Band

## 2023-10-14 NOTE — Progress Notes (Signed)
    SUBJECTIVE:   CHIEF COMPLAINT / HPI:   Diabetes, Type 2 - Last A1c - 8.6 from 5 months ago - Medications: Metformin 1000 mg BID glipizide 10 mg QD, Jardiance 10 mg QD - Compliance: Good - Checking BG at home: No stopped using the CGM due to the alarms - Diet: tries to eat more vegetable and egg. Occasional [employer] - Exercise: Not active  - Eye exam: Due.  - Foot exam: Due  - Microalbumin: Due - Statin: Not on statin - Denies symptoms of hypoglycemia, polyuria, polydipsia, numbness extremities, foot ulcers/trauma   PERTINENT  PMH / PSH: Reviewed  OBJECTIVE:   BP 136/80   Pulse 66   Ht 5\' 2"  (1.575 m)   Wt 191 lb (86.6 kg)   SpO2 100%   BMI 34.93 kg/m    Physical Exam General: Alert, well appearing, NAD Cardiovascular: RRR, No Murmurs, Normal S2/S2 Respiratory: CTAB, No wheezing or Rales Abdomen: No distension or tenderness Extremities: No edema on extremities   Skin: Warm and dry Diabetic foot exam: Good pulses bilaterally, sensation intact and no noted lesions.  ASSESSMENT/PLAN:   Type 2 diabetes mellitus with hyperglycemia (HCC) Much improved diabetes with A1c of 7.7 today.  Looks to be tolerating his current regimen and with good compliance.  Exam completed today was normal no sign of lesions and good palpable pulse bilaterally.  Continue patient on medication regimen and increase his Jardiance from 10 mg daily to 25 mg daily.  Will continue to work with Dr. Raymondo Band for CGM study.  Lipid panel to assess need for statin. -Increase Jardiance to 25 mg -Continue metformin 1000 mg daily, loperamide 10 mg daily -Encourage continued diet and and improving exercise -Obtain lab for lipid panel, BMP. -Follow-up in 3 months with Dr. Donalda Ewings, MD [facility]

## 2023-10-14 NOTE — Patient Instructions (Addendum)
Fue maravilloso verte hoy. Gracias por permitirme ser parte de su cuidado. A continuacin se muestra un breve resumen de lo que discutimos en su visita de hoy:  Su A1c hoy ha mejorado mucho a 7,7.  Hoy obtendremos un laboratorio para Facilities manager colesterol y funcin renal.  l continuar tomando sus medicamentos y seguir el plan segn lo discutido con el Dr. Raymondo Band.  Tambin hemos aumentado su dosis de Jardiance a 25 mg al [employer].  Tambin se han repuesto sus medicamentos.  Si tiene alguna pregunta o inquietud, no dude en comunicarse con nosotros por telfono o mensaje de MyChart.   Dr. Cammy [facility]   It was wonderful to see you today. Thank you for allowing me to be a part of your care. Below is a short summary of what we discussed at your visit today:  Your A1c today is much improved to 7.7.  Today we will obtain a lab to check your cholesterol and kidney function.  He is continue to take your medication and follow plan as discussed with Dr. Raymondo Band.  Also we have increased your also with increased your Jardiance to 25 mg daily.  Also your medications has been refilled.  If you have any questions or concerns, please do not hesitate to contact us via phone or MyChart message.   Jerre Simon, MD [facility]

## 2023-10-14 NOTE — Patient Instructions (Addendum)
  Fue agradable verte hoy!  Hoy es el final del estudio Liberate LIBRE 3.   Su A1c mejor de 8,3 a 7,7 en los ltimos 6 meses.   Su objetivo de Production assistant, radio es 80-130 antes de comer y [place] de 180 despus de comer.  Cambios de medicacin:  Airline pilot (empagliflozina) de 10 mg diarios a una nueva dosis de 25 mg diarios  Continuar con [employer].    Controle los niveles de Banker en casa y lleve un registro (glucmetro o una hoja de papel) para llevarlo a su prxima visita.  Infrmenos si tiene sntomas de niveles bajos de Banker.   Contine con el buen trabajo con dieta y ejercicio. Trate de llevar una dieta rica en verduras, frutas y carnes magras (pollo, pavo, pescado). Trate de limitar el consumo de sal comiendo verduras frescas o congeladas (en lugar de enlatadas), enjuague las verduras enlatadas antes de cocinarlas y no agregue sal adicional a las comidas.     It was nice to see you today!  Today is the end of the Liberate LIBRE 3 study.   Your A1c improved from 8.3 to 7.7 over the last 6 months.   Your goal blood sugar is 80-130 before eating and less than 180 after eating.  Medication Changes:  Increase Jardiance (empagliflozin) from 10mg  daily to new dose 25mg  daily  Continue all other medications   Monitor blood sugars at home and keep a log (glucometer or piece of paper) to bring with you to your next visit.  Please let us know if you have symptoms of low blood sugar   Keep up the good work with diet and exercise. Aim for a diet full of vegetables, fruit and lean meats (chicken, [place], fish). Try to limit salt intake by eating fresh or frozen vegetables (instead of canned), rinse canned vegetables prior to cooking and do not add any additional salt to meals.

## 2023-10-15 LAB — MICROALBUMIN / CREATININE URINE RATIO
Creatinine, Urine: 126.7 mg/dL
Microalb/Creat Ratio: 649 mg/g{creat} — ABNORMAL HIGH (ref 0–29)
Microalbumin, Urine: 822.6 ug/mL

## 2023-10-15 LAB — LIPID PANEL
Chol/HDL Ratio: 5.6 ratio — ABNORMAL HIGH (ref 0.0–5.0)
Cholesterol, Total: 236 mg/dL — ABNORMAL HIGH (ref 100–199)
HDL: 42 mg/dL (ref >39)
LDL Chol Calc (NIH): 141 mg/dL — ABNORMAL HIGH (ref 0–99)
Triglycerides: 294 mg/dL — ABNORMAL HIGH (ref 0–149)
VLDL Cholesterol Cal: 53 mg/dL — ABNORMAL HIGH (ref 5–40)

## 2023-10-15 LAB — BASIC METABOLIC PANEL
BUN/Creatinine Ratio: 20 (ref 10–24)
BUN: 16 mg/dL (ref 8–27)
CO2: 23 mmol/L (ref 20–29)
Calcium: 9.4 mg/dL (ref 8.6–10.2)
Chloride: 103 mmol/L (ref 96–106)
Creatinine, Ser: 0.81 mg/dL (ref 0.76–1.27)
Glucose: 170 mg/dL — ABNORMAL HIGH (ref 70–99)
Potassium: 4.8 mmol/L (ref 3.5–5.2)
Sodium: 139 mmol/L (ref 134–144)
eGFR: 100 mL/min/{1.73_m2} (ref >59)

## 2023-10-15 NOTE — Assessment & Plan Note (Signed)
LIBERATE Study:  - End of study visit - 6 month post-initiation.   Diabetes longstanding currently slight improvement in glucose control since start of Libre 3 Liberate study.  Medication adherence appears fair until recently when he ran out of his medications.  Control remains suboptimal. -Increased dose of SGLT2-I Jardiance (empagliflozin) from 10 to 25mg  daily. Counseled on sick day rules. -Continued metformin 1000mg  BID -Continued Glipizide XL 10mg  daily  -Extensively discussed pathophysiology of diabetes, recommended lifestyle interventions, dietary effects on blood sugar control.

## 2023-10-16 DIAGNOSIS — H348122 Central retinal vein occlusion, left eye, stable: Secondary | ICD-10-CM | POA: Diagnosis not present

## 2023-10-19 NOTE — Progress Notes (Signed)
Reviewed and agree with Dr Koval's plan.   

## 2023-10-19 NOTE — Progress Notes (Unsigned)
Patient Care Team: Glendale Chard, DO as PCP - General (Family Medicine) Andria Meuse, MD as Consulting Physician (Colon and Rectal Surgery) Malachy Mood, MD as Consulting Physician (Hematology)   CHIEF COMPLAINT: Follow up colon cancer and remote h/o follicular lymphona  Oncology History Overview Note  Cancer Staging Cancer of right colon [facility]) Staging form: Colon and Rectum, AJCC 8th Edition - Pathologic stage from 10/21/2018: Stage IVC (pT4b, pN1a, pM1c) - Signed by Malachy Mood, MD on 11/13/2018 Total positive nodes: 1 Histologic grading system: 4 grade system Histologic grade (G): G2 Residual tumor (R): R0 - None Sites of metastasis: Peritoneum Lymph-vascular invasion (LVI): LVI present/identified, NOS  Follicular lymphoma (HCC), History of Staging form: Lymphoid Neoplasms, AJCC 6th Edition - Clinical: Stage II - Signed by Si Gaul, MD on 02/01/2014     Follicular lymphoma [facility]), History of  11/2009 Initial Diagnosis   Follicular lymphoma (HCC), History of    Chemotherapy   1) Status post 6 cycles of systemic chemotherapy with CHOP/Rituxan last dose given 05/01/2009.  2) Maintenance Rituxan at 375 mg per meter square given every 2 months status post 12 cycles    Cancer of right colon (HCC)  10/21/2018 Surgery   Exploratory laparotomy right hemicolectomy by Dr. Cliffton Asters and Dr. Magnus Ivan  10/21/18   10/21/2018 Pathology Results   Diagnosis 10/21/18 Colon, segmental resection for tumor, right ascending and appendix - ADENOCARCINOMA, MODERATE TO POORLY DIFFERENTIATED (4 CM) - METASTATIC CARCINOMA INVOLVING ONE OF EIGHTEEN LYMPH NODES (1/18) - CARCINOMA EXTENDS INTO THE APPENDIX - TWO TUMOR DEPOSITS PRESENT - SEE ONCOLOGY TABLE AND COMMENT BELOW   10/21/2018 Cancer Staging   Staging form: Colon and Rectum, AJCC 8th Edition - Pathologic stage from 10/21/2018: Stage IVC (pT4b, pN1a, pM1c) - Signed by Malachy Mood, MD on 11/13/2018   11/04/2018 Imaging   CT AP W  Contrast 11/04/18  IMPRESSION: 1. Interval appendectomy. There is residual soft tissue thickening and stranding in the right lower quadrant adjacent to the cecum which may represent ongoing inflammation versus postsurgical changes. A small soft tissue density adjacent to the surgical sutures may reflect small hematoma or operative collection. No large focal fluid collection to suggest drainable abscess allowing for absence of contrast 2. Fluid-filled colon without wall thickening, could reflect diarrheal process   11/08/2018 Imaging   CT Chest W Contrast 11/08/18  IMPRESSION: 1. No acute findings are noted in the thorax to account for the patient's symptoms. 2. Aortic atherosclerosis, in addition to left main and 3 vessel coronary artery disease. Please note that although the presence of coronary artery calcium documents the presence of coronary artery disease, the severity of this disease and any potential stenosis cannot be assessed on this non-gated CT examination. Assessment for potential risk factor modification, dietary therapy or pharmacologic therapy may be warranted, if clinically indicated. 3. Additional incidental findings, as above. Aortic Atherosclerosis (ICD10-I70.0).   11/12/2018 Initial Diagnosis   Cancer of right colon (HCC)   11/25/2018 - 05/05/2019 Chemotherapy   FOLFOX q2weeks starting 11/25/18. Due to moderate thrombocytopenia, I will stop 5-FU bolus, and reduce pump infusion to 2200mg /m2 starting with cycle 7.  Plan for last treatment on 05/05/19.    02/10/2019 Imaging   CT AP W Contrast   IMPRESSION: 1. No evidence metastatic disease. 2. Hepatic steatosis. 3. Mildly enlarged prostate. 4. Aortic atherosclerosis (ICD10-170.0). coronary artery calcification.   09/02/2019 Imaging   CT AP W Contrast IMPRESSION: 1. No signs to suggest metastatic disease in the abdomen or pelvis.  2. Aortic atherosclerosis these, as well as right coronary artery disease.    03/01/2020 Imaging   CT CAP W Contrast  IMPRESSION: 1. Stable exam. No new or progressive findings to suggest recurrent or metastatic disease. 2. Status post right hemicolectomy. 3. Aortic Atherosclerosis (ICD10-I70.0).   09/07/2020 Imaging   CT AP  IMPRESSION: 1. Status post right hemicolectomy, without recurrent or metastatic disease. 2. Coronary artery atherosclerosis. Aortic Atherosclerosis (ICD10-I70.0).   09/24/2021 Imaging   CT CAP  IMPRESSION: 1. Status post partial right hemicolectomy and ileocolic anastomosis. 2. No evidence of recurrent or metastatic disease in the chest, abdomen, or pelvis. 3. Mild, diffuse bilateral bronchial wall thickening, consistent with nonspecific infectious or inflammatory bronchitis. 4. Hepatic steatosis. 5. Prostatomegaly. Thickening of the decompressed urinary bladder, likely secondary to chronic outlet obstruction. 6. Coronary artery disease.   Aortic Atherosclerosis (ICD10-I70.0).      CURRENT THERAPY: Surveillance   INTERVAL HISTORY Blake Vaughn returns for follow up as scheduled. Last seen by me 04/14/23. He was referred to Dr. Barbaraann Cao for CIPN. Developed abdominal pain in October, a CT was negative   ROS   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   Qualifier: Diagnosis of  By: Arlyce Dice MD, Barbette Hair    GI bleed 11/05/2018   Hypercholesterolemia    Hypertension 11/27/2011   Lymphoma (HCC)    gets annual chemo last tx Feb 2013   TOBACCO USE, QUIT 08/18/2009   Qualifier: Diagnosis of  By: Lanier Prude  MD, Cathrine Muster       Past Surgical History:  Procedure Laterality Date   ESOPHAGOGASTRODUODENOSCOPY (EGD) WITH PROPOFOL N/A 11/05/2018   Procedure: ESOPHAGOGASTRODUODENOSCOPY (EGD) WITH PROPOFOL;  Surgeon: Bernette Redbird, MD;  Location: [facility] ENDOSCOPY;  Service: Endoscopy;  Laterality: N/A;   IR IMAGING GUIDED PORT INSERTION  11/24/2018   IR  REMOVAL TUN ACCESS W/ PORT W/O FL MOD SED  11/11/2021   LAPAROSCOPIC APPENDECTOMY N/A 10/21/2018   Procedure: Exploratory laparotomy right hemicolectomy;  Surgeon: Andria Meuse, MD;  Location: [facility] OR;  Service: General;  Laterality: N/A;     Outpatient Encounter Medications as of 10/21/2023  Medication Sig   Accu-Chek Softclix Lancets lancets USE  TO CHECK GLUCOSE 2 TO 3 TIMES DAILY   amitriptyline (ELAVIL) 25 MG tablet Take 1 tablet (25 mg total) by mouth at bedtime.   Blood Glucose Monitoring Suppl (ACCU-CHEK GUIDE) w/Device KIT 1 kit by Does not apply route daily.   empagliflozin (JARDIANCE) 25 MG TABS tablet Take 1 tablet (25 mg total) by mouth daily.   glipiZIDE (GLUCOTROL XL) 10 MG 24 hr tablet Take 1 tablet (10 mg total) by mouth daily with breakfast.   glucose blood (ACCU-CHEK GUIDE) test strip USE 1 STRIP TO CHECK GLUCOSE ONCE DAILY   metFORMIN (GLUCOPHAGE) 1000 MG tablet Take 1 tablet (1,000 mg total) by mouth 2 (two) times daily with a meal.   naproxen (NAPROSYN) 500 MG tablet Take 1 tablet (500 mg total) by mouth 2 (two) times daily as needed for headache.   pantoprazole (PROTONIX) 20 MG tablet Take 1 tablet (20 mg total) by mouth daily.   No facility-administered encounter medications on file as of 10/21/2023.     There were no vitals filed for this visit. There is no height or weight on file to calculate BMI.   PHYSICAL EXAM GENERAL:alert, no distress and comfortable SKIN: no rash  EYES: sclera clear NECK: without  mass LYMPH:  no palpable cervical or supraclavicular lymphadenopathy  LUNGS: clear with normal breathing effort HEART: regular rate & rhythm, no lower extremity edema ABDOMEN: abdomen soft, non-tender and normal bowel sounds NEURO: alert & oriented x 3 with fluent speech, no focal motor/sensory deficits Breast exam:  PAC without erythema    CBC    Component Value Date/Time   WBC 7.3 09/11/2023 0925   RBC 5.04 09/11/2023 0925   HGB 15.1 09/11/2023  0925   HGB 15.7 04/14/2023 0857   HGB 14.6 07/09/2022 1041   HGB 15.2 09/09/2016 1338   HCT 45.1 09/11/2023 0925   HCT 43.0 07/09/2022 1041   HCT 42.4 09/09/2016 1338   PLT 177 09/11/2023 0925   PLT 179 04/14/2023 0857   PLT 154 07/09/2022 1041   MCV 89.5 09/11/2023 0925   MCV 91 07/09/2022 1041   MCV 85.1 09/09/2016 1338   MCH 30.0 09/11/2023 0925   MCHC 33.5 09/11/2023 0925   RDW 13.2 09/11/2023 0925   RDW 12.3 07/09/2022 1041   RDW 12.6 09/09/2016 1338   LYMPHSABS 1.7 04/14/2023 0857   LYMPHSABS 0.8 (L) 09/09/2016 1338   MONOABS 0.5 04/14/2023 0857   MONOABS 0.1 09/09/2016 1338   EOSABS 0.2 04/14/2023 0857   EOSABS 0.0 09/09/2016 1338   BASOSABS 0.0 04/14/2023 0857   BASOSABS 0.0 09/09/2016 1338     CMP     Component Value Date/Time   NA 139 10/14/2023 1041   NA 133 (L) 09/09/2016 1338   K 4.8 10/14/2023 1041   K 5.1 09/09/2016 1338   CL 103 10/14/2023 1041   CL 102 01/26/2013 0805   CO2 23 10/14/2023 1041   CO2 20 (L) 09/09/2016 1338   GLUCOSE 170 (H) 10/14/2023 1041   GLUCOSE 232 (H) 09/11/2023 0925   GLUCOSE 293 (H) 09/09/2016 1338   GLUCOSE 268 (H) 01/26/2013 0805   BUN 16 10/14/2023 1041   BUN 18.9 09/09/2016 1338   CREATININE 0.81 10/14/2023 1041   CREATININE 0.99 04/14/2023 0857   CREATININE 1.0 09/09/2016 1338   CALCIUM 9.4 10/14/2023 1041   CALCIUM 9.8 09/09/2016 1338   PROT 6.3 (L) 09/11/2023 0925   PROT 5.9 (L) 07/16/2017 1502   PROT 6.7 09/09/2016 1338   ALBUMIN 3.7 09/11/2023 0925   ALBUMIN 4.0 07/16/2017 1502   ALBUMIN 3.6 09/09/2016 1338   AST 20 09/11/2023 0925   AST 18 04/14/2023 0857   AST 14 09/09/2016 1338   ALT 19 09/11/2023 0925   ALT 20 04/14/2023 0857   ALT 22 09/09/2016 1338   ALKPHOS 61 09/11/2023 0925   ALKPHOS 83 09/09/2016 1338   BILITOT 0.6 09/11/2023 0925   BILITOT 1.0 04/14/2023 0857   BILITOT 0.40 09/09/2016 1338   GFRNONAA >60 09/11/2023 0925   GFRNONAA >60 04/14/2023 0857   GFRNONAA >89 04/02/2016 1229   GFRAA  >60 09/07/2020 1413   GFRAA >89 04/02/2016 1229     ASSESSMENT & PLAN:  PLAN:  No orders of the defined types were placed in this encounter.     All questions were answered. The patient knows to call the clinic with any problems, questions or concerns. No barriers to learning were detected. I spent *** counseling the patient face to face. The total time spent in the appointment was *** and more than 50% was on counseling, review of test results, and coordination of care.   Blake Glad, NP-C @DATE @

## 2023-10-20 ENCOUNTER — Other Ambulatory Visit: Payer: Self-pay

## 2023-10-20 DIAGNOSIS — C182 Malignant neoplasm of ascending colon: Secondary | ICD-10-CM

## 2023-10-21 ENCOUNTER — Inpatient Hospital Stay (HOSPITAL_BASED_OUTPATIENT_CLINIC_OR_DEPARTMENT_OTHER): Payer: Medicare HMO | Admitting: Nurse Practitioner

## 2023-10-21 ENCOUNTER — Encounter: Payer: Self-pay | Admitting: Nurse Practitioner

## 2023-10-21 ENCOUNTER — Inpatient Hospital Stay: Payer: Medicare HMO | Attending: Nurse Practitioner

## 2023-10-21 VITALS — BP 127/74 | HR 77 | Temp 97.9°F | Resp 14 | Wt 190.8 lb

## 2023-10-21 DIAGNOSIS — E1140 Type 2 diabetes mellitus with diabetic neuropathy, unspecified: Secondary | ICD-10-CM | POA: Insufficient documentation

## 2023-10-21 DIAGNOSIS — Z79899 Other long term (current) drug therapy: Secondary | ICD-10-CM | POA: Diagnosis not present

## 2023-10-21 DIAGNOSIS — D696 Thrombocytopenia, unspecified: Secondary | ICD-10-CM | POA: Diagnosis not present

## 2023-10-21 DIAGNOSIS — Z7984 Long term (current) use of oral hypoglycemic drugs: Secondary | ICD-10-CM | POA: Insufficient documentation

## 2023-10-21 DIAGNOSIS — K219 Gastro-esophageal reflux disease without esophagitis: Secondary | ICD-10-CM | POA: Diagnosis not present

## 2023-10-21 DIAGNOSIS — R6889 Other general symptoms and signs: Secondary | ICD-10-CM

## 2023-10-21 DIAGNOSIS — C182 Malignant neoplasm of ascending colon: Secondary | ICD-10-CM

## 2023-10-21 DIAGNOSIS — C786 Secondary malignant neoplasm of retroperitoneum and peritoneum: Secondary | ICD-10-CM | POA: Diagnosis not present

## 2023-10-21 DIAGNOSIS — C180 Malignant neoplasm of cecum: Secondary | ICD-10-CM | POA: Diagnosis not present

## 2023-10-21 LAB — CMP (CANCER CENTER ONLY)
ALT: 17 U/L (ref 0–44)
AST: 16 U/L (ref 15–41)
Albumin: 4.1 g/dL (ref 3.5–5.0)
Alkaline Phosphatase: 69 U/L (ref 38–126)
Anion gap: 6 (ref 5–15)
BUN: 30 mg/dL — ABNORMAL HIGH (ref 8–23)
CO2: 26 mmol/L (ref 22–32)
Calcium: 9.7 mg/dL (ref 8.9–10.3)
Chloride: 103 mmol/L (ref 98–111)
Creatinine: 1.12 mg/dL (ref 0.61–1.24)
GFR, Estimated: >60 mL/min (ref >60)
Glucose, Bld: 217 mg/dL — ABNORMAL HIGH (ref 70–99)
Potassium: 4.6 mmol/L (ref 3.5–5.1)
Sodium: 135 mmol/L (ref 135–145)
Total Bilirubin: 0.6 mg/dL (ref <1.2)
Total Protein: 7 g/dL (ref 6.5–8.1)

## 2023-10-21 LAB — CBC WITH DIFFERENTIAL (CANCER CENTER ONLY)
Abs Immature Granulocytes: 0.01 10*3/uL (ref 0.00–0.07)
Basophils Absolute: 0 10*3/uL (ref 0.0–0.1)
Basophils Relative: 0 %
Eosinophils Absolute: 0.4 10*3/uL (ref 0.0–0.5)
Eosinophils Relative: 6 %
HCT: 47.4 % (ref 39.0–52.0)
Hemoglobin: 16.2 g/dL (ref 13.0–17.0)
Immature Granulocytes: 0 %
Lymphocytes Relative: 29 %
Lymphs Abs: 2 10*3/uL (ref 0.7–4.0)
MCH: 29.9 pg (ref 26.0–34.0)
MCHC: 34.2 g/dL (ref 30.0–36.0)
MCV: 87.6 fL (ref 80.0–100.0)
Monocytes Absolute: 0.4 10*3/uL (ref 0.1–1.0)
Monocytes Relative: 6 %
Neutro Abs: 4.1 10*3/uL (ref 1.7–7.7)
Neutrophils Relative %: 59 %
Platelet Count: 191 10*3/uL (ref 150–400)
RBC: 5.41 MIL/uL (ref 4.22–5.81)
RDW: 12.6 % (ref 11.5–15.5)
WBC Count: 6.9 10*3/uL (ref 4.0–10.5)
nRBC: 0 % (ref 0.0–0.2)

## 2023-10-21 LAB — FERRITIN: Ferritin: 106 ng/mL (ref 24–336)

## 2023-10-21 LAB — CEA (ACCESS): CEA (CHCC): 2.36 ng/mL (ref 0.00–5.00)

## 2023-11-26 ENCOUNTER — Encounter: Payer: Self-pay | Admitting: Student

## 2023-11-26 ENCOUNTER — Ambulatory Visit (INDEPENDENT_AMBULATORY_CARE_PROVIDER_SITE_OTHER): Payer: Medicare HMO | Admitting: Student

## 2023-11-26 VITALS — BP 140/81 | HR 76 | Ht 62.0 in | Wt 186.4 lb

## 2023-11-26 DIAGNOSIS — E785 Hyperlipidemia, unspecified: Secondary | ICD-10-CM | POA: Diagnosis not present

## 2023-11-26 DIAGNOSIS — Z1211 Encounter for screening for malignant neoplasm of colon: Secondary | ICD-10-CM | POA: Diagnosis not present

## 2023-11-26 DIAGNOSIS — E1169 Type 2 diabetes mellitus with other specified complication: Secondary | ICD-10-CM | POA: Diagnosis not present

## 2023-11-26 MED ORDER — AMITRIPTYLINE HCL 25 MG PO TABS: 25.0000 mg | ORAL_TABLET | Freq: Every day | ORAL | 5 refills | Status: DC

## 2023-11-26 MED ORDER — ATORVASTATIN CALCIUM 20 MG PO TABS
20.0000 mg | ORAL_TABLET | Freq: Every day | ORAL | 3 refills | Status: AC
Start: 2023-11-26 — End: ?

## 2023-11-26 NOTE — Assessment & Plan Note (Addendum)
Well-controlled on current medication regimen with improved A1c last month and reassuring home  blood glucose readings.  No hypoglycemia or hyperglycemic episodes with tolerance to medication.  Patient will continue his medication as prescribed.because continue dieting and exercising. Follow-up in 2 months.

## 2023-11-26 NOTE — Patient Instructions (Addendum)
Qu bueno verte hoy.  Asegrese de continuar tomando sus medicamentos segn lo recetado.  Tus niveles de colesterol estaban altos.  Y para prevenir el riesgo de ataque cardaco, le he iniciado el tratamiento con Lipitor, que debe tomar 20 mg [employer].  Para ayudar a reducir su colesterol.  Actualmente usted debe tener culebrilla.  Puede recibir la vacuna contra la culebrilla en cualquier farmacia.  Seguimiento en 2 meses.    Great to see you today.  Please make sure to continue taking your medications as prescribed.  Your cholesterol levels were high.  And to prevent risk of heart attack I have started you on Lipitor which you take 20 mg daily.  To help reduce your cholesterol.  You are currently due for shingles.  You can get your shingles vaccine at any pharmacy.  Follow-up in 2 months.

## 2023-11-26 NOTE — Progress Notes (Signed)
    SUBJECTIVE:   CHIEF COMPLAINT / HPI: Type 2 diabetes   62 year old male resenting for diabetes follow-up Last A1c a month ago 7.7 On metformin 1000mg  BID, Glipizide 10mg  daily and Jardiance 25 mg daily Home Blood glucose range 95-130 No hyperglycemia and hypoglycemic episodes. Not on any statin at the moment  Last lipid panel a month ago showed LDL (141), HDL (42), triglycerides (294).   PERTINENT  PMH / PSH: Reviewed  OBJECTIVE:   BP (!) 140/81   Pulse 76   Ht 5\' 2"  (1.575 m)   Wt 186 lb 6.4 oz (84.6 kg)   SpO2 100%   BMI 34.09 kg/m    Physical Exam General: Alert, well appearing, NAD Cardiovascular: RRR, No Murmurs, Normal S2/S2 Respiratory: CTAB, No wheezing or Rales Abdomen: No distension or tenderness Extremities: No edema on extremities     ASSESSMENT/PLAN:   Type 2 diabetes mellitus with hyperglycemia (HCC) Well-controlled on current medication regimen with improved A1c last month and reassuring home  blood glucose readings.  No hypoglycemia or hyperglycemic episodes with tolerance to medication.  Patient will continue his medication as prescribed.because continue dieting and exercising. Follow-up in 2 months.  Hyperlipidemia associated with type 2 diabetes mellitus (HCC) Patient with history of type 2 diabetes not on statin. Last lipid panel a month ago showed LDL (141), HDL (42), triglycerides (294).   To reduce his risk of ACS and other cardiovascular etiologies will start patient on statin. -Rx atorvastatin 20 mg daily    Blake Simon, MD [facility]

## 2023-11-26 NOTE — Assessment & Plan Note (Addendum)
Patient with history of type 2 diabetes not on statin. Last lipid panel a month ago showed LDL (141), HDL (42), triglycerides (294).   To reduce his risk of ACS and other cardiovascular etiologies will start patient on statin. -Rx atorvastatin 20 mg daily

## 2023-12-25 ENCOUNTER — Telehealth: Payer: Self-pay | Admitting: Student

## 2023-12-25 DIAGNOSIS — E1165 Type 2 diabetes mellitus with hyperglycemia: Secondary | ICD-10-CM

## 2023-12-25 MED ORDER — GLIPIZIDE ER 10 MG PO TB24: 10.0000 mg | ORAL_TABLET | Freq: Every day | ORAL | 3 refills | Status: AC

## 2023-12-25 NOTE — Telephone Encounter (Signed)
Patient came in stating that he needs a refill on his Glipizide please, wants it sent to the [place] at [facility].

## 2024-01-07 ENCOUNTER — Telehealth: Payer: Self-pay

## 2024-01-07 DIAGNOSIS — E1165 Type 2 diabetes mellitus with hyperglycemia: Secondary | ICD-10-CM

## 2024-01-07 MED ORDER — EMPAGLIFLOZIN 25 MG PO TABS
25.0000 mg | ORAL_TABLET | Freq: Every day | ORAL | 3 refills | Status: AC
Start: 2024-01-07 — End: 2025-01-01

## 2024-01-07 NOTE — Telephone Encounter (Signed)
Representative from [facility] calls nurse line in regards to [place].   She is requesting a 90 day supply sent to [facility]. She reports this is more cost effective for the patient.   Advised will forward to PCP.

## 2024-01-27 ENCOUNTER — Ambulatory Visit: Payer: Medicare HMO | Admitting: Student

## 2024-01-29 ENCOUNTER — Ambulatory Visit: Payer: Medicare HMO | Admitting: Student

## 2024-02-16 ENCOUNTER — Encounter: Payer: Self-pay | Admitting: Student

## 2024-02-16 ENCOUNTER — Ambulatory Visit (INDEPENDENT_AMBULATORY_CARE_PROVIDER_SITE_OTHER): Admitting: Student

## 2024-02-16 VITALS — BP 129/79 | HR 76 | Wt 186.8 lb

## 2024-02-16 DIAGNOSIS — E1165 Type 2 diabetes mellitus with hyperglycemia: Secondary | ICD-10-CM | POA: Diagnosis not present

## 2024-02-16 DIAGNOSIS — G629 Polyneuropathy, unspecified: Secondary | ICD-10-CM

## 2024-02-16 LAB — POCT GLYCOSYLATED HEMOGLOBIN (HGB A1C): HbA1c, POC (controlled diabetic range): 7.9 % — AB (ref 0.0–7.0)

## 2024-02-16 MED ORDER — TRULICITY 0.75 MG/0.5ML ~~LOC~~ SOAJ: 0.7500 mg | SUBCUTANEOUS | 3 refills | Status: DC

## 2024-02-16 NOTE — Progress Notes (Signed)
    SUBJECTIVE:   CHIEF COMPLAINT / HPI:   Blake Vaughn is a 63 y.o. male  presenting for diabetes follow up.   Type 2 Diabetes: Home medications include: Glipizide 10 mg, Jardiance 25 mg, metformin 1000 mg 2 times daily. Does endorse compliance. Home glucose monitoring is not performed.   Most recent A1Cs:  Lab Results  Component Value Date   HGBA1C 7.9 (A) 02/16/2024   HGBA1C 7.7 (A) 10/14/2023   Last Microalbumin, LDL, Creatinine: Lab Results  Component Value Date   LDLCALC 141 (H) 10/14/2023   CREATININE 1.12 10/21/2023    Patient is not up to date on diabetic eye. Patient is up to date on diabetic foot exam.  Hand Pain:  Hands are numb in the morning, when he does work throughout the day it gets better.  Gotten worse overtime. Talked to oncologist who said it was due to his chemotherapy. Medication was discussed for the headache and he takes the pill and the pain goes away in his hands.  He reports his pain is well-controlled  PERTINENT  PMH / PSH: Reviewed and updated   OBJECTIVE:   BP 129/79   Pulse 76   Wt 186 lb 12.8 oz (84.7 kg)   SpO2 97%   BMI 34.17 kg/m   Well-appearing, no acute distress Cardio: Regular rate, regular rhythm, no murmurs on exam. Pulm: Clear, no wheezing, no crackles. No increased work of breathing Abdominal: bowel sounds present, soft, non-tender, non-distended Extremities: no peripheral edema, strength intact 5+ for handgrip Neuro: alert and oriented x3, speech normal in content, no facial asymmetry, strength intact and equal bilaterally in UE and LE, pupils equal and reactive to light.  Psych:  Cognition and judgment appear intact. Alert, communicative  and cooperative with normal attention span and concentration. No apparent delusions, illusions, hallucinations      02/16/2024    9:29 AM 10/14/2023    9:32 AM 10/16/2022    9:12 AM  PHQ9 SCORE ONLY  PHQ-9 Total Score 0 0 0      ASSESSMENT/PLAN:   Type 2 diabetes mellitus  with hyperglycemia (HCC) A1c continues to uptrend on Jardiance 25 mg, glipizide 10 mg and metformin 1000 mg 2 times daily.  Patient will need additional medication to control his diabetes.  Prescribe Trulicity weekly.  Patient would benefit from GLP-1 for his diabetes and also concurrent obesity with BMI of 34.  Discussed with patient that he can get the medication from his pharmacy and bring it to his appointment with the pharmacist in 2 weeks to receive education on how to administer the medication.  He does not check his blood curves at home due to his neuropathy  Neuropathy Patient reports his neuropathy has been well-controlled on as needed naproxen as prescribed by his oncologist.  No changes to medications today.     Glendale Chard, DO [facility]

## 2024-02-16 NOTE — Assessment & Plan Note (Addendum)
 A1c continues to uptrend on Jardiance 25 mg, glipizide 10 mg and metformin 1000 mg 2 times daily.  Patient will need additional medication to control his diabetes.  Prescribe Trulicity weekly.  Patient would benefit from GLP-1 for his diabetes and also concurrent obesity with BMI of 34.  Discussed with patient that he can get the medication from his pharmacy and bring it to his appointment with the pharmacist in 2 weeks to receive education on how to administer the medication.  He does not check his blood curves at home due to his neuropathy

## 2024-02-16 NOTE — Assessment & Plan Note (Signed)
 Patient reports his neuropathy has been well-controlled on as needed naproxen as prescribed by his oncologist.  No changes to medications today.

## 2024-02-16 NOTE — Patient Instructions (Addendum)
 Your A1c today has become more elevated on your current medications.  This means that I am going to add a another medication for you to take to treat your diabetes.  This medicine can be expensive so I am going to have our office talk to your insurance to make sure it is approved.  You are scheduled with our pharmacist in 2 weeks.  When you get this medicine please hold onto it you can take it to your appointment with the pharmacist and they will help you with teaching you how to administer it.  Hoy, su nivel de A1c se ha elevado con sus medicamentos actuales. Esto significa que voy a agregarle otro medicamento para tratar su diabetes. Este medicamento puede ser costoso, por lo que har que nuestro consultorio hable con su seguro para asegurarme de que est aprobado. Tiene una cita con nuestro farmacutico en 2 semanas. Cuando reciba [employer], gurdelo para llevarlo a su cita con el farmacutico y le ayudarn a ensearle cmo administrarlo.  Future Appointments  Date Time Provider Department Center  03/01/2024  9:00 AM Kathrin Ruddy, RPH-CPP [facility]  10/19/2024 10:30 AM [facility] None  10/19/2024 11:00 AM Pollyann Samples, NP [facility] None   [facility]    For your pain you can try several over the counter topical medications. Topical medications are a great option to treat pain as you can place the medication right where you are hurting and they have less side effects.   Voltaren Gel/Diclofenac Gel: this is an anti-inflammatory cream (same ingredient as Motrin/Ibuprofen/Aleve) can be applied up to 4 times per day.     Lidocaine patch: can be applied for 12 hours and after needs a 12 hour break to continue to be effective. Works by using numbing medication to help with pain relief. Can be found at any pharmacy over the counter or on [email or web address]    Capsaicin cream: made from the same ingredient that makes hot peppers spicy, this works by numbing up the  area of pain. Please wash your hands and do not touch your eyes after applying the cream as active ingredient can hurt your eyes.

## 2024-03-01 ENCOUNTER — Ambulatory Visit (INDEPENDENT_AMBULATORY_CARE_PROVIDER_SITE_OTHER): Admitting: Pharmacist

## 2024-03-01 ENCOUNTER — Encounter: Payer: Self-pay | Admitting: Pharmacist

## 2024-03-01 VITALS — BP 156/79 | Wt 184.2 lb

## 2024-03-01 DIAGNOSIS — E1165 Type 2 diabetes mellitus with hyperglycemia: Secondary | ICD-10-CM | POA: Diagnosis not present

## 2024-03-01 DIAGNOSIS — I10 Essential (primary) hypertension: Secondary | ICD-10-CM

## 2024-03-01 MED ORDER — METFORMIN HCL ER 500 MG PO TB24: 2000.0000 mg | ORAL_TABLET | Freq: Every day | ORAL | 3 refills | Status: AC

## 2024-03-01 NOTE — Assessment & Plan Note (Signed)
 Elevated blood pressure in office again today. Blood pressure goal of <130/80 mmHg.  Potentially white coat HTN as multiple times elevated in office in the past.  -Patient reports BP at [facility] a few days ago "controlled"  -Consider starting a BP agent if patient remains hypertensive at next visit - BP today were 145/93 then 156/79 after rechecking

## 2024-03-01 NOTE — Progress Notes (Signed)
 S:     Chief Complaint  Patient presents with   Medication Management    Diabetes Education   63 y.o. male who presents for diabetes evaluation, education, and management. Patient arrives in good spirits and presents without any assistance. Blake Vaughn helped translate, translator number [phone].  Patient was referred and last seen by Primary Care Provider, Dr. Hyacinth Meeker, on 02/16/2024.   PMH is significant for HLD, T2DM.  At last visit, patient was started on Trulicity (dulaglutide) 0.75 mg weekly.   Patient reports Diabetes was diagnosed in 2009.   Family/Social History: mother and father had diabetes  Current diabetes medications include: metformin 1000 mg bid, Jardiance (empagliflozin) 25 mg daily, Trulicity (dulaglutide) 0.75 mg weekly Current hyperlipidemia medications include: atorvastatin 20 mg daily  Patient reports adherence to taking all medications as prescribed.  Also reports out-of-office blood pressure readings ([facility]) of "controlled" He could not share the number values.    Do you feel that your medications are working for you? yes Have you been experiencing any side effects to the medications prescribed? no Insurance coverage: [employer]  O:   Review of Systems  All other systems reviewed and are negative.   Physical Exam Vitals reviewed.  Pulmonary:     Effort: Pulmonary effort is normal.  Psychiatric:        Mood and Affect: Mood normal.        Thought Content: Thought content normal.        Judgment: Judgment normal.       Lab Results  Component Value Date   HGBA1C 7.9 (A) 02/16/2024   Vitals:   03/01/24 0904  BP: (!) 145/93    Lipid Panel     Component Value Date/Time   CHOL 236 (H) 10/14/2023 1041   TRIG 294 (H) 10/14/2023 1041   HDL 42 10/14/2023 1041   CHOLHDL 5.6 (H) 10/14/2023 1041   CHOLHDL 7.5 (H) 04/02/2016 1229   VLDL NOT CALC 04/02/2016 1229   LDLCALC 141 (H) 10/14/2023 1041   LDLDIRECT 138 (H) 11/21/2011 0957     Clinical Atherosclerotic Cardiovascular Disease (ASCVD): No  The 10-year ASCVD risk score (Arnett DK, et al., 2019) is: 30.1%   Values used to calculate the score:     Age: 89 years     Sex: Male     Is Non-Hispanic African American: No     Diabetic: Yes     Tobacco smoker: No     Systolic Blood Pressure: 145 mmHg     Is BP treated: No     HDL Cholesterol: 42 mg/dL     Total Cholesterol: 236 mg/dL    A/P: Diabetes longstanding currently controlled and stable. Patient is able to verbalize appropriate hypoglycemia management plan. Medication adherence appears adherent.  -Initiate GLP-1 Trulicity (dulaglutide) 0.75 mg weekly. Patient educated on purpose, proper use and potential adverse effects of nausea.  Following instruction patient verbalized understanding of treatment plan. Patient was able to demonstrate appropriate technique while administering first dose in office. -Continued SGLT2-I Jardiance (empagliflozin) 25 mg daily -Changed metformin 1000 mg bid (immediate release) to metformin XR 500 mg- 4 tablets with breakfast -Continued glipizide XL 10 mg daily. If BG <80 advised patient instructed to stop this medication -Called [facility] to refill his lancets and test strips (refills on file) - patient informed that Diabetes supplies would be available for pick-up.  Requested him to check glucose 4-5 times per week with varied times  -Extensively discussed pathophysiology of diabetes, recommended lifestyle  interventions, dietary effects on blood sugar control.  -Counseled on s/sx of and management of hypoglycemia.  -Next A1c anticipated 05/2024.   ASCVD risk - primary prevention in patient with diabetes. Last LDL is not at goal of <70 mg/dL. ASCVD risk factors include T2DM, HLD and 10-year ASCVD risk score of 25.3%. high intensity statin indicated.  -Continued atorvastatin 20 mg nightly.   Elevated blood pressure in office again today. Blood pressure goal of <130/80 mmHg.  Potentially  white coat HTN as multiple times elevated in office in the past.  -Patient reports BP at [facility] a few days ago "controlled"  -Consider starting a BP agent if patient remains hypertensive at next visit - BP today were 145/93 then 156/79 after rechecking  Written patient instructions provided. Patient verbalized understanding of treatment plan.  Total time in face to face counseling 36 minutes.    Follow-up:  PCP clinic visit in 04/05/2024 Patient seen with Threasa Heads, PharmD Candidate and Mack Guise, PharmD Candidate.

## 2024-03-01 NOTE — Patient Instructions (Addendum)
  Qu bueno verte hoy!  Tu nivel ideal de azcar en sangre es de 80 a 130 antes de comer y menos de 180 despus de comer.  Cambios en la medicacin: COMIENZA a tomar 4 comprimidos de metformina XR por la maana con el desayuno.  Suspende la metformina de 1000 mg [employer].  Contina con todos los dems medicamentos de la misma forma.  Controla tu nivel de Production assistant, radio en casa y lleva un registro (glucmetro o papel) para llevar a tu prxima consulta.  Sigue con la buena alimentacin y el ejercicio. Intenta llevar una dieta rica en verduras, frutas y carnes magras (pollo, pavo, pescado). Intenta limitar el consumo de sal comiendo verduras frescas o congeladas (en lugar de enlatadas), enjuaga las verduras enlatadas antes de cocinarlas y no aadas sal a las comidas.   It was nice to see you today!  Your goal blood sugar is 80-130 before eating and less than 180 after eating.  Medication Changes: START metformin XR 4 tablets in the morning with breakfast  Discontinue metformin 1000 mg twice daily  Continue all other medication the same.   Monitor blood sugars at home and keep a log (glucometer or piece of paper) to bring with you to your next visit.  Keep up the good work with diet and exercise. Aim for a diet full of vegetables, fruit and lean meats (chicken, [place], fish). Try to limit salt intake by eating fresh or frozen vegetables (instead of canned), rinse canned vegetables prior to cooking and do not add any additional salt to meals.

## 2024-03-01 NOTE — Assessment & Plan Note (Signed)
 Diabetes longstanding currently controlled and stable. Patient is able to verbalize appropriate hypoglycemia management plan. Medication adherence appears adherent.  -Initiate GLP-1 Trulicity (dulaglutide) 0.75 mg weekly. Patient educated on purpose, proper use and potential adverse effects of nausea.  Following instruction patient verbalized understanding of treatment plan. Patient was able to demonstrate appropriate technique while administering first dose in office. -Continued SGLT2-I Jardiance (empagliflozin) 25 mg daily -Changed metformin 1000 mg bid (immediate release) to metformin XR 500 mg- 4 tablets with breakfast -Continued glipizide XL 10 mg daily. If BG <80 advised patient instructed to stop this medication -Called [facility] to refill his lancets and test strips (refills on file) - patient informed that Diabetes supplies would be available for pick-up.  Requested him to check glucose 4-5 times per week with varied times  -Extensively discussed pathophysiology of diabetes, recommended lifestyle interventions, dietary effects on blood sugar control.  -

## 2024-03-02 NOTE — Progress Notes (Signed)
 Reviewed and agree with Dr Macky Lower plan.

## 2024-03-03 NOTE — Progress Notes (Signed)
 Reviewed and agree with Dr Macky Lower plan.

## 2024-03-15 LAB — HM DIABETES EYE EXAM

## 2024-03-29 ENCOUNTER — Encounter: Payer: Self-pay | Admitting: Hematology

## 2024-03-29 ENCOUNTER — Other Ambulatory Visit: Payer: Self-pay

## 2024-03-29 ENCOUNTER — Telehealth: Payer: Self-pay

## 2024-03-29 NOTE — Telephone Encounter (Signed)
 Reached out to the patient via spanish interpretor. Left patient a detailed message explaining that Dr. Maryalice Smaller did not sign his handicap sticker application. Per Dr. Maryalice Smaller, patient is not disabled and is not currently undergoing chemo, and is on surveillance.

## 2024-04-05 ENCOUNTER — Encounter: Payer: Self-pay | Admitting: Student

## 2024-04-05 ENCOUNTER — Ambulatory Visit: Admitting: Student

## 2024-04-05 DIAGNOSIS — E1169 Type 2 diabetes mellitus with other specified complication: Secondary | ICD-10-CM | POA: Diagnosis not present

## 2024-04-05 DIAGNOSIS — E1165 Type 2 diabetes mellitus with hyperglycemia: Secondary | ICD-10-CM

## 2024-04-05 DIAGNOSIS — E785 Hyperlipidemia, unspecified: Secondary | ICD-10-CM

## 2024-04-05 DIAGNOSIS — Z7984 Long term (current) use of oral hypoglycemic drugs: Secondary | ICD-10-CM | POA: Diagnosis not present

## 2024-04-05 NOTE — Assessment & Plan Note (Signed)
 Recheck lipid panel in 2 months, continue Lipitor 20 mg daily

## 2024-04-05 NOTE — Patient Instructions (Addendum)
 Continue taking your medications as prescribed. Stop taking the injection since it was making you sick.   Continue to work on your diet at home and limiting your carbs.   Contine tomando sus medicamentos segn lo recetado. Deje de ponerse la inyeccin, ya que le estaba causando Dentist.  Contine mejorando su dieta en casa y limitando los carbohidratos.

## 2024-04-05 NOTE — Progress Notes (Signed)
    SUBJECTIVE:   CHIEF COMPLAINT / HPI:   Blake Vaughn is a 63 y.o. male presenting for diabetes management and follow-up.  He was recently started on Trulicity 0.75 mg weekly.  He followed up with our clinical pharmacist on 03/01/2024 for medication administration education.  Current medications include Jardiance  25 mg, metformin  1000 mg twice daily, glipizide  10 mg daily and Trulicity 0.75 mg weekly.  Weight remained stable at 183 lbs.  A1c collected 02/16/2024 was 7.9.  He reports taking the shot and then having severe abdominal pain and vomiting. He stopped taking the medication after 3 weeks due to being unable to tolerate.   PERTINENT  PMH / PSH: reviewed and updated.  OBJECTIVE:   BP 134/86   Pulse 82   Ht 5\' 2"  (1.575 m)   Wt 183 lb 9.6 oz (83.3 kg)   SpO2 98%   BMI 33.58 kg/m   well-appearing, no acute distress Cardio: Regular rate, regular rhythm, no murmurs on exam. Pulm: Clear, no wheezing, no crackles. No increased work of breathing Abdominal: bowel sounds present, soft, non-tender, non-distended Extremities: no peripheral edema  Neuro: alert and oriented x3, speech normal in content, no facial asymmetry, strength intact and equal bilaterally in UE and LE, pupils equal and reactive to light.  Psych:  Cognition and judgment appear intact. Alert, communicative  and cooperative with normal attention span and concentration. No apparent delusions, illusions, hallucinations    ASSESSMENT/PLAN:   Assessment & Plan Uncontrolled type 2 diabetes mellitus with hyperglycemia (HCC) Discontinue Trulicity due to side effects Continue Jardiance  25 mg daily, metformin  1000 mg twice daily and glipizide  10 mg daily Decision making with patient to trial lifestyle interventions including decreasing carb intake Will recheck in 2 months with A1c, discussed possibility of insulin  if A1c is not controlled on current medications Hyperlipidemia associated with type 2 diabetes mellitus  (HCC) Recheck lipid panel in 2 months, continue Lipitor 20 mg daily     Clem Currier, DO [facility]

## 2024-04-19 DIAGNOSIS — E113391 Type 2 diabetes mellitus with moderate nonproliferative diabetic retinopathy without macular edema, right eye: Secondary | ICD-10-CM | POA: Diagnosis not present

## 2024-05-18 DIAGNOSIS — E113591 Type 2 diabetes mellitus with proliferative diabetic retinopathy without macular edema, right eye: Secondary | ICD-10-CM | POA: Diagnosis not present

## 2024-06-13 ENCOUNTER — Other Ambulatory Visit: Payer: Self-pay

## 2024-06-13 ENCOUNTER — Other Ambulatory Visit: Payer: Self-pay | Admitting: *Deleted

## 2024-06-13 DIAGNOSIS — G43009 Migraine without aura, not intractable, without status migrainosus: Secondary | ICD-10-CM

## 2024-06-13 MED ORDER — NAPROXEN 500 MG PO TABS: 500.0000 mg | ORAL_TABLET | Freq: Two times a day (BID) | ORAL | 1 refills | Status: AC | PRN

## 2024-06-13 NOTE — Progress Notes (Unsigned)
 Patient stopped by the Cancer [facility] and requested to speak w/ Lacie Burton's nurse.  Patient requesting a refill on Naproxen . Patient was seen: 08/31/23 x Dr. Buckley. Dr. Eward nurse was not available at the time. Let patient know that I would send a message to Dr. Buckley and his team to further assist with the Naproxen  Rx refill.  Confirmed [facility] w/ patient. Patient voiced understanding - patient inquired if he was due for office visit.  Checked with Dr. Eward nurse. Let patient know that he is not due for an appointment and to contact the Cancer Center to f/u w/ Dr. Buckley if the headaches worsen. Patient voiced understanding.

## 2024-06-22 DIAGNOSIS — E113591 Type 2 diabetes mellitus with proliferative diabetic retinopathy without macular edema, right eye: Secondary | ICD-10-CM | POA: Diagnosis not present

## 2024-07-04 DIAGNOSIS — H4312 Vitreous hemorrhage, left eye: Secondary | ICD-10-CM | POA: Diagnosis not present

## 2024-07-29 ENCOUNTER — Telehealth: Payer: Self-pay | Admitting: Nurse Practitioner

## 2024-07-29 NOTE — Telephone Encounter (Signed)
 I LVM with the interpretor on the line informing Blake Vaughn of his re-scheduled appointment on 11/11. I asked that he return my call if he would like to re-scheduled.

## 2024-08-03 DIAGNOSIS — E113512 Type 2 diabetes mellitus with proliferative diabetic retinopathy with macular edema, left eye: Secondary | ICD-10-CM | POA: Diagnosis not present

## 2024-08-03 DIAGNOSIS — H4312 Vitreous hemorrhage, left eye: Secondary | ICD-10-CM | POA: Diagnosis not present

## 2024-08-11 ENCOUNTER — Encounter: Payer: Self-pay | Admitting: Hematology

## 2024-08-11 DIAGNOSIS — H25813 Combined forms of age-related cataract, bilateral: Secondary | ICD-10-CM | POA: Diagnosis not present

## 2024-08-16 ENCOUNTER — Ambulatory Visit (INDEPENDENT_AMBULATORY_CARE_PROVIDER_SITE_OTHER): Admitting: Student

## 2024-08-16 ENCOUNTER — Encounter: Payer: Self-pay | Admitting: Student

## 2024-08-16 VITALS — BP 124/80 | HR 74 | Ht 62.0 in | Wt 185.0 lb

## 2024-08-16 DIAGNOSIS — Z23 Encounter for immunization: Secondary | ICD-10-CM | POA: Diagnosis not present

## 2024-08-16 DIAGNOSIS — E1165 Type 2 diabetes mellitus with hyperglycemia: Secondary | ICD-10-CM | POA: Diagnosis not present

## 2024-08-16 DIAGNOSIS — C182 Malignant neoplasm of ascending colon: Secondary | ICD-10-CM

## 2024-08-16 LAB — POCT GLYCOSYLATED HEMOGLOBIN (HGB A1C): HbA1c, POC (controlled diabetic range): 7 % (ref 0.0–7.0)

## 2024-08-16 NOTE — Assessment & Plan Note (Signed)
 A1c today 7.0, well controlled.  Will not need to start insulin   Continue: Jardiance  25 mg, metformin  1000 mg BID, and glipizide  10 mg daily

## 2024-08-16 NOTE — Patient Instructions (Addendum)
 It was great to see you today!   You are due for a Medicare Annual Wellness Visit. You will receive a phone call from our office to schedule this. This visit is conducted over the phone and is included with your insurance once yearly.    Qu gusto verte hoy!  Tiene programada una visita anual de bienestar de [place]. Recibir una llamada de nuestra oficina para programarla. Esta visita se realiza por telfono y est incluida en su seguro una vez al ao.   Future Appointments  Date Time Provider Department Center  10/18/2024 10:30 AM [facility] None  10/18/2024 11:00 AM Burton, Lacie K, NP [facility] None    Please arrive 15 minutes before your appointment to ensure smooth check in process.    Please call the clinic at [phone] if your symptoms worsen or you have any concerns.  Thank you for allowing me to participate in your care, Dr. Damien Pinal [facility]

## 2024-08-16 NOTE — Progress Notes (Signed)
    SUBJECTIVE:   CHIEF COMPLAINT / HPI:   Blake Vaughn is a 63 y.o. male presenting for diabetes follow up.   T2DM:  At last visit, discontinued Trulicity  d/t SE  He is currently on Jardiance  25 mg, metformin  1000 mg BID, and glipizide  10 mg daily.  Here for A1c recheck and possibly starting insulin .  He has been feeling well and has been working on his diet.   PERTINENT  PMH / PSH: reviewed and updated.  OBJECTIVE:   BP 124/80   Pulse 74   Ht 5' 2 (1.575 m)   Wt 185 lb (83.9 kg)   SpO2 98%   BMI 33.84 kg/m   Well-appearing, no acute distress Cardio: Regular rate, regular rhythm, no murmurs on exam. Pulm: Clear, no wheezing, no crackles. No increased work of breathing Abdominal: bowel sounds present, soft, non-tender, non-distended Extremities: no peripheral edema   ASSESSMENT/PLAN:   Assessment & Plan Type 2 diabetes mellitus with hyperglycemia, unspecified whether long term insulin  use (HCC) A1c today 7.0, well controlled.  Will not need to start insulin   Continue: Jardiance  25 mg, metformin  1000 mg BID, and glipizide  10 mg daily Cancer of right colon [facility]) Referral for repeat colonoscopy sent.    Due for medicare AWV, message to front office sent.   Damien Pinal, DO [facility]

## 2024-08-16 NOTE — Assessment & Plan Note (Signed)
 Referral for repeat colonoscopy sent.

## 2024-09-09 ENCOUNTER — Encounter: Payer: Self-pay | Admitting: Nurse Practitioner

## 2024-09-09 ENCOUNTER — Ambulatory Visit (INDEPENDENT_AMBULATORY_CARE_PROVIDER_SITE_OTHER): Payer: Self-pay | Admitting: Nurse Practitioner

## 2024-09-09 ENCOUNTER — Encounter: Payer: Self-pay | Admitting: Hematology

## 2024-09-09 VITALS — BP 144/78 | HR 79 | Ht 64.17 in | Wt 183.6 lb

## 2024-09-09 DIAGNOSIS — H547 Unspecified visual loss: Secondary | ICD-10-CM

## 2024-09-09 NOTE — Patient Instructions (Signed)
 [facility] (accepts Medicaid, Medicare, [employer], and/or Self-Pay) [facility]  [address], [address],  [place], [place] [address] [phone]  [facility]  [address] [place], [place] [address] [phone]  [facility]  [facility] [place] [address] [place], [place] [address] *Located next to [facility]  [phone]  [facility]  [address] [place], [place] [address]  *Located next to [employer] [phone]  [facility]  [address] [place], [place] [address] [phone]  [facility]  [address] [place], [place] [address]  [phone] *Located inside [facility]  [address] [place], [place] [address] [phone]  [facility] [address] [place], [place] [address] [phone] [address] [place], [place] [address] [phone]  [facility] [address] [place], [place] [address] [phone]  [facility]  [address] [place], [place] [address] [phone]  [facility]  [address] [place], [place] [address] [phone]

## 2024-09-09 NOTE — Progress Notes (Signed)
 Blake Vaughn                                          MRN: [id number]   09/09/2024   The [facility] Quality Team Specialist reviewed this patient medical record for the purposes of chart review for care gap closure. The following were reviewed: chart review for care gap closure-kidney health evaluation for diabetes:eGFR  and uACR.    [facility]BCI Quality Team

## 2024-09-09 NOTE — Progress Notes (Signed)
 Subjective   Patient ID: Blake Vaughn, male    DOB: 1961-07-30, 63 y.o.   MRN: [id number]  Chief Complaint  Patient presents with   New Patient (Initial Visit)   Loss of Vision    In left eye patient states he can only see a little bit and the right eye he states that he see  webbs    Referring provider: Cleotilde Perkins, DO  Blake Vaughn is a 63 y.o. male with Past Medical History: 10/21/2018: Appendicitis No date: Diabetes mellitus 12/27/2008: Follicular lymphoma (HCC), History of     Comment:  Qualifier: Diagnosis of  By: Debrah MD, Lamar BIRCH  11/05/2018: GI bleed No date: Hypercholesterolemia 11/27/2011: Hypertension No date: Lymphoma [facility])     Comment:  gets annual chemo last tx Feb 2013 08/18/2009: TOBACCO USE, QUIT     Comment:  Qualifier: Diagnosis of  By: Lucille  MD, Taineisha     HPI  Patient presents today for a referral to optometry.  He states that he does have decreased vision and has been seeing spots in webs.  He does currently wear glasses but does need an updated eye exam.  Referral was placed today in office and a list of eye doctors was given to the patient in office today.  Interpreter was used for this visit today. Denies f/c/s, n/v/d, hemoptysis, PND, leg swelling Denies chest pain or edema      Allergies  Allergen Reactions   Iohexol  Other (See Comments)     Code: HIVES, Desc: pt had itching 8 hrs after 6/10 scan;and now was today 08/16/09 was given 50mg  benadryl  1 hr before scan and he did fine.Amy Rogal, Onset Date: [id number] *PLEASE HAVE PT TAKE 50MG  OF BENADRYL  1HR PRIOR TO SCAN*     Immunization History  Administered Date(s) Administered   Influenza, Seasonal, Injecte, Preservative Fre 08/16/2024   Influenza,inj,Quad PF,6+ Mos 01/03/2020, 01/22/2021   Janssen (J&J) SARS-COV-2 Vaccination 05/23/2020   PFIZER Comirnaty(Gray Top)Covid-19 Tri-Sucrose Vaccine 01/22/2021   PFIZER(Purple Top)SARS-COV-2 Vaccination 02/06/2020, 03/22/2020    PNEUMOCOCCAL CONJUGATE-20 11/27/2023   Pneumococcal Conjugate Pcv21, Polysaccharide Crm197 Conjugaf 11/27/2023   Tdap 06/02/2023    Tobacco History: Social History   Tobacco Use  Smoking Status Former   Current packs/day: 0.00   Average packs/day: 0.5 packs/day for 30.0 years (15.0 ttl pk-yrs)   Types: Cigarettes   Start date: 12/08/1974   Quit date: 12/08/2004   Years since quitting: 19.7  Smokeless Tobacco Former   Quit date: 11/20/2006   Counseling given: Not Answered   Outpatient Encounter Medications as of 09/09/2024  Medication Sig   Accu-Chek Softclix Lancets lancets USE  TO CHECK GLUCOSE 2 TO 3 TIMES DAILY   amitriptyline  (ELAVIL ) 25 MG tablet Take 1 tablet (25 mg total) by mouth at bedtime.   atorvastatin  (LIPITOR) 20 MG tablet Take 1 tablet (20 mg total) by mouth daily.   Blood Glucose Monitoring Suppl (ACCU-CHEK GUIDE) w/Device KIT 1 kit by Does not apply route daily.   empagliflozin  (JARDIANCE ) 25 MG TABS tablet Take 1 tablet (25 mg total) by mouth daily.   glipiZIDE  (GLUCOTROL  XL) 10 MG 24 hr tablet Take 1 tablet (10 mg total) by mouth daily with breakfast.   glucose blood (ACCU-CHEK GUIDE) test strip USE 1 STRIP TO CHECK GLUCOSE ONCE DAILY   metFORMIN  (GLUCOPHAGE -XR) 500 MG 24 hr tablet Take 4 tablets (2,000 mg total) by mouth daily with breakfast. Take 4 tablets by mouth daily with breakfast   naproxen  (NAPROSYN )  500 MG tablet Take 1 tablet (500 mg total) by mouth 2 (two) times daily as needed for headache.   No facility-administered encounter medications on file as of 09/09/2024.    Review of Systems  Review of Systems  Constitutional: Negative.   HENT: Negative.    Cardiovascular: Negative.   Gastrointestinal: Negative.   Allergic/Immunologic: Negative.   Neurological: Negative.   Psychiatric/Behavioral: Negative.       Objective:   BP (!) 144/78 (BP Location: Left Arm, Patient Position: Sitting, Cuff Size: Normal)   Pulse 79   Ht 5' 4.17 (1.63 m)    Wt 183 lb 9.6 oz (83.3 kg)   SpO2 100%   BMI 31.35 kg/m   Wt Readings from Last 5 Encounters:  09/09/24 183 lb 9.6 oz (83.3 kg)  08/16/24 185 lb (83.9 kg)  04/05/24 183 lb 9.6 oz (83.3 kg)  03/01/24 184 lb 3.2 oz (83.6 kg)  02/16/24 186 lb 12.8 oz (84.7 kg)     Physical Exam Vitals and nursing note reviewed.  Constitutional:      General: He is not in acute distress.    Appearance: He is well-developed.  Cardiovascular:     Rate and Rhythm: Normal rate and regular rhythm.  Pulmonary:     Effort: Pulmonary effort is normal.     Breath sounds: Normal breath sounds.  Skin:    General: Skin is warm and dry.  Neurological:     Mental Status: He is alert and oriented to person, place, and time.       Assessment & Plan:   Decreased vision -     Ambulatory referral to Optometry     Return if symptoms worsen or fail to improve.   Bascom GORMAN Borer, NP 09/09/2024

## 2024-09-12 ENCOUNTER — Encounter: Payer: Self-pay | Admitting: Hematology

## 2024-09-14 ENCOUNTER — Other Ambulatory Visit (HOSPITAL_COMMUNITY): Payer: Self-pay

## 2024-09-15 ENCOUNTER — Other Ambulatory Visit: Payer: Self-pay

## 2024-10-03 DIAGNOSIS — E11311 Type 2 diabetes mellitus with unspecified diabetic retinopathy with macular edema: Secondary | ICD-10-CM | POA: Diagnosis not present

## 2024-10-16 NOTE — Progress Notes (Unsigned)
 [facility]   [phone] [phone]    Patient Care Team: Cleotilde Perkins, DO as PCP - General (Family Medicine) Teresa Lonni HERO, MD as Consulting Physician (Colon and Rectal Surgery) Lanny Callander, MD as Consulting Physician (Hematology)   CHIEF COMPLAINT: Follow up h/o colon cancer and follicular lymphoma   Oncology History Overview Note  Cancer Staging Cancer of right colon [facility]) Staging form: Colon and Rectum, AJCC 8th Edition - Pathologic stage from 10/21/2018: Stage IVC (pT4b, pN1a, pM1c) - Signed by Lanny Callander, MD on 11/13/2018 Total positive nodes: 1 Histologic grading system: 4 grade system Histologic grade (G): G2 Residual tumor (R): R0 - None Sites of metastasis: Peritoneum Lymph-vascular invasion (LVI): LVI present/identified, NOS  Follicular lymphoma (HCC), History of Staging form: Lymphoid Neoplasms, AJCC 6th Edition - Clinical: Stage II - Signed by Sherrod Sherrod, MD on 02/01/2014     Follicular lymphoma [facility]), History of  11/2009 Initial Diagnosis   Follicular lymphoma (HCC), History of    Chemotherapy   1) Status post 6 cycles of systemic chemotherapy with CHOP/Rituxan  last dose given 05/01/2009.  2) Maintenance Rituxan  at 375 mg per meter square given every 2 months status post 12 cycles    Cancer of right colon (HCC)  10/21/2018 Surgery   Exploratory laparotomy right hemicolectomy by Dr. Teresa and Dr. Vernetta  10/21/18   10/21/2018 Pathology Results   Diagnosis 10/21/18 Colon, segmental resection for tumor, right ascending and appendix - ADENOCARCINOMA, MODERATE TO POORLY DIFFERENTIATED (4 CM) - METASTATIC CARCINOMA INVOLVING ONE OF EIGHTEEN LYMPH NODES (1/18) - CARCINOMA EXTENDS INTO THE APPENDIX - TWO TUMOR DEPOSITS PRESENT - SEE ONCOLOGY TABLE AND COMMENT BELOW   10/21/2018 Cancer Staging   Staging form: Colon and Rectum, AJCC 8th Edition - Pathologic stage from 10/21/2018: Stage IVC (pT4b, pN1a,  pM1c) - Signed by Lanny Callander, MD on 11/13/2018   11/04/2018 Imaging   CT AP W Contrast 11/04/18  IMPRESSION: 1. Interval appendectomy. There is residual soft tissue thickening and stranding in the right lower quadrant adjacent to the cecum which may represent ongoing inflammation versus postsurgical changes. A small soft tissue density adjacent to the surgical sutures may reflect small hematoma or operative collection. No large focal fluid collection to suggest drainable abscess allowing for absence of contrast 2. Fluid-filled colon without wall thickening, could reflect diarrheal process   11/08/2018 Imaging   CT Chest W Contrast 11/08/18  IMPRESSION: 1. No acute findings are noted in the thorax to account for the patient's symptoms. 2. Aortic atherosclerosis, in addition to left main and 3 vessel coronary artery disease. Please note that although the presence of coronary artery calcium  documents the presence of coronary artery disease, the severity of this disease and any potential stenosis cannot be assessed on this non-gated CT examination. Assessment for potential risk factor modification, dietary therapy or pharmacologic therapy may be warranted, if clinically indicated. 3. Additional incidental findings, as above. Aortic Atherosclerosis (ICD10-I70.0).   11/12/2018 Initial Diagnosis   Cancer of right colon (HCC)   11/25/2018 - 05/05/2019 Chemotherapy   FOLFOX q2weeks starting 11/25/18. Due to moderate thrombocytopenia, I will stop 5-FU bolus, and reduce pump infusion to 2200mg /m2 starting with cycle 7.  Plan for last treatment on 05/05/19.    02/10/2019 Imaging   CT AP W Contrast   IMPRESSION: 1. No evidence metastatic disease. 2. Hepatic steatosis. 3. Mildly enlarged prostate. 4. Aortic atherosclerosis (ICD10-170.0). coronary artery calcification.   09/02/2019 Imaging   CT AP  W Contrast IMPRESSION: 1. No signs to suggest metastatic disease in the abdomen or pelvis. 2.  Aortic atherosclerosis these, as well as right coronary artery disease.   03/01/2020 Imaging   CT CAP W Contrast  IMPRESSION: 1. Stable exam. No new or progressive findings to suggest recurrent or metastatic disease. 2. Status post right hemicolectomy. 3. Aortic Atherosclerosis (ICD10-I70.0).   09/07/2020 Imaging   CT AP  IMPRESSION: 1. Status post right hemicolectomy, without recurrent or metastatic disease. 2. Coronary artery atherosclerosis. Aortic Atherosclerosis (ICD10-I70.0).   09/24/2021 Imaging   CT CAP  IMPRESSION: 1. Status post partial right hemicolectomy and ileocolic anastomosis. 2. No evidence of recurrent or metastatic disease in the chest, abdomen, or pelvis. 3. Mild, diffuse bilateral bronchial wall thickening, consistent with nonspecific infectious or inflammatory bronchitis. 4. Hepatic steatosis. 5. Prostatomegaly. Thickening of the decompressed urinary bladder, likely secondary to chronic outlet obstruction. 6. Coronary artery disease.   Aortic Atherosclerosis (ICD10-I70.0).      CURRENT THERAPY: Surveillance   INTERVAL HISTORY Blake Vaughn returns for follow up as scheduled. Last seen by me 10/21/23.  Doing well overall with no significant changes.  He continues to have throat pain and shortness of breath in his throat after about 15 minutes of exertion.  Denies cough, chest pain, dyspnea.  This has been going on for some time.  Neuropathy and headaches are well managed.  Denies change in bowel habits, abdominal pain/bloating, nausea/vomiting, rectal bleeding, fever, night sweats, or unintentional weight loss.  ROS  All other systems reviewed and negative  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   Qualifier: Diagnosis of  By: Debrah MD, Lamar BIRCH    GI bleed 11/05/2018   Hypercholesterolemia    Hypertension 11/27/2011   Lymphoma (HCC)    gets annual  chemo last tx Feb 2013   TOBACCO USE, QUIT 08/18/2009   Qualifier: Diagnosis of  By: Lucille  MD, Preston       Past Surgical History:  Procedure Laterality Date   ESOPHAGOGASTRODUODENOSCOPY (EGD) WITH PROPOFOL  N/A 11/05/2018   Procedure: ESOPHAGOGASTRODUODENOSCOPY (EGD) WITH PROPOFOL ;  Surgeon: Donnald Lamar, MD;  Location: [facility] ENDOSCOPY;  Service: Endoscopy;  Laterality: N/A;   IR IMAGING GUIDED PORT INSERTION  11/24/2018   IR REMOVAL TUN ACCESS W/ PORT W/O FL MOD SED  11/11/2021   LAPAROSCOPIC APPENDECTOMY N/A 10/21/2018   Procedure: Exploratory laparotomy right hemicolectomy;  Surgeon: Teresa Lonni HERO, MD;  Location: [facility] OR;  Service: General;  Laterality: N/A;     Outpatient Encounter Medications as of 10/18/2024  Medication Sig   Accu-Chek Softclix Lancets lancets USE  TO CHECK GLUCOSE 2 TO 3 TIMES DAILY   amitriptyline  (ELAVIL ) 25 MG tablet Take 1 tablet (25 mg total) by mouth at bedtime.   atorvastatin  (LIPITOR) 20 MG tablet Take 1 tablet (20 mg total) by mouth daily.   Blood Glucose Monitoring Suppl (ACCU-CHEK GUIDE) w/Device KIT 1 kit by Does not apply route daily.   diphenhydrAMINE  (BENADRYL ) 50 MG tablet Take 1 tablet 1 hour prior to CT contrast administration. Take with last dose of prednisone    empagliflozin  (JARDIANCE ) 25 MG TABS tablet Take 1 tablet (25 mg total) by mouth daily.   glipiZIDE  (GLUCOTROL  XL) 10 MG 24 hr tablet Take 1 tablet (10 mg total) by mouth daily with breakfast.   glucose blood (ACCU-CHEK GUIDE) test strip USE 1 STRIP TO CHECK GLUCOSE ONCE DAILY   metFORMIN  (  GLUCOPHAGE -XR) 500 MG 24 hr tablet Take 4 tablets (2,000 mg total) by mouth daily with breakfast. Take 4 tablets by mouth daily with breakfast   naproxen  (NAPROSYN ) 500 MG tablet Take 1 tablet (500 mg total) by mouth 2 (two) times daily as needed for headache.   predniSONE  (DELTASONE ) 50 MG tablet Take 1 tablet at 13 hours prior, 7 hours prior, and 1 hour prior to CT contrast administration    No facility-administered encounter medications on file as of 10/18/2024.     Today's Vitals   10/18/24 1103 10/18/24 1112  BP: 116/60   Pulse: 90   Resp: 17   Temp: (!) 97.5 F (36.4 C)   SpO2: 96%   Weight: 185 lb 3.2 oz (84 kg)   PainSc:  5    Body mass index is 31.62 kg/m.   ECOG PERFORMANCE STATUS: 0 - Asymptomatic  PHYSICAL EXAM GENERAL:alert, no distress and comfortable SKIN: no rash  HEENT sclera clear.  No thrush, ulcers, or oral mass.  Neck without mass LYMPH:  no palpable cervical or supraclavicular lymphadenopathy  LUNGS: clear with normal breathing effort HEART: regular rate & rhythm, no lower extremity edema ABDOMEN: abdomen soft, non-tender and normal bowel sounds NEURO: alert & oriented x 3 with fluent speech, no focal motor deficits   CBC    Latest Ref Rng & Units 10/18/2024   10:47 AM 10/21/2023   10:37 AM 09/11/2023    9:25 AM  CBC  WBC 4.0 - 10.5 K/uL 7.8  6.9  7.3   Hemoglobin 13.0 - 17.0 g/dL 83.9  83.7  84.8   Hematocrit 39.0 - 52.0 % 45.9  47.4  45.1   Platelets 150 - 400 K/uL 202  191  177       CMP     Latest Ref Rng & Units 10/18/2024   10:47 AM 10/21/2023   10:37 AM 10/14/2023   10:41 AM  CMP  Glucose 70 - 99 mg/dL 706  782  829   BUN 8 - 23 mg/dL 27  30  16    Creatinine 0.61 - 1.24 mg/dL 9.00  8.87  9.18   Sodium 135 - 145 mmol/L 138  135  139   Potassium 3.5 - 5.1 mmol/L 4.6  4.6  4.8   Chloride 98 - 111 mmol/L 106  103  103   CO2 22 - 32 mmol/L 24  26  23    Calcium  8.9 - 10.3 mg/dL 9.6  9.7  9.4   Total Protein 6.5 - 8.1 g/dL 7.1  7.0    Total Bilirubin 0.0 - 1.2 mg/dL 0.4  0.6    Alkaline Phos 38 - 126 U/L 76  69    AST 15 - 41 U/L 14  16    ALT 0 - 44 U/L 13  17        ASSESSMENT & PLAN:Blake Vaughn is a 63 y.o. male with    Right Cecal adenocarcinoma, pT4bN1aM1c, Stage IVC, with limited peritoneal metastasis, resected, Grade II-III, MMR normal  -Diagnosed in 10/2018. Treated with right hemicolectomy. He was  found to have 2 small peritoneal metastasis during the surgery, which were removed.   -He completed 12 cycles of adjuvant FOLFOX on 05/05/2019.  -CT A/P on 09/07/20 and 09/24/2021, 10/03/2022, and 09/2023 were NED -Surveillance endo/colonoscopy on 05/03/21 under Dr. Donnald at [place]. Pathology showed a tubular adenoma in the transverse colon, otherwise negative. Repeat colonoscopy recommended in 3 years -Blake Vaughn is clinically doing well, exam is  benign, labs are unremarkable.  Overall no clinical concern for colon cancer recurrence -Continue surveillance.  He is now 5 years post completion of adjuvant chemo, we will do 1 more surveillance CT and continue monitoring clinically -Follow-up in 1 year, or sooner if needed   Fatigue, activity intolerance - 10/2023 reported he gets easily fatigued with exertion, gestures to his throat. CBC was normal; no hypoxia or significant tachycardia on 6 min walk test.  I referred to ENT at the time -10/18/2024 he continues to report shortness of breath and pain in his throat after 15-minute of exertion.  Exam is benign.  Will add neck to CT CAP.  If negative I would consider upper endoscopy next   Neuropathy, grade 1 -Likely secondary to Oxaliplatin  chemo and DM -He occasionally drops things and feels off balance, no fall.  Still functions normally for the most part, difficulty with buttons -Exam with decreased peripheral vibratory sense over the fingertips -Gabapentin  was not very helpful and did not refill -Improved and well-managed on amitriptyline , followed by Dr. 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 Buckley   H/o Follicular Lymphoma  -Was treated with CHOP/Rituxan  in 2010. Then he was previously treated with Maintenance Rituxan  completed in 2012.  -Discharged from f/up in 2017. -No B symptoms   DM and Hypercholesterolemia, uncontrolled hyperglycemia, GERD -On metformin , Glimepiride, Lipitor and metoprolol .  -His CT AP from 09/02/19 also shows aortic atherosclerosis, which can increase his risk for MI.        PLAN: -Labs reviewed -Continue colon cancer surveillance -Allergy protocol with surveillance CT CAP + neck for throat sx, will call with results  -Overdue surveillance colonoscopy, cc my note to Dr. Donnald -F/up in 1 year, or sooner if needed   Orders Placed This Encounter  Procedures   CT CHEST ABDOMEN PELVIS W CONTRAST    Standing Status:   Future    Expected Date:   11/01/2024    Expiration Date:   10/18/2025    If indicated for the ordered procedure, I authorize the administration of contrast media per Radiology protocol:   Yes    Does the patient have a contrast media/X-ray dye allergy?:   Yes    Preferred imaging location?:   [facility]    If indicated for the ordered procedure, I authorize the administration of oral contrast media per Radiology protocol:   Yes   CT Soft Tissue Neck W Contrast    Standing Status:   Future    Expected Date:   11/01/2024    Expiration Date:   10/18/2025    If indicated for the ordered procedure, I authorize the administration of contrast media per Radiology protocol:   Yes    Does the patient have a contrast media/X-ray dye allergy?:   Yes    Preferred imaging location?:   [facility]      All questions were answered. The patient knows to call the clinic with any problems, questions or concerns. No barriers to learning were detected. I spent 20 minutes counseling the patient face to face. The total time spent in the appointment was 30 minutes and more  than 50% was  on counseling, review of test results, and coordination of care.   Amrom Ore K Aylen Rambert, NP 10/18/2024

## 2024-10-18 ENCOUNTER — Inpatient Hospital Stay (HOSPITAL_BASED_OUTPATIENT_CLINIC_OR_DEPARTMENT_OTHER): Payer: Self-pay | Admitting: Nurse Practitioner

## 2024-10-18 ENCOUNTER — Encounter: Payer: Self-pay | Admitting: Nurse Practitioner

## 2024-10-18 ENCOUNTER — Inpatient Hospital Stay: Payer: Self-pay | Attending: Nurse Practitioner

## 2024-10-18 VITALS — BP 116/60 | HR 90 | Temp 97.5°F | Resp 17 | Wt 185.2 lb

## 2024-10-18 DIAGNOSIS — R6889 Other general symptoms and signs: Secondary | ICD-10-CM | POA: Diagnosis not present

## 2024-10-18 DIAGNOSIS — Z7984 Long term (current) use of oral hypoglycemic drugs: Secondary | ICD-10-CM | POA: Insufficient documentation

## 2024-10-18 DIAGNOSIS — E1165 Type 2 diabetes mellitus with hyperglycemia: Secondary | ICD-10-CM | POA: Diagnosis not present

## 2024-10-18 DIAGNOSIS — E1140 Type 2 diabetes mellitus with diabetic neuropathy, unspecified: Secondary | ICD-10-CM | POA: Diagnosis not present

## 2024-10-18 DIAGNOSIS — Z8572 Personal history of non-Hodgkin lymphomas: Secondary | ICD-10-CM | POA: Insufficient documentation

## 2024-10-18 DIAGNOSIS — R070 Pain in throat: Secondary | ICD-10-CM | POA: Diagnosis not present

## 2024-10-18 DIAGNOSIS — C182 Malignant neoplasm of ascending colon: Secondary | ICD-10-CM

## 2024-10-18 DIAGNOSIS — E7800 Pure hypercholesterolemia, unspecified: Secondary | ICD-10-CM | POA: Insufficient documentation

## 2024-10-18 DIAGNOSIS — D696 Thrombocytopenia, unspecified: Secondary | ICD-10-CM | POA: Insufficient documentation

## 2024-10-18 DIAGNOSIS — R519 Headache, unspecified: Secondary | ICD-10-CM | POA: Diagnosis not present

## 2024-10-18 DIAGNOSIS — R0602 Shortness of breath: Secondary | ICD-10-CM | POA: Diagnosis not present

## 2024-10-18 DIAGNOSIS — Z79899 Other long term (current) drug therapy: Secondary | ICD-10-CM | POA: Insufficient documentation

## 2024-10-18 DIAGNOSIS — K219 Gastro-esophageal reflux disease without esophagitis: Secondary | ICD-10-CM | POA: Diagnosis not present

## 2024-10-18 DIAGNOSIS — Z85038 Personal history of other malignant neoplasm of large intestine: Secondary | ICD-10-CM | POA: Insufficient documentation

## 2024-10-18 LAB — CBC WITH DIFFERENTIAL (CANCER CENTER ONLY)
Abs Immature Granulocytes: 0.02 K/uL (ref 0.00–0.07)
Basophils Absolute: 0 K/uL (ref 0.0–0.1)
Basophils Relative: 0 %
Eosinophils Absolute: 0.4 K/uL (ref 0.0–0.5)
Eosinophils Relative: 5 %
HCT: 45.9 % (ref 39.0–52.0)
Hemoglobin: 16 g/dL (ref 13.0–17.0)
Immature Granulocytes: 0 %
Lymphocytes Relative: 24 %
Lymphs Abs: 1.9 K/uL (ref 0.7–4.0)
MCH: 30 pg (ref 26.0–34.0)
MCHC: 34.9 g/dL (ref 30.0–36.0)
MCV: 86.1 fL (ref 80.0–100.0)
Monocytes Absolute: 0.5 K/uL (ref 0.1–1.0)
Monocytes Relative: 7 %
Neutro Abs: 4.9 K/uL (ref 1.7–7.7)
Neutrophils Relative %: 64 %
Platelet Count: 202 K/uL (ref 150–400)
RBC: 5.33 MIL/uL (ref 4.22–5.81)
RDW: 13 % (ref 11.5–15.5)
WBC Count: 7.8 K/uL (ref 4.0–10.5)
nRBC: 0 % (ref 0.0–0.2)

## 2024-10-18 LAB — CMP (CANCER CENTER ONLY)
ALT: 13 U/L (ref 0–44)
AST: 14 U/L — ABNORMAL LOW (ref 15–41)
Albumin: 4.1 g/dL (ref 3.5–5.0)
Alkaline Phosphatase: 76 U/L (ref 38–126)
Anion gap: 8 (ref 5–15)
BUN: 27 mg/dL — ABNORMAL HIGH (ref 8–23)
CO2: 24 mmol/L (ref 22–32)
Calcium: 9.6 mg/dL (ref 8.9–10.3)
Chloride: 106 mmol/L (ref 98–111)
Creatinine: 0.99 mg/dL (ref 0.61–1.24)
GFR, Estimated: >60 mL/min (ref >60)
Glucose, Bld: 293 mg/dL — ABNORMAL HIGH (ref 70–99)
Potassium: 4.6 mmol/L (ref 3.5–5.1)
Sodium: 138 mmol/L (ref 135–145)
Total Bilirubin: 0.4 mg/dL (ref 0.0–1.2)
Total Protein: 7.1 g/dL (ref 6.5–8.1)

## 2024-10-18 LAB — FERRITIN: Ferritin: 247 ng/mL (ref 24–336)

## 2024-10-18 LAB — CEA (ACCESS): CEA (CHCC): 2.41 ng/mL (ref 0.00–5.00)

## 2024-10-18 MED ORDER — PREDNISONE 50 MG PO TABS: ORAL_TABLET | ORAL | 0 refills | Status: AC

## 2024-10-18 MED ORDER — DIPHENHYDRAMINE HCL 50 MG PO TABS: ORAL_TABLET | ORAL | 0 refills | Status: AC

## 2024-10-19 ENCOUNTER — Other Ambulatory Visit: Payer: Medicare HMO

## 2024-10-19 ENCOUNTER — Ambulatory Visit: Payer: Medicare HMO | Admitting: Nurse Practitioner

## 2024-10-26 ENCOUNTER — Other Ambulatory Visit: Payer: Self-pay

## 2024-10-26 LAB — OPHTHALMOLOGY REPORT-SCANNED

## 2024-12-05 ENCOUNTER — Other Ambulatory Visit: Payer: Self-pay

## 2024-12-05 ENCOUNTER — Other Ambulatory Visit: Payer: Self-pay | Admitting: Student

## 2024-12-05 MED ORDER — AMITRIPTYLINE HCL 25 MG PO TABS: 25.0000 mg | ORAL_TABLET | Freq: Every day | ORAL | 5 refills | Status: DC

## 2024-12-06 MED ORDER — AMITRIPTYLINE HCL 25 MG PO TABS: 25.0000 mg | ORAL_TABLET | Freq: Every day | ORAL | 5 refills | Status: AC

## 2024-12-06 NOTE — Telephone Encounter (Signed)
 Pt has requested that this go to [facility] on [address].  Will resend now. Harlene Carte, CMA

## 2024-12-06 NOTE — Addendum Note (Signed)
 Addended by: HYLA RAISIN D on: 12/06/2024 11:55 AM   Modules accepted: Orders

## 2025-10-24 ENCOUNTER — Inpatient Hospital Stay

## 2025-10-24 ENCOUNTER — Inpatient Hospital Stay: Admitting: Nurse Practitioner
